# Patient Record
Sex: Male | Born: 1940 | Race: White | Hispanic: No | Marital: Married | Smoking: Former smoker
Health system: Southern US, Community
[De-identification: ages and names within clinical notes are randomized; demographics above are authoritative.]

## PROBLEM LIST (undated history)

## (undated) DIAGNOSIS — I10 Essential (primary) hypertension: Secondary | ICD-10-CM

## (undated) DIAGNOSIS — Z8719 Personal history of other diseases of the digestive system: Secondary | ICD-10-CM

## (undated) DIAGNOSIS — G119 Hereditary ataxia, unspecified: Secondary | ICD-10-CM

## (undated) DIAGNOSIS — Z87442 Personal history of urinary calculi: Secondary | ICD-10-CM

## (undated) HISTORY — PX: TONSILLECTOMY: SUR1361

## (undated) HISTORY — PX: BACK SURGERY: SHX140

## (undated) HISTORY — PX: SHOULDER SURGERY: SHX246

## (undated) HISTORY — PX: FRACTURE SURGERY: SHX138

## (undated) HISTORY — PX: APPENDECTOMY: SHX54

---

## 1983-05-21 HISTORY — PX: KNEE SURGERY: SHX244

## 2001-09-17 ENCOUNTER — Encounter (INDEPENDENT_AMBULATORY_CARE_PROVIDER_SITE_OTHER): Payer: Self-pay | Admitting: Specialist

## 2001-09-17 ENCOUNTER — Ambulatory Visit (HOSPITAL_COMMUNITY): Admission: RE | Admit: 2001-09-17 | Discharge: 2001-09-17 | Payer: Self-pay | Admitting: Gastroenterology

## 2005-06-21 ENCOUNTER — Encounter: Admission: RE | Admit: 2005-06-21 | Discharge: 2005-06-21 | Payer: Self-pay | Admitting: Orthopedic Surgery

## 2009-01-10 ENCOUNTER — Encounter: Admission: RE | Admit: 2009-01-10 | Discharge: 2009-01-10 | Payer: Self-pay | Admitting: Sports Medicine

## 2009-04-28 ENCOUNTER — Inpatient Hospital Stay (HOSPITAL_COMMUNITY): Admission: RE | Admit: 2009-04-28 | Discharge: 2009-05-01 | Payer: Self-pay | Admitting: Neurosurgery

## 2010-08-01 ENCOUNTER — Other Ambulatory Visit: Payer: Self-pay | Admitting: Podiatry

## 2010-08-21 LAB — URINALYSIS, ROUTINE W REFLEX MICROSCOPIC
Bilirubin Urine: NEGATIVE
Bilirubin Urine: NEGATIVE
Glucose, UA: NEGATIVE mg/dL
Glucose, UA: NEGATIVE mg/dL
Hgb urine dipstick: NEGATIVE
Hgb urine dipstick: NEGATIVE
Ketones, ur: NEGATIVE mg/dL
Ketones, ur: NEGATIVE mg/dL
Nitrite: NEGATIVE
Nitrite: NEGATIVE
Protein, ur: NEGATIVE mg/dL
Protein, ur: NEGATIVE mg/dL
Specific Gravity, Urine: 1.023 (ref 1.005–1.030)
Specific Gravity, Urine: 1.023 (ref 1.005–1.030)
Urobilinogen, UA: 0.2 mg/dL (ref 0.0–1.0)
Urobilinogen, UA: 0.2 mg/dL (ref 0.0–1.0)
pH: 5.5 (ref 5.0–8.0)
pH: 7.5 (ref 5.0–8.0)

## 2010-08-21 LAB — PROTIME-INR
INR: 0.98 (ref 0.00–1.49)
Prothrombin Time: 12.9 seconds (ref 11.6–15.2)

## 2010-08-21 LAB — CBC
HCT: 46.2 % (ref 39.0–52.0)
Hemoglobin: 16.3 g/dL (ref 13.0–17.0)
MCHC: 35.3 g/dL (ref 30.0–36.0)
MCV: 96.7 fL (ref 78.0–100.0)
Platelets: 308 10*3/uL (ref 150–400)
RBC: 4.77 MIL/uL (ref 4.22–5.81)
RDW: 12.5 % (ref 11.5–15.5)
WBC: 7.2 10*3/uL (ref 4.0–10.5)

## 2010-08-21 LAB — COMPREHENSIVE METABOLIC PANEL
ALT: 23 U/L (ref 0–53)
AST: 21 U/L (ref 0–37)
Albumin: 4.4 g/dL (ref 3.5–5.2)
Alkaline Phosphatase: 30 U/L — ABNORMAL LOW (ref 39–117)
BUN: 16 mg/dL (ref 6–23)
CO2: 28 mEq/L (ref 19–32)
Calcium: 9.9 mg/dL (ref 8.4–10.5)
Chloride: 104 mEq/L (ref 96–112)
Creatinine, Ser: 0.78 mg/dL (ref 0.4–1.5)
GFR calc Af Amer: >60 mL/min (ref >60)
GFR calc non Af Amer: >60 mL/min (ref >60)
Glucose, Bld: 101 mg/dL — ABNORMAL HIGH (ref 70–99)
Potassium: 4.3 mEq/L (ref 3.5–5.1)
Sodium: 141 mEq/L (ref 135–145)
Total Bilirubin: 0.7 mg/dL (ref 0.3–1.2)
Total Protein: 7 g/dL (ref 6.0–8.3)

## 2010-08-21 LAB — URINE CULTURE
Colony Count: NO GROWTH
Culture: NO GROWTH

## 2010-08-21 LAB — DIFFERENTIAL
Basophils Absolute: 0 10*3/uL (ref 0.0–0.1)
Basophils Relative: 1 % (ref 0–1)
Eosinophils Absolute: 0.1 10*3/uL (ref 0.0–0.7)
Eosinophils Relative: 2 % (ref 0–5)
Lymphocytes Relative: 42 % (ref 12–46)
Lymphs Abs: 3 10*3/uL (ref 0.7–4.0)
Monocytes Absolute: 0.7 10*3/uL (ref 0.1–1.0)
Monocytes Relative: 10 % (ref 3–12)
Neutro Abs: 3.3 10*3/uL (ref 1.7–7.7)
Neutrophils Relative %: 46 % (ref 43–77)

## 2010-08-21 LAB — APTT: aPTT: 27 seconds (ref 24–37)

## 2010-12-24 ENCOUNTER — Emergency Department (HOSPITAL_COMMUNITY): Payer: Medicare Other

## 2010-12-24 ENCOUNTER — Emergency Department (HOSPITAL_COMMUNITY)
Admission: EM | Admit: 2010-12-24 | Discharge: 2010-12-24 | Disposition: A | Discharge status: Home / Self Care | Payer: Medicare Other | Attending: Emergency Medicine | Admitting: Emergency Medicine

## 2010-12-24 DIAGNOSIS — Y93H9 Activity, other involving exterior property and land maintenance, building and construction: Secondary | ICD-10-CM | POA: Insufficient documentation

## 2010-12-24 DIAGNOSIS — M25579 Pain in unspecified ankle and joints of unspecified foot: Secondary | ICD-10-CM | POA: Insufficient documentation

## 2010-12-24 DIAGNOSIS — W010XXA Fall on same level from slipping, tripping and stumbling without subsequent striking against object, initial encounter: Secondary | ICD-10-CM | POA: Insufficient documentation

## 2010-12-24 DIAGNOSIS — Y92009 Unspecified place in unspecified non-institutional (private) residence as the place of occurrence of the external cause: Secondary | ICD-10-CM | POA: Insufficient documentation

## 2010-12-24 DIAGNOSIS — I10 Essential (primary) hypertension: Secondary | ICD-10-CM | POA: Insufficient documentation

## 2010-12-24 DIAGNOSIS — S8263XA Displaced fracture of lateral malleolus of unspecified fibula, initial encounter for closed fracture: Secondary | ICD-10-CM | POA: Insufficient documentation

## 2011-05-22 DIAGNOSIS — M722 Plantar fascial fibromatosis: Secondary | ICD-10-CM | POA: Diagnosis not present

## 2011-05-24 DIAGNOSIS — M722 Plantar fascial fibromatosis: Secondary | ICD-10-CM | POA: Diagnosis not present

## 2011-05-28 DIAGNOSIS — M722 Plantar fascial fibromatosis: Secondary | ICD-10-CM | POA: Diagnosis not present

## 2011-05-30 DIAGNOSIS — M722 Plantar fascial fibromatosis: Secondary | ICD-10-CM | POA: Diagnosis not present

## 2011-06-04 DIAGNOSIS — M722 Plantar fascial fibromatosis: Secondary | ICD-10-CM | POA: Diagnosis not present

## 2011-06-06 DIAGNOSIS — M722 Plantar fascial fibromatosis: Secondary | ICD-10-CM | POA: Diagnosis not present

## 2011-06-11 DIAGNOSIS — M722 Plantar fascial fibromatosis: Secondary | ICD-10-CM | POA: Diagnosis not present

## 2011-06-13 DIAGNOSIS — M722 Plantar fascial fibromatosis: Secondary | ICD-10-CM | POA: Diagnosis not present

## 2011-07-02 DIAGNOSIS — N210 Calculus in bladder: Secondary | ICD-10-CM | POA: Diagnosis not present

## 2011-07-02 DIAGNOSIS — N23 Unspecified renal colic: Secondary | ICD-10-CM | POA: Diagnosis not present

## 2011-07-02 DIAGNOSIS — N200 Calculus of kidney: Secondary | ICD-10-CM | POA: Diagnosis not present

## 2011-07-02 DIAGNOSIS — Z885 Allergy status to narcotic agent status: Secondary | ICD-10-CM | POA: Diagnosis not present

## 2011-07-02 DIAGNOSIS — R3129 Other microscopic hematuria: Secondary | ICD-10-CM | POA: Diagnosis not present

## 2011-07-02 DIAGNOSIS — I10 Essential (primary) hypertension: Secondary | ICD-10-CM | POA: Diagnosis not present

## 2011-07-25 DIAGNOSIS — M25579 Pain in unspecified ankle and joints of unspecified foot: Secondary | ICD-10-CM | POA: Diagnosis not present

## 2011-08-01 DIAGNOSIS — H524 Presbyopia: Secondary | ICD-10-CM | POA: Diagnosis not present

## 2011-08-01 DIAGNOSIS — H5200 Hypermetropia, unspecified eye: Secondary | ICD-10-CM | POA: Diagnosis not present

## 2011-08-01 DIAGNOSIS — H2510 Age-related nuclear cataract, unspecified eye: Secondary | ICD-10-CM | POA: Diagnosis not present

## 2011-08-01 DIAGNOSIS — H52209 Unspecified astigmatism, unspecified eye: Secondary | ICD-10-CM | POA: Diagnosis not present

## 2011-09-30 DIAGNOSIS — M722 Plantar fascial fibromatosis: Secondary | ICD-10-CM | POA: Diagnosis not present

## 2011-10-04 DIAGNOSIS — M722 Plantar fascial fibromatosis: Secondary | ICD-10-CM | POA: Diagnosis not present

## 2011-10-07 DIAGNOSIS — M722 Plantar fascial fibromatosis: Secondary | ICD-10-CM | POA: Diagnosis not present

## 2011-10-21 DIAGNOSIS — M722 Plantar fascial fibromatosis: Secondary | ICD-10-CM | POA: Diagnosis not present

## 2011-10-25 DIAGNOSIS — E785 Hyperlipidemia, unspecified: Secondary | ICD-10-CM | POA: Diagnosis not present

## 2011-10-25 DIAGNOSIS — I10 Essential (primary) hypertension: Secondary | ICD-10-CM | POA: Diagnosis not present

## 2011-10-25 DIAGNOSIS — Z125 Encounter for screening for malignant neoplasm of prostate: Secondary | ICD-10-CM | POA: Diagnosis not present

## 2011-10-28 DIAGNOSIS — M25579 Pain in unspecified ankle and joints of unspecified foot: Secondary | ICD-10-CM | POA: Diagnosis not present

## 2011-11-05 DIAGNOSIS — N138 Other obstructive and reflux uropathy: Secondary | ICD-10-CM | POA: Diagnosis not present

## 2011-11-05 DIAGNOSIS — N401 Enlarged prostate with lower urinary tract symptoms: Secondary | ICD-10-CM | POA: Diagnosis not present

## 2011-11-05 DIAGNOSIS — E785 Hyperlipidemia, unspecified: Secondary | ICD-10-CM | POA: Diagnosis not present

## 2011-11-05 DIAGNOSIS — I10 Essential (primary) hypertension: Secondary | ICD-10-CM | POA: Diagnosis not present

## 2011-11-05 DIAGNOSIS — Z0000 Encounter for general adult medical examination without abnormal findings: Secondary | ICD-10-CM | POA: Diagnosis not present

## 2011-11-11 DIAGNOSIS — S93409A Sprain of unspecified ligament of unspecified ankle, initial encounter: Secondary | ICD-10-CM | POA: Diagnosis not present

## 2011-11-11 DIAGNOSIS — M779 Enthesopathy, unspecified: Secondary | ICD-10-CM | POA: Diagnosis not present

## 2011-11-11 DIAGNOSIS — B351 Tinea unguium: Secondary | ICD-10-CM | POA: Diagnosis not present

## 2011-11-28 DIAGNOSIS — Z1212 Encounter for screening for malignant neoplasm of rectum: Secondary | ICD-10-CM | POA: Diagnosis not present

## 2012-02-10 ENCOUNTER — Other Ambulatory Visit: Payer: Self-pay | Admitting: Dermatology

## 2012-02-10 DIAGNOSIS — D1739 Benign lipomatous neoplasm of skin and subcutaneous tissue of other sites: Secondary | ICD-10-CM | POA: Diagnosis not present

## 2012-02-10 DIAGNOSIS — L570 Actinic keratosis: Secondary | ICD-10-CM | POA: Diagnosis not present

## 2012-02-10 DIAGNOSIS — L439 Lichen planus, unspecified: Secondary | ICD-10-CM | POA: Diagnosis not present

## 2012-02-10 DIAGNOSIS — D485 Neoplasm of uncertain behavior of skin: Secondary | ICD-10-CM | POA: Diagnosis not present

## 2012-02-10 DIAGNOSIS — Z85828 Personal history of other malignant neoplasm of skin: Secondary | ICD-10-CM | POA: Diagnosis not present

## 2012-02-10 DIAGNOSIS — L821 Other seborrheic keratosis: Secondary | ICD-10-CM | POA: Diagnosis not present

## 2012-02-10 DIAGNOSIS — D239 Other benign neoplasm of skin, unspecified: Secondary | ICD-10-CM | POA: Diagnosis not present

## 2012-02-11 DIAGNOSIS — M25519 Pain in unspecified shoulder: Secondary | ICD-10-CM | POA: Diagnosis not present

## 2012-02-19 DIAGNOSIS — Z23 Encounter for immunization: Secondary | ICD-10-CM | POA: Diagnosis not present

## 2012-04-02 DIAGNOSIS — Y831 Surgical operation with implant of artificial internal device as the cause of abnormal reaction of the patient, or of later complication, without mention of misadventure at the time of the procedure: Secondary | ICD-10-CM | POA: Diagnosis not present

## 2012-04-02 DIAGNOSIS — T84498A Other mechanical complication of other internal orthopedic devices, implants and grafts, initial encounter: Secondary | ICD-10-CM | POA: Diagnosis not present

## 2012-04-02 DIAGNOSIS — T8489XA Other specified complication of internal orthopedic prosthetic devices, implants and grafts, initial encounter: Secondary | ICD-10-CM | POA: Diagnosis not present

## 2012-04-27 DIAGNOSIS — G9050 Complex regional pain syndrome I, unspecified: Secondary | ICD-10-CM | POA: Diagnosis not present

## 2012-05-18 DIAGNOSIS — G9050 Complex regional pain syndrome I, unspecified: Secondary | ICD-10-CM | POA: Diagnosis not present

## 2012-05-18 DIAGNOSIS — E785 Hyperlipidemia, unspecified: Secondary | ICD-10-CM | POA: Diagnosis not present

## 2012-05-18 DIAGNOSIS — I10 Essential (primary) hypertension: Secondary | ICD-10-CM | POA: Diagnosis not present

## 2012-05-18 DIAGNOSIS — M1990 Unspecified osteoarthritis, unspecified site: Secondary | ICD-10-CM | POA: Diagnosis not present

## 2012-06-09 DIAGNOSIS — K219 Gastro-esophageal reflux disease without esophagitis: Secondary | ICD-10-CM | POA: Diagnosis not present

## 2012-06-12 DIAGNOSIS — G5760 Lesion of plantar nerve, unspecified lower limb: Secondary | ICD-10-CM | POA: Diagnosis not present

## 2012-06-12 DIAGNOSIS — IMO0002 Reserved for concepts with insufficient information to code with codable children: Secondary | ICD-10-CM | POA: Diagnosis not present

## 2012-07-23 DIAGNOSIS — G5790 Unspecified mononeuropathy of unspecified lower limb: Secondary | ICD-10-CM | POA: Diagnosis not present

## 2012-07-23 DIAGNOSIS — G5730 Lesion of lateral popliteal nerve, unspecified lower limb: Secondary | ICD-10-CM | POA: Diagnosis not present

## 2012-09-08 DIAGNOSIS — H524 Presbyopia: Secondary | ICD-10-CM | POA: Diagnosis not present

## 2012-09-08 DIAGNOSIS — Z961 Presence of intraocular lens: Secondary | ICD-10-CM | POA: Diagnosis not present

## 2012-11-03 DIAGNOSIS — M899 Disorder of bone, unspecified: Secondary | ICD-10-CM | POA: Diagnosis not present

## 2012-11-03 DIAGNOSIS — E785 Hyperlipidemia, unspecified: Secondary | ICD-10-CM | POA: Diagnosis not present

## 2012-11-03 DIAGNOSIS — Z125 Encounter for screening for malignant neoplasm of prostate: Secondary | ICD-10-CM | POA: Diagnosis not present

## 2012-11-03 DIAGNOSIS — I10 Essential (primary) hypertension: Secondary | ICD-10-CM | POA: Diagnosis not present

## 2012-11-03 DIAGNOSIS — M949 Disorder of cartilage, unspecified: Secondary | ICD-10-CM | POA: Diagnosis not present

## 2012-11-10 DIAGNOSIS — M899 Disorder of bone, unspecified: Secondary | ICD-10-CM | POA: Diagnosis not present

## 2012-11-10 DIAGNOSIS — Z125 Encounter for screening for malignant neoplasm of prostate: Secondary | ICD-10-CM | POA: Diagnosis not present

## 2012-11-10 DIAGNOSIS — Z0000 Encounter for general adult medical examination without abnormal findings: Secondary | ICD-10-CM | POA: Diagnosis not present

## 2012-11-10 DIAGNOSIS — G4700 Insomnia, unspecified: Secondary | ICD-10-CM | POA: Diagnosis not present

## 2012-11-10 DIAGNOSIS — I10 Essential (primary) hypertension: Secondary | ICD-10-CM | POA: Diagnosis not present

## 2012-11-10 DIAGNOSIS — D126 Benign neoplasm of colon, unspecified: Secondary | ICD-10-CM | POA: Diagnosis not present

## 2012-11-10 DIAGNOSIS — G9050 Complex regional pain syndrome I, unspecified: Secondary | ICD-10-CM | POA: Diagnosis not present

## 2012-11-10 DIAGNOSIS — E785 Hyperlipidemia, unspecified: Secondary | ICD-10-CM | POA: Diagnosis not present

## 2012-11-10 DIAGNOSIS — K219 Gastro-esophageal reflux disease without esophagitis: Secondary | ICD-10-CM | POA: Diagnosis not present

## 2012-11-17 DIAGNOSIS — Z1212 Encounter for screening for malignant neoplasm of rectum: Secondary | ICD-10-CM | POA: Diagnosis not present

## 2012-11-25 ENCOUNTER — Other Ambulatory Visit: Payer: Self-pay | Admitting: Dermatology

## 2012-11-25 DIAGNOSIS — L820 Inflamed seborrheic keratosis: Secondary | ICD-10-CM | POA: Diagnosis not present

## 2012-11-25 DIAGNOSIS — L821 Other seborrheic keratosis: Secondary | ICD-10-CM | POA: Diagnosis not present

## 2012-11-25 DIAGNOSIS — L570 Actinic keratosis: Secondary | ICD-10-CM | POA: Diagnosis not present

## 2012-11-25 DIAGNOSIS — D485 Neoplasm of uncertain behavior of skin: Secondary | ICD-10-CM | POA: Diagnosis not present

## 2012-11-25 DIAGNOSIS — D235 Other benign neoplasm of skin of trunk: Secondary | ICD-10-CM | POA: Diagnosis not present

## 2012-12-14 DIAGNOSIS — M25519 Pain in unspecified shoulder: Secondary | ICD-10-CM | POA: Diagnosis not present

## 2012-12-14 DIAGNOSIS — IMO0002 Reserved for concepts with insufficient information to code with codable children: Secondary | ICD-10-CM | POA: Diagnosis not present

## 2013-01-07 DIAGNOSIS — M25469 Effusion, unspecified knee: Secondary | ICD-10-CM | POA: Diagnosis not present

## 2013-01-07 DIAGNOSIS — M25569 Pain in unspecified knee: Secondary | ICD-10-CM | POA: Diagnosis not present

## 2013-01-11 DIAGNOSIS — K219 Gastro-esophageal reflux disease without esophagitis: Secondary | ICD-10-CM | POA: Diagnosis not present

## 2013-02-27 DIAGNOSIS — Z23 Encounter for immunization: Secondary | ICD-10-CM | POA: Diagnosis not present

## 2013-04-01 DIAGNOSIS — S8410XA Injury of peroneal nerve at lower leg level, unspecified leg, initial encounter: Secondary | ICD-10-CM | POA: Diagnosis not present

## 2013-04-09 DIAGNOSIS — R197 Diarrhea, unspecified: Secondary | ICD-10-CM | POA: Diagnosis not present

## 2013-05-04 DIAGNOSIS — R197 Diarrhea, unspecified: Secondary | ICD-10-CM | POA: Diagnosis not present

## 2013-05-04 DIAGNOSIS — K219 Gastro-esophageal reflux disease without esophagitis: Secondary | ICD-10-CM | POA: Diagnosis not present

## 2013-05-11 DIAGNOSIS — N138 Other obstructive and reflux uropathy: Secondary | ICD-10-CM | POA: Diagnosis not present

## 2013-05-11 DIAGNOSIS — E785 Hyperlipidemia, unspecified: Secondary | ICD-10-CM | POA: Diagnosis not present

## 2013-05-11 DIAGNOSIS — M899 Disorder of bone, unspecified: Secondary | ICD-10-CM | POA: Diagnosis not present

## 2013-05-11 DIAGNOSIS — M1990 Unspecified osteoarthritis, unspecified site: Secondary | ICD-10-CM | POA: Diagnosis not present

## 2013-05-11 DIAGNOSIS — G4700 Insomnia, unspecified: Secondary | ICD-10-CM | POA: Diagnosis not present

## 2013-05-11 DIAGNOSIS — N401 Enlarged prostate with lower urinary tract symptoms: Secondary | ICD-10-CM | POA: Diagnosis not present

## 2013-05-11 DIAGNOSIS — Z1331 Encounter for screening for depression: Secondary | ICD-10-CM | POA: Diagnosis not present

## 2013-05-11 DIAGNOSIS — K219 Gastro-esophageal reflux disease without esophagitis: Secondary | ICD-10-CM | POA: Diagnosis not present

## 2013-05-11 DIAGNOSIS — I10 Essential (primary) hypertension: Secondary | ICD-10-CM | POA: Diagnosis not present

## 2013-05-11 DIAGNOSIS — D126 Benign neoplasm of colon, unspecified: Secondary | ICD-10-CM | POA: Diagnosis not present

## 2013-05-12 ENCOUNTER — Ambulatory Visit: Payer: Self-pay | Admitting: Podiatry

## 2013-05-21 DIAGNOSIS — H0019 Chalazion unspecified eye, unspecified eyelid: Secondary | ICD-10-CM | POA: Diagnosis not present

## 2013-07-20 DIAGNOSIS — L739 Follicular disorder, unspecified: Secondary | ICD-10-CM | POA: Diagnosis not present

## 2013-07-20 DIAGNOSIS — L570 Actinic keratosis: Secondary | ICD-10-CM | POA: Diagnosis not present

## 2013-07-20 DIAGNOSIS — L821 Other seborrheic keratosis: Secondary | ICD-10-CM | POA: Diagnosis not present

## 2013-07-20 DIAGNOSIS — D239 Other benign neoplasm of skin, unspecified: Secondary | ICD-10-CM | POA: Diagnosis not present

## 2013-07-27 DIAGNOSIS — K219 Gastro-esophageal reflux disease without esophagitis: Secondary | ICD-10-CM | POA: Diagnosis not present

## 2013-07-27 DIAGNOSIS — R197 Diarrhea, unspecified: Secondary | ICD-10-CM | POA: Diagnosis not present

## 2013-08-04 DIAGNOSIS — M19079 Primary osteoarthritis, unspecified ankle and foot: Secondary | ICD-10-CM | POA: Diagnosis not present

## 2013-08-04 DIAGNOSIS — M25579 Pain in unspecified ankle and joints of unspecified foot: Secondary | ICD-10-CM | POA: Diagnosis not present

## 2013-10-25 DIAGNOSIS — M19079 Primary osteoarthritis, unspecified ankle and foot: Secondary | ICD-10-CM | POA: Diagnosis not present

## 2013-10-25 DIAGNOSIS — K219 Gastro-esophageal reflux disease without esophagitis: Secondary | ICD-10-CM | POA: Diagnosis not present

## 2013-10-25 DIAGNOSIS — G589 Mononeuropathy, unspecified: Secondary | ICD-10-CM | POA: Diagnosis not present

## 2013-10-25 DIAGNOSIS — I10 Essential (primary) hypertension: Secondary | ICD-10-CM | POA: Diagnosis not present

## 2013-10-26 DIAGNOSIS — M79609 Pain in unspecified limb: Secondary | ICD-10-CM | POA: Diagnosis not present

## 2013-10-26 DIAGNOSIS — R209 Unspecified disturbances of skin sensation: Secondary | ICD-10-CM | POA: Diagnosis not present

## 2013-10-26 DIAGNOSIS — G5790 Unspecified mononeuropathy of unspecified lower limb: Secondary | ICD-10-CM | POA: Diagnosis not present

## 2013-12-01 DIAGNOSIS — M899 Disorder of bone, unspecified: Secondary | ICD-10-CM | POA: Diagnosis not present

## 2013-12-01 DIAGNOSIS — I10 Essential (primary) hypertension: Secondary | ICD-10-CM | POA: Diagnosis not present

## 2013-12-01 DIAGNOSIS — Z125 Encounter for screening for malignant neoplasm of prostate: Secondary | ICD-10-CM | POA: Diagnosis not present

## 2013-12-01 DIAGNOSIS — E785 Hyperlipidemia, unspecified: Secondary | ICD-10-CM | POA: Diagnosis not present

## 2013-12-01 DIAGNOSIS — M949 Disorder of cartilage, unspecified: Secondary | ICD-10-CM | POA: Diagnosis not present

## 2013-12-08 DIAGNOSIS — M949 Disorder of cartilage, unspecified: Secondary | ICD-10-CM | POA: Diagnosis not present

## 2013-12-08 DIAGNOSIS — M899 Disorder of bone, unspecified: Secondary | ICD-10-CM | POA: Diagnosis not present

## 2013-12-08 DIAGNOSIS — I10 Essential (primary) hypertension: Secondary | ICD-10-CM | POA: Diagnosis not present

## 2013-12-08 DIAGNOSIS — E785 Hyperlipidemia, unspecified: Secondary | ICD-10-CM | POA: Diagnosis not present

## 2013-12-08 DIAGNOSIS — K219 Gastro-esophageal reflux disease without esophagitis: Secondary | ICD-10-CM | POA: Diagnosis not present

## 2013-12-08 DIAGNOSIS — Z0000 Encounter for general adult medical examination without abnormal findings: Secondary | ICD-10-CM | POA: Diagnosis not present

## 2013-12-08 DIAGNOSIS — Z23 Encounter for immunization: Secondary | ICD-10-CM | POA: Diagnosis not present

## 2013-12-08 DIAGNOSIS — M1990 Unspecified osteoarthritis, unspecified site: Secondary | ICD-10-CM | POA: Diagnosis not present

## 2013-12-08 DIAGNOSIS — G9050 Complex regional pain syndrome I, unspecified: Secondary | ICD-10-CM | POA: Diagnosis not present

## 2014-01-19 DIAGNOSIS — M25579 Pain in unspecified ankle and joints of unspecified foot: Secondary | ICD-10-CM | POA: Diagnosis not present

## 2014-01-31 DIAGNOSIS — M25579 Pain in unspecified ankle and joints of unspecified foot: Secondary | ICD-10-CM | POA: Diagnosis not present

## 2014-02-01 ENCOUNTER — Ambulatory Visit (INDEPENDENT_AMBULATORY_CARE_PROVIDER_SITE_OTHER): Payer: Medicare Other | Admitting: Surgery

## 2014-02-23 ENCOUNTER — Other Ambulatory Visit (INDEPENDENT_AMBULATORY_CARE_PROVIDER_SITE_OTHER): Payer: Self-pay | Admitting: Surgery

## 2014-02-23 DIAGNOSIS — L290 Pruritus ani: Secondary | ICD-10-CM | POA: Diagnosis not present

## 2014-02-23 DIAGNOSIS — K648 Other hemorrhoids: Secondary | ICD-10-CM | POA: Diagnosis not present

## 2014-02-23 NOTE — H&P (Signed)
Derrick White 02/23/2014 11:37 AM Location: [facility] Patient #: [id number] DOB: 04-Jan-1941 Married / Language: [name] / Race: White Male  History of Present Illness [name] MD; 02/23/2014 12:11 PM) Patient words: Eval internal hems/ self referred.  The patient is a 73 year old male who presents with hemorrhoids. Pleasant talkative man that is struggled with hemorrhoids for many years. Has mild diarrhea with 3-4 loose bowel movements a day. Not on any bowel regimen. Has had numerous Band-Aids done by his gastroenterologist. Still has a persistent hemorrhoid. Resisted having surgery given reports of pain after surgery. However, getting the point where it is chronically underlying. Some bleeding. Irritation as well. No ulcerative colitis. No inflammatory bowel disease. Colonoscopy 2011 no other abnormalities. On proton pump inhibitor for mild reflux. Mild diverticulosis. Claims he said 7 Band-Aids done and still struggles with hemorrhoid problems.   Other Problems Lars Mage Spillers, MA; 02/23/2014 11:39 AM) High blood pressure  Past Surgical History Lars Mage Spillers, MA; 02/23/2014 11:39 AM) Colon Polyp Removal - Colonoscopy Oral Surgery  Allergies (Alisha Spillers, MA; 02/23/2014 11:46 AM) Morphine Sulfate (Concentrate) *ANALGESICS - OPIOID* Codeine Phosphate *ANALGESICS - OPIOID*  Medication History (Alisha Spillers, MA; 02/23/2014 11:46 AM) Pantoprazole Sodium (40MG  Tablet DR, Oral daily) Active. Pravastatin Sodium (40MG  Tablet, Oral daily) Active. Valsartan (320MG  Tablet, Oral daily) Active. Medications Reconciled  Vitals (Alisha Spillers MA; 02/23/2014 11:41 AM) 02/23/2014 11:40 AM Weight: 176.6 lb Height: 70in Body Surface Area: 1.99 m Body Mass Index: 25.34 kg/m Temp.: 97.53F(Oral)  Pulse: 68 (Regular)  BP: 102/60 (Sitting, Left Arm, Standard)    Physical Exam [name] MD; 02/23/2014 11:59 AM) General Mental  Status-Alert. General Appearance-Not in acute distress, Not Sickly. Orientation-Oriented X3. Hydration-Well hydrated. Voice-Normal.  Integumentary Global Assessment Upon inspection and palpation of skin surfaces of the - Axillae: non-tender, no inflammation or ulceration, no drainage. and Distribution of scalp and body hair is normal. General Characteristics Temperature - normal warmth is noted.  Head and Neck Head-normocephalic, atraumatic with no lesions or palpable masses. Face Global Assessment - atraumatic, no absence of expression. Neck Global Assessment - no abnormal movements, no bruit auscultated on the right, no bruit auscultated on the left, no decreased range of motion, non-tender. Trachea-midline. Thyroid Gland Characteristics - non-tender.  Eye Eyeball - Left-Extraocular movements intact, No Nystagmus. Eyeball - Right-Extraocular movements intact, No Nystagmus. Cornea - Left-No Hazy. Cornea - Right-No Hazy. Sclera/Conjunctiva - Left-No scleral icterus, No Discharge. Sclera/Conjunctiva - Right-No scleral icterus, No Discharge. Pupil - Left-Direct reaction to light normal. Pupil - Right-Direct reaction to light normal.  ENMT Ears Pinna - Left - no drainage observed, no generalized tenderness observed. Right - no drainage observed, no generalized tenderness observed. Nose and Sinuses External Inspection of the Nose - no destructive lesion observed. Inspection of the nares - Left - quiet respiration. Right - quiet respiration. Mouth and Throat Lips - Upper Lip - no fissures observed, no pallor noted. Lower Lip - no fissures observed, no pallor noted. Nasopharynx - no discharge present. Oral Cavity/Oropharynx - Tongue - no dryness observed. Oral Mucosa - no cyanosis observed. Hypopharynx - no evidence of airway distress observed.  Chest and Lung Exam Inspection Movements - Normal and Symmetrical. Accessory muscles - No use of accessory  muscles in breathing. Palpation Palpation of the chest reveals - Non-tender. Auscultation Breath sounds - Normal and Clear.  Cardiovascular Auscultation Rhythm - Regular. Murmurs & Other Heart Sounds - Auscultation of the heart reveals - No Murmurs and No Systolic  Clicks.  Abdomen Inspection Inspection of the abdomen reveals - No Visible peristalsis and No Abnormal pulsations. Umbilicus - No Bleeding, No Urine drainage. Palpation/Percussion Palpation and Percussion of the abdomen reveal - Soft, Non Tender, No Rebound tenderness, No Rigidity (guarding) and No Cutaneous hyperesthesia.  Male Genitourinary Sexual Maturity Tanner 5 - Adult hair pattern. Glans-Normal. Foreskin-Normal. Penis-Normal.  Rectal Anorectal Exam External - generalized tenderness, external hemorrhoids, rash noted on exam and ulcerated, no anal fissures, no anorectal fistula, no pilonidal cysts, no pilonidal sinuses, no warts . Note: Surrounding erythema with whitish scarring/ulceration consistent with chronic pruritus ani.  Obvious pedunculated internal/external hemorrhoid with long stalk. Very sensitive at base. Tolerates digital rectal exam. Sphincter sensitive but mostly normal tone. Mildly enlarged internal hemorrhoidal piles. Endoscopy not attempted given sensitivity.   Peripheral Vascular Upper Extremity Inspection - Left - No Cyanotic nailbeds, Not Ischemic. Right - No Cyanotic nailbeds, Not Ischemic.  Neurologic Neurologic evaluation reveals -normal attention span and ability to concentrate, able to name objects and repeat phrases. Appropriate fund of knowledge , normal sensation and normal coordination. Mental Status Affect - not angry, not paranoid. Cranial Nerves-Normal Bilaterally. Gait-Normal.  Neuropsychiatric Mental status exam performed with findings of-able to articulate well with normal speech/language, rate, volume and coherence, thought content normal with ability to  perform basic computations and apply abstract reasoning and no evidence of hallucinations, delusions, obsessions or homicidal/suicidal ideation.  Musculoskeletal Global Assessment Spine, Ribs and Pelvis - no instability, subluxation or laxity. Right Upper Extremity - no instability, subluxation or laxity.  Lymphatic Head & Neck  General Head & Neck Lymphatics: Bilateral - Description - No Localized lymphadenopathy. Axillary  General Axillary Region: Bilateral - Description - No Localized lymphadenopathy. Femoral & Inguinal  Generalized Femoral & Inguinal Lymphatics: Left - Description - No Localized lymphadenopathy. Right - Description - No Localized lymphadenopathy.    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 I strongly recommended surgery to remove the prolapsed hemorrhoid. Also possible more aggressive ligation of any persistent internal hemorrhoids one seen. It should be outpatient surgery. I did caution him he will have moderate pain the first week or so prevention finally get better. He is interested in proceeding:  The anatomy & physiology of the anorectal region was discussed. The pathophysiology of hemorrhoids and differential diagnosis was discussed. Natural history risks without surgery was discussed. I stressed the importance of a bowel regimen to have daily soft bowel movements to minimize progression of disease. Interventions such as sclerotherapy & banding were discussed.  The patient's symptoms are not adequately controlled by medicines and other non-operative treatments. I feel the risks & problems of no surgery outweigh the operative risks; therefore, I recommended surgery to treat the hemorrhoids by ligation, pexy, and possible  resection.  Risks such as bleeding, infection, urinary difficulties, need for further treatment, heart attack, death, and other risks were discussed. I noted a good likelihood this will help address the problem. Goals of post-operative recovery were discussed as well. Possibility that this will not correct all symptoms was explained. Post-operative pain, bleeding, constipation, and other problems after surgery were discussed. We will work to minimize complications. Educational handouts further explaining the pathology, treatment options, and bowel regimen were given as well. Questions were answered. The patient expresses understanding & wishes to proceed  with surgery. Pt Education - [facility] Hemorrhoids (Aleksia Freiman) Pt Education - [facility] Good Bowel Health (Jamion Carter) Pt Education - [facility] Pain Control (Kerriann Kamphuis) Pt Education - [facility] Rectal Surgery HCI (Real Cona): discussed with patient and provided information. 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 Should improve with excision of the prolapsing tissue. Also gave handout on management using wet wipes hypoallergenic silks and keeping the area clean/dry. Avoid itching.    Signed by [name], MD (02/23/2014 12:11 PM)

## 2014-03-02 DIAGNOSIS — Z23 Encounter for immunization: Secondary | ICD-10-CM | POA: Diagnosis not present

## 2014-03-22 DIAGNOSIS — D225 Melanocytic nevi of trunk: Secondary | ICD-10-CM | POA: Diagnosis not present

## 2014-03-22 DIAGNOSIS — L821 Other seborrheic keratosis: Secondary | ICD-10-CM | POA: Diagnosis not present

## 2014-03-22 DIAGNOSIS — L570 Actinic keratosis: Secondary | ICD-10-CM | POA: Diagnosis not present

## 2014-03-28 DIAGNOSIS — H25011 Cortical age-related cataract, right eye: Secondary | ICD-10-CM | POA: Diagnosis not present

## 2014-03-28 DIAGNOSIS — H01001 Unspecified blepharitis right upper eyelid: Secondary | ICD-10-CM | POA: Diagnosis not present

## 2014-03-28 DIAGNOSIS — H52203 Unspecified astigmatism, bilateral: Secondary | ICD-10-CM | POA: Diagnosis not present

## 2014-03-28 DIAGNOSIS — H01004 Unspecified blepharitis left upper eyelid: Secondary | ICD-10-CM | POA: Diagnosis not present

## 2014-06-06 DIAGNOSIS — G4700 Insomnia, unspecified: Secondary | ICD-10-CM | POA: Diagnosis not present

## 2014-06-06 DIAGNOSIS — M1990 Unspecified osteoarthritis, unspecified site: Secondary | ICD-10-CM | POA: Diagnosis not present

## 2014-06-06 DIAGNOSIS — M8580 Other specified disorders of bone density and structure, unspecified site: Secondary | ICD-10-CM | POA: Diagnosis not present

## 2014-06-06 DIAGNOSIS — K219 Gastro-esophageal reflux disease without esophagitis: Secondary | ICD-10-CM | POA: Diagnosis not present

## 2014-06-06 DIAGNOSIS — E785 Hyperlipidemia, unspecified: Secondary | ICD-10-CM | POA: Diagnosis not present

## 2014-06-06 DIAGNOSIS — Z1389 Encounter for screening for other disorder: Secondary | ICD-10-CM | POA: Diagnosis not present

## 2014-06-06 DIAGNOSIS — N401 Enlarged prostate with lower urinary tract symptoms: Secondary | ICD-10-CM | POA: Diagnosis not present

## 2014-06-06 DIAGNOSIS — Z6826 Body mass index (BMI) 26.0-26.9, adult: Secondary | ICD-10-CM | POA: Diagnosis not present

## 2014-06-06 DIAGNOSIS — I10 Essential (primary) hypertension: Secondary | ICD-10-CM | POA: Diagnosis not present

## 2014-06-06 DIAGNOSIS — G9050 Complex regional pain syndrome I, unspecified: Secondary | ICD-10-CM | POA: Diagnosis not present

## 2014-10-10 DIAGNOSIS — M47817 Spondylosis without myelopathy or radiculopathy, lumbosacral region: Secondary | ICD-10-CM | POA: Diagnosis not present

## 2014-10-10 DIAGNOSIS — M545 Low back pain: Secondary | ICD-10-CM | POA: Diagnosis not present

## 2014-11-25 ENCOUNTER — Other Ambulatory Visit: Payer: Self-pay | Admitting: Orthopaedic Surgery

## 2014-11-25 DIAGNOSIS — M545 Low back pain: Secondary | ICD-10-CM

## 2014-11-29 DIAGNOSIS — L821 Other seborrheic keratosis: Secondary | ICD-10-CM | POA: Diagnosis not present

## 2014-11-29 DIAGNOSIS — L814 Other melanin hyperpigmentation: Secondary | ICD-10-CM | POA: Diagnosis not present

## 2014-11-29 DIAGNOSIS — L218 Other seborrheic dermatitis: Secondary | ICD-10-CM | POA: Diagnosis not present

## 2014-11-29 DIAGNOSIS — D225 Melanocytic nevi of trunk: Secondary | ICD-10-CM | POA: Diagnosis not present

## 2014-11-29 DIAGNOSIS — L570 Actinic keratosis: Secondary | ICD-10-CM | POA: Diagnosis not present

## 2014-11-29 DIAGNOSIS — D170 Benign lipomatous neoplasm of skin and subcutaneous tissue of head, face and neck: Secondary | ICD-10-CM | POA: Diagnosis not present

## 2014-12-13 DIAGNOSIS — M859 Disorder of bone density and structure, unspecified: Secondary | ICD-10-CM | POA: Diagnosis not present

## 2014-12-13 DIAGNOSIS — E785 Hyperlipidemia, unspecified: Secondary | ICD-10-CM | POA: Diagnosis not present

## 2014-12-13 DIAGNOSIS — I10 Essential (primary) hypertension: Secondary | ICD-10-CM | POA: Diagnosis not present

## 2014-12-13 DIAGNOSIS — Z125 Encounter for screening for malignant neoplasm of prostate: Secondary | ICD-10-CM | POA: Diagnosis not present

## 2014-12-19 DIAGNOSIS — E785 Hyperlipidemia, unspecified: Secondary | ICD-10-CM | POA: Diagnosis not present

## 2014-12-19 DIAGNOSIS — K219 Gastro-esophageal reflux disease without esophagitis: Secondary | ICD-10-CM | POA: Diagnosis not present

## 2014-12-19 DIAGNOSIS — N401 Enlarged prostate with lower urinary tract symptoms: Secondary | ICD-10-CM | POA: Diagnosis not present

## 2014-12-19 DIAGNOSIS — D126 Benign neoplasm of colon, unspecified: Secondary | ICD-10-CM | POA: Diagnosis not present

## 2014-12-19 DIAGNOSIS — Z6826 Body mass index (BMI) 26.0-26.9, adult: Secondary | ICD-10-CM | POA: Diagnosis not present

## 2014-12-19 DIAGNOSIS — G9050 Complex regional pain syndrome I, unspecified: Secondary | ICD-10-CM | POA: Diagnosis not present

## 2014-12-19 DIAGNOSIS — G4700 Insomnia, unspecified: Secondary | ICD-10-CM | POA: Diagnosis not present

## 2014-12-19 DIAGNOSIS — I10 Essential (primary) hypertension: Secondary | ICD-10-CM | POA: Diagnosis not present

## 2014-12-19 DIAGNOSIS — M859 Disorder of bone density and structure, unspecified: Secondary | ICD-10-CM | POA: Diagnosis not present

## 2014-12-19 DIAGNOSIS — M1990 Unspecified osteoarthritis, unspecified site: Secondary | ICD-10-CM | POA: Diagnosis not present

## 2014-12-19 DIAGNOSIS — Z1389 Encounter for screening for other disorder: Secondary | ICD-10-CM | POA: Diagnosis not present

## 2014-12-19 DIAGNOSIS — Z0000 Encounter for general adult medical examination without abnormal findings: Secondary | ICD-10-CM | POA: Diagnosis not present

## 2014-12-26 DIAGNOSIS — Z1212 Encounter for screening for malignant neoplasm of rectum: Secondary | ICD-10-CM | POA: Diagnosis not present

## 2015-01-10 DIAGNOSIS — K219 Gastro-esophageal reflux disease without esophagitis: Secondary | ICD-10-CM | POA: Diagnosis not present

## 2015-01-24 DIAGNOSIS — K644 Residual hemorrhoidal skin tags: Secondary | ICD-10-CM | POA: Diagnosis not present

## 2015-01-24 DIAGNOSIS — L539 Erythematous condition, unspecified: Secondary | ICD-10-CM | POA: Diagnosis not present

## 2015-01-25 DIAGNOSIS — Z6826 Body mass index (BMI) 26.0-26.9, adult: Secondary | ICD-10-CM | POA: Diagnosis not present

## 2015-01-25 DIAGNOSIS — B372 Candidiasis of skin and nail: Secondary | ICD-10-CM | POA: Diagnosis not present

## 2015-02-22 ENCOUNTER — Ambulatory Visit (INDEPENDENT_AMBULATORY_CARE_PROVIDER_SITE_OTHER): Payer: Medicare Other | Admitting: Podiatry

## 2015-02-22 ENCOUNTER — Ambulatory Visit (INDEPENDENT_AMBULATORY_CARE_PROVIDER_SITE_OTHER): Payer: Medicare Other

## 2015-02-22 ENCOUNTER — Encounter: Payer: Self-pay | Admitting: Podiatry

## 2015-02-22 VITALS — BP 126/77 | HR 58 | Resp 16 | Ht 70.0 in | Wt 176.0 lb

## 2015-02-22 DIAGNOSIS — M79675 Pain in left toe(s): Secondary | ICD-10-CM

## 2015-02-22 DIAGNOSIS — G629 Polyneuropathy, unspecified: Secondary | ICD-10-CM | POA: Diagnosis not present

## 2015-02-22 DIAGNOSIS — M779 Enthesopathy, unspecified: Secondary | ICD-10-CM | POA: Diagnosis not present

## 2015-02-22 MED ORDER — TRIAMCINOLONE ACETONIDE 10 MG/ML IJ SUSP
10.0000 mg | Freq: Once | INTRAMUSCULAR | Status: AC
  Administered 2015-02-22: 10 mg

## 2015-02-22 NOTE — Progress Notes (Signed)
Subjective:     Patient ID: Derrick White, male   DOB: 1940-09-04, 74 y.o.   MRN: [id number]  HPI patient presents stating I have a Black fifth nail and also a lot of pain on top of my foot and on the outside were I have had a broken ankle and ended up with a nerve entrapment   Review of Systems  All other systems reviewed and are negative.      Objective:   Physical Exam  Constitutional: He is oriented to person, place, and time.  Cardiovascular: Intact distal pulses.   Musculoskeletal: Normal range of motion.  Neurological: He is oriented to person, place, and time.  Skin: Skin is warm.  Nursing note and vitals reviewed.  neurovascular status intact muscle strength adequate range of motion within normal limits with scar on the left ankle and some reduction of motion inversion eversion of the right subtalar joint. Patient's noted to have quite a bit of discomfort in the sinus tarsi left and moderate discomfort lateral side of the foot but no current pain where the ulnar nerve was released even though he thinks the problem may be  related to that. Patient is noted to have good digital perfusion is well oriented 3     Assessment:     Previous injury a 5 years ago with possible inflammation versus nerve compression and difficulty to determine between the 2 different things that may be occurring. Damage fifth nail which appears to be strictly just dried blood which I conveyed to patient    Plan:     H&P and condition reviewed with patient. Patient had a sinus tarsi injection administered and we will see how he responds to this and decide if there's any other way that we can be of help to him

## 2015-02-22 NOTE — Progress Notes (Signed)
   Subjective:    Patient ID: Derrick White, male    DOB: 1941-01-03, 74 y.o.   MRN: [id number]  HPI Patient presents with toe pain in their Left foot, pinky toe. Nail looks black. This has been going on for the past 2 years.   Review of Systems  HENT: Positive for hearing loss.   Eyes: Positive for visual disturbance.  Gastrointestinal: Positive for diarrhea.  All other systems reviewed and are negative.      Objective:   Physical Exam        Assessment & Plan:

## 2015-03-08 ENCOUNTER — Ambulatory Visit: Payer: Medicare Other | Admitting: Podiatry

## 2015-03-17 DIAGNOSIS — Z87891 Personal history of nicotine dependence: Secondary | ICD-10-CM | POA: Diagnosis not present

## 2015-03-17 DIAGNOSIS — I10 Essential (primary) hypertension: Secondary | ICD-10-CM | POA: Diagnosis not present

## 2015-03-17 DIAGNOSIS — Z981 Arthrodesis status: Secondary | ICD-10-CM | POA: Diagnosis not present

## 2015-03-17 DIAGNOSIS — Z79899 Other long term (current) drug therapy: Secondary | ICD-10-CM | POA: Diagnosis not present

## 2015-03-17 DIAGNOSIS — D129 Benign neoplasm of anus and anal canal: Secondary | ICD-10-CM | POA: Diagnosis not present

## 2015-03-17 DIAGNOSIS — K644 Residual hemorrhoidal skin tags: Secondary | ICD-10-CM | POA: Diagnosis not present

## 2015-03-17 DIAGNOSIS — E7800 Pure hypercholesterolemia, unspecified: Secondary | ICD-10-CM | POA: Diagnosis not present

## 2015-03-17 DIAGNOSIS — Z885 Allergy status to narcotic agent status: Secondary | ICD-10-CM | POA: Diagnosis not present

## 2015-03-22 DIAGNOSIS — Z23 Encounter for immunization: Secondary | ICD-10-CM | POA: Diagnosis not present

## 2015-04-03 DIAGNOSIS — H5203 Hypermetropia, bilateral: Secondary | ICD-10-CM | POA: Diagnosis not present

## 2015-04-03 DIAGNOSIS — H2513 Age-related nuclear cataract, bilateral: Secondary | ICD-10-CM | POA: Diagnosis not present

## 2015-04-03 DIAGNOSIS — H25011 Cortical age-related cataract, right eye: Secondary | ICD-10-CM | POA: Diagnosis not present

## 2015-04-03 DIAGNOSIS — H524 Presbyopia: Secondary | ICD-10-CM | POA: Diagnosis not present

## 2015-05-24 DIAGNOSIS — G5732 Lesion of lateral popliteal nerve, left lower limb: Secondary | ICD-10-CM | POA: Diagnosis not present

## 2015-07-17 DIAGNOSIS — G5762 Lesion of plantar nerve, left lower limb: Secondary | ICD-10-CM | POA: Diagnosis not present

## 2015-07-18 DIAGNOSIS — G5762 Lesion of plantar nerve, left lower limb: Secondary | ICD-10-CM | POA: Diagnosis not present

## 2015-07-18 DIAGNOSIS — G5732 Lesion of lateral popliteal nerve, left lower limb: Secondary | ICD-10-CM | POA: Diagnosis not present

## 2015-07-18 DIAGNOSIS — Z79899 Other long term (current) drug therapy: Secondary | ICD-10-CM | POA: Diagnosis not present

## 2015-07-18 DIAGNOSIS — G5782 Other specified mononeuropathies of left lower limb: Secondary | ICD-10-CM | POA: Diagnosis not present

## 2015-07-18 DIAGNOSIS — Z87891 Personal history of nicotine dependence: Secondary | ICD-10-CM | POA: Diagnosis not present

## 2015-07-18 DIAGNOSIS — D3613 Benign neoplasm of peripheral nerves and autonomic nervous system of lower limb, including hip: Secondary | ICD-10-CM | POA: Diagnosis not present

## 2015-07-18 DIAGNOSIS — G5792 Unspecified mononeuropathy of left lower limb: Secondary | ICD-10-CM | POA: Diagnosis not present

## 2015-07-18 DIAGNOSIS — I10 Essential (primary) hypertension: Secondary | ICD-10-CM | POA: Diagnosis not present

## 2015-07-24 DIAGNOSIS — M859 Disorder of bone density and structure, unspecified: Secondary | ICD-10-CM | POA: Diagnosis not present

## 2015-07-24 DIAGNOSIS — G9050 Complex regional pain syndrome I, unspecified: Secondary | ICD-10-CM | POA: Diagnosis not present

## 2015-07-24 DIAGNOSIS — E7800 Pure hypercholesterolemia, unspecified: Secondary | ICD-10-CM | POA: Diagnosis not present

## 2015-07-24 DIAGNOSIS — R252 Cramp and spasm: Secondary | ICD-10-CM | POA: Diagnosis not present

## 2015-07-24 DIAGNOSIS — K219 Gastro-esophageal reflux disease without esophagitis: Secondary | ICD-10-CM | POA: Diagnosis not present

## 2015-07-24 DIAGNOSIS — N401 Enlarged prostate with lower urinary tract symptoms: Secondary | ICD-10-CM | POA: Diagnosis not present

## 2015-07-24 DIAGNOSIS — M1990 Unspecified osteoarthritis, unspecified site: Secondary | ICD-10-CM | POA: Diagnosis not present

## 2015-07-24 DIAGNOSIS — Z1389 Encounter for screening for other disorder: Secondary | ICD-10-CM | POA: Diagnosis not present

## 2015-07-24 DIAGNOSIS — G4700 Insomnia, unspecified: Secondary | ICD-10-CM | POA: Diagnosis not present

## 2015-07-24 DIAGNOSIS — I10 Essential (primary) hypertension: Secondary | ICD-10-CM | POA: Diagnosis not present

## 2015-07-24 DIAGNOSIS — Z6826 Body mass index (BMI) 26.0-26.9, adult: Secondary | ICD-10-CM | POA: Diagnosis not present

## 2015-07-24 DIAGNOSIS — M5136 Other intervertebral disc degeneration, lumbar region: Secondary | ICD-10-CM | POA: Diagnosis not present

## 2015-08-01 DIAGNOSIS — D225 Melanocytic nevi of trunk: Secondary | ICD-10-CM | POA: Diagnosis not present

## 2015-08-01 DIAGNOSIS — L821 Other seborrheic keratosis: Secondary | ICD-10-CM | POA: Diagnosis not present

## 2015-08-01 DIAGNOSIS — L570 Actinic keratosis: Secondary | ICD-10-CM | POA: Diagnosis not present

## 2015-08-22 DIAGNOSIS — R197 Diarrhea, unspecified: Secondary | ICD-10-CM | POA: Diagnosis not present

## 2015-08-22 DIAGNOSIS — Z6826 Body mass index (BMI) 26.0-26.9, adult: Secondary | ICD-10-CM | POA: Diagnosis not present

## 2015-08-28 DIAGNOSIS — M47817 Spondylosis without myelopathy or radiculopathy, lumbosacral region: Secondary | ICD-10-CM | POA: Diagnosis not present

## 2015-10-02 DIAGNOSIS — M2012 Hallux valgus (acquired), left foot: Secondary | ICD-10-CM | POA: Diagnosis not present

## 2015-11-01 DIAGNOSIS — M19072 Primary osteoarthritis, left ankle and foot: Secondary | ICD-10-CM | POA: Diagnosis not present

## 2015-11-16 DIAGNOSIS — Z86010 Personal history of colonic polyps: Secondary | ICD-10-CM | POA: Diagnosis not present

## 2015-11-16 DIAGNOSIS — K921 Melena: Secondary | ICD-10-CM | POA: Diagnosis not present

## 2015-11-20 ENCOUNTER — Other Ambulatory Visit: Payer: Self-pay | Admitting: Gastroenterology

## 2015-11-20 DIAGNOSIS — Z86010 Personal history of colonic polyps: Secondary | ICD-10-CM | POA: Diagnosis not present

## 2015-11-20 DIAGNOSIS — K5730 Diverticulosis of large intestine without perforation or abscess without bleeding: Secondary | ICD-10-CM | POA: Diagnosis not present

## 2015-11-20 DIAGNOSIS — D122 Benign neoplasm of ascending colon: Secondary | ICD-10-CM | POA: Diagnosis not present

## 2015-11-20 DIAGNOSIS — D123 Benign neoplasm of transverse colon: Secondary | ICD-10-CM | POA: Diagnosis not present

## 2015-11-20 DIAGNOSIS — D126 Benign neoplasm of colon, unspecified: Secondary | ICD-10-CM | POA: Diagnosis not present

## 2015-12-14 DIAGNOSIS — M859 Disorder of bone density and structure, unspecified: Secondary | ICD-10-CM | POA: Diagnosis not present

## 2015-12-14 DIAGNOSIS — Z125 Encounter for screening for malignant neoplasm of prostate: Secondary | ICD-10-CM | POA: Diagnosis not present

## 2015-12-14 DIAGNOSIS — I10 Essential (primary) hypertension: Secondary | ICD-10-CM | POA: Diagnosis not present

## 2015-12-14 DIAGNOSIS — E7800 Pure hypercholesterolemia, unspecified: Secondary | ICD-10-CM | POA: Diagnosis not present

## 2015-12-26 DIAGNOSIS — L400 Psoriasis vulgaris: Secondary | ICD-10-CM | POA: Diagnosis not present

## 2016-01-01 DIAGNOSIS — Z1212 Encounter for screening for malignant neoplasm of rectum: Secondary | ICD-10-CM | POA: Diagnosis not present

## 2016-01-17 DIAGNOSIS — E7800 Pure hypercholesterolemia, unspecified: Secondary | ICD-10-CM | POA: Diagnosis not present

## 2016-01-17 DIAGNOSIS — N401 Enlarged prostate with lower urinary tract symptoms: Secondary | ICD-10-CM | POA: Diagnosis not present

## 2016-01-17 DIAGNOSIS — Z0000 Encounter for general adult medical examination without abnormal findings: Secondary | ICD-10-CM | POA: Diagnosis not present

## 2016-01-17 DIAGNOSIS — G9050 Complex regional pain syndrome I, unspecified: Secondary | ICD-10-CM | POA: Diagnosis not present

## 2016-01-17 DIAGNOSIS — I10 Essential (primary) hypertension: Secondary | ICD-10-CM | POA: Diagnosis not present

## 2016-01-17 DIAGNOSIS — Z6825 Body mass index (BMI) 25.0-25.9, adult: Secondary | ICD-10-CM | POA: Diagnosis not present

## 2016-01-17 DIAGNOSIS — D126 Benign neoplasm of colon, unspecified: Secondary | ICD-10-CM | POA: Diagnosis not present

## 2016-01-17 DIAGNOSIS — M5136 Other intervertebral disc degeneration, lumbar region: Secondary | ICD-10-CM | POA: Diagnosis not present

## 2016-01-17 DIAGNOSIS — Z1389 Encounter for screening for other disorder: Secondary | ICD-10-CM | POA: Diagnosis not present

## 2016-01-17 DIAGNOSIS — G4700 Insomnia, unspecified: Secondary | ICD-10-CM | POA: Diagnosis not present

## 2016-01-17 DIAGNOSIS — Z23 Encounter for immunization: Secondary | ICD-10-CM | POA: Diagnosis not present

## 2016-01-17 DIAGNOSIS — M859 Disorder of bone density and structure, unspecified: Secondary | ICD-10-CM | POA: Diagnosis not present

## 2016-02-07 DIAGNOSIS — L400 Psoriasis vulgaris: Secondary | ICD-10-CM | POA: Diagnosis not present

## 2016-04-03 DIAGNOSIS — M19072 Primary osteoarthritis, left ankle and foot: Secondary | ICD-10-CM | POA: Diagnosis not present

## 2016-04-03 DIAGNOSIS — G5732 Lesion of lateral popliteal nerve, left lower limb: Secondary | ICD-10-CM | POA: Diagnosis not present

## 2016-04-16 DIAGNOSIS — H52203 Unspecified astigmatism, bilateral: Secondary | ICD-10-CM | POA: Diagnosis not present

## 2016-04-16 DIAGNOSIS — H2513 Age-related nuclear cataract, bilateral: Secondary | ICD-10-CM | POA: Diagnosis not present

## 2016-04-16 DIAGNOSIS — H524 Presbyopia: Secondary | ICD-10-CM | POA: Diagnosis not present

## 2016-05-09 DIAGNOSIS — M79671 Pain in right foot: Secondary | ICD-10-CM | POA: Diagnosis not present

## 2016-06-04 ENCOUNTER — Ambulatory Visit (INDEPENDENT_AMBULATORY_CARE_PROVIDER_SITE_OTHER): Payer: Medicare Other | Admitting: Neurology

## 2016-06-04 ENCOUNTER — Encounter: Payer: Self-pay | Admitting: Neurology

## 2016-06-04 DIAGNOSIS — M79672 Pain in left foot: Secondary | ICD-10-CM | POA: Diagnosis not present

## 2016-06-04 DIAGNOSIS — L570 Actinic keratosis: Secondary | ICD-10-CM | POA: Diagnosis not present

## 2016-06-04 DIAGNOSIS — G5732 Lesion of lateral popliteal nerve, left lower limb: Secondary | ICD-10-CM

## 2016-06-04 DIAGNOSIS — Z9889 Other specified postprocedural states: Secondary | ICD-10-CM

## 2016-06-04 DIAGNOSIS — R208 Other disturbances of skin sensation: Secondary | ICD-10-CM | POA: Diagnosis not present

## 2016-06-04 DIAGNOSIS — L853 Xerosis cutis: Secondary | ICD-10-CM | POA: Diagnosis not present

## 2016-06-04 DIAGNOSIS — L821 Other seborrheic keratosis: Secondary | ICD-10-CM | POA: Diagnosis not present

## 2016-06-04 DIAGNOSIS — D225 Melanocytic nevi of trunk: Secondary | ICD-10-CM | POA: Diagnosis not present

## 2016-06-04 HISTORY — DX: Other specified postprocedural states: Z98.890

## 2016-06-04 HISTORY — DX: Pain in left foot: M79.672

## 2016-06-04 HISTORY — DX: Lesion of lateral popliteal nerve, left lower limb: G57.32

## 2016-06-04 HISTORY — DX: Other disturbances of skin sensation: R20.8

## 2016-06-04 MED ORDER — LAMOTRIGINE 50 MG PO TBDP: ORAL_TABLET | ORAL | 5 refills | Status: DC

## 2016-06-04 MED ORDER — LIDOCAINE 5 % EX OINT: 1.0000 "application " | TOPICAL_OINTMENT | CUTANEOUS | 5 refills | Status: DC | PRN

## 2016-06-04 NOTE — Progress Notes (Signed)
[facility]  PATIENT: Derrick White DOB: 05/21/1940  REFERRING DOCTOR OR PCP:  Domenick Gong SOURCE: patient, records in EPIC from [place] [facility], MRI results, EMG/NCV results  _________________________________   HISTORICAL  CHIEF COMPLAINT:  Chief Complaint  Patient presents with  . Leg Pain    Derrick White is here for eval of left ankle/foot pain.  Onset 5 yrs. ago.  Sts. he had orif left ankle, has had mult. surgeries for nerve entrapment since then, without relief.  Would like to see if [facility] has other tx. options/fim    HISTORY OF PRESENT ILLNESS:  I had the pleasure seeing you patient, Derrick White, at [facility] for neurologic consultation regarding his Left ankle and foot pain.  He broke his left ankle 5 years ago, requiring plate and screws.   He later had the hardware removed without benefit.    A year leater, he had surgery to decompress the nerve.    He was referred to [place] and had the left peroneal neurectomy and removal of  L3L4 Morton's Neuroma and L2L3 intermetatarsal nerve decompression procedures.      Currently, he has a lot of pain in the left foot.    The worst pain is on the dorsum between the first and second toes has less pain between the second and third toes in the third and fourth toes. There is no pain between the fourth and fifth toes. He does not have much allodynia on the plantar surface of the foot. Pressure between the first and second, second and third and third and fourth increased pain.   Pain is better after he takes a hot shower and does worse in cold weather.   Even in optimal weather, he has a fair amount of discomfort that will increase as the day goes on and is worse when he is standing on his feet. Pain is much better when he has his feet up, in general, if he is not weightbearing.  He was tried on Lyrica but felt very off balanced and mentally slowed so he stopped.  He had mild  benefit from lidocaine containing ointments. A coupledifferent types of ointments were tried and the composition of all of them is unknown.     A nerve conduction study performed 05/09/2016 at [facility] showed an absent left superficial peroneal sensory response (was present on the right). Sural sensory response was normal. Left peroneal motor response was normal.  I personally reviewed the MRI of the cervical spine dated 01/10/2009. 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 There is multilevel foraminal narrowing, worse to the right at C5-C6 and C6-C7 where there is possible nerve root compression.   Subsequently, a few months later,  he had cervical fusion.  REVIEW OF SYSTEMS: Constitutional: No fevers, chills, sweats, or change in appetite Eyes: No visual changes, double vision, eye pain Ear, nose and throat: No hearing loss, ear pain, nasal congestion, sore throat Cardiovascular: No chest pain, palpitations Respiratory: No shortness of breath at rest or with exertion.   No wheezes GastrointestinaI: No nausea, vomiting, diarrhea, abdominal pain, fecal incontinence.   Has GERD Genitourinary: No dysuria, urinary retention or frequency.  No nocturia. Musculoskeletal: No current neck pain, back pain Integumentary: No rash, pruritus, skin lesions Neurological: as above Psychiatric: No depression at this time.  No anxiety Endocrine: No palpitations, diaphoresis, change in  appetite, change in weigh or increased thirst Hematologic/Lymphatic: No anemia, purpura, petechiae. Allergic/Immunologic: No itchy/runny eyes, nasal congestion, recent allergic reactions, rashes  ALLERGIES: Allergies  Allergen Reactions  . Codeine Hives and Itching  . Morphine Hives and Itching  . Other Other (See Comments)    Shrimp - rash Darvocet - itching    HOME MEDICATIONS:  Current  Outpatient Prescriptions:  .  calcium carbonate 1250 MG capsule, Take 1,250 mg by mouth 2 (two) times daily with a meal., Disp: , Rfl:  .  Cholecalciferol (VITAMIN D-1000 MAX ST) 1000 units tablet, Take by mouth., Disp: , Rfl:  .  cyanocobalamin 1000 MCG tablet, Take 1,000 mcg by mouth daily., Disp: , Rfl:  .  Multiple Vitamins-Minerals (CENTRUM SILVER PO), Take by mouth., Disp: , Rfl:  .  pantoprazole (PROTONIX) 40 MG tablet, TK 1 T PO QD, Disp: , Rfl: 2 .  pravastatin (PRAVACHOL) 40 MG tablet, TK 1 T PO QD, Disp: , Rfl: 2 .  valsartan (DIOVAN) 320 MG tablet, TK 1 T PO QD, Disp: , Rfl: 2  PAST MEDICAL HISTORY: No past medical history on file.  PAST SURGICAL HISTORY: See above  FAMILY HISTORY: No family history on file.  SOCIAL HISTORY:  Social History   Social History  . Marital status: Married    Spouse name: N/A  . Number of children: N/A  . Years of education: N/A   Occupational History  . Not on file.   Social History Main Topics  . Smoking status: Former Research scientist (life sciences)  . Smokeless tobacco: Not on file  . Alcohol use Not on file  . Drug use: Unknown  . Sexual activity: Not on file   Other Topics Concern  . Not on file   Social History Narrative  . No narrative on file     PHYSICAL EXAM  Vitals:   06/04/16 1426  BP: 132/74  Pulse: 74  Resp: 18  Weight: 173 lb (78.5 kg)  Height: '5\' 10"'$  (1.778 m)    Body mass index is 24.82 kg/m.   General: The patient is well-developed and well-nourished and in no acute distress  Eyes:  Funduscopic exam shows normal optic discs and retinal vessels.  Neck: The neck is post-op fusion, no carotid bruits are noted.  The neck is non-tender with mildly reduced ROm  Cardiovascular: The heart has a regular rate and rhythm with a normal S1 and S2. There were no murmurs, gallops or rubs.   Skin: Extremities are without rash or edema.   Surgical scars well healed in foot  Neurologic Exam  Mental status: The patient is alert  and oriented x 3 at the time of the examination. The patient has apparent normal recent and remote memory, with an apparently normal attention span and concentration ability.   Speech is normal.  Cranial nerves: Extraocular movements are full. There is good facial sensation to soft touch bilaterally.Facial strength is normal.  Trapezius and sternocleidomastoid strength is normal. No dysarthria is noted.  The tongue is midline, and the patient has symmetric elevation of the soft palate. No obvious hearing deficits are noted.  Motor:  Muscle bulk is normal.   Tone is normal. Strength is  5 / 5 in all 4 extremities.   Sensory: Sensory testing is reduced in superficial peroneal distribution of left foot and there is allodynia there and milder allodynia between Digits 2/3 and 3/4.  Intact to pinprick, soft touch and vibration sensation in right foot  Coordination: Cerebellar testing reveals good  finger-nose-finger and heel-to-shin bilaterally.  Gait and station: Station is normal.   Gait is mildly wide based favoring left tender foot.  Romberg is negative.   Reflexes: Deep tendon reflexes are symmetric and normal in arms, 3 at knees and 1 at ankles.   Plantar responses are flexor.    DIAGNOSTIC DATA (LABS, IMAGING, TESTING) - I reviewed patient records, labs, notes, testing and imaging myself where available.  Lab Results  Component Value Date   WBC 7.2 04/24/2009   HGB 16.3 04/24/2009   HCT 46.2 04/24/2009   MCV 96.7 04/24/2009   PLT 308 04/24/2009      Component Value Date/Time   NA 141 04/24/2009 1036   K 4.3 04/24/2009 1036   CL 104 04/24/2009 1036   CO2 28 04/24/2009 1036   GLUCOSE 101 (H) 04/24/2009 1036   BUN 16 04/24/2009 1036   CREATININE 0.78 04/24/2009 1036   CALCIUM 9.9 04/24/2009 1036   PROT 7.0 04/24/2009 1036   ALBUMIN 4.4 04/24/2009 1036   AST 21 04/24/2009 1036   ALT 23 04/24/2009 1036   ALKPHOS 30 (L) 04/24/2009 1036   BILITOT 0.7 04/24/2009 1036   GFRNONAA >60  04/24/2009 1036   GFRAA  04/24/2009 1036    >60        The eGFR has been calculated using the MDRD equation. This calculation has not been validated in all clinical situations. eGFR's persistently <60 mL/min signify possible Chronic Kidney Disease.       ASSESSMENT AND PLAN  Foot pain, left  Dysesthesia  Superficial peroneal nerve neuropathy, left  History of cervical spinal surgery   In summary, Mr. Statz is a 76 year old man who has left foot pain for the past 5 or 6 years who has undergone multiple surgical procedures. Unfortunately, pain has persisted or worsened. Recent NCV study at [facility] showed a left superficial peroneal neuropathy but was otherwise normal.  Due to persistent pain, I think we need to try to optimize him medically. I will prescribe lidocaine ointment for him to apply to his foot twice a day. Additionally I am starting lamotrigine and will titrate up to 50 mg by mouth twice a day and titrate up further if well tolerated if he does not get a benefit from lamotrigine, consider oxcarbazepine. He was unable to tolerate Lyrica in the past so would be unlikely to tolerate gabapentin.  He will return to see me in 3 months or sooner if there are new or worsening neurologic symptoms.     Richard A. Felecia Shelling, MD, PhD 7/32/2025, 4:27 PM Certified in Neurology, Clinical Neurophysiology, Sleep Medicine, Pain Medicine and Neuroimaging  [facility] [address] [place], [place] [address] [phone]

## 2016-06-04 NOTE — Patient Instructions (Signed)
The pharmacy has the prescription for lamotrigine 50 mg tablets. For 5 days, just take one half pill a day. For the next 5 days, take one half pill twice a day. For the next 5 days, take one half pill 3 times a day Then start taking one pill twice a day from this point on.    In the future, we may increase the dose further.  If you get a rash, need to stop the medication and not take it again. 

## 2016-06-10 ENCOUNTER — Telehealth: Payer: Self-pay | Admitting: *Deleted

## 2016-06-10 MED ORDER — LAMOTRIGINE 25 MG PO TABS: 50.0000 mg | ORAL_TABLET | Freq: Two times a day (BID) | ORAL | 5 refills | Status: DC

## 2016-06-10 NOTE — Telephone Encounter (Signed)
I have spoken with wife Derrick White this morning.  Lamotrigine 50mg  only comes in dissolvable tabs, and the cost per month, with ins. is $200.  I have confirmed this with the pharmacist at [facility]. Pt. can take either Lamotrigine 25mg , 2 tabs bid, or Lamotrigine 100mg , 1/2 tab bid, and the cost will decrease to $20 per month.  Per Derrick White, they would prefer Lamotrigine 25mg , 2 tabs po bid.  Rx. escribed to [facility] per her request/fim

## 2016-07-25 DIAGNOSIS — K219 Gastro-esophageal reflux disease without esophagitis: Secondary | ICD-10-CM | POA: Diagnosis not present

## 2016-07-25 DIAGNOSIS — E7800 Pure hypercholesterolemia, unspecified: Secondary | ICD-10-CM | POA: Diagnosis not present

## 2016-07-25 DIAGNOSIS — Z6826 Body mass index (BMI) 26.0-26.9, adult: Secondary | ICD-10-CM | POA: Diagnosis not present

## 2016-07-25 DIAGNOSIS — I10 Essential (primary) hypertension: Secondary | ICD-10-CM | POA: Diagnosis not present

## 2016-07-25 DIAGNOSIS — G9050 Complex regional pain syndrome I, unspecified: Secondary | ICD-10-CM | POA: Diagnosis not present

## 2016-07-25 DIAGNOSIS — N401 Enlarged prostate with lower urinary tract symptoms: Secondary | ICD-10-CM | POA: Diagnosis not present

## 2016-07-25 DIAGNOSIS — M1990 Unspecified osteoarthritis, unspecified site: Secondary | ICD-10-CM | POA: Diagnosis not present

## 2016-07-25 DIAGNOSIS — G4700 Insomnia, unspecified: Secondary | ICD-10-CM | POA: Diagnosis not present

## 2016-07-25 DIAGNOSIS — M859 Disorder of bone density and structure, unspecified: Secondary | ICD-10-CM | POA: Diagnosis not present

## 2016-07-25 DIAGNOSIS — N182 Chronic kidney disease, stage 2 (mild): Secondary | ICD-10-CM | POA: Diagnosis not present

## 2016-07-25 DIAGNOSIS — I129 Hypertensive chronic kidney disease with stage 1 through stage 4 chronic kidney disease, or unspecified chronic kidney disease: Secondary | ICD-10-CM | POA: Diagnosis not present

## 2016-07-25 DIAGNOSIS — G5732 Lesion of lateral popliteal nerve, left lower limb: Secondary | ICD-10-CM | POA: Diagnosis not present

## 2016-08-08 ENCOUNTER — Ambulatory Visit (INDEPENDENT_AMBULATORY_CARE_PROVIDER_SITE_OTHER): Payer: Medicare Other | Admitting: Neurology

## 2016-08-08 ENCOUNTER — Encounter: Payer: Self-pay | Admitting: Neurology

## 2016-08-08 VITALS — BP 121/74 | HR 64 | Resp 16 | Ht 70.0 in | Wt 181.5 lb

## 2016-08-08 DIAGNOSIS — G5732 Lesion of lateral popliteal nerve, left lower limb: Secondary | ICD-10-CM | POA: Diagnosis not present

## 2016-08-08 DIAGNOSIS — Z9889 Other specified postprocedural states: Secondary | ICD-10-CM

## 2016-08-08 DIAGNOSIS — R208 Other disturbances of skin sensation: Secondary | ICD-10-CM

## 2016-08-08 DIAGNOSIS — M79672 Pain in left foot: Secondary | ICD-10-CM | POA: Diagnosis not present

## 2016-08-08 MED ORDER — LAMOTRIGINE 100 MG PO TABS: 100.0000 mg | ORAL_TABLET | Freq: Two times a day (BID) | ORAL | Status: DC

## 2016-08-08 NOTE — Progress Notes (Signed)
[facility]  PATIENT: Derrick White DOB: 1940/07/02  REFERRING DOCTOR OR PCP:  Domenick Gong SOURCE: patient, records in EPIC from [place] and Neurology, MRI results, EMG/NCV results  _________________________________   HISTORICAL  CHIEF COMPLAINT:  Chief Complaint  Patient presents with  . Left foot/ankle pain    Sts. left ankle/foot pain is 60% improved since starting Lamictal.  He didn't feel Lidocaine ointment helped as much--"just a bandaid", and it was expensive, so he stopped it/fim    HISTORY OF PRESENT ILLNESS:  Derrick White is a 76 yo man with left ankle and foot pain.     He is much better on lamotrigine and is on 50 mg po bid.   He feels he is 60% better.  He still has some pain on the dorsum between the first and second toes but this is much better. He now does not note allodynia with light touch. He has just slight pain with pressure between the first and second, second and third toes.   Pain had been worse old and slightly better with heat but he notes less of the fluctuations now.. Pain is better when he has his feet up.  He feels the current intensity of pain is tolerable.  History:   He broke his left ankle in 2012 or 2013 requiring plate and screws.   He later had the hardware removed without benefit.    A year leater, he had surgery to decompress the peroneal nerve.    He was referred to [facility] and had the left peroneal neurectomy and removal of  L3L4 Morton's Neuroma and L2L3 intermetatarsal nerve decompression procedures.    He was tried on Lyrica but felt very off balanced and mentally slowed so he stopped.  He had mild benefit from lidocaine containing ointments. A couple different types of ointments were tried and the composition of all of them is unknown.     A nerve conduction study performed 05/09/2016 at [facility] showed an absent left superficial peroneal sensory response (was present on the right). Sural sensory response  was normal. Left peroneal motor response was normal.  I have reviewed the MRI of the cervical spine dated 01/10/2009. 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 There is multilevel foraminal narrowing, worse to the right at C5-C6 and C6-C7 where there is possible nerve root compression.   Subsequently, a few months later,  he had cervical fusion.    REVIEW OF SYSTEMS: Constitutional: No fevers, chills, sweats, or change in appetite Eyes: No visual changes, double vision, eye pain Ear, nose and throat: No hearing loss, ear pain, nasal congestion, sore throat Cardiovascular: No chest pain, palpitations Respiratory: No shortness of breath at rest or with exertion.   No wheezes GastrointestinaI: No nausea, vomiting, diarrhea, abdominal pain, fecal incontinence.   Has GERD Genitourinary: No dysuria, urinary retention or frequency.  No nocturia. Musculoskeletal: No current neck pain, back pain Integumentary: No rash, pruritus, skin lesions Neurological: as above Psychiatric: No depression at this time.  No anxiety Endocrine: No palpitations, diaphoresis, change in appetite, change in weigh or increased thirst Hematologic/Lymphatic: No anemia, purpura, petechiae. Allergic/Immunologic: No itchy/runny eyes, nasal congestion, recent allergic reactions, rashes  ALLERGIES: Allergies  Allergen Reactions  . Codeine Hives and Itching  . Morphine Hives and Itching  . Other Other (See Comments)    Shrimp - rash Darvocet -  itching    HOME MEDICATIONS:  Current Outpatient Prescriptions:  .  calcium carbonate 1250 MG capsule, Take 1,250 mg by mouth 2 (two) times daily with a meal., Disp: , Rfl:  .  Cholecalciferol (VITAMIN D-1000 MAX ST) 1000 units tablet, Take by mouth., Disp: , Rfl:  .  cyanocobalamin 1000 MCG tablet, Take 1,000 mcg by mouth daily., Disp: , Rfl:  .   lamoTRIgine (LAMICTAL) 100 MG tablet, Take 1 tablet (100 mg total) by mouth 2 (two) times daily., Disp: 180 tablet, Rfl: mg .  lidocaine (XYLOCAINE) 5 % ointment, Apply 1 application topically as needed. Apply to left foot twice a day, Disp: 50 g, Rfl: 5 .  Multiple Vitamins-Minerals (CENTRUM SILVER PO), Take by mouth., Disp: , Rfl:  .  pantoprazole (PROTONIX) 40 MG tablet, TK 1 T PO QD, Disp: , Rfl: 2 .  pravastatin (PRAVACHOL) 40 MG tablet, TK 1 T PO QD, Disp: , Rfl: 2 .  valsartan (DIOVAN) 320 MG tablet, TK 1 T PO QD, Disp: , Rfl: 2  PAST MEDICAL HISTORY: No past medical history on file.  PAST SURGICAL HISTORY: See above  FAMILY HISTORY: No family history on file.  SOCIAL HISTORY:  Social History   Social History  . Marital status: Married    Spouse name: N/A  . Number of children: N/A  . Years of education: N/A   Occupational History  . Not on file.   Social History Main Topics  . Smoking status: Former Research scientist (life sciences)  . Smokeless tobacco: Never Used  . Alcohol use Not on file  . Drug use: Unknown  . Sexual activity: Not on file   Other Topics Concern  . Not on file   Social History Narrative  . No narrative on file     PHYSICAL EXAM  Vitals:   08/08/16 0909  BP: 121/74  Pulse: 64  Resp: 16  Weight: 181 lb 8 oz (82.3 kg)  Height: '5\' 10"'$  (1.778 m)    Body mass index is 26.04 kg/m.   General: The patient is well-developed and well-nourished and in no acute distress   Neck: The neck is post-op fusion.    Skin/Ext: Extremities are without rash or edema.   Surgical scars well healed in foot  Neurologic Exam  Mental status: The patient is alert and oriented x 3 at the time of the examination. The patient has apparent normal recent and remote memory, with an apparently normal attention span and concentration ability.   Speech is normal.  Cranial nerves: Extraocular movements are full. There is good facial sensation to soft touch bilaterally.Facial strength is  normal.  Trapezius and sternocleidomastoid strength is normal. No dysarthria is noted.  The tongue is midline, and the patient has symmetric elevation of the soft palate. No obvious hearing deficits are noted.  Motor:  Muscle bulk is normal.   Tone is normal. Strength is  5 / 5 in all 4 extremities.   Sensory: Sensory testing is reduced in superficial peroneal distribution of left foot and there is now much less allodynia there than last visit.  Sensation intact to pinprick, soft touch and vibration sensation in right foot  Coordination: Cerebellar testing reveals good finger-nose-finger and heel-to-shin bilaterally.  Gait and station: Station is normal.   Gait is mildly wide based favoring left tender foot.  Romberg is negative.   Reflexes: Deep tendon reflexes are symmetric and normal in arms, 3 at knees and 1 at ankles.       DIAGNOSTIC  DATA (LABS, IMAGING, TESTING) - I reviewed patient records, labs, notes, testing and imaging myself where available.  Lab Results  Component Value Date   WBC 7.2 04/24/2009   HGB 16.3 04/24/2009   HCT 46.2 04/24/2009   MCV 96.7 04/24/2009   PLT 308 04/24/2009      Component Value Date/Time   NA 141 04/24/2009 1036   K 4.3 04/24/2009 1036   CL 104 04/24/2009 1036   CO2 28 04/24/2009 1036   GLUCOSE 101 (H) 04/24/2009 1036   BUN 16 04/24/2009 1036   CREATININE 0.78 04/24/2009 1036   CALCIUM 9.9 04/24/2009 1036   PROT 7.0 04/24/2009 1036   ALBUMIN 4.4 04/24/2009 1036   AST 21 04/24/2009 1036   ALT 23 04/24/2009 1036   ALKPHOS 30 (L) 04/24/2009 1036   BILITOT 0.7 04/24/2009 1036   GFRNONAA >60 04/24/2009 1036   GFRAA  04/24/2009 1036    >60        The eGFR has been calculated using the MDRD equation. This calculation has not been validated in all clinical situations. eGFR's persistently <60 mL/min signify possible Chronic Kidney Disease.       ASSESSMENT AND PLAN  Superficial peroneal nerve neuropathy,  left  Dysesthesia  Foot pain, left  History of cervical spinal surgery   1.   He is doing much better on lamotrigine at 50 mg by mouth twice a day. I'll increase his dose to 100 mg by mouth twice a day. Feels she does not do any better than the lower dose, he can go back down. 2.   Continue to be active and exercises as tolerated. 3.    He will return to see me as needed. He should call if he has worsening pain or other new symptoms.   Keierra Nudo A. Felecia Shelling, MD, PhD 3/84/6659, 9:35 PM Certified in Neurology, Clinical Neurophysiology, Sleep Medicine, Pain Medicine and Neuroimaging  [facility] [address], [place], [place] [address] [phone]

## 2016-09-03 DIAGNOSIS — I10 Essential (primary) hypertension: Secondary | ICD-10-CM | POA: Diagnosis not present

## 2016-11-05 DIAGNOSIS — H04122 Dry eye syndrome of left lacrimal gland: Secondary | ICD-10-CM | POA: Diagnosis not present

## 2016-12-02 DIAGNOSIS — N182 Chronic kidney disease, stage 2 (mild): Secondary | ICD-10-CM | POA: Diagnosis not present

## 2016-12-02 DIAGNOSIS — M5136 Other intervertebral disc degeneration, lumbar region: Secondary | ICD-10-CM | POA: Diagnosis not present

## 2016-12-02 DIAGNOSIS — Z6826 Body mass index (BMI) 26.0-26.9, adult: Secondary | ICD-10-CM | POA: Diagnosis not present

## 2016-12-03 DIAGNOSIS — D692 Other nonthrombocytopenic purpura: Secondary | ICD-10-CM | POA: Diagnosis not present

## 2016-12-03 DIAGNOSIS — L570 Actinic keratosis: Secondary | ICD-10-CM | POA: Diagnosis not present

## 2016-12-11 ENCOUNTER — Telehealth: Payer: Self-pay | Admitting: Neurology

## 2016-12-11 MED ORDER — LAMOTRIGINE 100 MG PO TABS: ORAL_TABLET | ORAL | Status: DC

## 2016-12-11 NOTE — Telephone Encounter (Signed)
Needs appt.  Rx. escribed with request to call for appt. prior to future refills/fim

## 2016-12-11 NOTE — Telephone Encounter (Signed)
Pt wife calling in for the refill of lamoTRIgine (LAMICTAL) 100 MG tablet, she is asking that this be called into the [facility] pharmacy(pt gets a better deal by using this pharmacy) [facility] # [phone]

## 2016-12-11 NOTE — Addendum Note (Signed)
Addended by: France Ravens I on: 12/11/2016 03:13 PM   Modules accepted: Orders

## 2017-01-10 DIAGNOSIS — Z125 Encounter for screening for malignant neoplasm of prostate: Secondary | ICD-10-CM | POA: Diagnosis not present

## 2017-01-10 DIAGNOSIS — E7800 Pure hypercholesterolemia, unspecified: Secondary | ICD-10-CM | POA: Diagnosis not present

## 2017-01-10 DIAGNOSIS — M859 Disorder of bone density and structure, unspecified: Secondary | ICD-10-CM | POA: Diagnosis not present

## 2017-01-10 DIAGNOSIS — I10 Essential (primary) hypertension: Secondary | ICD-10-CM | POA: Diagnosis not present

## 2017-01-17 DIAGNOSIS — I129 Hypertensive chronic kidney disease with stage 1 through stage 4 chronic kidney disease, or unspecified chronic kidney disease: Secondary | ICD-10-CM | POA: Diagnosis not present

## 2017-01-17 DIAGNOSIS — R808 Other proteinuria: Secondary | ICD-10-CM | POA: Diagnosis not present

## 2017-01-17 DIAGNOSIS — Z6825 Body mass index (BMI) 25.0-25.9, adult: Secondary | ICD-10-CM | POA: Diagnosis not present

## 2017-01-17 DIAGNOSIS — G4709 Other insomnia: Secondary | ICD-10-CM | POA: Diagnosis not present

## 2017-01-17 DIAGNOSIS — Z1389 Encounter for screening for other disorder: Secondary | ICD-10-CM | POA: Diagnosis not present

## 2017-01-17 DIAGNOSIS — Z23 Encounter for immunization: Secondary | ICD-10-CM | POA: Diagnosis not present

## 2017-01-17 DIAGNOSIS — Z0000 Encounter for general adult medical examination without abnormal findings: Secondary | ICD-10-CM | POA: Diagnosis not present

## 2017-01-17 DIAGNOSIS — M859 Disorder of bone density and structure, unspecified: Secondary | ICD-10-CM | POA: Diagnosis not present

## 2017-01-17 DIAGNOSIS — M1990 Unspecified osteoarthritis, unspecified site: Secondary | ICD-10-CM | POA: Diagnosis not present

## 2017-01-17 DIAGNOSIS — N401 Enlarged prostate with lower urinary tract symptoms: Secondary | ICD-10-CM | POA: Diagnosis not present

## 2017-01-17 DIAGNOSIS — E7800 Pure hypercholesterolemia, unspecified: Secondary | ICD-10-CM | POA: Diagnosis not present

## 2017-01-17 DIAGNOSIS — K219 Gastro-esophageal reflux disease without esophagitis: Secondary | ICD-10-CM | POA: Diagnosis not present

## 2017-01-17 DIAGNOSIS — N182 Chronic kidney disease, stage 2 (mild): Secondary | ICD-10-CM | POA: Diagnosis not present

## 2017-01-29 ENCOUNTER — Ambulatory Visit: Payer: Medicare Other | Admitting: Neurology

## 2017-02-10 ENCOUNTER — Ambulatory Visit: Payer: Self-pay | Admitting: Surgery

## 2017-02-10 DIAGNOSIS — R221 Localized swelling, mass and lump, neck: Secondary | ICD-10-CM | POA: Diagnosis not present

## 2017-02-10 NOTE — H&P (Signed)
Derrick White 02/10/2017 12:09 PM Location: [facility] Patient #: [id number] DOB: [birth year] Married / Language: [name] / Race: White Male  History of Present Illness [name] MD; 02/10/2017 12:38 PM) The patient is a 76 year old male who presents with a soft tissue mass. Note for "Soft tissue mass": ` ` Patient sent for surgical consultation at the request of Dr. Osborne Casco, [facility]  Chief Complaint: Enlarging mass on neck  The patient is a pleasant active retired gentleman. He is noticed a lump on the back of his neck for many years. It is calm larger more recently has started to become uncomfortable and noticeable. It concerns him. I discussed with his primary care physician. Surgical consultation offered. He denies any history of fall or trauma. He is had left shoulder surgery and left anterior neck surgery. Nothing on the back of his neck. He cannot recall any insect bite. He is never noticed inflammation or drainage. No history of skin infections. History of neurofibromatosis.  Had saw him 3 years ago when he was having some. He isn't symptomatic external hemorrhoid. I offered surgery. He changed his bowel regimen and hygiene, and became less irritated. He held off on any surgery.  (Review of systems as stated in this history (HPI) or in the review of systems. Otherwise all other 12 point ROS are negative)    Physical Exam [name] MD; 02/10/2017 12:31 PM)  General Mental Status-Alert. General Appearance-Not in acute distress, Not Sickly. Orientation-Oriented X3. Hydration-Well hydrated. Voice-Normal.  Integumentary Global Assessment Upon inspection and palpation of skin surfaces of the - Axillae: non-tender, no inflammation or ulceration, no drainage. and Distribution of scalp and body hair is normal. General Characteristics Temperature - normal warmth is noted.  Head and Neck Head-normocephalic,  atraumatic with no lesions or palpable masses. Face Global Assessment - atraumatic, no absence of expression. Neck Global Assessment - no abnormal movements, no bruit auscultated on the right, no bruit auscultated on the left, no decreased range of motion, non-tender. Trachea-midline. Thyroid Gland Characteristics - non-tender.  Eye Eyeball - Left-Extraocular movements intact, No Nystagmus. Eyeball - Right-Extraocular movements intact, No Nystagmus. Cornea - Left-No Hazy. Cornea - Right-No Hazy. Sclera/Conjunctiva - Left-No scleral icterus, No Discharge. Sclera/Conjunctiva - Right-No scleral icterus, No Discharge. Pupil - Left-Direct reaction to light normal. Pupil - Right-Direct reaction to light normal.  ENMT Ears Pinna - Left - no drainage observed, no generalized tenderness observed. Right - no drainage observed, no generalized tenderness observed. Nose and Sinuses External Inspection of the Nose - no destructive lesion observed. Inspection of the nares - Left - quiet respiration. Right - quiet respiration. Mouth and Throat Lips - Upper Lip - no fissures observed, no pallor noted. Lower Lip - no fissures observed, no pallor noted. Nasopharynx - no discharge present. Oral Cavity/Oropharynx - Tongue - no dryness observed. Oral Mucosa - no cyanosis observed. Hypopharynx - no evidence of airway distress observed.  Chest and Lung Exam Inspection Movements - Normal and Symmetrical. Accessory muscles - No use of accessory muscles in breathing. Palpation Palpation of the chest reveals - Non-tender. Auscultation Breath sounds - Normal and Clear.  Cardiovascular Auscultation Rhythm - Regular. Murmurs & Other Heart Sounds - Auscultation of the heart reveals - No Murmurs and No Systolic Clicks.  Abdomen Inspection Inspection of the abdomen reveals - No Visible peristalsis and No Abnormal pulsations. Umbilicus - No Bleeding, No Urine  drainage. Palpation/Percussion Palpation and Percussion of the abdomen reveal - Soft,  Non Tender, No Rebound tenderness, No Rigidity (guarding) and No Cutaneous hyperesthesia.  Male Genitourinary Sexual Maturity Tanner 5 - Adult hair pattern. Glans-Normal. Foreskin-Normal. Penis-Normal.  Rectal Anorectal Exam External - generalized tenderness, external hemorrhoids, rash noted on exam and ulcerated, no anal fissures, no anorectal fistula, no pilonidal cysts, no pilonidal sinuses, no warts . Note: Deferred today  Peripheral Vascular Upper Extremity Inspection - Left - No Cyanotic nailbeds, Not Ischemic. Right - No Cyanotic nailbeds, Not Ischemic.  Neurologic Neurologic evaluation reveals -normal attention span and ability to concentrate, able to name objects and repeat phrases. Appropriate fund of knowledge , normal sensation and normal coordination. Mental Status Affect - not angry, not paranoid. Cranial Nerves-Normal Bilaterally. Gait-Normal.  Neuropsychiatric Mental status exam performed with findings of-able to articulate well with normal speech/language, rate, volume and coherence, thought content normal with ability to perform basic computations and apply abstract reasoning and no evidence of hallucinations, delusions, obsessions or homicidal/suicidal ideation.  Musculoskeletal Global Assessment Spine, Ribs and Pelvis - no instability, subluxation or laxity. Right Upper Extremity - no instability, subluxation or laxity. Note: 4 x 3 cm ellipsoid subcutaneous mass on posterior lateral neck overlying trapezius. Not going quite into the supraclavicular fossa. Soft. Not inflamed but sensitive.  Lymphatic Head & Neck  General Head & Neck Lymphatics: Bilateral - Description - No Localized lymphadenopathy. Axillary  General Axillary Region: Bilateral - Description - No Localized lymphadenopathy. Femoral & Inguinal  Generalized Femoral & Inguinal Lymphatics:  Left - Description - No Localized lymphadenopathy. Right - Description - No Localized lymphadenopathy.    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  He's had it for many years but it is starting to get larger and become more sensitive and painful.  Because it is sensitive & uncomfortable, he like to do this with outpatient surgery with at least some sedation. He skeptical he can tolerate local anesthetic with his thick skin and this going more deeply. Because it is fixed to the deeper structures and partially climbing anterior to the trapezius, I don't think that's unreasonable to do this in the operating room under more controlled conditions. Hopefully can be just monitored deep sedation. Most likely decubitus versus prone positioning. We will work to coordinate a convenient time.  Current Plans You are being scheduled for surgery- Our schedulers will call you.  You should hear from our office's scheduling department within 5 working days about the location, date, and time of surgery. We try to make accommodations for patient's preferences in scheduling surgery, but sometimes the OR schedule or the surgeon's schedule prevents [place] from making those accommodations.  If you have not heard from our office [phone]) in 5 working days, call the office and ask for your surgeon's nurse.  If you have other questions about your diagnosis, plan, or surgery, call the office and ask for your surgeon's nurse.  The pathophysiology of skin & subcutaneous masses was discussed. Natural history risks without surgery were discussed. I recommended surgery to remove the mass. I explained the technique of removal with use of local anesthesia & possible need for more aggressive sedation/anesthesia for patient comfort.  Risks such as bleeding, infection, wound breakdown, heart attack,  death, and other risks were discussed. I noted a good likelihood this will help address the problem. Possibility that this will not correct all symptoms was explained. Possibility of regrowth/recurrence of the mass was discussed.  We will work to minimize complications. Questions were answered. The patient expresses understanding & wishes to proceed with surgery.  [name], M.D., F.A.C.S. Gastrointestinal and Minimally Invasive Surgery [facility] [address], [place], [place] [id number] [phone] Main / Paging

## 2017-02-10 NOTE — H&P (Signed)
Derrick White 02/10/2017 12:09 PM Location: [facility] Patient #: [id number] DOB: 1941/04/12 Married / Language: Derrick White / Race: White Male  History of Present Illness Derrick Hector MD; 02/10/2017 12:38 PM) The patient is a 76 year old male who presents with a soft tissue mass. Note for "Soft tissue mass": ` ` Patient sent for surgical consultation at the request of Dr. Osborne White, [facility]  Chief Complaint: Enlarging mass on neck  The patient is a pleasant active retired gentleman. He is noticed a lump on the back of his neck for many years. It is calm larger more recently has started to become uncomfortable and noticeable. It concerns him. I discussed with his primary care physician. Surgical consultation offered. He denies any history of fall or trauma. He is had left shoulder surgery and left anterior neck surgery. Nothing on the back of his neck. He cannot recall any insect bite. He is never noticed inflammation or drainage. No history of skin infections. History of neurofibromatosis.  Had saw him 3 years ago when he was having some. He isn't symptomatic external hemorrhoid. I offered surgery. He changed his bowel regimen and hygiene, and became less irritated. He held off on any surgery.  (Review of systems as stated in this history (HPI) or in the review of systems. Otherwise all other 12 point ROS are negative)    Physical Exam Derrick Hector MD; 02/10/2017 12:31 PM)  General Mental Status-Alert. General Appearance-Not in acute distress, Not Sickly. Orientation-Oriented X3. Hydration-Well hydrated. Voice-Normal.  Integumentary Global Assessment Upon inspection and palpation of skin surfaces of the - Axillae: non-tender, no inflammation or ulceration, no drainage. and Distribution of scalp and body hair is normal. General Characteristics Temperature - normal warmth is noted.  Head and Neck Head-normocephalic,  atraumatic with no lesions or palpable masses. Face Global Assessment - atraumatic, no absence of expression. Neck Global Assessment - no abnormal movements, no bruit auscultated on the right, no bruit auscultated on the left, no decreased range of motion, non-tender. Trachea-midline. Thyroid Gland Characteristics - non-tender.  Eye Eyeball - Left-Extraocular movements intact, No Nystagmus. Eyeball - Right-Extraocular movements intact, No Nystagmus. Cornea - Left-No Hazy. Cornea - Right-No Hazy. Sclera/Conjunctiva - Left-No scleral icterus, No Discharge. Sclera/Conjunctiva - Right-No scleral icterus, No Discharge. Pupil - Left-Direct reaction to light normal. Pupil - Right-Direct reaction to light normal.  ENMT Ears Pinna - Left - no drainage observed, no generalized tenderness observed. Right - no drainage observed, no generalized tenderness observed. Nose and Sinuses External Inspection of the Nose - no destructive lesion observed. Inspection of the nares - Left - quiet respiration. Right - quiet respiration. Mouth and Throat Lips - Upper Lip - no fissures observed, no pallor noted. Lower Lip - no fissures observed, no pallor noted. Nasopharynx - no discharge present. Oral Cavity/Oropharynx - Tongue - no dryness observed. Oral Mucosa - no cyanosis observed. Hypopharynx - no evidence of airway distress observed.  Chest and Lung Exam Inspection Movements - Normal and Symmetrical. Accessory muscles - No use of accessory muscles in breathing. Palpation Palpation of the chest reveals - Non-tender. Auscultation Breath sounds - Normal and Clear.  Cardiovascular Auscultation Rhythm - Regular. Murmurs & Other Heart Sounds - Auscultation of the heart reveals - No Murmurs and No Systolic Clicks.  Abdomen Inspection Inspection of the abdomen reveals - No Visible peristalsis and No Abnormal pulsations. Umbilicus - No Bleeding, No Urine  drainage. Palpation/Percussion Palpation and Percussion of the abdomen reveal - Soft,  Non Tender, No Rebound tenderness, No Rigidity (guarding) and No Cutaneous hyperesthesia.  Male Genitourinary Sexual Maturity Tanner 5 - Adult hair pattern. Glans-Normal. Foreskin-Normal. Penis-Normal.  Rectal Anorectal Exam External - generalized tenderness, external hemorrhoids, rash noted on exam and ulcerated, no anal fissures, no anorectal fistula, no pilonidal cysts, no pilonidal sinuses, no warts . Note: Deferred today  Peripheral Vascular Upper Extremity Inspection - Left - No Cyanotic nailbeds, Not Ischemic. Right - No Cyanotic nailbeds, Not Ischemic.  Neurologic Neurologic evaluation reveals -normal attention span and ability to concentrate, able to name objects and repeat phrases. Appropriate fund of knowledge , normal sensation and normal coordination. Mental Status Affect - not angry, not paranoid. Cranial Nerves-Normal Bilaterally. Gait-Normal.  Neuropsychiatric Mental status exam performed with findings of-able to articulate well with normal speech/language, rate, volume and coherence, thought content normal with ability to perform basic computations and apply abstract reasoning and no evidence of hallucinations, delusions, obsessions or homicidal/suicidal ideation.  Musculoskeletal Global Assessment Spine, Ribs and Pelvis - no instability, subluxation or laxity. Right Upper Extremity - no instability, subluxation or laxity. Note: 4 x 3 cm ellipsoid subcutaneous mass on posterior lateral neck overlying trapezius. Not going quite into the supraclavicular fossa. Soft. Not inflamed but sensitive.  Lymphatic Head & Neck  General Head & Neck Lymphatics: Bilateral - Description - No Localized lymphadenopathy. Axillary  General Axillary Region: Bilateral - Description - No Localized lymphadenopathy. Femoral & Inguinal  Generalized Femoral & Inguinal Lymphatics:  Left - Description - No Localized lymphadenopathy. Right - Description - No Localized lymphadenopathy.    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  He's had it for many years but it is starting to get larger and become more sensitive and painful.  Because it is sensitive & uncomfortable, he like to do this with outpatient surgery with at least some sedation. He skeptical he can tolerate local anesthetic with his thick skin and this going more deeply. Because it is fixed to the deeper structures and partially climbing anterior to the trapezius, I don't think that's unreasonable to do this in the operating room under more controlled conditions. Hopefully can be just monitored deep sedation. Most likely decubitus versus prone positioning. We will work to coordinate a convenient time.  Current Plans You are being scheduled for surgery- Our schedulers will call you.  You should hear from our office's scheduling department within 5 working days about the location, date, and time of surgery. We try to make accommodations for patient's preferences in scheduling surgery, but sometimes the OR schedule or the surgeon's schedule prevents [place] from making those accommodations.  If you have not heard from our office [phone]) in 5 working days, call the office and ask for your surgeon's nurse.  If you have other questions about your diagnosis, plan, or surgery, call the office and ask for your surgeon's nurse.  The pathophysiology of skin & subcutaneous masses was discussed. Natural history risks without surgery were discussed. I recommended surgery to remove the mass. I explained the technique of removal with use of local anesthesia & possible need for more aggressive sedation/anesthesia for patient comfort.  Risks such as bleeding, infection, wound breakdown, heart attack,  death, and other risks were discussed. I noted a good likelihood this will help address the problem. Possibility that this will not correct all symptoms was explained. Possibility of regrowth/recurrence of the mass was discussed.  We will work to minimize complications. Questions were answered. The patient expresses understanding & wishes to proceed with surgery.  Derrick White, M.D., F.A.C.S. Gastrointestinal and Minimally Invasive Surgery [facility] [address] [place], [place] [address] [phone] Main / Paging

## 2017-03-06 ENCOUNTER — Ambulatory Visit: Admit: 2017-03-06 | Payer: PRIVATE HEALTH INSURANCE | Admitting: Surgery

## 2017-03-06 SURGERY — EXCISION MASS
Anesthesia: Choice

## 2017-03-17 ENCOUNTER — Ambulatory Visit (INDEPENDENT_AMBULATORY_CARE_PROVIDER_SITE_OTHER): Payer: Medicare Other | Admitting: Neurology

## 2017-03-17 ENCOUNTER — Encounter (INDEPENDENT_AMBULATORY_CARE_PROVIDER_SITE_OTHER): Payer: Self-pay

## 2017-03-17 ENCOUNTER — Encounter: Payer: Self-pay | Admitting: Neurology

## 2017-03-17 VITALS — BP 126/64 | HR 62 | Resp 20 | Ht 70.0 in | Wt 182.0 lb

## 2017-03-17 DIAGNOSIS — M79672 Pain in left foot: Secondary | ICD-10-CM | POA: Diagnosis not present

## 2017-03-17 DIAGNOSIS — G5732 Lesion of lateral popliteal nerve, left lower limb: Secondary | ICD-10-CM | POA: Diagnosis not present

## 2017-03-17 DIAGNOSIS — R208 Other disturbances of skin sensation: Secondary | ICD-10-CM

## 2017-03-17 MED ORDER — LAMOTRIGINE 100 MG PO TABS: ORAL_TABLET | ORAL | Status: DC

## 2017-03-17 MED ORDER — OXCARBAZEPINE 300 MG PO TABS: 300.0000 mg | ORAL_TABLET | Freq: Two times a day (BID) | ORAL | 11 refills | Status: DC

## 2017-03-17 NOTE — Patient Instructions (Signed)
For the first week, take only one of the oxcarbazepine's. Then go up to one twice a day. Cut back the lamotrigine to one half pill twice a day.

## 2017-03-17 NOTE — Progress Notes (Signed)
[facility]  PATIENT: FEDERICK White DOB: 12-06-1940  REFERRING DOCTOR OR PCP:  Domenick Gong SOURCE: patient, records in EPIC from [place] [facility], MRI results, EMG/NCV results  _________________________________   HISTORICAL  CHIEF COMPLAINT:  Chief Complaint  Patient presents with  . Left Foot/Ankle Pain    Sts. left ankle/foot pain are some better than at last ov/fim    HISTORY OF PRESENT ILLNESS:  Derrick White is a 76 yo man with left ankle and foot pain.       Update 03/17/2017:   He noted some improvement with lamotrigine initially but a higher dose did not help any more.     The pain is in the feet, worse in the toes and milder at the ankles.    There is allodynia as well as tingling pain.   He feels mildly weak in his feet,    Lyrica was not tolerated.   He has not tried Trileptal.     From 08/08/2016:   He is much better on lamotrigine and is on 50 mg po bid.   He feels he is 60% better.  He still has some pain on the dorsum between the first and second toes but this is much better. He now does not note allodynia with light touch. He has just slight pain with pressure between the first and second, second and third toes.   Pain had been worse old and slightly better with heat but he notes less of the fluctuations now.. Pain is better when he has his feet up.  He feels the current intensity of pain is tolerable.  History:   He broke his left ankle in 2012 or 2013 requiring plate and screws.   He later had the hardware removed without benefit.    A year leater, he had surgery to decompress the peroneal nerve.    He was [facility] to [facility] and had the left peroneal neurectomy and removal of  L3L4 Morton's Neuroma and L2L3 intermetatarsal nerve decompression procedures.    He was tried on Lyrica but felt very off balanced and mentally slowed so he stopped.  He had mild benefit from lidocaine containing ointments. A couple different  types of ointments were tried and the composition of all of them is unknown.     A nerve conduction study performed 05/09/2016 at [facility] showed an absent left superficial peroneal sensory response (was present on the right). Sural sensory response was normal. Left peroneal motor response was normal.  I have reviewed the MRI of the cervical spine dated 01/10/2009. 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 There is multilevel foraminal narrowing, worse to the right at C5-C6 and C6-C7 where there is possible nerve root compression.   Subsequently, a few months later,  he had cervical fusion.    REVIEW OF SYSTEMS: Constitutional: No fevers, chills, sweats, or change in appetite Eyes: No visual changes, double vision, eye pain Ear, nose and throat: No hearing loss, ear pain, nasal congestion, sore throat Cardiovascular: No chest pain, palpitations Respiratory: No shortness of breath at rest or with exertion.   No wheezes GastrointestinaI: No nausea, vomiting, diarrhea, abdominal pain, fecal incontinence.   Has GERD Genitourinary: No dysuria, urinary retention or frequency.  No nocturia. Musculoskeletal: No current neck pain, back pain Integumentary: No rash, pruritus, skin lesions Neurological: as above Psychiatric: No  depression at this time.  No anxiety Endocrine: No palpitations, diaphoresis, change in appetite, change in weigh or increased thirst Hematologic/Lymphatic: No anemia, purpura, petechiae. Allergic/Immunologic: No itchy/runny eyes, nasal congestion, recent allergic reactions, rashes  ALLERGIES: Allergies  Allergen Reactions  . Codeine Hives and Itching  . Morphine Hives and Itching  . Other Other (See Comments)    Shrimp - rash Darvocet - itching    HOME MEDICATIONS:  Current Outpatient Prescriptions:  .  calcium carbonate 1250 MG capsule, Take  1,250 mg by mouth 2 (two) times daily with a meal., Disp: , Rfl:  .  Cholecalciferol (VITAMIN D-1000 MAX ST) 1000 units tablet, Take by mouth., Disp: , Rfl:  .  cyanocobalamin 1000 MCG tablet, Take 1,000 mcg by mouth daily., Disp: , Rfl:  .  lamoTRIgine (LAMICTAL) 100 MG tablet, Take one tablet twice daily.  Needs appointment prior to future refills.  Please call [phone] for appt., Disp: 180 tablet, Rfl: mg .  lidocaine (XYLOCAINE) 5 % ointment, Apply 1 application topically as needed. Apply to left foot twice a day, Disp: 50 g, Rfl: 5 .  Multiple Vitamins-Minerals (CENTRUM SILVER PO), Take by mouth., Disp: , Rfl:  .  pantoprazole (PROTONIX) 40 MG tablet, TK 1 T PO QD, Disp: , Rfl: 2 .  pravastatin (PRAVACHOL) 40 MG tablet, TK 1 T PO QD, Disp: , Rfl: 2 .  telmisartan (MICARDIS) 80 MG tablet, , Disp: , Rfl:   PAST MEDICAL HISTORY: No past medical history on file.  PAST SURGICAL HISTORY: See above  FAMILY HISTORY: No family history on file.  SOCIAL HISTORY:  Social History   Social History  . Marital status: Married    Spouse name: N/A  . Number of children: N/A  . Years of education: N/A   Occupational History  . Not on file.   Social History Main Topics  . Smoking status: Former Research scientist (life sciences)  . Smokeless tobacco: Never Used  . Alcohol use Not on file  . Drug use: Unknown  . Sexual activity: Not on file   Other Topics Concern  . Not on file   Social History Narrative  . No narrative on file     PHYSICAL EXAM  Vitals:   03/17/17 1305  BP: 126/64  Pulse: 62  Resp: 20  Weight: 182 lb (82.6 kg)  Height: '5\' 10"'$  (1.778 m)    Body mass index is 26.11 kg/m.   General: The patient is well-developed and well-nourished and in no acute distress    Skin/Ext: Extremities are without rash or edema.   Surgical scars well healed on left foot  Neurologic Exam  Mental status: The patient is alert and oriented x 3 at the time of the examination. The patient has apparent  normal recent and remote memory, with an apparently normal attention span and concentration ability.   Speech is normal.  Cranial nerves: Extraocular movements are full.  Facial strength is normal.  Trapezius and sternocleidomastoid strength is normal. No dysarthria is noted.  The tongue is midline, and the patient has symmetric elevation of the soft palate. No obvious hearing deficits are noted.  Motor:  Muscle bulk is normal.   Tone is normal. Strength is  5 / 5 in all 4 extremities.   Sensory:   There is reduced sensation with allodynia in the distribution of the superficial peroneal nerve of the left foot sensation is normal in the right foot. He also had mildly reduced vibration sensation at the left great toe.  Coordination: Cerebellar testing reveals good finger-nose-finger good heel-to-shin bilaterally.  Gait and station: Station is normal.   Gait is mildly wide based favoring left tender foot.  Romberg is negative.   Reflexes: Deep tendon reflexes are symmetric and normal in arms, 3 at knees and 1 at ankles.       DIAGNOSTIC DATA (LABS, IMAGING, TESTING) - I reviewed patient records, labs, notes, testing and imaging myself where available.  Lab Results  Component Value Date   WBC 7.2 04/24/2009   HGB 16.3 04/24/2009   HCT 46.2 04/24/2009   MCV 96.7 04/24/2009   PLT 308 04/24/2009      Component Value Date/Time   NA 141 04/24/2009 1036   K 4.3 04/24/2009 1036   CL 104 04/24/2009 1036   CO2 28 04/24/2009 1036   GLUCOSE 101 (H) 04/24/2009 1036   BUN 16 04/24/2009 1036   CREATININE 0.78 04/24/2009 1036   CALCIUM 9.9 04/24/2009 1036   PROT 7.0 04/24/2009 1036   ALBUMIN 4.4 04/24/2009 1036   AST 21 04/24/2009 1036   ALT 23 04/24/2009 1036   ALKPHOS 30 (L) 04/24/2009 1036   BILITOT 0.7 04/24/2009 1036   GFRNONAA >60 04/24/2009 1036   GFRAA  04/24/2009 1036    >60        The eGFR has been calculated using the MDRD equation. This calculation has not been validated in  all clinical situations. eGFR's persistently <60 mL/min signify possible Chronic Kidney Disease.       ASSESSMENT AND PLAN  Dysesthesia  Foot pain, left  Superficial peroneal nerve neuropathy, left   1.   Continue lamotrigine 50 mg po bid and add oxcarbazepine 300 mg bid  .   If not better, consider stopping and adding low-dose nortriptyline.     2.   Stay active and exercise as tolerated.   3.    He will return to see me as needed. He should call if he has worsening pain or other new symptoms.   Bannon Giammarco A. Felecia Shelling, MD, PhD 16/02/9603, 5:40 PM Certified in Neurology, Clinical Neurophysiology, Sleep Medicine, Pain Medicine and Neuroimaging  [facility] [address] [place], [place] [address] [phone]

## 2017-03-28 ENCOUNTER — Telehealth: Payer: Self-pay | Admitting: Neurology

## 2017-03-28 NOTE — Telephone Encounter (Signed)
Pt wife calling re:Oxcarbazepine (TRILEPTAL) 300 MG tablet, she states it has pt very dizzy and wants to know if it can be cut so he takes 1/2 a tablet, please call

## 2017-03-31 MED ORDER — TRAMADOL HCL 50 MG PO TABS: ORAL_TABLET | ORAL | 1 refills | Status: DC

## 2017-03-31 NOTE — Addendum Note (Signed)
Addended by: France Ravens I on: 03/31/2017 04:30 PM   Modules accepted: Orders

## 2017-03-31 NOTE — Telephone Encounter (Signed)
Pt called he has broken out in a rash on back, and shoulders starting yesterday morning. It does itch, rash is raised bumps. Pt did not take any medication yesterday or today. Please call asap

## 2017-03-31 NOTE — Telephone Encounter (Signed)
#   busy/fim 

## 2017-03-31 NOTE — Telephone Encounter (Signed)
Spoke with Derrick White per RAS, offered Tramadol 50mg , 1/2 to 1 tablet tid prn pain.  He is agreeable with this--doesn't recall ever having taken Tramadol.  Rx. faxed to [facility] on [address] per his request/fim

## 2017-03-31 NOTE — Telephone Encounter (Signed)
Spoke with Mr. Derrick White.  He sts. he developed dizziness, now a rash with Oxcarbazepine.  Has stopped it and continued Lamictal--taking 3/4 tab (75mg ) bid.  Was not able to tolerate Gabapentin or Lyrica either.  Will check with RAS for other options and call pt. back/fim

## 2017-05-07 DIAGNOSIS — Z6826 Body mass index (BMI) 26.0-26.9, adult: Secondary | ICD-10-CM | POA: Diagnosis not present

## 2017-05-07 DIAGNOSIS — R0789 Other chest pain: Secondary | ICD-10-CM | POA: Diagnosis not present

## 2017-05-15 DIAGNOSIS — B029 Zoster without complications: Secondary | ICD-10-CM | POA: Diagnosis not present

## 2017-06-03 DIAGNOSIS — L111 Transient acantholytic dermatosis [Grover]: Secondary | ICD-10-CM | POA: Diagnosis not present

## 2017-06-03 DIAGNOSIS — B029 Zoster without complications: Secondary | ICD-10-CM | POA: Diagnosis not present

## 2017-07-21 DIAGNOSIS — B356 Tinea cruris: Secondary | ICD-10-CM | POA: Diagnosis not present

## 2017-07-21 DIAGNOSIS — L570 Actinic keratosis: Secondary | ICD-10-CM | POA: Diagnosis not present

## 2017-07-21 DIAGNOSIS — L817 Pigmented purpuric dermatosis: Secondary | ICD-10-CM | POA: Diagnosis not present

## 2017-07-21 DIAGNOSIS — D2362 Other benign neoplasm of skin of left upper limb, including shoulder: Secondary | ICD-10-CM | POA: Diagnosis not present

## 2017-07-21 DIAGNOSIS — D225 Melanocytic nevi of trunk: Secondary | ICD-10-CM | POA: Diagnosis not present

## 2017-07-22 DIAGNOSIS — N401 Enlarged prostate with lower urinary tract symptoms: Secondary | ICD-10-CM | POA: Diagnosis not present

## 2017-07-22 DIAGNOSIS — E7800 Pure hypercholesterolemia, unspecified: Secondary | ICD-10-CM | POA: Diagnosis not present

## 2017-07-22 DIAGNOSIS — M859 Disorder of bone density and structure, unspecified: Secondary | ICD-10-CM | POA: Diagnosis not present

## 2017-07-22 DIAGNOSIS — M5136 Other intervertebral disc degeneration, lumbar region: Secondary | ICD-10-CM | POA: Diagnosis not present

## 2017-07-22 DIAGNOSIS — K219 Gastro-esophageal reflux disease without esophagitis: Secondary | ICD-10-CM | POA: Diagnosis not present

## 2017-07-22 DIAGNOSIS — N182 Chronic kidney disease, stage 2 (mild): Secondary | ICD-10-CM | POA: Diagnosis not present

## 2017-07-22 DIAGNOSIS — I129 Hypertensive chronic kidney disease with stage 1 through stage 4 chronic kidney disease, or unspecified chronic kidney disease: Secondary | ICD-10-CM | POA: Diagnosis not present

## 2017-07-22 DIAGNOSIS — G4709 Other insomnia: Secondary | ICD-10-CM | POA: Diagnosis not present

## 2017-07-22 DIAGNOSIS — R808 Other proteinuria: Secondary | ICD-10-CM | POA: Diagnosis not present

## 2017-07-22 DIAGNOSIS — Z6826 Body mass index (BMI) 26.0-26.9, adult: Secondary | ICD-10-CM | POA: Diagnosis not present

## 2017-07-29 DIAGNOSIS — H524 Presbyopia: Secondary | ICD-10-CM | POA: Diagnosis not present

## 2017-07-29 DIAGNOSIS — H25011 Cortical age-related cataract, right eye: Secondary | ICD-10-CM | POA: Diagnosis not present

## 2017-07-29 DIAGNOSIS — H2513 Age-related nuclear cataract, bilateral: Secondary | ICD-10-CM | POA: Diagnosis not present

## 2017-07-29 DIAGNOSIS — H04123 Dry eye syndrome of bilateral lacrimal glands: Secondary | ICD-10-CM | POA: Diagnosis not present

## 2017-08-13 ENCOUNTER — Other Ambulatory Visit: Payer: Self-pay | Admitting: Neurology

## 2017-08-15 ENCOUNTER — Other Ambulatory Visit: Payer: Self-pay | Admitting: Neurology

## 2017-09-08 ENCOUNTER — Other Ambulatory Visit: Payer: Self-pay | Admitting: *Deleted

## 2017-09-08 MED ORDER — LAMOTRIGINE 100 MG PO TABS: 100.0000 mg | ORAL_TABLET | Freq: Two times a day (BID) | ORAL | 3 refills | Status: DC

## 2017-09-15 ENCOUNTER — Other Ambulatory Visit: Payer: Self-pay

## 2017-09-15 ENCOUNTER — Encounter: Payer: Self-pay | Admitting: Neurology

## 2017-09-15 ENCOUNTER — Ambulatory Visit (INDEPENDENT_AMBULATORY_CARE_PROVIDER_SITE_OTHER): Payer: Medicare Other | Admitting: Neurology

## 2017-09-15 VITALS — BP 114/74 | HR 94 | Resp 18 | Ht 70.0 in | Wt 120.0 lb

## 2017-09-15 DIAGNOSIS — G5732 Lesion of lateral popliteal nerve, left lower limb: Secondary | ICD-10-CM | POA: Diagnosis not present

## 2017-09-15 DIAGNOSIS — R208 Other disturbances of skin sensation: Secondary | ICD-10-CM | POA: Diagnosis not present

## 2017-09-15 MED ORDER — OXCARBAZEPINE 300 MG PO TABS: 300.0000 mg | ORAL_TABLET | Freq: Two times a day (BID) | ORAL | 11 refills | Status: DC

## 2017-09-15 NOTE — Progress Notes (Signed)
[facility]  PATIENT: Derrick White DOB: 06/13/1940  REFERRING DOCTOR OR PCP:  Domenick Gong SOURCE: patient, records in EPIC from [place] and Neurology, MRI results, EMG/NCV results  _________________________________   HISTORICAL  CHIEF COMPLAINT:  Chief Complaint  Patient presents with  . Left Foot/Ankle Pain    Sts. shooting pain in left foot is some better since adding Oxcarbazepine.  Can only take Oxcarbazepine 3/4 tab bid--higher doses cause dizziness/fim    HISTORY OF PRESENT ILLNESS:  Derrick White is a 77 yo man with left ankle and foot pain.    Update 09/15/2017: He still has some shooting pain in the top of the feet into the toes.   He has dysesthetic pain with allodynia and hyperpathia, worse in the peroneal more than sural distribution.     He feels they probably occur less frequently on the medications.  He is currently 3/4 of a trileptal bid.    He stopped lamotrigine as oxcarbazepine works better.     He occasionally uses lidocaine.   Hot water seems to help for a while.     He has had some operations in his feet to see if the nerves being clipped would help but it has not.     Update 03/17/2017:   He noted some improvement with lamotrigine initially but a higher dose did not help any more.     The pain is in the feet, worse in the toes and milder at the ankles.    There is allodynia as well as tingling pain.   He feels mildly weak in his feet,    Lyrica was not tolerated.   He has not tried Trileptal.     From 08/08/2016:   He is much better on lamotrigine and is on 50 mg po bid.   He feels he is 60% better.  He still has some pain on the dorsum between the first and second toes but this is much better. He now does not note allodynia with light touch. He has just slight pain with pressure between the first and second, second and third toes.   Pain had been worse old and slightly better with heat but he notes less of the fluctuations now..  Pain is better when he has his feet up.  He feels the current intensity of pain is tolerable.  History:   He broke his left ankle in 2012 or 2013 requiring plate and screws.   He later had the hardware removed without benefit.    A year leater, he had surgery to decompress the peroneal nerve.    He was referred to [facility] and had the left peroneal neurectomy and removal of  L3L4 Morton's Neuroma and L2L3 intermetatarsal nerve decompression procedures.    He was tried on Lyrica but felt very off balanced and mentally slowed so he stopped.  He had mild benefit from lidocaine containing ointments. A couple different types of ointments were tried and the composition of all of them is unknown.     A nerve conduction study performed 05/09/2016 at [facility] showed an absent left superficial peroneal sensory response (was present on the right). Sural sensory response was normal. Left peroneal motor response was normal.  I have reviewed the MRI of the cervical spine dated 01/10/2009. 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 There is multilevel foraminal  narrowing, worse to the right at C5-C6 and C6-C7 where there is possible nerve root compression.   Subsequently, a few months later,  he had cervical fusion.    REVIEW OF SYSTEMS: Constitutional: No fevers, chills, sweats, or change in appetite Eyes: No visual changes, double vision, eye pain Ear, nose and throat: No hearing loss, ear pain, nasal congestion, sore throat Cardiovascular: No chest pain, palpitations Respiratory: No shortness of breath at rest or with exertion.   No wheezes GastrointestinaI: No nausea, vomiting, diarrhea, abdominal pain, fecal incontinence.   Has GERD Genitourinary: No dysuria, urinary retention or frequency.  No nocturia. Musculoskeletal: No current neck pain, back pain Integumentary: No  rash, pruritus, skin lesions Neurological: as above Psychiatric: No depression at this time.  No anxiety Endocrine: No palpitations, diaphoresis, change in appetite, change in weigh or increased thirst Hematologic/Lymphatic: No anemia, purpura, petechiae. Allergic/Immunologic: No itchy/runny eyes, nasal congestion, recent allergic reactions, rashes  ALLERGIES: Allergies  Allergen Reactions  . Codeine Hives and Itching  . Morphine Hives and Itching  . Other Other (See Comments)    Shrimp - rash Darvocet - itching    HOME MEDICATIONS:  Current Outpatient Medications:  .  calcium carbonate 1250 MG capsule, Take 1,250 mg by mouth 2 (two) times daily with a meal., Disp: , Rfl:  .  Cholecalciferol (VITAMIN D-1000 MAX ST) 1000 units tablet, Take by mouth., Disp: , Rfl:  .  cyanocobalamin 1000 MCG tablet, Take 1,000 mcg by mouth daily., Disp: , Rfl:  .  lamoTRIgine (LAMICTAL) 100 MG tablet, Take 1 tablet (100 mg total) by mouth 2 (two) times daily., Disp: 180 tablet, Rfl: 3 .  lidocaine (XYLOCAINE) 5 % ointment, Apply 1 application topically as needed. Apply to left foot twice a day, Disp: 50 g, Rfl: 5 .  Multiple Vitamins-Minerals (CENTRUM SILVER PO), Take by mouth., Disp: , Rfl:  .  Oxcarbazepine (TRILEPTAL) 300 MG tablet, Take 1 tablet (300 mg total) by mouth 2 (two) times daily., Disp: 60 tablet, Rfl: 11 .  pantoprazole (PROTONIX) 40 MG tablet, TK 1 T PO QD, Disp: , Rfl: 2 .  pravastatin (PRAVACHOL) 40 MG tablet, TK 1 T PO QD, Disp: , Rfl: 2 .  telmisartan (MICARDIS) 80 MG tablet, , Disp: , Rfl:  .  traMADol (ULTRAM) 50 MG tablet, Take 1/2 to 1 tablet by mouth 3 times daily as needed for pain, Disp: 90 tablet, Rfl: 1  PAST MEDICAL HISTORY: History reviewed. No pertinent past medical history.  PAST SURGICAL HISTORY: See above  FAMILY HISTORY: History reviewed. No pertinent family history.  SOCIAL HISTORY:  Social History   Socioeconomic History  . Marital status: Married     Spouse name: Not on file  . Number of children: Not on file  . Years of education: Not on file  . Highest education level: Not on file  Occupational History  . Not on file  Social Needs  . Financial resource strain: Not on file  . Food insecurity:    Worry: Not on file    Inability: Not on file  . Transportation needs:    Medical: Not on file    Non-medical: Not on file  Tobacco Use  . Smoking status: Former Research scientist (life sciences)  . Smokeless tobacco: Never Used  Substance and Sexual Activity  . Alcohol use: Not on file  . Drug use: Not on file  . Sexual activity: Not on file  Lifestyle  . Physical activity:    Days per week: Not on  file    Minutes per session: Not on file  . Stress: Not on file  Relationships  . Social connections:    Talks on phone: Not on file    Gets together: Not on file    Attends religious service: Not on file    Active member of club or organization: Not on file    Attends meetings of clubs or organizations: Not on file    Relationship status: Not on file  . Intimate partner violence:    Fear of current or ex partner: Not on file    Emotionally abused: Not on file    Physically abused: Not on file    Forced sexual activity: Not on file  Other Topics Concern  . Not on file  Social History Narrative  . Not on file     PHYSICAL EXAM  Vitals:   09/15/17 0947  BP: 114/74  Pulse: 94  Resp: 18  Weight: 120 lb (54.4 kg)  Height: '5\' 10"'$  (1.778 m)    Body mass index is 17.22 kg/m.   General: The patient is well-developed and well-nourished and in no acute distress    Skin/Ext: Extremities are without rash or edema.   Surgical scars well healed on left foot  Neurologic Exam  Mental status: The patient is alert and oriented x 3 at the time of the examination. The patient has apparent normal recent and remote memory, with an apparently normal attention span and concentration ability.   Speech is normal.  Cranial nerves: Extraocular movements are full.   Facial strength is normal.    No obvious hearing deficits are noted.  Motor:  Muscle bulk is normal.   Tone is normal. Strength is  5 / 5 in all 4 extremities.   Sensory:   There is reduced sensation with allodynia in the distribution of the superficial peroneal nerve of the left foot sensation is normal in the right foot.  There are much milder symptoms in the sural distribution.  He also had mildly reduced vibration sensation at the left great toe.   Coordination: Cerebellar testing reveals good finger-nose-finger good heel-to-shin bilaterally.  Gait and station: Station is normal.   Gait is mildly wide based favoring left tender foot.  Romberg is negative.   Reflexes: Deep tendon reflexes are symmetric and 3 at knees and 1 at ankles.       DIAGNOSTIC DATA (LABS, IMAGING, TESTING) - I reviewed patient records, labs, notes, testing and imaging myself where available.  Lab Results  Component Value Date   WBC 7.2 04/24/2009   HGB 16.3 04/24/2009   HCT 46.2 04/24/2009   MCV 96.7 04/24/2009   PLT 308 04/24/2009      Component Value Date/Time   NA 141 04/24/2009 1036   K 4.3 04/24/2009 1036   CL 104 04/24/2009 1036   CO2 28 04/24/2009 1036   GLUCOSE 101 (H) 04/24/2009 1036   BUN 16 04/24/2009 1036   CREATININE 0.78 04/24/2009 1036   CALCIUM 9.9 04/24/2009 1036   PROT 7.0 04/24/2009 1036   ALBUMIN 4.4 04/24/2009 1036   AST 21 04/24/2009 1036   ALT 23 04/24/2009 1036   ALKPHOS 30 (L) 04/24/2009 1036   BILITOT 0.7 04/24/2009 1036   GFRNONAA >60 04/24/2009 1036   GFRAA  04/24/2009 1036    >60        The eGFR has been calculated using the MDRD equation. This calculation has not been validated in all clinical situations. eGFR's persistently <60 mL/min  signify possible Chronic Kidney Disease.       ASSESSMENT AND PLAN  Dysesthesia  Superficial peroneal nerve neuropathy, left   1.    He seems to be doing better on oxcarbazepine and then on Myrbetriq again.  He is  currently on relatively low dose at 225 mg twice a day and will increase to 300 mg twice a day.  We discussed slowly going up to 450 mg twice a day. 2.   Stay active and exercise as tolerated.   3.    He will return to see me in 1 year or sooner if there are new or worsening neurologic symptoms.Marland Kitchen He should call if he has worsening pain or other new symptoms.   Richard A. Felecia Shelling, MD, PhD 10/15/4130, 44:01 AM Certified in Neurology, Clinical Neurophysiology, Sleep Medicine, Pain Medicine and Neuroimaging  [facility] [address], [place], [place] [address] [phone]

## 2017-09-18 ENCOUNTER — Telehealth: Payer: Self-pay | Admitting: Neurology

## 2017-09-18 NOTE — Telephone Encounter (Signed)
Pt's wife called to request a handicap placard because of pt's ankle and difficulty to walk later in the day. Please call to advise

## 2017-09-18 NOTE — Telephone Encounter (Signed)
Spoke with Mr. Zawadzki and advised we can give him an application for a handicap parking placard.  He is agreeable, will pick up form from our office tomorrow.  Signed handicap placard application up front [facility]

## 2017-10-07 ENCOUNTER — Encounter (INDEPENDENT_AMBULATORY_CARE_PROVIDER_SITE_OTHER): Payer: Self-pay | Admitting: Orthopaedic Surgery

## 2017-10-07 ENCOUNTER — Ambulatory Visit (INDEPENDENT_AMBULATORY_CARE_PROVIDER_SITE_OTHER): Payer: Medicare Other | Admitting: Orthopaedic Surgery

## 2017-10-07 ENCOUNTER — Ambulatory Visit (INDEPENDENT_AMBULATORY_CARE_PROVIDER_SITE_OTHER): Payer: Medicare Other

## 2017-10-07 ENCOUNTER — Other Ambulatory Visit (INDEPENDENT_AMBULATORY_CARE_PROVIDER_SITE_OTHER): Payer: Self-pay | Admitting: Radiology

## 2017-10-07 VITALS — BP 132/68 | HR 67 | Resp 18 | Ht 71.0 in | Wt 175.0 lb

## 2017-10-07 DIAGNOSIS — G8929 Other chronic pain: Secondary | ICD-10-CM | POA: Diagnosis not present

## 2017-10-07 DIAGNOSIS — M545 Low back pain: Secondary | ICD-10-CM

## 2017-10-07 DIAGNOSIS — I77819 Aortic ectasia, unspecified site: Secondary | ICD-10-CM

## 2017-10-07 NOTE — Progress Notes (Signed)
Office Visit Note   Patient: Derrick White           Date of Birth: 1941/02/04           MRN: [id number] Visit Date: 10/07/2017              Requested by: Haywood Pao, MD [address] [place], [place] [address] PCP: Haywood Pao, MD   Assessment & Plan: Visit Diagnoses:  1. 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  I will order an MRI scan of the lumbar spine.  I will also order an ultrasound of the aorta as there is some widening and follow-up in the office after the above studies  Follow-Up Instructions: No follow-ups on file.   Orders:  Orders Placed This Encounter  Procedures  . XR Lumbar Spine 2-3 Views   No orders of the defined types were placed in this encounter.     Procedures: No procedures performed   Clinical Data: No additional findings.   Subjective: Chief Complaint  Patient presents with  . Lower Back - Pain  . New Patient (Initial Visit)    BACK PAIN FEW WEEKS, NO INJURY  Derrick White is 77 years old and visits the office for evaluation of low back pain.  He noted insidious onset about 3 weeks ago without history of injury or trauma.  Pain begins in the low back will radiate to the left sacroiliac area.  He has not had any referred pain or obvious radiculopathy but does note that when he is on his feet for a length of time his legs will feel heavy and tired.  He did have low back surgery 30 to 40 years ago and is done well.  He has had prior cervical spine surgery for degenerative changes. His past history is also significant for issues referable to his left lower extremity.  He underwent open reduction internal fixation of a left ankle fracture years ago and developed a peroneal neuroma.  He has had initial decompression and then eventually had excision of the superficial branch of the peroneal nerve just above the ankle performed at [facility] about 2  years ago.  He still having some altered sensibility and pain in the dorsum of his left foot is being followed by neurology and treated medically.  That is a constant irritation but at least he can "live with it".  HPI  Review of Systems  Constitutional: Negative for fatigue and fever.  HENT: Negative for ear pain.   Eyes: Negative for pain.  Respiratory: Negative for cough and shortness of breath.   Cardiovascular: Negative for leg swelling.  Gastrointestinal: Negative for constipation and diarrhea.  Genitourinary: Negative for difficulty urinating.  Musculoskeletal: Positive for back pain. Negative for neck pain.  Skin: Negative for rash.  Allergic/Immunologic: Negative for food allergies.  Neurological: Positive for weakness and numbness.  Hematological: Does not bruise/bleed easily.  Psychiatric/Behavioral: Positive for sleep disturbance.     Objective: Vital Signs: BP 132/68 (BP Location: Left Arm, Patient Position: Sitting, Cuff Size: Normal)   Pulse 67   Resp 18   Ht 5\' 11"  (1.803 m)   Wt 175 lb (79.4 kg)   BMI 24.41 kg/m   Physical Exam  Constitutional: He is oriented to person, place, and time. He appears well-developed and well-nourished.  HENT:  Mouth/Throat: Oropharynx is clear and moist.  Eyes: Pupils are equal, round, and reactive  to light. EOM are normal.  Pulmonary/Chest: Effort normal.  Neurological: He is alert and oriented to person, place, and time.  Skin: Skin is warm and dry.  Psychiatric: He has a normal mood and affect. His behavior is normal.    Ortho Exam awake alert and oriented x3.  Comfortable sitting.  Speech is just a little garbled.  Pupils are equal but narrowed.  Straight leg raise is negative bilaterally.  Some mild percussible tenderness at the lumbosacral junction and just to the left of the lumbosacral junction.  No masses.  No specific pain over the sacroiliac joint either right or left.  Painless range of motion of both hips.  Altered  sensibility in the distribution of the superficial peroneal nerve left foot.  Prior surgical incisions have healed.  Has a positive Tinel's over the area where the nerve was resected above the ankle is not referred to the dorsum of the foot motor exam appears to be intact  Specialty Comments:  No specialty comments available.  Imaging: No results found.   PMFS History: Patient Active Problem List   Diagnosis Date Noted  . Foot pain, 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  . Dysesthesia 06/04/2016  . 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 History of cervical spinal surgery 06/04/2016   History reviewed. No pertinent past medical history.  History reviewed. No pertinent family history.  History reviewed. No pertinent surgical history. Social History   Occupational History  . Not on file  Tobacco Use  . Smoking status: Former Research scientist (life sciences)  . Smokeless tobacco: Never Used  Substance and Sexual Activity  . Alcohol use: Not on file  . Drug use: Not on file  . Sexual activity: Not on file

## 2017-10-10 ENCOUNTER — Ambulatory Visit (HOSPITAL_COMMUNITY): Payer: Medicare Other

## 2017-10-17 ENCOUNTER — Ambulatory Visit (HOSPITAL_COMMUNITY)
Admission: RE | Admit: 2017-10-17 | Discharge: 2017-10-17 | Disposition: A | Discharge status: Home / Self Care | Payer: Medicare Other | Source: Ambulatory Visit | Attending: Orthopaedic Surgery | Admitting: Orthopaedic Surgery

## 2017-10-17 DIAGNOSIS — I77819 Aortic ectasia, unspecified site: Secondary | ICD-10-CM | POA: Insufficient documentation

## 2017-10-17 DIAGNOSIS — M545 Low back pain: Secondary | ICD-10-CM

## 2017-10-17 DIAGNOSIS — Z981 Arthrodesis status: Secondary | ICD-10-CM | POA: Insufficient documentation

## 2017-10-17 DIAGNOSIS — Z9889 Other specified postprocedural states: Secondary | ICD-10-CM | POA: Insufficient documentation

## 2017-10-17 DIAGNOSIS — G8929 Other chronic pain: Secondary | ICD-10-CM

## 2017-10-17 DIAGNOSIS — M2578 Osteophyte, vertebrae: Secondary | ICD-10-CM | POA: Diagnosis not present

## 2017-10-17 DIAGNOSIS — Z136 Encounter for screening for cardiovascular disorders: Secondary | ICD-10-CM | POA: Diagnosis not present

## 2017-10-20 ENCOUNTER — Encounter (INDEPENDENT_AMBULATORY_CARE_PROVIDER_SITE_OTHER): Payer: Self-pay | Admitting: Orthopaedic Surgery

## 2017-10-20 ENCOUNTER — Other Ambulatory Visit (INDEPENDENT_AMBULATORY_CARE_PROVIDER_SITE_OTHER): Payer: Self-pay | Admitting: *Deleted

## 2017-10-20 ENCOUNTER — Ambulatory Visit (INDEPENDENT_AMBULATORY_CARE_PROVIDER_SITE_OTHER): Payer: Medicare Other | Admitting: Orthopaedic Surgery

## 2017-10-20 VITALS — BP 130/70 | HR 70 | Ht 70.5 in | Wt 175.0 lb

## 2017-10-20 DIAGNOSIS — M5442 Lumbago with sciatica, left side: Secondary | ICD-10-CM | POA: Diagnosis not present

## 2017-10-20 DIAGNOSIS — M545 Low back pain, unspecified: Secondary | ICD-10-CM

## 2017-10-20 DIAGNOSIS — G8929 Other chronic pain: Secondary | ICD-10-CM

## 2017-10-20 NOTE — Progress Notes (Signed)
Office Visit Note   Patient: Derrick White           Date of Birth: 05/15/1941           MRN: [id number] Visit Date: 10/20/2017              Requested by: Haywood Pao, MD [address] [place], [place] [address] PCP: Haywood Pao, MD   Assessment & Plan: Visit Diagnoses:  1. Chronic left-sided low back pain with left-sided sciatica     Plan: MRI scan reveals some degenerative changes at L5-S1.  Prior hemilaminectomy many years ago at that same level on the left.  Symptomatically having trouble with the left side of his back and referred pain to his left thigh.  I think it is worth having Dr. Ernestina Patches to consider injecting the disc space or the facet joint or possibly even an epidural steroid injection.  The Clune's are planning a trip next week and would like to feel better  Follow-Up Instructions: Return if symptoms worsen or fail to improve.   Orders:  No orders of the defined types were placed in this encounter.  No orders of the defined types were placed in this encounter.     Procedures: No procedures performed   Clinical Data: No additional findings.   Subjective: Chief Complaint  Patient presents with  . Lower Back - Pain  . Follow-up    MRI L SPINE REVIEW  No change in symptoms.  Having predominantly left-sided low back pain, left buttock pain referred pain to the left thigh.  No bowel or bladder changes.  No discomfort on the right  HPI  Review of Systems  Constitutional: Negative for fatigue and fever.  HENT: Negative for ear pain.   Eyes: Negative for pain.  Respiratory: Negative for cough and shortness of breath.   Cardiovascular: Negative for leg swelling.  Gastrointestinal: Negative for constipation and diarrhea.  Genitourinary: Negative for difficulty urinating.  Musculoskeletal: Positive for back pain. Negative for neck pain.  Skin: Negative for rash.  Allergic/Immunologic: Negative for food allergies.  Neurological: Positive  for weakness and numbness.  Hematological: Does not bruise/bleed easily.  Psychiatric/Behavioral: Positive for sleep disturbance.     Objective: Vital Signs: BP 130/70 (BP Location: Left Arm, Patient Position: Sitting, Cuff Size: Normal)   Pulse 70   Ht 5' 10.5" (1.791 m)   Wt 175 lb (79.4 kg)   BMI 24.75 kg/m   Physical Exam  Constitutional: He is oriented to person, place, and time. He appears well-developed and well-nourished.  HENT:  Mouth/Throat: Oropharynx is clear and moist.  Eyes: Pupils are equal, round, and reactive to light. EOM are normal.  Pulmonary/Chest: Effort normal.  Neurological: He is alert and oriented to person, place, and time.  Skin: Skin is warm and dry.  Psychiatric: He has a normal mood and affect. His behavior is normal.    Ortho Exam awake alert and oriented x3.  Comfortable sitting.  Straight leg raise minimally positive on the left at 90 degrees for left back and leg pain.  Negative on the right.  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  No knee pain.  No groin pain with range of motion of his left leg.  Mild percussible tenderness lower lumbar spine at the lumbosacral junction  Specialty Comments:  No specialty comments available.  Imaging: No results found.   PMFS History: Patient Active Problem List  Diagnosis Date Noted  . Foot pain, 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  . Dysesthesia 06/04/2016  . 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 History of cervical spinal surgery 06/04/2016   History reviewed. No pertinent past medical history.  History reviewed. No pertinent family history.  History reviewed. No pertinent surgical history. Social History   Occupational History  . Not on file  Tobacco Use  . Smoking status: Former Research scientist (life sciences)  . Smokeless tobacco: Never Used  Substance and Sexual Activity  . Alcohol use: Not Currently    Alcohol/week: 0.0 oz  . Drug use: Not  Currently  . Sexual activity: Not on file

## 2017-10-21 ENCOUNTER — Ambulatory Visit (INDEPENDENT_AMBULATORY_CARE_PROVIDER_SITE_OTHER): Payer: Medicare Other | Admitting: Physical Medicine and Rehabilitation

## 2017-10-21 ENCOUNTER — Ambulatory Visit (INDEPENDENT_AMBULATORY_CARE_PROVIDER_SITE_OTHER): Payer: Medicare Other

## 2017-10-21 ENCOUNTER — Encounter (INDEPENDENT_AMBULATORY_CARE_PROVIDER_SITE_OTHER): Payer: Self-pay | Admitting: Physical Medicine and Rehabilitation

## 2017-10-21 VITALS — BP 136/79 | HR 68

## 2017-10-21 DIAGNOSIS — M5416 Radiculopathy, lumbar region: Secondary | ICD-10-CM | POA: Diagnosis not present

## 2017-10-21 MED ORDER — BETAMETHASONE SOD PHOS & ACET 6 (3-3) MG/ML IJ SUSP
12.0000 mg | Freq: Once | INTRAMUSCULAR | Status: AC
  Administered 2017-10-21: 12 mg

## 2017-10-21 NOTE — Patient Instructions (Signed)

## 2017-10-21 NOTE — Progress Notes (Signed)
 .  Numeric Pain Rating Scale and Functional Assessment Average Pain 9   In the last MONTH (on 0-10 scale) has pain interfered with the following?  1. General activity like being  able to carry out your everyday physical activities such as walking, climbing stairs, carrying groceries, or moving a chair?  Rating(5)   +Driver, -BT, -Dye Allergies.  

## 2017-11-11 ENCOUNTER — Other Ambulatory Visit: Payer: Self-pay | Admitting: Internal Medicine

## 2017-11-11 ENCOUNTER — Ambulatory Visit
Admission: RE | Admit: 2017-11-11 | Discharge: 2017-11-11 | Disposition: A | Discharge status: Home / Self Care | Payer: Medicare Other | Source: Ambulatory Visit | Attending: Internal Medicine | Admitting: Internal Medicine

## 2017-11-11 DIAGNOSIS — R42 Dizziness and giddiness: Secondary | ICD-10-CM | POA: Diagnosis not present

## 2017-11-11 DIAGNOSIS — R4781 Slurred speech: Secondary | ICD-10-CM

## 2017-11-11 DIAGNOSIS — G9050 Complex regional pain syndrome I, unspecified: Secondary | ICD-10-CM | POA: Diagnosis not present

## 2017-11-11 DIAGNOSIS — K117 Disturbances of salivary secretion: Secondary | ICD-10-CM

## 2017-11-11 DIAGNOSIS — Z6826 Body mass index (BMI) 26.0-26.9, adult: Secondary | ICD-10-CM | POA: Diagnosis not present

## 2017-11-11 DIAGNOSIS — N182 Chronic kidney disease, stage 2 (mild): Secondary | ICD-10-CM | POA: Diagnosis not present

## 2017-11-11 DIAGNOSIS — I10 Essential (primary) hypertension: Secondary | ICD-10-CM | POA: Diagnosis not present

## 2017-11-28 NOTE — Progress Notes (Signed)
Derrick White - 77 y.o. male MRN [id number]  Date of birth: [birth year]  Office Visit Note: Visit Date: 10/21/2017 PCP: Haywood Pao, MD Referred by: Haywood Pao, MD  Subjective: Chief Complaint  Patient presents with  . Lower Back - Pain  . Left Leg - Pain   HPI: Derrick White is a 77 year old gentleman who comes in today at the request of Dr. Joni Fears for a few weeks of acute onset of left radicular leg pain down the leg into the foot.  Somewhat of an S1 distribution.  Patient has had MRI of the lumbar spine with contrast and has had prior hemilaminectomy at L5-S1 on the left.  There is some fibrosis around the S1 nerve root but no compression.  We will try diagnostic and hopefully therapeutic left S1 transforaminal epidural steroid injection.  If this were to be more of a chronic situation would consider spinal cord stimulator for this condition.   ROS Otherwise per HPI.  Assessment & Plan: Visit Diagnoses:  1. Lumbar radiculopathy     Plan: No additional findings.   Meds & Orders:  Meds ordered this encounter  Medications  . betamethasone acetate-betamethasone sodium phosphate (CELESTONE) injection 12 mg    Orders Placed This Encounter  Procedures  . XR C-ARM NO REPORT  . Epidural Steroid injection    Follow-up: Return if symptoms worsen or fail to improve.   Procedures: No procedures performed  S1 Lumbosacral Transforaminal Epidural Steroid Injection - Sub-Pedicular Approach with Fluoroscopic Guidance   Patient: Derrick White      Date of Birth: [birth year] MRN: [id number] PCP: Haywood Pao, MD      Visit Date: 10/21/2017   Universal Protocol:    Date/Time: 07/12/196:22 AM  Consent Given By: the patient  Position:  PRONE  Additional Comments: Vital signs were monitored before and after the procedure. Patient was prepped and draped in the usual sterile fashion. The correct patient, procedure, and site was verified.   Injection  Procedure Details:  Procedure Site One Meds Administered:  Meds ordered this encounter  Medications  . betamethasone acetate-betamethasone sodium phosphate (CELESTONE) injection 12 mg    Laterality: Left  Location/Site:  S1 Foramen   Needle size: 22 ga.  Needle type: Spinal  Needle Placement: Transforaminal  Findings:   -Comments: Excellent flow of contrast along the nerve and into the epidural space.  Procedure Details: After squaring off the sacral end-plate to get a true AP view, the C-arm was positioned so that the best possible view of the S1 foramen was visualized. The soft tissues overlying this structure were infiltrated with 2-3 ml. of 1% Lidocaine without Epinephrine.    The spinal needle was inserted toward the target using a "trajectory" view along the fluoroscope beam.  Under AP and lateral visualization, the needle was advanced so it did not puncture dura. Biplanar projections were used to confirm position. Aspiration was confirmed to be negative for CSF and/or blood. A 1-2 ml. volume of Isovue-250 was injected and flow of contrast was noted at each level. Radiographs were obtained for documentation purposes.   After attaining the desired flow of contrast documented above, a 0.5 to 1.0 ml test dose of 0.25% Marcaine was injected into each respective transforaminal space.  The patient was observed for 90 seconds post injection.  After no sensory deficits were reported, and normal lower extremity motor function was noted,   the above injectate was administered so that equal amounts of the injectate  were placed at each foramen (level) into the transforaminal epidural space.   Additional Comments:  The patient tolerated the procedure well Dressing: Band-Aid    Post-procedure details: Patient was observed during the procedure. Post-procedure instructions were reviewed.  Patient left the clinic in stable condition.    Clinical History: MRI LUMBAR SPINE WITHOUT  CONTRAST  TECHNIQUE: Multiplanar, multisequence MR imaging of the lumbar spine was performed. No intravenous contrast was administered.  COMPARISON:  Radiography 10/07/2017  FINDINGS: Segmentation:  5 lumbar type vertebral bodies assumed.  Alignment:  Normal except for 2 mm retrolisthesis L5-S1.  Vertebrae:  No fracture or primary bone lesion.  Conus medullaris and cauda equina: Conus extends to the L1 level. Conus and cauda equina appear normal.  Paraspinal and other soft tissues: Negative  Disc levels:  No significant finding at L4-5 and above. Minimal age related T2 signal loss with very minimal annular bulging but no herniation or compressive stenosis. No significant facet arthropathy.  At L5-S1, there has been distant left hemilaminectomy. The disc shows moderate loss of height. There is mild chronic scarring and small endplate osteophytes on the left. No nerve compression is demonstrated. It is possible the left S1 nerve could be irritated by the chronic fibrosis and small endplate osteophytes. The findings appear quite chronic and likely stable.  IMPRESSION: Normal appearance for age at L4-5 and above.  At L5-S1, there has been distant left hemilaminectomy and discectomy. There is ordinary mild left-sided postoperative scarring and small left-sided endplate osteophytes. This would generally be considered to be a satisfactory appearance. One could not rule out the possibility that the left S1 nerve could be irritated, but there is no residual or recurrent disc herniation or definite nerve root compression.   Electronically Signed   By: Nelson Chimes M.D.   On: 10/18/2017 07:20   He reports that he has quit smoking. He has never used smokeless tobacco. No results for input(s): HGBA1C, LABURIC in the last 8760 hours.  Objective:  VS:  HT:    WT:   BMI:     BP:136/79  HR:68bpm  TEMP: ( )  RESP:  Physical Exam  Ortho Exam Imaging: No results  found.  Past Medical/Family/Surgical/Social History: Medications & Allergies reviewed per EMR, new medications updated. Patient Active Problem List   Diagnosis Date Noted  . Foot pain, 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  . Dysesthesia 06/04/2016  . 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 History of cervical spinal surgery 06/04/2016   History reviewed. No pertinent past medical history. History reviewed. No pertinent family history. History reviewed. No pertinent surgical history. Social History   Occupational History  . Not on file  Tobacco Use  . Smoking status: Former Research scientist (life sciences)  . Smokeless tobacco: Never Used  Substance and Sexual Activity  . Alcohol use: Not Currently    Alcohol/week: 0.0 oz  . Drug use: Not Currently  . Sexual activity: Not on file

## 2017-11-28 NOTE — Procedures (Signed)
S1 Lumbosacral Transforaminal Epidural Steroid Injection - Sub-Pedicular Approach with Fluoroscopic Guidance   Patient: Derrick White      Date of Birth: [birth year] MRN: [id number] PCP: Haywood Pao, MD      Visit Date: 10/21/2017   Universal Protocol:    Date/Time: 07/12/196:22 AM  Consent Given By: the patient  Position:  PRONE  Additional Comments: Vital signs were monitored before and after the procedure. Patient was prepped and draped in the usual sterile fashion. The correct patient, procedure, and site was verified.   Injection Procedure Details:  Procedure Site One Meds Administered:  Meds ordered this encounter  Medications  . betamethasone acetate-betamethasone sodium phosphate (CELESTONE) injection 12 mg    Laterality: Left  Location/Site:  S1 Foramen   Needle size: 22 ga.  Needle type: Spinal  Needle Placement: Transforaminal  Findings:   -Comments: Excellent flow of contrast along the nerve and into the epidural space.  Procedure Details: After squaring off the sacral end-plate to get a true AP view, the C-arm was positioned so that the best possible view of the S1 foramen was visualized. The soft tissues overlying this structure were infiltrated with 2-3 ml. of 1% Lidocaine without Epinephrine.    The spinal needle was inserted toward the target using a "trajectory" view along the fluoroscope beam.  Under AP and lateral visualization, the needle was advanced so it did not puncture dura. Biplanar projections were used to confirm position. Aspiration was confirmed to be negative for CSF and/or blood. A 1-2 ml. volume of Isovue-250 was injected and flow of contrast was noted at each level. Radiographs were obtained for documentation purposes.   After attaining the desired flow of contrast documented above, a 0.5 to 1.0 ml test dose of 0.25% Marcaine was injected into each respective transforaminal space.  The patient was observed for 90 seconds post  injection.  After no sensory deficits were reported, and normal lower extremity motor function was noted,   the above injectate was administered so that equal amounts of the injectate were placed at each foramen (level) into the transforaminal epidural space.   Additional Comments:  The patient tolerated the procedure well Dressing: Band-Aid    Post-procedure details: Patient was observed during the procedure. Post-procedure instructions were reviewed.  Patient left the clinic in stable condition.

## 2017-12-12 ENCOUNTER — Ambulatory Visit (INDEPENDENT_AMBULATORY_CARE_PROVIDER_SITE_OTHER): Payer: Medicare Other | Admitting: Neurology

## 2017-12-12 ENCOUNTER — Other Ambulatory Visit: Payer: Self-pay

## 2017-12-12 ENCOUNTER — Encounter: Payer: Self-pay | Admitting: Neurology

## 2017-12-12 ENCOUNTER — Encounter

## 2017-12-12 VITALS — BP 125/71 | HR 68 | Resp 18 | Ht 70.5 in | Wt 179.0 lb

## 2017-12-12 DIAGNOSIS — R208 Other disturbances of skin sensation: Secondary | ICD-10-CM

## 2017-12-12 DIAGNOSIS — G5732 Lesion of lateral popliteal nerve, left lower limb: Secondary | ICD-10-CM

## 2017-12-12 DIAGNOSIS — G3184 Mild cognitive impairment, so stated: Secondary | ICD-10-CM

## 2017-12-12 DIAGNOSIS — M79672 Pain in left foot: Secondary | ICD-10-CM | POA: Diagnosis not present

## 2017-12-12 HISTORY — DX: Mild cognitive impairment of uncertain or unknown etiology: G31.84

## 2017-12-12 MED ORDER — NORTRIPTYLINE HCL 10 MG PO CAPS: ORAL_CAPSULE | ORAL | 11 refills | Status: DC

## 2017-12-12 NOTE — Progress Notes (Signed)
[facility]  PATIENT: Derrick White DOB: 1941/05/13  REFERRING DOCTOR OR PCP:  Domenick Gong SOURCE: patient, records in EPIC from [place] and Neurology, MRI results, EMG/NCV results  _________________________________   HISTORICAL  CHIEF COMPLAINT:  Chief Complaint  Patient presents with  . Left Foot/Ankle Pain    Sts. he has decreased Oxcarbazpine to 1/2 tablet (181m) bid.  Sts. he had slurred speech and dizziness with 3072mbid./fim    HISTORY OF PRESENT ILLNESS:  Derrick White a 89o man with left ankle and foot pain.    Update 12/12/2017: He continues to have numbness and allodynia in the left foot.  At the last visit the dose of Trileptal was increased to 300 mg po bid but it made him dizzy and his voice slurred and he went back down to 1/2 pill bid.  The dizziness is much better but his voice is still slurred.    At the last visit, he said oxcarbazepine was helping more than the lamotrigine so he stopped the lamotrigine.  The previous visit, he said the lamotrigine was helping about 60%, however.   Lyrica was not tolerated.      He also uses lidocaine ointment without much benefit --- helps a bit but quickly wears off.  He has had many podiatric procedures on the left  He notes the slurred speech fluctuates and he has 5-10 minutes of severe slurred speech once a day, but better the last couple weeks.,  He had an MRI June 2019.   I personally reviewed the MRI of the brain.  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  There were no acute findings.  I asked about his short-term memory.  He feels his short-term memory is doing about the same but he notes that his wife sometimes tells him that he never remembers things.    Mood is doing well.  He feels he sleeps well at night.    Update 09/15/2017: He still has some shooting pain  in the top of the feet into the toes.   He has dysesthetic pain with allodynia and hyperpathia, worse in the peroneal more than sural distribution.     He feels they probably occur less frequently on the medications.  He is currently 3/4 of a trileptal bid.    He stopped lamotrigine as oxcarbazepine works better.     He occasionally uses lidocaine.   Hot water seems to help for a while.     He has had some operations in his feet to see if the nerves being clipped would help but it has not.     Update 03/17/2017:   He noted some improvement with lamotrigine initially but a higher dose did not help any more.     The pain is in the feet, worse in the toes and milder at the ankles.    There is allodynia as well as tingling pain.   He feels mildly weak in his feet,    Lyrica was not tolerated.   He has not tried Trileptal.     From 08/08/2016:   He is much better on lamotrigine and is on 50 mg po bid.   He feels he is 60% better.  He still has some pain on the dorsum between the first and second toes but this is much better. He now does not note allodynia with  light touch. He has just slight pain with pressure between the first and second, second and third toes.   Pain had been worse old and slightly better with heat but he notes less of the fluctuations now.. Pain is better when he has his feet up.  He feels the current intensity of pain is tolerable.  History:   He broke his left ankle in 2012 or 2013 requiring plate and screws.   He later had the hardware removed without benefit.    A year leater, he had surgery to decompress the peroneal nerve.    He was referred to [facility] and had the left peroneal neurectomy and removal of  L3L4 Morton's Neuroma and L2L3 intermetatarsal nerve decompression procedures.    He was tried on Lyrica but felt very off balanced and mentally slowed so he stopped.  He had mild benefit from lidocaine containing ointments. A couple different types of ointments were tried  and the composition of all of them is unknown.     A nerve conduction study performed 05/09/2016 at [facility] showed an absent left superficial peroneal sensory response (was present on the right). Sural sensory response was normal. Left peroneal motor response was normal.  I have reviewed the MRI of the cervical spine dated 01/10/2009. 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 There is multilevel foraminal narrowing, worse to the right at C5-C6 and C6-C7 where there is possible nerve root compression.   Subsequently, a few months later,  he had cervical fusion.    REVIEW OF SYSTEMS: Constitutional: No fevers, chills, sweats, or change in appetite Eyes: No visual changes, double vision, eye pain Ear, nose and throat: No hearing loss, ear pain, nasal congestion, sore throat Cardiovascular: No chest pain, palpitations Respiratory: No shortness of breath at rest or with exertion.   No wheezes GastrointestinaI: No nausea, vomiting, diarrhea, abdominal pain, fecal incontinence.   Has GERD Genitourinary: No dysuria, urinary retention or frequency.  No nocturia. Musculoskeletal: No current neck pain, back pain Integumentary: No rash, pruritus, skin lesions Neurological: as above Psychiatric: No depression at this time.  No anxiety Endocrine: No palpitations, diaphoresis, change in appetite, change in weigh or increased thirst Hematologic/Lymphatic: No anemia, purpura, petechiae. Allergic/Immunologic: No itchy/runny eyes, nasal congestion, recent allergic reactions, rashes  ALLERGIES: Allergies  Allergen Reactions  . Codeine Hives and Itching  . Morphine Hives and Itching  . Other Other (See Comments)    Shrimp - rash Darvocet - itching    HOME MEDICATIONS:  Current Outpatient Medications:  .  calcium carbonate 1250 MG capsule, Take 1,250 mg by mouth 2 (two) times  daily with a meal., Disp: , Rfl:  .  Cholecalciferol (VITAMIN D-1000 MAX ST) 1000 units tablet, Take by mouth., Disp: , Rfl:  .  cyanocobalamin 1000 MCG tablet, Take 1,000 mcg by mouth daily., Disp: , Rfl:  .  lidocaine (XYLOCAINE) 5 % ointment, Apply 1 application topically as needed. Apply to left foot twice a day, Disp: 50 g, Rfl: 5 .  Multiple Vitamins-Minerals (CENTRUM SILVER PO), Take by mouth., Disp: , Rfl:  .  pantoprazole (PROTONIX) 40 MG tablet, TK 1 T PO QD, Disp: , Rfl: 2 .  pravastatin (PRAVACHOL) 40 MG tablet, TK 1 T PO QD, Disp: , Rfl: 2 .  telmisartan (MICARDIS) 80 MG tablet, , Disp: , Rfl:  .  nortriptyline (PAMELOR) 10 MG capsule,  Take one pill po qHS x 1 week then increase to 2 pills po qHS if tolerated, Disp: 60 capsule, Rfl: 11  PAST MEDICAL HISTORY: History reviewed. No pertinent past medical history.  PAST SURGICAL HISTORY: See above  FAMILY HISTORY: History reviewed. No pertinent family history.  SOCIAL HISTORY:  Social History   Socioeconomic History  . Marital status: Married    Spouse name: Not on file  . Number of children: Not on file  . Years of education: Not on file  . Highest education level: Not on file  Occupational History  . Not on file  Social Needs  . Financial resource strain: Not on file  . Food insecurity:    Worry: Not on file    Inability: Not on file  . Transportation needs:    Medical: Not on file    Non-medical: Not on file  Tobacco Use  . Smoking status: Former Research scientist (life sciences)  . Smokeless tobacco: Never Used  Substance and Sexual Activity  . Alcohol use: Not Currently    Alcohol/week: 0.0 oz  . Drug use: Not Currently  . Sexual activity: Not on file  Lifestyle  . Physical activity:    Days per week: Not on file    Minutes per session: Not on file  . Stress: Not on file  Relationships  . Social connections:    Talks on phone: Not on file    Gets together: Not on file    Attends religious service: Not on file    Active  member of club or organization: Not on file    Attends meetings of clubs or organizations: Not on file    Relationship status: Not on file  . Intimate partner violence:    Fear of current or ex partner: Not on file    Emotionally abused: Not on file    Physically abused: Not on file    Forced sexual activity: Not on file  Other Topics Concern  . Not on file  Social History Narrative  . Not on file     PHYSICAL EXAM  Vitals:   12/12/17 0939  BP: 125/71  Pulse: 68  Resp: 18  Weight: 179 lb (81.2 kg)  Height: 5' 10.5" (1.791 m)    Body mass index is 25.32 kg/m.   General: The patient is well-developed and well-nourished and in no acute distress    Skin/Ext: Extremities are without rash or edema.   Surgical scars well healed on left foot  Neurologic Exam  Mental status: The patient is alert and oriented x 3 at the time of the examination. The patient has apparent normal recent and remote memory (was 3/3/ at 3 minutes),    ([id number]).   Clock hands mildly misplaced.  Pattern continuation with several errors.  Has no trouble following three-step commands.   No aphasia  Cranial nerves: Extraocular movements are full.  Facial strength is normal.  Palatal elevation and tongue protrusion are midline.   No obvious hearing deficits are noted.  Motor:  Muscle bulk is normal.   Tone is normal. Strength is  5 / 5 in all 4 extremities.   Sensory:   There is reduced sensation with allodynia in the distribution of the superficial peroneal nerve of the left foot sensation and numbness without allodynia in the sural distribution.  He also had mildly reduced vibration sensation at the left great toe.    Sensation in the right foot is fairly normal  Coordination: Cerebellar testing reveals good finger-nose-finger good  heel-to-shin bilaterally.  Gait and station: Station is normal.   Gait is mildly wide based favoring left tender foot.  Romberg is negative.   Reflexes: Deep tendon  reflexes are symmetric and 3 at knees and 1 at ankles.       DIAGNOSTIC DATA (LABS, IMAGING, TESTING) - I reviewed patient records, labs, notes, testing and imaging myself where available.  Lab Results  Component Value Date   WBC 7.2 04/24/2009   HGB 16.3 04/24/2009   HCT 46.2 04/24/2009   MCV 96.7 04/24/2009   PLT 308 04/24/2009      Component Value Date/Time   NA 141 04/24/2009 1036   K 4.3 04/24/2009 1036   CL 104 04/24/2009 1036   CO2 28 04/24/2009 1036   GLUCOSE 101 (H) 04/24/2009 1036   BUN 16 04/24/2009 1036   CREATININE 0.78 04/24/2009 1036   CALCIUM 9.9 04/24/2009 1036   PROT 7.0 04/24/2009 1036   ALBUMIN 4.4 04/24/2009 1036   AST 21 04/24/2009 1036   ALT 23 04/24/2009 1036   ALKPHOS 30 (L) 04/24/2009 1036   BILITOT 0.7 04/24/2009 1036   GFRNONAA >60 04/24/2009 1036   GFRAA  04/24/2009 1036    >60        The eGFR has been calculated using the MDRD equation. This calculation has not been validated in all clinical situations. eGFR's persistently <60 mL/min signify possible Chronic Kidney Disease.       ASSESSMENT AND PLAN  Foot pain, left  Dysesthesia  Superficial peroneal nerve neuropathy, left  Mild cognitive impairment   1.    He has had trouble tolerating a lot of different medications for his neuropathic pain.  At various times, he thought the lamotrigine and the oxcarbazepine were helping him but then he felt that they did not benefit and he could not tolerate higher dose.  He will continue the lidocaine ointment as needed and I will add nortriptyline 10 mg nightly to titrate to 20 mg nightly if tolerated.   2.   Stay active and exercise as tolerated.   3.   He appears to have mild cognitive impairment.  Of note, he did well with short-term recall getting 3/3 words at 3 minutes.  However, visual-spatial and executive function tasks were reduced.  We will keep an eye on this.  Because the memory seems to be less effective than other aspects,  this could be a vascular mild cognitive impairment rather than amnestic (early AD).  4.    He will return to see me in 6 months or sooner if there are new or worsening neurologic symptoms.Marland Kitchen He should call if he has worsening pain or other new symptoms.   Halona Amstutz A. Felecia Shelling, MD, PhD 11/18/7791, 90:30 AM Certified in Neurology, Clinical Neurophysiology, Sleep Medicine, Pain Medicine and Neuroimaging  [facility] [address], [place], [place] [address] [phone]

## 2018-01-21 DIAGNOSIS — M859 Disorder of bone density and structure, unspecified: Secondary | ICD-10-CM | POA: Diagnosis not present

## 2018-01-21 DIAGNOSIS — R82998 Other abnormal findings in urine: Secondary | ICD-10-CM | POA: Diagnosis not present

## 2018-01-21 DIAGNOSIS — Z125 Encounter for screening for malignant neoplasm of prostate: Secondary | ICD-10-CM | POA: Diagnosis not present

## 2018-01-21 DIAGNOSIS — E7800 Pure hypercholesterolemia, unspecified: Secondary | ICD-10-CM | POA: Diagnosis not present

## 2018-01-21 DIAGNOSIS — I10 Essential (primary) hypertension: Secondary | ICD-10-CM | POA: Diagnosis not present

## 2018-01-27 DIAGNOSIS — D485 Neoplasm of uncertain behavior of skin: Secondary | ICD-10-CM | POA: Diagnosis not present

## 2018-01-27 DIAGNOSIS — L739 Follicular disorder, unspecified: Secondary | ICD-10-CM | POA: Diagnosis not present

## 2018-01-27 DIAGNOSIS — L570 Actinic keratosis: Secondary | ICD-10-CM | POA: Diagnosis not present

## 2018-01-28 DIAGNOSIS — G3184 Mild cognitive impairment, so stated: Secondary | ICD-10-CM | POA: Diagnosis not present

## 2018-01-28 DIAGNOSIS — Z0000 Encounter for general adult medical examination without abnormal findings: Secondary | ICD-10-CM | POA: Diagnosis not present

## 2018-01-28 DIAGNOSIS — N182 Chronic kidney disease, stage 2 (mild): Secondary | ICD-10-CM | POA: Diagnosis not present

## 2018-01-28 DIAGNOSIS — Z6827 Body mass index (BMI) 27.0-27.9, adult: Secondary | ICD-10-CM | POA: Diagnosis not present

## 2018-01-28 DIAGNOSIS — Z1389 Encounter for screening for other disorder: Secondary | ICD-10-CM | POA: Diagnosis not present

## 2018-01-28 DIAGNOSIS — Z23 Encounter for immunization: Secondary | ICD-10-CM | POA: Diagnosis not present

## 2018-01-28 DIAGNOSIS — I129 Hypertensive chronic kidney disease with stage 1 through stage 4 chronic kidney disease, or unspecified chronic kidney disease: Secondary | ICD-10-CM | POA: Diagnosis not present

## 2018-01-28 DIAGNOSIS — E7800 Pure hypercholesterolemia, unspecified: Secondary | ICD-10-CM | POA: Diagnosis not present

## 2018-01-28 DIAGNOSIS — R208 Other disturbances of skin sensation: Secondary | ICD-10-CM | POA: Diagnosis not present

## 2018-01-28 DIAGNOSIS — G9050 Complex regional pain syndrome I, unspecified: Secondary | ICD-10-CM | POA: Diagnosis not present

## 2018-01-28 DIAGNOSIS — R808 Other proteinuria: Secondary | ICD-10-CM | POA: Diagnosis not present

## 2018-01-28 DIAGNOSIS — I10 Essential (primary) hypertension: Secondary | ICD-10-CM | POA: Diagnosis not present

## 2018-01-28 DIAGNOSIS — G5732 Lesion of lateral popliteal nerve, left lower limb: Secondary | ICD-10-CM | POA: Diagnosis not present

## 2018-01-30 DIAGNOSIS — Z1212 Encounter for screening for malignant neoplasm of rectum: Secondary | ICD-10-CM | POA: Diagnosis not present

## 2018-02-16 ENCOUNTER — Telehealth: Payer: Self-pay | Admitting: Neurology

## 2018-02-16 NOTE — Telephone Encounter (Signed)
Spoke with Derrick White--she sts. pt. still has great difficulty walking distances; his temporary handicap parking placard is expiring in less than 2 weeks and he wondered if he could get a permanent placard. This is ok with RAS; he agrees that pt. is not likely to improve.  Parking placard up front [employer] and wife or pt. will pick it up/fim

## 2018-02-16 NOTE — Telephone Encounter (Signed)
Patient's wife [name] (on [place]) calling requesting a permanent handicap placard for the patient. He has a temporary one but needs a permanent one.

## 2018-02-18 ENCOUNTER — Telehealth: Payer: Self-pay | Admitting: Neurology

## 2018-02-18 MED ORDER — NORTRIPTYLINE HCL 10 MG PO CAPS: ORAL_CAPSULE | ORAL | 0 refills | Status: DC

## 2018-02-18 NOTE — Telephone Encounter (Signed)
Patient calling to discuss nortriptyline (PAMELOR) 10 MG capsule. He says medication is not helping with nerve discomfort. Can he try another medication?  He uses Paediatric nurse on [address]. Please call.

## 2018-02-18 NOTE — Telephone Encounter (Signed)
Spoke with [name] who sts. Derrick White tolerates Nortrip 10mg  well, it is just not providing enough pain relief .  Per RAS last note, he can increase to 20mg  qhs.  She verbalized understanding of same, will have pt. try 20mg  qhs. New rx. escribed to [facility]

## 2018-02-18 NOTE — Addendum Note (Signed)
Addended by: France Ravens I on: 02/18/2018 04:44 PM   Modules accepted: Orders

## 2018-06-03 ENCOUNTER — Telehealth: Payer: Self-pay | Admitting: Neurology

## 2018-06-03 MED ORDER — NORTRIPTYLINE HCL 10 MG PO CAPS: ORAL_CAPSULE | ORAL | 1 refills | Status: DC

## 2018-06-03 NOTE — Telephone Encounter (Signed)
E[facility].

## 2018-06-03 NOTE — Telephone Encounter (Signed)
Pt's wife called in to inform us that the pt now has [facility] and that his medication nortriptyline (PAMELOR) 10 MG capsule needs to be called in to them. She gave the num of [phone].

## 2018-06-15 ENCOUNTER — Encounter: Payer: Self-pay | Admitting: Neurology

## 2018-06-15 ENCOUNTER — Ambulatory Visit (INDEPENDENT_AMBULATORY_CARE_PROVIDER_SITE_OTHER): Payer: Medicare Other | Admitting: Neurology

## 2018-06-15 VITALS — BP 123/71 | HR 65 | Ht 70.5 in | Wt 184.5 lb

## 2018-06-15 DIAGNOSIS — G3184 Mild cognitive impairment, so stated: Secondary | ICD-10-CM | POA: Diagnosis not present

## 2018-06-15 DIAGNOSIS — R208 Other disturbances of skin sensation: Secondary | ICD-10-CM | POA: Diagnosis not present

## 2018-06-15 DIAGNOSIS — G5732 Lesion of lateral popliteal nerve, left lower limb: Secondary | ICD-10-CM | POA: Diagnosis not present

## 2018-06-15 DIAGNOSIS — M79672 Pain in left foot: Secondary | ICD-10-CM

## 2018-06-15 NOTE — Progress Notes (Signed)
[facility]  PATIENT: Derrick White DOB: 1940/11/28  REFERRING DOCTOR OR PCP:  Domenick Gong SOURCE: patient, records in EPIC from [place] and Neu[facility], MRI results, EMG/NCV results  _________________________________   HISTORICAL  CHIEF COMPLAINT:  Chief Complaint  Patient presents with  . Follow-up    RM 13 with wife. Last seen 12/12/17. Pt reports no new sx but has not noticed any improvement since last seen. He would like to discuss going off some medications to see if this will help.    HISTORY OF PRESENT ILLNESS:  Derrick White is a 78 yo man with left ankle and foot pain.    Update 06/15/18: He feels the foot is doing about the same as last visit.     Numbness is in the left foot up to above the ankles.   He has constant discomfort with some superimposed sharp pains and allodynia.    He takes nortriptyline 20 mg nightly.   He slurs his speech some.   Oxcarbazepine and lamotrigine had both helped initially but then seemed not to. Help as much.    Lidocaine ointment did not help.   He notes more trouble with slurred speech.  He had reported memory issues last visit.   He has trouble with names.   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    Update 12/12/2017: He continues to have numbness and allodynia in the left foot.  At the last visit the dose of Trileptal was increased to 300 mg po bid but it made him dizzy and his voice slurred and he went back down to 1/2 pill bid.  The dizziness is much better but his voice is still slurred.    At the last visit, he said oxcarbazepine was helping more than the lamotrigine so he stopped the lamotrigine.  The previous visit, he said the lamotrigine was helping about 60%, however.   Lyrica was not tolerated.      He also uses lidocaine ointment without much benefit --- helps a bit but quickly wears off.  He has had many podiatric procedures on the left  He notes the slurred speech fluctuates  and he has 5-10 minutes of severe slurred speech once a day, but better the last couple weeks.,  He had an MRI June 2019.   I personally reviewed the MRI of the brain.  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  There were no acute findings.  I asked about his short-term memory.  He feels his short-term memory is doing about the same but he notes that his wife sometimes tells him that he never remembers things.    Mood is doing well.  He feels he sleeps well at night.    Update 09/15/2017: He still has some shooting pain in the top of the feet into the toes.   He has dysesthetic pain with allodynia and hyperpathia, worse in the peroneal more than sural distribution.     He feels they probably occur less frequently on the medications.  He is currently 3/4 of a trileptal bid.    He stopped lamotrigine as oxcarbazepine works better.     He occasionally uses lidocaine.   Hot water seems to help for a while.     He has had some operations in his feet to see if the nerves being clipped would help but it has not.  Update 03/17/2017:   He noted some improvement with lamotrigine initially but a higher dose did not help any more.     The pain is in the feet, worse in the toes and milder at the ankles.    There is allodynia as well as tingling pain.   He feels mildly weak in his feet,    Lyrica was not tolerated.   He has not tried Trileptal.     From 08/08/2016:   He is much better on lamotrigine and is on 50 mg po bid.   He feels he is 60% better.  He still has some pain on the dorsum between the first and second toes but this is much better. He now does not note allodynia with light touch. He has just slight pain with pressure between the first and second, second and third toes.   Pain had been worse old and slightly better with heat but he notes less of the fluctuations now.. Pain is  better when he has his feet up.  He feels the current intensity of pain is tolerable.  History:   He broke his left ankle in 2012 or 2013 requiring plate and screws.   He later had the hardware removed without benefit.    A year leater, he had surgery to decompress the peroneal nerve.    He was referred to [facility] and had the left peroneal neurectomy and removal of  L3L4 Morton's Neuroma and L2L3 intermetatarsal nerve decompression procedures.    He was tried on Lyrica but felt very off balanced and mentally slowed so he stopped.  He had mild benefit from lidocaine containing ointments. A couple different types of ointments were tried and the composition of all of them is unknown.     A nerve conduction study performed 05/09/2016 at [facility] showed an absent left superficial peroneal sensory response (was present on the right). Sural sensory response was normal. Left peroneal motor response was normal.  I have reviewed the MRI of the cervical spine dated 01/10/2009. 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 There is multilevel foraminal narrowing, worse to the right at C5-C6 and C6-C7 where there is possible nerve root compression.   Subsequently, a few months later,  he had cervical fusion.    REVIEW OF SYSTEMS: Constitutional: No fevers, chills, sweats, or change in appetite Eyes: No visual changes, double vision, eye pain Ear, nose and throat: No hearing loss, ear pain, nasal congestion, sore throat Cardiovascular: No chest pain, palpitations Respiratory: No shortness of breath at rest or with exertion.   No wheezes GastrointestinaI: No nausea, vomiting, diarrhea, abdominal pain, fecal incontinence.   Has GERD Genitourinary: No dysuria, urinary retention or frequency.  No nocturia. Musculoskeletal: No current neck pain, back pain Integumentary: No rash,  pruritus, skin lesions Neurological: as above Psychiatric: No depression at this time.  No anxiety Endocrine: No palpitations, diaphoresis, change in appetite, change in weigh or increased thirst Hematologic/Lymphatic: No anemia, purpura, petechiae. Allergic/Immunologic: No itchy/runny eyes, nasal congestion, recent allergic reactions, rashes  ALLERGIES: Allergies  Allergen Reactions  . Codeine Hives and Itching  . Morphine Hives and Itching  . Other Other (See Comments)    Shrimp - rash Darvocet - itching    HOME MEDICATIONS:  Current Outpatient Medications:  .  calcium carbonate 1250 MG capsule, Take 1,250 mg by mouth 2 (two) times daily with  a meal., Disp: , Rfl:  .  Cholecalciferol (VITAMIN D-1000 MAX ST) 1000 units tablet, Take by mouth., Disp: , Rfl:  .  cyanocobalamin 1000 MCG tablet, Take 1,000 mcg by mouth daily., Disp: , Rfl:  .  lidocaine (XYLOCAINE) 5 % ointment, Apply 1 application topically as needed. Apply to left foot twice a day, Disp: 50 g, Rfl: 5 .  Multiple Vitamins-Minerals (CENTRUM SILVER PO), Take by mouth., Disp: , Rfl:  .  nortriptyline (PAMELOR) 10 MG capsule, Take 2 tablets at bedtime, Disp: 180 capsule, Rfl: 1 .  pantoprazole (PROTONIX) 40 MG tablet, TK 1 T PO QD, Disp: , Rfl: 2 .  pravastatin (PRAVACHOL) 40 MG tablet, TK 1 T PO QD, Disp: , Rfl: 2 .  telmisartan (MICARDIS) 80 MG tablet, , Disp: , Rfl:   PAST MEDICAL HISTORY: History reviewed. No pertinent past medical history.  PAST SURGICAL HISTORY: See above  FAMILY HISTORY: History reviewed. No pertinent family history.  SOCIAL HISTORY:  Social History   Socioeconomic History  . Marital status: Married    Spouse name: Not on file  . Number of children: Not on file  . Years of education: Not on file  . Highest education level: Not on file  Occupational History  . Not on file  Social Needs  . Financial resource strain: Not on file  . Food insecurity:    Worry: Not on file     Inability: Not on file  . Transportation needs:    Medical: Not on file    Non-medical: Not on file  Tobacco Use  . Smoking status: Former Research scientist (life sciences)  . Smokeless tobacco: Never Used  Substance and Sexual Activity  . Alcohol use: Not Currently    Alcohol/week: 0.0 standard drinks  . Drug use: Not Currently  . Sexual activity: Not on file  Lifestyle  . Physical activity:    Days per week: Not on file    Minutes per session: Not on file  . Stress: Not on file  Relationships  . Social connections:    Talks on phone: Not on file    Gets together: Not on file    Attends religious service: Not on file    Active member of club or organization: Not on file    Attends meetings of clubs or organizations: Not on file    Relationship status: Not on file  . Intimate partner violence:    Fear of current or ex partner: Not on file    Emotionally abused: Not on file    Physically abused: Not on file    Forced sexual activity: Not on file  Other Topics Concern  . Not on file  Social History Narrative  . Not on file     PHYSICAL EXAM  Vitals:   06/15/18 0957  BP: 123/71  Pulse: 65  Weight: 184 lb 8 oz (83.7 kg)  Height: 5' 10.5" (1.791 m)    Body mass index is 26.1 kg/m.   General: The patient is well-developed and well-nourished and in no acute distress    Skin/Ext: Extremities are without rash or edema.   Surgical scars well healed on left foot and do not look over the superficial peroneal nerve (closer to sural)  Neurologic Exam  Mental status: The patient is alert and oriented x 3 at the time of the examination. The patient has apparent normal recent and remote memory (was 3/3 at 3 minutes),    ([id number]).   Clock numbers slightly misplaced with hands  well positioned.  Pattern continuation with no errors.  Has no trouble following commands.   No aphasia  Cranial nerves: Extraocular movements are full.  Facial strength is normal.  Palatal elevation and tongue  protrusion are midline.   No obvious hearing deficits are noted.  Motor:  Muscle bulk is normal.   Tone is normal. Strength is  5/5 in legs/feet including EHL   Sensory:   There is reduced sensation with allodynia in the distribution of the superficial peroneal nerve of the left foot sensation and is more normal in the sural distribution.  He also had mildly reduced vibration sensation at the left great toe.    Sensation in the right foot is fairly normal  Coordination: Cerebellar testing reveals good finger-nose-finger good heel-to-shin bilaterally.  Gait and station: Station is normal.   Gait is mildly wide based favoring left tender foot.  Romberg is negative.   Reflexes: Deep tendon reflexes are symmetric and 3 at knees and 1 at ankles.       DIAGNOSTIC DATA (LABS, IMAGING, TESTING) - I reviewed patient records, labs, notes, testing and imaging myself where available.  Lab Results  Component Value Date   WBC 7.2 04/24/2009   HGB 16.3 04/24/2009   HCT 46.2 04/24/2009   MCV 96.7 04/24/2009   PLT 308 04/24/2009      Component Value Date/Time   NA 141 04/24/2009 1036   K 4.3 04/24/2009 1036   CL 104 04/24/2009 1036   CO2 28 04/24/2009 1036   GLUCOSE 101 (H) 04/24/2009 1036   BUN 16 04/24/2009 1036   CREATININE 0.78 04/24/2009 1036   CALCIUM 9.9 04/24/2009 1036   PROT 7.0 04/24/2009 1036   ALBUMIN 4.4 04/24/2009 1036   AST 21 04/24/2009 1036   ALT 23 04/24/2009 1036   ALKPHOS 30 (L) 04/24/2009 1036   BILITOT 0.7 04/24/2009 1036   GFRNONAA >60 04/24/2009 1036   GFRAA  04/24/2009 1036    >60        The eGFR has been calculated using the MDRD equation. This calculation has not been validated in all clinical situations. eGFR's persistently <60 mL/min signify possible Chronic Kidney Disease.       ASSESSMENT AND PLAN  Dysesthesia  Foot pain, left  Superficial peroneal nerve neuropathy, left  Mild cognitive impairment   1.    He will try to taper off the  nortriptyline as he is not sure how much it helps.  He has had trouble tolerating a lot of different medications for his neuropathic pain.     2.   Stay active and exercise as tolerated.   3.   Cognition appears stable  4.    He will return to see me in 6 months or sooner if there are new or worsening neurologic symptoms.Marland Kitchen He should call if he has worsening pain or other new symptoms.   Terianna Peggs A. Felecia Shelling, MD, PhD 0/93/8182, 9:93 PM Certified in Neurology, Clinical Neurophysiology, Sleep Medicine, Pain Medicine and Neuroimaging  [facility] [address] [place], [place] [address] [phone]

## 2018-08-04 DIAGNOSIS — M859 Disorder of bone density and structure, unspecified: Secondary | ICD-10-CM | POA: Diagnosis not present

## 2018-08-04 DIAGNOSIS — R808 Other proteinuria: Secondary | ICD-10-CM | POA: Diagnosis not present

## 2018-08-04 DIAGNOSIS — M5136 Other intervertebral disc degeneration, lumbar region: Secondary | ICD-10-CM | POA: Diagnosis not present

## 2018-08-04 DIAGNOSIS — G3184 Mild cognitive impairment, so stated: Secondary | ICD-10-CM | POA: Diagnosis not present

## 2018-08-04 DIAGNOSIS — N182 Chronic kidney disease, stage 2 (mild): Secondary | ICD-10-CM | POA: Diagnosis not present

## 2018-08-04 DIAGNOSIS — G4709 Other insomnia: Secondary | ICD-10-CM | POA: Diagnosis not present

## 2018-08-04 DIAGNOSIS — M1990 Unspecified osteoarthritis, unspecified site: Secondary | ICD-10-CM | POA: Diagnosis not present

## 2018-08-04 DIAGNOSIS — N401 Enlarged prostate with lower urinary tract symptoms: Secondary | ICD-10-CM | POA: Diagnosis not present

## 2018-08-04 DIAGNOSIS — K219 Gastro-esophageal reflux disease without esophagitis: Secondary | ICD-10-CM | POA: Diagnosis not present

## 2018-08-04 DIAGNOSIS — E7800 Pure hypercholesterolemia, unspecified: Secondary | ICD-10-CM | POA: Diagnosis not present

## 2018-08-04 DIAGNOSIS — I129 Hypertensive chronic kidney disease with stage 1 through stage 4 chronic kidney disease, or unspecified chronic kidney disease: Secondary | ICD-10-CM | POA: Diagnosis not present

## 2018-08-05 DIAGNOSIS — H2513 Age-related nuclear cataract, bilateral: Secondary | ICD-10-CM | POA: Diagnosis not present

## 2018-08-05 DIAGNOSIS — H52203 Unspecified astigmatism, bilateral: Secondary | ICD-10-CM | POA: Diagnosis not present

## 2018-08-05 DIAGNOSIS — H25011 Cortical age-related cataract, right eye: Secondary | ICD-10-CM | POA: Diagnosis not present

## 2018-08-05 DIAGNOSIS — H5203 Hypermetropia, bilateral: Secondary | ICD-10-CM | POA: Diagnosis not present

## 2018-08-06 ENCOUNTER — Ambulatory Visit (INDEPENDENT_AMBULATORY_CARE_PROVIDER_SITE_OTHER): Payer: Medicare Other | Admitting: Orthopaedic Surgery

## 2018-08-06 ENCOUNTER — Encounter (INDEPENDENT_AMBULATORY_CARE_PROVIDER_SITE_OTHER): Payer: Self-pay | Admitting: Orthopaedic Surgery

## 2018-08-06 ENCOUNTER — Ambulatory Visit (INDEPENDENT_AMBULATORY_CARE_PROVIDER_SITE_OTHER): Payer: Medicare Other

## 2018-08-06 ENCOUNTER — Other Ambulatory Visit: Payer: Self-pay

## 2018-08-06 VITALS — BP 145/66 | HR 66 | Ht 70.0 in | Wt 178.0 lb

## 2018-08-06 DIAGNOSIS — M25572 Pain in left ankle and joints of left foot: Secondary | ICD-10-CM

## 2018-08-06 HISTORY — DX: Pain in left ankle and joints of left foot: M25.572

## 2018-08-06 MED ORDER — METHYLPREDNISOLONE ACETATE 40 MG/ML IJ SUSP
40.0000 mg | INTRAMUSCULAR | Status: AC | PRN
  Administered 2018-08-06: 40 mg via INTRA_ARTICULAR

## 2018-08-06 MED ORDER — LIDOCAINE HCL 1 % IJ SOLN
1.0000 mL | INTRAMUSCULAR | Status: AC | PRN
  Administered 2018-08-06: 1 mL

## 2018-08-06 MED ORDER — BUPIVACAINE HCL 0.5 % IJ SOLN
1.0000 mL | INTRAMUSCULAR | Status: AC | PRN
  Administered 2018-08-06: 1 mL via INTRA_ARTICULAR

## 2018-08-06 NOTE — Progress Notes (Signed)
Office Visit Note   Patient: Derrick White           Date of Birth: 30-Oct-1940           MRN: [id number] Visit Date: 08/06/2018              Requested by: Haywood Pao, MD [address] [place], [place] [address] PCP: Haywood Pao, MD   Assessment & Plan: Visit Diagnoses:  1. 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  Long discussion regarding the diagnosis and treatment options.  I am going to inject the ankle joint as both a diagnostic and treatment modality.  Good response to the injection.  Will also suggest an ankle brace from [facility] and will given a prescription.  Follow-up as needed  Follow-Up Instructions: Return if symptoms worsen or fail to improve.   Orders:  Orders Placed This Encounter  Procedures  . Medium Joint Inj  . XR Ankle Complete Left   No orders of the defined types were placed in this encounter.     Procedures: Medium Joint Inj on 08/06/2018 11:45 AM Details: 27 G needle, anterolateral approach Medications: 1 mL lidocaine 1 %; 1 mL bupivacaine 0.5 %; 40 mg methylPREDNISolone acetate 40 MG/ML      Clinical Data: No additional findings.   Subjective: Chief Complaint  Patient presents with  . Left Ankle - Pain  Patient presents today for left ankle pain. He said that it has hurt for seven years. He fractured his ankle seven years ago and had to have surgical correction. He said that that it hurts constantly. He also feels like his ankle is weak. He does not take anything for pain except Advil occasionally. He has questions about nerve pain.  Derrick White has had a real problem with his left ankle since he sustained an ankle fracture several years ago.  He initially underwent open reduction internal fixation elsewhere.  He developed a neuroma about the peroneal nerve.  I saw him in the office and explored the wound found the neuroma in continuity.  This was  decompressed.  With persistent problems he eventually was seen at [facility].  He underwent excision of the nerve.  He is been having trouble with his ankle ever since that time.  He is not had any recurrent injury or trauma but feels unsteady.  Pain seems to be localized more about the ankle HPI  Review of Systems  Constitutional: Negative for fatigue.  HENT: Negative for ear pain.   Eyes: Negative for pain.  Respiratory: Negative for shortness of breath.   Cardiovascular: Negative for leg swelling.  Gastrointestinal: Negative for constipation and diarrhea.  Endocrine: Positive for cold intolerance. Negative for heat intolerance.  Genitourinary: Negative for difficulty urinating.  Musculoskeletal: Negative for joint swelling.  Skin: Negative for rash.  Allergic/Immunologic: Negative for food allergies.  Neurological: Positive for weakness.  Hematological: Does not bruise/bleed easily.  Psychiatric/Behavioral: Negative for sleep disturbance.     Objective: Vital Signs: BP (!) 145/66   Pulse 66   Ht 5\' 10"  (1.778 m)   Wt 178 lb (80.7 kg)   BMI 25.54 kg/m   Physical Exam Constitutional:      Appearance: He is well-developed.  Eyes:     Pupils: Pupils are equal, round, and reactive to light.  Pulmonary:     Effort: Pulmonary effort is normal.  Skin:    General:  Skin is warm and dry.  Neurological:     Mental Status: He is alert and oriented to person, place, and time.  Psychiatric:        Behavior: Behavior normal.     Ortho Exam awake alert and oriented x3.  Comfortable sitting.  Incisions about the left ankle of healed.  He does have some pain along the anterior aspect of the ankle and about the medial lateral malleolar line.  He does have some decreased ankle flexion extension with some pain.  No pain behind either malleoli.  No swelling.  Skin intact  Specialty Comments:  No specialty comments available.  Imaging: Xr Ankle Complete Left  Result Date: 08/06/2018 Films  of the left ankle obtained in several projections.  There are some degenerative changes in the ankle joint consistent with traumatic arthritis.  There is some narrowing of the joint space more laterally than medially.  No ectopic calcification.  Evidence of an old ankle fracture.  There is a retained anchor holding the distal tib-fib joint.  No evidence of diastases no acute changes.    PMFS History: Patient Active Problem List   Diagnosis Date Noted  . Pain in left ankle and joints of left foot 08/06/2018  . 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  . Foot pain, 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  . Dysesthesia 06/04/2016  . 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 History of cervical spinal surgery 06/04/2016   History reviewed. No pertinent past medical history.  History reviewed. No pertinent family history.  History reviewed. No pertinent surgical history. Social History   Occupational History  . Not on file  Tobacco Use  . Smoking status: Former Research scientist (life sciences)  . Smokeless tobacco: Never Used  Substance and Sexual Activity  . Alcohol use: Not Currently    Alcohol/week: 0.0 standard drinks  . Drug use: Not Currently  . Sexual activity: Not on file

## 2018-08-07 ENCOUNTER — Ambulatory Visit (INDEPENDENT_AMBULATORY_CARE_PROVIDER_SITE_OTHER): Payer: Medicare Other | Admitting: Orthopaedic Surgery

## 2018-09-17 ENCOUNTER — Ambulatory Visit: Payer: Medicare Other | Admitting: Neurology

## 2018-11-17 ENCOUNTER — Other Ambulatory Visit: Payer: Self-pay

## 2018-11-17 ENCOUNTER — Ambulatory Visit (INDEPENDENT_AMBULATORY_CARE_PROVIDER_SITE_OTHER): Payer: Medicare Other

## 2018-11-17 ENCOUNTER — Encounter: Payer: Self-pay | Admitting: Orthopaedic Surgery

## 2018-11-17 ENCOUNTER — Ambulatory Visit (INDEPENDENT_AMBULATORY_CARE_PROVIDER_SITE_OTHER): Payer: Medicare Other | Admitting: Orthopaedic Surgery

## 2018-11-17 VITALS — BP 156/76 | HR 61 | Ht 70.0 in | Wt 175.0 lb

## 2018-11-17 DIAGNOSIS — M25511 Pain in right shoulder: Secondary | ICD-10-CM

## 2018-11-17 NOTE — Progress Notes (Signed)
Office Visit Note   Patient: Derrick White           Date of Birth: [birth year]           MRN: [id number] Visit Date: 11/17/2018              Requested by: Haywood Pao, MD [address] [place],  [place] [address] PCP: Haywood Pao, MD   Assessment & Plan: Visit Diagnoses:  1. Acute pain of right shoulder     Plan: Could have a small rotator cuff tear but has mild symptoms at this point.  Felt pain in his right shoulder day after working in the yard.  No injury or trauma.  X-rays were nondiagnostic.  Has good strength and no skin changes.  Will monitor his symptoms and reevaluate over the next 3 to 4 weeks if still having any trouble  Follow-Up Instructions: Return if symptoms worsen or fail to improve.   Orders:  Orders Placed This Encounter  Procedures  . XR Shoulder Right   No orders of the defined types were placed in this encounter.     Procedures: No procedures performed   Clinical Data: No additional findings.   Subjective: Chief Complaint  Patient presents with  . Right Shoulder - Pain  Patient presents today with right shoulder pain X10 days. No known injury. He said that he may have pulled something while doing yard work, but unsure. His pain is located on the anterior side.His range of motion is limited due to pain. He feels like his pain has gotten better. He takes Advil. No numbness, tingling, or weakness.   HPI  Review of Systems   Objective: Vital Signs: BP (!) 156/76   Pulse 61   Ht 5\' 10"  (1.778 m)   Wt 175 lb (79.4 kg)   BMI 25.11 kg/m   Physical Exam Constitutional:      Appearance: He is well-developed.  Eyes:     Pupils: Pupils are equal, round, and reactive to light.  Pulmonary:     Effort: Pulmonary effort is normal.  Skin:    General: Skin is warm and dry.  Neurological:     Mental Status: He is alert and oriented to person, place, and time.  Psychiatric:        Behavior: Behavior normal.     Ortho Exam  awake alert and oriented x3.  Comfortable sitting.  Lacks a few degrees to full overhead motion of the right shoulder but no significant pain.  Negative impingement.  Negative empty can testing.  Biceps intact.  Good strength.  No localized areas of tenderness.  Specialty Comments:  No specialty comments available.  Imaging: Xr Shoulder Right  Result Date: 11/17/2018 Films of the right shoulder obtained in several projections.  There were no acute changes the humeral head is centered about the glenoid.  Normal space between the humeral head and the acromion.  Mild lateral downsloping of the acromion mild degenerative changes the chromic clavicular joint.  No ectopic calcification    PMFS History: Patient Active Problem List   Diagnosis Date Noted  . Pain in left ankle and joints of left foot 08/06/2018  . 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  . Foot pain, 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  . Dysesthesia 06/04/2016  . 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 History of cervical spinal surgery 06/04/2016   History reviewed. No pertinent past medical history.  History reviewed. No pertinent family history.  History reviewed. No pertinent surgical history.  Social History   Occupational History  . Not on file  Tobacco Use  . Smoking status: Former Research scientist (life sciences)  . Smokeless tobacco: Never Used  Substance and Sexual Activity  . Alcohol use: Not Currently    Alcohol/week: 0.0 standard drinks  . Drug use: Not Currently  . Sexual activity: Not on file

## 2018-12-17 DIAGNOSIS — R1031 Right lower quadrant pain: Secondary | ICD-10-CM | POA: Diagnosis not present

## 2018-12-17 DIAGNOSIS — K4090 Unilateral inguinal hernia, without obstruction or gangrene, not specified as recurrent: Secondary | ICD-10-CM | POA: Diagnosis not present

## 2018-12-30 DIAGNOSIS — R221 Localized swelling, mass and lump, neck: Secondary | ICD-10-CM | POA: Diagnosis not present

## 2018-12-30 DIAGNOSIS — K4090 Unilateral inguinal hernia, without obstruction or gangrene, not specified as recurrent: Secondary | ICD-10-CM | POA: Diagnosis not present

## 2019-01-01 ENCOUNTER — Ambulatory Visit: Payer: Self-pay | Admitting: Surgery

## 2019-01-01 NOTE — H&P (Signed)
  Draysen E Shrestha DOB: 1940-10-10 Married / Language: English / Race: White Male   History of Present Illness  Patient words: This is a very pleasant and relatively healthy 78 year old man is referred by Dr. Osborne Casco for right inguinal hernia. He first noticed it 3 or 4 months ago when he leans up against the sink counter while shaving and felt something in the right groin. He does not know how long it was present for that. It does not cause him any pain. Denies any urinary symptoms. Reports his appetite is normal and his bowel movements have been regular. He has not had any prior abdominal surgery.  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  His wife recently has been having some medical issues and so he is helping her to heal from surgery.   Allergies Morphine Sulfate (Concentrate) *ANALGESICS - OPIOID*  Codeine Phosphate *ANALGESICS - OPIOID*  Allergies Reconciled   Medication History Calcium Carbonate (1250 (500 Ca)MG Capsule, Oral) Active. Vitamin D (Oral) Specific strength unknown - Active. Vitamin B12 (1000MCG Tablet ER, Oral) Active. LamoTRIgine (100MG  Tablet, Oral) Active. Centrum Silver Adult 50+ (Oral) Active. Pantoprazole Sodium (40MG  Tablet DR, Oral daily) Active. Pravastatin Sodium (40MG  Tablet, Oral daily) Active. Medications Reconciled  Vitals Weight: 180.5 lb Height: 70in Body Surface Area: 2 m Body Mass Index: 25.9 kg/m  Temp.: 98.55F (Oral)  Pulse: 76 (Regular)  BP: 126/72(Sitting, Left Arm, Standard)       Physical Exam The physical exam findings are as follows: Note: Gen: alert and well appearing Eye: extraocular motion intact, no scleral icterus ENT: moist mucus membranes, dentition intact Neck: no mass or thyromegaly Chest: unlabored respirations, symmetrical air entry, clear  bilaterally CV: regular rate and rhythm, no pedal edema Abdomen: soft, nontender, nondistended. No mass or organomegaly. Reducible right inguinal hernia. No hernia on the left. MSK: strength symmetrical throughout, no deformity Neuro: grossly intact, normal gait Psych: normal mood and affect, appropriate insight Skin: warm and dry, 3 cm lipoma in the left lateral posterior neck    Assessment & Plan  SUBCUTANEOUS MASS OF NECK (R22.1) Story: Lipoma. It sounds like he considered removing this before with Dr. Johney Maine did not proceed. This can be done at the time of his hernia surgery if desired, but he does not wish to have this removed at this time.    RIGHT INGUINAL HERNIA (K40.90) Story: Sizable but minimally symptomatic. Reducible. Discussed the anatomy of the inguinal region and the options of repair, for this unilateral nonrecurring hernia I would recommend an open approach. We discussed risks of bleeding, infection, pain, scarring, injury to intra-abdominal structures or retroperitoneal structures, can cause chronic pain, seroma/hematoma, and hernia recurrence. Questions were answered. Given everything going on with his wife he is not ready to schedule today but he will call when he is ready to do so.

## 2019-01-27 DIAGNOSIS — Z125 Encounter for screening for malignant neoplasm of prostate: Secondary | ICD-10-CM | POA: Diagnosis not present

## 2019-01-27 DIAGNOSIS — M859 Disorder of bone density and structure, unspecified: Secondary | ICD-10-CM | POA: Diagnosis not present

## 2019-01-27 DIAGNOSIS — E7800 Pure hypercholesterolemia, unspecified: Secondary | ICD-10-CM | POA: Diagnosis not present

## 2019-01-30 DIAGNOSIS — Z23 Encounter for immunization: Secondary | ICD-10-CM | POA: Diagnosis not present

## 2019-02-02 DIAGNOSIS — M5136 Other intervertebral disc degeneration, lumbar region: Secondary | ICD-10-CM | POA: Diagnosis not present

## 2019-02-02 DIAGNOSIS — D126 Benign neoplasm of colon, unspecified: Secondary | ICD-10-CM | POA: Diagnosis not present

## 2019-02-02 DIAGNOSIS — G3184 Mild cognitive impairment, so stated: Secondary | ICD-10-CM | POA: Diagnosis not present

## 2019-02-02 DIAGNOSIS — I129 Hypertensive chronic kidney disease with stage 1 through stage 4 chronic kidney disease, or unspecified chronic kidney disease: Secondary | ICD-10-CM | POA: Diagnosis not present

## 2019-02-02 DIAGNOSIS — E7800 Pure hypercholesterolemia, unspecified: Secondary | ICD-10-CM | POA: Diagnosis not present

## 2019-02-02 DIAGNOSIS — Z0000 Encounter for general adult medical examination without abnormal findings: Secondary | ICD-10-CM | POA: Diagnosis not present

## 2019-02-02 DIAGNOSIS — M1990 Unspecified osteoarthritis, unspecified site: Secondary | ICD-10-CM | POA: Diagnosis not present

## 2019-02-02 DIAGNOSIS — G9050 Complex regional pain syndrome I, unspecified: Secondary | ICD-10-CM | POA: Diagnosis not present

## 2019-02-02 DIAGNOSIS — R809 Proteinuria, unspecified: Secondary | ICD-10-CM | POA: Diagnosis not present

## 2019-02-02 DIAGNOSIS — M8580 Other specified disorders of bone density and structure, unspecified site: Secondary | ICD-10-CM | POA: Diagnosis not present

## 2019-02-02 DIAGNOSIS — N182 Chronic kidney disease, stage 2 (mild): Secondary | ICD-10-CM | POA: Diagnosis not present

## 2019-02-02 DIAGNOSIS — Z1339 Encounter for screening examination for other mental health and behavioral disorders: Secondary | ICD-10-CM | POA: Diagnosis not present

## 2019-02-02 DIAGNOSIS — N401 Enlarged prostate with lower urinary tract symptoms: Secondary | ICD-10-CM | POA: Diagnosis not present

## 2019-02-25 NOTE — Pre-Procedure Instructions (Signed)
[place], [place] [address] Phone: [phone] Fax: [phone]  [id number] - [place], [place] AT [address] [place] [address] Phone: [phone] Fax: [phone]    Your procedure is scheduled on Fri., Oct. 16, 2020 from 10:00AM-12:00PM  Report to [facility] Entrance "A" at 8:00AM  Call this number if you have problems the morning of surgery:  [phone]   Remember:  Do not eat or drink after midnight on Oct. 15th    Take these medicines the morning of surgery with A SIP OF WATER: Pantoprazole (National)  If Needed:  Acetaminophen (TYLENOL) Propylene Glycol (SYSTANE BALANCE)  As of today, stop taking all Aspirin (unless instructed by your doctor) and Other Aspirin containing products, Vitamins, Fish oils, and Herbal medications. Also stop all NSAIDS i.e. Advil, Ibuprofen, Motrin, Aleve, Anaprox, Naproxen, BC, Goody Powders, and all Supplements.  No Tobacco or Alcohol products for 24 hours prior to your procedure.    Special instructions:   [facility]- Preparing For Surgery  Before surgery, you can play an important role. Because skin is not sterile, your skin needs to be as free of germs as possible. You can reduce the number of germs on your skin by washing with CHG (chlorahexidine gluconate) Soap before surgery.  CHG is an antiseptic cleaner which kills germs and bonds with the skin to continue killing germs even after washing.    Please do not use if you have an allergy to CHG or antibacterial soaps. If your skin becomes reddened/irritated stop using the CHG.  Do not shave (including legs and underarms) for at least 48 hours prior to first CHG shower. It is OK to shave your face.  Please follow these instructions carefully.   1. Shower the NIGHT BEFORE SURGERY and the MORNING OF SURGERY with CHG.    2. If you chose to wash your hair, wash your hair first as usual with your normal shampoo.  3. After you shampoo, rinse your hair and body thoroughly to remove the shampoo.  4. Use CHG as you would any other liquid soap. You can apply CHG directly to the skin and wash gently with a scrungie or a clean washcloth.   5. Apply the CHG Soap to your body ONLY FROM THE NECK DOWN.  Do not use on open wounds or open sores. Avoid contact with your eyes, ears, mouth and genitals (private parts). Wash Face and genitals (private parts)  with your normal soap.  6. Wash thoroughly, paying special attention to the area where your surgery will be performed.  7. Thoroughly rinse your body with warm water from the neck down.  8. DO NOT shower/wash with your normal soap after using and rinsing off the CHG Soap.  9. Pat yourself dry with a CLEAN TOWEL.  10. Wear CLEAN PAJAMAS to bed the night before surgery, wear comfortable clothes the morning of surgery  11. Place CLEAN SHEETS on your bed the night of your first shower and DO NOT SLEEP WITH PETS.   Day of Surgery:             Remember to brush your teeth WITH YOUR REGULAR TOOTHPASTE.  Do not wear jewelry.  Do not wear lotions, powders, colognes, or deodorant.  Do not shave 48 hours prior to surgery.  Men may shave face and neck.  Do not bring valuables to  the hospital.  [facility] is not responsible for any belongings or valuables.  Contacts, dentures or bridgework may not be worn into surgery.    For patients admitted to the hospital, discharge time will be determined by your treatment team.  Patients discharged the day of surgery will not be allowed to drive home, and someone age 78 and over needs to stay with them for 24 hours.  Please wear clean clothes to the hospital/surgery center.     Please read over the following fact sheets that you were given. Pain Booklet, Coughing and Deep Breathing and Surgical Site Infection  Prevention

## 2019-02-26 ENCOUNTER — Encounter (HOSPITAL_COMMUNITY): Payer: Self-pay

## 2019-02-26 ENCOUNTER — Encounter (HOSPITAL_COMMUNITY)
Admission: RE | Admit: 2019-02-26 | Discharge: 2019-02-26 | Disposition: A | Discharge status: Home / Self Care | Payer: Medicare Other | Source: Ambulatory Visit | Attending: Orthopaedic Surgery | Admitting: Orthopaedic Surgery

## 2019-02-26 ENCOUNTER — Other Ambulatory Visit: Payer: Self-pay

## 2019-02-26 DIAGNOSIS — Z01818 Encounter for other preprocedural examination: Secondary | ICD-10-CM | POA: Diagnosis not present

## 2019-02-26 DIAGNOSIS — K4090 Unilateral inguinal hernia, without obstruction or gangrene, not specified as recurrent: Secondary | ICD-10-CM | POA: Insufficient documentation

## 2019-02-26 HISTORY — DX: Essential (primary) hypertension: I10

## 2019-02-26 HISTORY — DX: Personal history of other diseases of the digestive system: Z87.19

## 2019-02-26 HISTORY — DX: Personal history of urinary calculi: Z87.442

## 2019-02-26 LAB — CBC WITH DIFFERENTIAL/PLATELET
Abs Immature Granulocytes: 0.01 10*3/uL (ref 0.00–0.07)
Basophils Absolute: 0 10*3/uL (ref 0.0–0.1)
Basophils Relative: 1 %
Eosinophils Absolute: 0.1 10*3/uL (ref 0.0–0.5)
Eosinophils Relative: 1 %
HCT: 44.9 % (ref 39.0–52.0)
Hemoglobin: 15.4 g/dL (ref 13.0–17.0)
Immature Granulocytes: 0 %
Lymphocytes Relative: 36 %
Lymphs Abs: 2.7 10*3/uL (ref 0.7–4.0)
MCH: 33.3 pg (ref 26.0–34.0)
MCHC: 34.3 g/dL (ref 30.0–36.0)
MCV: 97.2 fL (ref 80.0–100.0)
Monocytes Absolute: 0.7 10*3/uL (ref 0.1–1.0)
Monocytes Relative: 9 %
Neutro Abs: 3.9 10*3/uL (ref 1.7–7.7)
Neutrophils Relative %: 53 %
Platelets: 305 10*3/uL (ref 150–400)
RBC: 4.62 MIL/uL (ref 4.22–5.81)
RDW: 11.8 % (ref 11.5–15.5)
WBC: 7.5 10*3/uL (ref 4.0–10.5)
nRBC: 0 % (ref 0.0–0.2)

## 2019-02-26 LAB — BASIC METABOLIC PANEL
Anion gap: 10 (ref 5–15)
BUN: 14 mg/dL (ref 8–23)
CO2: 24 mmol/L (ref 22–32)
Calcium: 9.4 mg/dL (ref 8.9–10.3)
Chloride: 105 mmol/L (ref 98–111)
Creatinine, Ser: 1 mg/dL (ref 0.61–1.24)
GFR calc Af Amer: >60 mL/min (ref >60)
GFR calc non Af Amer: >60 mL/min (ref >60)
Glucose, Bld: 122 mg/dL — ABNORMAL HIGH (ref 70–99)
Potassium: 3.8 mmol/L (ref 3.5–5.1)
Sodium: 139 mmol/L (ref 135–145)

## 2019-02-26 NOTE — Progress Notes (Signed)
PCP - Domenick Gong, MD Cardiologist - Denies  PPM/ICD - Denies Device Orders - N/A Rep Notified N/A  Chest x-ray - N/A EKG - 02/26/2019 Stress Test - Per patient, back in the 1980s, unsure as to the reason and where it was done, No follow up was required. ECHO - Per patient, back in the 1980s, unsure as to the reason and where it was done,  No follow up was required. Cardiac Cath - Denies  Sleep Study - Denies CPAP - N/A  Fasting Blood Sugar - N/A Checks Blood Sugar _N/A__ times a day  Blood Thinner Instructions: N/A Aspirin Instructions: N/A  ERAS Protcol - N/A PRE-SURGERY Ensure - N/A  COVID TEST- 03/02/2019   Anesthesia review: No  Patient denies shortness of breath, fever, cough and chest pain at PAT appointment   Coronavirus Screening  Have you experienced the following symptoms:  Cough yes/no: No Fever (>100.41F)  yes/no: No Runny nose yes/no: No Sore throat yes/no: No Difficulty breathing/shortness of breath  yes/no: No  Have you or a family member traveled in the last 14 days and where? yes/no: No   If the patient indicates "YES" to the above questions, their PAT will be rescheduled to limit the exposure to others and, the surgeon will be notified. THE PATIENT WILL NEED TO BE ASYMPTOMATIC FOR 14 DAYS.   If the patient is not experiencing any of these symptoms, the PAT nurse will instruct them to NOT bring anyone with them to their appointment since they may have these symptoms or traveled as well.   Please remind your patients and families that hospital visitation restrictions are in effect and the importance of the restrictions.     Patient verbalized understanding of instructions that were given to them at the PAT appointment. Patient was also instructed that they will need to review over the PAT instructions again at home before surgery.

## 2019-03-02 ENCOUNTER — Other Ambulatory Visit (HOSPITAL_COMMUNITY)
Admission: RE | Admit: 2019-03-02 | Discharge: 2019-03-02 | Disposition: A | Discharge status: Home / Self Care | Payer: Medicare Other | Source: Ambulatory Visit | Attending: Surgery | Admitting: Surgery

## 2019-03-02 DIAGNOSIS — Z01812 Encounter for preprocedural laboratory examination: Secondary | ICD-10-CM | POA: Insufficient documentation

## 2019-03-02 DIAGNOSIS — Z20828 Contact with and (suspected) exposure to other viral communicable diseases: Secondary | ICD-10-CM | POA: Insufficient documentation

## 2019-03-03 LAB — NOVEL CORONAVIRUS, NAA (HOSP ORDER, SEND-OUT TO REF LAB; TAT 18-24 HRS): SARS-CoV-2, NAA: NOT DETECTED

## 2019-03-04 ENCOUNTER — Encounter (HOSPITAL_COMMUNITY): Payer: Self-pay | Admitting: Anesthesiology

## 2019-03-04 MED ORDER — BUPIVACAINE LIPOSOME 1.3 % IJ SUSP
20.0000 mL | INTRAMUSCULAR | Status: AC
  Administered 2019-03-05: 20 mL
  Filled 2019-03-04: qty 20

## 2019-03-04 NOTE — Anesthesia Preprocedure Evaluation (Addendum)
Anesthesia Evaluation  Patient identified by MRN, date of birth, ID band Patient awake    Reviewed: Allergy & Precautions, NPO status , Patient's Chart, lab work & pertinent test results  Airway Mallampati: II  TM Distance: >3 FB Neck ROM: Full    Dental  (+) Teeth Intact   Pulmonary former smoker,    Pulmonary exam normal breath sounds clear to auscultation       Cardiovascular hypertension, Pt. 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  Neuromuscular disease    GI/Hepatic Neg liver ROS, hiatal hernia, GERD  Medicated and Controlled,  Endo/Other  Hyperlipidemia   Renal/GU negative Renal ROS  negative genitourinary   Musculoskeletal Right inguinal hernia   Abdominal   Peds  Hematology negative hematology ROS (+)   Anesthesia Other Findings   Reproductive/Obstetrics                            Anesthesia Physical Anesthesia Plan  ASA: II  Anesthesia Plan: General   Post-op Pain Management:    Induction: Intravenous  PONV Risk Score and Plan: 3 and Ondansetron and Treatment may vary due to age or medical condition  Airway Management Planned: LMA  Additional Equipment:   Intra-op Plan:   Post-operative Plan: Extubation in OR  Informed Consent: I have reviewed the patients History and Physical, chart, labs and discussed the procedure including the risks, benefits and alternatives for the proposed anesthesia with the patient or authorized representative who has indicated his/her understanding and acceptance.     Dental advisory given  Plan Discussed with: CRNA and Surgeon  Anesthesia Plan Comments:        Anesthesia Quick Evaluation

## 2019-03-05 ENCOUNTER — Ambulatory Visit (HOSPITAL_COMMUNITY): Payer: Medicare Other | Admitting: Anesthesiology

## 2019-03-05 ENCOUNTER — Ambulatory Visit (HOSPITAL_COMMUNITY)
Admission: RE | Admit: 2019-03-05 | Discharge: 2019-03-05 | Disposition: A | Discharge status: Home / Self Care | Payer: Medicare Other | Attending: Surgery | Admitting: Surgery

## 2019-03-05 ENCOUNTER — Encounter (HOSPITAL_COMMUNITY)
Admission: RE | Disposition: A | Discharge status: Home / Self Care | Payer: Self-pay | Source: Home / Self Care | Attending: Surgery

## 2019-03-05 ENCOUNTER — Other Ambulatory Visit: Payer: Self-pay

## 2019-03-05 ENCOUNTER — Encounter (HOSPITAL_COMMUNITY): Payer: Self-pay | Admitting: *Deleted

## 2019-03-05 DIAGNOSIS — Z79899 Other long term (current) drug therapy: Secondary | ICD-10-CM | POA: Diagnosis not present

## 2019-03-05 DIAGNOSIS — E785 Hyperlipidemia, unspecified: Secondary | ICD-10-CM | POA: Diagnosis not present

## 2019-03-05 DIAGNOSIS — I10 Essential (primary) hypertension: Secondary | ICD-10-CM | POA: Diagnosis not present

## 2019-03-05 DIAGNOSIS — K4090 Unilateral inguinal hernia, without obstruction or gangrene, not specified as recurrent: Secondary | ICD-10-CM | POA: Diagnosis not present

## 2019-03-05 DIAGNOSIS — Z87891 Personal history of nicotine dependence: Secondary | ICD-10-CM | POA: Insufficient documentation

## 2019-03-05 DIAGNOSIS — D170 Benign lipomatous neoplasm of skin and subcutaneous tissue of head, face and neck: Secondary | ICD-10-CM | POA: Insufficient documentation

## 2019-03-05 DIAGNOSIS — K219 Gastro-esophageal reflux disease without esophagitis: Secondary | ICD-10-CM | POA: Diagnosis not present

## 2019-03-05 HISTORY — PX: INGUINAL HERNIA REPAIR: SHX194

## 2019-03-05 SURGERY — REPAIR, HERNIA, INGUINAL, ADULT
Anesthesia: General | Site: Groin | Laterality: Right

## 2019-03-05 MED ORDER — MIDAZOLAM HCL 2 MG/2ML IJ SOLN
INTRAMUSCULAR | Status: AC
  Filled 2019-03-05: qty 2

## 2019-03-05 MED ORDER — PROPOFOL 10 MG/ML IV BOLUS
INTRAVENOUS | Status: DC | PRN
  Administered 2019-03-05: 150 mg via INTRAVENOUS
  Administered 2019-03-05: 50 mg via INTRAVENOUS

## 2019-03-05 MED ORDER — GABAPENTIN 300 MG PO CAPS
300.0000 mg | ORAL_CAPSULE | ORAL | Status: AC
  Administered 2019-03-05: 300 mg via ORAL
  Filled 2019-03-05: qty 1

## 2019-03-05 MED ORDER — ONDANSETRON HCL 4 MG/2ML IJ SOLN
INTRAMUSCULAR | Status: DC | PRN
  Administered 2019-03-05: 4 mg via INTRAVENOUS

## 2019-03-05 MED ORDER — LIDOCAINE 2% (20 MG/ML) 5 ML SYRINGE
INTRAMUSCULAR | Status: DC | PRN
  Administered 2019-03-05: 40 mg via INTRAVENOUS

## 2019-03-05 MED ORDER — PROPOFOL 10 MG/ML IV BOLUS
INTRAVENOUS | Status: AC
  Filled 2019-03-05: qty 20

## 2019-03-05 MED ORDER — PHENYLEPHRINE 40 MCG/ML (10ML) SYRINGE FOR IV PUSH (FOR BLOOD PRESSURE SUPPORT)
PREFILLED_SYRINGE | INTRAVENOUS | Status: AC
  Filled 2019-03-05: qty 10

## 2019-03-05 MED ORDER — OXYCODONE HCL 5 MG/5ML PO SOLN: 5.0000 mg | Freq: Once | ORAL | Status: DC | PRN

## 2019-03-05 MED ORDER — FENTANYL CITRATE (PF) 100 MCG/2ML IJ SOLN: 25.0000 ug | INTRAMUSCULAR | Status: DC | PRN

## 2019-03-05 MED ORDER — SODIUM CHLORIDE 0.9 % IV SOLN: 250.0000 mL | INTRAVENOUS | Status: DC | PRN

## 2019-03-05 MED ORDER — CHLORHEXIDINE GLUCONATE 4 % EX LIQD: 60.0000 mL | Freq: Once | CUTANEOUS | Status: DC

## 2019-03-05 MED ORDER — ACETAMINOPHEN 650 MG RE SUPP: 650.0000 mg | RECTAL | Status: DC | PRN

## 2019-03-05 MED ORDER — ACETAMINOPHEN 500 MG PO TABS
1000.0000 mg | ORAL_TABLET | ORAL | Status: AC
  Administered 2019-03-05: 1000 mg via ORAL
  Filled 2019-03-05: qty 2

## 2019-03-05 MED ORDER — OXYCODONE HCL 5 MG PO TABS: 5.0000 mg | ORAL_TABLET | ORAL | Status: DC | PRN

## 2019-03-05 MED ORDER — DOCUSATE SODIUM 100 MG PO CAPS: 100.0000 mg | ORAL_CAPSULE | Freq: Two times a day (BID) | ORAL | 0 refills | Status: AC

## 2019-03-05 MED ORDER — SODIUM CHLORIDE 0.9% FLUSH: 3.0000 mL | INTRAVENOUS | Status: DC | PRN

## 2019-03-05 MED ORDER — DEXAMETHASONE SODIUM PHOSPHATE 4 MG/ML IJ SOLN
INTRAMUSCULAR | Status: DC | PRN
  Administered 2019-03-05: 5 mg via INTRAVENOUS

## 2019-03-05 MED ORDER — OXYCODONE HCL 5 MG PO TABS: 5.0000 mg | ORAL_TABLET | Freq: Three times a day (TID) | ORAL | 0 refills | Status: DC | PRN

## 2019-03-05 MED ORDER — ONDANSETRON HCL 4 MG/2ML IJ SOLN
INTRAMUSCULAR | Status: AC
  Filled 2019-03-05: qty 2

## 2019-03-05 MED ORDER — FENTANYL CITRATE (PF) 250 MCG/5ML IJ SOLN
INTRAMUSCULAR | Status: AC
  Filled 2019-03-05: qty 5

## 2019-03-05 MED ORDER — LACTATED RINGERS IV SOLN
INTRAVENOUS | Status: DC
  Administered 2019-03-05 (×2): via INTRAVENOUS

## 2019-03-05 MED ORDER — PHENYLEPHRINE 40 MCG/ML (10ML) SYRINGE FOR IV PUSH (FOR BLOOD PRESSURE SUPPORT)
PREFILLED_SYRINGE | INTRAVENOUS | Status: DC | PRN
  Administered 2019-03-05 (×3): 80 ug via INTRAVENOUS

## 2019-03-05 MED ORDER — SODIUM CHLORIDE 0.9% FLUSH: 3.0000 mL | Freq: Two times a day (BID) | INTRAVENOUS | Status: DC

## 2019-03-05 MED ORDER — FENTANYL CITRATE (PF) 100 MCG/2ML IJ SOLN
INTRAMUSCULAR | Status: DC | PRN
  Administered 2019-03-05 (×2): 50 ug via INTRAVENOUS
  Administered 2019-03-05: 25 ug via INTRAVENOUS

## 2019-03-05 MED ORDER — DEXAMETHASONE SODIUM PHOSPHATE 10 MG/ML IJ SOLN
INTRAMUSCULAR | Status: AC
  Filled 2019-03-05: qty 1

## 2019-03-05 MED ORDER — LIDOCAINE 2% (20 MG/ML) 5 ML SYRINGE
INTRAMUSCULAR | Status: AC
  Filled 2019-03-05: qty 5

## 2019-03-05 MED ORDER — ONDANSETRON HCL 4 MG/2ML IJ SOLN: 4.0000 mg | Freq: Once | INTRAMUSCULAR | Status: DC | PRN

## 2019-03-05 MED ORDER — ACETAMINOPHEN 325 MG PO TABS: 650.0000 mg | ORAL_TABLET | ORAL | Status: DC | PRN

## 2019-03-05 MED ORDER — BUPIVACAINE-EPINEPHRINE (PF) 0.25% -1:200000 IJ SOLN
INTRAMUSCULAR | Status: AC
  Filled 2019-03-05: qty 20

## 2019-03-05 MED ORDER — FENTANYL CITRATE (PF) 100 MCG/2ML IJ SOLN
INTRAMUSCULAR | Status: AC
  Filled 2019-03-05: qty 2

## 2019-03-05 MED ORDER — CEFAZOLIN SODIUM-DEXTROSE 2-4 GM/100ML-% IV SOLN
2.0000 g | INTRAVENOUS | Status: AC
  Administered 2019-03-05: 10:00:00 2 g via INTRAVENOUS
  Filled 2019-03-05: qty 100

## 2019-03-05 MED ORDER — BUPIVACAINE-EPINEPHRINE 0.25% -1:200000 IJ SOLN
INTRAMUSCULAR | Status: DC | PRN
  Administered 2019-03-05: 10 mL

## 2019-03-05 MED ORDER — 0.9 % SODIUM CHLORIDE (POUR BTL) OPTIME
TOPICAL | Status: DC | PRN
  Administered 2019-03-05: 1000 mL

## 2019-03-05 MED ORDER — OXYCODONE HCL 5 MG PO TABS: 5.0000 mg | ORAL_TABLET | Freq: Once | ORAL | Status: DC | PRN

## 2019-03-05 SURGICAL SUPPLY — 45 items
APL PRP STRL LF DISP 70% ISPRP (MISCELLANEOUS) ×1
APL SKNCLS STERI-STRIP NONHPOA (GAUZE/BANDAGES/DRESSINGS) ×1
BENZOIN TINCTURE PRP APPL 2/3 (GAUZE/BANDAGES/DRESSINGS) ×2 IMPLANT
BLADE CLIPPER SURG (BLADE) ×2 IMPLANT
CANISTER SUCT 3000ML PPV (MISCELLANEOUS) IMPLANT
CHLORAPREP W/TINT 26 (MISCELLANEOUS) ×2 IMPLANT
COVER SURGICAL LIGHT HANDLE (MISCELLANEOUS) ×2 IMPLANT
COVER WAND RF STERILE (DRAPES) ×1 IMPLANT
DRAIN PENROSE 1/2X12 LTX STRL (WOUND CARE) IMPLANT
DRAPE LAPAROTOMY TRNSV 102X78 (DRAPES) IMPLANT
DRSG TEGADERM 4X4.75 (GAUZE/BANDAGES/DRESSINGS) ×2 IMPLANT
ELECT REM PT RETURN 9FT ADLT (ELECTROSURGICAL) ×2
ELECTRODE REM PT RTRN 9FT ADLT (ELECTROSURGICAL) ×1 IMPLANT
GAUZE 4X4 16PLY RFD (DISPOSABLE) ×2 IMPLANT
GAUZE SPONGE 4X4 12PLY STRL (GAUZE/BANDAGES/DRESSINGS) ×2 IMPLANT
GAUZE SPONGE 4X4 12PLY STRL LF (GAUZE/BANDAGES/DRESSINGS) ×1 IMPLANT
GLOVE BIO SURGEON STRL SZ 6 (GLOVE) ×2 IMPLANT
GLOVE INDICATOR 6.5 STRL GRN (GLOVE) ×2 IMPLANT
GOWN STRL REUS W/ TWL LRG LVL3 (GOWN DISPOSABLE) ×2 IMPLANT
GOWN STRL REUS W/TWL LRG LVL3 (GOWN DISPOSABLE) ×4
KIT BASIN OR (CUSTOM PROCEDURE TRAY) ×2 IMPLANT
KIT TURNOVER KIT B (KITS) ×2 IMPLANT
MESH ULTRAPRO 3X6 7.6X15CM (Mesh General) ×1 IMPLANT
NDL HYPO 25GX1X1/2 BEV (NEEDLE) ×1 IMPLANT
NEEDLE HYPO 25GX1X1/2 BEV (NEEDLE) ×2 IMPLANT
NS IRRIG 1000ML POUR BTL (IV SOLUTION) ×2 IMPLANT
PACK GENERAL/GYN (CUSTOM PROCEDURE TRAY) ×2 IMPLANT
PAD ARMBOARD 7.5X6 YLW CONV (MISCELLANEOUS) ×2 IMPLANT
PENCIL SMOKE EVACUATOR (MISCELLANEOUS) ×2 IMPLANT
STRIP CLOSURE SKIN 1/2X4 (GAUZE/BANDAGES/DRESSINGS) ×2 IMPLANT
SUT ETHIBOND 0 MO6 C/R (SUTURE) ×2 IMPLANT
SUT MNCRL AB 4-0 PS2 18 (SUTURE) ×2 IMPLANT
SUT SILK 3 0 (SUTURE) ×2
SUT SILK 3-0 18XBRD TIE 12 (SUTURE) IMPLANT
SUT VIC AB 0 CT2 27 (SUTURE) ×2 IMPLANT
SUT VIC AB 2-0 SH 27 (SUTURE) ×2
SUT VIC AB 2-0 SH 27X BRD (SUTURE) ×1 IMPLANT
SUT VIC AB 3-0 SH 27 (SUTURE) ×4
SUT VIC AB 3-0 SH 27XBRD (SUTURE) ×1 IMPLANT
SUT VICRYL AB 3 0 TIES (SUTURE) ×2 IMPLANT
SYR CONTROL 10ML LL (SYRINGE) ×2 IMPLANT
TOWEL GREEN STERILE (TOWEL DISPOSABLE) ×2 IMPLANT
TOWEL GREEN STERILE FF (TOWEL DISPOSABLE) ×2 IMPLANT
TRAY FOLEY W/BAG SLVR 16FR (SET/KITS/TRAYS/PACK) ×2
TRAY FOLEY W/BAG SLVR 16FR ST (SET/KITS/TRAYS/PACK) IMPLANT

## 2019-03-05 NOTE — Anesthesia Procedure Notes (Signed)
Procedure Name: LMA Insertion Date/Time: 03/05/2019 10:07 AM Performed by: Alain Marion, CRNA Pre-anesthesia Checklist: Patient identified, Emergency Drugs available, Suction available and Patient being monitored Patient Re-evaluated:Patient Re-evaluated prior to induction Oxygen Delivery Method: Circle System Utilized Preoxygenation: Pre-oxygenation with 100% oxygen Induction Type: IV induction Ventilation: Mask ventilation without difficulty LMA: LMA inserted LMA Size: 5.0 Number of attempts: 1 Airway Equipment and Method: Bite block Placement Confirmation: positive ETCO2 Tube secured with: Tape Dental Injury: Teeth and Oropharynx as per pre-operative assessment

## 2019-03-05 NOTE — Op Note (Signed)
Operative Note  Derrick White  [id number]  IN:[id number]  03/05/2019   Surgeon: Clovis Riley MD FACS   Assistant: OR staff   Procedure performed: Open inguinal hernia repair with UltraPro mesh: right, indirect   Preop diagnosis:  right inguinal hernia   Post-op diagnosis/intraop findings: indirect inguinal hernia   Specimens: none   EBL: 5cc   Complications: none   Description of procedure: After obtaining informed consent the patient was taken to the operating room and placed supine on operating room table where general LMA anesthesia was initiated, preoperative antibiotics were administered, SCDs applied, and a formal timeout was performed. Foley catheter inserted which is removed at the end of the case. The groin was clipped, prepped and draped in the usual sterile fashion. An oblique incision was made in the just above the inguinal ligament after infiltrating the tissues with local anesthetic. Soft tissues were dissected using electrocautery until the external oblique aponeurosis was encountered. This was divided sharply to expand the external ring. A plane was bluntly developed between the spermatic cord and the external oblique. The ilioinguinal nerve was identified, divided between hemostats and either end ligated with 3-0 vicryl ties. The spermatic cord was then bluntly dissected away from the pubic tubercle and encircled with a Penrose. Inspection of the inguinal anatomy revealed an indirect hernis. The indirect hernia sac was bluntly dissected away from the cord structures. Once we had affirmatively identified the sac, it was carefully opened in a region that was very thin. Inspection confirmed communication with the peritoneal cavity with no bowel contained currently. The sac was suture ligated with a 2-0 vicryl and reduced into the abdomen. The internal ring was narrowed with a 2-0 vicryl.  A small direct inguinal hernia was reduced, and a figure of eight suture of 3-0 Vicryl was  used to reapproximate the floor of the inguinal canal to flatten the surface and keep the hernia out of the field. A 3 x 6 piece of ultra Pro mesh was brought onto the field and trimmed to approximate the field. This was tacked to the pubic tubercle fascia using 0 ethibond. Interrupted 0 ethibonds were then used to tack the mesh to the inferior shelving edge and to the internal oblique superiorly. The tails of the mesh were wrapped around the spermatic cord, ensuring adequate room for the cord, and tacked to each other with 0 ethibond, and then directed laterally to lie flat. Hemostasis was ensured within the wound. The Penrose was removed. The external oblique aponeurosis was reapproximated with a running 3-0 Vicryl to re-create a narrowed external ring. More local was infiltrated around the pubic tubercle and in the plane just below the external oblique. The Scarpa's was reapproximated with interrupted 3-0 Vicryls. The skin was closed with a running subcuticular Monocryl. The remainder of the local was injected in the subcutaneous and subcuticular space. The field was then cleaned, benzoin and Steri-Strips and sterile bandage were applied. Both testicles were palpated in the scrotum at the end of the case. The patient was then awakened extubated and taken to PACU in stable condition.    All counts were correct at the completion of the case

## 2019-03-05 NOTE — Discharge Instructions (Signed)
HERNIA REPAIR: POST OP INSTRUCTIONS  ######################################################################  EAT Gradually transition to a high fiber diet with a fiber supplement over the next few weeks after discharge.  Start with a pureed / full liquid diet (see below)  WALK Walk an hour a day.  Control your pain to do that.    CONTROL PAIN Control pain so that you can walk, sleep, tolerate sneezing/coughing, and go up/down stairs.  HAVE A BOWEL MOVEMENT DAILY Keep your bowels regular to avoid problems.  OK to try a laxative to override constipation.  OK to use an antidairrheal to slow down diarrhea.  Call if not better after 2 tries  CALL IF YOU HAVE PROBLEMS/CONCERNS Call if you are still struggling despite following these instructions. Call if you have concerns not answered by these instructions  ######################################################################    1. DIET: Follow a light bland diet & liquids the first 24 hours after arrival home, such as soup, liquids, starches, etc.  Be sure to drink plenty of fluids.  Quickly advance to a usual solid diet within a few days.  Avoid fast food or heavy meals as your are more likely to get nauseated or have irregular bowels.  A low-sugar, high-fiber diet for the rest of your life is ideal.   2. Take your usually prescribed home medications unless otherwise directed.  3. PAIN CONTROL: a. Pain is best controlled by a usual combination of three different methods TOGETHER: i. Ice/Heat ii. Over the counter pain medication iii. Prescription pain medication b. Most patients will experience some swelling and bruising around the hernia(s) such as the bellybutton, groins, or old incisions.  Ice packs or heating pads (30-60 minutes up to 6 times a day) will help. Use ice for the first few days to help decrease swelling and bruising, then switch to heat to help relax tight/sore spots and speed recovery.  Some people prefer to use ice  alone, heat alone, alternating between ice & heat.  Experiment to what works for you.  Swelling and bruising can take several weeks to resolve.   c. It is helpful to take an over-the-counter pain medication regularly for the first few weeks.  Choose one of the following that works best for you: i. Naproxen (Aleve, etc)  Two 220mg  tabs twice a day OR Ibuprofen (Advil, etc) Three 200mg  tabs four times a day (every meal & bedtime) AND ii. Acetaminophen (Tylenol, etc) 325-650mg  four times a day (every meal & bedtime) d. A  prescription for pain medication should be given to you upon discharge.  Take your pain medication as prescribed.  i. If you are having problems/concerns with the prescription medicine (does not control pain, nausea, vomiting, rash, itching, etc), please call us [phone] to see if we need to switch you to a different pain medicine that will work better for you and/or control your side effect better. ii. If you need a refill on your pain medication, please contact your pharmacy.  They will contact our office to request authorization. Prescriptions will not be filled after 5 pm or on week-ends.  4. Avoid getting constipated.  Between the surgery and the pain medications, it is common to experience some constipation.  Increasing fluid intake and taking a fiber supplement (such as Metamucil, Citrucel, FiberCon, MiraLax, etc) 1-2 times a day regularly will usually help prevent this problem from occurring.  A mild laxative (prune juice, Milk of Magnesia, MiraLax, etc) should be taken according to package directions if there are no bowel movements after  48 hours.    5. Wash / shower every day, starting 2 days after surgery.  You may shower over the steri strips as they are waterproof.  No rubbing, scrubbing, lotions or ointments to incisions. No soaking or swimming for at least 2 weeks.  6. Remove your outer bandage 2 days after surgery. Steri strips will peel off after 1-2 weeks. You may  replace a dressing/Band-Aid to cover the incision for comfort if you wish. You may leave the incisions open to air.   Continue to shower over incision(s) after the dressing is off.  7. ACTIVITIES as tolerated:   a. You may resume regular (light) daily activities beginning the next day--such as daily self-care, walking, climbing stairs--gradually increasing activities as tolerated.  Control your pain so that you can walk an hour a day.  If you can walk 30 minutes without difficulty, it is safe to try more intense activity such as jogging, treadmill, bicycling, low-impact aerobics, swimming, etc. b. Refrain from the most intensive and strenuous activity such as sit-ups, heavy lifting, contact sports, etc  Refrain from any heavy lifting or straining (more than 20lb) until 6 weeks after surgery.   c. DO NOT PUSH THROUGH PAIN.  Let pain be your guide: If it hurts to do something, don't do it.  Pain is your body warning you to avoid that activity for another week until the pain goes down. d. You may drive when you are no longer taking prescription pain medication, you can comfortably wear a seatbelt, and you can safely maneuver your car and apply brakes. e. Dennis Bast may have sexual intercourse when it is comfortable.   8. FOLLOW UP in our office a. Please call [facility] at [phone] to set up an appointment to see your surgeon in the office for a follow-up appointment approximately 2-3 weeks after your surgery. b. Make sure that you call for this appointment the day you arrive home to insure a convenient appointment time.  9.  If you have disability of FMLA / Family leave forms, please bring the forms to the office for processing.  (do not give to your surgeon).  WHEN TO CALL us [phone]: 1. Poor pain control 2. Reactions / problems with new medications (rash/itching, nausea, etc)  3. Fever over 101.5 F (38.5 C) 4. Inability to urinate 5. Nausea and/or vomiting 6. Worsening swelling or  bruising 7. Continued bleeding from incision. 8. Increased pain, redness, or drainage from the incision   The clinic staff is available to answer your questions during regular business hours (8:30am-5pm).  Please dont hesitate to call and ask to speak to one of our nurses for clinical concerns.   If you have a medical emergency, go to the nearest emergency room or call 911.  A surgeon from [facility] is always on call at the hospitals in [facility], [place], [place], [place], [place]  [address] ?  [address] [address], [place], [place]   [address] MAIN: [phone] ? TOLL FREE: [phone] ? FAX: [phone] [email or web address]

## 2019-03-05 NOTE — Transfer of Care (Signed)
Immediate Anesthesia Transfer of Care Note  Patient: Derrick White  Procedure(s) Performed: OPEN RIGHT INGUINAL HERNIA REPAIR WITH MESH (Right Groin)  Patient Location: PACU  Anesthesia Type:General  Level of Consciousness: awake, alert  and oriented  Airway & Oxygen Therapy: Patient Spontanous Breathing  Post-op Assessment: Report given to RN, Post -op Vital signs reviewed and stable and Patient moving all extremities  Post vital signs: Reviewed and stable  Last Vitals:  Vitals Value Taken Time  BP    Temp    Pulse 69 03/05/19 1146  Resp 12 03/05/19 1146  SpO2 92 % 03/05/19 1146  Vitals shown include unvalidated device data.  Last Pain:  Vitals:   03/05/19 0830  TempSrc: Oral  PainSc: 0-No pain      Patients Stated Pain Goal: 3 ([id number])  Complications: No apparent anesthesia complications

## 2019-03-05 NOTE — Anesthesia Postprocedure Evaluation (Signed)
Anesthesia Post Note  Patient: Delorean E Broughton  Procedure(s) Performed: OPEN RIGHT INGUINAL HERNIA REPAIR WITH MESH (Right Groin)     Patient location during evaluation: PACU Anesthesia Type: General Level of consciousness: awake and alert and oriented Pain management: pain level controlled Vital Signs Assessment: post-procedure vital signs reviewed and stable Respiratory status: spontaneous breathing, nonlabored ventilation and respiratory function stable Cardiovascular status: blood pressure returned to baseline and stable Postop Assessment: no apparent nausea or vomiting Anesthetic complications: no    Last Vitals:  Vitals:   03/05/19 1145 03/05/19 1200  BP: (!) 141/82 133/74  Pulse: 67 61  Resp: 14 13  Temp: (!) 36.1 C   SpO2: 96% 94%    Last Pain:  Vitals:   03/05/19 1200  TempSrc:   PainSc: 0-No pain                 Radhika Dershem A.

## 2019-03-08 ENCOUNTER — Encounter (HOSPITAL_COMMUNITY): Payer: Self-pay | Admitting: Surgery

## 2019-03-16 ENCOUNTER — Telehealth: Payer: Self-pay | Admitting: Neurology

## 2019-03-16 ENCOUNTER — Other Ambulatory Visit: Payer: Self-pay

## 2019-03-16 ENCOUNTER — Ambulatory Visit (INDEPENDENT_AMBULATORY_CARE_PROVIDER_SITE_OTHER): Payer: Medicare Other | Admitting: Neurology

## 2019-03-16 ENCOUNTER — Encounter: Payer: Self-pay | Admitting: Neurology

## 2019-03-16 VITALS — BP 125/65 | HR 63 | Temp 97.8°F | Ht 70.0 in | Wt 184.8 lb

## 2019-03-16 DIAGNOSIS — R269 Unspecified abnormalities of gait and mobility: Secondary | ICD-10-CM | POA: Diagnosis not present

## 2019-03-16 DIAGNOSIS — M79672 Pain in left foot: Secondary | ICD-10-CM

## 2019-03-16 DIAGNOSIS — D225 Melanocytic nevi of trunk: Secondary | ICD-10-CM | POA: Diagnosis not present

## 2019-03-16 DIAGNOSIS — R4781 Slurred speech: Secondary | ICD-10-CM | POA: Insufficient documentation

## 2019-03-16 DIAGNOSIS — L570 Actinic keratosis: Secondary | ICD-10-CM | POA: Diagnosis not present

## 2019-03-16 DIAGNOSIS — R413 Other amnesia: Secondary | ICD-10-CM | POA: Diagnosis not present

## 2019-03-16 DIAGNOSIS — L821 Other seborrheic keratosis: Secondary | ICD-10-CM | POA: Diagnosis not present

## 2019-03-16 DIAGNOSIS — R278 Other lack of coordination: Secondary | ICD-10-CM

## 2019-03-16 DIAGNOSIS — L738 Other specified follicular disorders: Secondary | ICD-10-CM | POA: Diagnosis not present

## 2019-03-16 DIAGNOSIS — R208 Other disturbances of skin sensation: Secondary | ICD-10-CM | POA: Diagnosis not present

## 2019-03-16 HISTORY — DX: Slurred speech: R47.81

## 2019-03-16 HISTORY — DX: Unspecified abnormalities of gait and mobility: R26.9

## 2019-03-16 HISTORY — DX: Other amnesia: R41.3

## 2019-03-16 HISTORY — DX: Other lack of coordination: R27.8

## 2019-03-16 NOTE — Telephone Encounter (Signed)
I did talk to the daughter if Mr. Samson update his DPR.

## 2019-03-16 NOTE — Progress Notes (Signed)
[facility]  PATIENT: Derrick White DOB: April 27, 1941  REFERRING DOCTOR OR PCP:  Domenick Gong SOURCE: patient, records in EPIC from [place] and [facility], MRI results, EMG/NCV results  _________________________________   HISTORICAL  CHIEF COMPLAINT:  Chief Complaint  Patient presents with  . Follow-up    RM 12 with wife (temp: 97.6). Last seen 06/15/2018. Last seen for L foot pain, neuropathy, mild cog. impairment. Stopped nortriptyline about 2 months ago, states it was ineffective. Reports no new sx since last seen.    HISTORY OF PRESENT ILLNESS:  Vinal Goveia is a 78 yo man with left ankle and foot pain.    Update 03/16/19: He is off all of his medications for the foot pain.   He has numbness and it becomes painful.   We tried several medications.  The nortriptyline, oxcarbazepine and lamotrigine had not helped much.  Lidocaine ointment did not help.  He does not note much trouble with cognition though he has trouble coming up with names at times.   He feels his abilities to balance a checkbook and focus are about the same.     He has had slurred speech x 1 year and it has progressed.   He has no trouble with eye movements.   He trouble with swallowing but his daughter sent a message that he was having some issues with this.Marland Kitchen  His handwriting is worse.  He feels his gait has worsened somewhat over the last year and he stumbles at times.  An MRI of the brain had shown atrophy and chronic microvascular ischemic change.  The extent of ischemic change in the pons was mild.  There was no brainstem atrophy.  Update 06/15/18: He feels the foot is doing about the same as last visit.     Numbness is in the left foot up to above the ankles.   He has constant discomfort with some superimposed sharp pains and allodynia.    He takes nortriptyline 20 mg nightly.   He slurs his speech some.   Oxcarbazepine and lamotrigine had both helped initially but then seemed  not to. Help as much.    Lidocaine ointment did not help.   He notes more trouble with slurred speech.  He had reported memory issues last visit.   He has trouble with names.   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    Update 12/12/2017: He continues to have numbness and allodynia in the left foot.  At the last visit the dose of Trileptal was increased to 300 mg po bid but it made him dizzy and his voice slurred and he went back down to 1/2 pill bid.  The dizziness is much better but his voice is still slurred.    At the last visit, he said oxcarbazepine was helping more than the lamotrigine so he stopped the lamotrigine.  The previous visit, he said the lamotrigine was helping about 60%, however.   Lyrica was not tolerated.      He also uses lidocaine ointment without much benefit --- helps a bit but quickly wears off.  He has had many podiatric procedures on the left  He notes the slurred speech fluctuates and he has 5-10 minutes of severe slurred speech once a day, but better the last couple weeks.,  He had an MRI June 2019.   I personally reviewed the MRI of the brain.  It shows moderate generalized cortical atrophy and moderate chronic microvascular ischemic changes  in the hemispheres and mild chronic ischemic changes in the pons.  There were no acute findings.  I asked about his short-term memory.  He feels his short-term memory is doing about the same but he notes that his wife sometimes tells him that he never remembers things.    Mood is doing well.  He feels he sleeps well at night.    Update 09/15/2017: He still has some shooting pain in the top of the feet into the toes.   He has dysesthetic pain with allodynia and hyperpathia, worse in the peroneal more than sural distribution.     He feels they probably occur less frequently on the medications.  He is currently 3/4 of a trileptal bid.    He stopped lamotrigine as oxcarbazepine works better.     He occasionally uses  lidocaine.   Hot water seems to help for a while.     He has had some operations in his feet to see if the nerves being clipped would help but it has not.     Update 03/17/2017:   He noted some improvement with lamotrigine initially but a higher dose did not help any more.     The pain is in the feet, worse in the toes and milder at the ankles.    There is allodynia as well as tingling pain.   He feels mildly weak in his feet,    Lyrica was not tolerated.   He has not tried Trileptal.     From 08/08/2016:   He is much better on lamotrigine and is on 50 mg po bid.   He feels he is 60% better.  He still has some pain on the dorsum between the first and second toes but this is much better. He now does not note allodynia with light touch. He has just slight pain with pressure between the first and second, second and third toes.   Pain had been worse old and slightly better with heat but he notes less of the fluctuations now.. Pain is better when he has his feet up.  He feels the current intensity of pain is tolerable.  History:   He broke his left ankle in 2012 or 2013 requiring plate and screws.   He later had the hardware removed without benefit.    A year leater, he had surgery to decompress the peroneal nerve.    He was referred to [facility] and had the left peroneal neurectomy and removal of  L3L4 Morton's Neuroma and L2L3 intermetatarsal nerve decompression procedures.    He was tried on Lyrica but felt very off balanced and mentally slowed so he stopped.  He had mild benefit from lidocaine containing ointments. A couple different types of ointments were tried and the composition of all of them is unknown.     A nerve conduction study performed 05/09/2016 at [facility] showed an absent left superficial peroneal sensory response (was present on the right). Sural sensory response was normal. Left peroneal motor response was normal.  I have reviewed the MRI of the cervical spine dated 01/10/2009. 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 There is multilevel foraminal narrowing, worse to the right at C5-C6 and C6-C7 where there is possible nerve root compression.   Subsequently, a few months later,  he had cervical fusion.    REVIEW OF SYSTEMS: Constitutional: No fevers,  chills, sweats, or change in appetite Eyes: No visual changes, double vision, eye pain Ear, nose and throat: No hearing loss, ear pain, nasal congestion, sore throat Cardiovascular: No chest pain, palpitations Respiratory: No shortness of breath at rest or with exertion.   No wheezes GastrointestinaI: No nausea, vomiting, diarrhea, abdominal pain, fecal incontinence.   Has GERD Genitourinary: No dysuria, urinary retention or frequency.  No nocturia. Musculoskeletal: No current neck pain, back pain Integumentary: No rash, pruritus, skin lesions Neurological: as above Psychiatric: No depression at this time.  No anxiety Endocrine: No palpitations, diaphoresis, change in appetite, change in weigh or increased thirst Hematologic/Lymphatic: No anemia, purpura, petechiae. Allergic/Immunologic: No itchy/runny eyes, nasal congestion, recent allergic reactions, rashes  ALLERGIES: Allergies  Allergen Reactions  . Morphine Hives and Itching  . Codeine Hives and Itching  . Propoxyphene Itching    DARVOCET  . Shrimp [Shellfish Allergy] Rash    HOME MEDICATIONS:  Current Outpatient Medications:  .  acetaminophen (TYLENOL) 500 MG tablet, Take 500 mg by mouth every 6 (six) hours as needed for moderate pain or headache., Disp: , Rfl:  .  Cholecalciferol (VITAMIN D-1000 MAX ST) 1000 units tablet, Take 1,000 Units by mouth 3 (three) times a week. , Disp: , Rfl:  .  docusate sodium (COLACE) 100 MG capsule, Take 1 capsule (100 mg total) by mouth 2 (two) times daily., Disp: 60 capsule, Rfl: 0 .   ibuprofen (ADVIL) 200 MG tablet, Take 200 mg by mouth every 6 (six) hours as needed for headache or moderate pain., Disp: , Rfl:  .  pantoprazole (PROTONIX) 40 MG tablet, Take 40 mg by mouth daily. , Disp: , Rfl: 2 .  pravastatin (PRAVACHOL) 40 MG tablet, Take 40 mg by mouth at bedtime. , Disp: , Rfl: 2 .  Propylene Glycol (SYSTANE BALANCE) 0.6 % SOLN, Place 1 drop into both eyes daily as needed (burning)., Disp: , Rfl:  .  telmisartan (MICARDIS) 80 MG tablet, Take 80 mg by mouth daily. , Disp: , Rfl:   PAST MEDICAL HISTORY: Past Medical History:  Diagnosis Date  . History of hiatal hernia   . History of kidney stones   . Hypertension     PAST SURGICAL HISTORY: See above  FAMILY HISTORY: History reviewed. No pertinent family history.  SOCIAL HISTORY:  Social History   Socioeconomic History  . Marital status: Married    Spouse name: Not on file  . Number of children: Not on file  . Years of education: Not on file  . Highest education level: Not on file  Occupational History  . Not on file  Social Needs  . Financial resource strain: Not on file  . Food insecurity    Worry: Not on file    Inability: Not on file  . Transportation needs    Medical: Not on file    Non-medical: Not on file  Tobacco Use  . Smoking status: Former Smoker    Types: Cigarettes    Quit date: 1995    Years since quitting: 25.8  . Smokeless tobacco: Never Used  Substance and Sexual Activity  . Alcohol use: Not Currently    Alcohol/week: 7.0 standard drinks    Types: 7 Cans of beer per week  . Drug use: Not Currently  . Sexual activity: Not on file  Lifestyle  . Physical activity    Days per week: Not on file    Minutes per session: Not on file  . Stress: Not on file  Relationships  .  Social Herbalist on phone: Not on file    Gets together: Not on file    Attends religious service: Not on file    Active member of club or organization: Not on file    Attends meetings of clubs  or organizations: Not on file    Relationship status: Not on file  . Intimate partner violence    Fear of current or ex partner: Not on file    Emotionally abused: Not on file    Physically abused: Not on file    Forced sexual activity: Not on file  Other Topics Concern  . Not on file  Social History Narrative  . Not on file     PHYSICAL EXAM  Vitals:   03/16/19 0820  BP: 125/65  Pulse: 63  Temp: 97.8 F (36.6 C)  SpO2: 98%  Weight: 184 lb 12.8 oz (83.8 kg)  Height: 5\' 10"  (1.778 m)    Body mass index is 26.52 kg/m.   General: The patient is well-developed and well-nourished and in no acute distress    Skin/Ext: Extremities are without rash or edema.   Surgical scars well healed on left foot and do not look over the superficial peroneal nerve (closer to sural)  Neurologic Exam  Mental status: The patient is alert and oriented x 2 1/2 (right month, year and Tuesday but, off 2 days by date) at the time of the examination. The patient has apparent normal recent and remote memory (was 3/3 at 3 minutes),    ([id number]).   Clock line was okay.  He crowded a couple of the numbers but the overall pattern was fine.  He can copy a geometric figure.  Pattern continuation with no errors.  Has no trouble following commands.   No aphasia  Cranial nerves: Extraocular movements are full.  Facial strength is normal.  Palatal elevation and tongue protrusion are midline.   No obvious hearing deficits are noted.  Motor:  Muscle bulk is normal.   Tone is normal. Strength is  5/5 in legs/feet including EHL   Sensory:   There is reduced sensation with allodynia in the distribution of the superficial peroneal nerve of the left foot sensation and is more normal in the sural distribution.  He also had mildly reduced vibration sensation at the left great toe.    Sensation in the right foot is fairly normal  Coordination: Cerebellar testing reveals good finger-nose-finger good heel-to-shin  bilaterally.  Gait and station: Station is normal.   Gait is mildly wide based favoring left tender foot.  He turns in 3 steps.  He did not have much retropulsion.  Romberg is negative.   Reflexes: Deep tendon reflexes are symmetric and 3 at knees and 1 at ankles.       DIAGNOSTIC DATA (LABS, IMAGING, TESTING) - I reviewed patient records, labs, notes, testing and imaging myself where available.  Lab Results  Component Value Date   WBC 7.5 02/26/2019   HGB 15.4 02/26/2019   HCT 44.9 02/26/2019   MCV 97.2 02/26/2019   PLT 305 02/26/2019      Component Value Date/Time   NA 139 02/26/2019 1336   K 3.8 02/26/2019 1336   CL 105 02/26/2019 1336   CO2 24 02/26/2019 1336   GLUCOSE 122 (H) 02/26/2019 1336   BUN 14 02/26/2019 1336   CREATININE 1.00 02/26/2019 1336   CALCIUM 9.4 02/26/2019 1336   PROT 7.0 04/24/2009 1036   ALBUMIN 4.4 04/24/2009 1036  AST 21 04/24/2009 1036   ALT 23 04/24/2009 1036   ALKPHOS 30 (L) 04/24/2009 1036   BILITOT 0.7 04/24/2009 1036   GFRNONAA >60 02/26/2019 1336   GFRAA >60 02/26/2019 1336       ASSESSMENT AND PLAN  Slurred speech  Memory loss - Plan: MR BRAIN WO CONTRAST  Gait disturbance  Worsened handwriting  Foot pain, left  Dysesthesia   1.   He has had trouble tolerating a lot of different medications for his neuropathic pain.    2.   The etiology of his slurred speech is unclear.  He does not have evidence 5 movement issues or weakness/atrophy that would occur if he had progressive supranuclear palsy or amyotrophic lateral sclerosis, 2 causes of progressive slurred speech.  The MRI of the brain shows some chronic ischemic changes but they are not severe enough to cause progressive slurred speech.  We will check another MRI of the brain to determine if there has been significant progression.  We briefly discussed speech therapy but I am not certain he will get that much benefit from it. stay active and exercise as tolerated.   3.    Cognition appears fairly stable.  We did discuss that I cannot rule out an early stage of a neurodegenerative process and memory could worsen. 4.    He will return to see me in 6 months or sooner if there are new or worsening neurologic symptoms.Marland Kitchen He should call if he has worsening pain or other new symptoms.   Richard A. Felecia Shelling, MD, PhD [id number], [id number] PM Certified in Neurology, Clinical Neurophysiology, Sleep Medicine, Pain Medicine and Neuroimaging  [facility] [address], [place], [place] [address] [phone]

## 2019-03-16 NOTE — Telephone Encounter (Signed)
I checked DPR that was filled out today and only wife listed on DPR. Unable to speak with daughter. Will forward to MD for fyi.

## 2019-03-16 NOTE — Telephone Encounter (Signed)
Medicare/[place]ual of [place] order sent to GI. No auth they will reach out to the patient to schedule.  

## 2019-03-16 NOTE — Telephone Encounter (Signed)
Pts daughter Jeanett Schlein ( not on [place]) is calling wanting to inform Dr Felecia Shelling that pt is having falls, with slurred speech , he gets chocked on his own spit with some drooling at times, he is stiff like a board and has a drop foot sometimes both do it but one is worse than the other, and his left eye droops to where you can almost see the inside of his eyelid. She states she doesn't know if they remember to tell things but she is very concerned. She is wanting to know if someone can call back to speak with her, she understands without being on the [facility] medical information can not be discussed.

## 2019-03-17 ENCOUNTER — Telehealth: Payer: Self-pay | Admitting: *Deleted

## 2019-03-17 NOTE — Telephone Encounter (Signed)
Derrick White- can you contact patient and get updated DPR?

## 2019-03-17 NOTE — Telephone Encounter (Signed)
I spoke to the pt, about adding daughter to his DPR he ask if I could mail a DPR to his home address

## 2019-03-29 ENCOUNTER — Telehealth: Payer: Self-pay | Admitting: Neurology

## 2019-03-29 NOTE — Telephone Encounter (Signed)
Daughter called(which is not on DPR) she just wanted it added that she believes it may be ALS, she mentioned pt's muscle spasms and muscle cramps.   This is a FYI no information was relayed to the daughter(as a result of her not being on the [facility])

## 2019-03-30 ENCOUNTER — Ambulatory Visit
Admission: RE | Admit: 2019-03-30 | Discharge: 2019-03-30 | Disposition: A | Discharge status: Home / Self Care | Payer: Medicare Other | Source: Ambulatory Visit | Attending: Neurology | Admitting: Neurology

## 2019-03-30 ENCOUNTER — Other Ambulatory Visit: Payer: Self-pay

## 2019-03-30 ENCOUNTER — Other Ambulatory Visit: Payer: Self-pay | Admitting: Neurology

## 2019-03-30 DIAGNOSIS — R413 Other amnesia: Secondary | ICD-10-CM | POA: Diagnosis not present

## 2019-03-30 DIAGNOSIS — Z20818 Contact with and (suspected) exposure to other bacterial communicable diseases: Secondary | ICD-10-CM | POA: Diagnosis not present

## 2019-03-30 DIAGNOSIS — R208 Other disturbances of skin sensation: Secondary | ICD-10-CM

## 2019-03-30 DIAGNOSIS — R4781 Slurred speech: Secondary | ICD-10-CM

## 2019-03-30 DIAGNOSIS — R05 Cough: Secondary | ICD-10-CM | POA: Diagnosis not present

## 2019-03-30 DIAGNOSIS — R269 Unspecified abnormalities of gait and mobility: Secondary | ICD-10-CM

## 2019-03-30 DIAGNOSIS — J020 Streptococcal pharyngitis: Secondary | ICD-10-CM | POA: Diagnosis not present

## 2019-03-31 ENCOUNTER — Telehealth: Payer: Self-pay | Admitting: *Deleted

## 2019-03-31 NOTE — Telephone Encounter (Signed)
-----   Message from Britt Bottom, MD sent at 03/30/2019  5:57 PM EST ----- Please let the patient know that the MRI showed atrophy and age-related changes but looked unchanged compared to last year's MRI.  Since his speech and swallowing have worsened, I want to check an EMG/NCV study.  I have placed the orders.Derrick White

## 2019-03-31 NOTE — Telephone Encounter (Signed)
Called and spoke with wife, on [place]. I relayed results per Dr. Felecia Shelling note. She verbalized understanding.  I scheduled EMG/NCS for 05/04/2019 at 845am(Nicole)/930am (Dr. Felecia Shelling). Placed on wait list and advised we will call to schedule sooner appt if any cx.

## 2019-04-28 ENCOUNTER — Encounter: Payer: Self-pay | Admitting: Podiatry

## 2019-04-28 ENCOUNTER — Other Ambulatory Visit: Payer: Self-pay

## 2019-04-28 ENCOUNTER — Ambulatory Visit (INDEPENDENT_AMBULATORY_CARE_PROVIDER_SITE_OTHER): Payer: Medicare Other | Admitting: Podiatry

## 2019-04-28 DIAGNOSIS — M79675 Pain in left toe(s): Secondary | ICD-10-CM | POA: Diagnosis not present

## 2019-04-28 DIAGNOSIS — M79674 Pain in right toe(s): Secondary | ICD-10-CM

## 2019-04-28 DIAGNOSIS — B351 Tinea unguium: Secondary | ICD-10-CM | POA: Diagnosis not present

## 2019-04-28 HISTORY — DX: Pain in right toe(s): M79.674

## 2019-04-28 HISTORY — DX: Tinea unguium: M79.675

## 2019-04-28 HISTORY — DX: Tinea unguium: B35.1

## 2019-04-28 NOTE — Progress Notes (Signed)
Complaint:  Visit Type: Patient presents  to my office for  preventative foot care services. Complaint: Patient states" my nails have grown long and thick and become painful to walk and wear shoes" Patient has been diagnosed with DM with superficial peroneal nerve neuropathy. The patient presents for preventative foot care services.  Podiatric Exam: Vascular: dorsalis pedis and posterior tibial pulses are palpable bilateral. Capillary return is immediate. Temperature gradient is WNL. Skin turgor WNL  Sensorium: Normal Semmes Weinstein monofilament test right foot.  LOPS diminished left foot.. Normal tactile sensation bilaterally. Nail Exam: Pt has thick disfigured discolored nails with subungual debris noted hallux  B/L. Ulcer Exam: There is no evidence of ulcer or pre-ulcerative changes or infection. Orthopedic Exam: Muscle tone and strength are WNL. No limitations in general ROM. No crepitus or effusions noted. Foot type and digits show no abnormalities. HAV  B/L.  Hammer toe 2  B/L Skin: No Porokeratosis. No infection or ulcers  Diagnosis:  Onychomycosis, , Pain in right toe, pain in left toes  Treatment & Plan Procedures and Treatment: Consent by patient was obtained for treatment procedures.   Debridement of mycotic and hypertrophic toenails, 1 through 5 bilateral and clearing of subungual debris. No ulceration, no infection noted.  Return Visit-Office Procedure: Patient instructed to return to the office for a follow up visit 4  months for continued evaluation and treatment.    Yoniel Arkwright DPM 

## 2019-05-04 ENCOUNTER — Ambulatory Visit (INDEPENDENT_AMBULATORY_CARE_PROVIDER_SITE_OTHER): Payer: Medicare Other | Admitting: Neurology

## 2019-05-04 ENCOUNTER — Other Ambulatory Visit: Payer: Self-pay

## 2019-05-04 DIAGNOSIS — G3184 Mild cognitive impairment, so stated: Secondary | ICD-10-CM

## 2019-05-04 DIAGNOSIS — R4781 Slurred speech: Secondary | ICD-10-CM

## 2019-05-04 DIAGNOSIS — R208 Other disturbances of skin sensation: Secondary | ICD-10-CM

## 2019-05-04 DIAGNOSIS — M5417 Radiculopathy, lumbosacral region: Secondary | ICD-10-CM | POA: Diagnosis not present

## 2019-05-04 DIAGNOSIS — M5412 Radiculopathy, cervical region: Secondary | ICD-10-CM

## 2019-05-04 DIAGNOSIS — R269 Unspecified abnormalities of gait and mobility: Secondary | ICD-10-CM

## 2019-05-04 DIAGNOSIS — M6281 Muscle weakness (generalized): Secondary | ICD-10-CM | POA: Diagnosis not present

## 2019-05-04 DIAGNOSIS — G5732 Lesion of lateral popliteal nerve, left lower limb: Secondary | ICD-10-CM

## 2019-05-04 HISTORY — DX: Radiculopathy, lumbosacral region: M54.17

## 2019-05-04 HISTORY — DX: Radiculopathy, cervical region: M54.12

## 2019-05-04 NOTE — Progress Notes (Addendum)
Full Name: Derrick White Gender: Male MRN #: [id number] Date of Birth: [birth year]    Visit Date: 05/04/2019 08:44 Age: 78 Years Examining Physician: Arlice Colt, MD  Referring Physician: Arlice Colt, MD    History: Derrick White is a 78 year old man with slurred speech progressive over the last 2 years, more recent dysphagia, dysesthesias in the feet, left greater than right and gait disturbance.   He has not noted any fasciculations.  He has no ptosis.  Nerve conduction studies: The right ulnar motor response had a delayed distal latency with normal amplitude and conduction velocity.  The right median and left ulnar motor responses were normal.  Bilateral peroneal and the right tibial motor responses had normal distal latencies, amplitudes and conduction velocities.  The left tibial motor response had a borderline reduced amplitude.  Bilateral tibial and right ulnar F-wave responses had normal latencies.  The right ulnar sensory response had a reduced amplitude with borderline peak latency.  The right median and left ulnar sensory responses were normal.  Bilateral sural and superficial peroneal sensory responses were normal for age though the left superficial peroneal sensory response was reduced compared to the right.  Electromyography: Needle EMG of the right arm, both legs and the right face was performed. In the right arm, there was chronic denervation noted in to C6 (C5) innervated muscles and in the first dorsal interosseous muscle.  Other muscles tested showed some polyphasic units but normal recruitment.  Needle EMG of selected muscles of the right leg was essentially normal.  Needle EMG of selected muscles of the left leg showed mild chronic denervation in S1 innervated muscles  Needle EMG of the right mentalis muscle showed small motor unit action potentials with normal recruitment.  Needle EMG of the orbicularis oris and genioglossus muscle was  normal.  Impression: 1.   Chronic right C6 radiculopathy.  There could also be milder C5 and C7 chronic radiculopathies. 2.   Chronic left S1 radiculopathy. 3.   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   There was no evidence of polyneuropathy, motor neuron disease or myopathy.  Angelika Jerrett A. Felecia Shelling, MD, PhD, FAAN Certified in Neurology, Clinical Neurophysiology, Sleep Medicine, Pain Medicine and Neuroimaging Director, [place] [facility] [address] [place], [place] [address] [phone]        [facility]    Nerve / Sites Muscle Latency Ref. Amplitude Ref. Rel Amp Segments Distance Velocity Ref. Area    ms ms mV mV %  cm m/s m/s mVms  R Median - APB     Wrist APB 3.5 ?4.4 4.5 ?4.0 100 Wrist - APB 7   18.6     Upper arm APB 7.6  4.0  88.7 Upper arm - Wrist 23 55 ?49 16.1  R Ulnar - ADM     Wrist ADM 3.8 ?3.3 9.1 ?6.0 100 Wrist - ADM 7   26.4     B.Elbow ADM 7.3  9.2  101 B.Elbow - Wrist 21 59 ?49 29.6     A.Elbow ADM 9.1  9.1  98.4 A.Elbow - B.Elbow 10 58 ?49 29.6         A.Elbow - Wrist      L Ulnar - ADM     Wrist ADM 3.3 ?3.3 10.6 ?6.0 100 Wrist - ADM 7   29.7  B.Elbow ADM 7.5  10.5  98.8 B.Elbow - Wrist 21 50 ?49 29.2     A.Elbow ADM 9.4  10.3  97.8 A.Elbow - B.Elbow 10 51 ?49 27.8         A.Elbow - Wrist      R Peroneal - EDB     Ankle EDB 4.4 ?6.5 8.7 ?2.0 100 Ankle - EDB 9   23.0     Fib head EDB 11.1  7.8  90.4 Fib head - Ankle 30 45 ?44 20.2     Pop fossa EDB 13.3  7.7  97.7 Pop fossa - Fib head 10 46 ?44 19.8         Pop fossa - Ankle      L Peroneal - EDB     Ankle EDB 4.8 ?6.5 8.1 ?2.0 100 Ankle - EDB 9   18.8     Fib head EDB 11.2  7.1  87.8 Fib head - Ankle 30 46 ?44 17.7     Pop fossa EDB 13.4  7.8  109 Pop fossa - Fib head 10 46 ?44 18.9         Pop fossa - Ankle      R Tibial - AH     Ankle AH 5.5 ?5.8 10.2 ?4.0  100 Ankle - AH 9   18.3     Pop fossa AH 14.4  7.1  69.3 Pop fossa - Ankle 41 46 ?41 17.1  L Tibial - AH     Ankle AH 3.6 ?5.8 4.0 ?4.0 100 Ankle - AH 9   10.1     Pop fossa AH 12.7  2.3  57.4 Pop fossa - Ankle 40 44 ?41 9.8                   SNC    Nerve / Sites Rec. Site Peak Lat Ref.  Amp Ref. Segments Distance    ms ms V V  cm  R Sural - Ankle (Calf)     Calf Ankle 3.5 ?4.4 8 ?6 Calf - Ankle 14  L Sural - Ankle (Calf)     Calf Ankle 3.8 ?4.4 6 ?6 Calf - Ankle 14  R Superficial peroneal - Ankle     Lat leg Ankle 4.3 ?4.4 8 ?6 Lat leg - Ankle 14  L Superficial peroneal - Ankle     Lat leg Ankle 3.8 ?4.4 4 ?6 Lat leg - Ankle 14  R Median - Orthodromic (Dig II, Mid palm)     Dig II Wrist 3.3 ?3.4 10 ?10 Dig II - Wrist 13  R Ulnar - Orthodromic, (Dig V, Mid palm)     Dig V Wrist 3.1 ?3.1 2 ?5 Dig V - Wrist 11  L Ulnar - Orthodromic, (Dig V, Mid palm)     Dig V Wrist 2.8 ?3.1 8 ?5 Dig V - Wrist 47                   F  Wave    Nerve F Lat Ref.   ms ms  R Tibial - AH 52.1 ?56.0  R Ulnar - ADM 28.5 ?32.0  L Tibial - AH 50.0 ?56.0           EMG Summary Table    Spontaneous MUAP Recruitment  Muscle IA Fib PSW Fasc Other Amp Dur. Poly Pattern  R. Deltoid Normal None None None _______ Normal Normal 1+ Reduced  R. Triceps brachii Normal None None None  _______ Normal Normal 1+ Normal  R. Biceps brachii Normal None None None _______ Increased Increased 1+ Reduced  R. Extensor digitorum communis Normal None None None _______ Normal Normal 1+ Normal  R. First dorsal interosseous Normal None None None _______ Normal Increased 1+ Normal  R. Flexor carpi ulnaris Normal None None None _______ Normal Normal 1+ Normal  R. Vastus medialis Normal None None None _______ Normal Normal Normal Normal  R. Tibialis anterior Normal None None None _______ Normal Normal 1+ Normal  R. Peroneus longus Normal None None None _______ Normal Normal 1+ Reduced  R. Gastrocnemius (Medial head) Normal None  None None _______ Normal Normal 1+ Reduced  R. Abductor hallucis Normal None None None _______ Normal Normal Normal Normal  R. Mentalis Normal None None None _______ reduced Normal 1+ Normal  R. Genioglossus Normal None None None _______ Normal Normal Normal Normal  R. Orbicularis oris Normal None None None _______ Normal Normal Normal Normal  L. Vastus medialis Normal None None None _______ Normal Normal 1+ Normal  L. Tibialis anterior Normal None None None _______ Normal Normal Normal Normal  L. Peroneus longus Normal None None None _______ Normal Increased 1+ Reduced  L. Gastrocnemi  [place] (Medial head) Normal None None None _______ Normal Normal 1+ Reduced  L. Abductor hallucis Normal None None None _______ Normal Increased 1+ Reduced  L. Dorsal interossei (pedis) Normal None None None _______ Normal Normal Normal Normal  L. Extensor digitorum brevis Normal None None None _______ Increased Increased 1+ Reduced

## 2019-05-04 NOTE — Progress Notes (Signed)
[facility]  PATIENT: Derrick White DOB: 03/21/1941  REFERRING DOCTOR OR PCP:  Domenick Gong SOURCE: patient, records in EPIC from [facility], MRI results, EMG/NCV results  _________________________________   HISTORICAL  CHIEF COMPLAINT:  No chief complaint on file.   HISTORY OF PRESENT ILLNESS:  Derrick White is a 79 yo man with left ankle and foot pain.    Update 05/04/2019: His slurred speech continues to worsen.  There has also been some difficulty with swallowing over the last few months.  NCV/EMG study did not show any evidence of motor neuron disease.  One facial muscle had smaller than typical motor units but recruitment was normal and 2 other facial muscles appear normal.  He does not have any difficulties with diplopia or ptosis.  MRI did not show any source of these bulbar symptoms.  He continues to have foot pain, left greater than right.  He has not been able to tolerate multiple different oral agents or did not have benefit that was sustained (nortriptyline, oxcarbazepine, lamotrigine, gabapentin).  Topical lidocaine has not helped much.  He does note some difficulties with his gait but this is not progressive over the past year.  He has had hemilaminectomy at L5-S1 and also broke his left ankle in the past with a nerve injury.  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  NCV/EMG study was performed and showed the following: Impression: 1.   Chronic right C6 radiculopathy.  There could also be milder C5 and C7 chronic radiculopathies. 2.   Chronic left S1 radiculopathy. 3.   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   There was no evidence of polyneuropathy, motor neuron disease or myopathy.   Update 03/16/19: He is off all of his medications for the foot pain.   He has numbness and it becomes painful.   We tried  several medications.  The nortriptyline, oxcarbazepine and lamotrigine had not helped much.  Lidocaine ointment did not help.  He does not note much trouble with cognition though he has trouble coming up with names at times.   He feels his abilities to balance a checkbook and focus are about the same.     He has had slurred speech x 1 year and it has progressed.   He has no trouble with eye movements.   He trouble with swallowing but his daughter sent a message that he was having some issues with this.Marland Kitchen  His handwriting is worse.  He feels his gait has worsened somewhat over the last year and he stumbles at times.  An MRI of the brain had shown atrophy and chronic microvascular ischemic change.  The extent of ischemic change in the pons was mild.  There was no brainstem atrophy.  Update 06/15/18: He feels the foot is doing about the same as last visit.     Numbness is in the left foot up to above the ankles.   He has constant discomfort with some superimposed sharp pains and allodynia.    He takes nortriptyline 20 mg nightly.   He slurs his speech some.   Oxcarbazepine and lamotrigine had both helped initially but then seemed not to. Help as much.    Lidocaine ointment did not help.   He notes more trouble with slurred speech.  He had reported memory issues last visit.  He has trouble with names.   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    Update 12/12/2017: He continues to have numbness and allodynia in the left foot.  At the last visit the dose of Trileptal was increased to 300 mg po bid but it made him dizzy and his voice slurred and he went back down to 1/2 pill bid.  The dizziness is much better but his voice is still slurred.    At the last visit, he said oxcarbazepine was helping more than the lamotrigine so he stopped the lamotrigine.  The previous visit, he said the lamotrigine was helping about 60%, however.   Lyrica was not tolerated.      He also uses lidocaine  ointment without much benefit --- helps a bit but quickly wears off.  He has had many podiatric procedures on the left  He notes the slurred speech fluctuates and he has 5-10 minutes of severe slurred speech once a day, but better the last couple weeks.,  He had an MRI June 2019.   I personally reviewed the MRI of the brain.  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  There were no acute findings.  I asked about his short-term memory.  He feels his short-term memory is doing about the same but he notes that his wife sometimes tells him that he never remembers things.    Mood is doing well.  He feels he sleeps well at night.    Update 09/15/2017: He still has some shooting pain in the top of the feet into the toes.   He has dysesthetic pain with allodynia and hyperpathia, worse in the peroneal more than sural distribution.     He feels they probably occur less frequently on the medications.  He is currently 3/4 of a trileptal bid.    He stopped lamotrigine as oxcarbazepine works better.     He occasionally uses lidocaine.   Hot water seems to help for a while.     He has had some operations in his feet to see if the nerves being clipped would help but it has not.     Update 03/17/2017:   He noted some improvement with lamotrigine initially but a higher dose did not help any more.     The pain is in the feet, worse in the toes and milder at the ankles.    There is allodynia as well as tingling pain.   He feels mildly weak in his feet,    Lyrica was not tolerated.   He has not tried Trileptal.     From 08/08/2016:   He is much better on lamotrigine and is on 50 mg po bid.   He feels he is 60% better.  He still has some pain on the dorsum between the first and second toes but this is much better. He now does not note allodynia with light touch. He has just slight pain with pressure  between the first and second, second and third toes.   Pain had been worse old and slightly better with heat but he notes less of the fluctuations now.. Pain is better when he has his feet up.  He feels the current intensity of pain is tolerable.  History:   He broke his left ankle in 2012 or 2013 requiring plate and screws.   He later had the hardware removed without benefit.  A year leater, he had surgery to decompress the peroneal nerve.    He was referred to [facility] and had the left peroneal neurectomy and removal of  L3L4 Morton's Neuroma and L2L3 intermetatarsal nerve decompression procedures.    He was tried on Lyrica but felt very off balanced and mentally slowed so he stopped.  He had mild benefit from lidocaine containing ointments. A couple different types of ointments were tried and the composition of all of them is unknown.     A nerve conduction study performed 05/09/2016 at [facility] showed an absent left superficial peroneal sensory response (was present on the right). Sural sensory response was normal. Left peroneal motor response was normal.  I have reviewed the MRI of the cervical spine dated 01/10/2009. 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 There is multilevel foraminal narrowing, worse to the right at C5-C6 and C6-C7 where there is possible nerve root compression.   Subsequently, a few months later,  he had cervical fusion.    REVIEW OF SYSTEMS: Constitutional: No fevers, chills, sweats, or change in appetite Eyes: No visual changes, double vision, eye pain Ear, nose and throat: No hearing loss, ear pain, nasal congestion, sore throat Cardiovascular: No chest pain, palpitations Respiratory: No shortness of breath at rest or with exertion.   No wheezes GastrointestinaI: No nausea, vomiting, diarrhea, abdominal pain, fecal incontinence.    Has GERD Genitourinary: No dysuria, urinary retention or frequency.  No nocturia. Musculoskeletal: No current neck pain, back pain Integumentary: No rash, pruritus, skin lesions Neurological: as above Psychiatric: No depression at this time.  No anxiety Endocrine: No palpitations, diaphoresis, change in appetite, change in weigh or increased thirst Hematologic/Lymphatic: No anemia, purpura, petechiae. Allergic/Immunologic: No itchy/runny eyes, nasal congestion, recent allergic reactions, rashes  ALLERGIES: Allergies  Allergen Reactions  . Morphine Hives and Itching  . Codeine Hives and Itching  . Propoxyphene Itching    DARVOCET  . Shrimp [Shellfish Allergy] Rash    HOME MEDICATIONS:  Current Outpatient Medications:  .  acetaminophen (TYLENOL) 500 MG tablet, Take 500 mg by mouth every 6 (six) hours as needed for moderate pain or headache., Disp: , Rfl:  .  Cholecalciferol (VITAMIN D-1000 MAX ST) 1000 units tablet, Take 1,000 Units by mouth 3 (three) times a week. , Disp: , Rfl:  .  ibuprofen (ADVIL) 200 MG tablet, Take 200 mg by mouth every 6 (six) hours as needed for headache or moderate pain., Disp: , Rfl:  .  pantoprazole (PROTONIX) 40 MG tablet, Take 40 mg by mouth daily. , Disp: , Rfl: 2 .  pravastatin (PRAVACHOL) 40 MG tablet, Take 40 mg by mouth at bedtime. , Disp: , Rfl: 2 .  Propylene Glycol (SYSTANE BALANCE) 0.6 % SOLN, Place 1 drop into both eyes daily as needed (burning)., Disp: , Rfl:  .  telmisartan (MICARDIS) 80 MG tablet, Take 80 mg by mouth daily. , Disp: , Rfl:   PAST MEDICAL HISTORY: Past Medical History:  Diagnosis Date  . History of hiatal hernia   . History of kidney stones   . Hypertension     PAST SURGICAL HISTORY: See above  FAMILY HISTORY: No family history on file.  SOCIAL HISTORY:  Social History   Socioeconomic History  . Marital status: Married    Spouse name: Not on file  . Number of children: Not on file  . Years  of education:  Not on file  . Highest education level: Not on file  Occupational History  . Not on file  Tobacco Use  . Smoking status: Former Smoker    Types: Cigarettes    Quit date: 1995    Years since quitting: 25.9  . Smokeless tobacco: Never Used  Substance and Sexual Activity  . Alcohol use: Not Currently    Alcohol/week: 7.0 standard drinks    Types: 7 Cans of beer per week  . Drug use: Not Currently  . Sexual activity: Not on file  Other Topics Concern  . Not on file  Social History Narrative  . Not on file   Social Determinants of Health   Financial Resource Strain:   . Difficulty of Paying Living Expenses: Not on file  Food Insecurity:   . Worried About Charity fundraiser in the Last Year: Not on file  . Ran Out of Food in the Last Year: Not on file  Transportation Needs:   . Lack of Transportation (Medical): Not on file  . Lack of Transportation (Non-Medical): Not on file  Physical Activity:   . Days of Exercise per Week: Not on file  . Minutes of Exercise per Session: Not on file  Stress:   . Feeling of Stress : Not on file  Social Connections:   . Frequency of Communication with Friends and Family: Not on file  . Frequency of Social Gatherings with Friends and Family: Not on file  . Attends Religious Services: Not on file  . Active Member of Clubs or Organizations: Not on file  . Attends Archivist Meetings: Not on file  . Marital Status: Not on file  Intimate Partner Violence:   . Fear of Current or Ex-Partner: Not on file  . Emotionally Abused: Not on file  . Physically Abused: Not on file  . Sexually Abused: Not on file     PHYSICAL EXAM  There were no vitals filed for this visit.  There is no height or weight on file to calculate BMI.   General: The patient is well-developed and well-nourished and in no acute distress    Skin/Ext: Extremities are without rash or edema.   He has well-healed surgical scars on the left foot.  These are near the  sural nerve.    Neurologic Exam  Mental status: He appears to have reduced focus and attention.  Has no trouble following commands.   No aphasia  Cranial nerves: Extraocular movements are full.  Facial strength is normal.  Palatal elevation and tongue protrusion are midline.   No obvious hearing deficits are noted.  Motor:  Muscle bulk is normal.   Tone is normal. Strength is  5/5 in legs/feet including EHL   Sensory:   He has mildly reduced sensation with allodynia in the left foot, in the distribution of the left superficial peroneal nerve he also had mildly reduced vibration sensation at the left great toe.    Sensation in the right foot is fairly normal  Coordination: Cerebellar testing reveals good finger-nose-finger good heel-to-shin bilaterally.  Gait and station: Station is normal.   Gait is mildly wide but he turns in 3 steps.    Romberg is negative.   Reflexes: Deep tendon reflexes are symmetric and 3 at knees and 1 at ankles.       DIAGNOSTIC DATA (LABS, IMAGING, TESTING) - I reviewed patient records, labs, notes, testing and imaging myself where available.  Lab Results  Component Value  Date   WBC 7.5 02/26/2019   HGB 15.4 02/26/2019   HCT 44.9 02/26/2019   MCV 97.2 02/26/2019   PLT 305 02/26/2019      Component Value Date/Time   NA 139 02/26/2019 1336   K 3.8 02/26/2019 1336   CL 105 02/26/2019 1336   CO2 24 02/26/2019 1336   GLUCOSE 122 (H) 02/26/2019 1336   BUN 14 02/26/2019 1336   CREATININE 1.00 02/26/2019 1336   CALCIUM 9.4 02/26/2019 1336   PROT 7.0 04/24/2009 1036   ALBUMIN 4.4 04/24/2009 1036   AST 21 04/24/2009 1036   ALT 23 04/24/2009 1036   ALKPHOS 30 (L) 04/24/2009 1036   BILITOT 0.7 04/24/2009 1036   GFRNONAA >60 02/26/2019 1336   GFRAA >60 02/26/2019 1336       ASSESSMENT AND PLAN  Dysesthesia  Slurred speech  Mild cognitive impairment  Superficial peroneal nerve neuropathy, left  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   The etiology of his swallowing difficulties is unclear.  He did not appear to have a motor neuron disease on EMG.  We will check some lab work for myopathy and myasthenia gravis.  He did not have parkinsonian features.  MRI shows some chronic microvascular ischemic changes but nothing that would be expected to significantly affect speech or swallowing.  We discussed a second opinion if lab work did not identify a source. 2.   He will try some of his wife's tramadol (she no longer uses) for a few days to see if the foot pain is better.  If so, any tolerated, I be happy to write a prescription 3.   Cognition appears fairly stable.  We did discuss that I cannot rule out an early stage of a neurodegenerative process and memory could worsen. 4.    He will return to see me in 6 months or sooner if there are new or worsening neurologic symptoms.Marland Kitchen He should call if he has worsening pain or other new symptoms.   Bertil Brickey A. Felecia Shelling, MD, PhD [id number], [id number] PM Certified in Neurology, Clinical Neurophysiology, Sleep Medicine, Pain Medicine and Neuroimaging  [facility] [address], [place], [place] [address] [phone]

## 2019-05-10 ENCOUNTER — Telehealth: Payer: Self-pay | Admitting: *Deleted

## 2019-05-10 NOTE — Telephone Encounter (Signed)
-----   Message from Penni Bombard, MD sent at 05/10/2019 10:22 AM EST ----- Labs ok. GM1 ab pending. -VRP

## 2019-05-10 NOTE — Telephone Encounter (Signed)
Called and spoke with wife about lab results. Advised so far, everything that came back is ok. One lab still pending. She verbalized understanding.

## 2019-05-13 LAB — GM1 IGG AUTOANTIBODIES: GM1 IgG Autoantibodies: <20 % (ref 0–30)

## 2019-05-13 LAB — ACETYLCHOLINE RECEPTOR, BLOCKING: Acetylchol Block Ab: 21 % (ref 0–25)

## 2019-05-13 LAB — ACETYLCHOLINE RECEPTOR, BINDING: AChR Binding Ab, Serum: <0.03 nmol/L (ref 0.00–0.24)

## 2019-05-13 LAB — ACETYLCHOLINE RECEPTOR, MODULATING: Acetylcholine Modulat Ab: <12 % (ref 0–20)

## 2019-05-13 LAB — CK: Total CK: 114 U/L (ref 41–331)

## 2019-05-13 LAB — INTERPRETATION

## 2019-05-17 ENCOUNTER — Telehealth: Payer: Self-pay | Admitting: *Deleted

## 2019-05-17 DIAGNOSIS — R208 Other disturbances of skin sensation: Secondary | ICD-10-CM

## 2019-05-17 DIAGNOSIS — R4781 Slurred speech: Secondary | ICD-10-CM

## 2019-05-17 DIAGNOSIS — M6281 Muscle weakness (generalized): Secondary | ICD-10-CM

## 2019-05-17 DIAGNOSIS — R269 Unspecified abnormalities of gait and mobility: Secondary | ICD-10-CM

## 2019-05-17 NOTE — Telephone Encounter (Signed)
-----   Message from Britt Bottom, MD sent at 05/17/2019  2:15 PM EST ----- Please let him know that the rest of the lab work was also normal. I do not have an explanation for his worsening slurred speech.  I would be happy to refer him to one of the academic centers if he would like a second opinion (we had discussed this when I last saw him a couple weeks ago)

## 2019-05-17 NOTE — Telephone Encounter (Signed)
Placed referral  

## 2019-05-17 NOTE — Telephone Encounter (Signed)
Called and spoke with pt wife, [name] (on [place]). Relayed results per Dr. Felecia Shelling note. She would like referral to [facility] if ok with Dr. Felecia Shelling.

## 2019-05-17 NOTE — Telephone Encounter (Signed)
We could refer to one of the neuromuscular doctors (progressive slurred speech and dysphagia)

## 2019-05-31 ENCOUNTER — Ambulatory Visit: Payer: Medicare Other | Attending: Internal Medicine

## 2019-05-31 DIAGNOSIS — Z23 Encounter for immunization: Secondary | ICD-10-CM | POA: Diagnosis not present

## 2019-05-31 NOTE — Progress Notes (Signed)
   Covid-19 Vaccination Clinic  Name:  Derrick White    MRN: [id number] DOB: [birth year]  05/31/2019  Mr. Cugini was observed post Covid-19 immunization for 30 minutes based on pre-vaccination screening without incidence. He was provided with Vaccine Information Sheet and instruction to access the V-Safe system.   Mr. Meuth was instructed to call 911 with any severe reactions post vaccine: Marland Kitchen Difficulty breathing  . Swelling of your face and throat  . A fast heartbeat  . A bad rash all over your body  . Dizziness and weakness    Immunizations Administered    Name Date Dose VIS Date Route   Pfizer COVID-19 Vaccine 05/31/2019 11:41 AM 0.3 mL 04/30/2019 Intramuscular   Manufacturer: Pearl   Lot: [id number]   [place]: [id number]

## 2019-06-04 ENCOUNTER — Telehealth: Payer: Self-pay | Admitting: Neurology

## 2019-06-04 NOTE — Telephone Encounter (Signed)
Pt's wife called stating that [facility] has not called them yet to schedule the pt. Please advise.

## 2019-06-07 NOTE — Telephone Encounter (Signed)
Hey, can one of you f/u on this referral that was placed 05/17/19? Thank you!

## 2019-06-08 NOTE — Telephone Encounter (Signed)
Re-faxed referral. Received confirmation.

## 2019-06-08 NOTE — Telephone Encounter (Signed)
Derrick White can refax referral please to [phone] - [facility]   I have called and left patients wife apt date for 04/18/2020 arrive at 7:30 for 8:00 apt . Patient is on CX list.

## 2019-06-09 DIAGNOSIS — L111 Transient acantholytic dermatosis [Grover]: Secondary | ICD-10-CM | POA: Diagnosis not present

## 2019-06-09 DIAGNOSIS — L245 Irritant contact dermatitis due to other chemical products: Secondary | ICD-10-CM | POA: Diagnosis not present

## 2019-06-15 ENCOUNTER — Ambulatory Visit: Payer: Medicare Other | Admitting: Orthopaedic Surgery

## 2019-06-18 ENCOUNTER — Ambulatory Visit: Payer: Medicare Other

## 2019-06-20 ENCOUNTER — Ambulatory Visit: Payer: Medicare Other | Attending: Internal Medicine

## 2019-06-20 DIAGNOSIS — Z23 Encounter for immunization: Secondary | ICD-10-CM | POA: Insufficient documentation

## 2019-06-20 NOTE — Progress Notes (Signed)
   Covid-19 Vaccination Clinic  Name:  DVAUGHN GWYN    MRN: [id number] DOB: 01/24/1941  06/20/2019  Mr. Reichl was observed post Covid-19 immunization for 15 minutes without incidence. He was provided with Vaccine Information Sheet and instruction to access the V-Safe system.   Mr. Fagot was instructed to call 911 with any severe reactions post vaccine: Marland Kitchen Difficulty breathing  . Swelling of your face and throat  . A fast heartbeat  . A bad rash all over your body  . Dizziness and weakness    Immunizations Administered    Name Date Dose VIS Date Route   Pfizer COVID-19 Vaccine 06/20/2019 10:42 AM 0.3 mL 04/30/2019 Intramuscular   Manufacturer: [place]   Lot: [id number]   [place]: [id number]

## 2019-08-02 DIAGNOSIS — G4700 Insomnia, unspecified: Secondary | ICD-10-CM | POA: Diagnosis not present

## 2019-08-02 DIAGNOSIS — I129 Hypertensive chronic kidney disease with stage 1 through stage 4 chronic kidney disease, or unspecified chronic kidney disease: Secondary | ICD-10-CM | POA: Diagnosis not present

## 2019-08-02 DIAGNOSIS — E7800 Pure hypercholesterolemia, unspecified: Secondary | ICD-10-CM | POA: Diagnosis not present

## 2019-08-02 DIAGNOSIS — G9050 Complex regional pain syndrome I, unspecified: Secondary | ICD-10-CM | POA: Diagnosis not present

## 2019-08-02 DIAGNOSIS — G3184 Mild cognitive impairment, so stated: Secondary | ICD-10-CM | POA: Diagnosis not present

## 2019-08-02 DIAGNOSIS — M1990 Unspecified osteoarthritis, unspecified site: Secondary | ICD-10-CM | POA: Diagnosis not present

## 2019-08-02 DIAGNOSIS — Z1331 Encounter for screening for depression: Secondary | ICD-10-CM | POA: Diagnosis not present

## 2019-08-02 DIAGNOSIS — N182 Chronic kidney disease, stage 2 (mild): Secondary | ICD-10-CM | POA: Diagnosis not present

## 2019-08-02 DIAGNOSIS — K219 Gastro-esophageal reflux disease without esophagitis: Secondary | ICD-10-CM | POA: Diagnosis not present

## 2019-08-02 DIAGNOSIS — R809 Proteinuria, unspecified: Secondary | ICD-10-CM | POA: Diagnosis not present

## 2019-08-02 DIAGNOSIS — N401 Enlarged prostate with lower urinary tract symptoms: Secondary | ICD-10-CM | POA: Diagnosis not present

## 2019-08-10 DIAGNOSIS — H5201 Hypermetropia, right eye: Secondary | ICD-10-CM | POA: Diagnosis not present

## 2019-08-10 DIAGNOSIS — H524 Presbyopia: Secondary | ICD-10-CM | POA: Diagnosis not present

## 2019-08-10 DIAGNOSIS — H2513 Age-related nuclear cataract, bilateral: Secondary | ICD-10-CM | POA: Diagnosis not present

## 2019-08-10 DIAGNOSIS — H25011 Cortical age-related cataract, right eye: Secondary | ICD-10-CM | POA: Diagnosis not present

## 2019-08-25 ENCOUNTER — Ambulatory Visit: Payer: Medicare Other | Admitting: Podiatry

## 2019-09-14 DIAGNOSIS — L570 Actinic keratosis: Secondary | ICD-10-CM | POA: Diagnosis not present

## 2019-09-14 DIAGNOSIS — L814 Other melanin hyperpigmentation: Secondary | ICD-10-CM | POA: Diagnosis not present

## 2019-09-14 DIAGNOSIS — L821 Other seborrheic keratosis: Secondary | ICD-10-CM | POA: Diagnosis not present

## 2019-09-15 ENCOUNTER — Ambulatory Visit: Payer: Medicare Other | Admitting: Neurology

## 2019-09-16 DIAGNOSIS — G119 Hereditary ataxia, unspecified: Secondary | ICD-10-CM | POA: Diagnosis not present

## 2019-09-16 DIAGNOSIS — R471 Dysarthria and anarthria: Secondary | ICD-10-CM | POA: Diagnosis not present

## 2019-09-16 DIAGNOSIS — Z885 Allergy status to narcotic agent status: Secondary | ICD-10-CM | POA: Diagnosis not present

## 2019-09-16 DIAGNOSIS — R270 Ataxia, unspecified: Secondary | ICD-10-CM | POA: Diagnosis not present

## 2019-09-16 DIAGNOSIS — R1312 Dysphagia, oropharyngeal phase: Secondary | ICD-10-CM | POA: Diagnosis not present

## 2019-09-17 ENCOUNTER — Other Ambulatory Visit: Payer: Self-pay | Admitting: Psychiatry

## 2019-09-17 DIAGNOSIS — R270 Ataxia, unspecified: Secondary | ICD-10-CM

## 2019-09-21 ENCOUNTER — Other Ambulatory Visit: Payer: Self-pay | Admitting: Internal Medicine

## 2019-09-21 DIAGNOSIS — R05 Cough: Secondary | ICD-10-CM

## 2019-09-21 DIAGNOSIS — R059 Cough, unspecified: Secondary | ICD-10-CM

## 2019-09-28 ENCOUNTER — Ambulatory Visit
Admission: RE | Admit: 2019-09-28 | Discharge: 2019-09-28 | Disposition: A | Discharge status: Home / Self Care | Payer: Medicare Other | Source: Ambulatory Visit | Attending: Internal Medicine | Admitting: Internal Medicine

## 2019-09-28 DIAGNOSIS — K219 Gastro-esophageal reflux disease without esophagitis: Secondary | ICD-10-CM | POA: Diagnosis not present

## 2019-09-28 DIAGNOSIS — R05 Cough: Secondary | ICD-10-CM

## 2019-09-28 DIAGNOSIS — R059 Cough, unspecified: Secondary | ICD-10-CM

## 2019-09-28 DIAGNOSIS — K224 Dyskinesia of esophagus: Secondary | ICD-10-CM | POA: Diagnosis not present

## 2019-09-29 ENCOUNTER — Ambulatory Visit
Admission: RE | Admit: 2019-09-29 | Discharge: 2019-09-29 | Disposition: A | Discharge status: Home / Self Care | Payer: Medicare Other | Source: Ambulatory Visit | Attending: Gastroenterology | Admitting: Gastroenterology

## 2019-09-29 ENCOUNTER — Other Ambulatory Visit: Payer: Self-pay | Admitting: Gastroenterology

## 2019-09-29 DIAGNOSIS — Z86010 Personal history of colonic polyps: Secondary | ICD-10-CM | POA: Diagnosis not present

## 2019-09-29 DIAGNOSIS — K5730 Diverticulosis of large intestine without perforation or abscess without bleeding: Secondary | ICD-10-CM | POA: Diagnosis not present

## 2019-09-29 DIAGNOSIS — K529 Noninfective gastroenteritis and colitis, unspecified: Secondary | ICD-10-CM

## 2019-10-14 ENCOUNTER — Ambulatory Visit
Admission: RE | Admit: 2019-10-14 | Discharge: 2019-10-14 | Disposition: A | Discharge status: Home / Self Care | Payer: Medicare Other | Source: Ambulatory Visit | Attending: Psychiatry | Admitting: Psychiatry

## 2019-10-14 ENCOUNTER — Other Ambulatory Visit: Payer: Self-pay

## 2019-10-14 DIAGNOSIS — R270 Ataxia, unspecified: Secondary | ICD-10-CM

## 2019-11-02 ENCOUNTER — Ambulatory Visit (INDEPENDENT_AMBULATORY_CARE_PROVIDER_SITE_OTHER): Payer: Medicare Other | Admitting: Podiatry

## 2019-11-02 ENCOUNTER — Other Ambulatory Visit: Payer: Self-pay

## 2019-11-02 ENCOUNTER — Encounter: Payer: Self-pay | Admitting: Podiatry

## 2019-11-02 DIAGNOSIS — M79674 Pain in right toe(s): Secondary | ICD-10-CM | POA: Diagnosis not present

## 2019-11-02 DIAGNOSIS — B351 Tinea unguium: Secondary | ICD-10-CM

## 2019-11-02 DIAGNOSIS — M79675 Pain in left toe(s): Secondary | ICD-10-CM | POA: Diagnosis not present

## 2019-11-02 NOTE — Progress Notes (Signed)
Complaint:  Visit Type: Patient presents  to my office for  preventative foot care services. Complaint: Patient states" my nails have grown long and thick and become painful to walk and wear shoes" Patient has been diagnosed with DM with superficial peroneal nerve neuropathy. The patient presents for preventative foot care services.  Podiatric Exam: Vascular: dorsalis pedis and posterior tibial pulses are palpable bilateral. Capillary return is immediate. Temperature gradient is WNL. Skin turgor WNL  Sensorium: Normal Semmes Weinstein monofilament test right foot.  LOPS diminished left foot.. Normal tactile sensation bilaterally. Nail Exam: Pt has thick disfigured discolored nails with subungual debris noted hallux  B/L. Ulcer Exam: There is no evidence of ulcer or pre-ulcerative changes or infection. Orthopedic Exam: Muscle tone and strength are WNL. No limitations in general ROM. No crepitus or effusions noted. Foot type and digits show no abnormalities. HAV  B/L.  Hammer toe 2  B/L Skin: No Porokeratosis. No infection or ulcers  Diagnosis:  Onychomycosis, , Pain in right toe, pain in left toes  Treatment & Plan Procedures and Treatment: Consent by patient was obtained for treatment procedures.   Debridement of mycotic and hypertrophic toenails, 1 through 5 bilateral and clearing of subungual debris. No ulceration, no infection noted.  Return Visit-Office Procedure: Patient instructed to return to the office for a follow up visit 4  months for continued evaluation and treatment.    Gardiner Barefoot DPM

## 2019-11-05 ENCOUNTER — Encounter (HOSPITAL_COMMUNITY): Payer: Self-pay | Admitting: Pediatrics

## 2019-11-05 ENCOUNTER — Other Ambulatory Visit: Payer: Self-pay

## 2019-11-05 ENCOUNTER — Observation Stay (HOSPITAL_COMMUNITY): Payer: Medicare Other

## 2019-11-05 ENCOUNTER — Emergency Department (HOSPITAL_COMMUNITY): Payer: Medicare Other

## 2019-11-05 ENCOUNTER — Observation Stay (HOSPITAL_COMMUNITY)
Admission: EM | Admit: 2019-11-05 | Discharge: 2019-11-06 | Disposition: A | Discharge status: Home / Self Care | Payer: Medicare Other | Attending: Internal Medicine | Admitting: Internal Medicine

## 2019-11-05 ENCOUNTER — Observation Stay (HOSPITAL_BASED_OUTPATIENT_CLINIC_OR_DEPARTMENT_OTHER): Payer: Medicare Other

## 2019-11-05 DIAGNOSIS — G459 Transient cerebral ischemic attack, unspecified: Secondary | ICD-10-CM

## 2019-11-05 DIAGNOSIS — R4701 Aphasia: Secondary | ICD-10-CM

## 2019-11-05 DIAGNOSIS — Z20822 Contact with and (suspected) exposure to covid-19: Secondary | ICD-10-CM | POA: Insufficient documentation

## 2019-11-05 DIAGNOSIS — Z79899 Other long term (current) drug therapy: Secondary | ICD-10-CM | POA: Insufficient documentation

## 2019-11-05 DIAGNOSIS — G119 Hereditary ataxia, unspecified: Secondary | ICD-10-CM | POA: Diagnosis not present

## 2019-11-05 DIAGNOSIS — Z87891 Personal history of nicotine dependence: Secondary | ICD-10-CM | POA: Diagnosis not present

## 2019-11-05 DIAGNOSIS — I639 Cerebral infarction, unspecified: Secondary | ICD-10-CM

## 2019-11-05 DIAGNOSIS — G118 Other hereditary ataxias: Secondary | ICD-10-CM

## 2019-11-05 DIAGNOSIS — I10 Essential (primary) hypertension: Secondary | ICD-10-CM | POA: Diagnosis not present

## 2019-11-05 DIAGNOSIS — R4781 Slurred speech: Secondary | ICD-10-CM | POA: Diagnosis present

## 2019-11-05 HISTORY — DX: Essential (primary) hypertension: I10

## 2019-11-05 HISTORY — DX: Transient cerebral ischemic attack, unspecified: G45.9

## 2019-11-05 HISTORY — DX: Hereditary ataxia, unspecified: G11.9

## 2019-11-05 LAB — LIPID PANEL
Cholesterol: 165 mg/dL (ref 0–200)
HDL: 51 mg/dL (ref >40)
LDL Cholesterol: 82 mg/dL (ref 0–99)
Total CHOL/HDL Ratio: 3.2 RATIO
Triglycerides: 161 mg/dL — ABNORMAL HIGH (ref <150)
VLDL: 32 mg/dL (ref 0–40)

## 2019-11-05 LAB — COMPREHENSIVE METABOLIC PANEL
ALT: 20 U/L (ref 0–44)
AST: 18 U/L (ref 15–41)
Albumin: 4.1 g/dL (ref 3.5–5.0)
Alkaline Phosphatase: 27 U/L — ABNORMAL LOW (ref 38–126)
Anion gap: 10 (ref 5–15)
BUN: 18 mg/dL (ref 8–23)
CO2: 22 mmol/L (ref 22–32)
Calcium: 9.2 mg/dL (ref 8.9–10.3)
Chloride: 107 mmol/L (ref 98–111)
Creatinine, Ser: 1.17 mg/dL (ref 0.61–1.24)
GFR calc Af Amer: >60 mL/min (ref >60)
GFR calc non Af Amer: 59 mL/min — ABNORMAL LOW (ref >60)
Glucose, Bld: 96 mg/dL (ref 70–99)
Potassium: 4.1 mmol/L (ref 3.5–5.1)
Sodium: 139 mmol/L (ref 135–145)
Total Bilirubin: 1.1 mg/dL (ref 0.3–1.2)
Total Protein: 6.9 g/dL (ref 6.5–8.1)

## 2019-11-05 LAB — DIFFERENTIAL
Abs Immature Granulocytes: 0.03 10*3/uL (ref 0.00–0.07)
Basophils Absolute: 0 10*3/uL (ref 0.0–0.1)
Basophils Relative: 1 %
Eosinophils Absolute: 0.1 10*3/uL (ref 0.0–0.5)
Eosinophils Relative: 2 %
Immature Granulocytes: 0 %
Lymphocytes Relative: 39 %
Lymphs Abs: 2.6 10*3/uL (ref 0.7–4.0)
Monocytes Absolute: 0.7 10*3/uL (ref 0.1–1.0)
Monocytes Relative: 11 %
Neutro Abs: 3.3 10*3/uL (ref 1.7–7.7)
Neutrophils Relative %: 47 %

## 2019-11-05 LAB — CBC
HCT: 43.4 % (ref 39.0–52.0)
Hemoglobin: 14.2 g/dL (ref 13.0–17.0)
MCH: 32.1 pg (ref 26.0–34.0)
MCHC: 32.7 g/dL (ref 30.0–36.0)
MCV: 98 fL (ref 80.0–100.0)
Platelets: 372 10*3/uL (ref 150–400)
RBC: 4.43 MIL/uL (ref 4.22–5.81)
RDW: 11.6 % (ref 11.5–15.5)
WBC: 6.7 10*3/uL (ref 4.0–10.5)
nRBC: 0 % (ref 0.0–0.2)

## 2019-11-05 LAB — I-STAT CHEM 8, ED
BUN: 20 mg/dL (ref 8–23)
Calcium, Ion: 1.27 mmol/L (ref 1.15–1.40)
Chloride: 105 mmol/L (ref 98–111)
Creatinine, Ser: 1.1 mg/dL (ref 0.61–1.24)
Glucose, Bld: 99 mg/dL (ref 70–99)
HCT: 42 % (ref 39.0–52.0)
Hemoglobin: 14.3 g/dL (ref 13.0–17.0)
Potassium: 4.1 mmol/L (ref 3.5–5.1)
Sodium: 141 mmol/L (ref 135–145)
TCO2: 22 mmol/L (ref 22–32)

## 2019-11-05 LAB — PROTIME-INR
INR: 1.1 (ref 0.8–1.2)
Prothrombin Time: 13.6 seconds (ref 11.4–15.2)

## 2019-11-05 LAB — ECHOCARDIOGRAM COMPLETE
Height: 70 in
Weight: 2880 oz

## 2019-11-05 LAB — SARS CORONAVIRUS 2 BY RT PCR (HOSPITAL ORDER, PERFORMED IN ~~LOC~~ HOSPITAL LAB): SARS Coronavirus 2: NEGATIVE

## 2019-11-05 LAB — TSH: TSH: 2.024 u[IU]/mL (ref 0.350–4.500)

## 2019-11-05 LAB — CBG MONITORING, ED: Glucose-Capillary: 92 mg/dL (ref 70–99)

## 2019-11-05 LAB — APTT: aPTT: 30 seconds (ref 24–36)

## 2019-11-05 MED ORDER — STROKE: EARLY STAGES OF RECOVERY BOOK
Freq: Once | Status: AC
  Filled 2019-11-05: qty 1

## 2019-11-05 MED ORDER — ACETAMINOPHEN 650 MG RE SUPP: 650.0000 mg | RECTAL | Status: DC | PRN

## 2019-11-05 MED ORDER — ATORVASTATIN CALCIUM 40 MG PO TABS
40.0000 mg | ORAL_TABLET | Freq: Every day | ORAL | Status: DC
  Administered 2019-11-05 – 2019-11-06 (×2): 40 mg via ORAL
  Filled 2019-11-05 (×2): qty 1

## 2019-11-05 MED ORDER — SODIUM CHLORIDE 0.9% FLUSH
3.0000 mL | Freq: Once | INTRAVENOUS | Status: AC
  Administered 2019-11-05: 3 mL via INTRAVENOUS

## 2019-11-05 MED ORDER — ACETAMINOPHEN 325 MG PO TABS: 650.0000 mg | ORAL_TABLET | ORAL | Status: DC | PRN

## 2019-11-05 MED ORDER — PANTOPRAZOLE SODIUM 40 MG PO TBEC
40.0000 mg | DELAYED_RELEASE_TABLET | Freq: Every day | ORAL | Status: DC
  Administered 2019-11-06: 40 mg via ORAL
  Filled 2019-11-05: qty 1

## 2019-11-05 MED ORDER — POLYVINYL ALCOHOL 1.4 % OP SOLN
1.0000 [drp] | Freq: Every day | OPHTHALMIC | Status: DC | PRN
  Filled 2019-11-05: qty 15

## 2019-11-05 MED ORDER — SENNOSIDES-DOCUSATE SODIUM 8.6-50 MG PO TABS: 1.0000 | ORAL_TABLET | Freq: Every evening | ORAL | Status: DC | PRN

## 2019-11-05 MED ORDER — ASPIRIN 325 MG PO TABS
325.0000 mg | ORAL_TABLET | Freq: Once | ORAL | Status: AC
  Administered 2019-11-05: 325 mg via ORAL
  Filled 2019-11-05: qty 1

## 2019-11-05 MED ORDER — ACETAMINOPHEN 160 MG/5ML PO SOLN: 650.0000 mg | ORAL | Status: DC | PRN

## 2019-11-05 MED ORDER — SODIUM CHLORIDE 0.9 % IV SOLN: INTRAVENOUS | Status: DC

## 2019-11-05 MED ORDER — ENOXAPARIN SODIUM 40 MG/0.4ML ~~LOC~~ SOLN
40.0000 mg | SUBCUTANEOUS | Status: DC
  Administered 2019-11-05: 40 mg via SUBCUTANEOUS
  Filled 2019-11-05: qty 0.4

## 2019-11-05 MED ORDER — ASPIRIN EC 81 MG PO TBEC
81.0000 mg | DELAYED_RELEASE_TABLET | Freq: Every day | ORAL | Status: DC
  Administered 2019-11-06: 81 mg via ORAL
  Filled 2019-11-05: qty 1

## 2019-11-05 NOTE — H&P (Signed)
History and Physical    Derrick White QZR:[id number] DOB: [birth year] DOA: 11/05/2019  PCP: Haywood Pao, MD Consultants:  Thenaganatt - neurology; Prudence Davidson - podiatry Patient coming from:  Home - lives with wife; NOK: Wife, Keeyon Privitera, [phone]   Chief Complaint: Dysarthria  HPI: Derrick White is a 79 y.o. male with medical history significant of HTN; and cerebellar ataxia along with oropharyngeal dysphagia presenting with dysarthria.  He reports that his speech was impaired yesterday after lunch, lasted maybe 30 minutes, and resolved.  No other complaints and no issues since.  No dysphagia, not on a special diet at home.  No extremity symptom (has chronic R ankle pain).  No headache.  He reports that he is unwilling to stay overnight if this will be an "observation" stay due to insurance reasons.    ED Course:  Needs TIA evaluation.  30 minute episode of aphasia yesterday.  Different from baseline dysphasia.  ABCD2 score is 4.  Review of Systems: As per HPI; otherwise review of systems reviewed and negative.   Ambulatory Status:  Ambulates without assistance  COVID Vaccine Status:   Complete  Past Medical History:  Diagnosis Date  . Cerebellar ataxia ([place])   . History of hiatal hernia   . History of kidney stones   . Hypertension     Past Surgical History:  Procedure Laterality Date  . APPENDECTOMY     1940s  . [place]  . FRACTURE SURGERY     Left ankle, sx x4  . INGUINAL HERNIA REPAIR Right 03/05/2019   Procedure: OPEN RIGHT INGUINAL HERNIA REPAIR WITH MESH;  Surgeon: Clovis Riley, MD;  Location: [facility];  Service: General;  Laterality: Right;  . KNEE SURGERY Left 1985  . SHOULDER SURGERY Left   . TONSILLECTOMY     1945    Social History   Socioeconomic History  . Marital status: Married    Spouse name: Not on file  . Number of children: Not on file  . Years of education: Not on file  . Highest education level: Not on file   Occupational History  . Occupation: retired  Tobacco Use  . Smoking status: Former Smoker    Packs/day: 1.00    Years: 30.00    Pack years: 30.00    Types: Cigarettes    Quit date: 1995    Years since quitting: 26.4  . Smokeless tobacco: Never Used  Vaping Use  . Vaping Use: Never used  Substance and Sexual Activity  . Alcohol use: Not Currently    Alcohol/week: 14.0 standard drinks    Types: 14 Cans of beer per week    Comment: 2 beers a day  . Drug use: Never  . Sexual activity: Not on file  Other Topics Concern  . Not on file  Social History Narrative  . Not on file   Social Determinants of Health   Financial Resource Strain:   . Difficulty of Paying Living Expenses:   Food Insecurity:   . Worried About Charity fundraiser in the Last Year:   . Arboriculturist in the Last Year:   Transportation Needs:   . Film/video editor (Medical):   Marland Kitchen Lack of Transportation (Non-Medical):   Physical Activity:   . Days of Exercise per Week:   . Minutes of Exercise per Session:   Stress:   . Feeling of Stress :   Social Connections:   .  Frequency of Communication with Friends and Family:   . Frequency of Social Gatherings with Friends and Family:   . Attends Religious Services:   . Active Member of Clubs or Organizations:   . Attends Archivist Meetings:   Marland Kitchen Marital Status:   Intimate Partner Violence:   . Fear of Current or Ex-Partner:   . Emotionally Abused:   Marland Kitchen Physically Abused:   . Sexually Abused:     Allergies  Allergen Reactions  . Morphine Hives and Itching  . Codeine Hives and Itching  . Propoxyphene Itching    DARVOCET  . Shrimp [Shellfish Allergy] Rash    Family History  Problem Relation Age of Onset  . Stroke Neg Hx     Prior to Admission medications   Medication Sig Start Date End Date Taking? Authorizing Provider  acetaminophen (TYLENOL) 500 MG tablet Take 500 mg by mouth every 6 (six) hours as needed for moderate pain or  headache.    [provider]  Cholecalciferol (VITAMIN D-1000 MAX ST) 1000 units tablet Take 1,000 Units by mouth 3 (three) times a week.     [provider]  cyanocobalamin 1000 MCG tablet Take by mouth.    [provider]  ibuprofen (ADVIL) 200 MG tablet Take 200 mg by mouth every 6 (six) hours as needed for headache or moderate pain.    [provider]  pantoprazole (PROTONIX) 40 MG tablet Take 40 mg by mouth daily.  11/27/14   [provider]  pravastatin (PRAVACHOL) 40 MG tablet Take 40 mg by mouth at bedtime.  12/18/14   [provider]  Propylene Glycol (SYSTANE BALANCE) 0.6 % SOLN Place 1 drop into both eyes daily as needed (burning).    [provider]  telmisartan (MICARDIS) 80 MG tablet Take 80 mg by mouth daily.  12/16/16   [provider]  triamcinolone cream (KENALOG) 0.1 % APPLY TWICE A DAY MIX 3 1 W/ SILVER SULFADIAZINE 1% CREAM FOR 2 WEEKS.NOT TO USE ON FACE 06/09/19   [provider]    Physical Exam: Vitals:   11/05/19 0955 11/05/19 1345  BP: (!) 151/78 (!) 148/85  Pulse: 70 69  Resp: 16 15  Temp: (!) 97.5 F (36.4 C)   TempSrc: Oral   SpO2: 97% 96%  Weight: 81.6 kg   Height: 5\' 10"  (1.778 m)      . General:  Appears calm and comfortable and is NAD . Eyes:  PERRL, EOMI, normal lids, iris . ENT:  grossly normal hearing, lips & tongue, mmm . Neck:  no LAD, masses or thyromegaly . Cardiovascular:  RRR, no m/r/g. No LE edema.  Marland Kitchen Respiratory:   CTA bilaterally with no wheezes/rales/rhonchi.  Normal respiratory effort. . Abdomen:  soft, NT, ND, NABS . Skin:  no rash or induration seen on limited exam . Musculoskeletal:  grossly normal tone BUE/BLE, good ROM, no bony abnormality; possibly very subtle RLE weakness that he says is unchanged from baseline . Psychiatric:  grossly normal mood and affect, speech with mild dysarthria that he says is baseline, AOx3 . Neurologic:  CN 2-12 grossly  intact, moves all extremities in coordinated fashion, sensation intact other than possible very subtle LLE decreased sensation vs. Right that he also says is chronic    Radiological Exams on Admission: CT HEAD WO CONTRAST  Result Date: 11/05/2019 CLINICAL DATA:  79 year old male with onset of slurred speech yesterday, minimally improved. EXAM: CT HEAD WITHOUT CONTRAST TECHNIQUE: Contiguous axial images were  obtained from the base of the skull through the vertex without intravenous contrast. COMPARISON:  Brain MRI 03/30/2019 and earlier. FINDINGS: Brain: Patchy periventricular white matter hypodensity appears stable to FLAIR signal findings on prior MRI. Stable cerebral volume. No midline shift, ventriculomegaly, mass effect, evidence of mass lesion, intracranial hemorrhage or evidence of cortically based acute infarction. No cortical encephalomalacia identified. Vascular: Calcified atherosclerosis at the skull base. Indeterminate hyperdensity of a somewhat distal left MCA posterior branch in the sylvian fissure on series 3, image 17 (no prior CT for comparison). No regional cytotoxic edema. Skull: Negative. Sinuses/Orbits: Visualized paranasal sinuses and mastoids are stable and well pneumatized. Other: Postoperative changes to both globes. No acute orbit or scalp soft tissue finding. IMPRESSION: 1. No definite acute intracranial abnormality; there is indeterminate hyperdensity of a relatively distal Left MCA branch in the left sylvian fissure - but no CT changes of a recent cortical infarct. Therefore favor this is calcified atherosclerosis. 2. 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 Electronically Signed   By: Genevie Ann M.D.   On: 11/05/2019 11:41    EKG: Independently reviewed.  NSR with rate 73;  no evidence of acute ischemia   Labs on Admission: I have personally reviewed the available labs and imaging studies at the time of the admission.  Pertinent labs:   Unremarkable  CMP other than stage 3 CKD Normal CBC INR 1.1   Assessment/Plan Principal Problem:   TIA (transient ischemic attack) Active Problems:   Slurred speech   Essential hypertension   Cerebellar ataxia (HCC)    CVA -Patient with chronic neurologic deficits (see below) presenting with transient episode of dysarthria/aphasia yesterday, lasting for 30 minutes and resolving back to baseline -Concerning for TIA/CVA -Will admit for further CVA evaluation -Telemetry monitoring -Carotid dopplers -Echo -Risk stratification with FLP; will also check TSH  -Start ASA daily (325 mg now and 81 mg daily starting tomorrow) -Neurology consult  -PT/OT/ST/Nutrition Consults  HTN -Allow permissive HTN for now -Treat BP only if >220/120, and then with goal of 15% reduction -Resume ARB in 48-72 hours   HLD -Check FLP -Resume statin but will change Pravachol to Lipitor 40 mg daily   Cerebellar ataxia -Patient is followed by outpatient neurology at [facility] and will need close outpatient f/u -Also with chronic dysarthria and dysphagia (had dietary recommendations in Epic but patient denies)    Note: This patient has been tested and is pending for the novel coronavirus COVID-19.      DVT prophylaxis:  Lovenox  Code Status: DNR - confirmed with patient Family Communication: None present; he did not request that I contact his family at the time of admission Disposition Plan:  The patient is from: home  Anticipated d/c is to: home with [facility] services  Anticipated d/c date will depend on clinical response to treatment, likely 2-3 days  Patient is currently: acutely ill Consults called: Neurology; PT/OT/ST/Nutrition  Admission status: Admit - It is my clinical opinion that admission to [place] is reasonable and necessary because of the expectation that this patient will require hospital care that crosses at least 2 midnights to treat this condition based on the medical complexity of the problems  presented.  Given the aforementioned information, the predictability of an adverse outcome is felt to be significant.    Karmen Bongo MD [facility]   How to contact the [facility] Attending or Consulting provider [place] or covering provider during after hours [place], for this patient?  1. Check the care team in [facility] and look for a) attending/consulting [facility] provider listed and b) the [facility] team listed 2. Log into [email or web address] and use [place]'s universal password to access. If you do not have the password, please contact the hospital operator. 3. Locate the [facility] provider you are looking for under [facility] and page to a number that you can be directly reached. 4. If you still have difficulty reaching the provider, please page the [facility] (Director on Call) for the Hospitalists listed on amion for assistance.   11/05/2019, 2:04 PM

## 2019-11-05 NOTE — ED Provider Notes (Signed)
Medical screening examination/treatment/procedure(s) were conducted as a shared visit with non-physician practitioner(s) and myself.  I personally evaluated the patient during the encounter.   Patient presented to ED for evaluation of slurred speech that occurred yesterday at 3 PM.  The symptoms have all resolved at this point.  Patient does have dysarthria but this is chronic condition for him and he feels that his speech is back to baseline.  We will proceed with stroke, tia workup.   Dorie Rank, MD 11/05/19 [phone]

## 2019-11-05 NOTE — Progress Notes (Signed)
  Echocardiogram 2D Echocardiogram has been performed.  Derrick White 11/05/2019, 5:33 PM

## 2019-11-05 NOTE — Consult Note (Addendum)
NEURO HOSPITALIST  CONSULT   Requesting Physician: Dr. Lorin Mercy    Chief Complaint: aphasia  History obtained from:  Patient   HPI:                                                                                                                                         Derrick White is an 79 y.o. male  With PMH HTN, cerebellar ataxia who presented to [facility] ED with a transient episode of aphasia.  Patient stated that yesterday after lunch about 1400 he had a sudden onset of garbled speech. He was not able to understand what his wife was saying and his wife was unable  To understand what he was trying to say. This lasted about 30 minutes and then resolved. He called his neurologist who works at [employer] ( [place]) who recommended for him to come to ED. Patient denies CP, SOB, room spinning, dizziness, blurred vision, drug use, smoking, ASA. Endorses social drinking.  Patient at baseline has slurred speech for past 2-3 years-etiology unclear and under investigation by the neurology team at [facility].  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 Buck Mam for cerebellar ataxia and dysphagia. swallow study did show some difficulties with aspiration when swallowing.   Modified Rankin: Rankin Score=2  NIHSS:1  Past Medical History:  Diagnosis Date  . Cerebellar ataxia ([place])   . History of hiatal hernia   . History of kidney stones   . Hypertension     Past Surgical History:  Procedure Laterality Date  . APPENDECTOMY     1940s  . [place]  . FRACTURE SURGERY     Left ankle, sx x4  . INGUINAL HERNIA REPAIR Right 03/05/2019   Procedure: OPEN RIGHT INGUINAL HERNIA REPAIR WITH MESH;  Surgeon: Clovis Riley, MD;  Location: [facility];  Service: General;  Laterality: Right;  . KNEE SURGERY Left 1985  . SHOULDER SURGERY  Left   . TONSILLECTOMY     1945    Family History  Problem Relation Age of Onset  . Stroke Neg Hx    Social History:  reports that he quit smoking about 26 years ago. His smoking use included cigarettes. He has a 30.00 pack-year smoking history. He has never used smokeless tobacco. He reports previous alcohol use of about 14.0 standard drinks of alcohol per week. He reports that he does not use drugs.  Allergies:  Allergies  Allergen Reactions  . Morphine Hives and Itching  .  Codeine Hives and Itching  . Propoxyphene Itching    DARVOCET  . Shrimp [Shellfish Allergy] Rash    Medications:                                                                                                                           Current Facility-Administered Medications  Medication Dose Route Frequency Provider Last Rate Last Admin  .  stroke: mapping our early stages of recovery book   Does not apply Once Karmen Bongo, MD      . 0.9 %  sodium chloride infusion   Intravenous Continuous Karmen Bongo, MD      . acetaminophen (TYLENOL) tablet 650 mg  650 mg Oral Q4H PRN Karmen Bongo, MD       Or  . acetaminophen (TYLENOL) 160 MG/5ML solution 650 mg  650 mg Per Tube Q4H PRN Karmen Bongo, MD       Or  . acetaminophen (TYLENOL) suppository 650 mg  650 mg Rectal Q4H PRN Karmen Bongo, MD      . Derrill Memo ON 11/06/2019] aspirin EC tablet 81 mg  81 mg Oral Daily Karmen Bongo, MD      . atorvastatin (LIPITOR) tablet 40 mg  40 mg Oral Daily Karmen Bongo, MD   40 mg at 11/05/19 1623  . enoxaparin (LOVENOX) injection 40 mg  40 mg Subcutaneous Q24H Karmen Bongo, MD      . Derrill Memo ON 11/06/2019] pantoprazole (PROTONIX) EC tablet 40 mg  40 mg Oral Daily Karmen Bongo, MD      . polyvinyl alcohol (LIQUIFILM TEARS) 1.4 % ophthalmic solution 1 drop  1 drop Both Eyes Daily PRN Karmen Bongo, MD      . senna-docusate (Senokot-S) tablet 1 tablet  1 tablet Oral QHS PRN Karmen Bongo, MD       Current  Outpatient Medications  Medication Sig Dispense Refill  . acetaminophen (TYLENOL) 500 MG tablet Take 500 mg by mouth every 6 (six) hours as needed for moderate pain or headache.    . Cholecalciferol (VITAMIN D-1000 MAX ST) 1000 units tablet Take 1,000 Units by mouth daily.     . cyanocobalamin 1000 MCG tablet Take 1,000 mcg by mouth daily.     Marland Kitchen ibuprofen (ADVIL) 200 MG tablet Take 200 mg by mouth every 6 (six) hours as needed for headache or moderate pain.    . pantoprazole (PROTONIX) 40 MG tablet Take 40 mg by mouth daily.   2  . Propylene Glycol (SYSTANE BALANCE) 0.6 % SOLN Place 1 drop into both eyes daily as needed (burning).    Marland Kitchen telmisartan (MICARDIS) 80 MG tablet Take 80 mg by mouth daily.     Marland Kitchen triamcinolone cream (KENALOG) 0.1 % Apply 1 application topically daily as needed (itching).        ROS:  ROS was performed and is negative except as noted in HPI    General Examination:                                                                                                      Blood pressure (!) 157/76, pulse 62, temperature (!) 97.5 F (36.4 C), temperature source Oral, resp. rate 11, height 5\' 10"  (1.778 m), weight 81.6 kg, SpO2 97 %.  Physical Exam  Constitutional: Appears well-developed and well-nourished.  Psych: Affect appropriate to situation Eyes: Normal external eye and conjunctiva. HENT: Normocephalic, no lesions, without obvious abnormality.   Musculoskeletal-no joint tenderness, deformity or swelling Cardiovascular: Normal rate and regular rhythm.  Respiratory: Effort normal, non-labored breathing saturations WNL GI: Soft.  No distension. There is no tenderness.  Skin: WDI  Neurological Examination Mental Status: Alert, oriented, thought content appropriate.  Mild dysarthria. Speech fluent without evidence of aphasia.  Able to  follow   commands without difficulty. Cranial Nerves: II:  Visual fields grossly normal,  III,IV, VI: ptosis not present, extra-ocular motions intact bilaterally, pupils equal, round, reactive to light and accommodation V,VII: smile symmetric, facial light touch sensation normal bilaterally VIII: hearing normal bilaterally IX,X: uvula rises midline XI: bilateral shoulder shrug XII: midline tongue extension Motor: Right : Upper extremity   5/5  Left:     Upper extremity   5/5  Lower extremity   5/5   Lower extremity   5/5 Tone and bulk:normal tone throughout; no atrophy noted Sensory:  light touch intact throughout, bilaterally Cerebellar: No significant ataxia but has dysdiadochokinesia, impaired finger taps. Gait: deferred   Lab Results: Basic Metabolic Panel: Recent Labs  Lab 11/05/19 0959 11/05/19 1013  NA 139 141  K 4.1 4.1  CL 107 105  CO2 22  --   GLUCOSE 96 99  BUN 18 20  CREATININE 1.17 1.10  CALCIUM 9.2  --     CBC: Recent Labs  Lab 11/05/19 0959 11/05/19 1013  WBC 6.7  --   NEUTROABS 3.3  --   HGB 14.2 14.3  HCT 43.4 42.0  MCV 98.0  --   PLT 372  --     Lipid Panel: Recent Labs  Lab 11/05/19 0959  CHOL 165  TRIG 161*  HDL 51  CHOLHDL 3.2  VLDL 32  LDLCALC 82    CBG: Recent Labs  Lab 11/05/19 1024  GLUCAP 13    Imaging: CT HEAD WO CONTRAST  Result Date: 11/05/2019 CLINICAL DATA:  79 year old male with onset of slurred speech yesterday, minimally improved. EXAM: CT HEAD WITHOUT CONTRAST TECHNIQUE: Contiguous axial images were obtained from the base of the skull through the vertex without intravenous contrast. COMPARISON:  Brain MRI 03/30/2019 and earlier. FINDINGS: Brain: Patchy periventricular white matter hypodensity appears stable to FLAIR signal findings on prior MRI. Stable cerebral volume. No midline shift, ventriculomegaly, mass effect, evidence of mass lesion, intracranial hemorrhage or evidence of cortically based acute infarction.  No cortical encephalomalacia identified. Vascular: Calcified atherosclerosis at the skull base. Indeterminate hyperdensity of a somewhat distal left MCA posterior branch in  the sylvian fissure on series 3, image 17 (no prior CT for comparison). No regional cytotoxic edema. Skull: Negative. Sinuses/Orbits: Visualized paranasal sinuses and mastoids are stable and well pneumatized. Other: Postoperative changes to both globes. No acute orbit or scalp soft tissue finding. IMPRESSION: 1. No definite acute intracranial abnormality; there is indeterminate hyperdensity of a relatively distal Left MCA branch in the left sylvian fissure - but no CT changes of a recent cortical infarct. Therefore favor this is calcified atherosclerosis. 2. 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 Electronically Signed   By: Genevie Ann M.D.   On: 11/05/2019 11:41   MR ANGIO HEAD WO CONTRAST  Result Date: 11/05/2019 CLINICAL DATA:  TIA.  Slurred speech. EXAM: MRI HEAD WITHOUT CONTRAST MRA HEAD WITHOUT CONTRAST MRA NECK WITHOUT CONTRAST TECHNIQUE: Multiplanar, multiecho pulse sequences of the brain and surrounding structures were obtained without intravenous contrast. Angiographic images of the Circle of Willis were obtained using MRA technique without intravenous contrast. Angiographic images of the neck were obtained using MRA technique without intravenous contrast. Carotid stenosis measurements (when applicable) are obtained utilizing NASCET criteria, using the distal internal carotid diameter as the denominator. COMPARISON:  Head CT 11/05/2019 and MRI 03/30/2019 FINDINGS: MRI HEAD FINDINGS Brain: There is a 3 mm acute cortical infarct in the posterior aspect of the left temporal operculum. No intracranial hemorrhage, mass, midline shift, or extra-axial fluid collection is identified. Patchy and confluent T2 hyperintensities in the cerebral white matter are unchanged from the prior MRI and are nonspecific but compatible  with moderate chronic small vessel ischemic disease. Mild chronic small vessel changes are also noted in the pons. There is moderate cerebral atrophy. Vascular: Major intracranial vascular flow voids are preserved. Skull and upper cervical spine: Unremarkable bone marrow signal. Partially visualized anterior cervical spine fusion. Sinuses/Orbits: Unremarkable orbits. Trace right mastoid fluid. Clear paranasal sinuses. Other: None. MRA HEAD FINDINGS The visualized distal vertebral arteries are widely patent to the basilar. The basilar artery is widely patent with motion artifact noted through its proximal and mid portions. Patent PICAs and SCAs are seen bilaterally. There is a large left posterior communicating artery. Both PCAs are patent without evidence of a significant proximal stenosis. The internal carotid arteries are patent from skull base to carotid termini without evidence of significant stenosis within limitations of motion artifact which is most notable through the cavernous segments. ACAs and MCAs are patent without evidence of a proximal branch occlusion or significant proximal stenosis. No aneurysm is identified. MRA NECK FINDINGS There is a standard 3 vessel aortic arch with widely patent arch vessel origins. The common carotid and cervical internal carotid arteries are patent bilaterally without evidence of significant stenosis or dissection. There is evidence of eccentric atherosclerotic plaque posteriorly at the carotid bulb which does not result in significant stenosis. The vertebral arteries are patent and codominant with antegrade flow bilaterally. No significant vertebral artery stenosis or dissection is evident. IMPRESSION: 1. 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 2. Moderate chronic small vessel ischemic disease. 3. Motion degraded head MRA without evidence of a large or medium vessel occlusion or significant proximal stenosis. 4. Negative neck MRA. Electronically Signed   By: Logan Bores M.D.   On: 11/05/2019 16:09   MR ANGIO NECK WO CONTRAST  Result Date: 11/05/2019 CLINICAL DATA:  TIA.  Slurred speech. EXAM: MRI HEAD WITHOUT CONTRAST MRA HEAD WITHOUT CONTRAST MRA NECK WITHOUT CONTRAST TECHNIQUE: Multiplanar, multiecho pulse sequences of the brain and surrounding structures were  obtained without intravenous contrast. Angiographic images of the Circle of Willis were obtained using MRA technique without intravenous contrast. Angiographic images of the neck were obtained using MRA technique without intravenous contrast. Carotid stenosis measurements (when applicable) are obtained utilizing NASCET criteria, using the distal internal carotid diameter as the denominator. COMPARISON:  Head CT 11/05/2019 and MRI 03/30/2019 FINDINGS: MRI HEAD FINDINGS Brain: There is a 3 mm acute cortical infarct in the posterior aspect of the left temporal operculum. No intracranial hemorrhage, mass, midline shift, or extra-axial fluid collection is identified. Patchy and confluent T2 hyperintensities in the cerebral white matter are unchanged from the prior MRI and are nonspecific but compatible with moderate chronic small vessel ischemic disease. Mild chronic small vessel changes are also noted in the pons. There is moderate cerebral atrophy. Vascular: Major intracranial vascular flow voids are preserved. Skull and upper cervical spine: Unremarkable bone marrow signal. Partially visualized anterior cervical spine fusion. Sinuses/Orbits: Unremarkable orbits. Trace right mastoid fluid. Clear paranasal sinuses. Other: None. MRA HEAD FINDINGS The visualized distal vertebral arteries are widely patent to the basilar. The basilar artery is widely patent with motion artifact noted through its proximal and mid portions. Patent PICAs and SCAs are seen bilaterally. There is a large left posterior communicating artery. Both PCAs are patent without evidence of a significant proximal stenosis. The internal carotid arteries  are patent from skull base to carotid termini without evidence of significant stenosis within limitations of motion artifact which is most notable through the cavernous segments. ACAs and MCAs are patent without evidence of a proximal branch occlusion or significant proximal stenosis. No aneurysm is identified. MRA NECK FINDINGS There is a standard 3 vessel aortic arch with widely patent arch vessel origins. The common carotid and cervical internal carotid arteries are patent bilaterally without evidence of significant stenosis or dissection. There is evidence of eccentric atherosclerotic plaque posteriorly at the carotid bulb which does not result in significant stenosis. The vertebral arteries are patent and codominant with antegrade flow bilaterally. No significant vertebral artery stenosis or dissection is evident. IMPRESSION: 1. 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 2. Moderate chronic small vessel ischemic disease. 3. Motion degraded head MRA without evidence of a large or medium vessel occlusion or significant proximal stenosis. 4. Negative neck MRA. Electronically Signed   By: Logan Bores M.D.   On: 11/05/2019 16:09   MR BRAIN WO CONTRAST  Result Date: 11/05/2019 CLINICAL DATA:  TIA.  Slurred speech. EXAM: MRI HEAD WITHOUT CONTRAST MRA HEAD WITHOUT CONTRAST MRA NECK WITHOUT CONTRAST TECHNIQUE: Multiplanar, multiecho pulse sequences of the brain and surrounding structures were obtained without intravenous contrast. Angiographic images of the Circle of Willis were obtained using MRA technique without intravenous contrast. Angiographic images of the neck were obtained using MRA technique without intravenous contrast. Carotid stenosis measurements (when applicable) are obtained utilizing NASCET criteria, using the distal internal carotid diameter as the denominator. COMPARISON:  Head CT 11/05/2019 and MRI 03/30/2019 FINDINGS: MRI HEAD FINDINGS Brain: There is a 3 mm acute cortical infarct in the  posterior aspect of the left temporal operculum. No intracranial hemorrhage, mass, midline shift, or extra-axial fluid collection is identified. Patchy and confluent T2 hyperintensities in the cerebral white matter are unchanged from the prior MRI and are nonspecific but compatible with moderate chronic small vessel ischemic disease. Mild chronic small vessel changes are also noted in the pons. There is moderate cerebral atrophy. Vascular: Major intracranial vascular flow voids are preserved. Skull and upper cervical spine: Unremarkable bone  marrow signal. Partially visualized anterior cervical spine fusion. Sinuses/Orbits: Unremarkable orbits. Trace right mastoid fluid. Clear paranasal sinuses. Other: None. MRA HEAD FINDINGS The visualized distal vertebral arteries are widely patent to the basilar. The basilar artery is widely patent with motion artifact noted through its proximal and mid portions. Patent PICAs and SCAs are seen bilaterally. There is a large left posterior communicating artery. Both PCAs are patent without evidence of a significant proximal stenosis. The internal carotid arteries are patent from skull base to carotid termini without evidence of significant stenosis within limitations of motion artifact which is most notable through the cavernous segments. ACAs and MCAs are patent without evidence of a proximal branch occlusion or significant proximal stenosis. No aneurysm is identified. MRA NECK FINDINGS There is a standard 3 vessel aortic arch with widely patent arch vessel origins. The common carotid and cervical internal carotid arteries are patent bilaterally without evidence of significant stenosis or dissection. There is evidence of eccentric atherosclerotic plaque posteriorly at the carotid bulb which does not result in significant stenosis. The vertebral arteries are patent and codominant with antegrade flow bilaterally. No significant vertebral artery stenosis or dissection is evident.  IMPRESSION: 1. 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 2. Moderate chronic small vessel ischemic disease. 3. Motion degraded head MRA without evidence of a large or medium vessel occlusion or significant proximal stenosis. 4. Negative neck MRA. Electronically Signed   By: Logan Bores M.D.   On: 11/05/2019 16:09   ECHOCARDIOGRAM COMPLETE  Result Date: 11/05/2019    ECHOCARDIOGRAM REPORT   Patient Name:   Derrick White Date of Exam: 11/05/2019 Medical Rec #:  [id number]       Height:       70.0 in Accession #:    [id number]      Weight:       180.0 lb Date of Birth:  Sep 01, 1940        BSA:          1.996 m Patient Age:    61 years        BP:           157/76 mmHg Patient Gender: M               HR:           64 bpm. Exam Location:  Inpatient Procedure: 2D Echo Indications:    TIA  History:        Patient has no prior history of Echocardiogram examinations.                 Risk Factors:Hypertension.  Sonographer:    Johny Chess Referring Phys: [place]  1. Left ventricular ejection fraction, by estimation, is 60 to 65%. The left ventricle has normal function. The left ventricle has no regional wall motion abnormalities. Left ventricular diastolic parameters were normal.  2. Right ventricular systolic function is normal. The right ventricular size is normal. There is normal pulmonary artery systolic pressure.  3. The mitral valve is normal in structure. No evidence of mitral valve regurgitation. No evidence of mitral stenosis.  4. The aortic valve is normal in structure. Aortic valve regurgitation is not visualized. No aortic stenosis is present.  5. Aortic dilatation noted. There is borderline dilatation of the aortic root.  6. The inferior vena cava is normal in size with greater than 50% respiratory variability, suggesting right atrial pressure of 3 mmHg. FINDINGS  Left Ventricle: Left ventricular ejection fraction, by estimation,  is 60 to 65%. The left ventricle has normal  function. The left ventricle has no regional wall motion abnormalities. The left ventricular internal cavity size was normal in size. There is  no left ventricular hypertrophy. Left ventricular diastolic parameters were normal. Normal left ventricular filling pressure. Right Ventricle: The right ventricular size is normal. No increase in right ventricular wall thickness. Right ventricular systolic function is normal. There is normal pulmonary artery systolic pressure. The tricuspid regurgitant velocity is 2.30 m/s, and  with an assumed right atrial pressure of 3 mmHg, the estimated right ventricular systolic pressure is 24.5 mmHg. Left Atrium: Left atrial size was normal in size. Right Atrium: Right atrial size was normal in size. Pericardium: There is no evidence of pericardial effusion. Mitral Valve: The mitral valve is normal in structure. Normal mobility of the mitral valve leaflets. No evidence of mitral valve regurgitation. No evidence of mitral valve stenosis. Tricuspid Valve: The tricuspid valve is normal in structure. Tricuspid valve regurgitation is not demonstrated. No evidence of tricuspid stenosis. Aortic Valve: The aortic valve is normal in structure. Aortic valve regurgitation is not visualized. No aortic stenosis is present. Pulmonic Valve: The pulmonic valve was normal in structure. Pulmonic valve regurgitation is not visualized. No evidence of pulmonic stenosis. Aorta: Aortic dilatation noted. There is borderline dilatation of the aortic root. Venous: The inferior vena cava is normal in size with greater than 50% respiratory variability, suggesting right atrial pressure of 3 mmHg. IAS/Shunts: No atrial level shunt detected by color flow Doppler.  LEFT VENTRICLE PLAX 2D LVIDd:         4.70 cm  Diastology LVIDs:         3.10 cm  LV e' lateral:   11.40 cm/s LV PW:         0.70 cm  LV E/e' lateral: 6.2 LV IVS:        0.90 cm  LV e' medial:    8.92 cm/s LVOT diam:     2.00 cm  LV E/e' medial:  8.0 LV  SV:         69 LV SV Index:   35 LVOT Area:     3.14 cm  RIGHT VENTRICLE             IVC RV S prime:     12.90 cm/s  IVC diam: 1.10 cm TAPSE (M-mode): 2.3 cm LEFT ATRIUM             Index       RIGHT ATRIUM           Index LA diam:        3.70 cm 1.85 cm/m  RA Area:     16.50 cm LA Vol (A2C):   54.9 ml 27.51 ml/m RA Volume:   39.20 ml  19.64 ml/m LA Vol (A4C):   64.5 ml 32.32 ml/m LA Biplane Vol: 60.0 ml 30.06 ml/m  AORTIC VALVE LVOT Vmax:   94.80 cm/s LVOT Vmean:  58.600 cm/s LVOT VTI:    0.220 m  AORTA Ao Root diam: 4.10 cm Ao Asc diam:  3.40 cm MITRAL VALVE               TRICUSPID VALVE MV Area (PHT): 3.99 cm    TR Peak grad:   21.2 mmHg MV Decel Time: 190 msec    TR Vmax:        230.00 cm/s MV E velocity: 71.00 cm/s MV A velocity: 71.70 cm/s  SHUNTS MV E/A ratio:  0.99        Systemic VTI:  0.22 m                            Systemic Diam: 2.00 cm Sanda Klein MD Electronically signed by Sanda Klein MD Signature Date/Time: 11/05/2019/5:44:56 PM    Final      Laurey Morale, MSN, NP-C [facility] [phone]  11/05/2019, 5:49 PM    Assessment: 79 y.o. male with PMH HTN, cerebellar ataxia who presented to [facility] ED with transient episode of aphasia. CTH did not show a hemorrhage. Patient was not a candidate for TPA d/t presenting outside the window. MRI did show a 3 mm acute  left temporal infarct. Admit for complete stroke work-up Etiology likely small vessel disease.  Stroke Risk Factors - hyperlipidemia and hypertension    Recommendations: -- BP goal : Permissive HTN upto 220/110 mmHg (for 24-48 post admission ) goal is to normalize BP < 140/90 by discharge. --Echocardiogram -- ASA and plavix -- High intensity Statin if LDL > 70 ( currently on pravastatin) -- HgbA1c, fasting lipid panel -- PT consult, OT consult, Speech consult --Telemetry monitoring --Frequent neuro checks --Stroke swallow screen    --Please page the Stroke team from 8am-4pm.   You can look  them up on [email or web address]    Attending addendum Agree with history and physical above. Patient seen and examined independently.  History, review of systems and imaging independently reviewed. Likely small vessel etiology stroke.  -- Amie [place], MD [facility] Pager: [phone] If 7pm to 7am, please call on call as listed on AMION.

## 2019-11-05 NOTE — ED Provider Notes (Signed)
[facility] EMERGENCY DEPARTMENT Provider Note   CSN: [id number] Arrival date & time: 11/05/19  1025     History Chief Complaint  Patient presents with  . Aphasia    Derrick White is a 79 y.o. male with pertinent past medical history of hypertension that presents emergency department today for aphasia that began yesterday around 3 PM.  Aphasia lasted for about 30 minutes.  Is present with wife who relates most of the history.  States that he was gurgling and mumbling his words for about 30 minutes, states that she could not understand him and he states that he could not get out the words he wanted to.  States that they called EMS and by the time EMS arrived he was back to baseline.  Patient states that he called PCP today who told him to come to the ER for further work-up.  States that nothing like this has happened to him before, states that he is completely fine now.  Wife and patient deny any drooling, any new slurred speech, facial droop, weakness, paresthesias, gait disturbances.  Per note, patient has had new slurred speech for the past 2-1/2 years, is being worked up by Dr. Delfino Lovett.  Patient states that his 30 minutes of aphasia was extremely different than his normal slurred speech.  Patient wife agrees with this.  Wife states that he has had slurred speech patient denies any head injury, neck pain, fever, chills, nausea, vomiting, abdominal pain, chest pain, back pain.  Patient states that he was in normal health before this.  Patient does not take any blood thinners.  Patient states that he is fairly healthy.  HPI     Past Medical History:  Diagnosis Date  . Cerebellar ataxia ([place])   . History of hiatal hernia   . History of kidney stones   . Hypertension     Patient Active Problem List   Diagnosis Date Noted  . TIA (transient ischemic attack) 11/05/2019  . Essential hypertension 11/05/2019  . 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 C6 radiculopathy 05/04/2019  . Pain due to onychomycosis of toenails of both feet 04/28/2019  . Worsened handwriting 03/16/2019  . Gait disturbance 03/16/2019  . Slurred speech 03/16/2019  . Memory loss 03/16/2019  . Pain in left ankle and joints of left foot 08/06/2018  . 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  . Foot pain, 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  . Dysesthesia 06/04/2016  . 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 History of cervical spinal surgery 06/04/2016    Past Surgical History:  Procedure Laterality Date  . APPENDECTOMY     1940s  . [place]  . FRACTURE SURGERY     Left ankle, sx x4  . INGUINAL HERNIA REPAIR Right 03/05/2019   Procedure: OPEN RIGHT INGUINAL HERNIA REPAIR WITH MESH;  Surgeon: Clovis Riley, MD;  Location: [facility];  Service: General;  Laterality: Right;  . KNEE SURGERY Left 1985  . SHOULDER SURGERY Left   . TONSILLECTOMY     1945       Family History  Problem Relation Age of Onset  . Stroke Neg Hx     Social History   Tobacco Use  . Smoking status: Former Smoker    Packs/day: 1.00    Years: 30.00    Pack years: 30.00    Types: Cigarettes    Quit date: 1995    Years since  quitting: 26.4  . Smokeless tobacco: Never Used  Vaping Use  . Vaping Use: Never used  Substance Use Topics  . Alcohol use: Not Currently    Alcohol/week: 14.0 standard drinks    Types: 14 Cans of beer per week    Comment: 2 beers a day  . Drug use: Never    Home Medications Prior to Admission medications   Medication Sig Start Date End Date Taking? Authorizing Provider  acetaminophen (TYLENOL) 500 MG tablet Take 500 mg by mouth every 6 (six) hours as needed for moderate pain or headache.    [provider]  Cholecalciferol (VITAMIN D-1000 MAX ST) 1000 units tablet Take 1,000 Units by mouth 3 (three) times a week.     [provider]  cyanocobalamin 1000 MCG tablet Take by mouth.     [provider]  ibuprofen (ADVIL) 200 MG tablet Take 200 mg by mouth every 6 (six) hours as needed for headache or moderate pain.    [provider]  pantoprazole (PROTONIX) 40 MG tablet Take 40 mg by mouth daily.  11/27/14   [provider]  pravastatin (PRAVACHOL) 40 MG tablet Take 40 mg by mouth at bedtime.  12/18/14   [provider]  Propylene Glycol (SYSTANE BALANCE) 0.6 % SOLN Place 1 drop into both eyes daily as needed (burning).    [provider]  telmisartan (MICARDIS) 80 MG tablet Take 80 mg by mouth daily.  12/16/16   [provider]  triamcinolone cream (KENALOG) 0.1 % APPLY TWICE A DAY MIX 3 1 W/ SILVER SULFADIAZINE 1% CREAM FOR 2 WEEKS.NOT TO USE ON FACE 06/09/19   [provider]    Allergies    Morphine, Codeine, Propoxyphene, and Shrimp [shellfish allergy]  Review of Systems   Review of Systems  Constitutional: Negative for chills, diaphoresis, fatigue and fever.  HENT: Positive for voice change. Negative for congestion, sore throat and trouble swallowing.   Eyes: Negative for pain and visual disturbance.  Respiratory: Negative for cough, shortness of breath and wheezing.   Cardiovascular: Negative for chest pain, palpitations and leg swelling.  Gastrointestinal: Negative for abdominal distention, abdominal pain, diarrhea, nausea and vomiting.  Genitourinary: Negative for difficulty urinating.  Musculoskeletal: Negative for back pain, neck pain and neck stiffness.  Skin: Negative for pallor.  Neurological: Negative for dizziness, tremors, seizures, syncope, facial asymmetry, speech difficulty, weakness, light-headedness, numbness and headaches.  Psychiatric/Behavioral: Negative for confusion.    Physical Exam Updated Vital Signs BP (!) 157/76   Pulse 62   Temp (!) 97.5 F (36.4 C) (Oral)   Resp 11   Ht 5\' 10"  (1.778 m)   Wt 81.6 kg   SpO2 97%   BMI 25.83 kg/m   Physical Exam Constitutional:       General: He is not in acute distress.    Appearance: Normal appearance. He is not ill-appearing, toxic-appearing or diaphoretic.  HENT:     Ears:     Comments: Right ear with mild crusted blood around ear canal.  No tenderness to outer ear. NT cleaned it and noticed a dried scab. TMS normal bilaterally.     Mouth/Throat:     Mouth: Mucous membranes are moist.     Pharynx: Oropharynx is clear.  Eyes:     General: No scleral icterus.    Extraocular Movements: Extraocular movements intact.     Pupils: Pupils are equal, round, and reactive to light.  Cardiovascular:     Rate and Rhythm: Normal  rate and regular rhythm.     Pulses: Normal pulses.     Heart sounds: Normal heart sounds.  Pulmonary:     Effort: Pulmonary effort is normal. No respiratory distress.     Breath sounds: Normal breath sounds. No stridor. No wheezing, rhonchi or rales.  Chest:     Chest wall: No tenderness.  Abdominal:     General: Abdomen is flat. There is no distension.     Palpations: Abdomen is soft.     Tenderness: There is no abdominal tenderness. There is no guarding or rebound.  Musculoskeletal:        General: No swelling or tenderness. Normal range of motion.     Cervical back: Normal range of motion and neck supple. No rigidity.     Right lower leg: No edema.     Left lower leg: No edema.  Skin:    General: Skin is warm and dry.     Capillary Refill: Capillary refill takes less than 2 seconds.     Coloration: Skin is not pale.  Neurological:     General: No focal deficit present.     Mental Status: He is alert and oriented to person, place, and time.     Comments: Alert and oriented x3. Clear speech with no aphasia or dysarthria. No facial droop. CNIII-XII grossly intact. Bilateral upper and lower extremities' sensation grossly intact. 5/5 symmetric strength with grip strength and with plantar and dorsi flexion bilaterally.  Normal finger to nose bilaterally. Negative pronator drift. Negative Romberg  sign. Gait is steady and intact    Psychiatric:        Mood and Affect: Mood normal.        Behavior: Behavior normal.     ED Results / Procedures / Treatments   Labs (all labs ordered are listed, but only abnormal results are displayed) Labs Reviewed  COMPREHENSIVE METABOLIC PANEL - Abnormal; Notable for the following components:      Result Value   Alkaline Phosphatase 27 (*)    GFR calc non Af Amer 59 (*)    All other components within normal limits  PROTIME-INR  APTT  CBC  DIFFERENTIAL  LIPID PANEL  TSH  I-STAT CHEM 8, ED  CBG MONITORING, ED    EKG None  Radiology CT HEAD WO CONTRAST  Result Date: 11/05/2019 CLINICAL DATA:  79 year old male with onset of slurred speech yesterday, minimally improved. EXAM: CT HEAD WITHOUT CONTRAST TECHNIQUE: Contiguous axial images were obtained from the base of the skull through the vertex without intravenous contrast. COMPARISON:  Brain MRI 03/30/2019 and earlier. FINDINGS: Brain: Patchy periventricular white matter hypodensity appears stable to FLAIR signal findings on prior MRI. Stable cerebral volume. No midline shift, ventriculomegaly, mass effect, evidence of mass lesion, intracranial hemorrhage or evidence of cortically based acute infarction. No cortical encephalomalacia identified. Vascular: Calcified atherosclerosis at the skull base. Indeterminate hyperdensity of a somewhat distal left MCA posterior branch in the sylvian fissure on series 3, image 17 (no prior CT for comparison). No regional cytotoxic edema. Skull: Negative. Sinuses/Orbits: Visualized paranasal sinuses and mastoids are stable and well pneumatized. Other: Postoperative changes to both globes. No acute orbit or scalp soft tissue finding. IMPRESSION: 1. No definite acute intracranial abnormality; there is indeterminate hyperdensity of a relatively distal Left MCA branch in the left sylvian fissure - but no CT changes of a recent cortical infarct. Therefore favor this is  calcified atherosclerosis. 2. 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  Electronically Signed   By: Genevie Ann M.D.   On: 11/05/2019 11:41    Procedures Procedures (including critical care time)  Medications Ordered in ED Medications  aspirin tablet 325 mg (has no administration in time range)  atorvastatin (LIPITOR) tablet 40 mg (has no administration in time range)  sodium chloride flush (NS) 0.9 % injection 3 mL (3 mLs Intravenous Given 11/05/19 1039)    ED Course  I have reviewed the triage vital signs and the nursing notes.  Pertinent labs & imaging results that were available during my care of the patient were reviewed by me and considered in my medical decision making (see chart for details).    MDM Rules/Calculators/A&P   ABCD2 Score: 4           NIH Stroke Scale: 0           Vanderbilt E Brull is a 79 y.o. male with pertinent past medical history of hypertension that presents emergency department today for aphasia that began yesterday around 3 PM.  Aphasia lasted for about 30 minutes. Stable CBC and CMP, negative CT head.  NIHSS scale 0.  ABCD 2 score 4.  Will consult neuro at this time for recommendations of TIA work-up.  Negative neuro exam currently.  Did not call code stroke.  Per chart, code STEMI was activated, this was on the wrong patient.  Patient is not a code STEMI.  1250 spoke to Dr. Rory Percy who recommended inpatient TIA work-up.  Will consult hospitalist for admission at this time.  Repeat neuro exam negative.  130 discussed case with hospitalist, Dr. Lorin Mercy who agrees to accept care of patient.  The patient appears reasonably stabilized for admission considering the current resources, flow, and capabilities available in the ED at this time, and I doubt any other [facility] requiring further screening and/or treatment in the ED prior to admission.  I discussed this case with my attending physician who cosigned this note including patient's presenting  symptoms, physical exam, and planned diagnostics and interventions. Attending physician stated agreement with plan or made changes to plan which were implemented.   Attending physician assessed patient at bedside.  Final Clinical Impression(s) / ED Diagnoses Final diagnoses:  Aphasia    Rx / DC Orders ED Discharge Orders    None       Alfredia Client, PA-C 11/05/19 1751    Dorie Rank, MD 11/06/19 [phone]

## 2019-11-05 NOTE — ED Notes (Signed)
Pt transported to MRI 

## 2019-11-05 NOTE — ED Notes (Signed)
[facility] (Activate Code Stemi) called @ 1233-per Dr. Tomi Bamberger called by Levada Dy

## 2019-11-05 NOTE — ED Triage Notes (Signed)
Patient arrived POV; c/o slurred speech started since 3pm yesterday. Patient stated its getting better.

## 2019-11-05 NOTE — Progress Notes (Signed)
Admitted from ED on stretcher.  Wife at his side.  Oriented to the room and surroundings.

## 2019-11-06 ENCOUNTER — Encounter (HOSPITAL_COMMUNITY): Payer: Medicare Other

## 2019-11-06 DIAGNOSIS — I639 Cerebral infarction, unspecified: Secondary | ICD-10-CM | POA: Diagnosis not present

## 2019-11-06 DIAGNOSIS — G459 Transient cerebral ischemic attack, unspecified: Secondary | ICD-10-CM | POA: Diagnosis not present

## 2019-11-06 DIAGNOSIS — I10 Essential (primary) hypertension: Secondary | ICD-10-CM | POA: Diagnosis not present

## 2019-11-06 LAB — HEMOGLOBIN A1C
Hgb A1c MFr Bld: 5.6 % (ref 4.8–5.6)
Mean Plasma Glucose: 114.02 mg/dL

## 2019-11-06 MED ORDER — ATORVASTATIN CALCIUM 40 MG PO TABS: 40.0000 mg | ORAL_TABLET | Freq: Every day | ORAL | 0 refills | Status: DC

## 2019-11-06 MED ORDER — CLOPIDOGREL BISULFATE 75 MG PO TABS
75.0000 mg | ORAL_TABLET | Freq: Every day | ORAL | 0 refills | Status: AC
Start: 2019-11-06 — End: 2019-12-06

## 2019-11-06 MED ORDER — ASPIRIN 81 MG PO TBEC: 81.0000 mg | DELAYED_RELEASE_TABLET | Freq: Every day | ORAL | 0 refills | Status: AC

## 2019-11-06 NOTE — Progress Notes (Signed)
Occupational Therapy Evaluation Patient Details Name: Derrick White MRN: [id number] DOB: 07-15-1940 Today's Date: 11/06/2019    History of Present Illness 79 y/o admitted on 11/05/19 with a transient episode of aphasia.  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 PMH HTN; and cerebellar ataxia along with oropharyngeal dysphagia.   Clinical Impression   Patient is very kind.  He lives at home with wife and is independent and active at baseline.  He is able to live on main level of home.  Today patient presenting with some expressive difficulties and inconsistent slurred speech.  Able to stand and walk in room with min guard.  Used RW during session though patient demonstrated safety without it. Suggested using it for increased stability initially if he felt better with it.  UB strength WFL bilaterally.  Patient did have some difficulty following multi step commands, requiring repetition of plan, though may be due to some HOH.  Patient is safe to return home without therapy, though will continue to follow with OT acutely to address the deficits listed below.      Follow Up Recommendations  No OT follow up;Supervision - Intermittent    Equipment Recommendations  None recommended by OT    Recommendations for Other Services       Precautions / Restrictions Precautions Precautions: Fall Restrictions Weight Bearing Restrictions: No      Mobility Bed Mobility Overal bed mobility: Needs Assistance Bed Mobility: Sit to Supine       Sit to supine: Supervision   General bed mobility comments: Patient up in chair on arrival  Transfers Overall transfer level: Needs assistance Equipment used: Rolling walker (2 wheeled);None Transfers: Sit to/from Stand Sit to Stand: Supervision         General transfer comment: Pt able to sit to stand with and without RW supervision for safety, pt initally used RW but did not need at end of session    Balance Overall balance assessment:  Needs assistance   Sitting balance-Leahy Scale: Normal Sitting balance - Comments: Pt requrired no assistance for sitting balance   Standing balance support: Bilateral upper extremity supported;During functional activity;No upper extremity supported Standing balance-Leahy Scale: Fair Standing balance comment: Patient is ok without RW, might be beneficial initally just for extra stability                            ADL either performed or assessed with clinical judgement   ADL Overall ADL's : Needs assistance/impaired Eating/Feeding: Independent;Sitting   Grooming: Supervision/safety;Standing   Upper Body Bathing: Supervision/ safety;Standing   Lower Body Bathing: Min guard;Sit to/from stand   Upper Body Dressing : Supervision/safety;Standing   Lower Body Dressing: Min guard;Sit to/from stand   Toilet Transfer: Min guard;RW           Functional mobility during ADLs: Min guard;Rolling walker       Vision Patient Visual Report: No change from baseline       Perception     Praxis      Pertinent Vitals/Pain Pain Assessment: No/denies pain     Hand Dominance Right   Extremity/Trunk Assessment Upper Extremity Assessment Upper Extremity Assessment: Overall WFL for tasks assessed   Lower Extremity Assessment Lower Extremity Assessment: Overall WFL for tasks assessed   Cervical / Trunk Assessment Cervical / Trunk Assessment: Kyphotic   Communication Communication Communication: Other (comment);Expressive difficulties (little HOH. aphasic )   Cognition Arousal/Alertness: Awake/alert Behavior During Therapy: WFL for tasks assessed/performed  Overall Cognitive Status: Within Functional Limits for tasks assessed                                 General Comments: Needed some repetition of multi step commands (though possibly due to hearing).     General Comments  BP 149/84    Exercises Total Joint Exercises Ankle Circles/Pumps: AROM;10  reps;Seated Long Arc Quad: AROM;10 reps;Both;Seated   Shoulder Instructions      Home Living Family/patient expects to be discharged to:: Private residence Living Arrangements: Spouse/significant other Available Help at Discharge: Family;Available PRN/intermittently Type of Home: House Home Access: Stairs to enter [employer] of Steps: 3 Entrance Stairs-Rails: Can reach both Home Layout: Two level;Able to live on main level with bedroom/bathroom Alternate Level Stairs-Number of Steps: flight Alternate Level Stairs-Rails: Can reach both Bathroom Shower/Tub: Teacher, early years/pre: Standard     Home Equipment: Cane - single point;Walker - 2 wheels   Additional Comments: uses cane occasionally when ankle is bothering him from past ankle surgery      Prior Functioning/Environment Level of Independence: Independent        Comments: occasional cane use        OT Problem List: Impaired balance (sitting and/or standing);Decreased cognition      OT Treatment/Interventions: Self-care/ADL training;Therapeutic exercise;Therapeutic activities;Patient/family education;Balance training    OT Goals(Current goals can be found in the care plan section) Acute Rehab OT Goals Patient Stated Goal: home OT Goal Formulation: With patient Time For Goal Achievement: 11/20/19 Potential to Achieve Goals: Good  OT Frequency: Min 2X/week   Barriers to D/C:            Co-evaluation              AM-PAC OT "6 Clicks" Daily Activity     Outcome Measure Help from another person eating meals?: None Help from another person taking care of personal grooming?: A Little Help from another person toileting, which includes using toliet, bedpan, or urinal?: A Little Help from another person bathing (including washing, rinsing, drying)?: A Little Help from another person to put on and taking off regular upper body clothing?: A Little Help from another person to put on and  taking off regular lower body clothing?: A Little 6 Click Score: 19   End of Session Equipment Utilized During Treatment: Rolling walker Nurse Communication: Mobility status  Activity Tolerance: Patient tolerated treatment well Patient left: in chair;with call bell/phone within reach;with chair alarm set  OT Visit Diagnosis: Unsteadiness on feet (R26.81);Other symptoms and signs involving cognitive function;Other symptoms and signs involving the nervous system (R29.898);Cognitive communication deficit (R41.841) Symptoms and signs involving cognitive functions: Cerebral infarction                Time: 1120-1146 OT Time Calculation (min): 26 min Charges:  OT General Charges $OT Visit: 1 Visit OT Evaluation $OT Eval Moderate Complexity: 1 Mod OT Treatments $Self Care/Home Management : 8-22 mins  August Luz, OTR/L    Phylliss Bob 11/06/2019, 12:17 PM

## 2019-11-06 NOTE — Plan of Care (Signed)
  Problem: Coping: Goal: Will verbalize positive feelings about self Outcome: Progressing   Problem: Self-Care: Goal: Ability to participate in self-care as condition permits will improve Outcome: Progressing   Problem: Nutrition: Goal: Risk of aspiration will decrease Outcome: Progressing   Problem: Ischemic Stroke/TIA Tissue Perfusion: Goal: Complications of ischemic stroke/TIA will be minimized Outcome: Progressing

## 2019-11-06 NOTE — Progress Notes (Signed)
PIV removed without complication. Discharge instructions reviewed with patient and his spouse. All questions answered for patient and spouse. Patient and spouse both verbalized understanding.  All personal belongings were collected from the room by spouse. Patient discharged from unit via wheelchair to personal vehicle driven by spouse.

## 2019-11-06 NOTE — Discharge Summary (Signed)
Physician Discharge Summary  THELBERT GARTIN DXA:[id number] DOB: [birth year] DOA: 11/05/2019  PCP: Haywood Pao, MD  Admit date: 11/05/2019 Discharge date: 11/06/2019  Admitted From: Home Discharge disposition: Home   Code Status: DNR  Diet Recommendation: Home   Recommendations for Outpatient Follow-Up:   1. Follow-up with neurologist as outpatient 2. Follow-up with cardiologist as an outpatient.  Ambulatory referral ordered.  Discharge Diagnosis:   Principal Problem:   TIA (transient ischemic attack) Active Problems:   Slurred speech   Essential hypertension   Cerebellar ataxia (HCC)   History of Present Illness / Brief narrative:  Derrick E Kennerlyis an 79 y.o.malewith PMH of HTN, cerebellar ataxia who presented to [facility] ED on 6/18 with a transient episode of aphasia.  Patient stated that the previous day after lunch about 1400 he had a sudden onset of garbled speech and difficult to comprehend a conversation. He was not able to understand what his wife was saying and his wife was unable to understand what he was trying to say. This lasted about 30 minutes and then resolved. He called his neurologist who works at [employer] ( [place]) who recommended for him to come to ED. As an outpatient, patient has been seeing    [facility] Dr. Buck Mam for cerebellar ataxia and dysphagia. Patient at baseline has slurred speech for past 2-3 years-etiology unclear Swallow study did show some difficulties with aspiration when swallowing.  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  Neurology consultation was obtained.  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 -MRA head and neck normal. -Echo with EF 60 to 65%, no cardiac source of embolism. -Lipid panel with HDL  51, LDL 82, pending A1c.. -PT/OT eval obtained.  No follow-up recommended. -Neurology consultation appreciated. -Started on  dual antiplatelet therapy with ASA 81 mg daily and Plavix 75 mg daily for 1 month followed by aspirin 81 mg daily only.  Also switched from pravastatin to Lipitor. -Follow-up with cardiology as an outpatient for 30-day event monitoring.  HTN -Continue ARB  HLD -Lipid panel with HDL 51, LDL 82 -changed Pravachol to Lipitor 40 mg daily  Cerebellar ataxia -Patient is followed by outpatient neurology at [facility] and will need close outpatient f/u -Also with chronic dysarthria and dysphagia (had dietary recommendations in Epic but patient denies)  Mobility: PT/OT evaluation pending Code Status:  Code Status: DNR per chart  Stable for discharge to home today  Subjective:  Seen and examined this morning.  Patient elderly male.  Sitting up in chair.  Not in distress  Discharge Exam:   Vitals:   11/06/19 0430 11/06/19 0630 11/06/19 0750 11/06/19 1152  BP: 106/77 109/75 131/74 (!) 147/79  Pulse: 67 62 64 68  Resp: 18 18 20 17   Temp: 98.3 F (36.8 C) 97.6 F (36.4 C) 98.1 F (36.7 C) 98 F (36.7 C)  TempSrc: Oral Oral Oral Oral  SpO2: 98% 98% 99% 96%  Weight:      Height:        Body mass index is 25.83 kg/m.  General exam: Appears calm and comfortable.  Skin: No rashes, lesions or ulcers. HEENT: Atraumatic, normocephalic, supple neck, no obvious bleeding Lungs: Clear to auscultation bilaterally CVS: Regular rate and rhythm, no murmur GI/Abd soft, nontender, nondistended, bowel sound present CNS: Alert, awake, oriented x3.  Dysarthria and ataxia present baseline.  Psychiatry: Mood appropriate Extremities: No pedal edema, no calf tenderness  Discharge Instructions:  Wound care: None     Discharge Instructions    Ambulatory referral to Cardiology   Complete by: As directed    Needs 30-day event monitoring.      Follow-up Information     [facility] Follow up in 5 week(s).   Why: Office will call you with an appointment. Contact information: [address]     [address] [place] [address] [phone]             Allergies as of 11/06/2019      Reactions   Morphine Hives, Itching   Codeine Hives, Itching   Propoxyphene Itching   DARVOCET   Shrimp [shellfish Allergy] Rash      Medication List    TAKE these medications   acetaminophen 500 MG tablet Commonly known as: TYLENOL Take 500 mg by mouth every 6 (six) hours as needed for moderate pain or headache.   aspirin 81 MG EC tablet Take 1 tablet (81 mg total) by mouth daily. Swallow whole. Start taking on: November 07, 2019   atorvastatin 40 MG tablet Commonly known as: LIPITOR Take 1 tablet (40 mg total) by mouth daily. Start taking on: November 07, 2019   clopidogrel 75 MG tablet Commonly known as: Plavix Take 1 tablet (75 mg total) by mouth daily.   cyanocobalamin 1000 MCG tablet Take 1,000 mcg by mouth daily.   ibuprofen 200 MG tablet Commonly known as: ADVIL Take 200 mg by mouth every 6 (six) hours as needed for headache or moderate pain.   pantoprazole 40 MG tablet Commonly known as: PROTONIX Take 40 mg by mouth daily.   Systane Balance 0.6 % Soln Generic drug: Propylene Glycol Place 1 drop into both eyes daily as needed (burning).   telmisartan 80 MG tablet Commonly known as: MICARDIS Take 80 mg by mouth daily.   triamcinolone cream 0.1 % Commonly known as: KENALOG Apply 1 application topically daily as needed (itching).   Vitamin D-1000 Max St 25 MCG (1000 UT) tablet Generic drug: Cholecalciferol Take 1,000 Units by mouth daily.       Time coordinating discharge: 35 minutes  The results of significant diagnostics from this hospitalization (including imaging, microbiology, ancillary and laboratory) are listed below for reference.    Procedures and Diagnostic Studies:   CT HEAD WO  CONTRAST  Result Date: 11/05/2019 CLINICAL DATA:  79 year old male with onset of slurred speech yesterday, minimally improved. EXAM: CT HEAD WITHOUT CONTRAST TECHNIQUE: Contiguous axial images were obtained from the base of the skull through the vertex without intravenous contrast. COMPARISON:  Brain MRI 03/30/2019 and earlier. FINDINGS: Brain: Patchy periventricular white matter hypodensity appears stable to FLAIR signal findings on prior MRI. Stable cerebral volume. No midline shift, ventriculomegaly, mass effect, evidence of mass lesion, intracranial hemorrhage or evidence of cortically based acute infarction. No cortical encephalomalacia identified. Vascular: Calcified atherosclerosis at the skull base. Indeterminate hyperdensity of a somewhat distal left MCA posterior branch in the sylvian fissure on series 3, image 17 (no prior CT for comparison). No regional cytotoxic edema. Skull: Negative. Sinuses/Orbits: Visualized paranasal sinuses and mastoids are stable and well pneumatized. Other: Postoperative changes to both globes. No acute orbit or scalp soft tissue finding. IMPRESSION: 1. No definite acute intracranial abnormality; there is indeterminate hyperdensity of a relatively distal Left MCA branch in the left sylvian fissure - but no CT changes of  a recent cortical infarct. Therefore favor this is calcified atherosclerosis. 2. 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 Electronically Signed   By: Genevie Ann M.D.   On: 11/05/2019 11:41   MR ANGIO HEAD WO CONTRAST  Result Date: 11/05/2019 CLINICAL DATA:  TIA.  Slurred speech. EXAM: MRI HEAD WITHOUT CONTRAST MRA HEAD WITHOUT CONTRAST MRA NECK WITHOUT CONTRAST TECHNIQUE: Multiplanar, multiecho pulse sequences of the brain and surrounding structures were obtained without intravenous contrast. Angiographic images of the Circle of Willis were obtained using MRA technique without intravenous contrast. Angiographic images of the neck were  obtained using MRA technique without intravenous contrast. Carotid stenosis measurements (when applicable) are obtained utilizing NASCET criteria, using the distal internal carotid diameter as the denominator. COMPARISON:  Head CT 11/05/2019 and MRI 03/30/2019 FINDINGS: MRI HEAD FINDINGS Brain: There is a 3 mm acute cortical infarct in the posterior aspect of the left temporal operculum. No intracranial hemorrhage, mass, midline shift, or extra-axial fluid collection is identified. Patchy and confluent T2 hyperintensities in the cerebral white matter are unchanged from the prior MRI and are nonspecific but compatible with moderate chronic small vessel ischemic disease. Mild chronic small vessel changes are also noted in the pons. There is moderate cerebral atrophy. Vascular: Major intracranial vascular flow voids are preserved. Skull and upper cervical spine: Unremarkable bone marrow signal. Partially visualized anterior cervical spine fusion. Sinuses/Orbits: Unremarkable orbits. Trace right mastoid fluid. Clear paranasal sinuses. Other: None. MRA HEAD FINDINGS The visualized distal vertebral arteries are widely patent to the basilar. The basilar artery is widely patent with motion artifact noted through its proximal and mid portions. Patent PICAs and SCAs are seen bilaterally. There is a large left posterior communicating artery. Both PCAs are patent without evidence of a significant proximal stenosis. The internal carotid arteries are patent from skull base to carotid termini without evidence of significant stenosis within limitations of motion artifact which is most notable through the cavernous segments. ACAs and MCAs are patent without evidence of a proximal branch occlusion or significant proximal stenosis. No aneurysm is identified. MRA NECK FINDINGS There is a standard 3 vessel aortic arch with widely patent arch vessel origins. The common carotid and cervical internal carotid arteries are patent bilaterally  without evidence of significant stenosis or dissection. There is evidence of eccentric atherosclerotic plaque posteriorly at the carotid bulb which does not result in significant stenosis. The vertebral arteries are patent and codominant with antegrade flow bilaterally. No significant vertebral artery stenosis or dissection is evident. IMPRESSION: 1. 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 2. Moderate chronic small vessel ischemic disease. 3. Motion degraded head MRA without evidence of a large or medium vessel occlusion or significant proximal stenosis. 4. Negative neck MRA. Electronically Signed   By: Logan Bores M.D.   On: 11/05/2019 16:09   MR ANGIO NECK WO CONTRAST  Result Date: 11/05/2019 CLINICAL DATA:  TIA.  Slurred speech. EXAM: MRI HEAD WITHOUT CONTRAST MRA HEAD WITHOUT CONTRAST MRA NECK WITHOUT CONTRAST TECHNIQUE: Multiplanar, multiecho pulse sequences of the brain and surrounding structures were obtained without intravenous contrast. Angiographic images of the Circle of Willis were obtained using MRA technique without intravenous contrast. Angiographic images of the neck were obtained using MRA technique without intravenous contrast. Carotid stenosis measurements (when applicable) are obtained utilizing NASCET criteria, using the distal internal carotid diameter as the denominator. COMPARISON:  Head CT 11/05/2019 and MRI 03/30/2019 FINDINGS: MRI HEAD FINDINGS Brain: There is a 3 mm acute cortical infarct in  the posterior aspect of the left temporal operculum. No intracranial hemorrhage, mass, midline shift, or extra-axial fluid collection is identified. Patchy and confluent T2 hyperintensities in the cerebral white matter are unchanged from the prior MRI and are nonspecific but compatible with moderate chronic small vessel ischemic disease. Mild chronic small vessel changes are also noted in the pons. There is moderate cerebral atrophy. Vascular: Major intracranial vascular flow voids are  preserved. Skull and upper cervical spine: Unremarkable bone marrow signal. Partially visualized anterior cervical spine fusion. Sinuses/Orbits: Unremarkable orbits. Trace right mastoid fluid. Clear paranasal sinuses. Other: None. MRA HEAD FINDINGS The visualized distal vertebral arteries are widely patent to the basilar. The basilar artery is widely patent with motion artifact noted through its proximal and mid portions. Patent PICAs and SCAs are seen bilaterally. There is a large left posterior communicating artery. Both PCAs are patent without evidence of a significant proximal stenosis. The internal carotid arteries are patent from skull base to carotid termini without evidence of significant stenosis within limitations of motion artifact which is most notable through the cavernous segments. ACAs and MCAs are patent without evidence of a proximal branch occlusion or significant proximal stenosis. No aneurysm is identified. MRA NECK FINDINGS There is a standard 3 vessel aortic arch with widely patent arch vessel origins. The common carotid and cervical internal carotid arteries are patent bilaterally without evidence of significant stenosis or dissection. There is evidence of eccentric atherosclerotic plaque posteriorly at the carotid bulb which does not result in significant stenosis. The vertebral arteries are patent and codominant with antegrade flow bilaterally. No significant vertebral artery stenosis or dissection is evident. IMPRESSION: 1. 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 2. Moderate chronic small vessel ischemic disease. 3. Motion degraded head MRA without evidence of a large or medium vessel occlusion or significant proximal stenosis. 4. Negative neck MRA. Electronically Signed   By: Logan Bores M.D.   On: 11/05/2019 16:09   MR BRAIN WO CONTRAST  Result Date: 11/05/2019 CLINICAL DATA:  TIA.  Slurred speech. EXAM: MRI HEAD WITHOUT CONTRAST MRA HEAD WITHOUT CONTRAST MRA NECK WITHOUT  CONTRAST TECHNIQUE: Multiplanar, multiecho pulse sequences of the brain and surrounding structures were obtained without intravenous contrast. Angiographic images of the Circle of Willis were obtained using MRA technique without intravenous contrast. Angiographic images of the neck were obtained using MRA technique without intravenous contrast. Carotid stenosis measurements (when applicable) are obtained utilizing NASCET criteria, using the distal internal carotid diameter as the denominator. COMPARISON:  Head CT 11/05/2019 and MRI 03/30/2019 FINDINGS: MRI HEAD FINDINGS Brain: There is a 3 mm acute cortical infarct in the posterior aspect of the left temporal operculum. No intracranial hemorrhage, mass, midline shift, or extra-axial fluid collection is identified. Patchy and confluent T2 hyperintensities in the cerebral white matter are unchanged from the prior MRI and are nonspecific but compatible with moderate chronic small vessel ischemic disease. Mild chronic small vessel changes are also noted in the pons. There is moderate cerebral atrophy. Vascular: Major intracranial vascular flow voids are preserved. Skull and upper cervical spine: Unremarkable bone marrow signal. Partially visualized anterior cervical spine fusion. Sinuses/Orbits: Unremarkable orbits. Trace right mastoid fluid. Clear paranasal sinuses. Other: None. MRA HEAD FINDINGS The visualized distal vertebral arteries are widely patent to the basilar. The basilar artery is widely patent with motion artifact noted through its proximal and mid portions. Patent PICAs and SCAs are seen bilaterally. There is a large left posterior communicating artery. Both PCAs are patent without evidence of a  significant proximal stenosis. The internal carotid arteries are patent from skull base to carotid termini without evidence of significant stenosis within limitations of motion artifact which is most notable through the cavernous segments. ACAs and MCAs are patent  without evidence of a proximal branch occlusion or significant proximal stenosis. No aneurysm is identified. MRA NECK FINDINGS There is a standard 3 vessel aortic arch with widely patent arch vessel origins. The common carotid and cervical internal carotid arteries are patent bilaterally without evidence of significant stenosis or dissection. There is evidence of eccentric atherosclerotic plaque posteriorly at the carotid bulb which does not result in significant stenosis. The vertebral arteries are patent and codominant with antegrade flow bilaterally. No significant vertebral artery stenosis or dissection is evident. IMPRESSION: 1. 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 2. Moderate chronic small vessel ischemic disease. 3. Motion degraded head MRA without evidence of a large or medium vessel occlusion or significant proximal stenosis. 4. Negative neck MRA. Electronically Signed   By: Logan Bores M.D.   On: 11/05/2019 16:09   ECHOCARDIOGRAM COMPLETE  Result Date: 11/05/2019    ECHOCARDIOGRAM REPORT   Patient Name:   JADARIAN MCKAY Date of Exam: 11/05/2019 Medical Rec #:  [id number]       Height:       70.0 in Accession #:    [id number]      Weight:       180.0 lb Date of Birth:  January 30, 1941        BSA:          1.996 m Patient Age:    59 years        BP:           157/76 mmHg Patient Gender: M               HR:           64 bpm. Exam Location:  Inpatient Procedure: 2D Echo Indications:    TIA  History:        Patient has no prior history of Echocardiogram examinations.                 Risk Factors:Hypertension.  Sonographer:    Johny Chess Referring Phys: [place]  1. Left ventricular ejection fraction, by estimation, is 60 to 65%. The left ventricle has normal function. The left ventricle has no regional wall motion abnormalities. Left ventricular diastolic parameters were normal.  2. Right ventricular systolic function is normal. The right ventricular size is normal. There is  normal pulmonary artery systolic pressure.  3. The mitral valve is normal in structure. No evidence of mitral valve regurgitation. No evidence of mitral stenosis.  4. The aortic valve is normal in structure. Aortic valve regurgitation is not visualized. No aortic stenosis is present.  5. Aortic dilatation noted. There is borderline dilatation of the aortic root.  6. The inferior vena cava is normal in size with greater than 50% respiratory variability, suggesting right atrial pressure of 3 mmHg. FINDINGS  Left Ventricle: Left ventricular ejection fraction, by estimation, is 60 to 65%. The left ventricle has normal function. The left ventricle has no regional wall motion abnormalities. The left ventricular internal cavity size was normal in size. There is  no left ventricular hypertrophy. Left ventricular diastolic parameters were normal. Normal left ventricular filling pressure. Right Ventricle: The right ventricular size is normal. No increase in right ventricular wall thickness. Right ventricular systolic function is normal. There is normal pulmonary  artery systolic pressure. The tricuspid regurgitant velocity is 2.30 m/s, and  with an assumed right atrial pressure of 3 mmHg, the estimated right ventricular systolic pressure is 69.4 mmHg. Left Atrium: Left atrial size was normal in size. Right Atrium: Right atrial size was normal in size. Pericardium: There is no evidence of pericardial effusion. Mitral Valve: The mitral valve is normal in structure. Normal mobility of the mitral valve leaflets. No evidence of mitral valve regurgitation. No evidence of mitral valve stenosis. Tricuspid Valve: The tricuspid valve is normal in structure. Tricuspid valve regurgitation is not demonstrated. No evidence of tricuspid stenosis. Aortic Valve: The aortic valve is normal in structure. Aortic valve regurgitation is not visualized. No aortic stenosis is present. Pulmonic Valve: The pulmonic valve was normal in structure.  Pulmonic valve regurgitation is not visualized. No evidence of pulmonic stenosis. Aorta: Aortic dilatation noted. There is borderline dilatation of the aortic root. Venous: The inferior vena cava is normal in size with greater than 50% respiratory variability, suggesting right atrial pressure of 3 mmHg. IAS/Shunts: No atrial level shunt detected by color flow Doppler.  LEFT VENTRICLE PLAX 2D LVIDd:         4.70 cm  Diastology LVIDs:         3.10 cm  LV e' lateral:   11.40 cm/s LV PW:         0.70 cm  LV E/e' lateral: 6.2 LV IVS:        0.90 cm  LV e' medial:    8.92 cm/s LVOT diam:     2.00 cm  LV E/e' medial:  8.0 LV SV:         69 LV SV Index:   35 LVOT Area:     3.14 cm  RIGHT VENTRICLE             IVC RV S prime:     12.90 cm/s  IVC diam: 1.10 cm TAPSE (M-mode): 2.3 cm LEFT ATRIUM             Index       RIGHT ATRIUM           Index LA diam:        3.70 cm 1.85 cm/m  RA Area:     16.50 cm LA Vol (A2C):   54.9 ml 27.51 ml/m RA Volume:   39.20 ml  19.64 ml/m LA Vol (A4C):   64.5 ml 32.32 ml/m LA Biplane Vol: 60.0 ml 30.06 ml/m  AORTIC VALVE LVOT Vmax:   94.80 cm/s LVOT Vmean:  58.600 cm/s LVOT VTI:    0.220 m  AORTA Ao Root diam: 4.10 cm Ao Asc diam:  3.40 cm MITRAL VALVE               TRICUSPID VALVE MV Area (PHT): 3.99 cm    TR Peak grad:   21.2 mmHg MV Decel Time: 190 msec    TR Vmax:        230.00 cm/s MV E velocity: 71.00 cm/s MV A velocity: 71.70 cm/s  SHUNTS MV E/A ratio:  0.99        Systemic VTI:  0.22 m                            Systemic Diam: 2.00 cm Dani Gobble Croitoru MD Electronically signed by Sanda Klein MD Signature Date/Time: 11/05/2019/5:44:56 PM    Final      Labs:   Basic Metabolic Panel:  Recent Labs  Lab 11/05/19 0959 11/05/19 1013  NA 139 141  K 4.1 4.1  CL 107 105  CO2 22  --   GLUCOSE 96 99  BUN 18 20  CREATININE 1.17 1.10  CALCIUM 9.2  --    GFR Estimated Creatinine Clearance: 56.2 mL/min (by C-G formula based on SCr of 1.1 mg/dL). Liver Function Tests: Recent  Labs  Lab 11/05/19 0959  AST 18  ALT 20  ALKPHOS 27*  BILITOT 1.1  PROT 6.9  ALBUMIN 4.1   No results for input(s): LIPASE, AMYLASE in the last 168 hours. No results for input(s): AMMONIA in the last 168 hours. Coagulation profile Recent Labs  Lab 11/05/19 0959  INR 1.1    CBC: Recent Labs  Lab 11/05/19 0959 11/05/19 1013  WBC 6.7  --   NEUTROABS 3.3  --   HGB 14.2 14.3  HCT 43.4 42.0  MCV 98.0  --   PLT 372  --    Cardiac Enzymes: No results for input(s): CKTOTAL, CKMB, CKMBINDEX, TROPONINI in the last 168 hours. BNP: Invalid input(s): POCBNP CBG: Recent Labs  Lab 11/05/19 1024  GLUCAP 92   D-Dimer No results for input(s): DDIMER in the last 72 hours. Hgb A1c Recent Labs    11/06/19 1111  HGBA1C 5.6   Lipid Profile Recent Labs    11/05/19 0959  CHOL 165  HDL 51  LDLCALC 82  TRIG 161*  CHOLHDL 3.2   Thyroid function studies Recent Labs    11/05/19 1625  TSH 2.024   Anemia work up No results for input(s): VITAMINB12, FOLATE, FERRITIN, TIBC, IRON, RETICCTPCT in the last 72 hours. Microbiology Recent Results (from the past 240 hour(s))  SARS Coronavirus 2 by RT PCR (hospital order, performed in [facility] hospital lab) Nasopharyngeal Nasopharyngeal Swab     Status: None   Collection Time: 11/05/19  6:18 PM   Specimen: Nasopharyngeal Swab  Result Value Ref Range Status   SARS Coronavirus 2 NEGATIVE NEGATIVE Final    Comment: (NOTE) SARS-CoV-2 target nucleic acids are NOT DETECTED.  The SARS-CoV-2 RNA is generally detectable in upper and lower respiratory specimens during the acute phase of infection. The lowest concentration of SARS-CoV-2 viral copies this assay can detect is 250 copies / mL. A negative result does not preclude SARS-CoV-2 infection and should not be used as the sole basis for treatment or other patient management decisions.  A negative result may occur with improper specimen collection / handling, submission of specimen  other than nasopharyngeal swab, presence of viral mutation(s) within the areas targeted by this assay, and inadequate number of viral copies (<250 copies / mL). A negative result must be combined with clinical observations, patient history, and epidemiological information.  Fact Sheet for Patients:   StrictlyIdeas.no  Fact Sheet for Healthcare Providers: BankingDealers.co.za  This test is not yet approved or  cleared by the [place] FDA and has been authorized for detection and/or diagnosis of SARS-CoV-2 by FDA under an Emergency Use Authorization (EUA).  This EUA will remain in effect (meaning this test can be used) for the duration of the COVID-19 declaration under Section 564(b)(1) of the Act, 21 U.S.C. section 360bbb-3(b)(1), unless the authorization is terminated or revoked sooner.  Performed at [facility], [place] [address] [place], [place] [address]     Please note: You were cared for by a hospitalist during your hospital stay. Once you are discharged, your primary care physician will handle any further medical issues.  Please note that NO REFILLS for any discharge medications will be authorized once you are discharged, as it is imperative that you return to your primary care physician (or establish a relationship with a primary care physician if you do not have one) for your post hospital discharge needs so that they can reassess your need for medications and monitor your lab values.  Signed: Marlowe Aschoff Demarques Pilz  [facility] 11/06/2019, 3:56 PM

## 2019-11-06 NOTE — Evaluation (Signed)
Physical Therapy Evaluation Patient Details Name: Derrick White MRN: [id number] DOB: 01-17-1941 Today's Date: 11/06/2019   History of Present Illness  79 y/o admitted on 11/05/19 with a transient episode of aphasia.  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 PMH HTN; and cerebellar ataxia along with oropharyngeal dysphagia.  Clinical Impression  Pt presents with an overall decrease in functional mobility and balance secondary to above. PTA, pt lived with wife was independent and enjoyed being active. Per request educated pt on safe walking program and LE therex to assist with strengthing BLE. Today, pt able to complete bed mobility and transfer supervision. Pt able to amb with and without RW with min guard for safety. Discussed d/c recommendations for home with no follow up services. Pt would benefit from continued acute PT services to maximize functional mobility and independence prior to d/c home.     Follow Up Recommendations No PT follow up    Equipment Recommendations  None recommended by PT    Recommendations for Other Services       Precautions / Restrictions Precautions Precautions: Fall Restrictions Weight Bearing Restrictions: No      Mobility  Bed Mobility Overal bed mobility: Needs Assistance Bed Mobility: Sit to Supine       Sit to supine: Supervision   General bed mobility comments: pt able to sit EOB with [facility] raised supervision for safety, pt able to scoot EOB supervision for safety  Transfers Overall transfer level: Needs assistance Equipment used: Rolling walker (2 wheeled);None Transfers: Sit to/from Stand Sit to Stand: Supervision         General transfer comment: Pt able to sit to stand with and without RW supervision for safety, pt initally used RW but did not need at end of session  Ambulation/Gait Ambulation/Gait assistance: Min guard Gait Distance (Feet): 120 Feet Assistive device: Rolling walker (2 wheeled);None Gait  Pattern/deviations: Step-through pattern;Decreased stride length;Wide base of support     General Gait Details: Pt amb 173ft with RW min guard assist for safety, no LOB. Pt amb from bed to chair 12 feet with min guard without RW and no LOB or unsteadiness.  Stairs            Wheelchair Mobility    Modified Rankin (Stroke Patients Only) Modified Rankin (Stroke Patients Only) Pre-Morbid Rankin Score: No symptoms Modified Rankin: Moderately severe disability     Balance Overall balance assessment: Needs assistance   Sitting balance-Leahy Scale: Normal Sitting balance - Comments: Pt requrired no assistance for sitting balance     Standing balance-Leahy Scale: Fair Standing balance comment: Pt able to amb without RW with min guard for safety.                             Pertinent Vitals/Pain      Home Living Family/patient expects to be discharged to:: Private residence Living Arrangements: Spouse/significant other Available Help at Discharge: Family;Available PRN/intermittently Type of Home: House Home Access: Stairs to enter Entrance Stairs-Rails: Can reach both Entrance Stairs-Number of Steps: 3 Home Layout: Two level;Able to live on main level with bedroom/bathroom Home Equipment: Cane - single point Additional Comments: uses cane occasionally when ankle is bothering him from past ankle surgery    Prior Function Level of Independence: Independent         Comments: occasional cane use     Hand Dominance        Extremity/Trunk Assessment   Upper Extremity Assessment  Upper Extremity Assessment: Overall WFL for tasks assessed    Lower Extremity Assessment Lower Extremity Assessment: Overall WFL for tasks assessed    Cervical / Trunk Assessment Cervical / Trunk Assessment: Kyphotic  Communication   Communication: No difficulties  Cognition Arousal/Alertness: Awake/alert Behavior During Therapy: WFL for tasks assessed/performed Overall  Cognitive Status: Within Functional Limits for tasks assessed                                 General Comments: Had slight slurred speech to start that cleared up within several minutes of inital intake      General Comments General comments (skin integrity, edema, etc.): No abnormal skin integument noted    Exercises Total Joint Exercises Ankle Circles/Pumps: AROM;10 reps;Seated Long Arc Quad: AROM;10 reps;Both;Seated   Assessment/Plan    PT Assessment Patient needs continued PT services  PT Problem List Decreased balance;Decreased knowledge of use of DME;Decreased knowledge of precautions       PT Treatment Interventions DME instruction;Therapeutic activities;Gait training;Therapeutic exercise;Patient/family education;Stair training;Balance training;Functional mobility training;Neuromuscular re-education    PT Goals (Current goals can be found in the Care Plan section)  Acute Rehab PT Goals Patient Stated Goal: home PT Goal Formulation: With patient Time For Goal Achievement: 11/20/19 Potential to Achieve Goals: Good    Frequency Min 4X/week   Barriers to discharge   no barriers noted    Co-evaluation               AM-PAC PT "6 Clicks" Mobility  Outcome Measure Help needed turning from your back to your side while in a flat bed without using bedrails?: None Help needed moving from lying on your back to sitting on the side of a flat bed without using bedrails?: None Help needed moving to and from a bed to a chair (including a wheelchair)?: A Little Help needed standing up from a chair using your arms (e.g., wheelchair or bedside chair)?: A Little Help needed to walk in hospital room?: A Little Help needed climbing 3-5 steps with a railing? : A Little 6 Click Score: 20    End of Session Equipment Utilized During Treatment: Gait belt Activity Tolerance: Patient tolerated treatment well Patient left: in chair;with nursing/sitter in room;with chair  alarm set Nurse Communication: Mobility status PT Visit Diagnosis: Unsteadiness on feet (R26.81);Other abnormalities of gait and mobility (R26.89);Other symptoms and signs involving the nervous system (R29.898)    Time: 1540-0867 PT Time Calculation (min) (ACUTE ONLY): 19 min   Charges:   PT Evaluation $PT Eval Moderate Complexity: 1 Mod          Genevie Elman SPT 11/06/2019   Rolland Porter 11/06/2019, 10:59 AM

## 2019-11-06 NOTE — Progress Notes (Signed)
STROKE TEAM PROGRESS NOTE   HISTORY OF PRESENT ILLNESS (per record) Derrick White is an 79 y.o. male  With PMH HTN, cerebellar ataxia who presented to [facility] ED with a transient episode of aphasia. Patient stated that yesterday after lunch about 1400 he had a sudden onset of garbled speech. He was not able to understand what his wife was saying and his wife was unable  To understand what he was trying to say. This lasted about 30 minutes and then resolved. He called his neurologist who works at [employer] ( [place]) who recommended for him to come to ED. Patient denies CP, SOB, room spinning, dizziness, blurred vision, drug use, smoking, ASA. Endorses social drinking.  Patient at baseline has slurred speech for past 2-3 years-etiology unclear and under investigation by the neurology team at [facility]. ED course:  CTH: no hemorrhage MRA head and neck: no LVO MRI: 3 mm acute posterior left temporal infarct Patient seen by [facility] neurologist Dr. Buck Mam for cerebellar ataxia and dysphagia. swallow study did show some difficulties with aspiration when swallowing. Modified Rankin: Rankin Score=2 NIHSS:1   INTERVAL HISTORY The patient's wife is at the bedside.  The patient seems to be at baseline.  He has had significant dysarthria for the last year.  This is being evaluated by neurologist at [facility].  This has not changed.  His episode of what appears to be aphasia has resolved.    OBJECTIVE Vitals:   11/06/19 0430 11/06/19 0630 11/06/19 0750 11/06/19 1152  BP: 106/77 109/75 131/74 (!) 147/79  Pulse: 67 62 64 68  Resp: 18 18 20 17   Temp: 98.3 F (36.8 C) 97.6 F (36.4 C) 98.1 F (36.7 C) 98 F (36.7 C)  TempSrc: Oral Oral Oral Oral  SpO2: 98% 98% 99% 96%  Weight:      Height:        CBC:  Recent Labs  Lab 11/05/19 0959 11/05/19 1013  WBC 6.7  --   NEUTROABS 3.3  --   HGB 14.2 14.3  HCT 43.4 42.0  MCV 98.0  --   PLT 372  --     Basic Metabolic Panel:  Recent Labs   Lab 11/05/19 0959 11/05/19 1013  NA 139 141  K 4.1 4.1  CL 107 105  CO2 22  --   GLUCOSE 96 99  BUN 18 20  CREATININE 1.17 1.10  CALCIUM 9.2  --     Lipid Panel:     Component Value Date/Time   CHOL 165 11/05/2019 0959   TRIG 161 (H) 11/05/2019 0959   HDL 51 11/05/2019 0959   CHOLHDL 3.2 11/05/2019 0959   VLDL 32 11/05/2019 0959   LDLCALC 82 11/05/2019 0959   HgbA1c:  Lab Results  Component Value Date   HGBA1C 5.6 11/06/2019   Urine Drug Screen: No results found for: LABOPIA, COCAINSCRNUR, LABBENZ, AMPHETMU, THCU, LABBARB  Alcohol Level No results found for: [place]  CT HEAD WO CONTRAST 11/05/2019 IMPRESSION:  1. No definite acute intracranial abnormality; there is indeterminate hyperdensity of a relatively distal Left MCA branch in the left sylvian fissure - but no CT changes of a recent cortical infarct. Therefore favor this is calcified atherosclerosis.  2. 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  MR BRAIN WO CONTRAST MR ANGIO HEAD WO CONTRAST MR ANGIO NECK WO CONTRAST 11/05/2019 IMPRESSION:  1. 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  2. Moderate chronic small vessel ischemic disease.  3. Motion degraded  head MRA without evidence of a large or medium vessel occlusion or significant proximal stenosis.  4. Negative neck MRA.    ECHOCARDIOGRAM COMPLETE 11/05/2019 IMPRESSIONS   1. Left ventricular ejection fraction, by estimation, is 60 to 65%. The left ventricle has normal function. The left ventricle has no regional wall motion abnormalities. Left ventricular diastolic parameters were normal.   2. Right ventricular systolic function is normal. The right ventricular size is normal. There is normal pulmonary artery systolic pressure.   3. The mitral valve is normal in structure. No evidence of mitral valve regurgitation. No evidence of mitral stenosis.   4. The aortic valve is normal in structure. Aortic valve regurgitation is  not visualized. No aortic stenosis is present.   5. Aortic dilatation noted. There is borderline dilatation of the aortic root.   6. The inferior vena cava is normal in size with greater than 50% respiratory variability, suggesting right atrial pressure of 3 mmHg.    ECG - SR rate 73 BPM. (See cardiology reading for complete details)   PHYSICAL EXAM Blood pressure (!) 147/79, pulse 68, temperature 98 F (36.7 C), temperature source Oral, resp. rate 17, height 5\' 10"  (1.778 m), weight 81.6 kg, SpO2 96 %.    GENERAL: He is doing well at this time.  HEENT: Neck is supple; head is atraumatic.  He does class II malocclusion.  ABDOMEN: soft  EXTREMITIES: No edema   BACK: Normal  SKIN: Normal by inspection.    MENTAL STATUS: Alert and oriented. Speech -moderate dysarthria is noted, language and cognition are generally intact. Judgment and insight normal.   CRANIAL NERVES: Pupils are equal, round and reactive to light and accomodation; extra ocular movements are full, there is no significant nystagmus; visual fields are full; upper and lower facial muscles are normal in strength and symmetric, there is no flattening of the nasolabial folds; tongue is midline; uvula is midline; shoulder elevation is normal.  MOTOR: Normal tone, bulk and strength; no pronator drift.  COORDINATION: Left finger to nose is normal, right finger to nose is normal, No rest tremor; no intention tremor; no postural tremor; no bradykinesia.  SENSATION: Normal to light touch and pain.      ASSESSMENT/PLAN Mr. Derrick White is a 79 y.o. male with history of HTN, cerebellar ataxia and swallowing difficulties who presented to [facility] ED with a transient episode of aphasia.  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  The patient will be placed on dual antiplatelet agents for 1 month.  Afterwards aspirin is recommended.   A 30-day event monitor is recommended.  Resultant back to baseline.  Code Stroke CT Head - not ordered   CT head -  No definite acute intracranial abnormality; there is indeterminate hyperdensity of a relatively distal Left MCA branch in the left sylvian fissure - but no CT changes of a recent cortical infarct. Therefore favor this is calcified atherosclerosis. 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  MRI head -  3 mm acute posterior left temporal infarct -limited to the gyrus. Moderate chronic small vessel ischemic disease.   MRA head - Motion degraded head MRA without evidence of a large or medium vessel occlusion or significant proximal stenosis. Negative neck MRA.   CTA H&N - not ordered  CT Perfusion - not ordered  Carotid Doppler - MRA neck performed - carotid dopplers not indicated.  2D Echo - EF 60 - 65%. No cardiac source of emboli identified.   Sars Corona Virus 2 - negative  LDL - 82  HgbA1c - 5.6  UDS - not ordered  VTE prophylaxis - Lovenox Diet  Diet Order            Diet Heart Room service appropriate? Yes; Fluid consistency: Thin  Diet effective ____                 No antithrombotic prior to admission, now on aspirin 81 mg daily.  He will be started on dual antiplatelet agents.  Patient counseled to be compliant with his antithrombotic medications  Ongoing aggressive stroke risk factor management  Therapy recommendations:  No PT or OT f/u recommended  Disposition: Home  Hypertension  Home BP meds: Micardis  Current BP meds: none   Stable . Permissive hypertension (OK if < 220/120) but gradually normalize in 5-7 days . Long-term BP goal normotensive  Hyperlipidemia  Home Lipid lowering medication: none  LDL 82, goal < 70  Current lipid lowering medication: Lipitor 40 mg daily   Continue statin at discharge  Other Stroke Risk Factors  Advanced age  Former cigarette smoker - quit  Previous ETOH  use  Other Active Problems  Code status - DNR  Hospital day # 0    To contact Stroke Continuity provider, please refer to [email or web address] After hours, contact General Neurology

## 2019-11-06 NOTE — Care Management Obs Status (Signed)
MEDICARE OBSERVATION STATUS NOTIFICATION   Patient Details  Name: Derrick White MRN: [id number] Date of Birth: Oct 13, 1940   Medicare Observation Status Notification Given:  Yes    Carles Collet, RN 11/06/2019, 4:24 PM

## 2019-11-11 NOTE — Addendum Note (Signed)
Encounter addended by: Malcolm Metro, RT on: 11/11/2019 8:45 AM  Actions taken: Imaging Exam ended

## 2019-11-29 ENCOUNTER — Other Ambulatory Visit: Payer: Self-pay

## 2019-11-29 ENCOUNTER — Ambulatory Visit: Payer: Medicare Other | Attending: Internal Medicine

## 2019-11-29 ENCOUNTER — Telehealth: Payer: Self-pay

## 2019-11-29 DIAGNOSIS — G459 Transient cerebral ischemic attack, unspecified: Secondary | ICD-10-CM | POA: Diagnosis not present

## 2019-11-29 DIAGNOSIS — R1312 Dysphagia, oropharyngeal phase: Secondary | ICD-10-CM | POA: Insufficient documentation

## 2019-11-29 DIAGNOSIS — R471 Dysarthria and anarthria: Secondary | ICD-10-CM | POA: Insufficient documentation

## 2019-11-29 DIAGNOSIS — M25572 Pain in left ankle and joints of left foot: Secondary | ICD-10-CM

## 2019-11-29 DIAGNOSIS — R2681 Unsteadiness on feet: Secondary | ICD-10-CM | POA: Diagnosis not present

## 2019-11-29 DIAGNOSIS — M6281 Muscle weakness (generalized): Secondary | ICD-10-CM | POA: Insufficient documentation

## 2019-11-29 DIAGNOSIS — R270 Ataxia, unspecified: Secondary | ICD-10-CM | POA: Diagnosis not present

## 2019-11-29 NOTE — Telephone Encounter (Signed)
Called wife back. Spoke with pt. Rescheduled appt to 12/21/19 at 1:30pm instead. He will be out of town the week of 12/15/19

## 2019-11-29 NOTE — Therapy (Signed)
[facility] [address] [place], [place], [address] Phone: [phone]   Fax:  [phone]  Physical Therapy Evaluation  Patient Details  Name: Derrick White MRN: [id number] Date of Birth: 21-May-1940 Referring Provider (PT): Dr. Domenick Gong   Encounter Date: 11/29/2019   PT End of Session - 11/29/19 1223    Visit Number 1    Number of Visits 13    Date for PT Re-Evaluation 01/10/20    Authorization Type Medicare    Progress Note Due on Visit 10    PT Start Time 1100    PT Stop Time 1145    PT Time Calculation (min) 45 min    Equipment Utilized During Treatment Gait belt    Activity Tolerance Patient tolerated treatment well    Behavior During Therapy [facility] for tasks assessed/performed           Past Medical History:  Diagnosis Date  . Cerebellar ataxia ([place])   . History of hiatal hernia   . History of kidney stones   . Hypertension     Past Surgical History:  Procedure Laterality Date  . APPENDECTOMY     1940s  . [facility]  . FRACTURE SURGERY     Left ankle, sx x4  . INGUINAL HERNIA REPAIR Right 03/05/2019   Procedure: OPEN RIGHT INGUINAL HERNIA REPAIR WITH MESH;  Surgeon: Clovis Riley, MD;  Location: [facility];  Service: General;  Laterality: Right;  . KNEE SURGERY Left 1985  . SHOULDER SURGERY Left   . TONSILLECTOMY     1945    There were no vitals filed for this visit.    Subjective Assessment - 11/29/19 1206    Subjective Pt reports he was c/o slurred speech that started around 3pm on 11/04/19. Went to ED on 11/05/19 but by that time his speech had improved. Speech symptoms only lasted for about 30 minutes. Pt reports he has noticed pronounced changes in his speech since the stroke. Patient seen by [facility] neurologist Dr. Buck Mam for cerebellar ataxia and dysphagia. He broke his L ankle about 6 years ago which required surgery. He had decompression of peroneal nerve  afterwards. He walks with cane occasionally to help with ankle pain.    Pertinent History HTN, cerebellar ataxia, oropharyngeal dysphagia, Sialorrhea, Chronic R ankle pain    Limitations Walking;Other (comment)   stairs   How long can you sit comfortably? no issues    How long can you stand comfortably? 10 min    How long can you walk comfortably? 200 yards    Diagnostic tests 11/05/19: IMPRESSION:1. 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 Moderate chronic small vessel ischemic disease.3. Motion degraded head MRA without evidence of a large or mediumvessel occlusion or significant proximal stenosis.4. Negative neck MRA.  IMPRESSION:1. 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 Mild spinal stenosis and mild-to-moderate neural foraminalstenosis at C6-7, stable to minimally progressed.    Patient Stated Goals walk 1 mile comfortably, come down stairs better    Currently in Pain? Yes    Pain Score 2     Pain Location Ankle    Pain Orientation Left    Pain Descriptors / Indicators Aching    Pain Type Chronic pain    Pain Onset More than a month ago    Pain Frequency Intermittent    Aggravating Factors  walking, stairs  Pain Relieving Factors position change    Effect of Pain on Daily Activities dififculty with walking, stairs              [facility] PT Assessment - 11/29/19 1208      Assessment   Medical Diagnosis TIA, Ataxia    Referring Provider (PT) Dr. Domenick Gong    Onset Date/Surgical Date 11/05/19    Hand Dominance Right    Prior Therapy inpatient      Precautions   Precautions Fall      Restrictions   Weight Bearing Restrictions No      Balance Screen   Has the patient fallen in the past 6 months No    Has the patient had a decrease in activity level because of a fear of falling?  No    Is the patient reluctant to leave their home because of a fear of  falling?  No      Home Ecologist residence    Living Arrangements Spouse/significant other    Available Help at Discharge Family    Type of [place] to enter    Entrance Stairs-Number of Steps 3    Entrance Stairs-Rails Can reach both;Right;Left    Home Layout Two level   pt lives on main level with bedroom and bathroom    Alternate Level Stairs-Number of Steps 10    Alternate Level Stairs-Rails Right    [place] - single point      Prior Function   Level of [place] Retired    Biomedical scientist walk      Cognition   Overall Cognitive Status Within Functional Limits for tasks assessed    Attention Focused    Focused Attention Appears intact    Memory Appears intact    Awareness Appears intact    Problem Solving Appears intact      ROM / Strength   AROM / PROM / Strength Strength      Strength   Strength Assessment Site Ankle    Right/Left Ankle Right;Left    Right Ankle Dorsiflexion 5/5    Left Ankle Dorsiflexion 5/5      Transfers   Transfers Sit to Stand   pushes against back of chair, multiple tx without HHA     Ambulation/Gait   Ambulation/Gait Yes    Ambulation/Gait Assistance 7: Independent    Ambulation Distance (Feet) 220 Feet    Assistive device None;Other (Comment)   2 walking sticks   Gait Pattern Decreased stride length;Decreased hip/knee flexion - right;Decreased hip/knee flexion - left;Ataxic;Decreased trunk rotation;Poor foot clearance - left    Ambulation Surface Level;Indoor      Standardized Balance Assessment   Standardized Balance Assessment Berg Balance Test;Timed Up and Go Test;Five Times Sit to Stand;10 meter walk test    10 Meter Walk 13.66 seconds   0.52m/s     Berg Balance Test   Sit to Stand Able to stand without using hands and stabilize independently    Standing Unsupported Able to stand safely 2 minutes    Sitting with Back Unsupported  but Feet Supported on Floor or Stool Able to sit safely and securely 2 minutes    Stand to Sit Sits safely with minimal use of hands    Transfers Able to transfer safely, minor use of hands    Standing Unsupported with Eyes Closed Able to stand 10 seconds safely  Standing Unsupported with Feet Together Able to place feet together independently and stand 1 minute safely    From Standing, Reach Forward with Outstretched Arm Can reach confidently >25 cm (10")    From Standing Position, Pick up Object from Floor Able to pick up shoe safely and easily    From Standing Position, Turn to Look Behind Over each Shoulder Looks behind from both sides and weight shifts well    Turn 360 Degrees Able to turn 360 degrees safely but slowly    Standing Unsupported, Alternately Place Feet on Step/Stool Able to stand independently and safely and complete 8 steps in 20 seconds    Standing Unsupported, One Foot in Front Able to place foot tandem independently and hold 30 seconds    Standing on One Leg Able to lift leg independently and hold 5-10 seconds    Total Score 53    Berg comment: 53/56 low fall risk      Timed Up and Go Test   Normal TUG (seconds) 16    Manual TUG (seconds) 20   with carrying 15lbs in R hand; 19s c 15lbs in L UE                     Objective measurements completed on examination: See above findings.               PT Education - 11/29/19 1214    Education Details Initiate walking program by walking 20 min with cane, 3x/week to work on improving walking endurance    Person(s) Educated Patient    Methods Explanation    Comprehension Verbalized understanding            PT Short Term Goals - 11/29/19 1215      PT SHORT TERM GOAL #1   Title Patient will be able to perform sit to stand without HHA without pushing knees against chair and with one trial only    Baseline pushes back of knee agianst chair, takes several tries    Time 3    Period Weeks     Status New    Target Date 12/20/19      PT SHORT TERM GOAL #2   Title Patient will be able to ambulate 25 min with straight cane for 3x/week to improve compliance with walking program    Baseline 200 yards    Time 3    Period Weeks    Status New    Target Date 12/20/19      PT SHORT TERM GOAL #3   Title Patient will be able to come down stairs with reciprocal gait pattern and use of one rail to improve ability to come down stairs without significant ankle pain    Baseline step to pattern, increased L aknle pain with reciprocal gait pattern    Time 3    Period Weeks    Status New    Target Date 12/20/19             PT Long Term Goals - 11/29/19 1218      PT LONG TERM GOAL #1   Title Patient will demo <12 seconds on normal TUG to improve functional mobility and transfers    Baseline 16 seconds (normal); 20 seconds with carrying 15lbs in R hand; 19 sec with carrying 15lbs in L Hand    Time 6    Period Weeks    Status New    Target Date 01/10/20  PT LONG TERM GOAL #2   Title Patient will be able to ambulate for 40 min for 2x/week to improve walking endurance    Baseline 200 yards    Time 6    Period Weeks    Status New    Target Date 12/20/19                  Plan - 11/29/19 1219    Clinical Impression Statement Patient is a 79 y.o. male who was seen today for physical therapy evaluation and treatment for gait instability and balance disorder from recent TIA and cerebellar ataxia. Patient's objective findings include gait abnormalities, decreased functional mobility, decreased functional strnegth with transfers, decreased walking endurance and L aknle pain. Patient will benefit from skilled PT to address these impairments and improve function with walking, transfers and stairs.    Personal Factors and Comorbidities Age;Comorbidity 2;Past/Current Experience;Time since onset of injury/illness/exacerbation    Comorbidities HTN, cerebellar ataxia, oropharyngeal  dysphagia, Sialorrhea, Chronic R ankle pain    Examination-Activity Limitations Carry;Lift;Squat;Stairs;Stand;Transfers    Examination-Participation Restrictions Community Activity;Laundry;Shop;Yard Work    Stability/Clinical Decision Making Evolving/Moderate complexity    Clinical Decision Making Moderate    Rehab Potential Good    PT Treatment/Interventions ADLs/Self Care Home Management;Gait training;Stair training;Functional mobility training;Therapeutic exercise;Therapeutic activities;Balance training;Neuromuscular re-education;Manual techniques;Patient/family education;Passive range of motion;Visual/perceptual remediation/compensation;Joint Manipulations    PT Next Visit Plan 6 MWT, work on sit to stand without HHA, coming down stairs with rail    PT Home Exercise Plan Walking program: walk 20 min with cane 3x/week    Consulted and Agree with Plan of Care Patient           Patient will benefit from skilled therapeutic intervention in order to improve the following deficits and impairments:  Abnormal gait, Decreased balance, Decreased activity tolerance, Decreased endurance, Decreased coordination, Decreased mobility, Decreased range of motion, Decreased strength, Difficulty walking, Postural dysfunction, Pain  Visit Diagnosis: Ataxia  TIA (transient ischemic attack)  Unsteadiness on feet  Muscle weakness (generalized)  Pain in left ankle and joints of left foot     Problem List Patient Active Problem List   Diagnosis Date Noted  . TIA (transient ischemic attack) 11/05/2019  . Essential hypertension 11/05/2019  . Cerebellar ataxia ([place]) 11/05/2019  . 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 C6 radiculopathy 05/04/2019  . Pain due to onychomycosis of toenails of both feet 04/28/2019  . Worsened handwriting 03/16/2019  . Gait disturbance 03/16/2019  . Slurred speech 03/16/2019  . Memory loss 03/16/2019  . Pain in left ankle and joints of left foot 08/06/2018  .  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  . Foot pain, 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  . Dysesthesia 06/04/2016  . 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 History of cervical spinal surgery 06/04/2016    Kerrie Pleasure 11/29/2019, 12:26 PM  [facility] [address] [place], [place], [address] Phone: [phone]   Fax:  [phone]  Name: Derrick White MRN: [id number] Date of Birth: [birth year]

## 2019-11-29 NOTE — Telephone Encounter (Signed)
Pt's wife left a VM to r/s pt's upcoming appt. Please call.

## 2019-12-06 ENCOUNTER — Ambulatory Visit: Payer: Medicare Other

## 2019-12-06 ENCOUNTER — Ambulatory Visit: Payer: Medicare Other | Admitting: Speech Pathology

## 2019-12-06 ENCOUNTER — Other Ambulatory Visit: Payer: Self-pay

## 2019-12-06 ENCOUNTER — Other Ambulatory Visit (HOSPITAL_COMMUNITY): Payer: Self-pay | Admitting: *Deleted

## 2019-12-06 ENCOUNTER — Encounter: Payer: Self-pay | Admitting: Speech Pathology

## 2019-12-06 DIAGNOSIS — G459 Transient cerebral ischemic attack, unspecified: Secondary | ICD-10-CM

## 2019-12-06 DIAGNOSIS — R2681 Unsteadiness on feet: Secondary | ICD-10-CM

## 2019-12-06 DIAGNOSIS — R1312 Dysphagia, oropharyngeal phase: Secondary | ICD-10-CM

## 2019-12-06 DIAGNOSIS — M25572 Pain in left ankle and joints of left foot: Secondary | ICD-10-CM

## 2019-12-06 DIAGNOSIS — M6281 Muscle weakness (generalized): Secondary | ICD-10-CM

## 2019-12-06 DIAGNOSIS — R471 Dysarthria and anarthria: Secondary | ICD-10-CM

## 2019-12-06 DIAGNOSIS — R270 Ataxia, unspecified: Secondary | ICD-10-CM | POA: Diagnosis not present

## 2019-12-06 DIAGNOSIS — R059 Cough, unspecified: Secondary | ICD-10-CM

## 2019-12-06 DIAGNOSIS — R1310 Dysphagia, unspecified: Secondary | ICD-10-CM

## 2019-12-06 NOTE — Therapy (Signed)
[facility] [address] [place], [place], [address] Phone: [phone]   Fax:  [phone]  Physical Therapy Treatment  Patient Details  Name: Derrick White MRN: [id number] Date of Birth: 07-08-1940 Referring Provider (PT): Dr. Domenick Gong   Encounter Date: 12/06/2019   PT End of Session - 12/06/19 0917    Visit Number 2    Number of Visits 13    Date for PT Re-Evaluation 01/10/20    Authorization Type Medicare    Progress Note Due on Visit 10    PT Start Time 0845    PT Stop Time 0930    PT Time Calculation (min) 45 min    Equipment Utilized During Treatment Gait belt    Activity Tolerance Patient tolerated treatment well    Behavior During Therapy [facility] for tasks assessed/performed           Past Medical History:  Diagnosis Date  . Cerebellar ataxia ([place])   . History of hiatal hernia   . History of kidney stones   . Hypertension     Past Surgical History:  Procedure Laterality Date  . APPENDECTOMY     1940s  . [place]  . FRACTURE SURGERY     Left ankle, sx x4  . INGUINAL HERNIA REPAIR Right 03/05/2019   Procedure: OPEN RIGHT INGUINAL HERNIA REPAIR WITH MESH;  Surgeon: Clovis Riley, MD;  Location: [facility];  Service: General;  Laterality: Right;  . KNEE SURGERY Left 1985  . SHOULDER SURGERY Left   . TONSILLECTOMY     1945    There were no vitals filed for this visit.   Subjective Assessment - 12/06/19 0915    Subjective Pt reports ankle is hurting today because of the rain.    Pertinent History HTN, cerebellar ataxia, oropharyngeal dysphagia, Sialorrhea, Chronic R ankle pain    Limitations Walking;Other (comment)   stairs   How long can you sit comfortably? no issues    How long can you stand comfortably? 10 min    How long can you walk comfortably? 200 yards    Diagnostic tests 11/05/19: IMPRESSION:1. 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 Moderate chronic  small vessel ischemic disease.3. Motion degraded head MRA without evidence of a large or mediumvessel occlusion or significant proximal stenosis.4. Negative neck MRA.  IMPRESSION:1. 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 Mild spinal stenosis and mild-to-moderate neural foraminalstenosis at C6-7, stable to minimally progressed.    Patient Stated Goals walk 1 mile comfortably, come down stairs better    Currently in Pain? Yes    Pain Score 6     Pain Location Ankle    Pain Orientation Left    Pain Descriptors / Indicators Aching;Throbbing;Tightness    Pain Type Chronic pain    Pain Onset More than a month ago    Pain Frequency Intermittent                 Manual therapy: Grade III mobilization for calcaneal eversion tilts STM and deep friction mobilization to anterior and lateral ankle on L LE  TherEx: Supine ankle dorsiflexion, eversion, inversion: 3 x 10 L only, green band Standing unilateral gastroc stretch: 3 x 30" R and L Standing bil heel raises: 2 x 10 Walking fwd and bwd with EC: 3 x 15 feet Sit to stand: 2 x 10, pt tends  to favor L LE due to L ankle pain   Access Code: [email or web address] URL: [email or web address] Date: 12/06/2019 Prepared by: Markus Jarvis  Exercises Standing Gastroc Stretch on Foam 1/2 Roll - 2 x daily - 7 x weekly - 3 reps - 30 hold Standing Heel Raise with Support - 1 x daily - 7 x weekly - 2 sets - 10 reps                       PT Short Term Goals - 11/29/19 1215      PT SHORT TERM GOAL #1   Title Patient will be able to perform sit to stand without HHA without pushing knees against chair and with one trial only    Baseline pushes back of knee agianst chair, takes several tries    Time 3    Period Weeks    Status New    Target Date 12/20/19      PT SHORT TERM GOAL #2   Title Patient will be able to  ambulate 25 min with straight cane for 3x/week to improve compliance with walking program    Baseline 200 yards    Time 3    Period Weeks    Status New    Target Date 12/20/19      PT SHORT TERM GOAL #3   Title Patient will be able to come down stairs with reciprocal gait pattern and use of one rail to improve ability to come down stairs without significant ankle pain    Baseline step to pattern, increased L aknle pain with reciprocal gait pattern    Time 3    Period Weeks    Status New    Target Date 12/20/19             PT Long Term Goals - 11/29/19 1218      PT LONG TERM GOAL #1   Title Patient will demo <12 seconds on normal TUG to improve functional mobility and transfers    Baseline 16 seconds (normal); 20 seconds with carrying 15lbs in R hand; 19 sec with carrying 15lbs in L Hand    Time 6    Period Weeks    Status New    Target Date 01/10/20      PT LONG TERM GOAL #2   Title Patient will be able to ambulate for 40 min for 2x/week to improve walking endurance    Baseline 200 yards    Time 6    Period Weeks    Status New    Target Date 12/20/19                 Plan - 12/06/19 0916    Clinical Impression Statement Today's session was focused on improving soft tissue and joint mobility in L ankle, initiate stretching and strengthening program for bil ankles.    Personal Factors and Comorbidities Age;Comorbidity 2;Past/Current Experience;Time since onset of injury/illness/exacerbation    Comorbidities HTN, cerebellar ataxia, oropharyngeal dysphagia, Sialorrhea, Chronic R ankle pain    Examination-Activity Limitations Carry;Lift;Squat;Stairs;Stand;Transfers    Examination-Participation Restrictions Community Activity;Laundry;Shop;Yard Work    Stability/Clinical Decision Making Evolving/Moderate complexity    Rehab Potential Good    PT Treatment/Interventions ADLs/Self Care Home Management;Gait training;Stair training;Functional mobility training;Therapeutic  exercise;Therapeutic activities;Balance training;Neuromuscular re-education;Manual techniques;Patient/family education;Passive range of motion;Visual/perceptual remediation/compensation;Joint Manipulations    PT Next Visit Plan 6 MWT, work on sit to stand without HHA, coming down stairs with rail    PT Home Exercise Plan  Walking program: walk 20 min with cane 3x/week    Consulted and Agree with Plan of Care Patient           Patient will benefit from skilled therapeutic intervention in order to improve the following deficits and impairments:  Abnormal gait, Decreased balance, Decreased activity tolerance, Decreased endurance, Decreased coordination, Decreased mobility, Decreased range of motion, Decreased strength, Difficulty walking, Postural dysfunction, Pain  Visit Diagnosis: Ataxia  Muscle weakness (generalized)  Pain in left ankle and joints of left foot  Unsteadiness on feet  TIA (transient ischemic attack)     Problem List Patient Active Problem List   Diagnosis Date Noted  . TIA (transient ischemic attack) 11/05/2019  . Essential hypertension 11/05/2019  . Cerebellar ataxia ([place]) 11/05/2019  . 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 C6 radiculopathy 05/04/2019  . Pain due to onychomycosis of toenails of both feet 04/28/2019  . Worsened handwriting 03/16/2019  . Gait disturbance 03/16/2019  . Slurred speech 03/16/2019  . Memory loss 03/16/2019  . Pain in left ankle and joints of left foot 08/06/2018  . 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  . Foot pain, 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  . Dysesthesia 06/04/2016  . 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 History of cervical spinal surgery 06/04/2016    Kerrie Pleasure 12/06/2019, 9:27 AM  [facility] [address] [place], [place], [address] Phone: [phone]   Fax:  [phone]  Name: Derrick White MRN: [id number] Date of Birth:  04/24/1941

## 2019-12-06 NOTE — Patient Instructions (Signed)
   Wednesday August 4 at 11:00 (arrive 10:45)  Go to [facility] main entrance [facility]  [place] and tell lobby you need to go to radiology  Questions call [phone]    SLOW LOUD OVER-ENNUNCIATE PAUSE  PA TA [place]  SLOW AND BIG - EXAGGERATE [place], MAKE EACH CONSONANT

## 2019-12-06 NOTE — Therapy (Signed)
[facility] [address] [place], [place], [address] Phone: [phone]   Fax:  [phone]  Speech Language Pathology Evaluation  Patient Details  Name: Derrick White MRN: [id number] Date of Birth: [birth year] Referring Provider (SLP): Dr. Domenick Gong   Encounter Date: 12/06/2019   End of Session - 12/06/19 1050    Visit Number 1    Number of Visits 17    Date for SLP Re-Evaluation 01/31/20    Authorization Type no    SLP Start Time 0932    SLP Stop Time  0272    SLP Time Calculation (min) 42 min    Activity Tolerance Patient tolerated treatment well           Past Medical History:  Diagnosis Date  . Cerebellar ataxia ([place])   . History of hiatal hernia   . History of kidney stones   . Hypertension     Past Surgical History:  Procedure Laterality Date  . APPENDECTOMY     1940s  . [employer]  . FRACTURE SURGERY     Left ankle, sx x4  . INGUINAL HERNIA REPAIR Right 03/05/2019   Procedure: OPEN RIGHT INGUINAL HERNIA REPAIR WITH MESH;  Surgeon: Clovis Riley, MD;  Location: [facility];  Service: General;  Laterality: Right;  . KNEE SURGERY Left 1985  . SHOULDER SURGERY Left   . TONSILLECTOMY     1945    There were no vitals filed for this visit.   Subjective Assessment - 12/06/19 1032    Subjective "My speech is garbled"    Currently in Pain? Yes    Pain Score 6               SLP Evaluation [facility] - 12/06/19 1032      SLP Visit Information   SLP Received On 12/06/19    Referring Provider (SLP) Dr. Domenick Gong    Onset Date 11/05/19    Medical Diagnosis CVA      Subjective   Patient/Family Stated Goal "to talk better"      Pain Assessment   Pain Location Ankle    Pain Orientation Left    Pain Type Chronic pain    Pain Onset More than a month ago    Pain Frequency Intermittent      General Information   HPI  Derrick White is a 79 y.o. 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 Was hospitalized 6/18/-11/06/19. 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 Intermittent transient silent laryngeal penetration, without aspiration, mid thoracic reflux, esophageal dysmotility. Pt with h/o of cerebellar ataxia and is being followed by Dr. Osborne Casco at [facility] independently - on PT caseload      Balance Screen   Has the patient fallen in the past 6 months No    Has the patient had a decrease in activity level because of a fear of falling?  No    Is the patient reluctant to leave their home because of a fear of falling?  No      Prior Functional Status   Cognitive/Linguistic Baseline Within functional limits    Type of Home House     Lives With Spouse    Available Support Family    Vocation Retired      Associate Professor   Overall Cognitive  Status Within Functional Limits for tasks assessed    Attention Alternating      Auditory Comprehension   Overall Auditory Comprehension Appears within functional limits for tasks assessed    Interfering Components Hearing      Verbal Expression   Overall Verbal Expression Appears within functional limits for tasks assessed    Other Verbal Expression Comments denies word finding difficulty, however 1 episode observed may warrant further f/u      Oral Motor/Sensory Function   Overall Oral Motor/Sensory Function Impaired    Labial ROM Within Functional Limits    Labial Symmetry Within Functional Limits    Labial Strength Within Functional Limits    Labial Sensation Within Functional Limits    Labial Coordination WFL    Lingual ROM Within Functional Limits    Lingual Symmetry Within Functional Limits    Lingual Strength Within Functional Limits    Lingual Coordination Reduced    Facial ROM Within Functional Limits    Velum Within Functional Limits    Overall Oral Motor/Sensory  Function drool duirng evaluation      Motor Speech   Overall Motor Speech Impaired    Respiration Within functional limits    Phonation Normal    Resonance Within functional limits    Articulation Impaired    Level of Impairment Sentence    Intelligibility Intelligibility reduced    Word 75-100% accurate    Phrase 75-100% accurate    Sentence 75-100% accurate    Conversation 50-74% accurate    Motor Planning Witnin functional limits    Motor Speech Errors Aware    Effective Techniques Slow rate;Increased vocal intensity;Over-articulate;Pause                           SLP Education - 12/06/19 1050    Education Details goals for ST, compensations for dysarthria; MBSS procedure    Person(s) Educated Patient    Methods Explanation;Demonstration;Verbal cues;Handout    Comprehension Verbalized understanding;Verbal cues required;Need further instruction            SLP Short Term Goals - 12/06/19 1201      SLP SHORT TERM GOAL #1   Title Pt will complete HEP for dysarthria and dysphagia with rare min A over 2 sessions    Time 4    Period Weeks    Status New      SLP SHORT TERM GOAL #2   Title Pt will follow swallow precautions (pending MBSS results) with rare min A over 2 sessions    Time 4    Period Weeks    Status New      SLP SHORT TERM GOAL #3   Title Pt will say 18/20 personally relevant phrases/sentences with compensatory strategies for dysarthira with 100% intelligibilty with rare min A    Time 4    Period Weeks    Status New      SLP SHORT TERM GOAL #4   Title Pt will use compensatory strategies over phone conversation with friends/family and report 2 or less requests for repetition over 2 sessions    Time 4    Period Weeks    Status New            SLP Long Term Goals - 12/06/19 1205      SLP LONG TERM GOAL #1   Title Pt will be 95% intelligible over 25 minute complex conversation with rare min A over 2 sessions    Time  8    Period  Weeks    Status New      SLP LONG TERM GOAL #2   Title Pt will report 2 or less requests for repeition on 3 business calls/calls to strangers over 2 sessions.    Time 8    Period Weeks    Status New      SLP LONG TERM GOAL #3   Title Pt will follow diet recommendations and swallow precautions with mod I over 2 sessions    Time 8    Period Weeks    Status New      SLP LONG TERM GOAL #4   Title Pt will improve score on Communicative Effectiveness Survey by 2 points (23 is original score)    Time 8    Period Weeks    Status New            Plan - 12/06/19 1051    Clinical Impression Statement Mr. Jesua Tamblyn is referred for oropharyngeal dysphagia and dysarthria. He had barium swallow (results in HPI) but not MBSS. 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 Calel reports coughint and choking with meals and denies globus sensation of food getting stuck in his throat. He was observed to drool throughout evaluation. He was unaware of this and I pointed it out. He affirms he drools throughout the day.  Clinical swallow evaluation (regular solid, cup thin liquid)  revealed immediate cought and immediate throat clearing, change in vocal quality, diffuse lingual residue and residue left buccal pocket which he sense and cleared with lingual sweep. Of note, Nixon consistently took liquid sip with unswallowed solid to "help get it down." He affirms he does this at home as well. When asked how he takes his meds, he replied "carefully." no h/o pna. Gianny reports slurred speech for approx 18 months, however he states his speech is worse since CVA. His wife asks him to repeat himself frequently at home. Communicative Effectiveness Survery revealed most difficulty conversing with stranger and talking on the phone. Rapid alternating speech tasks WNL. Speech charcterized by imprecise consonants, particularly alveolar stops, fricatives,  affricates. In this quiet environement, Roma is judged to be 85% intelligilble and repeats required 5x. In light of dysarthria, drool, overt s/s of dysphagia and results of barium swallow  I recommend MBSS with SLP. We scheduled this today for 12/22/19. Goals may be modified pending MBSS. I recommend skilled ST to maximize intelligibility and safety of swallow.    Speech Therapy Frequency 2x / week    Duration --   8 weeks or 17 visits   Treatment/Interventions Aspiration precaution training;Language facilitation;Environmental controls;Cueing hierarchy;Oral motor exercises;SLP instruction and feedback;Functional tasks;Compensatory strategies;Cognitive reorganization;Compensatory techniques;Pharyngeal strengthening exercises;Diet toleration management by SLP;Trials of upgraded texture/liquids;Internal/external aids;Multimodal communcation approach;Patient/family education    Potential to Achieve Goals Good    Consulted and Agree with Plan of Care Patient           Patient will benefit from skilled therapeutic intervention in order to improve the following deficits and impairments:   Dysphagia, oropharyngeal phase - Plan: SLP modified barium swallow  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 TIA (transient ischemic attack) 11/05/2019  . Essential hypertension 11/05/2019  . Cerebellar ataxia ([place]) 11/05/2019  . 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 C6 radiculopathy 05/04/2019  . Pain due to onychomycosis  of toenails of both feet 04/28/2019  . Worsened handwriting 03/16/2019  . Gait disturbance 03/16/2019  . Slurred speech 03/16/2019  . Memory loss 03/16/2019  . Pain in left ankle and joints of left foot 08/06/2018  . 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  . Foot pain, 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  . Dysesthesia 06/04/2016  . 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 History of cervical spinal  surgery 06/04/2016    Aydden Cumpian, Annye Rusk MS, CCC-SLP 12/06/2019, 12:10 PM  [facility] [address] [place], [place], [address] Phone: [phone]   Fax:  [phone]  Name: RAYON MCCHRISTIAN MRN: [id number] Date of Birth: November 26, 1940

## 2019-12-10 ENCOUNTER — Ambulatory Visit: Payer: Medicare Other

## 2019-12-10 ENCOUNTER — Other Ambulatory Visit: Payer: Self-pay

## 2019-12-10 DIAGNOSIS — G459 Transient cerebral ischemic attack, unspecified: Secondary | ICD-10-CM

## 2019-12-10 DIAGNOSIS — R2681 Unsteadiness on feet: Secondary | ICD-10-CM | POA: Diagnosis not present

## 2019-12-10 DIAGNOSIS — M25572 Pain in left ankle and joints of left foot: Secondary | ICD-10-CM | POA: Diagnosis not present

## 2019-12-10 DIAGNOSIS — M6281 Muscle weakness (generalized): Secondary | ICD-10-CM | POA: Diagnosis not present

## 2019-12-10 DIAGNOSIS — R270 Ataxia, unspecified: Secondary | ICD-10-CM

## 2019-12-10 DIAGNOSIS — R1312 Dysphagia, oropharyngeal phase: Secondary | ICD-10-CM | POA: Diagnosis not present

## 2019-12-10 NOTE — Therapy (Signed)
[facility] [address] [place], [place], [address] Phone: [phone]   Fax:  [phone]  Physical Therapy Treatment  Patient Details  Name: Derrick White MRN: [id number] Date of Birth: November 07, 1940 Referring Provider (PT): Dr. Domenick Gong   Encounter Date: 12/10/2019   PT End of Session - 12/10/19 0932    Visit Number 3    Number of Visits 13    Date for PT Re-Evaluation 01/10/20    Authorization Type Medicare    Progress Note Due on Visit 10    PT Start Time 0845    PT Stop Time 0930    PT Time Calculation (min) 45 min    Equipment Utilized During Treatment Gait belt    Activity Tolerance Patient tolerated treatment well    Behavior During Therapy [facility] for tasks assessed/performed           Past Medical History:  Diagnosis Date  . Cerebellar ataxia ([place])   . History of hiatal hernia   . History of kidney stones   . Hypertension     Past Surgical History:  Procedure Laterality Date  . APPENDECTOMY     1940s  . [place]  . FRACTURE SURGERY     Left ankle, sx x4  . INGUINAL HERNIA REPAIR Right 03/05/2019   Procedure: OPEN RIGHT INGUINAL HERNIA REPAIR WITH MESH;  Surgeon: Clovis Riley, MD;  Location: [facility];  Service: General;  Laterality: Right;  . KNEE SURGERY Left 1985  . SHOULDER SURGERY Left   . TONSILLECTOMY     1945    There were no vitals filed for this visit.   Subjective Assessment - 12/10/19 0850    Subjective Pt reports he feels about 50% better in his ankle pain    Pertinent History HTN, cerebellar ataxia, oropharyngeal dysphagia, Sialorrhea, Chronic R ankle pain    Limitations Walking;Other (comment)   stairs   How long can you sit comfortably? no issues    How long can you stand comfortably? 10 min    How long can you walk comfortably? 200 yards    Diagnostic tests 11/05/19: IMPRESSION:1. 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 Moderate chronic  small vessel ischemic disease.3. Motion degraded head MRA without evidence of a large or mediumvessel occlusion or significant proximal stenosis.4. Negative neck MRA.  IMPRESSION:1. 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 Mild spinal stenosis and mild-to-moderate neural foraminalstenosis at C6-7, stable to minimally progressed.    Patient Stated Goals walk 1 mile comfortably, come down stairs better    Pain Score 2     Pain Location Ankle    Pain Orientation Left    Pain Descriptors / Indicators Aching;Tightness    Pain Onset More than a month ago    Pain Frequency Intermittent                 Manual therapy: Grade III mobilization for calcaneal eversion tilts STM and deep friction mobilization to anterior and lateral ankle on L LE STM to dorsum of foot near base of toes  TherEx: Supine ankle dorsiflexion, eversion, inversion: 3 x 10 L only, green band Standing unilateral gastroc stretch: 3 x 30" R and L Seated toe flexion stretches: 5 x 15" Seated toe seperation stretches: 3 x 15"    Access Code: [email or web address] URL: [email or web address] Date: 12/06/2019 Prepared by: Markus Jarvis  Exercises Standing Gastroc Stretch on Foam 1/2 Roll - 2 x daily - 7 x weekly - 3 reps - 30 hold Standing Heel Raise with Support - 1 x daily - 7 x weekly - 2 sets - 10 reps                          PT Education - 12/10/19 0931    Education Details Pt needed reeducation to perform standing unilateral calf stretch correctly.    Person(s) Educated Patient    Methods Explanation    Comprehension Verbalized understanding            PT Short Term Goals - 11/29/19 1215      PT SHORT TERM GOAL #1   Title Patient will be able to perform sit to stand without HHA without pushing knees against chair and with one trial only    Baseline pushes back of knee  agianst chair, takes several tries    Time 3    Period Weeks    Status New    Target Date 12/20/19      PT SHORT TERM GOAL #2   Title Patient will be able to ambulate 25 min with straight cane for 3x/week to improve compliance with walking program    Baseline 200 yards    Time 3    Period Weeks    Status New    Target Date 12/20/19      PT SHORT TERM GOAL #3   Title Patient will be able to come down stairs with reciprocal gait pattern and use of one rail to improve ability to come down stairs without significant ankle pain    Baseline step to pattern, increased L aknle pain with reciprocal gait pattern    Time 3    Period Weeks    Status New    Target Date 12/20/19             PT Long Term Goals - 11/29/19 1218      PT LONG TERM GOAL #1   Title Patient will demo <12 seconds on normal TUG to improve functional mobility and transfers    Baseline 16 seconds (normal); 20 seconds with carrying 15lbs in R hand; 19 sec with carrying 15lbs in L Hand    Time 6    Period Weeks    Status New    Target Date 01/10/20      PT LONG TERM GOAL #2   Title Patient will be able to ambulate for 40 min for 2x/week to improve walking endurance    Baseline 200 yards    Time 6    Period Weeks    Status New    Target Date 12/20/19                 Plan - 12/10/19 0929    Clinical Impression Statement Today's session was focused on continued management fo soft tissue restrictions in anlke with manual therapy, stretching, gradual strengthening and pain management. Patient is subjectively reporting improivng pain levels in ankle which is helping his walking.    Personal Factors and Comorbidities Age;Comorbidity 2;Past/Current Experience;Time since onset of injury/illness/exacerbation    Comorbidities HTN, cerebellar ataxia, oropharyngeal dysphagia, Sialorrhea, Chronic R ankle pain    Examination-Activity Limitations Carry;Lift;Squat;Stairs;Stand;Transfers    Examination-Participation  Restrictions Community Activity;Laundry;Shop;Yard Work    [employer] officer Evolving/Moderate complexity    Rehab Potential Good    PT Treatment/Interventions ADLs/Self Care Home Management;Gait training;Stair  training;Functional mobility training;Therapeutic exercise;Therapeutic activities;Balance training;Neuromuscular re-education;Manual techniques;Patient/family education;Passive range of motion;Visual/perceptual remediation/compensation;Joint Manipulations    PT Next Visit Plan 6 MWT, work on sit to stand without HHA, coming down stairs with rail    PT Home Exercise Plan Walking program: walk 20 min with cane 3x/week;Access Code: [email or web address]    Consulted and Agree with Plan of Care Patient           Patient will benefit from skilled therapeutic intervention in order to improve the following deficits and impairments:  Abnormal gait, Decreased balance, Decreased activity tolerance, Decreased endurance, Decreased coordination, Decreased mobility, Decreased range of motion, Decreased strength, Difficulty walking, Postural dysfunction, Pain  Visit Diagnosis: Ataxia  Muscle weakness (generalized)  Pain in left ankle and joints of left foot  Unsteadiness on feet  TIA (transient ischemic attack)     Problem List Patient Active Problem List   Diagnosis Date Noted  . TIA (transient ischemic attack) 11/05/2019  . Essential hypertension 11/05/2019  . Cerebellar ataxia ([place]) 11/05/2019  . 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 C6 radiculopathy 05/04/2019  . Pain due to onychomycosis of toenails of both feet 04/28/2019  . Worsened handwriting 03/16/2019  . Gait disturbance 03/16/2019  . Slurred speech 03/16/2019  . Memory loss 03/16/2019  . Pain in left ankle and joints of left foot 08/06/2018  . 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  . Foot pain, 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  . Dysesthesia 06/04/2016  . 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 History of  cervical spinal surgery 06/04/2016    Kerrie Pleasure, PT 12/10/2019, 9:32 AM  [facility] [address] [place], [place], [address] Phone: [phone]   Fax:  [phone]  Name: Derrick White MRN: [id number] Date of Birth: 1941-04-29

## 2019-12-15 ENCOUNTER — Ambulatory Visit: Payer: Medicare Other | Admitting: Neurology

## 2019-12-21 ENCOUNTER — Other Ambulatory Visit: Payer: Self-pay

## 2019-12-21 ENCOUNTER — Ambulatory Visit: Payer: Self-pay | Admitting: Neurology

## 2019-12-21 ENCOUNTER — Ambulatory Visit: Payer: Medicare Other | Attending: Internal Medicine

## 2019-12-21 DIAGNOSIS — G459 Transient cerebral ischemic attack, unspecified: Secondary | ICD-10-CM

## 2019-12-21 DIAGNOSIS — R471 Dysarthria and anarthria: Secondary | ICD-10-CM | POA: Insufficient documentation

## 2019-12-21 DIAGNOSIS — R1310 Dysphagia, unspecified: Secondary | ICD-10-CM | POA: Insufficient documentation

## 2019-12-21 DIAGNOSIS — R1312 Dysphagia, oropharyngeal phase: Secondary | ICD-10-CM | POA: Diagnosis not present

## 2019-12-21 DIAGNOSIS — M25572 Pain in left ankle and joints of left foot: Secondary | ICD-10-CM | POA: Diagnosis not present

## 2019-12-21 DIAGNOSIS — R2681 Unsteadiness on feet: Secondary | ICD-10-CM | POA: Diagnosis not present

## 2019-12-21 DIAGNOSIS — R270 Ataxia, unspecified: Secondary | ICD-10-CM | POA: Insufficient documentation

## 2019-12-21 DIAGNOSIS — M6281 Muscle weakness (generalized): Secondary | ICD-10-CM | POA: Diagnosis not present

## 2019-12-21 NOTE — Therapy (Signed)
[name] [address] [place], [place], [address] Phone: [phone]   Fax:  [phone]  Physical Therapy Treatment  Patient Details  Name: Derrick White MRN: [id number] Date of Birth: March 26, 1941 Referring Provider (PT): Dr. Domenick Gong   Encounter Date: 12/21/2019   PT End of Session - 12/21/19 1305    Visit Number 4    Number of Visits 13    Date for PT Re-Evaluation 01/10/20    Authorization Type Medicare    Progress Note Due on Visit 10    PT Start Time 0845    PT Stop Time 0930    PT Time Calculation (min) 45 min    Equipment Utilized During Treatment Gait belt    Activity Tolerance Patient tolerated treatment well    Behavior During Therapy [facility] for tasks assessed/performed           Past Medical History:  Diagnosis Date  . Cerebellar ataxia ([place])   . History of hiatal hernia   . History of kidney stones   . Hypertension     Past Surgical History:  Procedure Laterality Date  . APPENDECTOMY     1940s  . [place]  . FRACTURE SURGERY     Left ankle, sx x4  . INGUINAL HERNIA REPAIR Right 03/05/2019   Procedure: OPEN RIGHT INGUINAL HERNIA REPAIR WITH MESH;  Surgeon: Clovis Riley, MD;  Location: [place];  Service: General;  Laterality: Right;  . KNEE SURGERY Left 1985  . SHOULDER SURGERY Left   . TONSILLECTOMY     1945    There were no vitals filed for this visit.   Subjective Assessment - 12/21/19 1303    Subjective Pt reports his ankle is feeling much better. He is walking up to 3/4 of mile.    Pertinent History HTN, cerebellar ataxia, oropharyngeal dysphagia, Sialorrhea, Chronic R ankle pain    Limitations Walking;Other (comment)   stairs   How long can you sit comfortably? no issues    How long can you stand comfortably? 10 min    How long can you walk comfortably? 200 yards    Diagnostic tests 11/05/19: IMPRESSION:1. 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 Moderate chronic small vessel ischemic disease.3. Motion degraded head MRA without evidence of a large or mediumvessel occlusion or significant proximal stenosis.4. Negative neck MRA.  IMPRESSION:1. 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 Mild spinal stenosis and mild-to-moderate neural foraminalstenosis at C6-7, stable to minimally progressed.    Patient Stated Goals walk 1 mile comfortably, come down stairs better    Currently in Pain? Yes    Pain Score 5     Pain Location Ankle    Pain Orientation Left    Pain Onset More than a month ago              Manual therapy Myofascial release to anterior tib border, Lateral malleolus, interosseous space Grade IV subtalar eversion mobilization  TherEx: Toe curls: 20x Supine hooklying ankle DF and inversion, eversion: AROM only 10x Supine hooklying manually resisted DF/eve to PF/inversion and DF/inv to PF/eve with slow reversal: 20x each  Gait training: ambulated 600 feet with 2 walking sticks and 400 feet with 1 walking stick in R hand, cues to keep walking sticks apart to prevent kicking them. Patient demonstrated adequate gait with one walking stick. Patient did not  report increase in L ankle pain after walking 1000' with walking sticks.                         PT Short Term Goals - 11/29/19 1215      PT SHORT TERM GOAL #1   Title Patient will be able to perform sit to stand without HHA without pushing knees against chair and with one trial only    Baseline pushes back of knee agianst chair, takes several tries    Time 3    Period Weeks    Status New    Target Date 12/20/19      PT SHORT TERM GOAL #2   Title Patient will be able to ambulate 25 min with straight cane for 3x/week to improve compliance with walking program    Baseline 200 yards    Time 3    Period Weeks    Status New     Target Date 12/20/19      PT SHORT TERM GOAL #3   Title Patient will be able to come down stairs with reciprocal gait pattern and use of one rail to improve ability to come down stairs without significant ankle pain    Baseline step to pattern, increased L aknle pain with reciprocal gait pattern    Time 3    Period Weeks    Status New    Target Date 12/20/19             PT Long Term Goals - 11/29/19 1218      PT LONG TERM GOAL #1   Title Patient will demo <12 seconds on normal TUG to improve functional mobility and transfers    Baseline 16 seconds (normal); 20 seconds with carrying 15lbs in R hand; 19 sec with carrying 15lbs in L Hand    Time 6    Period Weeks    Status New    Target Date 01/10/20      PT LONG TERM GOAL #2   Title Patient will be able to ambulate for 40 min for 2x/week to improve walking endurance    Baseline 200 yards    Time 6    Period Weeks    Status New    Target Date 12/20/19                 Plan - 12/21/19 1304    Clinical Impression Statement Today's skilled session was focused on improivng L ankle flexibility, mobility and strength to improve pain with standing and walking and to educate pt on how to properly use walking sticks with ambulation.    Personal Factors and Comorbidities Age;Comorbidity 2;Past/Current Experience;Time since onset of injury/illness/exacerbation    Comorbidities HTN, cerebellar ataxia, oropharyngeal dysphagia, Sialorrhea, Chronic R ankle pain    Examination-Activity Limitations Carry;Lift;Squat;Stairs;Stand;Transfers    Examination-Participation Restrictions Community Activity;Laundry;Shop;Yard Work    Stability/Clinical Decision Making Evolving/Moderate complexity    Rehab Potential Good    PT Treatment/Interventions ADLs/Self Care Home Management;Gait training;Stair training;Functional mobility training;Therapeutic exercise;Therapeutic activities;Balance training;Neuromuscular re-education;Manual  techniques;Patient/family education;Passive range of motion;Visual/perceptual remediation/compensation;Joint Manipulations    PT Next Visit Plan 6 MWT, work on sit to stand without HHA, coming down stairs with rail    PT Home Exercise Plan Walking program: walk 20 min with cane 3x/week;Access Code: [email or web address]    Consulted and Agree with Plan of Care Patient           Patient will benefit from skilled therapeutic intervention in  order to improve the following deficits and impairments:  Abnormal gait, Decreased balance, Decreased activity tolerance, Decreased endurance, Decreased coordination, Decreased mobility, Decreased range of motion, Decreased strength, Difficulty walking, Postural dysfunction, Pain  Visit Diagnosis: Ataxia  Muscle weakness (generalized)  Pain in left ankle and joints of left foot  Unsteadiness on feet  TIA (transient ischemic attack)     Problem List Patient Active Problem List   Diagnosis Date Noted  . TIA (transient ischemic attack) 11/05/2019  . Essential hypertension 11/05/2019  . Cerebellar ataxia ([place]) 11/05/2019  . 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 C6 radiculopathy 05/04/2019  . Pain due to onychomycosis of toenails of both feet 04/28/2019  . Worsened handwriting 03/16/2019  . Gait disturbance 03/16/2019  . Slurred speech 03/16/2019  . Memory loss 03/16/2019  . Pain in left ankle and joints of left foot 08/06/2018  . 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  . Foot pain, 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  . Dysesthesia 06/04/2016  . 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 History of cervical spinal surgery 06/04/2016    Kerrie Pleasure 12/21/2019, 1:08 PM  [facility] [place], [place], [address] Phone: [phone]   Fax:  [phone]  Name: Derrick White MRN: [id number] Date of Birth: 10-Nov-1940

## 2019-12-22 ENCOUNTER — Ambulatory Visit (HOSPITAL_COMMUNITY)
Admission: RE | Admit: 2019-12-22 | Discharge: 2019-12-22 | Disposition: A | Discharge status: Home / Self Care | Payer: Medicare Other | Source: Ambulatory Visit | Attending: Internal Medicine | Admitting: Internal Medicine

## 2019-12-22 DIAGNOSIS — R05 Cough: Secondary | ICD-10-CM | POA: Insufficient documentation

## 2019-12-22 DIAGNOSIS — R1310 Dysphagia, unspecified: Secondary | ICD-10-CM | POA: Diagnosis not present

## 2019-12-22 DIAGNOSIS — R1312 Dysphagia, oropharyngeal phase: Secondary | ICD-10-CM | POA: Diagnosis not present

## 2019-12-22 DIAGNOSIS — R471 Dysarthria and anarthria: Secondary | ICD-10-CM | POA: Diagnosis not present

## 2019-12-22 DIAGNOSIS — R059 Cough, unspecified: Secondary | ICD-10-CM

## 2019-12-22 NOTE — Progress Notes (Signed)
Modified Barium Swallow Progress Note  Patient Details  Name: Derrick White MRN: [id number] Date of Birth: 1941-02-28  Today's Date: 12/22/2019  Modified Barium Swallow completed.  Full report located under Chart Review in the Imaging Section.  Brief recommendations include the following:  Clinical Impression  Overall, pt's swallow function was within normal limits. Trace and flash laryngeal penetration observed with thin liquids considered normal especially given his neurological impairments. Aspiration risk would be greater if pt consumed large amounts sequentially. There was mild pyriform sinus residue following cracker that cleared as study progressed and barium pill lodged in his vallecular space but cleared with a spontaneous second swallow. Pills hesitated mid esophagus and puree assisted transit to stomach (known esophageal dysmotility). His vocal quality noted to be wet at end of MBS and cleared with cues to throat clear. Educated verbally and via written information esophageal strategies and cued to take small sips, avoid vocalizing immediately after swallows. Recommend he continue regular texture, thin liquids, pills with thin or in puree if large.        Swallow Evaluation Recommendations       SLP Diet Recommendations: Regular solids;Thin liquid   Liquid Administration via: Cup;Straw   Medication Administration: Other (Comment) (if large place in puree)           Postural Changes: Remain semi-upright after after feeds/meals (Comment);Seated upright at 90 degrees   Oral Care Recommendations: Oral care BID        [name] 12/22/2019,1:57 PM  Orbie Pyo [place].Ed Risk analyst [phone] Office [phone]

## 2019-12-22 NOTE — Progress Notes (Signed)
Modified Barium Swallow Progress Note  Patient Details  Name: Derrick White MRN: [id number] Date of Birth: 1940/08/02  Today's Date: 12/22/2019  Modified Barium Swallow completed.  Full report located under Chart Review in the Imaging Section.  Brief recommendations include the following:  Clinical Impression  Overall, pt's swallow function was within normal limits. Trace and flash laryngeal penetration observed with thin liquids considered normal especially given his neurological impairments. Aspiration risk would be greater if pt consumed large amounts sequentially. There was mild pyriform sinus residue following cracker that cleared as study progressed and barium pill lodged in his vallecular space but cleared with a spontaneous second swallow. Pills hesitated mid esophagus and puree assisted transit to stomach (known esophageal dysmotility). His vocal quality noted to be wet at end of MBS and cleared with cues to throat clear. Educated verbally and via written information esophageal strategies and cued to take small sips, avoid vocalizing immediately after swallows. Recommend he continue regular texture, thin liquids, pills with thin or in puree if large.        Swallow Evaluation Recommendations       SLP Diet Recommendations: Regular solids;Thin liquid   Liquid Administration via: Cup;Straw   Medication Administration: Other (Comment) (if large place in puree)           Postural Changes: Remain semi-upright after after feeds/meals (Comment);Seated upright at 90 degrees   Oral Care Recommendations: Oral care BID        [name] 12/22/2019,1:57 PM  [name].Ed Risk analyst [phone] Office [phone]

## 2019-12-24 ENCOUNTER — Ambulatory Visit: Payer: Medicare Other

## 2019-12-24 ENCOUNTER — Other Ambulatory Visit: Payer: Self-pay

## 2019-12-24 DIAGNOSIS — M6281 Muscle weakness (generalized): Secondary | ICD-10-CM | POA: Diagnosis not present

## 2019-12-24 DIAGNOSIS — M25572 Pain in left ankle and joints of left foot: Secondary | ICD-10-CM | POA: Diagnosis not present

## 2019-12-24 DIAGNOSIS — R471 Dysarthria and anarthria: Secondary | ICD-10-CM

## 2019-12-24 DIAGNOSIS — R2681 Unsteadiness on feet: Secondary | ICD-10-CM | POA: Diagnosis not present

## 2019-12-24 DIAGNOSIS — G459 Transient cerebral ischemic attack, unspecified: Secondary | ICD-10-CM

## 2019-12-24 DIAGNOSIS — R270 Ataxia, unspecified: Secondary | ICD-10-CM | POA: Diagnosis not present

## 2019-12-24 DIAGNOSIS — R1310 Dysphagia, unspecified: Secondary | ICD-10-CM

## 2019-12-24 DIAGNOSIS — R1312 Dysphagia, oropharyngeal phase: Secondary | ICD-10-CM | POA: Diagnosis not present

## 2019-12-24 NOTE — Patient Instructions (Addendum)
   When you eat and drink: - prior to talking, make sure you swallow again to clear food/liquid out of your mouth and throat.  - If you hear your voice is "gurgly" clear your throat and swallow hard.  ============================================================================================  Speech Exercises - do twice a day  Repeat each word or phrase twice SLOW AND BIG - EXAGGERATE YOUR MOUTH, MAKE EVERY CONSONANT   PATA TAKA KAPA PATAKA  [place]  Call the cat "Buttercup" A calendar of [place], [place] Four floors to cover Yellow oil ointment Fellow lovers of felines Catastrophe in Ralls' plums The church's chimes chimed Telling time 'til eleven Five valve levers Keep the gate closed Go see that guy Fat cows give milk [employer] Gophers Fat frogs flip freely Kohl's into bed Get that game to [employer] Thick thistles stick together Cinnamon aluminum linoleum Black bugs blood Lovely lemon linament Red leather, yellow leather  Big grocery buggy    Purple baby carriage Proper copper coffee pot Ripe purple cabbage Three free throws [employer] tackled  [employer] dipped the dessert  [facility] [place] that [employer] of [place] Shirts shrink, shells shouldn't [place] [employer] Take the tackle box File the flash message Give me five flapjacks Fundamental relatives Dye the pets purple Talking [place] time after time Dark chocolate chunks Political landscape of the kingdom Estate manager/land agent genius We played yo-yos yesterday

## 2019-12-24 NOTE — Therapy (Signed)
[facility] [address] [place], [place], [address] Phone: [phone]   Fax:  [phone]  Physical Therapy Treatment  Patient Details  Name: Derrick White MRN: [id number] Date of Birth: Sep 28, 1940 Referring Provider (PT): Dr. Domenick Gong   Encounter Date: 12/24/2019   PT End of Session - 12/24/19 1053    Visit Number 5    Number of Visits 13    Date for PT Re-Evaluation 01/10/20    Authorization Type Medicare    Progress Note Due on Visit 10    PT Start Time 0930    PT Stop Time 1015    PT Time Calculation (min) 45 min    Equipment Utilized During Treatment Gait belt    Activity Tolerance Patient tolerated treatment well    Behavior During Therapy [facility] for tasks assessed/performed           Past Medical History:  Diagnosis Date  . Cerebellar ataxia ([place])   . History of hiatal hernia   . History of kidney stones   . Hypertension     Past Surgical History:  Procedure Laterality Date  . APPENDECTOMY     1940s  . [place]  . FRACTURE SURGERY     Left ankle, sx x4  . INGUINAL HERNIA REPAIR Right 03/05/2019   Procedure: OPEN RIGHT INGUINAL HERNIA REPAIR WITH MESH;  Surgeon: Clovis Riley, MD;  Location: [place];  Service: General;  Laterality: Right;  . KNEE SURGERY Left 1985  . SHOULDER SURGERY Left   . TONSILLECTOMY     1945    There were no vitals filed for this visit.       [facility] PT Assessment - 12/24/19 1004      Standardized Balance Assessment   Standardized Balance Assessment Five Times Sit to Stand    Five times sit to stand comments  13.50    10 Meter Walk 11.40      Berg Balance Test   Sit to Stand Able to stand without using hands and stabilize independently    Standing Unsupported Able to stand safely 2 minutes    Sitting with Back Unsupported but Feet Supported on Floor or Stool Able to sit safely and securely 2 minutes    Stand to Sit Sits safely with  minimal use of hands    Transfers Able to transfer safely, minor use of hands    Standing Unsupported with Eyes Closed Able to stand 10 seconds safely    Standing Unsupported with Feet Together Able to place feet together independently and stand 1 minute safely    From Standing, Reach Forward with Outstretched Arm Can reach confidently >25 cm (10")    From Standing Position, Pick up Object from Floor Able to pick up shoe safely and easily    From Standing Position, Turn to Look Behind Over each Shoulder Looks behind from both sides and weight shifts well    Turn 360 Degrees Able to turn 360 degrees safely in 4 seconds or less    Standing Unsupported, Alternately Place Feet on Step/Stool Able to stand independently and safely and complete 8 steps in 20 seconds    Standing Unsupported, One Foot in Front Able to place foot tandem independently and hold 30 seconds    Standing on One Leg Able to lift leg independently and hold > 10 seconds    Total Score 56  Manual therapy Myofascial release to anterior tib border, Lateral malleolus, interosseous space Grade IV subtalar eversion mobilization  TherEx: Toe curls seated on towel: 20 x 5" holds Supine hooklying ankle DF and inversion, eversion: AROM only 20x Supine hooklying manually resisted DF/eve to PF/inversion and DF/inv to PF/eve with slow reversal: 20x each SLS: 5 x 15" R and L, able to stand on R leg for 18 sec most, 20 sec most on L LE 5x sit to stand Turning 360 deg: 3x R and L in square, 3x without squre but cues to pick up his feet and turning least amout of steps that he can                  PT Short Term Goals - 12/24/19 1051      PT SHORT TERM GOAL #1   Title Patient will be able to perform sit to stand without HHA without pushing knees against chair and with one trial only    Baseline pushes back of knee agianst chair, takes several tries; able to perform sit to stand without arm  support (12/24/19)    Time 3    Period Weeks    Status Achieved    Target Date 12/20/19      PT SHORT TERM GOAL #2   Title Patient will be able to ambulate 25 min with straight cane for 3x/week to improve compliance with walking program    Baseline 200 yards, able to walk 3/4 to 1 mile with one walking stick (12/24/19)    Time 3    Period Weeks    Status Achieved    Target Date 12/20/19      PT SHORT TERM GOAL #3   Title Patient will be able to come down stairs with reciprocal gait pattern and use of one rail to improve ability to come down stairs without significant ankle pain    Baseline step to pattern, increased L aknle pain with reciprocal gait pattern    Time 3    Period Weeks    Status New    Target Date 12/20/19             PT Long Term Goals - 11/29/19 1218      PT LONG TERM GOAL #1   Title Patient will demo <12 seconds on normal TUG to improve functional mobility and transfers    Baseline 16 seconds (normal); 20 seconds with carrying 15lbs in R hand; 19 sec with carrying 15lbs in L Hand    Time 6    Period Weeks    Status New    Target Date 01/10/20      PT LONG TERM GOAL #2   Title Patient will be able to ambulate for 40 min for 2x/week to improve walking endurance    Baseline 200 yards    Time 6    Period Weeks    Status New    Target Date 12/20/19                 Plan - 12/24/19 1052    Clinical Impression Statement Patient has made a significant progress in his BBS and gait speed compared to initial eval. patient is able to perform single leg stance for 15-20 seconds on each leg. Patient is reporting improving pain. patient has made significant progress in his short term functional gaols.    Personal Factors and Comorbidities Age;Comorbidity 2;Past/Current Experience;Time since onset of injury/illness/exacerbation    Comorbidities HTN, cerebellar ataxia, oropharyngeal dysphagia,  Sialorrhea, Chronic R ankle pain    Examination-Activity Limitations  Carry;Lift;Squat;Stairs;Stand;Transfers    Examination-Participation Restrictions Community Activity;Laundry;Shop;Yard Work    Stability/Clinical Decision Making Evolving/Moderate complexity    Rehab Potential Good    PT Treatment/Interventions ADLs/Self Care Home Management;Gait training;Stair training;Functional mobility training;Therapeutic exercise;Therapeutic activities;Balance training;Neuromuscular re-education;Manual techniques;Patient/family education;Passive range of motion;Visual/perceptual remediation/compensation;Joint Manipulations    PT Next Visit Plan 6 MWT, work on sit to stand without HHA, coming down stairs with rail    PT Home Exercise Plan Walking program: walk 20 min with cane 3x/week;Access Code: [email or web address]    Consulted and Agree with Plan of Care Patient           Patient will benefit from skilled therapeutic intervention in order to improve the following deficits and impairments:  Abnormal gait, Decreased balance, Decreased activity tolerance, Decreased endurance, Decreased coordination, Decreased mobility, Decreased range of motion, Decreased strength, Difficulty walking, Postural dysfunction, Pain  Visit Diagnosis: Ataxia  Muscle weakness (generalized)  Pain in left ankle and joints of left foot  Unsteadiness on feet  TIA (transient ischemic attack)     Problem List Patient Active Problem List   Diagnosis Date Noted  . TIA (transient ischemic attack) 11/05/2019  . Essential hypertension 11/05/2019  . Cerebellar ataxia ([place]) 11/05/2019  . 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 C6 radiculopathy 05/04/2019  . Pain due to onychomycosis of toenails of both feet 04/28/2019  . Worsened handwriting 03/16/2019  . Gait disturbance 03/16/2019  . Slurred speech 03/16/2019  . Memory loss 03/16/2019  . Pain in left ankle and joints of left foot 08/06/2018  . 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  . Foot pain, 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  . Dysesthesia 06/04/2016  .  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 History of cervical spinal surgery 06/04/2016    Kerrie Pleasure, PT 12/24/2019, 10:56 AM  [facility] [address] [place], [place], [address] Phone: [phone]   Fax:  [phone]  Name: ZACARI STIFF MRN: [id number] Date of Birth: 1941-03-12

## 2019-12-25 NOTE — Therapy (Signed)
[facility] [address] [place], [place], [address] Phone: [phone]   Fax:  [phone]  Speech Language Pathology Treatment  Patient Details  Name: Derrick White MRN: [id number] Date of Birth: 02-06-1941 Referring Provider (SLP): Dr. Domenick Gong   Encounter Date: 12/24/2019   End of Session - 12/25/19 0034    Visit Number 2    Number of Visits 17    Date for SLP Re-Evaluation 01/31/20    Authorization Type no    SLP Start Time 0851    SLP Stop Time  0931    SLP Time Calculation (min) 40 min    Activity Tolerance Patient tolerated treatment well           Past Medical History:  Diagnosis Date  . C6 radiculopathy 05/04/2019  . Cerebellar ataxia ([place])   . Dysesthesia 06/04/2016  . Essential hypertension 11/05/2019  . Foot pain, 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  . Gait disturbance 03/16/2019  . History of cervical spinal surgery 06/04/2016  . History of hiatal hernia   . History of kidney stones   . Hypertension   . 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 Memory loss 03/16/2019  . 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  . Pain due to onychomycosis of toenails of both feet 04/28/2019  . Pain in left ankle and joints of left foot 08/06/2018  . Slurred speech 03/16/2019  . 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 TIA (transient ischemic attack) 11/05/2019  . Worsened handwriting 03/16/2019    Past Surgical History:  Procedure Laterality Date  . APPENDECTOMY     1940s  . [place]  . FRACTURE SURGERY     Left ankle, sx x4  . INGUINAL HERNIA REPAIR Right 03/05/2019   Procedure: OPEN RIGHT INGUINAL HERNIA REPAIR WITH MESH;  Surgeon: Clovis Riley, MD;  Location: [facility];  Service: General;  Laterality: Right;  . KNEE SURGERY Left 1985  . SHOULDER SURGERY Left   . TONSILLECTOMY     1945    There were no vitals filed for this visit.   Subjective Assessment - 12/24/19 0906     Currently in Pain? Yes    Pain Score 4     Pain Location Ankle    Pain Orientation Left    Pain Descriptors / Indicators Pins and needles    Pain Type Chronic pain    Pain Onset More than a month ago    Pain Frequency Intermittent                 ADULT SLP TREATMENT - 12/25/19 0001      General Information   Behavior/Cognition Alert;Cooperative;Pleasant mood      Treatment Provided   Treatment provided Dysphagia;Cognitive-Linquistic      Dysphagia Treatment   Treatment Methods Skilled observation;Compensation strategy training;Patient/caregiver education    Patient observed directly with PO's Yes    Type of PO's observed Regular;Thin liquids    Liquids provided via Cup    Pharyngeal Phase Signs & Symptoms Wet vocal quality    Other treatment/comments Occasional cues needed for following precautions on MBSS - swallowing prior to talking which pt did 50% of the time.       Cognitive-Linquistic Treatment   Treatment focused on Dysarthria    Skilled Treatment SLP reviewed pt's HEP with him - demonstrated slow and big talking to pt with his HEP provided last session. SLP added "tongue twisters" to pt's HEP and  reviewed a portion of these with pt.       Dysphagia Recommendations   Diet recommendations Regular;Thin liquid    Compensations Small sips/bites   ensure pt swallowed prior to talking     Progression Toward Goals   Progression toward goals Progressing toward goals            SLP Education - 12/24/19 0921    Education Details compensations from modifeid (MBSS), "wet" voice requires strong throat clear and hard swallow    Person(s) Educated Patient    Methods Explanation;Demonstration;Verbal cues    Comprehension Verbalized understanding;Returned demonstration;Verbal cues required;Need further instruction            SLP Short Term Goals - 12/25/19 0039      SLP SHORT TERM GOAL #1   Title Pt will complete HEP for dysarthria and dysphagia with rare min A  over 2 sessions    Time 4    Period Weeks    Status On-going      SLP SHORT TERM GOAL #2   Title Pt will follow swallow precautions (pending MBSS results) with rare min A over 2 sessions    Time 4    Period Weeks    Status On-going      SLP SHORT TERM GOAL #3   Title Pt will say 18/20 personally relevant phrases/sentences with compensatory strategies for dysarthira with 100% intelligibilty with rare min A    Time 4    Period Weeks    Status On-going      SLP SHORT TERM GOAL #4   Title Pt will use compensatory strategies over phone conversation with friends/family and report 2 or less requests for repetition over 2 sessions    Time 4    Period Weeks    Status On-going            SLP Long Term Goals - 12/25/19 0039      SLP LONG TERM GOAL #1   Title Pt will be 95% intelligible over 25 minute complex conversation with rare min A over 2 sessions    Time 8    Period Weeks    Status On-going      SLP LONG TERM GOAL #2   Title Pt will report 2 or less requests for repeition on 3 business calls/calls to strangers over 2 sessions.    Time 8    Period Weeks    Status On-going      SLP LONG TERM GOAL #3   Title Pt will follow diet recommendations and swallow precautions with mod I over 2 sessions    Time 8    Period Weeks    Status On-going      SLP LONG TERM GOAL #4   Title Pt will improve score on Communicative Effectiveness Survey by 2 points (23 is original score)    Time 8    Period Weeks    Status On-going            Plan - 12/25/19 0034    Clinical Impression Statement Mr. Derrick White had a MBSS since last session. Swallow was deemed WNL with some mild residuals that cleared. Pt talked immediately following the swallow, consistently. Small sips and do not talk immedately after swallow were recommendations, along wiht meds in puree if large. 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 Derrick White was provided an expanded HEP for his dysarthria today and  req'd usual SLP cues to speak "loud and big". I recommend  cont'd MBSS with SLP. We scheduled this today for 12/22/19. Goals may be modified pending MBSS. I recommend skilled ST to maximize intelligibility and safety of swallow.    Speech Therapy Frequency 2x / week    Duration --   8 weeks or 17 visits   Treatment/Interventions Aspiration precaution training;Language facilitation;Environmental controls;Cueing hierarchy;Oral motor exercises;SLP instruction and feedback;Functional tasks;Compensatory strategies;Cognitive reorganization;Compensatory techniques;Pharyngeal strengthening exercises;Diet toleration management by SLP;Trials of upgraded texture/liquids;Internal/external aids;Multimodal communcation approach;Patient/family education    Potential to Achieve Goals Good    Consulted and Agree with Plan of Care Patient           Patient will benefit from skilled therapeutic intervention in order to improve the following deficits and impairments:   Dysphagia, unspecified type  Ataxic dysarthria    Problem List Patient Active Problem List   Diagnosis Date Noted  . TIA (transient ischemic attack) 11/05/2019  . Essential hypertension 11/05/2019  . 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 C6 radiculopathy 05/04/2019  . Pain due to onychomycosis of toenails of both feet 04/28/2019  . Worsened handwriting 03/16/2019  . Gait disturbance 03/16/2019  . Slurred speech 03/16/2019  . Memory loss 03/16/2019  . Pain in left ankle and joints of left foot 08/06/2018  . 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  . Foot pain, 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  . Dysesthesia 06/04/2016  . 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 History of cervical spinal surgery 06/04/2016    [facility] ,MS, CCC-SLP  12/25/2019, 12:39 AM  [facility] [address] [place], [place], [address] Phone: [phone]    Fax:  [phone]   Name: Derrick White MRN: [id number] Date of Birth: May 02, 1941

## 2019-12-27 ENCOUNTER — Ambulatory Visit: Payer: Medicare Other | Admitting: Speech Pathology

## 2019-12-27 ENCOUNTER — Ambulatory Visit: Payer: Medicare Other

## 2019-12-27 ENCOUNTER — Encounter: Payer: Self-pay | Admitting: Speech Pathology

## 2019-12-27 ENCOUNTER — Other Ambulatory Visit: Payer: Self-pay

## 2019-12-27 DIAGNOSIS — R471 Dysarthria and anarthria: Secondary | ICD-10-CM

## 2019-12-27 DIAGNOSIS — R270 Ataxia, unspecified: Secondary | ICD-10-CM | POA: Diagnosis not present

## 2019-12-27 DIAGNOSIS — R2681 Unsteadiness on feet: Secondary | ICD-10-CM

## 2019-12-27 DIAGNOSIS — R1312 Dysphagia, oropharyngeal phase: Secondary | ICD-10-CM | POA: Diagnosis not present

## 2019-12-27 DIAGNOSIS — M25572 Pain in left ankle and joints of left foot: Secondary | ICD-10-CM | POA: Diagnosis not present

## 2019-12-27 DIAGNOSIS — M6281 Muscle weakness (generalized): Secondary | ICD-10-CM | POA: Diagnosis not present

## 2019-12-27 DIAGNOSIS — G459 Transient cerebral ischemic attack, unspecified: Secondary | ICD-10-CM

## 2019-12-27 NOTE — Patient Instructions (Signed)
When you have trouble with longer words, break them up into syllables  Pause in between words  Derrick White, how's the real estate going?  How is Derrick White's hip injury?  Derrick White's golf is with [employer] sponsor  He plays [place], [place] and [place] goes fishing in [place].  Derrick White and I have been married for 57 years.  Keep a list of words that you have trouble with  Read aloud 5 minutes twice a day focusing on slow, loud, over enunciation and pausing between words/syllables.  Make sure you get the end sounds in (we usually don't, but it does help others understand you)  Ask Derrick White how your speech is doing  When you are talking with her, mute TV and move to a quiet place  Speech can worsen with fatigue  On the telephone focus on slowing down this week. If it helps, put a visual reminder on your phone or a loose rubber band around your wrist while you are on the phone to help you remember [place] a little bit more on the phone

## 2019-12-27 NOTE — Therapy (Signed)
[facility] [address] [place], [place], [address] Phone: [phone]   Fax:  [phone]  Speech Language Pathology Treatment  Patient Details  Name: Derrick White MRN: [id number] Date of Birth: Jul 25, 1940 Referring Provider (SLP): Dr. Domenick Gong   Encounter Date: 12/27/2019   End of Session - 12/27/19 1107    Visit Number 3    Number of Visits 17    Date for SLP Re-Evaluation 01/31/20    SLP Start Time 1017    SLP Stop Time  1540    SLP Time Calculation (min) 41 min           Past Medical History:  Diagnosis Date  . C6 radiculopathy 05/04/2019  . Cerebellar ataxia ([place])   . Dysesthesia 06/04/2016  . Essential hypertension 11/05/2019  . Foot pain, 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  . Gait disturbance 03/16/2019  . History of cervical spinal surgery 06/04/2016  . History of hiatal hernia   . History of kidney stones   . Hypertension   . 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 Memory loss 03/16/2019  . 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  . Pain due to onychomycosis of toenails of both feet 04/28/2019  . Pain in left ankle and joints of left foot 08/06/2018  . Slurred speech 03/16/2019  . 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 TIA (transient ischemic attack) 11/05/2019  . Worsened handwriting 03/16/2019    Past Surgical History:  Procedure Laterality Date  . APPENDECTOMY     1940s  . [place]  . FRACTURE SURGERY     Left ankle, sx x4  . INGUINAL HERNIA REPAIR Right 03/05/2019   Procedure: OPEN RIGHT INGUINAL HERNIA REPAIR WITH MESH;  Surgeon: Clovis Riley, MD;  Location: [facility];  Service: General;  Laterality: Right;  . KNEE SURGERY Left 1985  . SHOULDER SURGERY Left   . TONSILLECTOMY     1945    There were no vitals filed for this visit.   Subjective Assessment - 12/27/19 1015    Subjective "The lady said to send her regards"    Currently in Pain? Yes    Pain  Score 3     Pain Location Ankle    Pain Orientation Left    Pain Descriptors / Indicators Aching;Tightness    Pain Type Chronic pain    Pain Onset More than a month ago    Pain Frequency Intermittent                 ADULT SLP TREATMENT - 12/27/19 1020      General Information   Behavior/Cognition Alert;Cooperative;Pleasant mood      Treatment Provided   Treatment provided Cognitive-Linquistic;Dysphagia      Cognitive-Linquistic Treatment   Treatment focused on Dysarthria;Patient/family/caregiver education    Skilled Treatment HEP with usual verbal cues and modeling for slow rate, pause with syllables and over articulation, including last sound. Generated 6 personally relevant sentences for practice. Multisyllabic words targeted at word and sentence level  use of compensations. Occasional min A for SLOP in this structured task. Discussed phone conversations, pt is to practice slow rate and projecting over phone.       Dysphagia Recommendations   Diet recommendations Regular;Thin liquid    Compensations Small sips/bites   no talking until after swallow   Postural Changes and/or Swallow Maneuvers Upright 30-60 min after meal      Progression Toward Goals   Progression toward  goals Progressing toward goals            SLP Education - 12/27/19 1104    Education Details compensations for dysarthira (internal and external), compensations over the phone, HEP    Person(s) Educated Patient    Methods Explanation;Demonstration;Verbal cues;Handout    Comprehension Verbalized understanding;Returned demonstration;Verbal cues required;Need further instruction            SLP Short Term Goals - 12/27/19 1106      SLP SHORT TERM GOAL #1   Title Pt will complete HEP for dysarthria and dysphagia with rare min A over 2 sessions    Time 3    Period Weeks    Status On-going      SLP SHORT TERM GOAL #2   Title Pt will follow swallow precautions (pending MBSS results) with rare min  A over 2 sessions    Time 3    Period Weeks    Status On-going      SLP SHORT TERM GOAL #3   Title Pt will say 18/20 personally relevant phrases/sentences with compensatory strategies for dysarthira with 100% intelligibilty with rare min A    Time 3    Period Weeks    Status On-going      SLP SHORT TERM GOAL #4   Title Pt will use compensatory strategies over phone conversation with friends/family and report 2 or less requests for repetition over 2 sessions    Time 3    Period Weeks    Status On-going            SLP Long Term Goals - 12/27/19 1107      SLP LONG TERM GOAL #1   Title Pt will be 95% intelligible over 25 minute complex conversation with rare min A over 2 sessions    Time 7    Period Weeks    Status On-going      SLP LONG TERM GOAL #2   Title Pt will report 2 or less requests for repeition on 3 business calls/calls to strangers over 2 sessions.    Time 8    Period Weeks    Status On-going      SLP LONG TERM GOAL #3   Title Pt will follow diet recommendations and swallow precautions with mod I over 2 sessions    Time 7    Period Weeks    Status On-going      SLP LONG TERM GOAL #4   Title Pt will improve score on Communicative Effectiveness Survey by 2 points (23 is original score)    Time 7    Period Weeks    Status On-going            Plan - 12/27/19 1105    Clinical Impression Statement Mr. Derrick White had a MBSS since last session. Swallow was deemed WNL with some mild residuals that cleared. Pt talked immediately following the swallow, consistently. Small sips and do not talk immedately after swallow were recommendations, along wiht meds in puree if large. 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 Derrick White was provided an expanded HEP for his dysarthria today and req'd usual SLP cues to speak "loud and big"  with usual to occasional verbal cues and modeling.  I recommend skilled ST to maximize intelligibility and safety of swallow.      Speech Therapy Frequency 2x / week    Duration --   8 weeks or 17 visits   Treatment/Interventions Aspiration precaution training;Language facilitation;Environmental  controls;Cueing hierarchy;Oral motor exercises;SLP instruction and feedback;Functional tasks;Compensatory strategies;Cognitive reorganization;Compensatory techniques;Pharyngeal strengthening exercises;Diet toleration management by SLP;Trials of upgraded texture/liquids;Internal/external aids;Multimodal communcation approach;Patient/family education    Potential to Achieve Goals Good           Patient will benefit from skilled therapeutic intervention in order to improve the following deficits and impairments:   Ataxic dysarthria  Dysphagia, oropharyngeal phase    Problem List Patient Active Problem List   Diagnosis Date Noted  . TIA (transient ischemic attack) 11/05/2019  . Essential hypertension 11/05/2019  . 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 C6 radiculopathy 05/04/2019  . Pain due to onychomycosis of toenails of both feet 04/28/2019  . Worsened handwriting 03/16/2019  . Gait disturbance 03/16/2019  . Slurred speech 03/16/2019  . Memory loss 03/16/2019  . Pain in left ankle and joints of left foot 08/06/2018  . 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  . Foot pain, 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  . Dysesthesia 06/04/2016  . 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 History of cervical spinal surgery 06/04/2016    Derrick White, Annye Rusk MS, CCC-SLP 12/27/2019, 12:16 PM  [facility] [address] [place], [place], [address] Phone: [phone]   Fax:  [phone]   Name: KROSBY RITCHIE MRN: [id number] Date of Birth: 29-Nov-1940

## 2019-12-27 NOTE — Therapy (Signed)
[facility] [address] [place], [place], [address] Phone: [phone]   Fax:  [phone]  Physical Therapy Treatment  Patient Details  Name: Derrick White MRN: [id number] Date of Birth: August 10, 1940 Referring Provider (PT): Dr. Domenick Gong   Encounter Date: 12/27/2019   PT End of Session - 12/27/19 1003    Visit Number 6    Number of Visits 13    Date for PT Re-Evaluation 01/10/20    Authorization Type Medicare    Progress Note Due on Visit 10    PT Start Time 0930    PT Stop Time 1015    PT Time Calculation (min) 45 min    Equipment Utilized During Treatment Gait belt    Activity Tolerance Patient tolerated treatment well    Behavior During Therapy [facility] for tasks assessed/performed           Past Medical History:  Diagnosis Date  . C6 radiculopathy 05/04/2019  . Cerebellar ataxia ([place])   . Dysesthesia 06/04/2016  . Essential hypertension 11/05/2019  . Foot pain, 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  . Gait disturbance 03/16/2019  . History of cervical spinal surgery 06/04/2016  . History of hiatal hernia   . History of kidney stones   . Hypertension   . 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 Memory loss 03/16/2019  . 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  . Pain due to onychomycosis of toenails of both feet 04/28/2019  . Pain in left ankle and joints of left foot 08/06/2018  . Slurred speech 03/16/2019  . 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 TIA (transient ischemic attack) 11/05/2019  . Worsened handwriting 03/16/2019    Past Surgical History:  Procedure Laterality Date  . APPENDECTOMY     1940s  . [place]  . FRACTURE SURGERY     Left ankle, sx x4  . INGUINAL HERNIA REPAIR Right 03/05/2019   Procedure: OPEN RIGHT INGUINAL HERNIA REPAIR WITH MESH;  Surgeon: Clovis Riley, MD;  Location: [facility];  Service: General;  Laterality: Right;  . KNEE SURGERY Left 1985  .  SHOULDER SURGERY Left   . TONSILLECTOMY     1945    There were no vitals filed for this visit.   Subjective Assessment - 12/27/19 1002    Subjective Pt reports pain is better.    Pertinent History HTN, cerebellar ataxia, oropharyngeal dysphagia, Sialorrhea, Chronic R ankle pain    Limitations Walking;Other (comment)   stairs   How long can you sit comfortably? no issues    How long can you stand comfortably? 10 min    How long can you walk comfortably? 200 yards    Diagnostic tests 11/05/19: IMPRESSION:1. 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 Moderate chronic small vessel ischemic disease.3. Motion degraded head MRA without evidence of a large or mediumvessel occlusion or significant proximal stenosis.4. Negative neck MRA.  IMPRESSION:1. 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 Mild spinal stenosis and mild-to-moderate neural foraminalstenosis at C6-7, stable to minimally progressed.    Patient Stated Goals walk 1 mile comfortably, come down stairs better    Pain Onset More than a month ago                    Manual therapy Myofascial release to anterior tib border, Lateral malleolus, interosseous space, scar tissue in foot and ankle  TherEx: Toe curls seated on towel: 20x Towel scrunch: 50x Supine hooklying ankle inversion, eversion: red band: 20x R and L Tandem stance: 3 x 30" R and L SLS: 5 x 15" R and L, able to stand on R leg for 18 sec most, 20 sec most on L LE 5x sit to stand Turning 360 deg: 3x R and L in square, 3x without squre but cues to pick up his feet and turning least amout of steps that he can Cross over step with diagonal reaching: 10x R and L                       PT Short Term Goals - 12/24/19 1051      PT SHORT TERM GOAL #1   Title Patient will be able to perform sit to stand without HHA without pushing knees  against chair and with one trial only    Baseline pushes back of knee agianst chair, takes several tries; able to perform sit to stand without arm support (12/24/19)    Time 3    Period Weeks    Status Achieved    Target Date 12/20/19      PT SHORT TERM GOAL #2   Title Patient will be able to ambulate 25 min with straight cane for 3x/week to improve compliance with walking program    Baseline 200 yards, able to walk 3/4 to 1 mile with one walking stick (12/24/19)    Time 3    Period Weeks    Status Achieved    Target Date 12/20/19      PT SHORT TERM GOAL #3   Title Patient will be able to come down stairs with reciprocal gait pattern and use of one rail to improve ability to come down stairs without significant ankle pain    Baseline step to pattern, increased L aknle pain with reciprocal gait pattern    Time 3    Period Weeks    Status New    Target Date 12/20/19             PT Long Term Goals - 11/29/19 1218      PT LONG TERM GOAL #1   Title Patient will demo <12 seconds on normal TUG to improve functional mobility and transfers    Baseline 16 seconds (normal); 20 seconds with carrying 15lbs in R hand; 19 sec with carrying 15lbs in L Hand    Time 6    Period Weeks    Status New    Target Date 01/10/20      PT LONG TERM GOAL #2   Title Patient will be able to ambulate for 40 min for 2x/week to improve walking endurance    Baseline 200 yards    Time 6    Period Weeks    Status New    Target Date 12/20/19                 Plan - 12/27/19 1002    Clinical Impression Statement myofascial mobility is improving in foot and ankle in L LE. Patient is able to curl toes more on left foot and reporting decreasing pain.    Personal Factors and Comorbidities Age;Comorbidity 2;Past/Current Experience;Time since onset of injury/illness/exacerbation    Comorbidities HTN, cerebellar ataxia, oropharyngeal dysphagia, Sialorrhea, Chronic R ankle pain    Examination-Activity  Limitations Carry;Lift;Squat;Stairs;Stand;Transfers    Examination-Participation Restrictions Community Activity;Laundry;Shop;Yard Work    Product manager  Rehab Potential Good    PT Treatment/Interventions ADLs/Self Care Home Management;Gait training;Stair training;Functional mobility training;Therapeutic exercise;Therapeutic activities;Balance training;Neuromuscular re-education;Manual techniques;Patient/family education;Passive range of motion;Visual/perceptual remediation/compensation;Joint Manipulations    PT Next Visit Plan 6 MWT, work on sit to stand without HHA, coming down stairs with rail    PT Home Exercise Plan Walking program: walk 20 min with cane 3x/week;Access Code: [email or web address]    Consulted and Agree with Plan of Care Patient           Patient will benefit from skilled therapeutic intervention in order to improve the following deficits and impairments:  Abnormal gait, Decreased balance, Decreased activity tolerance, Decreased endurance, Decreased coordination, Decreased mobility, Decreased range of motion, Decreased strength, Difficulty walking, Postural dysfunction, Pain  Visit Diagnosis: Ataxia  Muscle weakness (generalized)  Pain in left ankle and joints of left foot  Unsteadiness on feet  TIA (transient ischemic attack)     Problem List Patient Active Problem List   Diagnosis Date Noted  . TIA (transient ischemic attack) 11/05/2019  . Essential hypertension 11/05/2019  . 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 C6 radiculopathy 05/04/2019  . Pain due to onychomycosis of toenails of both feet 04/28/2019  . Worsened handwriting 03/16/2019  . Gait disturbance 03/16/2019  . Slurred speech 03/16/2019  . Memory loss 03/16/2019  . Pain in left ankle and joints of left foot 08/06/2018  . 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  . Foot pain, 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  . Dysesthesia  06/04/2016  . 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 History of cervical spinal surgery 06/04/2016    Kerrie Pleasure, PT 12/27/2019, 10:05 AM  [facility] [address] [place], [place], [address] Phone: [phone]   Fax:  [phone]  Name: KEJON FEILD MRN: [id number] Date of Birth: February 10, 1941

## 2019-12-31 ENCOUNTER — Ambulatory Visit: Payer: Medicare Other

## 2019-12-31 ENCOUNTER — Other Ambulatory Visit: Payer: Self-pay

## 2019-12-31 DIAGNOSIS — M25572 Pain in left ankle and joints of left foot: Secondary | ICD-10-CM | POA: Diagnosis not present

## 2019-12-31 DIAGNOSIS — G459 Transient cerebral ischemic attack, unspecified: Secondary | ICD-10-CM

## 2019-12-31 DIAGNOSIS — R2681 Unsteadiness on feet: Secondary | ICD-10-CM | POA: Diagnosis not present

## 2019-12-31 DIAGNOSIS — M6281 Muscle weakness (generalized): Secondary | ICD-10-CM | POA: Diagnosis not present

## 2019-12-31 DIAGNOSIS — R270 Ataxia, unspecified: Secondary | ICD-10-CM | POA: Diagnosis not present

## 2019-12-31 DIAGNOSIS — R1312 Dysphagia, oropharyngeal phase: Secondary | ICD-10-CM

## 2019-12-31 DIAGNOSIS — R471 Dysarthria and anarthria: Secondary | ICD-10-CM

## 2019-12-31 NOTE — Patient Instructions (Signed)
  Please complete the assigned speech therapy homework prior to your next session and return it to the speech therapist at your next visit.  

## 2019-12-31 NOTE — Therapy (Signed)
[facility] [address] [place], [place], [address] Phone: [phone]   Fax:  [phone]  Speech Language Pathology Treatment  Patient Details  Name: Derrick White MRN: [id number] Date of Birth: 05-10-1941 Referring Provider (SLP): Dr. Domenick Gong   Encounter Date: 12/31/2019   End of Session - 12/31/19 1249    Visit Number 4    Number of Visits 17    Date for SLP Re-Evaluation 01/31/20    SLP Start Time 0850    SLP Stop Time  0930    SLP Time Calculation (min) 40 min    Activity Tolerance Patient tolerated treatment well           Past Medical History:  Diagnosis Date  . C6 radiculopathy 05/04/2019  . Cerebellar ataxia ([place])   . Dysesthesia 06/04/2016  . Essential hypertension 11/05/2019  . Foot pain, 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  . Gait disturbance 03/16/2019  . History of cervical spinal surgery 06/04/2016  . History of hiatal hernia   . History of kidney stones   . Hypertension   . 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 Memory loss 03/16/2019  . 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  . Pain due to onychomycosis of toenails of both feet 04/28/2019  . Pain in left ankle and joints of left foot 08/06/2018  . Slurred speech 03/16/2019  . 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 TIA (transient ischemic attack) 11/05/2019  . Worsened handwriting 03/16/2019    Past Surgical History:  Procedure Laterality Date  . APPENDECTOMY     1940s  . [place]  . FRACTURE SURGERY     Left ankle, sx x4  . INGUINAL HERNIA REPAIR Right 03/05/2019   Procedure: OPEN RIGHT INGUINAL HERNIA REPAIR WITH MESH;  Surgeon: Clovis Riley, MD;  Location: [facility];  Service: General;  Laterality: Right;  . KNEE SURGERY Left 1985  . SHOULDER SURGERY Left   . TONSILLECTOMY     1945    There were no vitals filed for this visit.   Subjective Assessment - 12/31/19 0900    Subjective "Good morning  Glendell Docker."    Currently in Pain? Yes    Pain Score 3     Pain Location Ankle    Pain Orientation Left    Pain Descriptors / Indicators Aching;Tightness    Pain Type Chronic pain    Pain Onset More than a month ago    Pain Frequency Constant                 ADULT SLP TREATMENT - 12/31/19 0902      General Information   Behavior/Cognition Alert;Cooperative;Pleasant mood      Treatment Provided   Treatment provided Cognitive-Linquistic      Cognitive-Linquistic Treatment   Treatment focused on Dysarthria    Skilled Treatment Pt entered the ST room with more intelligible speech than previous ST session with this SLP. In phrase responses pt used copmensatory speech strategies 80% of the time with 90-95% intelligibility. In sentence tasks pt appeared to use compensatory strategies of slowed rate and overarticulation ("Speaking as distrinctly as possible") 75% of the time with 90% intelligibilty. PT with more difficulty with multisyllabic words. SLP cued pt occasionally in simple conversation with request for repeats but this did not impact pt's carryover of strategies practiced in structured tasks. "I think talking slower is going to be the key for me, Glendell Docker." SLP educated/reiterated that slow rate is  certainly a component in more intelligible speech. SLP encouraged pt to cont to practice HEPs for speech and for dysphagia.       Dysphagia Recommendations   Diet recommendations Regular;Thin liquid    Compensations Small sips/bites   swallow again instead of talk immediately after swallow     Progression Toward Goals   Progression toward goals Progressing toward goals              SLP Short Term Goals - 12/31/19 1251      SLP SHORT TERM GOAL #1   Title Pt will complete HEP for dysarthria and dysphagia with rare min A over 2 sessions    Time 3    Period Weeks    Status On-going      SLP SHORT TERM GOAL #2   Title Pt will follow swallow precautions (pending MBSS results) with  rare min A over 2 sessions    Time 3    Period Weeks    Status On-going      SLP SHORT TERM GOAL #3   Title Pt will say 18/20 personally relevant phrases/sentences with compensatory strategies for dysarthira with 100% intelligibilty with rare min A    Time 3    Period Weeks    Status On-going      SLP SHORT TERM GOAL #4   Title Pt will use compensatory strategies over phone conversation with friends/family and report 2 or less requests for repetition over 2 sessions    Time 3    Period Weeks    Status On-going            SLP Long Term Goals - 12/31/19 1251      SLP LONG TERM GOAL #1   Title Pt will be 95% intelligible over 25 minute complex conversation with rare min A over 2 sessions    Time 7    Period Weeks    Status On-going      SLP LONG TERM GOAL #2   Title Pt will report 2 or less requests for repeition on 3 business calls/calls to strangers over 2 sessions.    Time 8    Period Weeks    Status On-going      SLP LONG TERM GOAL #3   Title Pt will follow diet recommendations and swallow precautions with mod I over 2 sessions    Time 7    Period Weeks    Status On-going      SLP LONG TERM GOAL #4   Title Pt will improve score on Communicative Effectiveness Survey by 2 points (23 is original score)    Time 7    Period Weeks    Status On-going            Plan - 12/31/19 1250    Clinical Impression Statement Mr. 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 Mattias worked on his dysarthria today and req'd SLP cues to speak using compensations for dysarthira in structured tasks. I recommend skilled ST to maximize intelligibility and safety of swallow.    Speech Therapy Frequency 2x / week    Duration --   8 weeks or 17 visits   Treatment/Interventions Aspiration precaution training;Language facilitation;Environmental controls;Cueing hierarchy;Oral motor exercises;SLP instruction and feedback;Functional tasks;Compensatory strategies;Cognitive  reorganization;Compensatory techniques;Pharyngeal strengthening exercises;Diet toleration management by SLP;Trials of upgraded texture/liquids;Internal/external aids;Multimodal communcation approach;Patient/family education    Potential to Achieve Goals Good           Patient will benefit from  skilled therapeutic intervention in order to improve the following deficits and impairments:   Dysphagia, oropharyngeal phase  Ataxic dysarthria    Problem List Patient Active Problem List   Diagnosis Date Noted  . TIA (transient ischemic attack) 11/05/2019  . Essential hypertension 11/05/2019  . 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 C6 radiculopathy 05/04/2019  . Pain due to onychomycosis of toenails of both feet 04/28/2019  . Worsened handwriting 03/16/2019  . Gait disturbance 03/16/2019  . Slurred speech 03/16/2019  . Memory loss 03/16/2019  . Pain in left ankle and joints of left foot 08/06/2018  . 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  . Foot pain, 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  . Dysesthesia 06/04/2016  . 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 History of cervical spinal surgery 06/04/2016    [facility] ,MS, CCC-SLP  12/31/2019, 12:52 PM  [facility] [address] [place], [place], [address] Phone: [phone]   Fax:  [phone]   Name: YVETTE ROARK MRN: [id number] Date of Birth: 03/03/1941

## 2019-12-31 NOTE — Therapy (Signed)
[facility] [address] [place], [place], [address] Phone: [phone]   Fax:  [phone]  Physical Therapy Treatment  Patient Details  Name: Derrick White MRN: [id number] Date of Birth: 01-14-1941 Referring Provider (PT): Dr. Domenick Gong   Encounter Date: 12/31/2019   PT End of Session - 12/31/19 1209    Visit Number 7    Number of Visits 13    Date for PT Re-Evaluation 01/10/20    Authorization Type Medicare    Progress Note Due on Visit 10    PT Start Time 0930    PT Stop Time 1015    PT Time Calculation (min) 45 min    Equipment Utilized During Treatment Gait belt    Activity Tolerance Patient tolerated treatment well    Behavior During Therapy [facility] for tasks assessed/performed           Past Medical History:  Diagnosis Date  . C6 radiculopathy 05/04/2019  . Cerebellar ataxia ([place])   . Dysesthesia 06/04/2016  . Essential hypertension 11/05/2019  . Foot pain, 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  . Gait disturbance 03/16/2019  . History of cervical spinal surgery 06/04/2016  . History of hiatal hernia   . History of kidney stones   . Hypertension   . 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 Memory loss 03/16/2019  . 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  . Pain due to onychomycosis of toenails of both feet 04/28/2019  . Pain in left ankle and joints of left foot 08/06/2018  . Slurred speech 03/16/2019  . 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 TIA (transient ischemic attack) 11/05/2019  . Worsened handwriting 03/16/2019    Past Surgical History:  Procedure Laterality Date  . APPENDECTOMY     1940s  . [place]  . FRACTURE SURGERY     Left ankle, sx x4  . INGUINAL HERNIA REPAIR Right 03/05/2019   Procedure: OPEN RIGHT INGUINAL HERNIA REPAIR WITH MESH;  Surgeon: Clovis Riley, MD;  Location: [facility];  Service: General;  Laterality: Right;  . KNEE SURGERY Left 1985  .  SHOULDER SURGERY Left   . TONSILLECTOMY     1945    There were no vitals filed for this visit.   Subjective Assessment - 12/31/19 1206    Subjective Pt reports his neighbours and wife have commented that he is walking better. Ankle still hurts with first 10 steps and walking 1 mile, his ankle pain is about 3-4/10    Pertinent History HTN, cerebellar ataxia, oropharyngeal dysphagia, Sialorrhea, Chronic R ankle pain    Limitations Walking;Other (comment)   stairs   How long can you sit comfortably? no issues    How long can you stand comfortably? 10 min    How long can you walk comfortably? 200 yards    Diagnostic tests 11/05/19: IMPRESSION:1. 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 Moderate chronic small vessel ischemic disease.3. Motion degraded head MRA without evidence of a large or mediumvessel occlusion or significant proximal stenosis.4. Negative neck MRA.  IMPRESSION:1. 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 Mild spinal stenosis and mild-to-moderate neural foraminalstenosis at C6-7, stable to minimally progressed.    Patient Stated Goals walk 1 mile comfortably, come down stairs better    Pain Onset More than a month ago  Manual therapy Myofascial release to anterior tib border, Lateral malleolus, interosseous space, scar tissue in foot and ankle Grade IV tarsalphalangeal joint mobilization to improve toe flexion, Grade IV mid foot mobilization  Therex Manually resisted toe flexion/ankle plantar flexion to toe extension/ankle dorsiflexion: 20x Standing bil heel raises: 20x Leg press: unilateral heel raise: 40lbs L only, pt had difficulty with isolating ankle movement Seated unilateral heel raise with 20lb kettle ball on L knee 15x, 30lb kettel ball: 2 x 20  Heel walk and toe walk: 2 x 20 feet 1 HHA Tandem walk: 20 feet fwd  only                          PT Short Term Goals - 12/24/19 1051      PT SHORT TERM GOAL #1   Title Patient will be able to perform sit to stand without HHA without pushing knees against chair and with one trial only    Baseline pushes back of knee agianst chair, takes several tries; able to perform sit to stand without arm support (12/24/19)    Time 3    Period Weeks    Status Achieved    Target Date 12/20/19      PT SHORT TERM GOAL #2   Title Patient will be able to ambulate 25 min with straight cane for 3x/week to improve compliance with walking program    Baseline 200 yards, able to walk 3/4 to 1 mile with one walking stick (12/24/19)    Time 3    Period Weeks    Status Achieved    Target Date 12/20/19      PT SHORT TERM GOAL #3   Title Patient will be able to come down stairs with reciprocal gait pattern and use of one rail to improve ability to come down stairs without significant ankle pain    Baseline step to pattern, increased L aknle pain with reciprocal gait pattern    Time 3    Period Weeks    Status New    Target Date 12/20/19             PT Long Term Goals - 11/29/19 1218      PT LONG TERM GOAL #1   Title Patient will demo <12 seconds on normal TUG to improve functional mobility and transfers    Baseline 16 seconds (normal); 20 seconds with carrying 15lbs in R hand; 19 sec with carrying 15lbs in L Hand    Time 6    Period Weeks    Status New    Target Date 01/10/20      PT LONG TERM GOAL #2   Title Patient will be able to ambulate for 40 min for 2x/week to improve walking endurance    Baseline 200 yards    Time 6    Period Weeks    Status New    Target Date 12/20/19                 Plan - 12/31/19 1207    Clinical Impression Statement Patient continues to demonstrate significant mobility deficit in Left forefoot (in tarsalphalangeal joints). Mobility in rear foot is improving signiticantly. Patient still has myofascial  restrictions in ankle musculature that is causing pain with prolonged activity. Patient has decreased strength in L ankle plantarflexors compared to R ankle plantarflexors with functional heel raises as he gets trired quickly and compensates to use UE more.    Personal Factors  and Comorbidities Age;Comorbidity 2;Past/Current Experience;Time since onset of injury/illness/exacerbation    Comorbidities HTN, cerebellar ataxia, oropharyngeal dysphagia, Sialorrhea, Chronic R ankle pain    Examination-Activity Limitations Carry;Lift;Squat;Stairs;Stand;Transfers    Examination-Participation Restrictions Community Activity;Laundry;Shop;Yard Work    Stability/Clinical Decision Making Evolving/Moderate complexity    Rehab Potential Good    PT Treatment/Interventions ADLs/Self Care Home Management;Gait training;Stair training;Functional mobility training;Therapeutic exercise;Therapeutic activities;Balance training;Neuromuscular re-education;Manual techniques;Patient/family education;Passive range of motion;Visual/perceptual remediation/compensation;Joint Manipulations    PT Next Visit Plan 6 MWT, work on sit to stand without HHA, coming down stairs with rail    PT Home Exercise Plan Walking program: walk 20 min with cane 3x/week;Access Code: [email or web address]    Consulted and Agree with Plan of Care Patient           Patient will benefit from skilled therapeutic intervention in order to improve the following deficits and impairments:  Abnormal gait, Decreased balance, Decreased activity tolerance, Decreased endurance, Decreased coordination, Decreased mobility, Decreased range of motion, Decreased strength, Difficulty walking, Postural dysfunction, Pain  Visit Diagnosis: Ataxia  Muscle weakness (generalized)  Pain in left ankle and joints of left foot  Unsteadiness on feet  TIA (transient ischemic attack)     Problem List Patient Active Problem List   Diagnosis Date Noted  . TIA (transient ischemic  attack) 11/05/2019  . Essential hypertension 11/05/2019  . 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 C6 radiculopathy 05/04/2019  . Pain due to onychomycosis of toenails of both feet 04/28/2019  . Worsened handwriting 03/16/2019  . Gait disturbance 03/16/2019  . Slurred speech 03/16/2019  . Memory loss 03/16/2019  . Pain in left ankle and joints of left foot 08/06/2018  . 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  . Foot pain, 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  . Dysesthesia 06/04/2016  . 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 History of cervical spinal surgery 06/04/2016    Kerrie Pleasure 12/31/2019, 12:10 PM  [facility] [address] [place], [place], [address] Phone: [phone]   Fax:  [phone]  Name: LEOPOLDO MAZZIE MRN: [id number] Date of Birth: [birth year]

## 2020-01-03 ENCOUNTER — Ambulatory Visit: Payer: Medicare Other | Admitting: Speech Pathology

## 2020-01-03 ENCOUNTER — Ambulatory Visit: Payer: Medicare Other

## 2020-01-03 ENCOUNTER — Other Ambulatory Visit: Payer: Self-pay

## 2020-01-03 DIAGNOSIS — M6281 Muscle weakness (generalized): Secondary | ICD-10-CM | POA: Diagnosis not present

## 2020-01-03 DIAGNOSIS — G459 Transient cerebral ischemic attack, unspecified: Secondary | ICD-10-CM

## 2020-01-03 DIAGNOSIS — M25572 Pain in left ankle and joints of left foot: Secondary | ICD-10-CM

## 2020-01-03 DIAGNOSIS — R1312 Dysphagia, oropharyngeal phase: Secondary | ICD-10-CM | POA: Diagnosis not present

## 2020-01-03 DIAGNOSIS — R270 Ataxia, unspecified: Secondary | ICD-10-CM

## 2020-01-03 DIAGNOSIS — R2681 Unsteadiness on feet: Secondary | ICD-10-CM | POA: Diagnosis not present

## 2020-01-03 NOTE — Therapy (Signed)
[facility] [address] [place], [place], [address] Phone: [phone]   Fax:  [phone]  Physical Therapy Treatment  Patient Details  Name: Derrick White MRN: [id number] Date of Birth: 05-Aug-1940 Referring Provider (PT): Dr. Domenick Gong   Encounter Date: 01/03/2020   PT End of Session - 01/03/20 1015    Visit Number 8    Number of Visits 13    Date for PT Re-Evaluation 01/10/20    Authorization Type Medicare    Progress Note Due on Visit 10    PT Start Time 0930    PT Stop Time 1015    PT Time Calculation (min) 45 min    Equipment Utilized During Treatment Gait belt    Activity Tolerance Patient tolerated treatment well    Behavior During Therapy [facility] for tasks assessed/performed           Past Medical History:  Diagnosis Date  . C6 radiculopathy 05/04/2019  . Cerebellar ataxia ([place])   . Dysesthesia 06/04/2016  . Essential hypertension 11/05/2019  . Foot pain, 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  . Gait disturbance 03/16/2019  . History of cervical spinal surgery 06/04/2016  . History of hiatal hernia   . History of kidney stones   . Hypertension   . 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 Memory loss 03/16/2019  . 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  . Pain due to onychomycosis of toenails of both feet 04/28/2019  . Pain in left ankle and joints of left foot 08/06/2018  . Slurred speech 03/16/2019  . 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 TIA (transient ischemic attack) 11/05/2019  . Worsened handwriting 03/16/2019    Past Surgical History:  Procedure Laterality Date  . APPENDECTOMY     1940s  . [place]  . FRACTURE SURGERY     Left ankle, sx x4  . INGUINAL HERNIA REPAIR Right 03/05/2019   Procedure: OPEN RIGHT INGUINAL HERNIA REPAIR WITH MESH;  Surgeon: Clovis Riley, MD;  Location: [facility];  Service: General;  Laterality: Right;  . KNEE SURGERY Left 1985  .  SHOULDER SURGERY Left   . TONSILLECTOMY     1945    There were no vitals filed for this visit.   Subjective Assessment - 01/03/20 1004    Subjective pt reports due to rain he feels his ankle pain more.    Pertinent History HTN, cerebellar ataxia, oropharyngeal dysphagia, Sialorrhea, Chronic R ankle pain    Limitations Walking;Other (comment)   stairs   How long can you sit comfortably? no issues    How long can you stand comfortably? 10 min    How long can you walk comfortably? 200 yards    Diagnostic tests 11/05/19: IMPRESSION:1. 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 Moderate chronic small vessel ischemic disease.3. Motion degraded head MRA without evidence of a large or mediumvessel occlusion or significant proximal stenosis.4. Negative neck MRA.  IMPRESSION:1. 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 Mild spinal stenosis and mild-to-moderate neural foraminalstenosis at C6-7, stable to minimally progressed.    Patient Stated Goals walk 1 mile comfortably, come down stairs better    Pain Score 4     Pain Location Ankle    Pain Orientation Left    Pain Onset More than a month ago  Manual therapy Myofascial release to anterior tib border, Lateral malleolus, interosseous space, scar tissue in foot and ankle Grade IV tarsalphalangeal joint mobilization to improve toe flexion, Grade IV mid foot mobilization  Therex Manually resisted toes flexion and extension: 20x L only 1/2 foam roll unilateral gastroc stretching: 3 x 30" R and L 1/2 foam roll heel raises: 2 x 10 1/2 foam roll tandem balance (flat side down): 3 x 30" R and L Standing on airex with narrow BOS and EC: 1' Standing on airex with EO and marching: 30x R and L                        PT Short Term Goals - 12/24/19 1051      PT SHORT TERM GOAL #1   Title  Patient will be able to perform sit to stand without HHA without pushing knees against chair and with one trial only    Baseline pushes back of knee agianst chair, takes several tries; able to perform sit to stand without arm support (12/24/19)    Time 3    Period Weeks    Status Achieved    Target Date 12/20/19      PT SHORT TERM GOAL #2   Title Patient will be able to ambulate 25 min with straight cane for 3x/week to improve compliance with walking program    Baseline 200 yards, able to walk 3/4 to 1 mile with one walking stick (12/24/19)    Time 3    Period Weeks    Status Achieved    Target Date 12/20/19      PT SHORT TERM GOAL #3   Title Patient will be able to come down stairs with reciprocal gait pattern and use of one rail to improve ability to come down stairs without significant ankle pain    Baseline step to pattern, increased L aknle pain with reciprocal gait pattern    Time 3    Period Weeks    Status New    Target Date 12/20/19             PT Long Term Goals - 11/29/19 1218      PT LONG TERM GOAL #1   Title Patient will demo <12 seconds on normal TUG to improve functional mobility and transfers    Baseline 16 seconds (normal); 20 seconds with carrying 15lbs in R hand; 19 sec with carrying 15lbs in L Hand    Time 6    Period Weeks    Status New    Target Date 01/10/20      PT LONG TERM GOAL #2   Title Patient will be able to ambulate for 40 min for 2x/week to improve walking endurance    Baseline 200 yards    Time 6    Period Weeks    Status New    Target Date 12/20/19                 Plan - 01/03/20 1005    Clinical Impression Statement Today's skilled session was focused on improivng soft tissue mobility in L ankle and foot. Patient is hitting a plateau with pain in last 2 sessions as he is not reporting any further improvements from 3-4/10 pain in Left ankle.    Personal Factors and Comorbidities Age;Comorbidity 2;Past/Current Experience;Time since  onset of injury/illness/exacerbation    Comorbidities HTN, cerebellar ataxia, oropharyngeal dysphagia, Sialorrhea, Chronic R ankle pain    Examination-Activity Limitations Carry;Lift;Squat;Stairs;Stand;Transfers  Examination-Participation Restrictions Community Activity;Laundry;Shop;Yard Work    Stability/Clinical Decision Making Evolving/Moderate complexity    Rehab Potential Good    PT Treatment/Interventions ADLs/Self Care Home Management;Gait training;Stair training;Functional mobility training;Therapeutic exercise;Therapeutic activities;Balance training;Neuromuscular re-education;Manual techniques;Patient/family education;Passive range of motion;Visual/perceptual remediation/compensation;Joint Manipulations    PT Next Visit Plan 6 MWT, work on sit to stand without HHA, coming down stairs with rail    PT Home Exercise Plan Walking program: walk 20 min with cane 3x/week;Access Code: [email or web address]    Consulted and Agree with Plan of Care Patient           Patient will benefit from skilled therapeutic intervention in order to improve the following deficits and impairments:  Abnormal gait, Decreased balance, Decreased activity tolerance, Decreased endurance, Decreased coordination, Decreased mobility, Decreased range of motion, Decreased strength, Difficulty walking, Postural dysfunction, Pain  Visit Diagnosis: Ataxia  Muscle weakness (generalized)  Pain in left ankle and joints of left foot  Unsteadiness on feet  TIA (transient ischemic attack)     Problem List Patient Active Problem List   Diagnosis Date Noted  . TIA (transient ischemic attack) 11/05/2019  . Essential hypertension 11/05/2019  . 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 C6 radiculopathy 05/04/2019  . Pain due to onychomycosis of toenails of both feet 04/28/2019  . Worsened handwriting 03/16/2019  . Gait disturbance 03/16/2019  . Slurred speech 03/16/2019  . Memory  loss 03/16/2019  . Pain in left ankle and joints of left foot 08/06/2018  . 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  . Foot pain, 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  . Dysesthesia 06/04/2016  . 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 History of cervical spinal surgery 06/04/2016    Kerrie Pleasure 01/03/2020, 10:15 AM  [facility] [address] [place], [place], [address] Phone: [phone]   Fax:  [phone]  Name: ADEKUNLE ROHRBACH MRN: [id number] Date of Birth: [birth year]

## 2020-01-07 ENCOUNTER — Other Ambulatory Visit: Payer: Self-pay

## 2020-01-07 ENCOUNTER — Ambulatory Visit: Payer: Medicare Other

## 2020-01-07 ENCOUNTER — Encounter: Payer: Self-pay | Admitting: Internal Medicine

## 2020-01-07 ENCOUNTER — Ambulatory Visit (INDEPENDENT_AMBULATORY_CARE_PROVIDER_SITE_OTHER): Payer: Medicare Other | Admitting: Internal Medicine

## 2020-01-07 VITALS — BP 120/60 | HR 68 | Ht 70.0 in | Wt 180.2 lb

## 2020-01-07 DIAGNOSIS — R1312 Dysphagia, oropharyngeal phase: Secondary | ICD-10-CM | POA: Diagnosis not present

## 2020-01-07 DIAGNOSIS — G459 Transient cerebral ischemic attack, unspecified: Secondary | ICD-10-CM

## 2020-01-07 DIAGNOSIS — R270 Ataxia, unspecified: Secondary | ICD-10-CM | POA: Diagnosis not present

## 2020-01-07 DIAGNOSIS — M6281 Muscle weakness (generalized): Secondary | ICD-10-CM | POA: Diagnosis not present

## 2020-01-07 DIAGNOSIS — M25572 Pain in left ankle and joints of left foot: Secondary | ICD-10-CM

## 2020-01-07 DIAGNOSIS — R2681 Unsteadiness on feet: Secondary | ICD-10-CM

## 2020-01-07 LAB — LIPID PANEL
Chol/HDL Ratio: 2.6 ratio (ref 0.0–5.0)
Cholesterol, Total: 128 mg/dL (ref 100–199)
HDL: 50 mg/dL (ref >39)
LDL Chol Calc (NIH): 58 mg/dL (ref 0–99)
Triglycerides: 107 mg/dL (ref 0–149)
VLDL Cholesterol Cal: 20 mg/dL (ref 5–40)

## 2020-01-07 NOTE — Progress Notes (Signed)
Cardiology Office Note   Date:  01/07/2020   ID:  [name], Saab April 01, 1941, MRN [id number]  PCP:  Haywood Pao, MD  Cardiologist:   Dorris Carnes, MD    Pt referred for cardiac eval of CVA  History of Present Illness: Derrick White is a 79 y.o. male with a history of hyperlipidemia, hypertension, CV disease.  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   MRA of head and neck normal   Echo showed LVEF 60 to 65%  Lipids :  LDL 82   Pt started on ASA and Plavix for 1 month then ASA   To be set up for cardiac event monitor  Since admission the pt has improved   He is undergoing therapy     Breathing is OK   He deneis palpitations    He is very active.     Current Meds  Medication Sig  . acetaminophen (TYLENOL) 500 MG tablet Take 500 mg by mouth every 6 (six) hours as needed for moderate pain or headache.  Marland Kitchen aspirin EC 81 MG EC tablet Take 1 tablet (81 mg total) by mouth daily. Swallow whole.  Marland Kitchen atorvastatin (LIPITOR) 40 MG tablet Take 1 tablet (40 mg total) by mouth daily.  . Cholecalciferol (VITAMIN D-1000 MAX ST) 1000 units tablet Take 1,000 Units by mouth daily.   . cyanocobalamin 1000 MCG tablet Take 1,000 mcg by mouth daily.   Marland Kitchen ibuprofen (ADVIL) 200 MG tablet Take 200 mg by mouth every 6 (six) hours as needed for headache or moderate pain.  . NON FORMULARY Triflex medication for joints  . NON FORMULARY Legatrin medication OTC Restless leg  . pantoprazole (PROTONIX) 40 MG tablet Take 40 mg by mouth daily.   . Probiotic Product (ALIGN PO) Take by mouth as directed.  Marland Kitchen Propylene Glycol (SYSTANE BALANCE) 0.6 % SOLN Place 1 drop into both eyes daily as needed (burning).  Marland Kitchen telmisartan (MICARDIS) 80 MG tablet Take 80 mg by mouth daily.      Allergies:   Morphine, Codeine, Propoxyphene, and Shrimp [shellfish allergy]   Past Medical History:  Diagnosis Date  . C6 radiculopathy 05/04/2019  .  Cerebellar ataxia ([place])   . Dysesthesia 06/04/2016  . Essential hypertension 11/05/2019  . Foot pain, 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  . Gait disturbance 03/16/2019  . History of cervical spinal surgery 06/04/2016  . History of hiatal hernia   . History of kidney stones   . Hypertension   . 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 Memory loss 03/16/2019  . 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  . Pain due to onychomycosis of toenails of both feet 04/28/2019  . Pain in left ankle and joints of left foot 08/06/2018  . Slurred speech 03/16/2019  . 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 TIA (transient ischemic attack) 11/05/2019  . Worsened handwriting 03/16/2019    Past Surgical History:  Procedure Laterality Date  . APPENDECTOMY     1940s  . [place]  . FRACTURE SURGERY     Left ankle, sx x4  . INGUINAL HERNIA REPAIR Right 03/05/2019   Procedure: OPEN RIGHT INGUINAL HERNIA REPAIR WITH MESH;  Surgeon: Clovis Riley, MD;  Location: [facility];  Service: General;  Laterality: Right;  . KNEE SURGERY Left 1985  . SHOULDER SURGERY Left   . TONSILLECTOMY  1945     Social History:  The patient  reports that he quit smoking about 26 years ago. His smoking use included cigarettes. He has a 30.00 pack-year smoking history. He has never used smokeless tobacco. He reports previous alcohol use of about 14.0 standard drinks of alcohol per week. He reports that he does not use drugs.   Family History:  The patient's family history is not on file.    ROS:  Please see the history of present illness. All other systems are reviewed and  Negative to the above problem except as noted.    PHYSICAL EXAM: VS:  BP 120/60   Pulse 68   Ht 5\' 10"  (1.778 m)   Wt 180 lb 3.2 oz (81.7 kg)   SpO2 97%   BMI 25.86 kg/m   GEN: Well nourished, well developed, in no acute distress  HEENT: normal  Neck: no JVD, no carotid bruits Cardiac: RRR; no murmurs  ,no LE edema  Respiratory:   clear to auscultation bilaterally, normal work of breathing GI: soft, nontender, nondistended, + BS  No hepatomegaly  MS: no deformity Moving all extremities   Skin: warm and dry, no rash Neuro:  Strength and sensation are intact  Otherwise derferred Psych: euthymic mood, full affect   EKG:  EKG is ordered today.  Echo :   June 2021  1. Left ventricular ejection fraction, by estimation, is 60 to 65%. The left ventricle has normal function. The left ventricle has no regional wall motion abnormalities. Left ventricular diastolic parameters were normal. 2. Right ventricular systolic function is normal. The right ventricular size is normal. There is normal pulmonary artery systolic pressure. 3. The mitral valve is normal in structure. No evidence of mitral valve regurgitation. No evidence of mitral stenosis. 4. The aortic valve is normal in structure. Aortic valve regurgitation is not visualized. No aortic stenosis is present. 5. Aortic dilatation noted. There is borderline dilatation of the aortic root. 6. The inferior vena cava is normal in size with greater than 50% respiratory variability, suggesting right atrial pressure of 3 mmHg.  Lipid Panel    Component Value Date/Time   CHOL 165 11/05/2019 0959   TRIG 161 (H) 11/05/2019 0959   HDL 51 11/05/2019 0959   CHOLHDL 3.2 11/05/2019 0959   VLDL 32 11/05/2019 0959   LDLCALC 82 11/05/2019 0959      Wt Readings from Last 3 Encounters:  01/07/20 180 lb 3.2 oz (81.7 kg)  11/05/19 180 lb (81.6 kg)  03/16/19 184 lb 12.8 oz (83.8 kg)      ASSESSMENT AND PLAN:  1  CVA   Recent L temporal infarct   Recovering  Will set up for an event monitor    Patient remains on aspirin 81 mg for now WIll contact neuro for any further testing   2  HL  Check lipids on current dose of statin  3  HTN  BP is well controlled        Current medicines are reviewed at length with the patient today.  The patient does not have concerns  regarding medicines.  Signed, Dorris Carnes, MD  01/07/2020 11:02 AM    [facility] [address], [place], [place]  [address] Phone: [phone]; Fax: [phone]

## 2020-01-07 NOTE — Patient Instructions (Signed)
Medication Instructions:  No changes *If you need a refill on your cardiac medications before your next appointment, please call your pharmacy*   Lab Work: Today: lipids If you have labs (blood work) drawn today and your tests are completely normal, you will receive your results only by: Marland Kitchen MyChart Message (if you have MyChart) OR . A paper copy in the mail If you have any lab test that is abnormal or we need to change your treatment, we will call you to review the results.   Testing/Procedures: none   Follow-Up: Follow up with your physician will depend on test results.  Other Instructions

## 2020-01-07 NOTE — Therapy (Signed)
[facility] [address] [place], [place], [address] Phone: [phone]   Fax:  [phone]  Physical Therapy Treatment  Patient Details  Name: Derrick White MRN: [id number] Date of Birth: 1940-07-26 Referring Provider (PT): Dr. Domenick Gong   Encounter Date: 01/07/2020   PT End of Session - 01/07/20 1357    Visit Number 9    Number of Visits 13    Date for PT Re-Evaluation 01/10/20    Authorization Type Medicare    Progress Note Due on Visit 10    PT Start Time 1325    PT Stop Time 1400    PT Time Calculation (min) 35 min    Equipment Utilized During Treatment Gait belt    Activity Tolerance Patient tolerated treatment well    Behavior During Therapy [facility] for tasks assessed/performed           Past Medical History:  Diagnosis Date  . C6 radiculopathy 05/04/2019  . Cerebellar ataxia ([place])   . Dysesthesia 06/04/2016  . Essential hypertension 11/05/2019  . Foot pain, 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  . Gait disturbance 03/16/2019  . History of cervical spinal surgery 06/04/2016  . History of hiatal hernia   . History of kidney stones   . Hypertension   . 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 Memory loss 03/16/2019  . 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  . Pain due to onychomycosis of toenails of both feet 04/28/2019  . Pain in left ankle and joints of left foot 08/06/2018  . Slurred speech 03/16/2019  . 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 TIA (transient ischemic attack) 11/05/2019  . Worsened handwriting 03/16/2019    Past Surgical History:  Procedure Laterality Date  . APPENDECTOMY     1940s  . [place]  . FRACTURE SURGERY     Left ankle, sx x4  . INGUINAL HERNIA REPAIR Right 03/05/2019   Procedure: OPEN RIGHT INGUINAL HERNIA REPAIR WITH MESH;  Surgeon: Clovis Riley, MD;  Location: [facility];  Service: General;  Laterality: Right;  . KNEE SURGERY Left 1985  .  SHOULDER SURGERY Left   . TONSILLECTOMY     1945    There were no vitals filed for this visit.   Subjective Assessment - 01/07/20 1355    Subjective Pain is about 3/10    Pertinent History HTN, cerebellar ataxia, oropharyngeal dysphagia, Sialorrhea, Chronic R ankle pain    Limitations Walking;Other (comment)   stairs   How long can you sit comfortably? no issues    How long can you stand comfortably? 10 min    How long can you walk comfortably? 200 yards    Diagnostic tests 11/05/19: IMPRESSION:1. 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 Moderate chronic small vessel ischemic disease.3. Motion degraded head MRA without evidence of a large or mediumvessel occlusion or significant proximal stenosis.4. Negative neck MRA.  IMPRESSION:1. 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 Mild spinal stenosis and mild-to-moderate neural foraminalstenosis at C6-7, stable to minimally progressed.    Patient Stated Goals walk 1 mile comfortably, come down stairs better    Pain Onset More than a month ago                  Manual therapy Myofascial release to anterior tib border, Lateral malleolus, interosseous space, scar tissue in foot and ankle Grade IV mid foot mobilization  to improve inversion   Therex Manually resisted toes flexion and extension: 20x L only Supine hooklying ankle dorsiflexion and plantarflexion with toe flexion and toe extension: 20x, manual resistance L sidelying ankle inversion: 20x Supine hooklying left ankle inversion and eversion, manual resistance 1/2 foam roll heel raises: 2 x 10 Standing ankle dorsiflexion: 20x Standing anterior lean in staggerred stance with annual resistance: 1' R and L, cues to shift weight appropriately                                       PT Short Term Goals - 12/24/19 1051      PT SHORT TERM  GOAL #1   Title Patient will be able to perform sit to stand without HHA without pushing knees against chair and with one trial only    Baseline pushes back of knee agianst chair, takes several tries; able to perform sit to stand without arm support (12/24/19)    Time 3    Period Weeks    Status Achieved    Target Date 12/20/19      PT SHORT TERM GOAL #2   Title Patient will be able to ambulate 25 min with straight cane for 3x/week to improve compliance with walking program    Baseline 200 yards, able to walk 3/4 to 1 mile with one walking stick (12/24/19)    Time 3    Period Weeks    Status Achieved    Target Date 12/20/19      PT SHORT TERM GOAL #3   Title Patient will be able to come down stairs with reciprocal gait pattern and use of one rail to improve ability to come down stairs without significant ankle pain    Baseline step to pattern, increased L aknle pain with reciprocal gait pattern    Time 3    Period Weeks    Status New    Target Date 12/20/19             PT Long Term Goals - 11/29/19 1218      PT LONG TERM GOAL #1   Title Patient will demo <12 seconds on normal TUG to improve functional mobility and transfers    Baseline 16 seconds (normal); 20 seconds with carrying 15lbs in R hand; 19 sec with carrying 15lbs in L Hand    Time 6    Period Weeks    Status New    Target Date 01/10/20      PT LONG TERM GOAL #2   Title Patient will be able to ambulate for 40 min for 2x/week to improve walking endurance    Baseline 200 yards    Time 6    Period Weeks    Status New    Target Date 12/20/19                 Plan - 01/07/20 1356    Clinical Impression Statement Patient has reached a plateau with pain leves in his ankle with walking where it has been 3/10 in past 2 weeks. today we worked on improving inversion and plantarflexion of left foot and continued strengthening around the ankle. Pt reported no pain with walking at end of the session.    Personal  Factors and Comorbidities Age;Comorbidity 2;Past/Current Experience;Time since onset of injury/illness/exacerbation    Comorbidities HTN, cerebellar ataxia, oropharyngeal dysphagia, Sialorrhea, Chronic R ankle pain    Examination-Activity  Limitations Carry;Lift;Squat;Stairs;Stand;Transfers    Examination-Participation Restrictions Community Activity;Laundry;Shop;Yard Work    Stability/Clinical Decision Making Evolving/Moderate complexity    Rehab Potential Good    PT Treatment/Interventions ADLs/Self Care Home Management;Gait training;Stair training;Functional mobility training;Therapeutic exercise;Therapeutic activities;Balance training;Neuromuscular re-education;Manual techniques;Patient/family education;Passive range of motion;Visual/perceptual remediation/compensation;Joint Manipulations    PT Next Visit Plan 6 MWT, work on sit to stand without HHA, coming down stairs with rail    PT Home Exercise Plan Walking program: walk 20 min with cane 3x/week;Access Code: [email or web address]    Consulted and Agree with Plan of Care Patient           Patient will benefit from skilled therapeutic intervention in order to improve the following deficits and impairments:  Abnormal gait, Decreased balance, Decreased activity tolerance, Decreased endurance, Decreased coordination, Decreased mobility, Decreased range of motion, Decreased strength, Difficulty walking, Postural dysfunction, Pain  Visit Diagnosis: Ataxia  Muscle weakness (generalized)  Pain in left ankle and joints of left foot  Unsteadiness on feet  TIA (transient ischemic attack)     Problem List Patient Active Problem List   Diagnosis Date Noted  . TIA (transient ischemic attack) 11/05/2019  . Essential hypertension 11/05/2019  . Cerebellar ataxia ([place]) 11/05/2019  . 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 C6 radiculopathy 05/04/2019  . Pain due to onychomycosis of toenails of both feet 04/28/2019  . Worsened handwriting  03/16/2019  . Gait disturbance 03/16/2019  . Slurred speech 03/16/2019  . Memory loss 03/16/2019  . Pain in left ankle and joints of left foot 08/06/2018  . 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  . Foot pain, 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  . Dysesthesia 06/04/2016  . 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 History of cervical spinal surgery 06/04/2016    Kerrie Pleasure 01/07/2020, 2:04 PM  [facility] [address] [place], [place], [address] Phone: [phone]   Fax:  [phone]  Name: BRAYAN VOTAW MRN: [id number] Date of Birth: 07/28/1940

## 2020-01-10 ENCOUNTER — Telehealth: Payer: Self-pay | Admitting: *Deleted

## 2020-01-10 ENCOUNTER — Other Ambulatory Visit: Payer: Self-pay

## 2020-01-10 ENCOUNTER — Ambulatory Visit: Payer: Medicare Other

## 2020-01-10 DIAGNOSIS — R270 Ataxia, unspecified: Secondary | ICD-10-CM

## 2020-01-10 DIAGNOSIS — R1312 Dysphagia, oropharyngeal phase: Secondary | ICD-10-CM | POA: Diagnosis not present

## 2020-01-10 DIAGNOSIS — G459 Transient cerebral ischemic attack, unspecified: Secondary | ICD-10-CM | POA: Diagnosis not present

## 2020-01-10 DIAGNOSIS — M6281 Muscle weakness (generalized): Secondary | ICD-10-CM

## 2020-01-10 DIAGNOSIS — M25572 Pain in left ankle and joints of left foot: Secondary | ICD-10-CM

## 2020-01-10 DIAGNOSIS — R2681 Unsteadiness on feet: Secondary | ICD-10-CM | POA: Diagnosis not present

## 2020-01-10 NOTE — Telephone Encounter (Signed)
Spoke with patient and informed monitor will arrive in mail w instructions.  Confirmed address.  We have 2 types of monitors - holter (not live) which is up to 14 days MAX, and -event (live) up to 30 days.  Will clarify w Dr. Harrington Challenger which monitor and for how long she is ordering.

## 2020-01-10 NOTE — Telephone Encounter (Signed)
Monitor ordered.  His [facility] neurologist is Thenganatt, Audrea Muscat, MD.

## 2020-01-10 NOTE — Telephone Encounter (Signed)
-----   Message from Dorris Carnes V, MD sent at 01/09/2020  8:43 PM EDT ----- Pt needs to be set up for 3 wk event monitor  Does not need to be live I will review with neuro

## 2020-01-10 NOTE — Telephone Encounter (Signed)
Patient enrolled for [employer] to ship a 30 day cardiac event monitor to his home.  Instructions included in the monitor kit.

## 2020-01-10 NOTE — Telephone Encounter (Signed)
30 day live  Who is neurologist he will follow up with ?

## 2020-01-10 NOTE — Therapy (Signed)
[facility] [address] [place], [place], [address] Phone: [phone]   Fax:  [phone]  Physical Therapy Progress Note/Re-certification Note  Patient Details  Name: Derrick White MRN: [id number] Date of Birth: 1941/02/10 Referring Provider (PT): Dr. Domenick Gong   Encounter Date: 01/10/2020   PT End of Session - 01/10/20 1026    Visit Number 10    Number of Visits 13    Date for PT Re-Evaluation 01/10/20    Authorization Type Medicare    Progress Note Due on Visit 10    PT Start Time 1025    PT Stop Time 1105    PT Time Calculation (min) 40 min    Equipment Utilized During Treatment Gait belt    Activity Tolerance Patient tolerated treatment well    Behavior During Therapy [facility] for tasks assessed/performed           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   Cerebellar ataxia ([place])    Dysesthesia 06/04/2016   Essential hypertension 11/05/2019   Foot pain, 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   Gait disturbance 03/16/2019   History of cervical spinal surgery 06/04/2016   History of hiatal hernia    History of kidney stones    Hypertension    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   Memory loss 03/16/2019   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   Pain due to onychomycosis of toenails of both feet 04/28/2019   Pain in left ankle and joints of left foot 08/06/2018   Slurred speech 03/16/2019   Superficial peroneal nerve neuropathy, 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   Subjective Assessment - 01/10/20 1025    Subjective This weekend was the first time I had little to no pain and I had the best 2 days I have had in a while.    Pertinent History HTN, cerebellar ataxia, oropharyngeal dysphagia, Sialorrhea, Chronic R ankle pain    Limitations Walking;Other (comment)    How long can you sit comfortably? no issues    How long can you stand comfortably? 10 min    How long can you walk comfortably? 200 yards    Diagnostic tests 11/05/19: IMPRESSION:1. 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 Moderate chronic small vessel ischemic disease.3. Motion degraded head MRA without evidence of a large or mediumvessel occlusion or significant proximal stenosis.4. Negative neck MRA.  IMPRESSION:1. 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 Mild spinal stenosis and mild-to-moderate neural foraminalstenosis at C6-7, stable to minimally progressed.    Patient Stated Goals walk 1 mile comfortably, come down stairs better    Currently in Pain? Yes    Pain Score 2     Pain Location Ankle    Pain Orientation Left  Unilateral gastroc stretch: 1/2 foam roll: 3 x 30" R and L bil heel raise: 1/2 foam roll: 2 x 10 Uni heel raise: floor: 3 x 5 R and L Stairs: bil hand rails reciprocal gait pattern x 4 steps; rail on R reciprocal gait pattern x 4 steps- SBA  Pt educated on walking 40 min, 2x/week to increase his walking endurance. On other days he should continue with his 30 min walks.  Foam balance beam walking: tandem 5x fwd and bwd; 5x lateral each way   Grade IV eversion cacalneal tilt with foot in plantarflexion  Mid foot mobiization t o improve inversion  Myofascial release to medial and lateral borders of the shin  and distal achilles tendon                    PT Short Term Goals - 01/10/20 1028      PT SHORT TERM GOAL #1   Title Patient will be able to perform sit to stand without HHA without pushing knees against chair and with one trial only    Baseline pushes back of knee agianst chair, takes several tries; able to perform sit to stand without arm support (12/24/19)    Time 3    Period Weeks    Status Achieved    Target Date 12/20/19      PT SHORT TERM GOAL #2   Title Patient will be able to ambulate 25 min with straight cane for 3x/week to improve compliance with walking program    Baseline 200 yards, able to walk 3/4 to 1 mile with one walking stick (12/24/19)    Time 3    Period Weeks    Status Achieved    Target Date 12/20/19      PT SHORT TERM GOAL #3   Title Patient will be able to come down stairs with reciprocal gait pattern and use of one rail to improve ability to come down stairs without significant ankle pain    Baseline step to pattern, increased L aknle pain with reciprocal gait pattern    Time 3    Period Weeks    Status New    Target Date 12/20/19             PT Long Term Goals - 01/10/20 1029      PT LONG TERM GOAL #1   Title Patient will demo <12 seconds on normal TUG to improve functional mobility and transfers    Baseline 16 seconds (normal); 20 seconds with carrying 15lbs in R hand; 19 sec with carrying 15lbs in L Hand    Time 6    Period Weeks    Status New      PT LONG TERM GOAL #2   Title Patient will be able to ambulate for 40 min for 2x/week to improve walking endurance    Baseline 200 yards    Time 6    Period Weeks    Status New                  Patient will benefit from skilled therapeutic intervention in order to improve the following deficits and impairments:     Visit Diagnosis: Muscle weakness (generalized)  Ataxia  Pain in left ankle and joints of left foot  Unsteadiness on feet     Problem List Patient  Active Problem List   Diagnosis Date Noted   TIA (transient ischemic attack) 11/05/2019   Essential hypertension 11/05/2019   Cerebellar ataxia ([place])  11/05/2019   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 [place], [place], [address] Phone: [phone]   Fax:  [phone]  Name: Derrick White MRN: [id number] Date of Birth: 1940-10-26

## 2020-01-13 ENCOUNTER — Ambulatory Visit: Payer: Medicare Other

## 2020-01-13 ENCOUNTER — Other Ambulatory Visit: Payer: Self-pay

## 2020-01-13 DIAGNOSIS — M25572 Pain in left ankle and joints of left foot: Secondary | ICD-10-CM

## 2020-01-13 DIAGNOSIS — R270 Ataxia, unspecified: Secondary | ICD-10-CM | POA: Diagnosis not present

## 2020-01-13 DIAGNOSIS — R2681 Unsteadiness on feet: Secondary | ICD-10-CM

## 2020-01-13 DIAGNOSIS — M6281 Muscle weakness (generalized): Secondary | ICD-10-CM | POA: Diagnosis not present

## 2020-01-13 DIAGNOSIS — G459 Transient cerebral ischemic attack, unspecified: Secondary | ICD-10-CM | POA: Diagnosis not present

## 2020-01-13 DIAGNOSIS — R1312 Dysphagia, oropharyngeal phase: Secondary | ICD-10-CM | POA: Diagnosis not present

## 2020-01-13 NOTE — Therapy (Signed)
[facility] [address] [place], [place], [address] Phone: [phone]   Fax:  [phone]  Physical Therapy Treatment  Patient Details  Name: Derrick White MRN: [id number] Date of Birth: [birth year] Referring Provider (PT): Dr. Domenick Gong   Encounter Date: 01/13/2020   PT End of Session - 01/13/20 1433    Visit Number 11    Number of Visits 16    Date for PT Re-Evaluation 03/06/20    Authorization Type Medicare, PN/Re-cert 7/42/59    Authorization - Number of Visits 11    Progress Note Due on Visit 16    PT Start Time 1400    PT Stop Time 1445    PT Time Calculation (min) 45 min    Equipment Utilized During Treatment Gait belt    Activity Tolerance Patient tolerated treatment well    Behavior During Therapy [facility] for tasks assessed/performed           Past Medical History:  Diagnosis Date  . C6 radiculopathy 05/04/2019  . Cerebellar ataxia ([place])   . Dysesthesia 06/04/2016  . Essential hypertension 11/05/2019  . Foot pain, 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  . Gait disturbance 03/16/2019  . History of cervical spinal surgery 06/04/2016  . History of hiatal hernia   . History of kidney stones   . Hypertension   . 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 Memory loss 03/16/2019  . 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  . Pain due to onychomycosis of toenails of both feet 04/28/2019  . Pain in left ankle and joints of left foot 08/06/2018  . Slurred speech 03/16/2019  . 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 TIA (transient ischemic attack) 11/05/2019  . Worsened handwriting 03/16/2019    Past Surgical History:  Procedure Laterality Date  . APPENDECTOMY     1940s  . [place]  . FRACTURE SURGERY     Left ankle, sx x4  . INGUINAL HERNIA REPAIR Right 03/05/2019   Procedure: OPEN RIGHT INGUINAL HERNIA REPAIR WITH MESH;  Surgeon: Clovis Riley, MD;  Location: [facility];  Service:  General;  Laterality: Right;  . KNEE SURGERY Left 1985  . SHOULDER SURGERY Left   . TONSILLECTOMY     1945    There were no vitals filed for this visit.   Subjective Assessment - 01/13/20 1430    Subjective Pt reports pain L ankle is about 2/10. I didn't do my stretches this morning. I did my 2 walks for 40 min with some left ankle pain.    Pertinent History HTN, cerebellar ataxia, oropharyngeal dysphagia, Sialorrhea, Chronic R ankle pain    Limitations Walking;Other (comment)    How long can you sit comfortably? no issues    How long can you stand comfortably? 10 min    How long can you walk comfortably? 200 yards    Diagnostic tests 11/05/19: IMPRESSION:1. 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 Moderate chronic small vessel ischemic disease.3. Motion degraded head MRA without evidence of a large or mediumvessel occlusion or significant proximal stenosis.4. Negative neck MRA.  IMPRESSION:1. 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 Mild spinal stenosis and mild-to-moderate neural foraminalstenosis at C6-7, stable to minimally progressed.    Patient Stated Goals walk 1 mile comfortably, come down stairs better  Unilateral gastroc stretch: 1/2 foam roll: 3 x 30" R and L Uni heel raise: floor: 3 x 8 R and L Foam balance beam: tandem standing with stepping fwd and bwd with contralateral leg: 2 x 10 R and L Static tandem stance on foam beam with head turns: 2 x 10 R and L Standing narrow BOS on foam: EC: 2'  Myofascial release to clear lateral calcaneal border, anterior tibial border, interphalangeal space between 3rd and 4th toe 3rd and 4th toe distraction with some pain: grade III                        PT Short Term Goals - 01/10/20 1028      PT SHORT TERM GOAL #1   Title Patient will be able to perform sit to stand without  HHA without pushing knees against chair and with one trial only    Baseline pushes back of knee agianst chair, takes several tries; able to perform sit to stand without arm support (12/24/19)    Time 3    Period Weeks    Status Achieved    Target Date 12/20/19      PT SHORT TERM GOAL #2   Title Patient will be able to ambulate 25 min with straight cane for 3x/week to improve compliance with walking program    Baseline 200 yards, able to walk 3/4 to 1 mile with one walking stick (12/24/19)    Time 3    Period Weeks    Status Achieved    Target Date 12/20/19      PT SHORT TERM GOAL #3   Title Patient will be able to come down stairs with reciprocal gait pattern and use of one rail to improve ability to come down stairs without significant ankle pain    Baseline step to pattern, increased L aknle pain with reciprocal gait pattern    Time 3    Period Weeks    Status New    Target Date 12/20/19             PT Long Term Goals - 01/10/20 1029      PT LONG TERM GOAL #1   Title Patient will demo <12 seconds on normal TUG to improve functional mobility and transfers    Baseline 16 seconds (normal); 20 seconds with carrying 15lbs in R hand; 19 sec with carrying 15lbs in L Hand; 13 seconds normal TUG (01/10/20), 14 seconds with carrying 15lbs in R hand (01/10/20)    Time 4    Period Weeks    Status On-going    Target Date 02/07/20      PT LONG TERM GOAL #2   Title Patient will be able to ambulate for 40 min for 2x/week to improve walking endurance    Baseline 200 yards; able to walk 30 min daily (01/10/20)    Time 4    Period Weeks    Status On-going    Target Date 02/07/20                 Plan - 01/13/20 1432    Clinical Impression Statement Pt tolerate session well. Pt is gradually reporting improving pain levels and balance with decreasing L aknle pain    Personal Factors and Comorbidities Age;Comorbidity 2;Past/Current Experience;Time since onset of  injury/illness/exacerbation    Comorbidities HTN, cerebellar ataxia, oropharyngeal dysphagia, Sialorrhea, Chronic R ankle pain    Examination-Activity Limitations Carry;Lift;Squat;Stairs;Stand;Transfers    Examination-Participation Restrictions Community Activity;Laundry;Shop;Yard  Work    Stability/Clinical Decision Making Evolving/Moderate complexity    Rehab Potential Good    PT Frequency 2x / week    PT Duration 4 weeks    PT Treatment/Interventions ADLs/Self Care Home Management;Gait training;Stair training;Functional mobility training;Therapeutic exercise;Therapeutic activities;Balance training;Neuromuscular re-education;Manual techniques;Patient/family education;Passive range of motion;Visual/perceptual remediation/compensation;Joint Manipulations    PT Home Exercise Plan Walking program: walk 20 min with cane 3x/week;Access Code: [email or web address]    Consulted and Agree with Plan of Care Patient           Patient will benefit from skilled therapeutic intervention in order to improve the following deficits and impairments:  Abnormal gait, Decreased balance, Decreased activity tolerance, Decreased endurance, Decreased coordination, Decreased mobility, Decreased range of motion, Decreased strength, Difficulty walking, Postural dysfunction, Pain  Visit Diagnosis: Muscle weakness (generalized)  Ataxia  Pain in left ankle and joints of left foot  Unsteadiness on feet     Problem List Patient Active Problem List   Diagnosis Date Noted  . TIA (transient ischemic attack) 11/05/2019  . Essential hypertension 11/05/2019  . 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 C6 radiculopathy 05/04/2019  . Pain due to onychomycosis of toenails of both feet 04/28/2019  . Worsened handwriting 03/16/2019  . Gait disturbance 03/16/2019  . Slurred speech 03/16/2019  . Memory loss 03/16/2019  . Pain in left ankle and joints of left foot 08/06/2018  . Mild  cognitive impairment 12/12/2017  . Foot pain, 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  . Dysesthesia 06/04/2016  . 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 History of cervical spinal surgery 06/04/2016    Kerrie Pleasure 01/13/2020, 2:45 PM  [facility] [address] [place], [place], [address] Phone: [phone]   Fax:  [phone]  Name: BRANNON LEVENE MRN: [id number] Date of Birth: 1940/12/15

## 2020-01-15 ENCOUNTER — Ambulatory Visit (INDEPENDENT_AMBULATORY_CARE_PROVIDER_SITE_OTHER): Payer: Medicare Other

## 2020-01-15 ENCOUNTER — Telehealth: Payer: Self-pay | Admitting: Internal Medicine

## 2020-01-15 DIAGNOSIS — I4891 Unspecified atrial fibrillation: Secondary | ICD-10-CM

## 2020-01-15 DIAGNOSIS — G459 Transient cerebral ischemic attack, unspecified: Secondary | ICD-10-CM

## 2020-01-17 ENCOUNTER — Ambulatory Visit: Payer: Medicare Other

## 2020-01-17 ENCOUNTER — Other Ambulatory Visit: Payer: Self-pay

## 2020-01-17 ENCOUNTER — Other Ambulatory Visit: Payer: Self-pay | Admitting: Internal Medicine

## 2020-01-17 ENCOUNTER — Encounter: Payer: Medicare Other | Admitting: Speech Pathology

## 2020-01-17 ENCOUNTER — Telehealth: Payer: Self-pay | Admitting: Internal Medicine

## 2020-01-17 DIAGNOSIS — R2681 Unsteadiness on feet: Secondary | ICD-10-CM | POA: Diagnosis not present

## 2020-01-17 DIAGNOSIS — G459 Transient cerebral ischemic attack, unspecified: Secondary | ICD-10-CM | POA: Diagnosis not present

## 2020-01-17 DIAGNOSIS — I4891 Unspecified atrial fibrillation: Secondary | ICD-10-CM

## 2020-01-17 DIAGNOSIS — M6281 Muscle weakness (generalized): Secondary | ICD-10-CM

## 2020-01-17 DIAGNOSIS — M25572 Pain in left ankle and joints of left foot: Secondary | ICD-10-CM

## 2020-01-17 DIAGNOSIS — R1312 Dysphagia, oropharyngeal phase: Secondary | ICD-10-CM | POA: Diagnosis not present

## 2020-01-17 DIAGNOSIS — R270 Ataxia, unspecified: Secondary | ICD-10-CM | POA: Diagnosis not present

## 2020-01-17 MED ORDER — APIXABAN 5 MG PO TABS: 5.0000 mg | ORAL_TABLET | Freq: Two times a day (BID) | ORAL | 5 refills | Status: DC

## 2020-01-17 NOTE — Addendum Note (Signed)
Addended by: Rodman Key on: 01/17/2020 09:30 AM   Modules accepted: Orders

## 2020-01-17 NOTE — Telephone Encounter (Signed)
[facility]liqu[facility] to [facility].

## 2020-01-17 NOTE — Telephone Encounter (Signed)
Pt wearing monitor      Shows atiral fibrillation Wil call in Rx for Eliquis 5 bid  Spoke with wife   Will review at appt

## 2020-01-18 NOTE — Telephone Encounter (Signed)
Eliquis prescribed    I contacted pt to review   He told me Eliquis will be $300/month I mentioned Xarelto  He will check on pricing I also mentioned coumadin  Not sure if he would qualify for any program.

## 2020-01-18 NOTE — Therapy (Signed)
[facility] [address] [place], [place], [address] Phone: [phone]   Fax:  [phone]  Physical Therapy Treatment  Patient Details  Name: Derrick White MRN: [id number] Date of Birth: 1940/11/18 Referring Provider (PT): Dr. Domenick Gong   Encounter Date: 01/17/2020   PT End of Session - 01/18/20 0929    Visit Number 12    Number of Visits 16    Date for PT Re-Evaluation 03/06/20    Authorization Type Medicare, PN/Re-cert 0/25/42    Authorization - Number of Visits 12    Progress Note Due on Visit 16    PT Start Time 7062    PT Stop Time 1100    PT Time Calculation (min) 45 min    Equipment Utilized During Treatment Gait belt    Activity Tolerance Patient tolerated treatment well    Behavior During Therapy [facility] for tasks assessed/performed           Past Medical History:  Diagnosis Date  . C6 radiculopathy 05/04/2019  . Cerebellar ataxia ([place])   . Dysesthesia 06/04/2016  . Essential hypertension 11/05/2019  . Foot pain, 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  . Gait disturbance 03/16/2019  . History of cervical spinal surgery 06/04/2016  . History of hiatal hernia   . History of kidney stones   . Hypertension   . 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 Memory loss 03/16/2019  . 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  . Pain due to onychomycosis of toenails of both feet 04/28/2019  . Pain in left ankle and joints of left foot 08/06/2018  . Slurred speech 03/16/2019  . 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 TIA (transient ischemic attack) 11/05/2019  . Worsened handwriting 03/16/2019    Past Surgical History:  Procedure Laterality Date  . APPENDECTOMY     1940s  . [place]  . FRACTURE SURGERY     Left ankle, sx x4  . INGUINAL HERNIA REPAIR Right 03/05/2019   Procedure: OPEN RIGHT INGUINAL HERNIA REPAIR WITH MESH;  Surgeon: Clovis Riley, MD;  Location: [facility];  Service:  General;  Laterality: Right;  . KNEE SURGERY Left 1985  . SHOULDER SURGERY Left   . TONSILLECTOMY     1945    There were no vitals filed for this visit.   Subjective Assessment - 01/17/20 1023    Subjective Pt reports pain L ankle is about 2/10. I didn't do my stretches this morning. I did my 2 walks for 40 min with some left ankle pain.    Pertinent History HTN, cerebellar ataxia, oropharyngeal dysphagia, Sialorrhea, Chronic R ankle pain    Limitations Walking;Other (comment)    How long can you sit comfortably? no issues    How long can you stand comfortably? 10 min    How long can you walk comfortably? 200 yards    Diagnostic tests 11/05/19: IMPRESSION:1. 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 Moderate chronic small vessel ischemic disease.3. Motion degraded head MRA without evidence of a large or mediumvessel occlusion or significant proximal stenosis.4. Negative neck MRA.  IMPRESSION:1. 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 Mild spinal stenosis and mild-to-moderate neural foraminalstenosis at C6-7, stable to minimally progressed.    Patient Stated Goals walk 1 mile comfortably, come down stairs better             Manual therapy: Myofascial release to  intertarsal space in toes 3-4 and 4-5. Grade II traction mobilization to 4th MTP and IP joints Grade III MTP mobilization to improve flexion and extension Myofascial release to anterior tib distal, peroneals, achilles tendon  Pt recommended to try OTC orthotics. Patient reported that he purchased a pair while ago. Patient educated to find his old shoe inserts and bring them in next session to be evaluated.                          PT Short Term Goals - 01/17/20 1024      PT SHORT TERM GOAL #1   Title Patient will be able to perform sit to stand without HHA without pushing knees against  chair and with one trial only    Baseline pushes back of knee agianst chair, takes several tries; able to perform sit to stand without arm support (12/24/19)    Time 3    Period Weeks    Status Achieved    Target Date 12/20/19      PT SHORT TERM GOAL #2   Title Patient will be able to ambulate 25 min with straight cane for 3x/week to improve compliance with walking program    Baseline 200 yards, able to walk 3/4 to 1 mile with one walking stick (12/24/19)    Time 3    Period Weeks    Status Achieved    Target Date 12/20/19      PT SHORT TERM GOAL #3   Title Patient will be able to come down stairs with reciprocal gait pattern and use of one rail to improve ability to come down stairs without significant ankle pain    Baseline step to pattern, increased L aknle pain with reciprocal gait pattern    Time 3    Period Weeks    Status Achieved    Target Date 12/20/19             PT Long Term Goals - 01/10/20 1029      PT LONG TERM GOAL #1   Title Patient will demo <12 seconds on normal TUG to improve functional mobility and transfers    Baseline 16 seconds (normal); 20 seconds with carrying 15lbs in R hand; 19 sec with carrying 15lbs in L Hand; 13 seconds normal TUG (01/10/20), 14 seconds with carrying 15lbs in R hand (01/10/20)    Time 4    Period Weeks    Status On-going    Target Date 02/07/20      PT LONG TERM GOAL #2   Title Patient will be able to ambulate for 40 min for 2x/week to improve walking endurance    Baseline 200 yards; able to walk 30 min White (01/10/20)    Time 4    Period Weeks    Status On-going    Target Date 02/07/20                 Plan - 01/18/20 0927    Clinical Impression Statement Pt is reporting pain levesl of 1-2/10 and is able to walk for 55 min 1x/week. Patient continues to report pain with traction of 4th metatarsal head and at the base of MTP joint on plantar side. Patient may benefit from trial OTC orthotics to improve support on bottom of  the foot.    Personal Factors and Comorbidities Age;Comorbidity 2;Past/Current Experience;Time since onset of injury/illness/exacerbation    Comorbidities HTN, cerebellar ataxia, oropharyngeal dysphagia, Sialorrhea, Chronic R ankle pain  Examination-Activity Limitations Carry;Lift;Squat;Stairs;Stand;Transfers    Examination-Participation Restrictions Community Activity;Laundry;Shop;Yard Work    [employer] Evolving/Moderate complexity    Rehab Potential Good    PT Frequency 2x / week    PT Duration 4 weeks    PT Treatment/Interventions ADLs/Self Care Home Management;Gait training;Stair training;Functional mobility training;Therapeutic exercise;Therapeutic activities;Balance training;Neuromuscular re-education;Manual techniques;Patient/family education;Passive range of motion;Visual/perceptual remediation/compensation;Joint Manipulations    PT Home Exercise Plan Walking program: walk 20 min with cane 3x/week;Access Code: [email or web address]    Consulted and Agree with Plan of Care Patient           Patient will benefit from skilled therapeutic intervention in order to improve the following deficits and impairments:  Abnormal gait, Decreased balance, Decreased activity tolerance, Decreased endurance, Decreased coordination, Decreased mobility, Decreased range of motion, Decreased strength, Difficulty walking, Postural dysfunction, Pain  Visit Diagnosis: Muscle weakness (generalized)  Ataxia  Pain in left ankle and joints of left foot  Unsteadiness on feet     Problem List Patient Active Problem List   Diagnosis Date Noted  . TIA (transient ischemic attack) 11/05/2019  . Essential hypertension 11/05/2019  . Cerebellar ataxia ([place]) 11/05/2019  . 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 C6 radiculopathy 05/04/2019  . Pain due to onychomycosis of toenails of both feet 04/28/2019  . Worsened handwriting 03/16/2019  . Gait disturbance 03/16/2019  . Slurred speech  03/16/2019  . Memory loss 03/16/2019  . Pain in left ankle and joints of left foot 08/06/2018  . 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  . Foot pain, 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  . Dysesthesia 06/04/2016  . 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 History of cervical spinal surgery 06/04/2016    Kerrie Pleasure 01/18/2020, 9:29 AM  [facility] [address] [place], [place], [address] Phone: [phone]   Fax:  [phone]  Name: Derrick White MRN: [id number] Date of Birth: 1940/10/24

## 2020-01-19 NOTE — Telephone Encounter (Signed)
**Note De-Identified  Obfuscation** [facility] called [facility] and was advised by pharmacist Jinny Blossom that the pts current cost for Eliquis is $320/30 day supply and that he must owe a deductible as the retail cost of Eliquis is over $1500.  I s/w [name] the pts wife and DPR at length concerning the pts anticoagulation options as follows:   1. If the pt owes a deductible his cost for Eliquis will come down to about $45/30 day supply once the deductible has been met if he can afford his deductible. I advised her to call the pts ins. plan or look over his policy to see if a deductible is owed and if so to decide if this is affordable for him.  2. Xarelto is also a name brand medication and the pts ins plan will charge him the same amount as Eliquis because both are tier 3 drugs except there is a program Dealer) that makes Xarelto a little more affordable as they will charge him $85/30 day supply if he is interested in applying with them.  3. The only generic anticoagulant on the market currently is Warfarin which is the generic to Coumadin. [name] states that she is familiar with Warfarin as her Dad took it. She states they want to avoid the pt taking that if at all possible.  [name] states that she is going to check on the pts ins plan and if they decide that the pt wants to switch to Xarelto or Warfarin they will call us back.  She thanked me for calling and explaining all of this to her.

## 2020-01-20 ENCOUNTER — Ambulatory Visit: Payer: Medicare Other | Attending: Internal Medicine

## 2020-01-20 ENCOUNTER — Telehealth: Payer: Self-pay

## 2020-01-20 ENCOUNTER — Other Ambulatory Visit: Payer: Self-pay

## 2020-01-20 DIAGNOSIS — R270 Ataxia, unspecified: Secondary | ICD-10-CM

## 2020-01-20 DIAGNOSIS — M25572 Pain in left ankle and joints of left foot: Secondary | ICD-10-CM | POA: Diagnosis not present

## 2020-01-20 DIAGNOSIS — G459 Transient cerebral ischemic attack, unspecified: Secondary | ICD-10-CM | POA: Insufficient documentation

## 2020-01-20 DIAGNOSIS — R2681 Unsteadiness on feet: Secondary | ICD-10-CM | POA: Diagnosis not present

## 2020-01-20 DIAGNOSIS — M6281 Muscle weakness (generalized): Secondary | ICD-10-CM | POA: Diagnosis not present

## 2020-01-20 NOTE — Therapy (Signed)
[facility] [address] [place], [place], [address] Phone: [phone]   Fax:  [phone]  Physical Therapy Treatment  Patient Details  Name: Derrick White MRN: [id number] Date of Birth: 07/24/1940 Referring Provider (PT): Dr. Domenick Gong   Encounter Date: 01/20/2020   PT End of Session - 01/20/20 1241    Visit Number 13    Number of Visits 16    Date for PT Re-Evaluation 03/06/20    Authorization Type Medicare, PN/Re-cert 2/69/48    Authorization - Number of Visits 13    Progress Note Due on Visit 16    PT Start Time 1150    PT Stop Time 1230    PT Time Calculation (min) 40 min    Equipment Utilized During Treatment Gait belt    Activity Tolerance Patient tolerated treatment well    Behavior During Therapy [facility] for tasks assessed/performed           Past Medical History:  Diagnosis Date  . C6 radiculopathy 05/04/2019  . Cerebellar ataxia ([place])   . Dysesthesia 06/04/2016  . Essential hypertension 11/05/2019  . Foot pain, 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  . Gait disturbance 03/16/2019  . History of cervical spinal surgery 06/04/2016  . History of hiatal hernia   . History of kidney stones   . Hypertension   . 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 Memory loss 03/16/2019  . 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  . Pain due to onychomycosis of toenails of both feet 04/28/2019  . Pain in left ankle and joints of left foot 08/06/2018  . Slurred speech 03/16/2019  . 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 TIA (transient ischemic attack) 11/05/2019  . Worsened handwriting 03/16/2019    Past Surgical History:  Procedure Laterality Date  . APPENDECTOMY     1940s  . [place]  . FRACTURE SURGERY     Left ankle, sx x4  . INGUINAL HERNIA REPAIR Right 03/05/2019   Procedure: OPEN RIGHT INGUINAL HERNIA REPAIR WITH MESH;  Surgeon: Clovis Riley, MD;  Location: [place];  Service:  General;  Laterality: Right;  . KNEE SURGERY Left 1985  . SHOULDER SURGERY Left   . TONSILLECTOMY     1945    There were no vitals filed for this visit.   Subjective Assessment - 01/20/20 1237    Subjective He got the OTC orthotics yesterday afternoon. currently pain is in toes more than ankle. 1.5/10    Pertinent History HTN, cerebellar ataxia, oropharyngeal dysphagia, Sialorrhea, Chronic R ankle pain    Limitations Walking;Other (comment)    How long can you sit comfortably? no issues    How long can you stand comfortably? 10 min    How long can you walk comfortably? 200 yards    Diagnostic tests 11/05/19: IMPRESSION:1. 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 Moderate chronic small vessel ischemic disease.3. Motion degraded head MRA without evidence of a large or mediumvessel occlusion or significant proximal stenosis.4. Negative neck MRA.  IMPRESSION:1. 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 Mild spinal stenosis and mild-to-moderate neural foraminalstenosis at C6-7, stable to minimally progressed.    Patient Stated Goals walk 1 mile comfortably, come down stairs better    Currently in Pain? Yes    Pain Score 2     Pain Location Ankle    Pain Orientation Left  Pain Descriptors / Indicators Aching                Manual therapy: Myofascial release to intertarsal space in toes 3-4 and 4-5. Grade II traction mobilization to 1-5th MTP and IP joints Grade III MTP mobilization to improve flexion and extension  TherEx: Supine toe curls: manually resisted: 3 x 10  Tandem balance: foam surface with EC: 3 x 30" R and L Tandem balance: 1/2 foam roll: EO: 3 x 30" R and L Walking tandem: 20x Walking fwd and bwd: EC: 3 x 15 feet each                             PT Short Term Goals - 01/17/20 1024      PT SHORT TERM GOAL #1   Title  Patient will be able to perform sit to stand without HHA without pushing knees against chair and with one trial only    Baseline pushes back of knee agianst chair, takes several tries; able to perform sit to stand without arm support (12/24/19)    Time 3    Period Weeks    Status Achieved    Target Date 12/20/19      PT SHORT TERM GOAL #2   Title Patient will be able to ambulate 25 min with straight cane for 3x/week to improve compliance with walking program    Baseline 200 yards, able to walk 3/4 to 1 mile with one walking stick (12/24/19)    Time 3    Period Weeks    Status Achieved    Target Date 12/20/19      PT SHORT TERM GOAL #3   Title Patient will be able to come down stairs with reciprocal gait pattern and use of one rail to improve ability to come down stairs without significant ankle pain    Baseline step to pattern, increased L aknle pain with reciprocal gait pattern    Time 3    Period Weeks    Status Achieved    Target Date 12/20/19             PT Long Term Goals - 01/10/20 1029      PT LONG TERM GOAL #1   Title Patient will demo <12 seconds on normal TUG to improve functional mobility and transfers    Baseline 16 seconds (normal); 20 seconds with carrying 15lbs in R hand; 19 sec with carrying 15lbs in L Hand; 13 seconds normal TUG (01/10/20), 14 seconds with carrying 15lbs in R hand (01/10/20)    Time 4    Period Weeks    Status On-going    Target Date 02/07/20      PT LONG TERM GOAL #2   Title Patient will be able to ambulate for 40 min for 2x/week to improve walking endurance    Baseline 200 yards; able to walk 30 min daily (01/10/20)    Time 4    Period Weeks    Status On-going    Target Date 02/07/20                 Plan - 01/20/20 1238    Clinical Impression Statement Today's skilled session was focused on improving mobility in MTP and IP joints of L foot. Patient has difficulty with balance on non compliant surfaces and also when vision is  challanged. Pt will continue to benefit from skilled PT to improve foot mobility and improve balance.  Personal Factors and Comorbidities Age;Comorbidity 2;Past/Current Experience;Time since onset of injury/illness/exacerbation    Comorbidities HTN, cerebellar ataxia, oropharyngeal dysphagia, Sialorrhea, Chronic R ankle pain    Examination-Activity Limitations Carry;Lift;Squat;Stairs;Stand;Transfers    Examination-Participation Restrictions Community Activity;Laundry;Shop;Yard Work    [employer] Evolving/Moderate complexity    Rehab Potential Good    PT Frequency 2x / week    PT Duration 4 weeks    PT Treatment/Interventions ADLs/Self Care Home Management;Gait training;Stair training;Functional mobility training;Therapeutic exercise;Therapeutic activities;Balance training;Neuromuscular re-education;Manual techniques;Patient/family education;Passive range of motion;Visual/perceptual remediation/compensation;Joint Manipulations    PT Home Exercise Plan Walking program: walk 20 min with cane 3x/week;Access Code: [email or web address]    Consulted and Agree with Plan of Care Patient           Patient will benefit from skilled therapeutic intervention in order to improve the following deficits and impairments:  Abnormal gait, Decreased balance, Decreased activity tolerance, Decreased endurance, Decreased coordination, Decreased mobility, Decreased range of motion, Decreased strength, Difficulty walking, Postural dysfunction, Pain  Visit Diagnosis: Muscle weakness (generalized)  Ataxia  Pain in left ankle and joints of left foot  Unsteadiness on feet     Problem List Patient Active Problem List   Diagnosis Date Noted  . TIA (transient ischemic attack) 11/05/2019  . Essential hypertension 11/05/2019  . Cerebellar ataxia ([place]) 11/05/2019  . 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 C6 radiculopathy 05/04/2019  . Pain due to onychomycosis of toenails of both feet  04/28/2019  . Worsened handwriting 03/16/2019  . Gait disturbance 03/16/2019  . Slurred speech 03/16/2019  . Memory loss 03/16/2019  . Pain in left ankle and joints of left foot 08/06/2018  . 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  . Foot pain, 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  . Dysesthesia 06/04/2016  . 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 History of cervical spinal surgery 06/04/2016    Kerrie Pleasure 01/20/2020, 12:49 PM  [facility] [address] [place], [place], [address] Phone: [phone]   Fax:  [phone]  Name: Derrick White MRN: [id number] Date of Birth: 14-Mar-1941

## 2020-01-20 NOTE — Telephone Encounter (Signed)
Received fax of Cardiac Report from [employer]. On 01/19/20 at 3:35 pm (CT) patient's monitor showed Atrial Fibrillation RVR Sustained 180 bpm.  Dr. Acie Fredrickson, DOD, reviewed monitor, recommend continue eliquis.  Patient was wondering when he needed to follow up. Will forward to Dr. Harrington Challenger and her nurse for advisement.

## 2020-01-20 NOTE — Telephone Encounter (Signed)
I would like to have full monitor results before seeing back in clinic

## 2020-01-21 DIAGNOSIS — I639 Cerebral infarction, unspecified: Secondary | ICD-10-CM | POA: Diagnosis not present

## 2020-01-21 DIAGNOSIS — G119 Hereditary ataxia, unspecified: Secondary | ICD-10-CM | POA: Diagnosis not present

## 2020-01-21 DIAGNOSIS — I4891 Unspecified atrial fibrillation: Secondary | ICD-10-CM | POA: Diagnosis not present

## 2020-01-21 DIAGNOSIS — Z8673 Personal history of transient ischemic attack (TIA), and cerebral infarction without residual deficits: Secondary | ICD-10-CM | POA: Diagnosis not present

## 2020-01-21 DIAGNOSIS — Z7901 Long term (current) use of anticoagulants: Secondary | ICD-10-CM | POA: Diagnosis not present

## 2020-01-21 DIAGNOSIS — Z79899 Other long term (current) drug therapy: Secondary | ICD-10-CM | POA: Diagnosis not present

## 2020-01-25 ENCOUNTER — Ambulatory Visit: Payer: Self-pay

## 2020-01-25 ENCOUNTER — Ambulatory Visit: Payer: Medicare Other | Attending: Internal Medicine

## 2020-01-25 DIAGNOSIS — Z23 Encounter for immunization: Secondary | ICD-10-CM

## 2020-01-25 NOTE — Progress Notes (Signed)
   Covid-19 Vaccination Clinic  Name:  Derrick White    MRN: [id number] DOB: May 29, 1940  01/25/2020  [name] was observed post Covid-19 immunization for 15 minutes without incident. He was provided with Vaccine Information Sheet and instruction to access the V-Safe system.   [name] was instructed to call 911 with any severe reactions post vaccine: Marland Kitchen Difficulty breathing  . Swelling of face and throat  . A fast heartbeat  . A bad rash all over body  . Dizziness and weakness

## 2020-01-26 ENCOUNTER — Encounter: Payer: Medicare Other | Admitting: Speech Pathology

## 2020-01-26 NOTE — Telephone Encounter (Signed)
Called patient and scheduled w Dr. Harrington Challenger follow up event monitor 03/02/20.  Recent monitor report from [employer]: From Day 6 - serious event 01/20/20 at 1:16 pm CST Auto triggered  Atrial fibrillation RVR sustained.  Rate was 180.  The patient does not recall noticing any symptoms.   Dr. Harrington Challenger to review this afternoon.

## 2020-01-27 DIAGNOSIS — Z125 Encounter for screening for malignant neoplasm of prostate: Secondary | ICD-10-CM | POA: Diagnosis not present

## 2020-01-27 DIAGNOSIS — E7800 Pure hypercholesterolemia, unspecified: Secondary | ICD-10-CM | POA: Diagnosis not present

## 2020-01-27 DIAGNOSIS — M859 Disorder of bone density and structure, unspecified: Secondary | ICD-10-CM | POA: Diagnosis not present

## 2020-01-28 DIAGNOSIS — E7800 Pure hypercholesterolemia, unspecified: Secondary | ICD-10-CM | POA: Diagnosis not present

## 2020-01-28 DIAGNOSIS — Z1212 Encounter for screening for malignant neoplasm of rectum: Secondary | ICD-10-CM | POA: Diagnosis not present

## 2020-01-28 DIAGNOSIS — R82998 Other abnormal findings in urine: Secondary | ICD-10-CM | POA: Diagnosis not present

## 2020-01-31 ENCOUNTER — Encounter: Payer: Medicare Other | Admitting: Speech Pathology

## 2020-01-31 ENCOUNTER — Other Ambulatory Visit: Payer: Self-pay

## 2020-01-31 ENCOUNTER — Ambulatory Visit: Payer: Medicare Other

## 2020-01-31 MED ORDER — ATORVASTATIN CALCIUM 40 MG PO TABS: 40.0000 mg | ORAL_TABLET | Freq: Every day | ORAL | 3 refills | Status: DC

## 2020-02-01 ENCOUNTER — Other Ambulatory Visit: Payer: Self-pay

## 2020-02-01 ENCOUNTER — Ambulatory Visit: Payer: Medicare Other

## 2020-02-01 DIAGNOSIS — G459 Transient cerebral ischemic attack, unspecified: Secondary | ICD-10-CM | POA: Diagnosis not present

## 2020-02-01 DIAGNOSIS — R2681 Unsteadiness on feet: Secondary | ICD-10-CM | POA: Diagnosis not present

## 2020-02-01 DIAGNOSIS — M6281 Muscle weakness (generalized): Secondary | ICD-10-CM | POA: Diagnosis not present

## 2020-02-01 DIAGNOSIS — R270 Ataxia, unspecified: Secondary | ICD-10-CM | POA: Diagnosis not present

## 2020-02-01 DIAGNOSIS — M25572 Pain in left ankle and joints of left foot: Secondary | ICD-10-CM | POA: Diagnosis not present

## 2020-02-01 NOTE — Therapy (Signed)
[facility] [address] [place], [place], [address] Phone: [phone]   Fax:  [phone]  Physical Therapy Treatment  Patient Details  Name: Derrick White Nickson MRN: [id number] Date of Birth: 12-30-1940 Referring Provider (PT): Dr. Domenick Gong   Encounter Date: 02/01/2020   PT End of Session - 02/01/20 1144    Visit Number 14    Number of Visits 16    Date for PT Re-Evaluation 03/06/20    Authorization Type Medicare, PN/Re-cert 8/52/77    Authorization - Number of Visits 13    Progress Note Due on Visit 16    Equipment Utilized During Treatment Gait belt    Activity Tolerance Patient tolerated treatment well    Behavior During Therapy [facility] for tasks assessed/performed           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   Cerebellar ataxia ([place])    Dysesthesia 06/04/2016   Essential hypertension 11/05/2019   Foot pain, 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   Gait disturbance 03/16/2019   History of cervical spinal surgery 06/04/2016   History of hiatal hernia    History of kidney stones    Hypertension    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   Memory loss 03/16/2019   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   Pain due to onychomycosis of toenails of both feet 04/28/2019   Pain in left ankle and joints of left foot 08/06/2018   Slurred speech 03/16/2019   Superficial peroneal nerve neuropathy, 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   Subjective Assessment - 02/01/20 1143    Subjective pain is 1.5/10 with walking and mainly in top of the toes.    Pertinent History HTN, cerebellar ataxia, oropharyngeal dysphagia, Sialorrhea, Chronic R ankle pain    Limitations Walking;Other (comment)    How long can you sit comfortably? no issues    How long can you stand comfortably? 10 min    How long can you walk comfortably? 200 yards    Diagnostic tests 11/05/19: IMPRESSION:1. 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 Moderate chronic small vessel ischemic disease.3. Motion degraded head MRA without evidence of a large or mediumvessel occlusion or significant proximal stenosis.4. Negative neck MRA.  IMPRESSION:1. 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 Mild spinal stenosis and mild-to-moderate neural foraminalstenosis at C6-7, stable to minimally progressed.    Patient Stated Goals walk 1 mile comfortably, come down stairs better                Manual therapy: Myofascial release to intertarsal space in toes 3-4 and 4-5. Grade II traction mobilization to 1-5th MTP and IP joints Grade III MTP, IP mobilization to improve flexion and extension Grade III intertarsal joint mobilization L foot TherEx: Supine  toe curls: manually resisted: 3 x 10 Manuallly resisted ankle inversion and eversion: 2 x 10 Cone taps: 24" high taps: 2 x 10 R and L, min A required Standing downhill with cone taps: 12" hight: 2 x 10 R and L Walking tandem: 3 x 15 feet Walking fwd and bwd: EC: 3 x 15 feet each SLS: 3 x 30"  Rand L Unilateral heel raises: 10x R and L                                            PT Short Term Goals - 01/17/20 1024      PT SHORT TERM GOAL #1   Title Patient will be able to perform sit to stand  without HHA without pushing knees against chair and with one trial only    Baseline pushes back of knee agianst chair, takes several tries; able to perform sit to stand without arm support (12/24/19)    Time 3    Period Weeks    Status Achieved    Target Date 12/20/19      PT SHORT TERM GOAL #2   Title Patient will be able to ambulate 25 min with straight cane for 3x/week to improve compliance with walking program    Baseline 200 yards, able to walk 3/4 to 1 mile with one walking stick (12/24/19)    Time 3    Period Weeks    Status Achieved    Target Date 12/20/19      PT SHORT TERM GOAL #3   Title Patient will be able to come down stairs with reciprocal gait pattern and use of one rail to improve ability to come down stairs without significant ankle pain    Baseline step to pattern, increased L aknle pain with reciprocal gait pattern    Time 3    Period Weeks    Status Achieved    Target Date 12/20/19             PT Long Term Goals - 01/10/20 1029      PT LONG TERM GOAL #1   Title Patient will demo <12 seconds on normal TUG to improve functional mobility and transfers    Baseline 16 seconds (normal); 20 seconds with carrying 15lbs in R hand; 19 sec with carrying 15lbs in L Hand; 13 seconds normal TUG (01/10/20), 14 seconds with carrying 15lbs in R hand (01/10/20)    Time 4    Period Weeks    Status On-going    Target Date 02/07/20      PT LONG TERM GOAL #2   Title Patient will be able to ambulate for 40 min for 2x/week to improve walking endurance    Baseline 200 yards; able to walk 30 min daily (01/10/20)    Time 4    Period Weeks    Status On-going    Target Date 02/07/20                 Plan - 02/01/20 1147    Clinical Impression Statement Forefoot mobility in R foot is significantly improving and pt has reported significant improvement in foot/ankle pain since initial evaluation.    Personal Factors and Comorbidities Age;Comorbidity 2;Past/Current  Experience;Time since onset of injury/illness/exacerbation    Comorbidities HTN, cerebellar ataxia, oropharyngeal dysphagia, Sialorrhea, Chronic R ankle pain    Examination-Activity Limitations Carry;Lift;Squat;Stairs;Stand;Transfers  Examination-Participation Restrictions Community Activity;Laundry;Shop;Yard Work    [employer] officer Evolving/Moderate complexity    Rehab Potential Good    PT Frequency 2x / week    PT Duration 4 weeks    PT Treatment/Interventions ADLs/Self Care Home Management;Gait training;Stair training;Functional mobility training;Therapeutic exercise;Therapeutic activities;Balance training;Neuromuscular re-education;Manual techniques;Patient/family education;Passive range of motion;Visual/perceptual remediation/compensation;Joint Manipulations    PT Home Exercise Plan Walking program: walk 20 min with cane 3x/week;Access Code: [email or web address]    [email or web address] and Agree with Plan of Care Patient           Patient will benefit from skilled therapeutic intervention in order to improve the following deficits and impairments:  Abnormal gait, Decreased balance, Decreased activity tolerance, Decreased endurance, Decreased coordination, Decreased mobility, Decreased range of motion, Decreased strength, Difficulty walking, Postural dysfunction, Pain  Visit Diagnosis: Muscle weakness (generalized)  Ataxia  Pain in left ankle and joints of left foot  Unsteadiness on feet  TIA (transient ischemic attack)     Problem List Patient Active Problem List   Diagnosis Date Noted   TIA (transient ischemic attack) 11/05/2019   Essential hypertension 11/05/2019   Cerebellar ataxia ([place]) 11/05/2019   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   C6 radiculopathy 05/04/2019   Pain due to onychomycosis of toenails of both feet 04/28/2019   Worsened handwriting 03/16/2019   Gait disturbance 03/16/2019   Slurred speech 03/16/2019   Memory loss 03/16/2019    Pain in left ankle and joints of left foot 08/06/2018   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   Foot pain, 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   Dysesthesia 06/04/2016   Superficial peroneal nerve neuropathy, 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

## 2020-02-02 ENCOUNTER — Encounter: Payer: Medicare Other | Admitting: Speech Pathology

## 2020-02-03 ENCOUNTER — Other Ambulatory Visit: Payer: Self-pay

## 2020-02-03 ENCOUNTER — Ambulatory Visit: Payer: Medicare Other

## 2020-02-03 DIAGNOSIS — G4700 Insomnia, unspecified: Secondary | ICD-10-CM | POA: Diagnosis not present

## 2020-02-03 DIAGNOSIS — M25572 Pain in left ankle and joints of left foot: Secondary | ICD-10-CM

## 2020-02-03 DIAGNOSIS — M1990 Unspecified osteoarthritis, unspecified site: Secondary | ICD-10-CM | POA: Diagnosis not present

## 2020-02-03 DIAGNOSIS — R1312 Dysphagia, oropharyngeal phase: Secondary | ICD-10-CM | POA: Diagnosis not present

## 2020-02-03 DIAGNOSIS — R270 Ataxia, unspecified: Secondary | ICD-10-CM | POA: Diagnosis not present

## 2020-02-03 DIAGNOSIS — R2681 Unsteadiness on feet: Secondary | ICD-10-CM | POA: Diagnosis not present

## 2020-02-03 DIAGNOSIS — G9050 Complex regional pain syndrome I, unspecified: Secondary | ICD-10-CM | POA: Diagnosis not present

## 2020-02-03 DIAGNOSIS — K219 Gastro-esophageal reflux disease without esophagitis: Secondary | ICD-10-CM | POA: Diagnosis not present

## 2020-02-03 DIAGNOSIS — G459 Transient cerebral ischemic attack, unspecified: Secondary | ICD-10-CM

## 2020-02-03 DIAGNOSIS — M6281 Muscle weakness (generalized): Secondary | ICD-10-CM

## 2020-02-03 DIAGNOSIS — M5136 Other intervertebral disc degeneration, lumbar region: Secondary | ICD-10-CM | POA: Diagnosis not present

## 2020-02-03 DIAGNOSIS — M8580 Other specified disorders of bone density and structure, unspecified site: Secondary | ICD-10-CM | POA: Diagnosis not present

## 2020-02-03 DIAGNOSIS — Z0000 Encounter for general adult medical examination without abnormal findings: Secondary | ICD-10-CM | POA: Diagnosis not present

## 2020-02-03 DIAGNOSIS — Z23 Encounter for immunization: Secondary | ICD-10-CM | POA: Diagnosis not present

## 2020-02-03 DIAGNOSIS — G3184 Mild cognitive impairment, so stated: Secondary | ICD-10-CM | POA: Diagnosis not present

## 2020-02-03 DIAGNOSIS — E7800 Pure hypercholesterolemia, unspecified: Secondary | ICD-10-CM | POA: Diagnosis not present

## 2020-02-03 DIAGNOSIS — N401 Enlarged prostate with lower urinary tract symptoms: Secondary | ICD-10-CM | POA: Diagnosis not present

## 2020-02-03 NOTE — Therapy (Signed)
[facility] [address] [place], [place], [address] Phone: [phone]   Fax:  [phone]  Physical Therapy Treatment  Patient Details  Name: Derrick White MRN: [id number] Date of Birth: June 29, 1940 Referring Provider (PT): Dr. Domenick Gong   Encounter Date: 02/03/2020   PT End of Session - 02/03/20 1511    Visit Number 15    Number of Visits 16    Date for PT Re-Evaluation 03/06/20    Authorization Type Medicare, PN/Re-cert 6/76/19    Authorization - Number of Visits 15    Progress Note Due on Visit 16    PT Start Time 5093    PT Stop Time 1445    PT Time Calculation (min) 40 min    Equipment Utilized During Treatment Gait belt    Activity Tolerance Patient tolerated treatment well    Behavior During Therapy [facility] for tasks assessed/performed           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   Cerebellar ataxia ([place])    Dysesthesia 06/04/2016   Essential hypertension 11/05/2019   Foot pain, 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   Gait disturbance 03/16/2019   History of cervical spinal surgery 06/04/2016   History of hiatal hernia    History of kidney stones    Hypertension    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   Memory loss 03/16/2019   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   Pain due to onychomycosis of toenails of both feet 04/28/2019   Pain in left ankle and joints of left foot 08/06/2018   Slurred speech 03/16/2019   Superficial peroneal nerve neuropathy, 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   Subjective Assessment - 02/03/20 1433    Subjective Doing well. Walked >1 mile yesterday.    Pertinent History HTN, cerebellar ataxia, oropharyngeal dysphagia, Sialorrhea, Chronic R ankle pain    Limitations Walking;Other (comment)    How long can you sit comfortably? no issues    How long can you stand comfortably? 10 min    How long can you walk comfortably? 200 yards    Diagnostic tests 11/05/19: IMPRESSION:1. 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 Moderate chronic small vessel ischemic disease.3. Motion degraded head MRA without evidence of a large or mediumvessel occlusion or significant proximal stenosis.4. Negative neck MRA.  IMPRESSION:1. 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 Mild spinal stenosis and mild-to-moderate neural foraminalstenosis at C6-7, stable to minimally progressed.    Patient Stated Goals walk 1 mile comfortably, come down stairs better            Manual therapy: Myofascial release to anterior ankle Grade IV MTP joint mobilization to improve flexion and extension: digit 1-5  Following exercises were reveiwed as final HEP with  patient Exercises Seated Self Great Toe Stretch - 1 x daily - 7 x weekly - 10 reps - 10 sec hold Seated Toe Flexion Extension PROM - 1 x daily - 7 x weekly - 10 reps - 10 sec hold Standing Soleus Stretch on Step - 1 x daily - 7 x weekly - 3 reps - 30 sec hold Standing Gastroc Stretch on Foam 1/2 Roll - 1 x daily - 7 x weekly - 3 reps - 30 secc hold Single Leg Heel Raise - 1 x daily - 7 x weekly - 3 sets - 10 reps Single Leg Stance - 1 x daily - 7 x weekly - 3 reps - 30 secc hold                           PT Short Term Goals - 01/17/20 1024       PT SHORT TERM GOAL #1   Title Patient will be able to perform sit to stand without HHA without pushing knees against chair and with one trial only    Baseline pushes back of knee agianst chair, takes several tries; able to perform sit to stand without arm support (12/24/19)    Time 3    Period Weeks    Status Achieved    Target Date 12/20/19      PT SHORT TERM GOAL #2   Title Patient will be able to ambulate 25 min with straight cane for 3x/week to improve compliance with walking program    Baseline 200 yards, able to walk 3/4 to 1 mile with one walking stick (12/24/19)    Time 3    Period Weeks    Status Achieved    Target Date 12/20/19      PT SHORT TERM GOAL #3   Title Patient will be able to come down stairs with reciprocal gait pattern and use of one rail to improve ability to come down stairs without significant ankle pain    Baseline step to pattern, increased L aknle pain with reciprocal gait pattern    Time 3    Period Weeks    Status Achieved    Target Date 12/20/19             PT Long Term Goals - 01/10/20 1029      PT LONG TERM GOAL #1   Title Patient will demo <12 seconds on normal TUG to improve functional mobility and transfers    Baseline 16 seconds (normal); 20 seconds with carrying 15lbs in R hand; 19 sec with carrying 15lbs in L Hand; 13 seconds normal TUG (01/10/20), 14 seconds with carrying 15lbs in R hand (01/10/20)    Time 4    Period Weeks    Status On-going    Target Date 02/07/20      PT LONG TERM GOAL #2   Title Patient will be able to ambulate for 40 min for 2x/week to improve walking endurance    Baseline 200 yards; able to walk 30 min daily (01/10/20)    Time 4    Period Weeks    Status On-going    Target Date 02/07/20                 Plan - 02/03/20 1510    Clinical Impression Statement Today's session was focused on initiating final HEP and reviewing it with patient and start discharge planning for next session. Patient is  compliant with walking 1 mile  3-4/xweek and is reporting pain <2/10. Patient will benefit from one more session to review HEP and discharge.    Personal Factors and Comorbidities Age;Comorbidity 2;Past/Current Experience;Time since onset of injury/illness/exacerbation    Comorbidities HTN, cerebellar ataxia, oropharyngeal dysphagia, Sialorrhea, Chronic R ankle pain    Examination-Activity Limitations Carry;Lift;Squat;Stairs;Stand;Transfers    Examination-Participation Restrictions Community Activity;Laundry;Shop;Yard Work    [employer]chant [employer] officer Evolving/Moderate complexity    Rehab Potential Good    PT Frequency 2x / week    PT Duration 4 weeks    PT Treatment/Interventions ADLs/Self Care Home Management;Gait training;Stair training;Functional mobility training;Therapeutic exercise;Therapeutic activities;Balance training;Neuromuscular re-education;Manual techniques;Patient/family education;Passive range of motion;Visual/perceptual remediation/compensation;Joint Manipulations    PT Next Visit Plan Discharge next session    PT Home Exercise Plan Walking program: walk 20 min with cane 3x/week;Access Code: [email or web address]    [email or web address] and Agree with Plan of Care Patient           Patient will benefit from skilled therapeutic intervention in order to improve the following deficits and impairments:  Abnormal gait, Decreased balance, Decreased activity tolerance, Decreased endurance, Decreased coordination, Decreased mobility, Decreased range of motion, Decreased strength, Difficulty walking, Postural dysfunction, Pain  Visit Diagnosis: Muscle weakness (generalized)  Ataxia  Pain in left ankle and joints of left foot  Unsteadiness on feet  TIA (transient ischemic attack)     Problem List Patient Active Problem List   Diagnosis Date Noted   TIA (transient ischemic attack) 11/05/2019   Essential hypertension 11/05/2019   Cerebellar ataxia ([place]) 11/05/2019   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 05/04/2019   C6 radiculopathy 05/04/2019   Pain due to onychomycosis of toenails of both feet 04/28/2019   Worsened handwriting 03/16/2019   Gait disturbance 03/16/2019   Slurred speech 03/16/2019   Memory loss 03/16/2019   Pain in left ankle and joints of left foot 08/06/2018   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   Foot pain, 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   Dysesthesia 06/04/2016   Superficial peroneal nerve neuropathy, 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 [place], [place], [address] Phone: [phone]   Fax:  [phone]  Name: Derrick White MRN: [id number] Date of Birth: Aug 05, 1940

## 2020-02-07 ENCOUNTER — Encounter: Payer: Medicare Other | Admitting: Speech Pathology

## 2020-02-07 ENCOUNTER — Ambulatory Visit: Payer: Medicare Other

## 2020-02-08 ENCOUNTER — Ambulatory Visit: Payer: Medicare Other

## 2020-02-09 ENCOUNTER — Encounter: Payer: Medicare Other | Admitting: Speech Pathology

## 2020-02-10 ENCOUNTER — Ambulatory Visit: Payer: Medicare Other

## 2020-02-11 ENCOUNTER — Ambulatory Visit: Payer: Medicare Other

## 2020-02-11 ENCOUNTER — Other Ambulatory Visit: Payer: Self-pay

## 2020-02-11 DIAGNOSIS — G459 Transient cerebral ischemic attack, unspecified: Secondary | ICD-10-CM

## 2020-02-11 DIAGNOSIS — R2681 Unsteadiness on feet: Secondary | ICD-10-CM

## 2020-02-11 DIAGNOSIS — M25572 Pain in left ankle and joints of left foot: Secondary | ICD-10-CM | POA: Diagnosis not present

## 2020-02-11 DIAGNOSIS — M6281 Muscle weakness (generalized): Secondary | ICD-10-CM

## 2020-02-11 DIAGNOSIS — R270 Ataxia, unspecified: Secondary | ICD-10-CM | POA: Diagnosis not present

## 2020-02-11 NOTE — Therapy (Signed)
[facility] [address] [place], [place], [address] Phone: [phone]   Fax:  [phone]  Physical Therapy Discharge Summary  Patient Details  Name: Derrick White MRN: [id number] Date of Birth: 06-01-1940 Referring Provider (PT): Dr. Domenick Gong   Encounter Date: 02/11/2020   PT End of Session - 02/11/20 1239    Visit Number 16    Number of Visits 16    Date for PT Re-Evaluation 03/06/20    Authorization Type Medicare, PN/Re-cert 11/17/14    Authorization - Number of Visits 16    Progress Note Due on Visit 16    PT Start Time 1100    PT Stop Time 1140    PT Time Calculation (min) 40 min    Equipment Utilized During Treatment Gait belt    Activity Tolerance Patient tolerated treatment well    Behavior During Therapy [facility] for tasks assessed/performed           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   Cerebellar ataxia ([place])    Dysesthesia 06/04/2016   Essential hypertension 11/05/2019   Foot pain, 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   Gait disturbance 03/16/2019   History of cervical spinal surgery 06/04/2016   History of hiatal hernia    History of kidney stones    Hypertension    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   Memory loss 03/16/2019   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   Pain due to onychomycosis of toenails of both feet 04/28/2019   Pain in left ankle and joints of left foot 08/06/2018   Slurred speech 03/16/2019   Superficial peroneal nerve neuropathy, 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   Subjective Assessment - 02/11/20 1240    Subjective Doing well. Ankle and foot pain is about the same, 1.5 to 2/10 after walking. It was aching more because of rain last couple of days and he couldn't go out to walk due to rain.Marland Kitchen    Pertinent History HTN, cerebellar ataxia, oropharyngeal dysphagia, Sialorrhea, Chronic R ankle pain    Limitations Walking;Other (comment)    How long can you sit comfortably? no issues    How long can you stand comfortably? 10 min    How long can you walk comfortably? 200 yards    Diagnostic tests 11/05/19: IMPRESSION:1. 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 Moderate chronic small vessel ischemic disease.3. Motion degraded head MRA without evidence of a large or mediumvessel occlusion or significant proximal stenosis.4. Negative neck MRA.  IMPRESSION:1. 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 Mild spinal stenosis and mild-to-moderate neural foraminalstenosis at C6-7, stable to minimally progressed.    Patient Stated Goals walk 1 mile comfortably, come down stairs better  [facility] PT Assessment - 02/11/20 1136      Timed Up and Go Test   Normal TUG (seconds) 12                                   PT Short Term Goals - 02/11/20 1132      PT SHORT TERM GOAL #1   Title Patient will be able to perform sit to stand without HHA without pushing knees against chair and with one trial only    Baseline pushes back of knee agianst chair, takes several tries; able to perform sit to stand without arm support (12/24/19)    Time 3    Period Weeks    Status Achieved    Target Date 12/20/19      PT SHORT TERM GOAL #2   Title Patient will be able to  ambulate 25 min with straight cane for 3x/week to improve compliance with walking program    Baseline 200 yards, able to walk 3/4 to 1 mile with one walking stick (12/24/19)    Time 3    Period Weeks    Status Achieved    Target Date 12/20/19      PT SHORT TERM GOAL #3   Title Patient will be able to come down stairs with reciprocal gait pattern and use of one rail to improve ability to come down stairs without significant ankle pain    Baseline step to pattern, increased L aknle pain with reciprocal gait pattern    Time 3    Period Weeks    Status Achieved    Target Date 12/20/19             PT Long Term Goals - 02/11/20 1132      PT LONG TERM GOAL #1   Title Patient will demo <12 seconds on normal TUG to improve functional mobility and transfers    Baseline 16 seconds (normal); 20 seconds with carrying 15lbs in R hand; 19 sec with carrying 15lbs in L Hand; 13 seconds normal TUG (01/10/20), 14 seconds with carrying 15lbs in R hand (01/10/20); 12 sec normal TUG    Time 4    Period Weeks    Status Achieved      PT LONG TERM GOAL #2   Title Patient will be able to ambulate for 40 min for 2x/week to improve walking endurance    Baseline 200 yards; able to walk 30 min daily (01/10/20); 55 min 02/11/20 longest he has walked since start of PT    Time 4    Period Weeks    Status Achieved                 Plan - 02/11/20 1242    Clinical Impression Statement Patient has been seen for total of 10 sessions from 11/29/19 to 02/11/20. Patient has met all of his short term goals and long term goals. Patient is able to walk 30-60 min with or without AD. Current ankle/foot pain is <2/10 after his walking. Patient is currently I and compliant with HEP and has reached maximum potential from skilled PT. Patient will be discharged from skilled PT with independent home exercise program to manage his symptoms.    Personal Factors and Comorbidities Age;Comorbidity 2;Past/Current Experience;Time since  onset of injury/illness/exacerbation    Comorbidities HTN, cerebellar ataxia, oropharyngeal dysphagia, Sialorrhea, Chronic R ankle pain    Examination-Activity Limitations Carry;Lift;Squat;Stairs;Stand;Transfers  Examination-Participation Restrictions Community Activity;Laundry;Shop;Yard Work    [employer]chant [employer] officer Evolving/Moderate complexity    Rehab Potential Good    PT Treatment/Interventions Patient/family education    PT Next Visit Plan Discharge    PT Home Exercise Plan Walking program: walk 20 min with cane 3x/week;Access Code: [email or web address]    [email or web address] and Agree with Plan of Care Patient           Patient will benefit from skilled therapeutic intervention in order to improve the following deficits and impairments:  Abnormal gait, Decreased balance, Decreased activity tolerance, Decreased endurance, Decreased coordination, Decreased mobility, Decreased range of motion, Decreased strength, Difficulty walking, Postural dysfunction, Pain  Visit Diagnosis: Muscle weakness (generalized)  Ataxia  Pain in left ankle and joints of left foot  Unsteadiness on feet  TIA (transient ischemic attack)     Problem List Patient Active Problem List   Diagnosis Date Noted   TIA (transient ischemic attack) 11/05/2019   Essential hypertension 11/05/2019   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 [place], [place], [address] Phone: [phone]   Fax:  [phone]  Name: Derrick White MRN: [id number] Date of Birth: [birth year]

## 2020-02-14 ENCOUNTER — Ambulatory Visit: Payer: Medicare Other

## 2020-02-17 ENCOUNTER — Ambulatory Visit: Payer: Medicare Other

## 2020-03-02 ENCOUNTER — Ambulatory Visit: Payer: Medicare Other | Admitting: Internal Medicine

## 2020-03-03 ENCOUNTER — Telehealth: Payer: Self-pay | Admitting: Internal Medicine

## 2020-03-03 MED ORDER — APIXABAN 5 MG PO TABS: 5.0000 mg | ORAL_TABLET | Freq: Two times a day (BID) | ORAL | 1 refills | Status: DC

## 2020-03-03 NOTE — Telephone Encounter (Signed)
Advised wife that we cannot sample for another 2 months. She requested we send in Rx to mail order. [facility]

## 2020-03-03 NOTE — Telephone Encounter (Signed)
Patient calling the office for samples of medication:   1.  What medication and dosage are you requesting samples for?  apixaban (ELIQUIS) 5 MG TABS tablet  2.  Are you currently out of this medication? No, has two weeks left. Is wanting to know if they can receive samples to last through Nov-Dec. Due to the deductible being the reason for the current cost.

## 2020-03-03 NOTE — Telephone Encounter (Signed)
  Attempted to call pt, LMOM.

## 2020-03-07 ENCOUNTER — Ambulatory Visit: Payer: Medicare Other | Admitting: Podiatry

## 2020-03-07 ENCOUNTER — Other Ambulatory Visit: Payer: Self-pay

## 2020-03-21 DIAGNOSIS — L821 Other seborrheic keratosis: Secondary | ICD-10-CM | POA: Diagnosis not present

## 2020-03-21 DIAGNOSIS — L570 Actinic keratosis: Secondary | ICD-10-CM | POA: Diagnosis not present

## 2020-03-21 DIAGNOSIS — D225 Melanocytic nevi of trunk: Secondary | ICD-10-CM | POA: Diagnosis not present

## 2020-03-21 DIAGNOSIS — L814 Other melanin hyperpigmentation: Secondary | ICD-10-CM | POA: Diagnosis not present

## 2020-03-28 DIAGNOSIS — R3915 Urgency of urination: Secondary | ICD-10-CM | POA: Diagnosis not present

## 2020-03-28 DIAGNOSIS — N3941 Urge incontinence: Secondary | ICD-10-CM | POA: Diagnosis not present

## 2020-05-09 DIAGNOSIS — N3941 Urge incontinence: Secondary | ICD-10-CM | POA: Diagnosis not present

## 2020-06-02 ENCOUNTER — Ambulatory Visit (INDEPENDENT_AMBULATORY_CARE_PROVIDER_SITE_OTHER): Payer: Medicare Other | Admitting: Internal Medicine

## 2020-06-02 ENCOUNTER — Encounter: Payer: Self-pay | Admitting: Internal Medicine

## 2020-06-02 VITALS — BP 130/76 | HR 76 | Ht 70.0 in | Wt 183.0 lb

## 2020-06-02 DIAGNOSIS — I480 Paroxysmal atrial fibrillation: Secondary | ICD-10-CM

## 2020-06-02 NOTE — Progress Notes (Addendum)
Cardiology Office Note   Date:  06/02/2020   ID:  Derrick White, DOB 06-Jan-1941, MRN [id number]  PCP:  Haywood Pao, MD  Cardiologist:   Dorris Carnes, MD   Pt presents for f/u of paroxysmal afib  History of Present Illness: Derrick White is a 80 y.o. male with a history of hyperlipidemia, hypertension, CV disease.  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   MRA of head and neck normal   Echo showed LVEF 60 to 65%  Lipids :  LDL 82   Pt started on ASA and Plavix for 1 month then ASA   I saw him in clinic in [place]   Set up for an event monitor which showed PAF   He denied palpitations   Placed on Eliquis  Since seen he has done well from a cardiac standpoint.   Breathing is OK   No palpitations   No CP  No dizziness  He has been diagnosed with cerebellar ataxia and has had some falls   In last month fell on patio   No prodrome       Current Meds  Medication Sig  . acetaminophen (TYLENOL) 500 MG tablet Take 500 mg by mouth every 6 (six) hours as needed for moderate pain or headache.  Marland Kitchen apixaban (ELIQUIS) 5 MG TABS tablet Take 1 tablet (5 mg total) by mouth 2 (two) times daily.  Marland Kitchen atorvastatin (LIPITOR) 40 MG tablet Take 1 tablet (40 mg total) by mouth daily.  . Cholecalciferol 25 MCG (1000 UT) tablet Take 1,000 Units by mouth daily.   . clotrimazole (LOTRIMIN) 1 % cream 1 application to affected area  . cyanocobalamin 1000 MCG tablet Take 1,000 mcg by mouth daily.   Marland Kitchen ibuprofen (ADVIL) 200 MG tablet Take 200 mg by mouth every 6 (six) hours as needed for headache or moderate pain.  . NON FORMULARY Triflex medication for joints  . NON FORMULARY Legatrin medication OTC Restless leg  . oxybutynin (DITROPAN-XL) 10 MG 24 hr tablet Take 10 mg by mouth daily.  . pantoprazole (PROTONIX) 40 MG tablet Take 40 mg by mouth daily.   . pravastatin (PRAVACHOL) 40 MG tablet 1 tablet  . Probiotic Product  (ALIGN PO) Take by mouth as directed.  Marland Kitchen Propylene Glycol (SYSTANE BALANCE) 0.6 % SOLN Place 1 drop into both eyes daily as needed (burning).  Marland Kitchen telmisartan (MICARDIS) 80 MG tablet Take 80 mg by mouth daily.      Allergies:   Morphine, Codeine, Propoxyphene, and Shrimp [shellfish allergy]   Past Medical History:  Diagnosis Date  . C6 radiculopathy 05/04/2019  . Cerebellar ataxia ([place])   . Dysesthesia 06/04/2016  . Essential hypertension 11/05/2019  . Foot pain, 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  . Gait disturbance 03/16/2019  . History of cervical spinal surgery 06/04/2016  . History of hiatal hernia   . History of kidney stones   . Hypertension   . 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 Memory loss 03/16/2019  . 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  . Pain due to onychomycosis of toenails of both feet 04/28/2019  . Pain in left ankle and joints of left foot 08/06/2018  . Slurred speech 03/16/2019  . 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 TIA (transient ischemic attack) 11/05/2019  . Worsened handwriting 03/16/2019    Past Surgical History:  Procedure Laterality Date  . APPENDECTOMY  1940s  . [place]  . FRACTURE SURGERY     Left ankle, sx x4  . INGUINAL HERNIA REPAIR Right 03/05/2019   Procedure: OPEN RIGHT INGUINAL HERNIA REPAIR WITH MESH;  Surgeon: Clovis Riley, MD;  Location: [facility];  Service: General;  Laterality: Right;  . KNEE SURGERY Left 1985  . SHOULDER SURGERY Left   . TONSILLECTOMY     1945     Social History:  The patient  reports that he quit smoking about 27 years ago. His smoking use included cigarettes. He has a 30.00 pack-year smoking history. He has never used smokeless tobacco. He reports previous alcohol use of about 14.0 standard drinks of alcohol per week. He reports that he does not use drugs.   Family History:  The patient's family history is not on file.    ROS:  Please see the history of present illness. All  other systems are reviewed and  Negative to the above problem except as noted.    PHYSICAL EXAM: VS:  BP 130/76   Pulse 76   Ht 5\' 10"  (1.778 m)   Wt 183 lb (83 kg)   SpO2 98%   BMI 26.26 kg/m   GEN: Well nourished, well developed, in no acute distress  HEENT: normal  Neck: no JVD, no carotid bruits Cardiac:  Irreg irreg ; no murmurs  ,no LE edema  Respiratory:  clear to auscultation bilaterally, normal work of breathing GI: soft, nontender, nondistended, + BS  No hepatomegaly  MS: no deformity Moving all extremities   Skin: warm and dry, no rash Neuro:  Speech staggered  Exam deferred otherwise    EKG:  EKG is ordered today.  Atrial fibrllation  76 bpm    Echo :   June 2021  1. Left ventricular ejection fraction, by estimation, is 60 to 65%. The left ventricle has normal function. The left ventricle has no regional wall motion abnormalities. Left ventricular diastolic parameters were normal. 2. Right ventricular systolic function is normal. The right ventricular size is normal. There is normal pulmonary artery systolic pressure. 3. The mitral valve is normal in structure. No evidence of mitral valve regurgitation. No evidence of mitral stenosis. 4. The aortic valve is normal in structure. Aortic valve regurgitation is not visualized. No aortic stenosis is present. 5. Aortic dilatation noted. There is borderline dilatation of the aortic root. 6. The inferior vena cava is normal in size with greater than 50% respiratory variability, suggesting right atrial pressure of 3 mmHg.  Lipid Panel    Component Value Date/Time   CHOL 128 01/07/2020 1124   TRIG 107 01/07/2020 1124   HDL 50 01/07/2020 1124   CHOLHDL 2.6 01/07/2020 1124   CHOLHDL 3.2 11/05/2019 0959   VLDL 32 11/05/2019 0959   LDLCALC 58 01/07/2020 1124      Wt Readings from Last 3 Encounters:  06/02/20 183 lb (83 kg)  01/07/20 180 lb 3.2 oz (81.7 kg)  11/05/19 180 lb (81.6 kg)      ASSESSMENT AND  PLAN:  1  PAF   Pt currently in atrial fibrilliation   AGain, he does not sense   COncerning is hx of falls   He admits to being unsteady     WIll review with C Quentin Ore re Watchman device  Keep on Eliquis for now.    I have seen Derrick White in the office today.  He was referred by Haywood Pao, MD for  continued care and  for consideration for Left Atrial Appendage Closure with the Watchman Device for management of stroke risk. Based upon past history, it has been determined that he is a poor candidate for long-term oral anticoagulation.  However, he may be tolerant of short-term treatment with an anticoagulant as necessary.  A shared decision has been made utilizing the [employer] of Cardiology shared decision tool to undergo Left Atrial Appendage Closure with Watchman device at this time.     2 CVA  In June 2021 had  L temporal infarct   Most like due to afib     Keep on anticoag for now  3  Cerebellar ataxia   Will refer for more PT, esp given curr use of anticoagulant  4  HL LDL 58  HDL 50   Keep on statin    5   HTN  BP is well controlled    Current medicines are reviewed at length with the patient today.  The patient does not have concerns regarding medicines.  Signed, Dorris Carnes, MD  06/02/2020 4:33 PM    [place], [place], [place]  [address] Phone: [phone]; Fax: [phone]

## 2020-06-02 NOTE — Patient Instructions (Signed)
Medication Instructions:  No changes *If you need a refill on your cardiac medications before your next appointment, please call your pharmacy*   Lab Work: none  Testing/Procedures: none   Follow-Up: We will contact you regarding next steps in follow up.  Other Instructions

## 2020-06-05 ENCOUNTER — Ambulatory Visit: Payer: Medicare Other | Admitting: Internal Medicine

## 2020-06-07 DIAGNOSIS — H9202 Otalgia, left ear: Secondary | ICD-10-CM | POA: Diagnosis not present

## 2020-06-14 ENCOUNTER — Other Ambulatory Visit: Payer: Self-pay | Admitting: *Deleted

## 2020-06-14 DIAGNOSIS — G119 Hereditary ataxia, unspecified: Secondary | ICD-10-CM

## 2020-06-14 DIAGNOSIS — R296 Repeated falls: Secondary | ICD-10-CM

## 2020-06-14 NOTE — Progress Notes (Signed)
Please refer pt back to PT  Hx cerebellar ataxia and falls  On anticoagulation

## 2020-06-14 NOTE — Progress Notes (Signed)
Order placed for balance rehab/mw

## 2020-06-19 ENCOUNTER — Encounter: Payer: Self-pay | Admitting: Neurology

## 2020-06-19 ENCOUNTER — Other Ambulatory Visit: Payer: Self-pay

## 2020-06-19 ENCOUNTER — Encounter: Payer: Self-pay | Admitting: Cardiology

## 2020-06-19 ENCOUNTER — Ambulatory Visit (INDEPENDENT_AMBULATORY_CARE_PROVIDER_SITE_OTHER): Payer: Medicare Other | Admitting: Neurology

## 2020-06-19 ENCOUNTER — Ambulatory Visit (INDEPENDENT_AMBULATORY_CARE_PROVIDER_SITE_OTHER): Payer: Medicare Other | Admitting: Cardiology

## 2020-06-19 VITALS — BP 119/78 | HR 66 | Ht 70.0 in | Wt 183.0 lb

## 2020-06-19 VITALS — BP 108/56 | HR 62 | Ht 70.0 in | Wt 180.0 lb

## 2020-06-19 DIAGNOSIS — R269 Unspecified abnormalities of gait and mobility: Secondary | ICD-10-CM | POA: Diagnosis not present

## 2020-06-19 DIAGNOSIS — I480 Paroxysmal atrial fibrillation: Secondary | ICD-10-CM | POA: Insufficient documentation

## 2020-06-19 DIAGNOSIS — R4781 Slurred speech: Secondary | ICD-10-CM | POA: Diagnosis not present

## 2020-06-19 DIAGNOSIS — G119 Hereditary ataxia, unspecified: Secondary | ICD-10-CM | POA: Diagnosis not present

## 2020-06-19 DIAGNOSIS — I63429 Cerebral infarction due to embolism of unspecified anterior cerebral artery: Secondary | ICD-10-CM | POA: Diagnosis not present

## 2020-06-19 DIAGNOSIS — M79672 Pain in left foot: Secondary | ICD-10-CM

## 2020-06-19 DIAGNOSIS — G5732 Lesion of lateral popliteal nerve, left lower limb: Secondary | ICD-10-CM

## 2020-06-19 NOTE — Progress Notes (Signed)
Electrophysiology Office Note:    Date:  06/19/2020   ID:  Derrick White, DOB [birth year], MRN [id number]  PCP:  Haywood Pao, MD  [facility] Cardiologist:  Dorris Carnes, MD  [facility] Electrophysiologist:  Vickie Epley, MD   Referring MD: Haywood Pao, MD   Chief Complaint: Atrial fibrillation and history of falls  History of Present Illness:    Derrick White is a 80 y.o. male who presents for an evaluation of paroxysmal atrial fibrillation and history of fall at the request of Dr. Harrington Challenger. Their medical history includes TIA, prior stroke, cerebellar ataxia.  His CVA was in June 2021.  He woke with garbled speech.  Work-up revealed a posterior left temporal infarct.  He was given an event monitor which showed paroxysmal atrial fibrillation that was asymptomatic.  He is maintained on Eliquis for secondary prevention of stroke.  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  He has had a fall recently on a patio.  He is interested in ongoing stroke prophylaxis without the use of long-term anticoagulant and thus presents to discuss the watchman device.  Past Medical History:  Diagnosis Date  . C6 radiculopathy 05/04/2019  . Cerebellar ataxia ([place])   . Dysesthesia 06/04/2016  . Essential hypertension 11/05/2019  . Foot pain, 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  . Gait disturbance 03/16/2019  . History of cervical spinal surgery 06/04/2016  . History of hiatal hernia   . History of kidney stones   . Hypertension   . 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 Memory loss 03/16/2019  . 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  . Pain due to onychomycosis of toenails of both feet 04/28/2019  . Pain in left ankle and joints of left foot 08/06/2018  . Slurred speech 03/16/2019  . 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 TIA (transient ischemic attack) 11/05/2019  . Worsened handwriting 03/16/2019    Past Surgical History:  Procedure Laterality Date  .  APPENDECTOMY     1940s  . [place]  . FRACTURE SURGERY     Left ankle, sx x4  . INGUINAL HERNIA REPAIR Right 03/05/2019   Procedure: OPEN RIGHT INGUINAL HERNIA REPAIR WITH MESH;  Surgeon: Clovis Riley, MD;  Location: [facility];  Service: General;  Laterality: Right;  . KNEE SURGERY Left 1985  . SHOULDER SURGERY Left   . TONSILLECTOMY     1945    Current Medications: Current Meds  Medication Sig  . acetaminophen (TYLENOL) 500 MG tablet Take 500 mg by mouth every 6 (six) hours as needed for moderate pain or headache.  Marland Kitchen apixaban (ELIQUIS) 5 MG TABS tablet Take 1 tablet (5 mg total) by mouth 2 (two) times daily.  Marland Kitchen atorvastatin (LIPITOR) 40 MG tablet Take 1 tablet (40 mg total) by mouth daily.  . Cholecalciferol 25 MCG (1000 UT) tablet Take 1,000 Units by mouth daily.   . clotrimazole (LOTRIMIN) 1 % cream 1 application to affected area  . cyanocobalamin 1000 MCG tablet Take 1,000 mcg by mouth daily.   Marland Kitchen ibuprofen (ADVIL) 200 MG tablet Take 200 mg by mouth every 6 (six) hours as needed for headache or moderate pain.  . NON FORMULARY Triflex medication for joints  . NON FORMULARY Legatrin medication OTC Restless leg  . oxybutynin (DITROPAN-XL) 10 MG 24 hr tablet Take 10 mg by mouth daily.  . pantoprazole (PROTONIX) 40 MG tablet Take 40 mg by mouth daily.   . pravastatin (  PRAVACHOL) 40 MG tablet 1 tablet  . Probiotic Product (ALIGN PO) Take by mouth as directed.  Marland Kitchen Propylene Glycol (SYSTANE BALANCE) 0.6 % SOLN Place 1 drop into both eyes daily as needed (burning).  Marland Kitchen telmisartan (MICARDIS) 80 MG tablet Take 80 mg by mouth daily.      Allergies:   Morphine, Codeine, Propoxyphene, and Shrimp [shellfish allergy]   Social History   Socioeconomic History  . Marital status: Married    Spouse name: Not on file  . Number of children: Not on file  . Years of education: Not on file  . Highest education level: Not on file  Occupational History  . Occupation: retired   Tobacco Use  . Smoking status: Former Smoker    Packs/day: 1.00    Years: 30.00    Pack years: 30.00    Types: Cigarettes    Quit date: 1995    Years since quitting: 27.1  . Smokeless tobacco: Never Used  Vaping Use  . Vaping Use: Never used  Substance and Sexual Activity  . Alcohol use: Not Currently    Alcohol/week: 14.0 standard drinks    Types: 14 Cans of beer per week    Comment: 2 beers a day  . Drug use: Never  . Sexual activity: Not on file  Other Topics Concern  . Not on file  Social History Narrative  . Not on file   Social Determinants of Health   Financial Resource Strain: Not on file  Food Insecurity: Not on file  Transportation Needs: Not on file  Physical Activity: Not on file  Stress: Not on file  Social Connections: Not on file     Family History: The patient's family history is negative for Stroke.  ROS:   Please see the history of present illness.    All other systems reviewed and are negative.  EKGs/Labs/Other Studies Reviewed:    The following studies were reviewed today:  November 05, 2019 echo personally reviewed Left ventricular function normal, 60% Right ventricular function normal No significant valvular abnormalities Normal left atrial size  February 21, 2020 preventives monitor personally reviewed 23% A. fib burden Ventricular rates during atrial fibrillation are 66 to 212 bpm with average ventricular rate of 94 bpm No pauses Review of the ECG data also shows strips concerning for atrial flutter  EKG:  The ekg ordered today demonstrates sinus rhythm with a ventricular of 62 bpm  Recent Labs: 11/05/2019: ALT 20; BUN 20; Creatinine, Ser 1.10; Hemoglobin 14.3; Platelets 372; Potassium 4.1; Sodium 141; TSH 2.024  Recent Lipid Panel    Component Value Date/Time   CHOL 128 01/07/2020 1124   TRIG 107 01/07/2020 1124   HDL 50 01/07/2020 1124   CHOLHDL 2.6 01/07/2020 1124   CHOLHDL 3.2 11/05/2019 0959   VLDL 32 11/05/2019 0959    LDLCALC 58 01/07/2020 1124    Physical Exam:    VS:  BP (!) 108/56   Pulse 62   Ht 5\' 10"  (1.778 m)   Wt 180 lb (81.6 kg)   SpO2 97%   BMI 25.83 kg/m     Wt Readings from Last 3 Encounters:  06/19/20 180 lb (81.6 kg)  06/19/20 183 lb (83 kg)  06/02/20 183 lb (83 kg)     GEN:  Well nourished, well developed in no acute distress HEENT: Normal NECK: No JVD; No carotid bruits LYMPHATICS: No lymphadenopathy CARDIAC: RRR, no murmurs, rubs, gallops RESPIRATORY:  Clear to auscultation without rales, wheezing or rhonchi  ABDOMEN:  Soft, non-tender, non-distended MUSCULOSKELETAL:  No edema; No deformity  SKIN: Warm and dry NEUROLOGIC:  Alert and oriented x 3.  Dysarthria. PSYCHIATRIC:  Normal affect   ASSESSMENT:    1. Cerebellar ataxia (HCC)   2. Paroxysmal A-fib ([place])   3. 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 Paroxysmal atrial fibrillation Asymptomatic.  Burden approximately 20%.  Maintained on Eliquis right now.  Patient is concerned about a history of fall associated with his cerebellar ataxia.  He would like to explore stroke prophylaxis without the use of an anticoagulant long-term given the likelihood that he could have recurrent fall episodes.  We discussed the watchman implant during today's visit including the risks, expected recovery time and need for short-term anticoagulation.  His wife was with him during the visit today.  I think he would be an excellent candidate for the procedure given his history of stroke and likelihood of recurrent fall in the future.  I have seen Derrick White in the office today who is being considered for a Watchman left atrial appendage closure device. I believe they will benefit from this procedure given their history of atrial fibrillation, CHA2DS2-VASc score of 5 and unadjusted ischemic stroke rate of  7.2% per year. Unfortunately, the patient is not felt to be a long term anticoagulation candidate secondary to cerebellar ataxia and history of fall no. The patient's chart has been reviewed and I feel that they would be a candidate for short term oral anticoagulation after Watchman implant.   It is my belief that after undergoing a LAA closure procedure, Derrick White will not need long term anticoagulation which eliminates anticoagulation side effects and major bleeding risk.   Procedural risks for the Watchman implant have been reviewed with the patient including a 0.5% risk of stroke, <1% risk of perforation and <1% risk of device embolization.    The published clinical data on the safety and effectiveness of WATCHMAN include but are not limited to the following: - Holmes DR, Mechele Claude, Sick P et al. for the PROTECT AF Investigators. Percutaneous closure of the left atrial appendage versus warfarin therapy for prevention of stroke in patients with atrial fibrillation: a randomised non-inferiority trial. Lancet 2009; 374: 534-42. Mechele Claude, Doshi SK, Abelardo Diesel D et al. on behalf of the PROTECT AF Investigators. Percutaneous Left Atrial Appendage Closure for Stroke Prophylaxis in Patients With Atrial Fibrillation 2.3-Year Follow-up of the PROTECT AF (Watchman Left Atrial Appendage System for Embolic Protection in Patients With Atrial Fibrillation) Trial. Circulation 2013; 127:720-729. - Alli O, Doshi S,  Kar S, Reddy VY, Sievert H et al. Quality of Life Assessment in the Randomized PROTECT AF (Percutaneous Closure of the Left Atrial Appendage Versus Warfarin Therapy for Prevention of Stroke in Patients With Atrial Fibrillation) Trial of Patients at Risk for Stroke With Nonvalvular Atrial Fibrillation. J Am Coll Cardiol 2013; 41:3244-0. - [place], Tarri Abernethy, Price M, Rentchler, Sievert H, Doshi S, Huber K, Reddy V. Prospective randomized evaluation of the Watchman left atrial appendage  Device in patients with atrial fibrillation versus long-term warfarin therapy; the PREVAIL trial. Journal of the [employer] of Cardiology, Vol. 4, No. 1, 2014, 1-11. - Kar S, Doshi SK, Sadhu A, Horton R, Osorio J et al. Primary outcome evaluation of a next-generation left atrial appendage closure device: results from the PINNACLE FLX trial.  Circulation 2021;143(18)1754-1762.    After today's visit with the patient which was dedicated solely for shared decision making visit regarding LAA closure device, the patient decided to proceed with the LAA appendage closure procedure scheduled to be done in the near future at [facility]. Prior to the procedure, I would like to obtain a gated CT scan of the chest with contrast timed for PV/LA visualization.       Medication Adjustments/Labs and Tests Ordered: Current medicines are reviewed at length with the patient today.  Concerns regarding medicines are outlined above.  Orders Placed This Encounter  Procedures  . EKG 12-Lead   No orders of the defined types were placed in this encounter.    Signed, Lars Mage, MD, [facility]  06/19/2020 3:36 PM    Electrophysiology [facility]

## 2020-06-19 NOTE — Progress Notes (Signed)
[facility]  PATIENT: Derrick White DOB: January 29, 1941  REFERRING DOCTOR OR PCP:  Domenick Gong SOURCE: patient, records in EPIC from [facility], MRI results, EMG/NCV results  _________________________________   HISTORICAL  CHIEF COMPLAINT:  Chief Complaint  Patient presents with  . Follow-up    RM 12 with wife. Last seen 05/04/19 for EMG/NCS and 03/16/19 for OV. Ambulates with cane. Has had 1 fall in June 2021. Denies any significant injuries. Denies any new sx. No concerns at this time.     HISTORY OF PRESENT ILLNESS:  Derrick White is a 80 yo man with left ankle and foot pain.    Update 06/19/2020: He went to [facility] for a second opinion and was diagnosed with Dr. Buck Mam.  Genetic testing was discussed but he initially opted not to proceed due to the cost.  However, after giving more thought that knowledge of a genetic disease might be useful for his children and grandchildren, he would like to proceed with testing.  He does not think he needs to follow-up with [facility].  He will run by the office later this afternoon with the paperwork that he received for the genetic testing and then I will transcribe these into fresh orders.    His slurred speech continues to slowly worsen.  He occasionally has some mild difficulties with swallowing.Marland Kitchen  He is beginning to notice more difficulties with his balance.  This has progressed especially over the past year.    NCV/EMG study did not show any evidence of motor neuron disease.  One facial muscle had smaller than typical motor units but recruitment was normal and 2 other facial muscles appear normal.  He does not have any difficulties with diplopia or ptosis.  MRI did not show any source of these bulbar symptoms.  The extent of atrophy in the cerebellum was minimal.  He used to drink some but no more than 1-2 drinks a day and not every day of the week.  He has not been exposed to Dilantin or  antineoplastic agents.  He reports mild foot pain but it is improved.    He has had hemilaminectomy at L5-S1 and also broke his left ankle in the past with a nerve injury.  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   History:   He broke his left ankle in 2012 or 2013 requiring plate and screws.   He later had the hardware removed without benefit.    A year leater, he had surgery to decompress the peroneal nerve.    He was referred to [facility] and had the left peroneal neurectomy and removal of  L3L4 Morton's Neuroma and L2L3 intermetatarsal nerve decompression procedures.    He was tried on Lyrica but felt very off balanced and mentally slowed so he stopped.  He had mild benefit from lidocaine containing ointments. A couple different types of ointments were tried and the composition of all of them is unknown.     MRI of the brain 11/05/2019 showed a very small acute stroke in the left temporal operculum.  There was moderate chronic microvascular ischemic change.  MR angiogram that day was normal.  DATA:  NCV/EMG study 05/04/2019 showed the following: 1.   Chronic right C6 radiculopathy.  There could also be milder C5 and C7 chronic radiculopathies. 2.   Chronic left S1 radiculopathy. 3.   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   There was no evidence of polyneuropathy, motor neuron disease or myopathy.  Laboratory data  05/04/2019: CK, acetylcholine receptor antibodies, anti-GM 1 IgG were normal or negative. 09/16/2019: Vitamin B12, ANA, vitamin B1, antigad, RPR were negative or normal.  Celiac panel was borderline.  REVIEW OF SYSTEMS: Constitutional: No fevers, chills, sweats, or change in appetite Eyes: No visual changes, double vision, eye pain Ear, nose and throat: No hearing loss, ear pain, nasal congestion, sore throat Cardiovascular: No chest pain,  palpitations Respiratory: No shortness of breath at rest or with exertion.   No wheezes GastrointestinaI: No nausea, vomiting, diarrhea, abdominal pain, fecal incontinence.   Has GERD Genitourinary: No dysuria, urinary retention or frequency.  No nocturia. Musculoskeletal: No current neck pain, back pain Integumentary: No rash, pruritus, skin lesions Neurological: as above Psychiatric: No depression at this time.  No anxiety Endocrine: No palpitations, diaphoresis, change in appetite, change in weigh or increased thirst Hematologic/Lymphatic: No anemia, purpura, petechiae. Allergic/Immunologic: No itchy/runny eyes, nasal congestion, recent allergic reactions, rashes  ALLERGIES: Allergies  Allergen Reactions  . Morphine Hives and Itching  . Codeine Hives and Itching  . Propoxyphene Itching    DARVOCET  . Shrimp [Shellfish Allergy] Rash    HOME MEDICATIONS:  Current Outpatient Medications:  .  acetaminophen (TYLENOL) 500 MG tablet, Take 500 mg by mouth every 6 (six) hours as needed for moderate pain or headache., Disp: , Rfl:  .  apixaban (ELIQUIS) 5 MG TABS tablet, Take 1 tablet (5 mg total) by mouth 2 (two) times daily., Disp: 180 tablet, Rfl: 1 .  atorvastatin (LIPITOR) 40 MG tablet, Take 1 tablet (40 mg total) by mouth daily., Disp: 90 tablet, Rfl: 3 .  Cholecalciferol 25 MCG (1000 UT) tablet, Take 1,000 Units by mouth daily. , Disp: , Rfl:  .  clotrimazole (LOTRIMIN) 1 % cream, 1 application to affected area, Disp: , Rfl:  .  cyanocobalamin 1000 MCG tablet, Take 1,000 mcg by mouth daily. , Disp: , Rfl:  .  ibuprofen (ADVIL) 200 MG tablet, Take 200 mg by mouth every 6 (six) hours as needed for headache or moderate pain., Disp: , Rfl:  .  NON FORMULARY, Triflex medication for joints, Disp: , Rfl:  .  NON FORMULARY, Legatrin medication OTC Restless leg, Disp: , Rfl:  .  oxybutynin (DITROPAN-XL) 10 MG 24 hr tablet, Take 10 mg by mouth daily., Disp: , Rfl:  .  pantoprazole  (PROTONIX) 40 MG tablet, Take 40 mg by mouth daily. , Disp: , Rfl: 2 .  pravastatin (PRAVACHOL) 40 MG tablet, 1 tablet, Disp: , Rfl:  .  Probiotic Product (ALIGN PO), Take by mouth as directed., Disp: , Rfl:  .  Propylene Glycol (SYSTANE BALANCE) 0.6 % SOLN, Place 1 drop into both eyes daily as needed (burning)., Disp: , Rfl:  .  telmisartan (MICARDIS) 80 MG tablet, Take 80 mg by mouth daily. , Disp: , Rfl:   PAST MEDICAL HISTORY: Past Medical History:  Diagnosis Date  . C6 radiculopathy 05/04/2019  . Cerebellar ataxia ([place])   . Dysesthesia 06/04/2016  . Essential hypertension 11/05/2019  . Foot pain, 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  . Gait disturbance 03/16/2019  . History of cervical spinal surgery 06/04/2016  . History of hiatal hernia   . History of kidney stones   . Hypertension   . 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 Memory loss 03/16/2019  . 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  .  Pain due to onychomycosis of toenails of both feet 04/28/2019  . Pain in left ankle and joints of left foot 08/06/2018  . Slurred speech 03/16/2019  . 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 TIA (transient ischemic attack) 11/05/2019  . Worsened handwriting 03/16/2019    PAST SURGICAL HISTORY: See above  FAMILY HISTORY: Family History  Problem Relation Age of Onset  . Stroke Neg Hx     SOCIAL HISTORY:  Social History   Socioeconomic History  . Marital status: Married    Spouse name: Not on file  . Number of children: Not on file  . Years of education: Not on file  . Highest education level: Not on file  Occupational History  . Occupation: retired  Tobacco Use  . Smoking status: Former Smoker    Packs/day: 1.00    Years: 30.00    Pack years: 30.00    Types: Cigarettes    Quit date: 1995    Years since quitting: 27.1  . Smokeless tobacco: Never Used  Vaping Use  . Vaping Use: Never used  Substance and Sexual Activity  . Alcohol use: Not Currently    Alcohol/week: 14.0  standard drinks    Types: 14 Cans of beer per week    Comment: 2 beers a day  . Drug use: Never  . Sexual activity: Not on file  Other Topics Concern  . Not on file  Social History Narrative  . Not on file   Social Determinants of Health   Financial Resource Strain: Not on file  Food Insecurity: Not on file  Transportation Needs: Not on file  Physical Activity: Not on file  Stress: Not on file  Social Connections: Not on file  Intimate Partner Violence: Not on file     PHYSICAL EXAM  Vitals:   06/19/20 1034  BP: 119/78  Pulse: 66  SpO2: 97%  Weight: 183 lb (83 kg)  Height: 5\' 10"  (1.778 m)    Body mass index is 26.26 kg/m.   General: The patient is well-developed and well-nourished and in no acute distress    Skin/Ext: Extremities are without rash or edema.   He has well-healed surgical scars on the left foot.  These are near the sural nerve.    Neurologic Exam  Mental status: He appears to have reduced focus and attention.  Has no trouble following commands.   No aphasia  Cranial nerves: Extraocular movements are full.  Facial strength is normal.  His voice is slurred/ataxic.   Palatal elevation and tongue protrusion are midline.   No obvious hearing deficits are noted.  Motor:  Muscle bulk is normal.   Tone is normal. Strength is  5/5 in legs/feet including EHL   Sensory:   He has mildly reduced sensation in the left foot, in the distribution of the left superficial peroneal nerve.  Mildly reduced vibration sensation in ankles and toes.   Coordination: Cerebellar testing reveals mildly reduced  finger-nose-finger and heel-to-shin bilaterally.  Gait and station: Station is normal.   Gait is ataxic and wide but he turns in 4 steps.    Romberg is negative.   Reflexes: Deep tendon reflexes are symmetric and 3 at knees and 1 at ankles.       DIAGNOSTIC DATA (LABS, IMAGING, TESTING) - I reviewed patient records, labs, notes, testing and imaging myself where  available.  Lab Results  Component Value Date   WBC 6.7 11/05/2019   HGB 14.3 11/05/2019   HCT 42.0  11/05/2019   MCV 98.0 11/05/2019   PLT 372 11/05/2019      Component Value Date/Time   NA 141 11/05/2019 1013   K 4.1 11/05/2019 1013   CL 105 11/05/2019 1013   CO2 22 11/05/2019 0959   GLUCOSE 99 11/05/2019 1013   BUN 20 11/05/2019 1013   CREATININE 1.10 11/05/2019 1013   CALCIUM 9.2 11/05/2019 0959   PROT 6.9 11/05/2019 0959   ALBUMIN 4.1 11/05/2019 0959   AST 18 11/05/2019 0959   ALT 20 11/05/2019 0959   ALKPHOS 27 (L) 11/05/2019 0959   BILITOT 1.1 11/05/2019 0959   GFRNONAA 59 (L) 11/05/2019 0959   GFRAA >60 11/05/2019 0959       ASSESSMENT AND PLAN  Cerebellar ataxia ([place]) - Plan: Ambulatory referral to Physical Therapy  Slurred speech  Gait disturbance  Superficial peroneal nerve neuropathy, left  Foot pain, left   1.   He was diagnosed with cerebellar ataxia (but not a specific SCA or other).   He would like to proceed with genetic testing.   He was given a list by Dr. Buck Mam at [facility].  He will bring this by the office and we can transcribe the orders for [facility] or other lab. 2.   Stay active and exercise as tolerated.   3.     He will return to see me in 6 months or sooner if there are new or worsening neurologic symptoms.Marland Kitchen He should call if he has worsening pain or other new symptoms.   Naava Janeway A. Felecia Shelling, MD, PhD [id number], [id number] PM Certified in Neurology, Clinical Neurophysiology, Sleep Medicine, Pain Medicine and Neuroimaging  [facility] [address] [place], [place] [address] [phone]

## 2020-06-19 NOTE — Patient Instructions (Signed)
Medication Instructions:  Your physician recommends that you continue on your current medications as directed. Please refer to the Current Medication list given to you today.  *If you need a refill on your cardiac medications before your next appointment, please call your pharmacy*   Lab Work: None ordered. Today  If you have labs (blood work) drawn today and your tests are completely normal, you will receive your results only by: Marland Kitchen MyChart Message (if you have MyChart) OR . A paper copy in the mail If you have any lab test that is abnormal or we need to change your treatment, we will call you to review the results.   Testing/Procedures: Dr Quentin Ore recommends that you be scheduled for a Watchman.  You will be contacted for scheduling.   Follow-Up: At [facility], you and your health needs are our priority.  As part of our continuing mission to provide you with exceptional heart care, we have created designated Provider Care Teams.  These Care Teams include your primary Cardiologist (physician) and Advanced Practice Providers (APPs -  Physician Assistants and Nurse Practitioners) who all work together to provide you with the care you need, when you need it.  We recommend signing up for the patient portal called "MyChart".  Sign up information is provided on this After Visit Summary.  MyChart is used to connect with patients for Virtual Visits (Telemedicine).  Patients are able to view lab/test results, encounter notes, upcoming appointments, etc.  Non-urgent messages can be sent to your provider as well.   To learn more about what you can do with MyChart, go to [email or web address].    Your next appointment:   To be scheduled

## 2020-06-20 ENCOUNTER — Telehealth: Payer: Self-pay | Admitting: *Deleted

## 2020-06-20 ENCOUNTER — Telehealth: Payer: Self-pay

## 2020-06-20 DIAGNOSIS — G119 Hereditary ataxia, unspecified: Secondary | ICD-10-CM

## 2020-06-20 DIAGNOSIS — I480 Paroxysmal atrial fibrillation: Secondary | ICD-10-CM

## 2020-06-20 NOTE — Telephone Encounter (Signed)
-----   Message from Thora Lance, RN sent at 06/19/2020  3:29 PM EST ----- Regarding: Watchman Pt will be out of town until 07/09/2020.  If you need to contact plz call [phone]  Thank You,  Rosann Auerbach

## 2020-06-20 NOTE — Telephone Encounter (Signed)
Outreach made to Pt.  Will plan to meet with Pt and wife on 2/23 to get BMP and go over cardiac CT instructions and discuss Watchman work up.

## 2020-06-20 NOTE — Telephone Encounter (Signed)
Called pt/wife. He will come by today to sign [employer] lab order form.  Dr. Felecia Shelling ordering test code: [phone] (Ataxia, Comprehensive Evaluation)

## 2020-06-20 NOTE — Telephone Encounter (Signed)
Pt came by and signed form.  Faxed completed/signed order form to [place] at [phone]. Received fax confirmation.

## 2020-06-22 NOTE — Telephone Encounter (Signed)
[employer] and spoke with [place]. He confirmed they received our order form. They have processed it, letter to go out to pt today. They also are going to call pt to go over financial assistance. Nothing further needed at this time.

## 2020-06-27 NOTE — Telephone Encounter (Signed)
[employer] at [phone]. Spoke with [place]. Collection kit sent out to pt on 06/26/20.

## 2020-07-05 MED ORDER — METOPROLOL TARTRATE 50 MG PO TABS: ORAL_TABLET | ORAL | 0 refills | Status: DC

## 2020-07-05 NOTE — Addendum Note (Signed)
Addended by: Willeen Cass A on: 07/05/2020 04:11 PM   Modules accepted: Orders

## 2020-07-10 DIAGNOSIS — G119 Hereditary ataxia, unspecified: Secondary | ICD-10-CM | POA: Diagnosis not present

## 2020-07-10 DIAGNOSIS — H9202 Otalgia, left ear: Secondary | ICD-10-CM | POA: Diagnosis not present

## 2020-07-10 DIAGNOSIS — I4891 Unspecified atrial fibrillation: Secondary | ICD-10-CM | POA: Diagnosis not present

## 2020-07-12 ENCOUNTER — Other Ambulatory Visit: Payer: Self-pay

## 2020-07-12 ENCOUNTER — Other Ambulatory Visit: Payer: Medicare Other | Admitting: *Deleted

## 2020-07-12 ENCOUNTER — Telehealth (HOSPITAL_COMMUNITY): Payer: Self-pay | Admitting: Emergency Medicine

## 2020-07-12 DIAGNOSIS — G119 Hereditary ataxia, unspecified: Secondary | ICD-10-CM | POA: Diagnosis not present

## 2020-07-12 DIAGNOSIS — I480 Paroxysmal atrial fibrillation: Secondary | ICD-10-CM

## 2020-07-12 NOTE — Telephone Encounter (Signed)
Reaching out to patient to offer assistance regarding upcoming cardiac imaging study; pt verbalizes understanding of appt date/time, parking situation and where to check in, pre-test NPO status and medications ordered, and verified current allergies; name and call back number provided for further questions should they arise Meri Pelot RN Navigator Cardiac Imaging [facility] [phone] office [phone] cell 

## 2020-07-13 DIAGNOSIS — N3941 Urge incontinence: Secondary | ICD-10-CM | POA: Diagnosis not present

## 2020-07-13 LAB — BASIC METABOLIC PANEL
BUN/Creatinine Ratio: 20 (ref 10–24)
BUN: 20 mg/dL (ref 8–27)
CO2: 21 mmol/L (ref 20–29)
Calcium: 9.7 mg/dL (ref 8.6–10.2)
Chloride: 101 mmol/L (ref 96–106)
Creatinine, Ser: 1.01 mg/dL (ref 0.76–1.27)
GFR calc Af Amer: 81 mL/min/{1.73_m2} (ref >59)
GFR calc non Af Amer: 70 mL/min/{1.73_m2} (ref >59)
Glucose: 124 mg/dL — ABNORMAL HIGH (ref 65–99)
Potassium: 4.8 mmol/L (ref 3.5–5.2)
Sodium: 141 mmol/L (ref 134–144)

## 2020-07-13 NOTE — Telephone Encounter (Signed)
Called [employer] to get update. Spoke with [employer]. Collection kit received by pt 06/28/20. They have not received sample back yet from pt. He will follow up with pt tomorrow.

## 2020-07-14 ENCOUNTER — Ambulatory Visit (HOSPITAL_COMMUNITY)
Admission: RE | Admit: 2020-07-14 | Discharge: 2020-07-14 | Disposition: A | Discharge status: Home / Self Care | Payer: Medicare Other | Source: Ambulatory Visit | Attending: Cardiology | Admitting: Cardiology

## 2020-07-14 ENCOUNTER — Other Ambulatory Visit: Payer: Self-pay

## 2020-07-14 ENCOUNTER — Other Ambulatory Visit (HOSPITAL_COMMUNITY): Payer: Medicare Other

## 2020-07-14 DIAGNOSIS — G119 Hereditary ataxia, unspecified: Secondary | ICD-10-CM | POA: Diagnosis not present

## 2020-07-14 DIAGNOSIS — I480 Paroxysmal atrial fibrillation: Secondary | ICD-10-CM | POA: Diagnosis not present

## 2020-07-14 MED ORDER — IOHEXOL 350 MG/ML SOLN
80.0000 mL | Freq: Once | INTRAVENOUS | Status: AC | PRN
  Administered 2020-07-14: 80 mL via INTRAVENOUS

## 2020-07-17 ENCOUNTER — Telehealth: Payer: Self-pay | Admitting: Nurse Practitioner

## 2020-07-17 ENCOUNTER — Telehealth (HOSPITAL_COMMUNITY): Payer: Self-pay | Admitting: Physician Assistant

## 2020-07-17 NOTE — Telephone Encounter (Signed)
Call placed this morning to Mr. Schliep regarding his interest in proceeding with the Watchman implant. I advised that our next available implant date would be 3/10 and he was fine with that date. We discussed the next steps in the work up process and he expressed a full understanding and is eager to proceed at this point. I instructed him to call me with any future question and provided my contact information.   [name], RN  Watchman Coordinator [phone]

## 2020-07-17 NOTE — Telephone Encounter (Signed)
Called and left message for patient to call back.  Patient needs pre-procedure Watchman appt with Adline Peals, PA on 07/20/20 per [name], RN.

## 2020-07-18 NOTE — Telephone Encounter (Signed)
Patient returned my message and is aware of appt 07/20/2020 at 9 am with Adline Peals, PA.

## 2020-07-18 NOTE — Telephone Encounter (Signed)
Called and left another 2nd VM for patient to call back to schedule appt.  Also called pt's mobile and spoke with spouse.  Spouse said pt was driving and they would be home shortly and she would have pt call back to schedule appt.

## 2020-07-19 ENCOUNTER — Ambulatory Visit (HOSPITAL_COMMUNITY): Admission: RE | Admit: 2020-07-19 | Payer: Medicare Other | Source: Ambulatory Visit

## 2020-07-19 NOTE — H&P (View-Only) (Signed)
Primary Care Physician: Haywood Pao, MD Primary Cardiologist: Dr Harrington Challenger Primary Electrophysiologist: Dr Quentin Ore Referring Physician: Dr Quentin Ore   Derrick White is a 80 y.o. male with a history of prior CVA, cerebellar ataxia, and atrial fibrillation who presents for follow up in the [facility]. Patient is on Eliquis for a CHADS2VASC score of 5. Patient was seen by Dr Quentin Ore for [facility] device consideration. He is not felt to be a candidate for long term anticoagulation due to his high fall risk with cerebellar ataxia. He is scheduled for Watchman implant on 07/27/20. His afib is paroxysmal and when he is in afib he is rate controlled and asymptomatic.    Today, he denies symptoms of palpitations, chest pain, shortness of breath, orthopnea, PND, lower extremity edema, dizziness, presyncope, syncope, snoring, daytime somnolence, bleeding. The patient is tolerating medications without difficulties and is otherwise without complaint today.    Atrial Fibrillation Risk Factors:  he does not have symptoms or diagnosis of sleep apnea. he does not have a history of rheumatic fever.   he has a BMI of Body mass index is 26.26 kg/m.Marland Kitchen Filed Weights   07/20/20 0904  Weight: 83 kg    Family History  Problem Relation Age of Onset  . Stroke Neg Hx      Atrial Fibrillation Management history:  Previous antiarrhythmic drugs: none Previous cardioversions: none Previous ablations: none CHADS2VASC score: 5 Anticoagulation history: Eliquis   Past Medical History:  Diagnosis Date  . C6 radiculopathy 05/04/2019  . Cerebellar ataxia ([place])   . Dysesthesia 06/04/2016  . Essential hypertension 11/05/2019  . Foot pain, 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  . Gait disturbance 03/16/2019  . History of cervical spinal surgery 06/04/2016  . History of hiatal hernia   . History of kidney stones   . Hypertension   . 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 Memory loss  03/16/2019  . 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  . Pain due to onychomycosis of toenails of both feet 04/28/2019  . Pain in left ankle and joints of left foot 08/06/2018  . Slurred speech 03/16/2019  . 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 TIA (transient ischemic attack) 11/05/2019  . Worsened handwriting 03/16/2019   Past Surgical History:  Procedure Laterality Date  . APPENDECTOMY     1940s  . [place]  . FRACTURE SURGERY     Left ankle, sx x4  . INGUINAL HERNIA REPAIR Right 03/05/2019   Procedure: OPEN RIGHT INGUINAL HERNIA REPAIR WITH MESH;  Surgeon: Clovis Riley, MD;  Location: [place];  Service: General;  Laterality: Right;  . KNEE SURGERY Left 1985  . SHOULDER SURGERY Left   . TONSILLECTOMY     1945    Current Outpatient Medications  Medication Sig Dispense Refill  . acetaminophen (TYLENOL) 500 MG tablet Take 500 mg by mouth every 6 (six) hours as needed for moderate pain or headache.    Marland Kitchen apixaban (ELIQUIS) 5 MG TABS tablet Take 1 tablet (5 mg total) by mouth 2 (two) times daily. 180 tablet 1  . atorvastatin (LIPITOR) 40 MG tablet Take 1 tablet (40 mg total) by mouth daily. 90 tablet 3  . Cholecalciferol 25 MCG (1000 UT) tablet Take 1,000 Units by mouth every evening.    . clotrimazole (LOTRIMIN) 1 % cream Apply 1 application topically 2 (two) times daily as needed (skin irritation).    . cyanocobalamin 1000 MCG tablet Take 1,000 mcg  by mouth every evening.    . fluticasone (FLONASE) 50 MCG/ACT nasal spray Place 1 spray into both nostrils daily.    . Homeopathic Products (LEG CRAMPS PM SL) Take 1 tablet by mouth at bedtime. Legatrin PM    . ibuprofen (ADVIL) 200 MG tablet Take 200 mg by mouth every 6 (six) hours as needed for headache or moderate pain.    . pantoprazole (PROTONIX) 40 MG tablet Take 40 mg by mouth daily.   2  . Probiotic Product (ALIGN PO) Take 1 capsule by mouth daily.    Marland Kitchen Propylene Glycol (SYSTANE BALANCE) 0.6 %  SOLN Place 1 drop into both eyes daily as needed (burning).    Marland Kitchen telmisartan (MICARDIS) 80 MG tablet Take 80 mg by mouth daily.      No current facility-administered medications for this encounter.    Allergies  Allergen Reactions  . Morphine Hives and Itching  . Codeine Hives and Itching  . Propoxyphene Itching    DARVOCET  . Shrimp [Shellfish Allergy] Rash    Social History   Socioeconomic History  . Marital status: Married    Spouse name: Not on file  . Number of children: Not on file  . Years of education: Not on file  . Highest education level: Not on file  Occupational History  . Occupation: retired  Tobacco Use  . Smoking status: Former Smoker    Packs/day: 1.00    Years: 30.00    Pack years: 30.00    Types: Cigarettes    Quit date: 1995    Years since quitting: 27.1  . Smokeless tobacco: Never Used  Vaping Use  . Vaping Use: Never used  Substance and Sexual Activity  . Alcohol use: Not Currently    Alcohol/week: 14.0 standard drinks    Types: 14 Cans of beer per week    Comment: 2 beers a day  . Drug use: Never  . Sexual activity: Not on file  Other Topics Concern  . Not on file  Social History Narrative  . Not on file   Social Determinants of Health   Financial Resource Strain: Not on file  Food Insecurity: Not on file  Transportation Needs: Not on file  Physical Activity: Not on file  Stress: Not on file  Social Connections: Not on file  Intimate Partner Violence: Not on file     ROS- All systems are reviewed and negative except as per the HPI above.  Physical Exam: Vitals:   07/20/20 0904  BP: 134/84  Pulse: 78  Weight: 83 kg  Height: 5\' 10"  (1.778 m)    GEN- The patient is well appearing elderly male, alert and oriented x 3 today.   Head- normocephalic, atraumatic Eyes-  Sclera clear, conjunctiva pink Ears- hearing intact Oropharynx- clear Neck- supple  Lungs- Clear to ausculation bilaterally, normal work of breathing Heart-  irregular rate and rhythm, no murmurs, rubs or gallops  GI- soft, NT, ND, + BS Extremities- no clubbing, cyanosis, or edema MS- no significant deformity or atrophy Skin- no rash or lesion Psych- euthymic mood, full affect Neuro- strength and sensation are intact  Wt Readings from Last 3 Encounters:  07/20/20 83 kg  06/19/20 81.6 kg  06/19/20 83 kg    EKG today demonstrates  Coarse afib Vent. rate 78 BPM PR interval * ms QRS duration 86 ms QT/QTc 364/414 ms  Echo 11/05/19 demonstrated  1. Left ventricular ejection fraction, by estimation, is 60 to 65%. The  left ventricle has  normal function. The left ventricle has no regional  wall motion abnormalities. Left ventricular diastolic parameters were  normal.  2. Right ventricular systolic function is normal. The right ventricular  size is normal. There is normal pulmonary artery systolic pressure.  3. The mitral valve is normal in structure. No evidence of mitral valve  regurgitation. No evidence of mitral stenosis.  4. The aortic valve is normal in structure. Aortic valve regurgitation is  not visualized. No aortic stenosis is present.  5. Aortic dilatation noted. There is borderline dilatation of the aortic  root.  6. The inferior vena cava is normal in size with greater than 50%  respiratory variability, suggesting right atrial pressure of 3 mmHg.   Cardiac CT 07/14/20 Large Wind-sock appendage with prominent lateral trabeculation with maximum landing zone diameter 23 mm Suitable for a 27 Watchman FLX device with 14.8% compression  Epic records are reviewed at length today  HAS-BLED score 3 Hypertension Yes  Abnormal renal and liver function (Dialysis, transplant, Cr >2.26 mg/dL /Cirrhosis or Bilirubin >2x Normal or AST/ALT/AP >3x Normal) No  Stroke Yes  Bleeding No  Labile INR (Unstable/high INR) No  Elderly (>65) Yes  Drugs or alcohol (? 8 drinks/week, anti-plt or NSAID) No   CHA2DS2-VASc Score = 5  The  patient's score is based upon: CHF History: No HTN History: Yes Diabetes History: No Stroke History: Yes Vascular Disease History: No Age Score: 2 Gender Score: 0      ASSESSMENT AND PLAN: 1. Paroxysmal Atrial Fibrillation (ICD10:  I48.0) The patient's CHA2DS2-VASc score is 5, indicating a 7.2% annual risk of stroke.  He is in rate controlled afib today but asymptomatic.   I have seen Derrick White is a 80 y.o. male in the office today who has been referred by Dr Harrington Challenger for a Watchman left atrial appendage closure device.  He has a history of paroxysmal atrial fibrillation.  This patient's CHA2DS2-VASc Score and unadjusted Ischemic Stroke Rate (% per year) is equal to 7.2 % stroke rate/year from a score of 5 which necessitates long term oral anticoagulation to prevent stroke. HasBled score is 3.  Unfortunately, He is not felt to be a long term anticoagulation candidate secondary to a high fall risk with cerebellar ataxia. Procedural risks for the Watchman implant have again been reviewed with the patient including a 0.5% risk of stroke, <1% risk of perforation, <1% risk of device embolization.   Continue Eliquis 5 mg BID. Hold morning of procedure. Start ASA 81 mg morning of implant Check CXR Check bmet/CBC Continue metoprolol 50 mg BID  2. 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  Continue Apixaban (Eliquis). See plans above.  3. HTN Stable, no changes today.   Follow up for Watchman implant on 07/27/20.    [facility] [address] [place], [place] [address] [phone] 07/20/2020 9:16 AM

## 2020-07-19 NOTE — Progress Notes (Signed)
Primary Care Physician: Haywood Pao, MD Primary Cardiologist: Dr Harrington Challenger Primary Electrophysiologist: Dr Quentin Ore Referring Physician: Dr Quentin Ore   Derrick White is a 80 y.o. male with a history of prior CVA, cerebellar ataxia, and atrial fibrillation who presents for follow up in the [facility]. Patient is on Eliquis for a CHADS2VASC score of 5. Patient was seen by Dr Quentin Ore for Derrick White device consideration. He is not felt to be a candidate for long term anticoagulation due to his high fall risk with cerebellar ataxia. He is scheduled for Watchman implant on 07/27/20. His afib is paroxysmal and when he is in afib he is rate controlled and asymptomatic.    Today, he denies symptoms of palpitations, chest pain, shortness of breath, orthopnea, PND, lower extremity edema, dizziness, presyncope, syncope, snoring, daytime somnolence, bleeding. The patient is tolerating medications without difficulties and is otherwise without complaint today.    Atrial Fibrillation Risk Factors:  he does not have symptoms or diagnosis of sleep apnea. he does not have a history of rheumatic fever.   he has a BMI of Body mass index is 26.26 kg/m.Marland Kitchen Filed Weights   07/20/20 0904  Weight: 83 kg    Family History  Problem Relation Age of Onset  . Stroke Neg Hx      Atrial Fibrillation Management history:  Previous antiarrhythmic drugs: none Previous cardioversions: none Previous ablations: none CHADS2VASC score: 5 Anticoagulation history: Eliquis   Past Medical History:  Diagnosis Date  . C6 radiculopathy 05/04/2019  . Cerebellar ataxia ([place])   . Dysesthesia 06/04/2016  . Essential hypertension 11/05/2019  . Foot pain, 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  . Gait disturbance 03/16/2019  . History of cervical spinal surgery 06/04/2016  . History of hiatal hernia   . History of kidney stones   . Hypertension   . 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 Memory loss  03/16/2019  . 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  . Pain due to onychomycosis of toenails of both feet 04/28/2019  . Pain in left ankle and joints of left foot 08/06/2018  . Slurred speech 03/16/2019  . 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 TIA (transient ischemic attack) 11/05/2019  . Worsened handwriting 03/16/2019   Past Surgical History:  Procedure Laterality Date  . APPENDECTOMY     1940s  . [place]  . FRACTURE SURGERY     Left ankle, sx x4  . INGUINAL HERNIA REPAIR Right 03/05/2019   Procedure: OPEN RIGHT INGUINAL HERNIA REPAIR WITH MESH;  Surgeon: Clovis Riley, MD;  Location: [facility];  Service: General;  Laterality: Right;  . KNEE SURGERY Left 1985  . SHOULDER SURGERY Left   . TONSILLECTOMY     1945    Current Outpatient Medications  Medication Sig Dispense Refill  . acetaminophen (TYLENOL) 500 MG tablet Take 500 mg by mouth every 6 (six) hours as needed for moderate pain or headache.    Marland Kitchen apixaban (ELIQUIS) 5 MG TABS tablet Take 1 tablet (5 mg total) by mouth 2 (two) times daily. 180 tablet 1  . atorvastatin (LIPITOR) 40 MG tablet Take 1 tablet (40 mg total) by mouth daily. 90 tablet 3  . Cholecalciferol 25 MCG (1000 UT) tablet Take 1,000 Units by mouth every evening.    . clotrimazole (LOTRIMIN) 1 % cream Apply 1 application topically 2 (two) times daily as needed (skin irritation).    . cyanocobalamin 1000 MCG tablet Take 1,000 mcg  by mouth every evening.    . fluticasone (FLONASE) 50 MCG/ACT nasal spray Place 1 spray into both nostrils daily.    . Homeopathic Products (LEG CRAMPS PM SL) Take 1 tablet by mouth at bedtime. Legatrin PM    . ibuprofen (ADVIL) 200 MG tablet Take 200 mg by mouth every 6 (six) hours as needed for headache or moderate pain.    . pantoprazole (PROTONIX) 40 MG tablet Take 40 mg by mouth daily.   2  . Probiotic Product (ALIGN PO) Take 1 capsule by mouth daily.    Marland Kitchen Propylene Glycol (SYSTANE BALANCE) 0.6 %  SOLN Place 1 drop into both eyes daily as needed (burning).    Marland Kitchen telmisartan (MICARDIS) 80 MG tablet Take 80 mg by mouth daily.      No current facility-administered medications for this encounter.    Allergies  Allergen Reactions  . Morphine Hives and Itching  . Codeine Hives and Itching  . Propoxyphene Itching    DARVOCET  . Shrimp [Shellfish Allergy] Rash    Social History   Socioeconomic History  . Marital status: Married    Spouse name: Not on file  . Number of children: Not on file  . Years of education: Not on file  . Highest education level: Not on file  Occupational History  . Occupation: retired  Tobacco Use  . Smoking status: Former Smoker    Packs/day: 1.00    Years: 30.00    Pack years: 30.00    Types: Cigarettes    Quit date: 1995    Years since quitting: 27.1  . Smokeless tobacco: Never Used  Vaping Use  . Vaping Use: Never used  Substance and Sexual Activity  . Alcohol use: Not Currently    Alcohol/week: 14.0 standard drinks    Types: 14 Cans of beer per week    Comment: 2 beers a day  . Drug use: Never  . Sexual activity: Not on file  Other Topics Concern  . Not on file  Social History Narrative  . Not on file   Social Determinants of Health   Financial Resource Strain: Not on file  Food Insecurity: Not on file  Transportation Needs: Not on file  Physical Activity: Not on file  Stress: Not on file  Social Connections: Not on file  Intimate Partner Violence: Not on file     ROS- All systems are reviewed and negative except as per the HPI above.  Physical Exam: Vitals:   07/20/20 0904  BP: 134/84  Pulse: 78  Weight: 83 kg  Height: 5\' 10"  (1.778 m)    GEN- The patient is well appearing elderly male, alert and oriented x 3 today.   Head- normocephalic, atraumatic Eyes-  Sclera clear, conjunctiva pink Ears- hearing intact Oropharynx- clear Neck- supple  Lungs- Clear to ausculation bilaterally, normal work of breathing Heart-  irregular rate and rhythm, no murmurs, rubs or gallops  GI- soft, NT, ND, + BS Extremities- no clubbing, cyanosis, or edema MS- no significant deformity or atrophy Skin- no rash or lesion Psych- euthymic mood, full affect Neuro- strength and sensation are intact  Wt Readings from Last 3 Encounters:  07/20/20 83 kg  06/19/20 81.6 kg  06/19/20 83 kg    EKG today demonstrates  Coarse afib Vent. rate 78 BPM PR interval * ms QRS duration 86 ms QT/QTc 364/414 ms  Echo 11/05/19 demonstrated  1. Left ventricular ejection fraction, by estimation, is 60 to 65%. The  left ventricle has  normal function. The left ventricle has no regional  wall motion abnormalities. Left ventricular diastolic parameters were  normal.  2. Right ventricular systolic function is normal. The right ventricular  size is normal. There is normal pulmonary artery systolic pressure.  3. The mitral valve is normal in structure. No evidence of mitral valve  regurgitation. No evidence of mitral stenosis.  4. The aortic valve is normal in structure. Aortic valve regurgitation is  not visualized. No aortic stenosis is present.  5. Aortic dilatation noted. There is borderline dilatation of the aortic  root.  6. The inferior vena cava is normal in size with greater than 50%  respiratory variability, suggesting right atrial pressure of 3 mmHg.   Cardiac CT 07/14/20 Large Wind-sock appendage with prominent lateral trabeculation with maximum landing zone diameter 23 mm Suitable for a 27 Watchman FLX device with 14.8% compression  Epic records are reviewed at length today  HAS-BLED score 3 Hypertension Yes  Abnormal renal and liver function (Dialysis, transplant, Cr >2.26 mg/dL /Cirrhosis or Bilirubin >2x Normal or AST/ALT/AP >3x Normal) No  Stroke Yes  Bleeding No  Labile INR (Unstable/high INR) No  Elderly (>65) Yes  Drugs or alcohol (? 8 drinks/week, anti-plt or NSAID) No   CHA2DS2-VASc Score = 5  The  patient's score is based upon: CHF History: No HTN History: Yes Diabetes History: No Stroke History: Yes Vascular Disease History: No Age Score: 2 Gender Score: 0      ASSESSMENT AND PLAN: 1. Paroxysmal Atrial Fibrillation (ICD10:  I48.0) The patient's CHA2DS2-VASc score is 5, indicating a 7.2% annual risk of stroke.  He is in rate controlled afib today but asymptomatic.   I have seen Garen Elai Vanwyk is a 80 y.o. male in the office today who has been referred by Dr Harrington Challenger for a Watchman left atrial appendage closure device.  He has a history of paroxysmal atrial fibrillation.  This patient's CHA2DS2-VASc Score and unadjusted Ischemic Stroke Rate (% per year) is equal to 7.2 % stroke rate/year from a score of 5 which necessitates long term oral anticoagulation to prevent stroke. HasBled score is 3.  Unfortunately, He is not felt to be a long term anticoagulation candidate secondary to a high fall risk with cerebellar ataxia. Procedural risks for the Watchman implant have again been reviewed with the patient including a 0.5% risk of stroke, <1% risk of perforation, <1% risk of device embolization.   Continue Eliquis 5 mg BID. Hold morning of procedure. Start ASA 81 mg morning of implant Check CXR Check bmet/CBC Continue metoprolol 50 mg BID  2. 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  Continue Apixaban (Eliquis). See plans above.  3. HTN Stable, no changes today.   Follow up for Watchman implant on 07/27/20.    [place] [facility] [address] [place], [place] [address] [phone] 07/20/2020 9:16 AM

## 2020-07-20 ENCOUNTER — Encounter (HOSPITAL_COMMUNITY): Payer: Self-pay | Admitting: Physician Assistant

## 2020-07-20 ENCOUNTER — Ambulatory Visit (HOSPITAL_COMMUNITY)
Admission: RE | Admit: 2020-07-20 | Discharge: 2020-07-20 | Disposition: A | Discharge status: Home / Self Care | Payer: Medicare Other | Source: Ambulatory Visit | Attending: Physician Assistant | Admitting: Physician Assistant

## 2020-07-20 ENCOUNTER — Other Ambulatory Visit: Payer: Self-pay

## 2020-07-20 VITALS — BP 134/84 | HR 78 | Ht 70.0 in | Wt 183.0 lb

## 2020-07-20 DIAGNOSIS — I4891 Unspecified atrial fibrillation: Secondary | ICD-10-CM | POA: Diagnosis not present

## 2020-07-20 DIAGNOSIS — I10 Essential (primary) hypertension: Secondary | ICD-10-CM | POA: Diagnosis not present

## 2020-07-20 DIAGNOSIS — D6869 Other thrombophilia: Secondary | ICD-10-CM | POA: Diagnosis not present

## 2020-07-20 DIAGNOSIS — Z885 Allergy status to narcotic agent status: Secondary | ICD-10-CM | POA: Insufficient documentation

## 2020-07-20 DIAGNOSIS — Z7901 Long term (current) use of anticoagulants: Secondary | ICD-10-CM | POA: Diagnosis not present

## 2020-07-20 DIAGNOSIS — I480 Paroxysmal atrial fibrillation: Secondary | ICD-10-CM | POA: Diagnosis not present

## 2020-07-20 DIAGNOSIS — Z01818 Encounter for other preprocedural examination: Secondary | ICD-10-CM | POA: Diagnosis not present

## 2020-07-20 DIAGNOSIS — Z8673 Personal history of transient ischemic attack (TIA), and cerebral infarction without residual deficits: Secondary | ICD-10-CM | POA: Diagnosis not present

## 2020-07-20 DIAGNOSIS — Z87891 Personal history of nicotine dependence: Secondary | ICD-10-CM | POA: Diagnosis not present

## 2020-07-20 LAB — BASIC METABOLIC PANEL
Anion gap: 10 (ref 5–15)
BUN: 16 mg/dL (ref 8–23)
CO2: 23 mmol/L (ref 22–32)
Calcium: 8.9 mg/dL (ref 8.9–10.3)
Chloride: 104 mmol/L (ref 98–111)
Creatinine, Ser: 1.05 mg/dL (ref 0.61–1.24)
GFR, Estimated: >60 mL/min (ref >60)
Glucose, Bld: 91 mg/dL (ref 70–99)
Potassium: 5.3 mmol/L — ABNORMAL HIGH (ref 3.5–5.1)
Sodium: 137 mmol/L (ref 135–145)

## 2020-07-20 LAB — CBC
HCT: 44.1 % (ref 39.0–52.0)
Hemoglobin: 15.4 g/dL (ref 13.0–17.0)
MCH: 33.8 pg (ref 26.0–34.0)
MCHC: 34.9 g/dL (ref 30.0–36.0)
MCV: 96.7 fL (ref 80.0–100.0)
Platelets: 319 10*3/uL (ref 150–400)
RBC: 4.56 MIL/uL (ref 4.22–5.81)
RDW: 11.9 % (ref 11.5–15.5)
WBC: 7.2 10*3/uL (ref 4.0–10.5)
nRBC: 0 % (ref 0.0–0.2)

## 2020-07-20 MED ORDER — ASPIRIN EC 81 MG PO TBEC: 81.0000 mg | DELAYED_RELEASE_TABLET | Freq: Every day | ORAL | 11 refills | Status: DC

## 2020-07-20 NOTE — Patient Instructions (Addendum)
Start aspirin 81mg  once a day on Thursday, March 10th   LEFT ATRIAL APPENDAGE CLOSURE INSTRUCTIONS:  You are scheduled for a Left Atrial Appendage Device Implantation on Thursday, March 10th  1. Please arrive at the [facility] (Main Entrance A) at [facility]: [address] [place], [place] [address] at 730AM   (This time is two hours before your procedure to ensure your preparation). Free valet parking service is available. You are allowed ONE visitor in the waiting room during your procedure. Both you and your guest must wear masks. Special note: Every effort is made to have your procedure done on time. Please understand that emergencies sometimes delay scheduled procedures.  2.   Do not eat or drink after midnight prior to your procedure.     3.   Medication Instructions: Take ONLY the listed medications morning of procedure with a sip of water:    Aspirin and telmisartan only  4. Plan for one night stay--bring personal belongings. When you are discharged, you will need someone to drive you home and stay with you for 24 hours.  5. Bring a current list of your medications and current insurance cards.  6. Please wear clothes that are easy to get on and off and wear slip-on shoes.  7. [name], the [facility], will arrange follow-up for you during your hospital stay and you will be discharged with your appointment dates/times. His direct number is [phone] if you need assistance.  **If you have any questions, do not hesitate to call the office! You will also be contacted the week of your procedure to confirm instructions.**

## 2020-07-25 ENCOUNTER — Other Ambulatory Visit (HOSPITAL_COMMUNITY)
Admission: RE | Admit: 2020-07-25 | Discharge: 2020-07-25 | Disposition: A | Discharge status: Home / Self Care | Payer: Medicare Other | Source: Ambulatory Visit | Attending: Cardiology | Admitting: Cardiology

## 2020-07-25 DIAGNOSIS — Z20822 Contact with and (suspected) exposure to covid-19: Secondary | ICD-10-CM | POA: Insufficient documentation

## 2020-07-25 DIAGNOSIS — Z01812 Encounter for preprocedural laboratory examination: Secondary | ICD-10-CM | POA: Insufficient documentation

## 2020-07-25 LAB — SARS CORONAVIRUS 2 (TAT 6-24 HRS): SARS Coronavirus 2: NEGATIVE

## 2020-07-26 ENCOUNTER — Encounter (HOSPITAL_COMMUNITY): Payer: Self-pay | Admitting: Physician Assistant

## 2020-07-26 NOTE — Telephone Encounter (Signed)
[employer] and spoke with [place]. They received lab sample back from pt 07/20/20. Takes 2-4 wks to process. Should have results end of March/beginning of April. They will fax results to [place].

## 2020-07-26 NOTE — Progress Notes (Signed)
PCP - Dr Domenick Gong Cardiologist - Dr Dorris Carnes Neuro - Dr Audrea Muscat Thenganatt  Chest x-ray - 07/20/20 (2V) EKG - 07/20/20 Stress Test - Back in 1980s, unknown location ECHO - 11/05/19 Cardiac Cath - n/a  Anesthesia review: Yes  Coronavirus Screening Covid test on 07/25/20 was negative.

## 2020-07-27 ENCOUNTER — Ambulatory Visit (HOSPITAL_COMMUNITY): Admission: RE | Admit: 2020-07-27 | Payer: Medicare Other | Source: Home / Self Care | Admitting: Cardiology

## 2020-07-27 ENCOUNTER — Ambulatory Visit (HOSPITAL_COMMUNITY): Payer: Medicare Other | Admitting: Anesthesiology

## 2020-07-27 ENCOUNTER — Encounter (HOSPITAL_COMMUNITY)
Admission: RE | Disposition: A | Discharge status: Home / Self Care | Payer: Self-pay | Source: Home / Self Care | Attending: Cardiology

## 2020-07-27 ENCOUNTER — Other Ambulatory Visit: Payer: Self-pay

## 2020-07-27 ENCOUNTER — Inpatient Hospital Stay (HOSPITAL_COMMUNITY)
Admission: RE | Admit: 2020-07-27 | Discharge: 2020-07-28 | DRG: 274 | Disposition: A | Discharge status: Home / Self Care | Payer: Medicare Other | Attending: Cardiology | Admitting: Cardiology

## 2020-07-27 ENCOUNTER — Ambulatory Visit (HOSPITAL_COMMUNITY)
Admission: RE | Admit: 2020-07-27 | Discharge: 2020-07-27 | Disposition: A | Discharge status: Home / Self Care | Payer: Medicare Other | Source: Ambulatory Visit | Attending: Cardiology | Admitting: Cardiology

## 2020-07-27 ENCOUNTER — Encounter (HOSPITAL_COMMUNITY): Admission: RE | Payer: Self-pay | Source: Home / Self Care

## 2020-07-27 ENCOUNTER — Encounter (HOSPITAL_COMMUNITY): Payer: Self-pay | Admitting: Cardiovascular Disease

## 2020-07-27 ENCOUNTER — Inpatient Hospital Stay (HOSPITAL_COMMUNITY)
Admission: RE | Admit: 2020-07-27 | Discharge: 2020-07-27 | Disposition: A | Discharge status: Home / Self Care | Payer: Medicare Other | Source: Ambulatory Visit | Attending: Cardiology | Admitting: Cardiology

## 2020-07-27 ENCOUNTER — Other Ambulatory Visit (HOSPITAL_COMMUNITY): Payer: Self-pay | Admitting: Nurse Practitioner

## 2020-07-27 DIAGNOSIS — I10 Essential (primary) hypertension: Secondary | ICD-10-CM | POA: Diagnosis present

## 2020-07-27 DIAGNOSIS — Z7901 Long term (current) use of anticoagulants: Secondary | ICD-10-CM | POA: Diagnosis not present

## 2020-07-27 DIAGNOSIS — Z87442 Personal history of urinary calculi: Secondary | ICD-10-CM

## 2020-07-27 DIAGNOSIS — G119 Hereditary ataxia, unspecified: Secondary | ICD-10-CM | POA: Diagnosis present

## 2020-07-27 DIAGNOSIS — Z8673 Personal history of transient ischemic attack (TIA), and cerebral infarction without residual deficits: Secondary | ICD-10-CM

## 2020-07-27 DIAGNOSIS — Z87891 Personal history of nicotine dependence: Secondary | ICD-10-CM | POA: Diagnosis not present

## 2020-07-27 DIAGNOSIS — I480 Paroxysmal atrial fibrillation: Secondary | ICD-10-CM | POA: Diagnosis not present

## 2020-07-27 DIAGNOSIS — Z885 Allergy status to narcotic agent status: Secondary | ICD-10-CM

## 2020-07-27 DIAGNOSIS — M5417 Radiculopathy, lumbosacral region: Secondary | ICD-10-CM | POA: Diagnosis not present

## 2020-07-27 DIAGNOSIS — Z006 Encounter for examination for normal comparison and control in clinical research program: Secondary | ICD-10-CM

## 2020-07-27 DIAGNOSIS — Z79899 Other long term (current) drug therapy: Secondary | ICD-10-CM | POA: Diagnosis not present

## 2020-07-27 DIAGNOSIS — Z888 Allergy status to other drugs, medicaments and biological substances status: Secondary | ICD-10-CM

## 2020-07-27 DIAGNOSIS — D6869 Other thrombophilia: Secondary | ICD-10-CM | POA: Diagnosis not present

## 2020-07-27 DIAGNOSIS — I071 Rheumatic tricuspid insufficiency: Secondary | ICD-10-CM | POA: Diagnosis not present

## 2020-07-27 DIAGNOSIS — G459 Transient cerebral ischemic attack, unspecified: Secondary | ICD-10-CM | POA: Diagnosis not present

## 2020-07-27 DIAGNOSIS — Z20822 Contact with and (suspected) exposure to covid-19: Secondary | ICD-10-CM | POA: Diagnosis not present

## 2020-07-27 HISTORY — PX: TEE WITHOUT CARDIOVERSION: SHX5443

## 2020-07-27 HISTORY — PX: LEFT ATRIAL APPENDAGE OCCLUSION: EP1229

## 2020-07-27 LAB — ABO/RH: ABO/RH(D): O POS

## 2020-07-27 LAB — POCT ACTIVATED CLOTTING TIME: Activated Clotting Time: 362 seconds

## 2020-07-27 LAB — TYPE AND SCREEN
ABO/RH(D): O POS
Antibody Screen: NEGATIVE

## 2020-07-27 LAB — SURGICAL PCR SCREEN
MRSA, PCR: NEGATIVE
Staphylococcus aureus: NEGATIVE

## 2020-07-27 LAB — GLUCOSE, CAPILLARY: Glucose-Capillary: 150 mg/dL — ABNORMAL HIGH (ref 70–99)

## 2020-07-27 SURGERY — LEFT ATRIAL APPENDAGE OCCLUSION
Anesthesia: General

## 2020-07-27 MED ORDER — CHLORHEXIDINE GLUCONATE 0.12 % MT SOLN
15.0000 mL | Freq: Once | OROMUCOSAL | Status: AC
  Administered 2020-07-27: 15 mL via OROMUCOSAL
  Filled 2020-07-27: qty 15

## 2020-07-27 MED ORDER — PROPOFOL 10 MG/ML IV BOLUS
INTRAVENOUS | Status: DC | PRN
  Administered 2020-07-27: 190 mg via INTRAVENOUS
  Administered 2020-07-27: 30 mg via INTRAVENOUS

## 2020-07-27 MED ORDER — ACETAMINOPHEN 500 MG PO TABS: 500.0000 mg | ORAL_TABLET | Freq: Four times a day (QID) | ORAL | Status: DC | PRN

## 2020-07-27 MED ORDER — SODIUM CHLORIDE 0.9 % IV SOLN: INTRAVENOUS | Status: DC

## 2020-07-27 MED ORDER — ESMOLOL HCL 100 MG/10ML IV SOLN
INTRAVENOUS | Status: DC | PRN
  Administered 2020-07-27 (×2): 20 mg via INTRAVENOUS

## 2020-07-27 MED ORDER — LIDOCAINE 2% (20 MG/ML) 5 ML SYRINGE
INTRAMUSCULAR | Status: DC | PRN
  Administered 2020-07-27: 100 mg via INTRAVENOUS

## 2020-07-27 MED ORDER — SODIUM CHLORIDE 0.9% FLUSH
3.0000 mL | Freq: Two times a day (BID) | INTRAVENOUS | Status: DC
  Administered 2020-07-27 – 2020-07-28 (×3): 3 mL via INTRAVENOUS

## 2020-07-27 MED ORDER — IOHEXOL 350 MG/ML SOLN
INTRAVENOUS | Status: DC | PRN
  Administered 2020-07-27 (×2): 10 mL

## 2020-07-27 MED ORDER — ROCURONIUM BROMIDE 10 MG/ML (PF) SYRINGE
PREFILLED_SYRINGE | INTRAVENOUS | Status: DC | PRN
  Administered 2020-07-27: 100 mg via INTRAVENOUS

## 2020-07-27 MED ORDER — LACTATED RINGERS IV SOLN: INTRAVENOUS | Status: DC | PRN

## 2020-07-27 MED ORDER — PANTOPRAZOLE SODIUM 40 MG PO TBEC
40.0000 mg | DELAYED_RELEASE_TABLET | Freq: Every day | ORAL | Status: DC
  Administered 2020-07-27 – 2020-07-28 (×2): 40 mg via ORAL
  Filled 2020-07-27 (×2): qty 1

## 2020-07-27 MED ORDER — ACETAMINOPHEN 500 MG PO TABS
1000.0000 mg | ORAL_TABLET | Freq: Once | ORAL | Status: AC
  Administered 2020-07-27: 1000 mg via ORAL

## 2020-07-27 MED ORDER — HEPARIN (PORCINE) IN NACL 1000-0.9 UT/500ML-% IV SOLN
INTRAVENOUS | Status: AC
  Filled 2020-07-27: qty 500

## 2020-07-27 MED ORDER — ATORVASTATIN CALCIUM 40 MG PO TABS
40.0000 mg | ORAL_TABLET | Freq: Every day | ORAL | Status: DC
  Administered 2020-07-27 – 2020-07-28 (×2): 40 mg via ORAL
  Filled 2020-07-27 (×2): qty 1

## 2020-07-27 MED ORDER — PHENYLEPHRINE HCL-NACL 10-0.9 MG/250ML-% IV SOLN
INTRAVENOUS | Status: DC | PRN
  Administered 2020-07-27: 25 ug/min via INTRAVENOUS

## 2020-07-27 MED ORDER — SODIUM CHLORIDE 0.9 % IV SOLN: 250.0000 mL | INTRAVENOUS | Status: DC | PRN

## 2020-07-27 MED ORDER — HEPARIN (PORCINE) IN NACL 2000-0.9 UNIT/L-% IV SOLN
INTRAVENOUS | Status: DC | PRN
  Administered 2020-07-27: 1000 mL

## 2020-07-27 MED ORDER — DEXAMETHASONE SODIUM PHOSPHATE 10 MG/ML IJ SOLN
INTRAMUSCULAR | Status: DC | PRN
  Administered 2020-07-27: 5 mg via INTRAVENOUS

## 2020-07-27 MED ORDER — FLUTICASONE PROPIONATE 50 MCG/ACT NA SUSP
1.0000 | NASAL | Status: DC | PRN
  Filled 2020-07-27: qty 16

## 2020-07-27 MED ORDER — HEPARIN (PORCINE) IN NACL 1000-0.9 UT/500ML-% IV SOLN
INTRAVENOUS | Status: DC | PRN
  Administered 2020-07-27: 500 mL

## 2020-07-27 MED ORDER — SODIUM CHLORIDE 0.9% FLUSH
3.0000 mL | INTRAVENOUS | Status: DC | PRN
  Administered 2020-07-27: 3 mL via INTRAVENOUS

## 2020-07-27 MED ORDER — ONDANSETRON HCL 4 MG/2ML IJ SOLN
INTRAMUSCULAR | Status: DC | PRN
  Administered 2020-07-27: 4 mg via INTRAVENOUS

## 2020-07-27 MED ORDER — ASPIRIN EC 81 MG PO TBEC
81.0000 mg | DELAYED_RELEASE_TABLET | Freq: Every day | ORAL | Status: DC
  Administered 2020-07-28: 81 mg via ORAL
  Filled 2020-07-27: qty 1

## 2020-07-27 MED ORDER — FENTANYL CITRATE (PF) 250 MCG/5ML IJ SOLN
INTRAMUSCULAR | Status: DC | PRN
  Administered 2020-07-27: 100 ug via INTRAVENOUS

## 2020-07-27 MED ORDER — HEPARIN (PORCINE) IN NACL 2000-0.9 UNIT/L-% IV SOLN
INTRAVENOUS | Status: AC
  Filled 2020-07-27: qty 1000

## 2020-07-27 MED ORDER — IRBESARTAN 150 MG PO TABS
150.0000 mg | ORAL_TABLET | Freq: Every day | ORAL | Status: DC
  Administered 2020-07-27 – 2020-07-28 (×2): 150 mg via ORAL
  Filled 2020-07-27 (×2): qty 1

## 2020-07-27 MED ORDER — ONDANSETRON HCL 4 MG/2ML IJ SOLN: 4.0000 mg | Freq: Four times a day (QID) | INTRAMUSCULAR | Status: DC | PRN

## 2020-07-27 MED ORDER — APIXABAN 5 MG PO TABS
5.0000 mg | ORAL_TABLET | Freq: Two times a day (BID) | ORAL | Status: DC
  Administered 2020-07-27 – 2020-07-28 (×3): 5 mg via ORAL
  Filled 2020-07-27 (×3): qty 1

## 2020-07-27 MED ORDER — HEPARIN SODIUM (PORCINE) 1000 UNIT/ML IJ SOLN
INTRAMUSCULAR | Status: DC | PRN
  Administered 2020-07-27: 12000 [IU] via INTRAVENOUS

## 2020-07-27 MED ORDER — SUGAMMADEX SODIUM 200 MG/2ML IV SOLN
INTRAVENOUS | Status: DC | PRN
  Administered 2020-07-27: 400 mg via INTRAVENOUS

## 2020-07-27 MED ORDER — CEFAZOLIN SODIUM-DEXTROSE 2-4 GM/100ML-% IV SOLN
2.0000 g | INTRAVENOUS | Status: AC
  Administered 2020-07-27: 2 g via INTRAVENOUS

## 2020-07-27 MED ORDER — PROTAMINE SULFATE 10 MG/ML IV SOLN
INTRAVENOUS | Status: DC | PRN
  Administered 2020-07-27: 30 mg via INTRAVENOUS

## 2020-07-27 SURGICAL SUPPLY — 19 items
BLANKET WARM UNDERBOD FULL ACC (MISCELLANEOUS) ×1 IMPLANT
CATH DIAG 6FR PIGTAIL ANGLED (CATHETERS) ×2 IMPLANT
CLOSURE PERCLOSE PROSTYLE (VASCULAR PRODUCTS) ×2 IMPLANT
DEVICE WATCHMAN FLX PROC (KITS) IMPLANT
DILATOR VESSEL 38 20CM 12FR (INTRODUCER) ×1 IMPLANT
DILATOR VESSEL 38 20CM 16FR (INTRODUCER) ×2 IMPLANT
DRYSEAL FLEXSHEATH 15FR 33CM (SHEATH) ×1
KIT HEART LEFT (KITS) ×1 IMPLANT
KIT VERSACROSS LRG ACCESS (CATHETERS) ×1 IMPLANT
SHEATH DRYSEAL FLEX 15FR 33CM (SHEATH) IMPLANT
SHEATH PINNACLE 8F 10CM (SHEATH) ×1 IMPLANT
SHEATH PROBE COVER 6X72 (BAG) ×1 IMPLANT
SHIELD RADPAD SCOOP 12X17 (MISCELLANEOUS) ×1 IMPLANT
TRANSDUCER W/STOPCOCK (MISCELLANEOUS) ×1 IMPLANT
TUBING CIL FLEX 10 FLL-RA (TUBING) ×1 IMPLANT
WATCHMAN FLX 27 (Prosthesis & Implant Heart) ×1 IMPLANT
WATCHMAN FLX PROCEDURE DEVICE (KITS) ×2 IMPLANT
WATCHMAN PROCED TRUSEAL ACCESS (SHEATH) ×1 IMPLANT
WATCHMAN TRUSEAL DOUBLE CURVE (SHEATH) ×1 IMPLANT

## 2020-07-27 NOTE — Op Note (Addendum)
Watchman LAAO Procedure   Surgeon: Sherren Mocha, MD Co-Surgeon: Lars Mage, MD Imager: Marry Guan, MD   Procedure: 1) Transseptal Puncture 2) Watchman left atrial appendage occlusion 3) Perclose right femoral vein   Device Implant: Watchman #27   Background and Indication: 80 yo man with atrial fibrillation, cerebellar ataxia, falls, felt to be a poor candidate for long term anticoagulation. He is referred for Watchman implantation.    Procedure description: US guidance is used for right femoral venous access. [place] images are stored digitally in the patient's chart. Double Preclose is performed at 10" and 2" positions using normal technique and a 16 Fr sheath is inserted. Transseptal puncture is then performed after fully anticoagulating the patient with unfractionated heparin. A Versacross system is used with RF energy to cross the interatrial septum under fluoroscopic and TEE guidance. Please see Dr Antionette Char detailed report for further description of transseptal puncture and Watchman Access Sheath insertion.   Once the 14 Fr access sheath is in appropriate position in the left atrial appendage, a 27 mm Watchman Flex device is inserted and deployed to obtain appropriate position and compression in the left atrial appendage. 0 recaptures are required. Thorough TEE imaging is performed to evaluate all PASS criteria prior to device release. Angiography is performed prior to device release. After device release, the device remains in stable position with complete occlusion of the appendage. The sheath is removed from the body and the previously deployed perclose sutures are tightened for complete hemostasis.    EBL: minimal   Pt is transferred to the recovery area following the procedure with no early apparent complications.     Lysbeth Galas T. Quentin Ore, MD, [facility] Cardiac Electrophysiology

## 2020-07-27 NOTE — Anesthesia Postprocedure Evaluation (Signed)
Anesthesia Post Note  Patient: Derrick White  Procedure(s) Performed: LEFT ATRIAL APPENDAGE OCCLUSION (N/A ) TRANSESOPHAGEAL ECHOCARDIOGRAM (TEE) (N/A )     Patient location during evaluation: PACU Anesthesia Type: General Level of consciousness: sedated Pain management: pain level controlled Vital Signs Assessment: post-procedure vital signs reviewed and stable Respiratory status: spontaneous breathing and respiratory function stable Cardiovascular status: stable Postop Assessment: no apparent nausea or vomiting Anesthetic complications: no   No complications documented.  Last Vitals:  Vitals:   07/27/20 1210 07/27/20 1215  BP: 119/78 108/75  Pulse: 80 76  Resp: 15 16  Temp:    SpO2: 98% 99%    Last Pain:  Vitals:   07/27/20 1200  TempSrc: Temporal  PainSc: 0-No pain                 Anamika Kueker DANIEL

## 2020-07-27 NOTE — Transfer of Care (Signed)
Immediate Anesthesia Transfer of Care Note  Patient: Deroy Franne Grip  Procedure(s) Performed: LEFT ATRIAL APPENDAGE OCCLUSION (N/A ) TRANSESOPHAGEAL ECHOCARDIOGRAM (TEE) (N/A )  Patient Location: Cath Lab  Anesthesia Type:General  Level of Consciousness: awake, alert  and oriented  Airway & Oxygen Therapy: Patient Spontanous Breathing and Patient connected to nasal cannula oxygen  Post-op Assessment: Report given to RN, Post -op Vital signs reviewed and stable and Patient moving all extremities X 4  Post vital signs: Reviewed and stable  Last Vitals:  Vitals Value Taken Time  BP    Temp    Pulse 58 07/27/20 1117  Resp 14 07/27/20 1117  SpO2 77 % 07/27/20 1117  Vitals shown include unvalidated device data.  Last Pain:  Vitals:   07/27/20 0809  PainSc: 0-No pain         Complications: No complications documented.

## 2020-07-27 NOTE — Anesthesia Preprocedure Evaluation (Addendum)
Anesthesia Evaluation  Patient identified by MRN, date of birth, ID band Patient awake    Reviewed: Allergy & Precautions, NPO status , Patient's Chart, lab work & pertinent test results  History of Anesthesia Complications Negative for: history of anesthetic complications  Airway Mallampati: III  TM Distance: >3 FB Neck ROM: Full    Dental  (+) Partial Upper, Implants, Dental Advisory Given   Pulmonary former smoker,    Pulmonary exam normal        Cardiovascular hypertension, Pt. 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  Neuromuscular disease    GI/Hepatic Neg liver ROS, hiatal hernia, GERD  Medicated and Controlled,  Endo/Other  Hyperlipidemia   Renal/GU negative Renal ROS  negative genitourinary   Musculoskeletal Right inguinal hernia   Abdominal   Peds  Hematology negative hematology ROS (+)   Anesthesia Other Findings   Reproductive/Obstetrics                            Anesthesia Physical  Anesthesia Plan  ASA: III  Anesthesia Plan: General   Post-op Pain Management:    Induction: Intravenous  PONV Risk Score and Plan: 2 and Ondansetron  Airway Management Planned: Oral ETT  Additional Equipment:   Intra-op Plan:   Post-operative Plan: Extubation in OR  Informed Consent: I have reviewed the patients History and Physical, chart, labs and discussed the procedure including the risks, benefits and alternatives for the proposed anesthesia with the patient or authorized representative who has indicated his/her understanding and acceptance.     Dental advisory given  Plan Discussed with: Anesthesiologist and CRNA  Anesthesia Plan Comments:        Anesthesia Quick Evaluation

## 2020-07-27 NOTE — Anesthesia Procedure Notes (Signed)
Procedure Name: Intubation Date/Time: 07/27/2020 9:56 AM Performed by: Gaylene Brooks, CRNA Pre-anesthesia Checklist: Patient identified, Emergency Drugs available, Suction available and Patient being monitored Patient Re-evaluated:Patient Re-evaluated prior to induction Oxygen Delivery Method: Circle System Utilized Preoxygenation: Pre-oxygenation with 100% oxygen Induction Type: IV induction Ventilation: Mask ventilation without difficulty Laryngoscope Size: Miller and 2 Grade View: Grade II Tube type: Oral Tube size: 7.5 mm Number of attempts: 1 Airway Equipment and Method: Stylet and Oral airway Placement Confirmation: ETT inserted through vocal cords under direct vision,  positive ETCO2 and breath sounds checked- equal and bilateral Secured at: 22 cm Tube secured with: Tape Dental Injury: Teeth and Oropharynx as per pre-operative assessment

## 2020-07-27 NOTE — Progress Notes (Signed)
  Echocardiogram TEE has been performed.  Merrie Roof F 07/27/2020, 12:21 PM

## 2020-07-27 NOTE — Interval H&P Note (Signed)
History and Physical Interval Note:  07/27/2020 9:09 AM  Derrick White  has presented today for surgery, with the diagnosis of afib.  The various methods of treatment have been discussed with the patient and family. After consideration of risks, benefits and other options for treatment, the patient has consented to  Procedure(s): LEFT ATRIAL APPENDAGE OCCLUSION (N/A) TRANSESOPHAGEAL ECHOCARDIOGRAM (TEE) (N/A) as a surgical intervention.  The patient's history has been reviewed, patient examined, no change in status, stable for surgery.  I have reviewed the patient's chart and labs.  Questions were answered to the patient's satisfaction.     Derrick White

## 2020-07-27 NOTE — Plan of Care (Signed)
  Problem: Education: Goal: Knowledge of General Education information will improve Description: Including pain rating scale, medication(s)/side effects and non-pharmacologic comfort measures Outcome: Progressing   Problem: Clinical Measurements: Goal: Ability to maintain clinical measurements within normal limits will improve Outcome: Progressing Goal: Will remain free from infection Outcome: Progressing Goal: Cardiovascular complication will be avoided Outcome: Progressing   Problem: Pain Managment: Goal: General experience of comfort will improve Outcome: Progressing   Problem: Skin Integrity: Goal: Risk for impaired skin integrity will decrease Outcome: Progressing

## 2020-07-27 NOTE — Interval H&P Note (Signed)
History and Physical Interval Note:  07/27/2020 9:19 AM  Derrick White  has presented today for surgery, with the diagnosis of afib.  The various methods of treatment have been discussed with the patient and family. After consideration of risks, benefits and other options for treatment, the patient has consented to  Procedure(s): LEFT ATRIAL APPENDAGE OCCLUSION (N/A) TRANSESOPHAGEAL ECHOCARDIOGRAM (TEE) (N/A) as a surgical intervention.  The patient's history has been reviewed, patient examined, no change in status, stable for surgery.  I have reviewed the patient's chart and labs.  Questions were answered to the patient's satisfaction.     Sherren Mocha

## 2020-07-27 NOTE — Discharge Summary (Incomplete)
ELECTROPHYSIOLOGY PROCEDURE DISCHARGE SUMMARY    Patient ID: Derrick White,  MRN: [id number], DOB/AGE: 24-May-1940 80 y.o.  Admit date: 07/27/2020 Discharge date: 07/28/20   Primary Care Physician: Haywood Pao, MD  Primary Cardiologist: Dorris Carnes, MD  Electrophysiologist: Vickie Epley, MD  Primary Discharge Diagnosis:  Paroxysmal Atrial Fibrillation Poor candidacy for long term anticoagulation due to high fall risk due to cerebellar ataxia  Secondary Discharge Diagnosis:  H/o CVA Cerebellar Ataxia  Procedures This Admission:  1. Transeptal Puncture 2. Intra-procedural TEE which showed no LAA thrombus 3. Left atrial appendage occlusive device placement on 07/26/2020 by Dr. Quentin Ore and Dr. Burt Knack.  This study demonstrated: Successful implantation of a 27 mm WATCHMAN flex left atrial appendage occlusive device with no early apparent complications  Brief HPI: Derrick White is a 80 y.o. male with a history of Paroxysmal Atrial Fibrillation. The patient was seen as an outpatient and thought to be a poor candidate for continued anticoagulation due to high fall risk as above. Risks, benefits, and alternatives to left atrial appendage occlusive device placement device were reviewed with the patient who wished to proceed.  The patient underwent intra-procedural TEE as above.   Hospital Course:  The patient was admitted and underwent Left Atrial appendage occlusive device placement with details as outlined above.  They were monitored on telemetry overnight which demonstrated AF with controlled rates.  Groin was without complication on the day of discharge.  The patient was examined and considered to be stable for discharge.  Wound care and restrictions were reviewed with the patient.  The patient will be seen back by Adline Peals, PA in 1 week and approximately 6 weeks with repeat TEE in approximately 45 days post procedure to ensure proper seal of the device. They will  follow up with Dr. Quentin Ore in 12 weeks for post ablation follow up.   CHA2DS2VASC of at least 5    The patient understands the importance of continuing on their Eliquis and a 81 mg ASA for 45 days. If they are on coumadin, they will have close follow up for INR check as below. They understand that after 45 days, they will then take 81 mg ASA and Plavix for a total of 6 months post procedure pending Physician recommendation at that time.    Physical Exam: Vitals:   07/27/20 2306 07/28/20 0346 07/28/20 0350 07/28/20 0807  BP: 99/78 (!) 110/98    Pulse: 85 88    Resp: 20 14    Temp: 97.9 F (36.6 C) 97.6 F (36.4 C)    TempSrc: Oral Oral    SpO2: 96% 94%  92%  Weight:   82.2 kg   Height:   5\' 10"  (1.778 m)     GEN- The patient is well appearing, alert and oriented x 3 today.   HEENT: normocephalic, atraumatic; sclera clear, conjunctiva pink; hearing intact; oropharynx clear; neck supple  Lungs- Clear to ausculation bilaterally, normal work of breathing.  No wheezes, rales, rhonchi Heart- Regular rate and rhythm, no murmurs, rubs or gallops  GI- soft, non-tender, non-distended, bowel sounds present  Extremities- no clubbing, cyanosis, or edema; DP/PT/radial pulses 2+ bilaterally, groin without hematoma/bruit MS- no significant deformity or atrophy Skin- warm and dry, no rash or lesion Psych- euthymic mood, full affect Neuro- strength and sensation are intact   Labs:   Lab Results  Component Value Date   WBC 7.2 07/20/2020   HGB 15.4 07/20/2020   HCT 44.1 07/20/2020  MCV 96.7 07/20/2020   PLT 319 07/20/2020    Recent Labs  Lab 07/28/20 0042  NA 136  K 4.4  CL 104  CO2 25  BUN 19  CREATININE 1.17  CALCIUM 9.1  GLUCOSE 141*     Discharge Medications:  Allergies as of 07/28/2020      Reactions   Morphine Hives, Itching   Codeine Hives, Itching   Propoxyphene Itching   DARVOCET   Shrimp [shellfish Allergy] Rash      Medication List    STOP taking these  medications   ibuprofen 200 MG tablet Commonly known as: ADVIL     TAKE these medications   acetaminophen 500 MG tablet Commonly known as: TYLENOL Take 500 mg by mouth every 6 (six) hours as needed for moderate pain or headache.   ALIGN PO Take 1 capsule by mouth daily.   apixaban 5 MG Tabs tablet Commonly known as: ELIQUIS Take 1 tablet (5 mg total) by mouth 2 (two) times daily.   aspirin EC 81 MG tablet Take 1 tablet (81 mg total) by mouth daily. Swallow whole.   atorvastatin 40 MG tablet Commonly known as: LIPITOR Take 1 tablet (40 mg total) by mouth daily.   Cholecalciferol 25 MCG (1000 UT) tablet Take 1,000 Units by mouth every evening.   clotrimazole 1 % cream Commonly known as: LOTRIMIN Apply 1 application topically 2 (two) times daily as needed (skin irritation).   cyanocobalamin 1000 MCG tablet Take 1,000 mcg by mouth every evening.   fluticasone 50 MCG/ACT nasal spray Commonly known as: FLONASE Place 1 spray into both nostrils daily.   LEG CRAMPS PM SL Take 1 tablet by mouth at bedtime. Legatrin PM   pantoprazole 40 MG tablet Commonly known as: PROTONIX Take 40 mg by mouth daily.   Systane Balance 0.6 % Soln Generic drug: Propylene Glycol Place 1 drop into both eyes daily as needed (burning).   telmisartan 80 MG tablet Commonly known as: MICARDIS Take 80 mg by mouth daily.       Disposition:    Follow-up Information    Fenton, Clint R, PA Follow up.   Specialty: Cardiology Why: at the atrial fibrillation clinic on 4/18 at 1100 am for post watchman procedure follow up Contact information: [place] [address] [phone]               Duration of Discharge Encounter: Greater than 30 minutes including physician time.  Jacalyn Lefevre, PA-C  07/28/2020 8:30 AM

## 2020-07-28 LAB — BASIC METABOLIC PANEL
Anion gap: 7 (ref 5–15)
BUN: 19 mg/dL (ref 8–23)
CO2: 25 mmol/L (ref 22–32)
Calcium: 9.1 mg/dL (ref 8.9–10.3)
Chloride: 104 mmol/L (ref 98–111)
Creatinine, Ser: 1.17 mg/dL (ref 0.61–1.24)
GFR, Estimated: >60 mL/min (ref >60)
Glucose, Bld: 141 mg/dL — ABNORMAL HIGH (ref 70–99)
Potassium: 4.4 mmol/L (ref 3.5–5.1)
Sodium: 136 mmol/L (ref 135–145)

## 2020-07-28 NOTE — Plan of Care (Signed)
  Problem: Education: Goal: Knowledge of General Education information will improve Description: Including pain rating scale, medication(s)/side effects and non-pharmacologic comfort measures Outcome: Progressing   Problem: Clinical Measurements: Goal: Will remain free from infection Outcome: Progressing Goal: Cardiovascular complication will be avoided Outcome: Progressing   Problem: Activity: Goal: Risk for activity intolerance will decrease Outcome: Progressing   Problem: Nutrition: Goal: Adequate nutrition will be maintained Outcome: Progressing   

## 2020-07-28 NOTE — Discharge Instructions (Signed)
[facility] Procedure, Care After  Procedure MD: Dr. Benson Norway Clinical Coordinator: [name], RN  This sheet gives you information about how to care for yourself after your procedure. Your health care provider may also give you more specific instructions. If you have problems or questions, contact your health care provider.  What can I expect after the procedure? After the procedure, it is common to have:  Bruising around your puncture site.  Tenderness around your puncture site.  Tiredness (fatigue).  Medication instructions . It is very important to continue to take your blood thinner as well as Aspirin after the Watchman procedure. Call your procedure doctor's office with question or concerns. . If you are on Coumadin (warfarin), you will have your INR checked the week after your procedure, with a goal INR of 2.0 - 3.0. Marland Kitchen Please follow your medication instructions on your discharge summary. Only take the medications listed on your discharge paperwork.  Follow up . You will be seen in 1 week and 6 weeks after your procedure in the Atrial Fibrillation Clinic . You will have another TEE (Transesophageal Echocardiogram) approximately 6 weeks after your procedure mark to check your device . You will follow up the MD who performed your procedure 6 months after your procedure . The Watchman Clinical Coordinator will check in with you from time to time, including 1 and 2 years after your procedure.    Follow these instructions at home: Puncture site care   Follow instructions from your health care provider about how to take care of your puncture site. Make sure you: ? If present, leave stitches (sutures), skin glue, or adhesive strips in place.  ? If a large square bandage is present, this may be removed 24 hours after surgery.   Check your puncture site every day for signs of infection. Check for: ? Redness, swelling, or pain. ? Fluid or blood. If your puncture site starts  to bleed, lie down on your back, apply firm pressure to the area, and contact your health care provider. ? Warmth. ? Pus or a bad smell. Driving  Do not drive yourself home if you received sedation  Do not drive for at least 4 days after your procedure or however long your health care provider recommends. (Do not resume driving if you have previously been instructed not to drive for other health reasons.)  Do not spend greater than 1 hour at a time in a car for the first 3 days. Stop and take a break with a 5 minute walk at least every hour.   Do not drive or use heavy machinery while taking prescription pain medicine.  Activity  Avoid activities that take a lot of effort, including exercise, for at least 7 days after your procedure.  For the first 3 days, avoid sitting for longer than one hour at a time.   Avoid alcoholic beverages, signing paperwork, or participating in legal proceedings for 24 hours after receiving sedation  Do not lift anything that is heavier than 10 lb (4.5 kg) for one week.   No sexual activity for 1 week.   Return to your normal activities as told by your health care provider. Ask your health care provider what activities are safe for you. General instructions  Take over-the-counter and prescription medicines only as told by your health care provider.  Do not use any products that contain nicotine or tobacco, such as cigarettes and e-cigarettes. If you need help quitting, ask your health care provider.  You  may shower after 24 hours, but Do not take baths, swim, or use a hot tub for 1 week.   Do not drink alcohol for 24 hours after your procedure.  Keep all follow-up visits as told by your health care provider. This is important.  Dental Work: You will require antibiotics prior to any dental work, including cleanings, for 6 months after your Watchman implantation to help protect you from infection. After 6 months, antibiotics are no longer  required. Contact a health care provider if:  You have redness, mild swelling, or pain around your puncture site.  You have soreness in your throat or at your puncture site that does not improve after several days  You have fluid or blood coming from your puncture site that stops after applying firm pressure to the area.  Your puncture site feels warm to the touch.  You have pus or a bad smell coming from your puncture site.  You have a fever.  You have chest pain or discomfort that spreads to your neck, jaw, or arm.  You are sweating a lot.  You feel nauseous.  You have a fast or irregular heartbeat.  You have shortness of breath.  You are dizzy or light-headed and feel the need to lie down.  You have pain or numbness in the arm or leg closest to your puncture site. Get help right away if:  Your puncture site suddenly swells.  Your puncture site is bleeding and the bleeding does not stop after applying firm pressure to the area. These symptoms may represent a serious problem that is an emergency. Do not wait to see if the symptoms will go away. Get medical help right away. Call your local emergency services (911 in the [place] Do not drive yourself to the hospital. Summary  After the procedure, it is normal to have bruising and tenderness at the puncture site in your groin, neck, or forearm.  Check your puncture site every day for signs of infection.  Get help right away if your puncture site is bleeding and the bleeding does not stop after applying firm pressure to the area. This is a medical emergency.  This information is not intended to replace advice given to you by your health care provider. Make sure you discuss any questions you have with your health care provider.    

## 2020-07-28 NOTE — Plan of Care (Signed)
  Problem: Education: Goal: Knowledge of General Education information will improve Description: Including pain rating scale, medication(s)/side effects and non-pharmacologic comfort measures 07/28/2020 1112 by Thressa Sheller, RN Outcome: Adequate for Discharge 07/28/2020 0830 by Thressa Sheller, RN Outcome: Progressing   Problem: Health Behavior/Discharge Planning: Goal: Ability to manage health-related needs will improve Outcome: Adequate for Discharge   Problem: Clinical Measurements: Goal: Ability to maintain clinical measurements within normal limits will improve Outcome: Adequate for Discharge Goal: Will remain free from infection 07/28/2020 1112 by Thressa Sheller, RN Outcome: Adequate for Discharge 07/28/2020 0830 by Thressa Sheller, RN Outcome: Progressing Goal: Diagnostic test results will improve Outcome: Adequate for Discharge Goal: Respiratory complications will improve Outcome: Adequate for Discharge Goal: Cardiovascular complication will be avoided 07/28/2020 1112 by Thressa Sheller, RN Outcome: Adequate for Discharge 07/28/2020 0830 by Thressa Sheller, RN Outcome: Progressing   Problem: Activity: Goal: Risk for activity intolerance will decrease 07/28/2020 1112 by Thressa Sheller, RN Outcome: Adequate for Discharge 07/28/2020 0830 by Thressa Sheller, RN Outcome: Progressing   Problem: Nutrition: Goal: Adequate nutrition will be maintained 07/28/2020 1112 by Thressa Sheller, RN Outcome: Adequate for Discharge 07/28/2020 0830 by Thressa Sheller, RN Outcome: Progressing   Problem: Coping: Goal: Level of anxiety will decrease Outcome: Adequate for Discharge   Problem: Elimination: Goal: Will not experience complications related to bowel motility Outcome: Adequate for Discharge Goal: Will not experience complications related to urinary retention Outcome: Adequate for Discharge   Problem: Pain Managment: Goal: General experience of  comfort will improve Outcome: Adequate for Discharge   Problem: Safety: Goal: Ability to remain free from injury will improve Outcome: Adequate for Discharge   Problem: Skin Integrity: Goal: Risk for impaired skin integrity will decrease Outcome: Adequate for Discharge

## 2020-07-31 ENCOUNTER — Telehealth: Payer: Self-pay | Admitting: Nurse Practitioner

## 2020-07-31 ENCOUNTER — Telehealth: Payer: Self-pay | Admitting: Cardiology

## 2020-07-31 NOTE — Telephone Encounter (Signed)
See phone note created by [name] nurse navigator.

## 2020-07-31 NOTE — Telephone Encounter (Signed)
   Post Watchman follow up call made to Derrick White this morning. .Patient reports no complications or concern at this time. We did discuss an upcoming dental appointment and the need for antibiotics. I advised that he will need antibiotics for any dental work within the first 6 month following the implant. We reviewed [facility] medication regimen and also discussed upcoming 45 day TEE appointment. Patient agrees with treatment regimen and next steps in the Watchman process. Contact information provided and instructed to call me with any question before TEE appointment.    [name], RN Watchman Coordinator

## 2020-07-31 NOTE — Telephone Encounter (Signed)
Wife of the patient called. The patient is scheduled to have a cleaning 08/02/20. He just had a watchman placed 07/27/20. The wife was not sure if he could keep this dental appointment or if he needed to reschedule. Please advise

## 2020-07-31 NOTE — Telephone Encounter (Signed)
Call returned regarding upcoming dental appointment and patient advised to cancel his appointment and reschedule after 6 month period per Dr. Quentin Ore. Patient agrees with plan and encouraged to call with any additonal questions.  [name], RN Watchman Coordinator

## 2020-08-04 ENCOUNTER — Other Ambulatory Visit: Payer: Self-pay | Admitting: Internal Medicine

## 2020-08-04 NOTE — Telephone Encounter (Signed)
Eliquis 5mg  refill request received. Patient is 80 years old, weight-82.2kg, Crea-1.17 on 07/28/2020, Diagnosis-Afib, and last seen by [name], PA on 07/20/20. Dose is appropriate based on dosing criteria. Will send in refill to requested pharmacy.

## 2020-08-10 DIAGNOSIS — M5136 Other intervertebral disc degeneration, lumbar region: Secondary | ICD-10-CM | POA: Diagnosis not present

## 2020-08-10 DIAGNOSIS — G4700 Insomnia, unspecified: Secondary | ICD-10-CM | POA: Diagnosis not present

## 2020-08-10 DIAGNOSIS — G9050 Complex regional pain syndrome I, unspecified: Secondary | ICD-10-CM | POA: Diagnosis not present

## 2020-08-10 DIAGNOSIS — R1312 Dysphagia, oropharyngeal phase: Secondary | ICD-10-CM | POA: Diagnosis not present

## 2020-08-10 DIAGNOSIS — E7800 Pure hypercholesterolemia, unspecified: Secondary | ICD-10-CM | POA: Diagnosis not present

## 2020-08-10 DIAGNOSIS — G3184 Mild cognitive impairment, so stated: Secondary | ICD-10-CM | POA: Diagnosis not present

## 2020-08-10 DIAGNOSIS — Z1339 Encounter for screening examination for other mental health and behavioral disorders: Secondary | ICD-10-CM | POA: Diagnosis not present

## 2020-08-10 DIAGNOSIS — Z1331 Encounter for screening for depression: Secondary | ICD-10-CM | POA: Diagnosis not present

## 2020-08-10 DIAGNOSIS — G119 Hereditary ataxia, unspecified: Secondary | ICD-10-CM | POA: Diagnosis not present

## 2020-08-10 DIAGNOSIS — N401 Enlarged prostate with lower urinary tract symptoms: Secondary | ICD-10-CM | POA: Diagnosis not present

## 2020-08-10 DIAGNOSIS — G5732 Lesion of lateral popliteal nerve, left lower limb: Secondary | ICD-10-CM | POA: Diagnosis not present

## 2020-08-14 NOTE — Telephone Encounter (Signed)
Received results via fax from [facility]. Gave to MD to review.

## 2020-08-15 DIAGNOSIS — H25013 Cortical age-related cataract, bilateral: Secondary | ICD-10-CM | POA: Diagnosis not present

## 2020-08-15 DIAGNOSIS — H5203 Hypermetropia, bilateral: Secondary | ICD-10-CM | POA: Diagnosis not present

## 2020-08-15 DIAGNOSIS — H04122 Dry eye syndrome of left lacrimal gland: Secondary | ICD-10-CM | POA: Diagnosis not present

## 2020-08-15 DIAGNOSIS — H2513 Age-related nuclear cataract, bilateral: Secondary | ICD-10-CM | POA: Diagnosis not present

## 2020-08-25 ENCOUNTER — Telehealth: Payer: Self-pay | Admitting: Neurology

## 2020-08-25 NOTE — Telephone Encounter (Signed)
I reviewed the results of the genetic testing.  He does not have any pathological genes.  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  One was in the [place].  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  Pathology is caused by CAG expansion.  This mutation would not cause that to occur and is unlikely to be playing a role in his ataxia.  Of note, some point mutations (not the one he has) are seen with people with episodic ataxia and familial hemiplegic migraine but he does not have that phenotype.  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  He does not have a known pathologic mutation.  Furthermore he does not have the appropriate phenotype.  Therefore, I do not think that this VUS is playing a role in his symptoms.

## 2020-08-28 ENCOUNTER — Telehealth: Payer: Self-pay | Admitting: *Deleted

## 2020-08-28 ENCOUNTER — Encounter (HOSPITAL_COMMUNITY): Payer: Self-pay | Admitting: Physician Assistant

## 2020-08-28 ENCOUNTER — Other Ambulatory Visit: Payer: Self-pay

## 2020-08-28 ENCOUNTER — Ambulatory Visit (HOSPITAL_COMMUNITY)
Admission: RE | Admit: 2020-08-28 | Discharge: 2020-08-28 | Disposition: A | Discharge status: Home / Self Care | Payer: Medicare Other | Source: Ambulatory Visit | Attending: Physician Assistant | Admitting: Physician Assistant

## 2020-08-28 VITALS — BP 118/88 | HR 84 | Ht 70.0 in | Wt 179.2 lb

## 2020-08-28 DIAGNOSIS — I10 Essential (primary) hypertension: Secondary | ICD-10-CM | POA: Diagnosis not present

## 2020-08-28 DIAGNOSIS — I4819 Other persistent atrial fibrillation: Secondary | ICD-10-CM

## 2020-08-28 DIAGNOSIS — D6869 Other thrombophilia: Secondary | ICD-10-CM

## 2020-08-28 DIAGNOSIS — Z87891 Personal history of nicotine dependence: Secondary | ICD-10-CM | POA: Insufficient documentation

## 2020-08-28 DIAGNOSIS — Z7901 Long term (current) use of anticoagulants: Secondary | ICD-10-CM | POA: Diagnosis not present

## 2020-08-28 LAB — CBC
HCT: 44.7 % (ref 39.0–52.0)
Hemoglobin: 14.6 g/dL (ref 13.0–17.0)
MCH: 32.4 pg (ref 26.0–34.0)
MCHC: 32.7 g/dL (ref 30.0–36.0)
MCV: 99.1 fL (ref 80.0–100.0)
Platelets: 282 10*3/uL (ref 150–400)
RBC: 4.51 MIL/uL (ref 4.22–5.81)
RDW: 11.9 % (ref 11.5–15.5)
WBC: 7.6 10*3/uL (ref 4.0–10.5)
nRBC: 0 % (ref 0.0–0.2)

## 2020-08-28 LAB — BASIC METABOLIC PANEL
Anion gap: 10 (ref 5–15)
BUN: 16 mg/dL (ref 8–23)
CO2: 24 mmol/L (ref 22–32)
Calcium: 9.4 mg/dL (ref 8.9–10.3)
Chloride: 104 mmol/L (ref 98–111)
Creatinine, Ser: 1.02 mg/dL (ref 0.61–1.24)
GFR, Estimated: >60 mL/min (ref >60)
Glucose, Bld: 107 mg/dL — ABNORMAL HIGH (ref 70–99)
Potassium: 5.9 mmol/L — ABNORMAL HIGH (ref 3.5–5.1)
Sodium: 138 mmol/L (ref 135–145)

## 2020-08-28 NOTE — H&P (View-Only) (Signed)
Primary Care Physician: Haywood Pao, MD Primary Cardiologist: Dr Harrington Challenger Primary Electrophysiologist: Dr Quentin Ore Referring Physician: Dr Quentin Ore   Derrick White is a 80 y.o. male with a history of prior CVA, cerebellar ataxia, and atrial fibrillation who presents for follow up in the [facility]. Patient is on Eliquis for a CHADS2VASC score of 5. Patient was seen by Dr Quentin Ore for [facility] device consideration. He is not felt to be a candidate for long term anticoagulation due to his high fall risk with cerebellar ataxia. He is scheduled for Watchman implant on 07/27/20.   On follow up today, patient is s/p Watchman implant 07/27/20. Patient reports that over the last several weeks he has felt increasingly fatigued with exertion. He denies any CP, cough, fever, or blood in urine or stool. He remains in rate controlled afib.   Today, he denies symptoms of palpitations, chest pain, shortness of breath, orthopnea, PND, lower extremity edema, dizziness, presyncope, syncope, snoring, daytime somnolence, bleeding. The patient is tolerating medications without difficulties and is otherwise without complaint today.    Atrial Fibrillation Risk Factors:  he does not have symptoms or diagnosis of sleep apnea. he does not have a history of rheumatic fever.   he has a BMI of Body mass index is 25.71 kg/m.Marland Kitchen Filed Weights   08/28/20 1112  Weight: 81.3 kg    Family History  Problem Relation Age of Onset  . Stroke Neg Hx      Atrial Fibrillation Management history:  Previous antiarrhythmic drugs: none Previous cardioversions: none Previous ablations: none CHADS2VASC score: 5 Anticoagulation history: Eliquis   Past Medical History:  Diagnosis Date  . C6 radiculopathy 05/04/2019  . Cerebellar ataxia ([place])   . Dysesthesia 06/04/2016  . Essential hypertension 11/05/2019  . Foot pain, 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  . Gait disturbance 03/16/2019  . History of  cervical spinal surgery 06/04/2016  . History of hiatal hernia   . History of kidney stones   . Hypertension   . 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 Memory loss 03/16/2019  . 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  . Pain due to onychomycosis of toenails of both feet 04/28/2019  . Pain in left ankle and joints of left foot 08/06/2018  . Slurred speech 03/16/2019  . 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 TIA (transient ischemic attack) 11/05/2019  . Worsened handwriting 03/16/2019   Past Surgical History:  Procedure Laterality Date  . APPENDECTOMY     1940s  . [place]  . FRACTURE SURGERY     Left ankle, sx x4  . INGUINAL HERNIA REPAIR Right 03/05/2019   Procedure: OPEN RIGHT INGUINAL HERNIA REPAIR WITH MESH;  Surgeon: Clovis Riley, MD;  Location: [place];  Service: General;  Laterality: Right;  . KNEE SURGERY Left 1985  . LEFT ATRIAL APPENDAGE OCCLUSION N/A 07/27/2020   Procedure: LEFT ATRIAL APPENDAGE OCCLUSION;  Surgeon: Sherren Mocha, MD;  Location: [place] [facility];  Service: Cardiovascular;  Laterality: N/A;  . SHOULDER SURGERY Left   . TEE WITHOUT CARDIOVERSION N/A 07/27/2020   Procedure: TRANSESOPHAGEAL ECHOCARDIOGRAM (TEE);  Surgeon: Sherren Mocha, MD;  Location: [place] [facility];  Service: Cardiovascular;  Laterality: N/A;  . TONSILLECTOMY     1945    Current Outpatient Medications  Medication Sig Dispense Refill  . acetaminophen (TYLENOL) 500 MG tablet Take 500 mg by mouth every 6 (six) hours as needed for moderate pain or headache.    Marland Kitchen  aspirin EC 81 MG tablet Take 1 tablet (81 mg total) by mouth daily. Swallow whole. 30 tablet 11  . atorvastatin (LIPITOR) 40 MG tablet Take 1 tablet (40 mg total) by mouth daily. 90 tablet 3  . Cholecalciferol 25 MCG (1000 UT) tablet Take 1,000 Units by mouth every evening.    . clotrimazole (LOTRIMIN) 1 % cream Apply 1 application topically 2 (two) times daily as needed (skin  irritation).    . cyanocobalamin 1000 MCG tablet Take 1,000 mcg by mouth every evening.    Marland Kitchen ELIQUIS 5 MG TABS tablet TAKE 1 TABLET TWICE A DAY 180 tablet 1  . fluticasone (FLONASE) 50 MCG/ACT nasal spray Place 1 spray into both nostrils daily.    . Homeopathic Products (LEG CRAMPS PM SL) Take 1 tablet by mouth at bedtime. Legatrin PM    . oxybutynin (DITROPAN-XL) 10 MG 24 hr tablet Take 10 mg by mouth daily.    . pantoprazole (PROTONIX) 40 MG tablet Take 40 mg by mouth daily.   2  . Probiotic Product (ALIGN PO) Take 1 capsule by mouth daily.    Marland Kitchen Propylene Glycol (SYSTANE BALANCE) 0.6 % SOLN Place 1 drop into both eyes daily as needed (burning).    Marland Kitchen telmisartan (MICARDIS) 80 MG tablet Take 80 mg by mouth daily.      No current facility-administered medications for this encounter.    Allergies  Allergen Reactions  . Morphine Hives and Itching  . Codeine Hives and Itching  . Propoxyphene Itching    DARVOCET  . Shrimp [Shellfish Allergy] Rash    Social History   Socioeconomic History  . Marital status: Married    Spouse name: Not on file  . Number of children: Not on file  . Years of education: Not on file  . Highest education level: Not on file  Occupational History  . Occupation: retired  Tobacco Use  . Smoking status: Former Smoker    Packs/day: 1.00    Years: 30.00    Pack years: 30.00    Types: Cigarettes    Quit date: 1995    Years since quitting: 27.2  . Smokeless tobacco: Never Used  Vaping Use  . Vaping Use: Never used  Substance and Sexual Activity  . Alcohol use: Yes    Alcohol/week: 14.0 standard drinks    Types: 14 Cans of beer per week    Comment: 2 beers a day  . Drug use: Never  . Sexual activity: Not on file  Other Topics Concern  . Not on file  Social History Narrative  . Not on file   Social Determinants of Health   Financial Resource Strain: Not on file  Food Insecurity: Not on file  Transportation Needs: Not on file  Physical Activity:  Not on file  Stress: Not on file  Social Connections: Not on file  Intimate Partner Violence: Not on file     ROS- All systems are reviewed and negative except as per the HPI above.  Physical Exam: Vitals:   08/28/20 1112  BP: 118/88  Pulse: 84  Weight: 81.3 kg  Height: 5\' 10"  (1.778 m)    GEN- The patient is a well appearing elderly male, alert and oriented x 3 today.   HEENT-head normocephalic, atraumatic, sclera clear, conjunctiva pink, hearing intact, trachea midline. Lungs- Clear to ausculation bilaterally, normal work of breathing Heart- irregular rate and rhythm, no murmurs, rubs or gallops  GI- soft, NT, ND, + BS Extremities- no clubbing, cyanosis, or  edema MS- no significant deformity or atrophy Skin- no rash or lesion Psych- euthymic mood, full affect Neuro- strength and sensation are intact   Wt Readings from Last 3 Encounters:  08/28/20 81.3 kg  07/28/20 82.2 kg  07/20/20 83 kg    EKG today demonstrates  Afib Vent. rate 84 BPM PR interval * ms QRS duration 84 ms QT/QTcB 368/434 ms  Echo 11/05/19 demonstrated  1. Left ventricular ejection fraction, by estimation, is 60 to 65%. The  left ventricle has normal function. The left ventricle has no regional  wall motion abnormalities. Left ventricular diastolic parameters were  normal.  2. Right ventricular systolic function is normal. The right ventricular  size is normal. There is normal pulmonary artery systolic pressure.  3. The mitral valve is normal in structure. No evidence of mitral valve  regurgitation. No evidence of mitral stenosis.  4. The aortic valve is normal in structure. Aortic valve regurgitation is  not visualized. No aortic stenosis is present.  5. Aortic dilatation noted. There is borderline dilatation of the aortic  root.  6. The inferior vena cava is normal in size with greater than 50%  respiratory variability, suggesting right atrial pressure of 3 mmHg.   Cardiac CT  07/14/20 Large Wind-sock appendage with prominent lateral trabeculation with maximum landing zone diameter 23 mm Suitable for a 27 Watchman FLX device with 14.8% compression  Epic records are reviewed at length today  HAS-BLED score 3 Hypertension Yes  Abnormal renal and liver function (Dialysis, transplant, Cr >2.26 mg/dL /Cirrhosis or Bilirubin >2x Normal or AST/ALT/AP >3x Normal) No  Stroke Yes  Bleeding No  Labile INR (Unstable/high INR) No  Elderly (>65) Yes  Drugs or alcohol (? 8 drinks/week, anti-plt or NSAID) No   CHA2DS2-VASc Score = 5  The patient's score is based upon: CHF History: No HTN History: Yes Diabetes History: No Stroke History: Yes Vascular Disease History: No Age Score: 2 Gender Score: 0      ASSESSMENT AND PLAN: 1. Persistent Atrial Fibrillation The patient's CHA2DS2-VASc score is 5, indicating a 7.2% annual risk of stroke.  S/p Watchman implant 07/27/20 Suspect increased fatigue is 2/2 persistent afib. Had previously been paroxysmal. D/w Dr Quentin Ore. Will add DCCV to TEE on 09/13/20. He will need to continue Eliquis 5 mg BID for an additional month even if he meets criteria on TEE. Continue Eliquis 5 mg BID and ASA 81 mg daily Check bmet/CBC Continue metoprolol 50 mg BID  2. 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  Continue Apixaban (Eliquis). See plans above.  3. HTN Stable, no changes today.   Follow up in the AF clinic post DCCV/TEE.   [facility] [address] [place], [place] [address] [phone] 08/28/2020 12:03 PM

## 2020-08-28 NOTE — Telephone Encounter (Signed)
He had no mutations tin his DNA linked to problems that would his symptoms so should not pass a bad Berry on.

## 2020-08-28 NOTE — Patient Instructions (Addendum)
Cardioversion scheduled for Wednesday, April 27th  - Arrive at the [facility] and go to admitting at 730AM  - Do not eat or drink anything after midnight the night prior to your procedure.  - Take all your morning medication (except diabetic medications) with a sip of water prior to arrival.  - You will not be able to drive home after your procedure.  - Do NOT miss any doses of your blood thinner - if you should miss a dose please notify our office immediately.  - If you feel as if you go back into normal rhythm prior to scheduled cardioversion, please notify our office immediately. If your procedure is canceled in the cardioversion suite you will be charged a cancellation fee.

## 2020-08-28 NOTE — Telephone Encounter (Signed)
Called and spoke with wife (husband at appt). Relayed results per Dr. Garth Bigness note. She verbalized understanding. However, she wants to know if there is anything seen on genetic testing that he could pass down to grandchildren. Advised I will speak w/ MD and call back.

## 2020-08-28 NOTE — Telephone Encounter (Signed)
LVM relaying Dr. Garth Bigness message. Advised them to call back if they have any further questions/concerns.

## 2020-08-28 NOTE — Progress Notes (Signed)
Primary Care Physician: Haywood Pao, MD Primary Cardiologist: Dr Harrington Challenger Primary Electrophysiologist: Dr Quentin Ore Referring Physician: Dr Quentin Ore   Derrick White is a 80 y.o. male with a history of prior CVA, cerebellar ataxia, and atrial fibrillation who presents for follow up in the [facility]. Patient is on Eliquis for a CHADS2VASC score of 5. Patient was seen by Dr Quentin Ore for [facility] device consideration. He is not felt to be a candidate for long term anticoagulation due to his high fall risk with cerebellar ataxia. He is scheduled for Watchman implant on 07/27/20.   On follow up today, patient is s/p Watchman implant 07/27/20. Patient reports that over the last several weeks he has felt increasingly fatigued with exertion. He denies any CP, cough, fever, or blood in urine or stool. He remains in rate controlled afib.   Today, he denies symptoms of palpitations, chest pain, shortness of breath, orthopnea, PND, lower extremity edema, dizziness, presyncope, syncope, snoring, daytime somnolence, bleeding. The patient is tolerating medications without difficulties and is otherwise without complaint today.    Atrial Fibrillation Risk Factors:  he does not have symptoms or diagnosis of sleep apnea. he does not have a history of rheumatic fever.   he has a BMI of Body mass index is 25.71 kg/m.Marland Kitchen Filed Weights   08/28/20 1112  Weight: 81.3 kg    Family History  Problem Relation Age of Onset  . Stroke Neg Hx      Atrial Fibrillation Management history:  Previous antiarrhythmic drugs: none Previous cardioversions: none Previous ablations: none CHADS2VASC score: 5 Anticoagulation history: Eliquis   Past Medical History:  Diagnosis Date  . C6 radiculopathy 05/04/2019  . Cerebellar ataxia ([place])   . Dysesthesia 06/04/2016  . Essential hypertension 11/05/2019  . Foot pain, 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  . Gait disturbance 03/16/2019  . History of  cervical spinal surgery 06/04/2016  . History of hiatal hernia   . History of kidney stones   . Hypertension   . 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 Memory loss 03/16/2019  . 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  . Pain due to onychomycosis of toenails of both feet 04/28/2019  . Pain in left ankle and joints of left foot 08/06/2018  . Slurred speech 03/16/2019  . 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 TIA (transient ischemic attack) 11/05/2019  . Worsened handwriting 03/16/2019   Past Surgical History:  Procedure Laterality Date  . APPENDECTOMY     1940s  . [place]  . FRACTURE SURGERY     Left ankle, sx x4  . INGUINAL HERNIA REPAIR Right 03/05/2019   Procedure: OPEN RIGHT INGUINAL HERNIA REPAIR WITH MESH;  Surgeon: Clovis Riley, MD;  Location: [facility];  Service: General;  Laterality: Right;  . KNEE SURGERY Left 1985  . LEFT ATRIAL APPENDAGE OCCLUSION N/A 07/27/2020   Procedure: LEFT ATRIAL APPENDAGE OCCLUSION;  Surgeon: Sherren Mocha, MD;  Location: [facility];  Service: Cardiovascular;  Laterality: N/A;  . SHOULDER SURGERY Left   . TEE WITHOUT CARDIOVERSION N/A 07/27/2020   Procedure: TRANSESOPHAGEAL ECHOCARDIOGRAM (TEE);  Surgeon: Sherren Mocha, MD;  Location: [facility];  Service: Cardiovascular;  Laterality: N/A;  . TONSILLECTOMY     1945    Current Outpatient Medications  Medication Sig Dispense Refill  . acetaminophen (TYLENOL) 500 MG tablet Take 500 mg by mouth every 6 (six) hours as needed for moderate pain or headache.    Marland Kitchen  aspirin EC 81 MG tablet Take 1 tablet (81 mg total) by mouth daily. Swallow whole. 30 tablet 11  . atorvastatin (LIPITOR) 40 MG tablet Take 1 tablet (40 mg total) by mouth daily. 90 tablet 3  . Cholecalciferol 25 MCG (1000 UT) tablet Take 1,000 Units by mouth every evening.    . clotrimazole (LOTRIMIN) 1 % cream Apply 1 application topically 2 (two) times daily as needed (skin  irritation).    . cyanocobalamin 1000 MCG tablet Take 1,000 mcg by mouth every evening.    Marland Kitchen ELIQUIS 5 MG TABS tablet TAKE 1 TABLET TWICE A DAY 180 tablet 1  . fluticasone (FLONASE) 50 MCG/ACT nasal spray Place 1 spray into both nostrils daily.    . Homeopathic Products (LEG CRAMPS PM SL) Take 1 tablet by mouth at bedtime. Legatrin PM    . oxybutynin (DITROPAN-XL) 10 MG 24 hr tablet Take 10 mg by mouth daily.    . pantoprazole (PROTONIX) 40 MG tablet Take 40 mg by mouth daily.   2  . Probiotic Product (ALIGN PO) Take 1 capsule by mouth daily.    Marland Kitchen Propylene Glycol (SYSTANE BALANCE) 0.6 % SOLN Place 1 drop into both eyes daily as needed (burning).    Marland Kitchen telmisartan (MICARDIS) 80 MG tablet Take 80 mg by mouth daily.      No current facility-administered medications for this encounter.    Allergies  Allergen Reactions  . Morphine Hives and Itching  . Codeine Hives and Itching  . Propoxyphene Itching    DARVOCET  . Shrimp [Shellfish Allergy] Rash    Social History   Socioeconomic History  . Marital status: Married    Spouse name: Not on file  . Number of children: Not on file  . Years of education: Not on file  . Highest education level: Not on file  Occupational History  . Occupation: retired  Tobacco Use  . Smoking status: Former Smoker    Packs/day: 1.00    Years: 30.00    Pack years: 30.00    Types: Cigarettes    Quit date: 1995    Years since quitting: 27.2  . Smokeless tobacco: Never Used  Vaping Use  . Vaping Use: Never used  Substance and Sexual Activity  . Alcohol use: Yes    Alcohol/week: 14.0 standard drinks    Types: 14 Cans of beer per week    Comment: 2 beers a day  . Drug use: Never  . Sexual activity: Not on file  Other Topics Concern  . Not on file  Social History Narrative  . Not on file   Social Determinants of Health   Financial Resource Strain: Not on file  Food Insecurity: Not on file  Transportation Needs: Not on file  Physical Activity:  Not on file  Stress: Not on file  Social Connections: Not on file  Intimate Partner Violence: Not on file     ROS- All systems are reviewed and negative except as per the HPI above.  Physical Exam: Vitals:   08/28/20 1112  BP: 118/88  Pulse: 84  Weight: 81.3 kg  Height: 5\' 10"  (1.778 m)    GEN- The patient is a well appearing elderly male, alert and oriented x 3 today.   HEENT-head normocephalic, atraumatic, sclera clear, conjunctiva pink, hearing intact, trachea midline. Lungs- Clear to ausculation bilaterally, normal work of breathing Heart- irregular rate and rhythm, no murmurs, rubs or gallops  GI- soft, NT, ND, + BS Extremities- no clubbing, cyanosis, or  edema MS- no significant deformity or atrophy Skin- no rash or lesion Psych- euthymic mood, full affect Neuro- strength and sensation are intact   Wt Readings from Last 3 Encounters:  08/28/20 81.3 kg  07/28/20 82.2 kg  07/20/20 83 kg    EKG today demonstrates  Afib Vent. rate 84 BPM PR interval * ms QRS duration 84 ms QT/QTcB 368/434 ms  Echo 11/05/19 demonstrated  1. Left ventricular ejection fraction, by estimation, is 60 to 65%. The  left ventricle has normal function. The left ventricle has no regional  wall motion abnormalities. Left ventricular diastolic parameters were  normal.  2. Right ventricular systolic function is normal. The right ventricular  size is normal. There is normal pulmonary artery systolic pressure.  3. The mitral valve is normal in structure. No evidence of mitral valve  regurgitation. No evidence of mitral stenosis.  4. The aortic valve is normal in structure. Aortic valve regurgitation is  not visualized. No aortic stenosis is present.  5. Aortic dilatation noted. There is borderline dilatation of the aortic  root.  6. The inferior vena cava is normal in size with greater than 50%  respiratory variability, suggesting right atrial pressure of 3 mmHg.   Cardiac CT  07/14/20 Large Wind-sock appendage with prominent lateral trabeculation with maximum landing zone diameter 23 mm Suitable for a 27 Watchman FLX device with 14.8% compression  Epic records are reviewed at length today  HAS-BLED score 3 Hypertension Yes  Abnormal renal and liver function (Dialysis, transplant, Cr >2.26 mg/dL /Cirrhosis or Bilirubin >2x Normal or AST/ALT/AP >3x Normal) No  Stroke Yes  Bleeding No  Labile INR (Unstable/high INR) No  Elderly (>65) Yes  Drugs or alcohol (? 8 drinks/week, anti-plt or NSAID) No   CHA2DS2-VASc Score = 5  The patient's score is based upon: CHF History: No HTN History: Yes Diabetes History: No Stroke History: Yes Vascular Disease History: No Age Score: 2 Gender Score: 0      ASSESSMENT AND PLAN: 1. Persistent Atrial Fibrillation The patient's CHA2DS2-VASc score is 5, indicating a 7.2% annual risk of stroke.  S/p Watchman implant 07/27/20 Suspect increased fatigue is 2/2 persistent afib. Had previously been paroxysmal. D/w Dr Quentin Ore. Will add DCCV to TEE on 09/13/20. He will need to continue Eliquis 5 mg BID for an additional month even if he meets criteria on TEE. Continue Eliquis 5 mg BID and ASA 81 mg daily Check bmet/CBC Continue metoprolol 50 mg BID  2. 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  Continue Apixaban (Eliquis). See plans above.  3. HTN Stable, no changes today.   Follow up in the AF clinic post DCCV/TEE.   [facility] [address] [place], [place] [address] [phone] 08/28/2020 12:03 PM

## 2020-08-28 NOTE — Telephone Encounter (Signed)
-----   Message from Britt Bottom, MD sent at 08/25/2020 11:58 AM EDT ----- Regarding: Genetic testing results Please let the patient know that we got the results back for the genetic testing for the many different types of spinocerebellar ataxia.    He does not have a mutation that would cause ataxia.

## 2020-09-04 ENCOUNTER — Ambulatory Visit (HOSPITAL_COMMUNITY): Payer: Medicare Other | Admitting: Physician Assistant

## 2020-09-11 ENCOUNTER — Other Ambulatory Visit (HOSPITAL_COMMUNITY)
Admission: RE | Admit: 2020-09-11 | Discharge: 2020-09-11 | Disposition: A | Discharge status: Home / Self Care | Payer: Medicare Other | Source: Ambulatory Visit | Attending: Cardiovascular Disease | Admitting: Cardiovascular Disease

## 2020-09-11 DIAGNOSIS — Z20822 Contact with and (suspected) exposure to covid-19: Secondary | ICD-10-CM | POA: Insufficient documentation

## 2020-09-11 DIAGNOSIS — Z01812 Encounter for preprocedural laboratory examination: Secondary | ICD-10-CM | POA: Diagnosis not present

## 2020-09-12 LAB — SARS CORONAVIRUS 2 (TAT 6-24 HRS): SARS Coronavirus 2: NEGATIVE

## 2020-09-13 ENCOUNTER — Ambulatory Visit (HOSPITAL_COMMUNITY): Payer: Medicare Other | Admitting: Certified Registered Nurse Anesthetist

## 2020-09-13 ENCOUNTER — Encounter (HOSPITAL_COMMUNITY)
Admission: RE | Disposition: A | Discharge status: Home / Self Care | Payer: Self-pay | Source: Home / Self Care | Attending: Cardiovascular Disease

## 2020-09-13 ENCOUNTER — Ambulatory Visit (HOSPITAL_BASED_OUTPATIENT_CLINIC_OR_DEPARTMENT_OTHER)
Admission: RE | Admit: 2020-09-13 | Discharge: 2020-09-13 | Disposition: A | Discharge status: Home / Self Care | Payer: Medicare Other | Source: Ambulatory Visit | Attending: Cardiology | Admitting: Cardiology

## 2020-09-13 ENCOUNTER — Other Ambulatory Visit: Payer: Self-pay

## 2020-09-13 ENCOUNTER — Ambulatory Visit (HOSPITAL_COMMUNITY)
Admission: RE | Admit: 2020-09-13 | Discharge: 2020-09-13 | Disposition: A | Discharge status: Home / Self Care | Payer: Medicare Other | Attending: Cardiovascular Disease | Admitting: Cardiovascular Disease

## 2020-09-13 DIAGNOSIS — Q211 Atrial septal defect: Secondary | ICD-10-CM | POA: Diagnosis not present

## 2020-09-13 DIAGNOSIS — I480 Paroxysmal atrial fibrillation: Secondary | ICD-10-CM | POA: Diagnosis not present

## 2020-09-13 DIAGNOSIS — I4891 Unspecified atrial fibrillation: Secondary | ICD-10-CM

## 2020-09-13 DIAGNOSIS — Z886 Allergy status to analgesic agent status: Secondary | ICD-10-CM | POA: Insufficient documentation

## 2020-09-13 DIAGNOSIS — Z885 Allergy status to narcotic agent status: Secondary | ICD-10-CM | POA: Diagnosis not present

## 2020-09-13 DIAGNOSIS — I10 Essential (primary) hypertension: Secondary | ICD-10-CM | POA: Insufficient documentation

## 2020-09-13 DIAGNOSIS — Z79899 Other long term (current) drug therapy: Secondary | ICD-10-CM | POA: Insufficient documentation

## 2020-09-13 DIAGNOSIS — I4819 Other persistent atrial fibrillation: Secondary | ICD-10-CM | POA: Diagnosis not present

## 2020-09-13 DIAGNOSIS — Z8673 Personal history of transient ischemic attack (TIA), and cerebral infarction without residual deficits: Secondary | ICD-10-CM | POA: Diagnosis not present

## 2020-09-13 DIAGNOSIS — Z7982 Long term (current) use of aspirin: Secondary | ICD-10-CM | POA: Diagnosis not present

## 2020-09-13 DIAGNOSIS — Z87891 Personal history of nicotine dependence: Secondary | ICD-10-CM | POA: Insufficient documentation

## 2020-09-13 DIAGNOSIS — M5417 Radiculopathy, lumbosacral region: Secondary | ICD-10-CM | POA: Diagnosis not present

## 2020-09-13 DIAGNOSIS — Z95811 Presence of heart assist device: Secondary | ICD-10-CM

## 2020-09-13 DIAGNOSIS — Z7901 Long term (current) use of anticoagulants: Secondary | ICD-10-CM | POA: Diagnosis not present

## 2020-09-13 DIAGNOSIS — K219 Gastro-esophageal reflux disease without esophagitis: Secondary | ICD-10-CM | POA: Diagnosis not present

## 2020-09-13 DIAGNOSIS — Z91013 Allergy to seafood: Secondary | ICD-10-CM | POA: Insufficient documentation

## 2020-09-13 HISTORY — PX: CARDIOVERSION: SHX1299

## 2020-09-13 HISTORY — PX: TEE WITHOUT CARDIOVERSION: SHX5443

## 2020-09-13 LAB — POCT I-STAT, CHEM 8
BUN: 21 mg/dL (ref 8–23)
Calcium, Ion: 1.15 mmol/L (ref 1.15–1.40)
Chloride: 106 mmol/L (ref 98–111)
Creatinine, Ser: 1 mg/dL (ref 0.61–1.24)
Glucose, Bld: 99 mg/dL (ref 70–99)
HCT: 41 % (ref 39.0–52.0)
Hemoglobin: 13.9 g/dL (ref 13.0–17.0)
Potassium: 4.1 mmol/L (ref 3.5–5.1)
Sodium: 141 mmol/L (ref 135–145)
TCO2: 24 mmol/L (ref 22–32)

## 2020-09-13 SURGERY — ECHOCARDIOGRAM, TRANSESOPHAGEAL
Anesthesia: Monitor Anesthesia Care

## 2020-09-13 MED ORDER — SODIUM CHLORIDE 0.9 % IV SOLN: INTRAVENOUS | Status: DC | PRN

## 2020-09-13 MED ORDER — PROPOFOL 500 MG/50ML IV EMUL
INTRAVENOUS | Status: DC | PRN
  Administered 2020-09-13: 125 ug/kg/min via INTRAVENOUS

## 2020-09-13 MED ORDER — SODIUM CHLORIDE 0.9 % IV SOLN: INTRAVENOUS | Status: DC

## 2020-09-13 MED ORDER — PROPOFOL 10 MG/ML IV BOLUS
INTRAVENOUS | Status: DC | PRN
  Administered 2020-09-13: 20 mg via INTRAVENOUS

## 2020-09-13 NOTE — Discharge Instructions (Signed)

## 2020-09-13 NOTE — Anesthesia Procedure Notes (Signed)
Procedure Name: MAC Date/Time: 09/13/2020 8:59 AM Performed by: Kathryne Hitch, CRNA Pre-anesthesia Checklist: Patient identified, Suction available, Patient being monitored and Emergency Drugs available Patient Re-evaluated:Patient Re-evaluated prior to induction Oxygen Delivery Method: Nasal cannula Preoxygenation: Pre-oxygenation with 100% oxygen Induction Type: IV induction Placement Confirmation: positive ETCO2 Dental Injury: Teeth and Oropharynx as per pre-operative assessment

## 2020-09-13 NOTE — Interval H&P Note (Signed)
History and Physical Interval Note:  09/13/2020 7:46 AM  Derrick White  has presented today for surgery, with the diagnosis of POST WATCHMAN IMPLANT.  The various methods of treatment have been discussed with the patient and family. After consideration of risks, benefits and other options for treatment, the patient has consented to  Procedure(s): TRANSESOPHAGEAL ECHOCARDIOGRAM (TEE) (N/A) CARDIOVERSION (N/A) as a surgical intervention.  The patient's history has been reviewed, patient examined, no change in status, stable for surgery.  I have reviewed the patient's chart and labs.  Questions were answered to the patient's satisfaction.     Chenae Brager

## 2020-09-13 NOTE — Transfer of Care (Signed)
Immediate Anesthesia Transfer of Care Note  Patient: Derrick White  Procedure(s) Performed: TRANSESOPHAGEAL ECHOCARDIOGRAM (TEE) (N/A ) CARDIOVERSION (N/A )  Patient Location: Endoscopy Unit  Anesthesia Type:MAC  Level of Consciousness: drowsy and patient cooperative  Airway & Oxygen Therapy: Patient Spontanous Breathing  Post-op Assessment: Report given to RN and Post -op Vital signs reviewed and stable  Post vital signs: Reviewed and stable  Last Vitals:  Vitals Value Taken Time  BP 119/72   Temp    Pulse 84   Resp 17   SpO2 93     Last Pain:  Vitals:   09/13/20 0800  TempSrc: Temporal  PainSc: 0-No pain         Complications: No complications documented.

## 2020-09-13 NOTE — Anesthesia Preprocedure Evaluation (Addendum)
Anesthesia Evaluation  Patient identified by MRN, date of birth, ID band Patient awake    Reviewed: Allergy & Precautions, NPO status , Patient's Chart, lab work & pertinent test results  History of Anesthesia Complications Negative for: history of anesthetic complications  Airway Mallampati: II  TM Distance: >3 FB Neck ROM: Full    Dental  (+) Dental Advisory Given   Pulmonary former smoker,    breath sounds clear to auscultation       Cardiovascular hypertension, Pt. on medications  Rhythm:Irregular     Neuro/Psych TIA Neuromuscular disease negative psych ROS   GI/Hepatic Neg liver ROS, hiatal hernia, GERD  Medicated and Controlled,Lab Results      Component                Value               Date                      ALT                      20                  11/05/2019                AST                      18                  11/05/2019                ALKPHOS                  27 (L)              11/05/2019                BILITOT                  1.1                 11/05/2019              Endo/Other  negative endocrine ROSLab Results      Component                Value               Date                      HGBA1C                   5.6                 11/06/2019             Renal/GU negative Renal ROSLab Results      Component                Value               Date                      CREATININE               1.02                08/28/2020  Musculoskeletal negative musculoskeletal ROS (+)   Abdominal   Peds  Hematology Lab Results      Component                Value               Date                      WBC                      7.6                 08/28/2020                HGB                      14.6                08/28/2020                HCT                      44.7                08/28/2020                MCV                      99.1                08/28/2020                 PLT                      282                 08/28/2020           eliquis    Anesthesia Other Findings   Reproductive/Obstetrics                            Anesthesia Physical Anesthesia Plan  ASA: II  Anesthesia Plan: MAC   Post-op Pain Management:    Induction: Intravenous  PONV Risk Score and Plan: 1 and Treatment may vary due to age or medical condition and Propofol infusion  Airway Management Planned: Nasal Cannula  Additional Equipment: None  Intra-op Plan:   Post-operative Plan:   Informed Consent: I have reviewed the patients History and Physical, chart, labs and discussed the procedure including the risks, benefits and alternatives for the proposed anesthesia with the patient or authorized representative who has indicated his/her understanding and acceptance.     Dental advisory given  Plan Discussed with: CRNA  Anesthesia Plan Comments:         Anesthesia Quick Evaluation

## 2020-09-13 NOTE — Op Note (Signed)
INDICATIONS: Watchman evaluation 45 days; precardioversion AFib  PROCEDURE:   Informed consent was obtained prior to the procedure. The risks, benefits and alternatives for the procedure were discussed and the patient comprehended these risks.  Risks include, but are not limited to, cough, sore throat, vomiting, nausea, somnolence, esophageal and stomach trauma or perforation, bleeding, low blood pressure, aspiration, pneumonia, infection, trauma to the teeth and death.   During this procedure the patient was administered IV propofol by Anesthesiology, Dr. Ermalene Postin  The transesophageal probe was inserted in the esophagus and stomach without difficulty and multiple views were obtained.  The patient was kept under observation until the patient left the procedure room.  The patient left the procedure room in stable condition.   Agitated microbubble saline contrast was not administered.  COMPLICATIONS:    There were no immediate complications.  FINDINGS:  Well seated Watchman without leak or thrombus.  RECOMMENDATIONS:     Proceed with DC cardioversion (see below).  Discontinue anticoagulation per Watchman protocol  Procedure: Electrical Cardioversion Indications:  Atrial Fibrillation  Procedure Details:  Consent: Risks of procedure as well as the alternatives and risks of each were explained to the (patient/caregiver).  Consent for procedure obtained.  Time Out: Verified patient identification, verified procedure, site/side was marked, verified correct patient position, special equipment/implants available, medications/allergies/relevent history reviewed, required imaging and test results available.  Performed  Patient placed on cardiac monitor, pulse oximetry, supplemental oxygen as necessary.  Sedation given: propofol IV Pacer pads placed anterior and posterior chest.  Cardioverted 1 time(s).  Cardioversion with synchronized biphasic 150J shock.  Evaluation: Findings: Post  procedure EKG shows: NSR Complications: None Patient did tolerate procedure well.  Time Spent Directly with the Patient:  60 minutes   Derrick White 09/13/2020, 9:13 AM       Time Spent Directly with the Patient:  60 minutes   Derrick White 09/13/2020, 9:11 AM

## 2020-09-13 NOTE — Progress Notes (Signed)
  Echocardiogram Echocardiogram Transesophageal has been performed.  Derrick White 09/13/2020, 9:31 AM

## 2020-09-14 ENCOUNTER — Encounter (HOSPITAL_COMMUNITY): Payer: Self-pay | Admitting: Cardiovascular Disease

## 2020-09-15 NOTE — Anesthesia Postprocedure Evaluation (Signed)
Anesthesia Post Note  Patient: Derrick White  Procedure(s) Performed: TRANSESOPHAGEAL ECHOCARDIOGRAM (TEE) (N/A ) CARDIOVERSION (N/A )     Patient location during evaluation: Endoscopy Anesthesia Type: General Level of consciousness: awake and alert Pain management: pain level controlled Vital Signs Assessment: post-procedure vital signs reviewed and stable Respiratory status: spontaneous breathing, nonlabored ventilation, respiratory function stable and patient connected to nasal cannula oxygen Cardiovascular status: stable Postop Assessment: no apparent nausea or vomiting Anesthetic complications: no   No complications documented.  Last Vitals:  Vitals:   09/13/20 0915 09/13/20 0924  BP: 119/72 115/74  Pulse: 86 75  Resp: 16 18  Temp: 36.5 C   SpO2: 92% 96%    Last Pain:  Vitals:   09/13/20 0924  TempSrc:   PainSc: 0-No pain                 Braniyah Besse

## 2020-09-18 ENCOUNTER — Ambulatory Visit (HOSPITAL_COMMUNITY): Payer: Medicare Other | Admitting: Physician Assistant

## 2020-09-18 ENCOUNTER — Encounter (HOSPITAL_COMMUNITY): Payer: Self-pay | Admitting: Physician Assistant

## 2020-09-18 ENCOUNTER — Other Ambulatory Visit: Payer: Self-pay

## 2020-09-18 ENCOUNTER — Ambulatory Visit (HOSPITAL_COMMUNITY)
Admission: RE | Admit: 2020-09-18 | Discharge: 2020-09-18 | Disposition: A | Discharge status: Home / Self Care | Payer: Medicare Other | Source: Ambulatory Visit | Attending: Physician Assistant | Admitting: Physician Assistant

## 2020-09-18 VITALS — BP 118/62 | HR 99 | Ht 70.0 in | Wt 179.4 lb

## 2020-09-18 DIAGNOSIS — Z87891 Personal history of nicotine dependence: Secondary | ICD-10-CM | POA: Diagnosis not present

## 2020-09-18 DIAGNOSIS — L570 Actinic keratosis: Secondary | ICD-10-CM | POA: Diagnosis not present

## 2020-09-18 DIAGNOSIS — I4819 Other persistent atrial fibrillation: Secondary | ICD-10-CM | POA: Insufficient documentation

## 2020-09-18 DIAGNOSIS — C44311 Basal cell carcinoma of skin of nose: Secondary | ICD-10-CM | POA: Diagnosis not present

## 2020-09-18 DIAGNOSIS — D6869 Other thrombophilia: Secondary | ICD-10-CM | POA: Diagnosis not present

## 2020-09-18 DIAGNOSIS — I10 Essential (primary) hypertension: Secondary | ICD-10-CM | POA: Diagnosis not present

## 2020-09-18 DIAGNOSIS — Z7901 Long term (current) use of anticoagulants: Secondary | ICD-10-CM | POA: Insufficient documentation

## 2020-09-18 DIAGNOSIS — L821 Other seborrheic keratosis: Secondary | ICD-10-CM | POA: Diagnosis not present

## 2020-09-18 MED ORDER — METOPROLOL SUCCINATE ER 25 MG PO TB24: 25.0000 mg | ORAL_TABLET | Freq: Every day | ORAL | 3 refills | Status: DC

## 2020-09-18 NOTE — Progress Notes (Signed)
Primary Care Physician: Haywood Pao, MD Primary Cardiologist: Dr Harrington Challenger Primary Electrophysiologist: Dr Quentin Ore Referring Physician: Dr Quentin Ore   Derrick White is a 80 y.o. male with a history of prior CVA, cerebellar ataxia, and atrial fibrillation who presents for follow up in the [facility]. Patient is on Eliquis for a CHADS2VASC score of 5. Patient was seen by Dr Quentin Ore for [facility] device consideration. He is not felt to be a candidate for long term anticoagulation due to his high fall risk with cerebellar ataxia. Patient is s/p Watchman implant 07/27/20. He was noted to be in persistent afib with symptoms of fatigue and underwent TEE/DCCV on 09/13/20.  On follow up today, patient reports initially that he felt "great". He was very surprised to hear he was back in afib although in hindsight he is a little more fatigued today. TEE showed well seated Watchman device without leak or thrombus. He denies any bleeding issues on anticoagulation.   Today, he denies symptoms of palpitations, chest pain, shortness of breath, orthopnea, PND, lower extremity edema, dizziness, presyncope, syncope, snoring, daytime somnolence, bleeding. The patient is tolerating medications without difficulties and is otherwise without complaint today.    Atrial Fibrillation Risk Factors:  he does not have symptoms or diagnosis of sleep apnea. he does not have a history of rheumatic fever.   he has a BMI of Body mass index is 25.74 kg/m.Marland Kitchen Filed Weights   09/18/20 0839  Weight: 81.4 kg    Family History  Problem Relation Age of Onset  . Stroke Neg Hx      Atrial Fibrillation Management history:  Previous antiarrhythmic drugs: none Previous cardioversions: 09/13/20 Previous ablations: none CHADS2VASC score: 5 Anticoagulation history: Eliquis   Past Medical History:  Diagnosis Date  . C6 radiculopathy 05/04/2019  . Cerebellar ataxia ([place])   . Dysesthesia  06/04/2016  . Essential hypertension 11/05/2019  . Foot pain, 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  . Gait disturbance 03/16/2019  . History of cervical spinal surgery 06/04/2016  . History of hiatal hernia   . History of kidney stones   . Hypertension   . 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 Memory loss 03/16/2019  . 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  . Pain due to onychomycosis of toenails of both feet 04/28/2019  . Pain in left ankle and joints of left foot 08/06/2018  . Slurred speech 03/16/2019  . 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 TIA (transient ischemic attack) 11/05/2019  . Worsened handwriting 03/16/2019   Past Surgical History:  Procedure Laterality Date  . APPENDECTOMY     1940s  . [place]  . CARDIOVERSION N/A 09/13/2020   Procedure: CARDIOVERSION;  Surgeon: Sanda Klein, MD;  [facility];  Service: Cardiovascular;  Laterality: N/A;  . FRACTURE SURGERY     Left ankle, sx x4  . INGUINAL HERNIA REPAIR Right 03/05/2019   Procedure: OPEN RIGHT INGUINAL HERNIA REPAIR WITH MESH;  Surgeon: Clovis Riley, MD;  Location: [place];  Service: General;  Laterality: Right;  . KNEE SURGERY Left 1985  . LEFT ATRIAL APPENDAGE OCCLUSION N/A 07/27/2020   Procedure: LEFT ATRIAL APPENDAGE OCCLUSION;  Surgeon: Sherren Mocha, MD;  Location: [place] [facility];  Service: Cardiovascular;  Laterality: N/A;  . SHOULDER SURGERY Left   . TEE WITHOUT CARDIOVERSION N/A 07/27/2020   Procedure: TRANSESOPHAGEAL ECHOCARDIOGRAM (TEE);  Surgeon: Sherren Mocha, MD;  Location: [place] [facility];  Service: Cardiovascular;  Laterality:  N/A;  . TEE WITHOUT CARDIOVERSION N/A 09/13/2020   Procedure: TRANSESOPHAGEAL ECHOCARDIOGRAM (TEE);  Surgeon: Sanda Klein, MD;  Location: [facility] ENDOSCOPY;  Service: Cardiovascular;  Laterality: N/A;  . TONSILLECTOMY     1945    Current Outpatient Medications  Medication Sig Dispense Refill  . acetaminophen (TYLENOL) 500 MG  tablet Take 500 mg by mouth every 6 (six) hours as needed for moderate pain or headache.    Marland Kitchen atorvastatin (LIPITOR) 40 MG tablet Take 1 tablet (40 mg total) by mouth daily. (Patient taking differently: Take 40 mg by mouth at bedtime.) 90 tablet 3  . Cholecalciferol 25 MCG (1000 UT) tablet Take 1,000 Units by mouth every evening.    Marland Kitchen ELIQUIS 5 MG TABS tablet TAKE 1 TABLET TWICE A DAY (Patient taking differently: Take 5 mg by mouth 2 (two) times daily.) 180 tablet 1  . fluticasone (FLONASE) 50 MCG/ACT nasal spray Place 1 spray into both nostrils daily as needed for allergies or rhinitis.    Marland Kitchen oxybutynin (DITROPAN-XL) 10 MG 24 hr tablet Take 10 mg by mouth at bedtime.    . pantoprazole (PROTONIX) 40 MG tablet Take 40 mg by mouth daily.   2  . telmisartan (MICARDIS) 80 MG tablet Take 80 mg by mouth daily.     Marland Kitchen Propylene Glycol (SYSTANE BALANCE) 0.6 % SOLN Place 1 drop into both eyes daily as needed (burning).     No current facility-administered medications for this encounter.    Allergies  Allergen Reactions  . Morphine Hives and Itching  . Codeine Hives and Itching  . Propoxyphene Itching    DARVOCET  . Shrimp [Shellfish Allergy] Rash    Social History   Socioeconomic History  . Marital status: Married    Spouse name: Not on file  . Number of children: Not on file  . Years of education: Not on file  . Highest education level: Not on file  Occupational History  . Occupation: retired  Tobacco Use  . Smoking status: Former Smoker    Packs/day: 1.00    Years: 30.00    Pack years: 30.00    Types: Cigarettes    Quit date: 1995    Years since quitting: 27.3  . Smokeless tobacco: Never Used  Vaping Use  . Vaping Use: Never used  Substance and Sexual Activity  . Alcohol use: Yes    Alcohol/week: 14.0 standard drinks    Types: 14 Cans of beer per week    Comment: 2 beers a day  . Drug use: Never  . Sexual activity: Not on file  Other Topics Concern  . Not on file  Social  History Narrative  . Not on file   Social Determinants of Health   Financial Resource Strain: Not on file  Food Insecurity: Not on file  Transportation Needs: Not on file  Physical Activity: Not on file  Stress: Not on file  Social Connections: Not on file  Intimate Partner Violence: Not on file     ROS- All systems are reviewed and negative except as per the HPI above.  Physical Exam: Vitals:   09/18/20 0839  BP: 118/62  Pulse: 99  Weight: 81.4 kg  Height: 5\' 10"  (1.778 m)    GEN- The patient is a well appearing elderly male, alert and oriented x 3 today.   HEENT-head normocephalic, atraumatic, sclera clear, conjunctiva pink, hearing intact, trachea midline. Lungs- Clear to ausculation bilaterally, normal work of breathing Heart- irregular rate and rhythm, no murmurs, rubs  or gallops  GI- soft, NT, ND, + BS Extremities- no clubbing, cyanosis, or edema MS- no significant deformity or atrophy Skin- no rash or lesion Psych- euthymic mood, full affect Neuro- ataxic gait   Wt Readings from Last 3 Encounters:  09/18/20 81.4 kg  09/13/20 79.4 kg  08/28/20 81.3 kg    EKG today demonstrates  Afib  Vent. rate 99 BPM PR interval * ms QRS duration 88 ms QT/QTcB 338/433 ms  Echo 11/05/19 demonstrated  1. Left ventricular ejection fraction, by estimation, is 60 to 65%. The  left ventricle has normal function. The left ventricle has no regional  wall motion abnormalities. Left ventricular diastolic parameters were  normal.  2. Right ventricular systolic function is normal. The right ventricular  size is normal. There is normal pulmonary artery systolic pressure.  3. The mitral valve is normal in structure. No evidence of mitral valve  regurgitation. No evidence of mitral stenosis.  4. The aortic valve is normal in structure. Aortic valve regurgitation is  not visualized. No aortic stenosis is present.  5. Aortic dilatation noted. There is borderline dilatation of  the aortic  root.  6. The inferior vena cava is normal in size with greater than 50%  respiratory variability, suggesting right atrial pressure of 3 mmHg.   Cardiac CT 07/14/20 Large Wind-sock appendage with prominent lateral trabeculation with maximum landing zone diameter 23 mm Suitable for a 27 Watchman FLX device with 14.8% compression  Epic records are reviewed at length today  HAS-BLED score 3 Hypertension Yes  Abnormal renal and liver function (Dialysis, transplant, Cr >2.26 mg/dL /Cirrhosis or Bilirubin >2x Normal or AST/ALT/AP >3x Normal) No  Stroke Yes  Bleeding No  Labile INR (Unstable/high INR) No  Elderly (>65) Yes  Drugs or alcohol (? 8 drinks/week, anti-plt or NSAID) No   CHA2DS2-VASc Score = 5  The patient's score is based upon: CHF History: No HTN History: Yes Diabetes History: No Stroke History: Yes Vascular Disease History: No Age Score: 2 Gender Score: 0      ASSESSMENT AND PLAN: 1. Persistent Atrial Fibrillation The patient's CHA2DS2-VASc score is 5, indicating a 7.2% annual risk of stroke.  S/p Watchman implant 07/27/20 S/p TEE/DCCV on 09/13/20 with early return of afib. Unclear how symptomatic he is with afib. Will start Toprol 25 mg daily. He will need to continue Eliquis 5 mg BID for an additional month post DCCV. Continue Eliquis 5 mg BID and ASA 81 mg daily. TEE showed well seated device without leak or thrombus.   2. 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  Continue Apixaban (Eliquis). See plans above.  3. HTN Stable, med changes as above.   Follow up with Dr Quentin Ore in 3-4 weeks.    [facility] [address] [place], [place] [address] [phone] 09/18/2020 9:05 AM

## 2020-09-20 ENCOUNTER — Ambulatory Visit (HOSPITAL_COMMUNITY): Payer: Medicare Other | Admitting: Physician Assistant

## 2020-09-25 DIAGNOSIS — Z23 Encounter for immunization: Secondary | ICD-10-CM | POA: Diagnosis not present

## 2020-09-27 ENCOUNTER — Telehealth: Payer: Self-pay | Admitting: Cardiology

## 2020-09-27 MED ORDER — METOPROLOL SUCCINATE ER 25 MG PO TB24: 12.5000 mg | ORAL_TABLET | Freq: Every day | ORAL | 3 refills | Status: DC

## 2020-09-27 NOTE — Telephone Encounter (Signed)
Patient's wife called to say that the patient has been in afib since 5/2 and now has lost his energy to do anything. Wife is unsure of what she needs to do. Please advise.

## 2020-09-27 NOTE — Telephone Encounter (Signed)
Outreach made to Pt.  Spoke with Pt's wife.  Advised to reduce Toprol 25 mg to 1/2 tablet by mouth at night.  Monitor for another week.  If fatigue continues notify afib clinic.  Wife indicates understanding.

## 2020-10-03 DIAGNOSIS — C44311 Basal cell carcinoma of skin of nose: Secondary | ICD-10-CM | POA: Diagnosis not present

## 2020-10-03 DIAGNOSIS — Z85828 Personal history of other malignant neoplasm of skin: Secondary | ICD-10-CM | POA: Diagnosis not present

## 2020-10-05 NOTE — Progress Notes (Signed)
Cardiology Office Note   Date:  10/06/2020   ID:  Derrick White, DOB Oct 08, 1940, MRN [id number]  PCP:  Haywood Pao, MD  Cardiologist:   Dorris Carnes, MD   Pt presents for f/u of paroxysmal afib  History of Present Illness: Derrick White is a 81 y.o. male with a history of hyperlipidemia, hypertension, CV disease. 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  Watchman noted to be well seated   No thrombus noted      Seen in clinic on 5/2  Back in afib.  Started on Toprol XL 25    Pt felt very fatigued   Called in and does cut back to 12.5    He is feeling better  Still naps but          Current Meds  Medication Sig  . acetaminophen (TYLENOL) 500 MG tablet Take 500 mg by mouth every 6 (six) hours as needed for moderate pain or headache.  Marland Kitchen atorvastatin (LIPITOR) 40 MG tablet Take 1 tablet (40 mg total) by mouth daily.  . cephALEXin (KEFLEX) 500 MG capsule Take 500 mg by mouth in the morning, at noon, and at bedtime.  . Cholecalciferol 25 MCG (1000 UT) tablet Take 1,000 Units by mouth every evening.  Marland Kitchen ELIQUIS 5 MG TABS tablet TAKE 1 TABLET TWICE A DAY  . fluticasone (FLONASE) 50 MCG/ACT nasal spray Place 1 spray into both nostrils daily as needed for allergies or rhinitis.  . metoprolol succinate (TOPROL XL) 25 MG 24 hr tablet Take 0.5 tablets (12.5 mg total) by mouth daily.  Marland Kitchen oxybutynin (DITROPAN-XL) 10 MG 24 hr tablet Take 10 mg by mouth at bedtime.  . pantoprazole (PROTONIX) 40 MG tablet  Take 40 mg by mouth daily.   Marland Kitchen Propylene Glycol (SYSTANE BALANCE) 0.6 % SOLN Place 1 drop into both eyes daily as needed (burning).  Marland Kitchen telmisartan (MICARDIS) 80 MG tablet Take 80 mg by mouth daily.      Allergies:   Morphine, Codeine, Propoxyphene, and Shrimp [shellfish allergy]   Past Medical History:  Diagnosis Date  . C6 radiculopathy 05/04/2019  . Cerebellar ataxia ([place])   . Dysesthesia 06/04/2016  . Essential hypertension 11/05/2019  . Foot pain, 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  . Gait disturbance 03/16/2019  . History of cervical spinal surgery 06/04/2016  . History of hiatal hernia   . History of kidney stones   . Hypertension   . 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 Memory loss 03/16/2019  . 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  . Pain due to onychomycosis of toenails of both feet 04/28/2019  . Pain in left ankle and joints of left foot 08/06/2018  . Slurred speech 03/16/2019  . 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 TIA (transient ischemic attack) 11/05/2019  . Worsened handwriting 03/16/2019  Past Surgical History:  Procedure Laterality Date  . APPENDECTOMY     1940s  . [place]  . CARDIOVERSION N/A 09/13/2020   Procedure: CARDIOVERSION;  Surgeon: Sanda Klein, MD;  [facility];  Service: Cardiovascular;  Laterality: N/A;  . FRACTURE SURGERY     Left ankle, sx x4  . INGUINAL HERNIA REPAIR Right 03/05/2019   Procedure: OPEN RIGHT INGUINAL HERNIA REPAIR WITH MESH;  Surgeon: Clovis Riley, MD;  Location: [place];  Service: General;  Laterality: Right;  . KNEE SURGERY Left 1985  . LEFT ATRIAL APPENDAGE OCCLUSION N/A 07/27/2020   Procedure: LEFT ATRIAL APPENDAGE OCCLUSION;  Surgeon: Sherren Mocha, MD;  Location: [place] [facility];  Service: Cardiovascular;  Laterality: N/A;  . SHOULDER SURGERY Left   . TEE WITHOUT CARDIOVERSION N/A 07/27/2020   Procedure: TRANSESOPHAGEAL ECHOCARDIOGRAM (TEE);  Surgeon: Sherren Mocha, MD;   Location: [place] [facility];  Service: Cardiovascular;  Laterality: N/A;  . TEE WITHOUT CARDIOVERSION N/A 09/13/2020   Procedure: TRANSESOPHAGEAL ECHOCARDIOGRAM (TEE);  Surgeon: Sanda Klein, MD;  Location: [place];  Service: Cardiovascular;  Laterality: N/A;  . TONSILLECTOMY     1945     Social History:  The patient  reports that he quit smoking about 27 years ago. His smoking use included cigarettes. He has a 30.00 pack-year smoking history. He has never used smokeless tobacco. He reports current alcohol use of about 14.0 standard drinks of alcohol per week. He reports that he does not use drugs.   Family History:  The patient's family history is not on file.    ROS:  Please see the history of present illness. All other systems are reviewed and  Negative to the above problem except as noted.    PHYSICAL EXAM: VS:  BP 106/62   Pulse 78   Ht 5\' 10"  (1.778 m)   Wt 176 lb 9.6 oz (80.1 kg)   SpO2 97%   BMI 25.34 kg/m   GEN: Well nourished, well developed, in no acute distress  HEENT: normal  Neck: no JVD, no carotid bruits Cardiac:  Irreg irreg ; no murmurs.  No LE edema  Respiratory:  clear to auscultation bilaterally GI: soft, nontender, nondistended, + BS  No hepatomegaly  MS: no deformity Moving all extremities   Skin: warm and dry, no rash Neuro:  Speech staggered  Exam deferred otherwise    EKG:  EKG is not ordered today.    TEE  09/13/20  1. Left ventricular ejection fraction, by estimation, is 60 to 65%. The left ventricle has normal function. The left ventricle has no regional wall motion abnormalities. 2. Right ventricular systolic function is normal. The right ventricular size is normal. 3. Well seated Watchman device in the left atrial appendage, without leak.. Left atrial size was mildly dilated. No left atrial/left atrial appendage thrombus was detected. 4. The mitral valve is normal in structure. No evidence of mitral valve regurgitation. 5. The aortic  valve is tricuspid. Aortic valve regurgitation is trivial. No aortic stenosis is present. 6. Aortic dilatation noted. There is mild dilatation of the aortic root, measuring 39 mm. There is mild (Grade II) plaque involving the descending aorta. 7. Evidence of atrial level shunting detected by color flow Doppler (s/p transseptal puncture). 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  Lipid Panel    Component Value Date/Time   CHOL 128 01/07/2020 1124  TRIG 107 01/07/2020 1124   HDL 50 01/07/2020 1124   CHOLHDL 2.6 01/07/2020 1124   CHOLHDL 3.2 11/05/2019 0959   VLDL 32 11/05/2019 0959   LDLCALC 58 01/07/2020 1124      Wt Readings from Last 3 Encounters:  10/06/20 176 lb 9.6 oz (80.1 kg)  09/18/20 179 lb 6.4 oz (81.4 kg)  09/13/20 175 lb (79.4 kg)      ASSESSMENT AND PLAN:  1  Atrial fibrillation.   Pt is s/p Watchman on 07/27/20.   Cardioverted to SR on 4/27 but then reverted back to afib by 5/2.   Will discuss with EP  Not clear on how long Eliquis used after Watchman placement.   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  The patient does not have concerns regarding medicines.  Signed, Dorris Carnes, MD  10/06/2020 10:30 PM    [facility] [place], [place], [place]  [address] Phone: [phone]; Fax: [phone]

## 2020-10-06 ENCOUNTER — Ambulatory Visit (INDEPENDENT_AMBULATORY_CARE_PROVIDER_SITE_OTHER): Payer: Medicare Other | Admitting: Internal Medicine

## 2020-10-06 ENCOUNTER — Encounter: Payer: Self-pay | Admitting: Internal Medicine

## 2020-10-06 ENCOUNTER — Other Ambulatory Visit: Payer: Self-pay

## 2020-10-06 VITALS — BP 106/62 | HR 78 | Ht 70.0 in | Wt 176.6 lb

## 2020-10-06 DIAGNOSIS — I63429 Cerebral infarction due to embolism of unspecified anterior cerebral artery: Secondary | ICD-10-CM

## 2020-10-06 DIAGNOSIS — R296 Repeated falls: Secondary | ICD-10-CM

## 2020-10-06 NOTE — Patient Instructions (Signed)
Medication Instructions:  No changes *If you need a refill on your cardiac medications before your next appointment, please call your pharmacy*   Lab Work: none If you have labs (blood work) drawn today and your tests are completely normal, you will receive your results only by: Marland Kitchen MyChart Message (if you have MyChart) OR . A paper copy in the mail If you have any lab test that is abnormal or we need to change your treatment, we will call you to review the results.   Testing/Procedures: none   Follow-Up: We will call you with plan for follow up.  Other Instructions [facility] - they will call you to schedule

## 2020-10-09 NOTE — Progress Notes (Signed)
Electrophysiology Office Note Date: 10/10/2020  ID:  Derrick White, DOB [birth year], MRN [id number]  PCP: Haywood Pao, MD Primary Cardiologist: Harrington Challenger Electrophysiologist: Quentin Ore  CC: AF follow up  Derrick White is a 80 y.o. male seen today for Dr Lurline Hare.  He presents today for routine electrophysiology followup.  Since last being seen in our clinic, the patient reports doing reasonably well.  He remains fatigued with his atrial fibrillation which is now persistent.  He denies chest pain, palpitations, dyspnea, PND, orthopnea, nausea, vomiting, dizziness, syncope, edema, weight gain, or early satiety.  Past Medical History:  Diagnosis Date  . C6 radiculopathy 05/04/2019  . Cerebellar ataxia ([place])   . Dysesthesia 06/04/2016  . Essential hypertension 11/05/2019  . Foot pain, 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  . Gait disturbance 03/16/2019  . History of cervical spinal surgery 06/04/2016  . History of hiatal hernia   . History of kidney stones   . Hypertension   . 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 Memory loss 03/16/2019  . 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  . Pain due to onychomycosis of toenails of both feet 04/28/2019  . Pain in left ankle and joints of left foot 08/06/2018  . Slurred speech 03/16/2019  . 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 TIA (transient ischemic attack) 11/05/2019  . Worsened handwriting 03/16/2019   Past Surgical History:  Procedure Laterality Date  . APPENDECTOMY     1940s  . [place]  . CARDIOVERSION N/A 09/13/2020   Procedure: CARDIOVERSION;  Surgeon: Sanda Klein, MD;  [facility];  Service: Cardiovascular;  Laterality: N/A;  . FRACTURE SURGERY     Left ankle, sx x4  . INGUINAL HERNIA REPAIR Right 03/05/2019   Procedure: OPEN RIGHT INGUINAL HERNIA REPAIR WITH MESH;  Surgeon: Clovis Riley, MD;  Location: [facility];  Service: General;  Laterality: Right;  . KNEE SURGERY Left 1985  .  LEFT ATRIAL APPENDAGE OCCLUSION N/A 07/27/2020   Procedure: LEFT ATRIAL APPENDAGE OCCLUSION;  Surgeon: Sherren Mocha, MD;  Location: [facility];  Service: Cardiovascular;  Laterality: N/A;  . SHOULDER SURGERY Left   . TEE WITHOUT CARDIOVERSION N/A 07/27/2020   Procedure: TRANSESOPHAGEAL ECHOCARDIOGRAM (TEE);  Surgeon: Sherren Mocha, MD;  Location: [facility];  Service: Cardiovascular;  Laterality: N/A;  . TEE WITHOUT CARDIOVERSION N/A 09/13/2020   Procedure: TRANSESOPHAGEAL ECHOCARDIOGRAM (TEE);  Surgeon: Sanda Klein, MD;  Location: [facility] ENDOSCOPY;  Service: Cardiovascular;  Laterality: N/A;  . TONSILLECTOMY     1945    Current Outpatient Medications  Medication Sig Dispense Refill  . acetaminophen (TYLENOL) 500 MG tablet Take 500 mg by mouth every 6 (six) hours as needed for moderate pain or headache.    Marland Kitchen amiodarone (PACERONE) 200 MG tablet Take 2 tablets (400 mg total) by mouth 2 (two) times daily for 14 days, THEN 1 tablet (200 mg total) 2 (two) times daily for 14 days, THEN 1 tablet (200 mg total) daily. 90 tablet 3  . atorvastatin (LIPITOR) 40 MG tablet Take 1 tablet (40 mg total) by mouth daily. 90 tablet 3  . cephALEXin (KEFLEX) 500 MG capsule Take 500 mg by mouth in the morning, at noon, and at bedtime.    . Cholecalciferol 25 MCG (1000 UT) tablet Take 1,000 Units by mouth every evening.    Marland Kitchen ELIQUIS 5 MG TABS tablet TAKE 1 TABLET TWICE A DAY 180 tablet 1  . fluticasone (FLONASE) 50 MCG/ACT  nasal spray Place 1 spray into both nostrils daily as needed for allergies or rhinitis.    Marland Kitchen oxybutynin (DITROPAN-XL) 10 MG 24 hr tablet Take 10 mg by mouth at bedtime.    . pantoprazole (PROTONIX) 40 MG tablet Take 40 mg by mouth daily.   2  . Propylene Glycol (SYSTANE BALANCE) 0.6 % SOLN Place 1 drop into both eyes daily as needed (burning).    Marland Kitchen telmisartan (MICARDIS) 80 MG tablet Take 80 mg by mouth daily.      No current facility-administered medications for this visit.     Allergies:   Morphine, Codeine, Propoxyphene, and Shrimp [shellfish allergy]   Social History: Social History   Socioeconomic History  . Marital status: Married    Spouse name: Not on file  . Number of children: Not on file  . Years of education: Not on file  . Highest education level: Not on file  Occupational History  . Occupation: retired  Tobacco Use  . Smoking status: Former Smoker    Packs/day: 1.00    Years: 30.00    Pack years: 30.00    Types: Cigarettes    Quit date: 1995    Years since quitting: 27.4  . Smokeless tobacco: Never Used  Vaping Use  . Vaping Use: Never used  Substance and Sexual Activity  . Alcohol use: Yes    Alcohol/week: 14.0 standard drinks    Types: 14 Cans of beer per week    Comment: 2 beers a day  . Drug use: Never  . Sexual activity: Not on file  Other Topics Concern  . Not on file  Social History Narrative  . Not on file   Social Determinants of Health   Financial Resource Strain: Not on file  Food Insecurity: Not on file  Transportation Needs: Not on file  Physical Activity: Not on file  Stress: Not on file  Social Connections: Not on file  Intimate Partner Violence: Not on file    Family History: Family History  Problem Relation Age of Onset  . Stroke Neg Hx     Review of Systems: All other systems reviewed and are otherwise negative except as noted above.   Physical Exam: VS:  BP 116/70   Pulse 82   Ht 5\' 10"  (1.778 m)   Wt 180 lb 6.4 oz (81.8 kg)   SpO2 96%   BMI 25.88 kg/m  , BMI Body mass index is 25.88 kg/m. Wt Readings from Last 3 Encounters:  10/10/20 180 lb 6.4 oz (81.8 kg)  10/06/20 176 lb 9.6 oz (80.1 kg)  09/18/20 179 lb 6.4 oz (81.4 kg)    GEN- The patient is well appearing, alert and oriented x 3 today.   HEENT: normocephalic, atraumatic; sclera clear, conjunctiva pink; hearing intact; oropharynx clear; neck supple  Lungs- Clear to ausculation bilaterally, normal work of breathing.  No  wheezes, rales, rhonchi Heart- Irregular rate and rhythm, no murmurs, rubs or gallops, PMI not laterally displaced GI- soft, non-tender, non-distended, bowel sounds present, no hepatosplenomegaly Extremities- no clubbing, cyanosis, or edema; DP/PT/radial pulses 2+ bilaterally MS- no significant deformity or atrophy Skin- warm and dry, no rash or lesion  Psych- euthymic mood, full affect Neuro- strength and sensation are intact   EKG:  EKG is ordered today. The ekg ordered today shows atrial fibrillation, rate 82  Recent Labs: 11/05/2019: ALT 20; TSH 2.024 08/28/2020: Platelets 282 09/13/2020: BUN 21; Creatinine, Ser 1.00; Hemoglobin 13.9; Potassium 4.1; Sodium 141    Other  studies Reviewed: Additional studies/ records that were reviewed today include: AF clinic notes  Assessment and Plan:  1.  Persistent atrial fibrillation S/p Watch[place] 07/27/20 with DCCV 09/13/20 (TEE showed well seated device at that time) He unfortunately has reverted back to atrial fibrillation He is symptomatic with fatigue We discussed treatment options today.  After review of AAD options available, will start Amiodarone 400mg  bid x 2 weeks then 200mg  bid x2 weeks then 200mg  daily Follow up AF clinic 2-3 weeks - if still in AF, will likely need repeat DCCV   2.  HTN Hypotensive at home Stop Metoprolol    Current medicines are reviewed at length with the patient today.   The patient does not have concerns regarding his medicines.  The following changes were made today:  See above  Labs/ tests ordered today include:  Orders Placed This Encounter  Procedures  . Comprehensive metabolic panel  . TSH  . EKG 12-Lead     Disposition:   Follow up with Dr Quentin Ore 3 months    Signed, Chanetta Marshall, NP 10/10/2020 9:07 AM   [facility] [address] [place] [address] [phone] (office) [phone] (fax)

## 2020-10-10 ENCOUNTER — Ambulatory Visit (INDEPENDENT_AMBULATORY_CARE_PROVIDER_SITE_OTHER): Payer: Medicare Other | Admitting: Nurse Practitioner

## 2020-10-10 ENCOUNTER — Encounter: Payer: Self-pay | Admitting: Nurse Practitioner

## 2020-10-10 ENCOUNTER — Other Ambulatory Visit: Payer: Self-pay

## 2020-10-10 VITALS — BP 116/70 | HR 82 | Ht 70.0 in | Wt 180.4 lb

## 2020-10-10 DIAGNOSIS — D6869 Other thrombophilia: Secondary | ICD-10-CM | POA: Diagnosis not present

## 2020-10-10 DIAGNOSIS — I4819 Other persistent atrial fibrillation: Secondary | ICD-10-CM

## 2020-10-10 DIAGNOSIS — I63429 Cerebral infarction due to embolism of unspecified anterior cerebral artery: Secondary | ICD-10-CM | POA: Diagnosis not present

## 2020-10-10 DIAGNOSIS — I10 Essential (primary) hypertension: Secondary | ICD-10-CM | POA: Diagnosis not present

## 2020-10-10 LAB — COMPREHENSIVE METABOLIC PANEL
ALT: 19 IU/L (ref 0–44)
AST: 19 IU/L (ref 0–40)
Albumin/Globulin Ratio: 2 (ref 1.2–2.2)
Albumin: 4.5 g/dL (ref 3.7–4.7)
Alkaline Phosphatase: 36 IU/L — ABNORMAL LOW (ref 44–121)
BUN/Creatinine Ratio: 15 (ref 10–24)
BUN: 18 mg/dL (ref 8–27)
Bilirubin Total: 0.8 mg/dL (ref 0.0–1.2)
CO2: 22 mmol/L (ref 20–29)
Calcium: 9.7 mg/dL (ref 8.6–10.2)
Chloride: 102 mmol/L (ref 96–106)
Creatinine, Ser: 1.17 mg/dL (ref 0.76–1.27)
Globulin, Total: 2.2 g/dL (ref 1.5–4.5)
Glucose: 101 mg/dL — ABNORMAL HIGH (ref 65–99)
Potassium: 4.6 mmol/L (ref 3.5–5.2)
Sodium: 139 mmol/L (ref 134–144)
Total Protein: 6.7 g/dL (ref 6.0–8.5)
eGFR: 63 mL/min/{1.73_m2} (ref >59)

## 2020-10-10 LAB — TSH: TSH: 2.76 u[IU]/mL (ref 0.450–4.500)

## 2020-10-10 MED ORDER — AMIODARONE HCL 200 MG PO TABS: ORAL_TABLET | ORAL | 3 refills | Status: DC

## 2020-10-10 NOTE — Patient Instructions (Signed)
Medication Instructions:   1. Stop Metoprolol  2. Begin Amiodarone -Take 2 tablets (400mg ) two times daily for 14 days, THEN -Take 1 tablet (200mg ) two times daily for 14 days, THEN -Take 1 tablet (200mg ) once daily  Labwork:  CMP and TSH today  Testing/Procedures: None ordered.  Follow-Up:  Follow up appointment made with Adline Peals at the Afib clinic for 6/14 at 10:30am  Any Other Special Instructions Will Be Listed Below (If Applicable).     If you need a refill on your cardiac medications before your next appointment, please call your pharmacy.

## 2020-10-18 ENCOUNTER — Telehealth: Payer: Self-pay

## 2020-10-18 MED ORDER — AMIODARONE HCL 200 MG PO TABS: 200.0000 mg | ORAL_TABLET | Freq: Every day | ORAL | 3 refills | Status: DC

## 2020-10-18 NOTE — Telephone Encounter (Signed)
Spoke to pt who stated he wanted to make sure lowering the dose would still control his rhythm. He states he had 5 days left of being on the Amiodarone 400 mg BID and wanted to know if it would be better for him to do that for the remaining days or to go with the new plan of holding for 2 days and decreasing to 200 mg daily.   Explained to pt that it will take a little longer to get rhythm controlled on the lower dose, but what Dr. Quentin Ore recommended will be the best for him and he will feel better. Explained that the lower dose of Amiodarone will control his rhythm, just will take longer.   Pt verbalized understanding and appreciated the call back. Informed pt to call if he has any other questions or concerns.

## 2020-10-18 NOTE — Telephone Encounter (Signed)
-----   Message from Vickie Epley, MD sent at 10/13/2020  8:19 AM EDT ----- Worthy Keeler looks good.  Lysbeth Galas T. Quentin Ore, MD, [facility], [facility] Cardiac Electrophysiology

## 2020-10-18 NOTE — Telephone Encounter (Signed)
Patient calling back wanted to ask more questions. Please assist

## 2020-10-18 NOTE — Telephone Encounter (Signed)
Returned call to Pt's wife.  Advised per Dr. Quentin Ore: Hold amiodarone for 2 days. Then restart at amiodarone 200 mg one tablet by mouth daily.  Advised to call me in a week if symptoms have not improved.

## 2020-10-18 NOTE — Telephone Encounter (Signed)
Spoke with the pts wife on DPR re: the pts lab results and she reports that the pt has ongoing fatigue and lethargy but the last 2 days have been much worse than his baseline.   She says he can barely get off of the couch and he has been experiencing some dizziness with rising.   He denies SOB, chest pain, palpitations. Just feels weak and "lifeless".   I advised that I will forward to Dr. Ross/ Dr. Quentin Ore for review.   She says they were planning to go out of town 11/06/20 but with his current physical state he would not be able to go.   Next appt with Afib clinic is not until 10/31/20.   Started Amiodarone 10/10/20.

## 2020-10-18 NOTE — Addendum Note (Signed)
Addended by: Willeen Cass A on: 10/18/2020 04:18 PM   Modules accepted: Orders

## 2020-10-19 ENCOUNTER — Ambulatory Visit: Payer: Medicare Other

## 2020-10-23 ENCOUNTER — Ambulatory Visit: Payer: Medicare Other | Attending: Internal Medicine

## 2020-10-23 ENCOUNTER — Other Ambulatory Visit: Payer: Self-pay

## 2020-10-23 DIAGNOSIS — R270 Ataxia, unspecified: Secondary | ICD-10-CM | POA: Diagnosis not present

## 2020-10-23 DIAGNOSIS — M6281 Muscle weakness (generalized): Secondary | ICD-10-CM | POA: Diagnosis not present

## 2020-10-23 DIAGNOSIS — R2681 Unsteadiness on feet: Secondary | ICD-10-CM

## 2020-10-23 NOTE — Therapy (Signed)
[facility] [address] [place], [place], [address] Phone: [phone]   Fax:  [phone]  Physical Therapy Evaluation  Patient Details  Name: Derrick White MRN: [id number] Date of Birth: May 07, 1941 Referring Provider (PT): Dorris Carnes MD   Encounter Date: 10/23/2020   PT End of Session - 10/23/20 1027    Visit Number 1    Number of Visits 17    Date for PT Re-Evaluation 12/25/20    Authorization Type Medicare, PN/Re-cert 05/22/22    Progress Note Due on Visit 10    PT Start Time 1020    PT Stop Time 1105    PT Time Calculation (min) 45 min    Equipment Utilized During Treatment Gait belt    Activity Tolerance Patient tolerated treatment well    Behavior During Therapy [facility] for tasks assessed/performed           Past Medical History:  Diagnosis Date  . C6 radiculopathy 05/04/2019  . Cerebellar ataxia ([place])   . Dysesthesia 06/04/2016  . Essential hypertension 11/05/2019  . Foot pain, 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  . Gait disturbance 03/16/2019  . History of cervical spinal surgery 06/04/2016  . History of hiatal hernia   . History of kidney stones   . Hypertension   . 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 Memory loss 03/16/2019  . 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  . Pain due to onychomycosis of toenails of both feet 04/28/2019  . Pain in left ankle and joints of left foot 08/06/2018  . Slurred speech 03/16/2019  . 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 TIA (transient ischemic attack) 11/05/2019  . Worsened handwriting 03/16/2019    Past Surgical History:  Procedure Laterality Date  . APPENDECTOMY     1940s  . [place]  . CARDIOVERSION N/A 09/13/2020   Procedure: CARDIOVERSION;  Surgeon: Sanda Klein, MD;  Location: [facility];  Service: Cardiovascular;  Laterality: N/A;  . FRACTURE SURGERY     Left ankle, sx x4  . INGUINAL HERNIA REPAIR Right 03/05/2019    Procedure: OPEN RIGHT INGUINAL HERNIA REPAIR WITH MESH;  Surgeon: Clovis Riley, MD;  Location: [facility] [place];  Service: General;  Laterality: Right;  . KNEE SURGERY Left 1985  . LEFT ATRIAL APPENDAGE OCCLUSION N/A 07/27/2020   Procedure: LEFT ATRIAL APPENDAGE OCCLUSION;  Surgeon: Sherren Mocha, MD;  Location: [facility];  Service: Cardiovascular;  Laterality: N/A;  . SHOULDER SURGERY Left   . TEE WITHOUT CARDIOVERSION N/A 07/27/2020   Procedure: TRANSESOPHAGEAL ECHOCARDIOGRAM (TEE);  Surgeon: Sherren Mocha, MD;  Location: [facility];  Service: Cardiovascular;  Laterality: N/A;  . TEE WITHOUT CARDIOVERSION N/A 09/13/2020   Procedure: TRANSESOPHAGEAL ECHOCARDIOGRAM (TEE);  Surgeon: Sanda Klein, MD;  Location: [facility] ENDOSCOPY;  Service: Cardiovascular;  Laterality: N/A;  . TONSILLECTOMY     1945    There were no vitals filed for this visit.    Subjective Assessment - 10/23/20 1024    Subjective Referred to OPPT for gait and balance issues related to ataxia and history of CVA, has been dealing with some vardiac issues which his cardiologist is addressing    Pertinent History HTN, cerebellar ataxia, oropharyngeal dysphagia, Sialorrhea, Chronic L ankle pain    Limitations Walking;Other (comment)    How long can you sit comfortably? no issues    How long can you stand comfortably? no issues    How long can you walk comfortably? 1/4 mile  Diagnostic tests 11/05/19: IMPRESSION:1. 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 Moderate chronic small vessel ischemic disease.3. Motion degraded head MRA without evidence of a large or mediumvessel occlusion or significant proximal stenosis.4. Negative neck MRA.  IMPRESSION:1. 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 Mild spinal stenosis and mild-to-moderate neural foraminalstenosis at C6-7, stable to minimally progressed.     Patient Stated Goals walk 1 mile comfortably, negotiate stairs better    Currently in Pain? No/denies    Pain Onset More than a month ago              [facility] PT Assessment - 10/23/20 0001      Assessment   Medical Diagnosis Cerebellar ataxia    Referring Provider (PT) Dorris Carnes MD    Prior Therapy OPPT      Balance Screen   Has the patient fallen in the past 6 months Yes    How many times? 1    Has the patient had a decrease in activity level because of a fear of falling?  No    Is the patient reluctant to leave their home because of a fear of falling?  No      Home Environment   Living Environment Private residence    Type of [place] Access Stairs to enter    Entrance Stairs-Number of Steps 3    Entrance Stairs-Rails Right;Left    Home Layout Two level    Alternate Level Stairs-Number of Steps 16      Prior Function   Level of Independence Independent      Coordination   Heel Shin Test [facility]      Posture/Postural Control   Posture Comments ataxic      ROM / Strength   AROM / PROM / Strength Strength      Strength   Overall Strength Comments 4+/5 strength grossly throughout      Ambulation/Gait   Ambulation/Gait Yes    Ambulation/Gait Assistance 5: Supervision;4: Min guard    Ambulation Distance (Feet) 115 Feet    Assistive device Straight cane    Gait Pattern Decreased arm swing - left;Decreased step length - right;Ataxic;Narrow base of support    Ambulation Surface Level;Indoor      Standardized Balance Assessment   Standardized Balance Assessment Five Times Sit to Stand    Five times sit to stand comments  12.75    10 Meter Walk 20.78      Timed Up and Go Test   Normal TUG (seconds) 16    TUG Comments 16.59 w/o cane; 16s wiith cane      High Level Balance   High Level Balance Comments Able to hold all 4 positions on MCTSIB for 30s                      Objective measurements completed on examination: See above findings.                PT Education - 10/23/20 1106    Education Details Patient educated on Eval findings, prognosis and POC and is in agreement    Person(s) Educated Patient    Methods Explanation    Comprehension Verbalized understanding            PT Short Term Goals - 10/23/20 1112      PT SHORT TERM GOAL #1   Title Patient to demo  initial HEP back to PT    Baseline TBD    Time 4    Period Weeks    Status New    Target Date 11/27/20      PT SHORT TERM GOAL #2   Title Patient will be able to ambulate 561ft across outdoor surface with LRAD under S    Baseline 115 ft with cane and SBA    Time 4    Period Weeks    Status New    Target Date 11/27/20      PT SHORT TERM GOAL #3   Title Patient to negotiate steps with single UE and cane with reciprocal pattern    Baseline B UEs on rail w/o cane 4 steps    Time 4    Period Weeks    Status New    Target Date 11/27/20      PT SHORT TERM GOAL #4   Title Patient to perform 5x STS in <11s w/o UE support w/o bracing knees against seat    Baseline 5x STS in 12.78s w/o UE assist while bracing knees against seat    Time 4    Period Weeks    Status New    Target Date 11/27/20             PT Long Term Goals - 10/23/20 1119      PT LONG TERM GOAL #1   Title Patient to perform TUG test in <13s with and w/o AD    Baseline Best Tug time 16.0s using cane    Time 8    Period Weeks    Status New    Target Date 12/25/20      PT LONG TERM GOAL #2   Title Patient to ambulate 1040ft with LRAD across outdoor surfaces    Baseline 164ft with SPC and SBA indoor surfaces    Time 8    Period Weeks    Status New    Target Date 12/25/20      PT LONG TERM GOAL #3   Title Patient to ambulate at 0.8 m/s with [facility]    Baseline Initial gait velocity 0.48 m/s    Time 8    Period Weeks    Status New    Target Date 12/25/20      PT LONG TERM GOAL #4   Title Assess progress towards BERG    Baseline TBD    Time 8    Period Weeks     Status New    Target Date 12/25/20      PT LONG TERM GOAL #5   Title Assess progress towards FGA/DGI    Baseline TBD    Time 8    Period Weeks    Status New    Target Date 12/25/20                  Plan - 10/23/20 1108    Clinical Impression Statement Patient returns to [place] following a decline in function in conjunction with cardiac issues otentially contributing to his functional decline.  He presents with good overall sterngth but demonstrates decreased gait velocity and ataxic gait pattern.  He uses a cane for ambulation needs but is able to function w/o it for household ambulation and short distances    Personal Factors and Comorbidities Age;Comorbidity 2;Past/Current Experience;Time since onset of injury/illness/exacerbation    Comorbidities HTN, cerebellar ataxia, oropharyngeal dysphagia, Sialorrhea, Chronic R ankle pain    Examination-Activity Limitations Carry;Lift;Squat;Stairs;Stand;Transfers    Examination-Participation  Restrictions Community Activity;Laundry;Shop;Yard Work    [employer] Evolving/Moderate complexity    Rehab Potential Good    PT Frequency 2x / week    PT Duration 8 weeks    PT Treatment/Interventions Patient/family education;ADLs/Self Care Home Management;Aquatic Therapy;DME Instruction;Gait training;Stair training;Functional mobility training;Therapeutic activities;Therapeutic exercise;Balance training;Neuromuscular re-education    PT Next Visit Plan Assess BERG and FGA/DGI and set goals, establish HEP    PT Home Exercise Plan [id number](from previous episode)    Consulted and Agree with Plan of Care Patient           Patient will benefit from skilled therapeutic intervention in order to improve the following deficits and impairments:  Abnormal gait,Decreased balance,Decreased activity tolerance,Decreased endurance,Decreased coordination,Decreased mobility,Decreased range of motion,Decreased strength,Difficulty  walking,Postural dysfunction,Pain  Visit Diagnosis: Muscle weakness (generalized)  Ataxia  Unsteadiness on feet     Problem List Patient Active Problem List   Diagnosis Date Noted  . Persistent atrial fibrillation ([place]) 08/28/2020  . Secondary hypercoagulable state ([place]) 07/20/2020  . Paroxysmal atrial fibrillation ([place]) 06/19/2020  . TIA (transient ischemic attack) 11/05/2019  . Essential hypertension 11/05/2019  . Cerebellar ataxia ([place]) 11/05/2019  . 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 C6 radiculopathy 05/04/2019  . Pain due to onychomycosis of toenails of both feet 04/28/2019  . Worsened handwriting 03/16/2019  . Gait disturbance 03/16/2019  . Slurred speech 03/16/2019  . Memory loss 03/16/2019  . Pain in left ankle and joints of left foot 08/06/2018  . 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  . Foot pain, 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  . Dysesthesia 06/04/2016  . 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 History of cervical spinal surgery 06/04/2016    Lanice Shirts PT 10/23/2020, 11:31 AM  [facility] [address] [place], [place], [address] Phone: [phone]   Fax:  [phone]  Name: Shawne Leodan Bolyard MRN: [id number] Date of Birth: 05-21-1940

## 2020-10-26 ENCOUNTER — Ambulatory Visit: Payer: Medicare Other | Admitting: Cardiology

## 2020-10-30 ENCOUNTER — Ambulatory Visit: Payer: Medicare Other

## 2020-10-31 ENCOUNTER — Encounter (HOSPITAL_COMMUNITY): Payer: Self-pay | Admitting: Physician Assistant

## 2020-10-31 ENCOUNTER — Other Ambulatory Visit: Payer: Self-pay

## 2020-10-31 ENCOUNTER — Ambulatory Visit (HOSPITAL_COMMUNITY)
Admission: RE | Admit: 2020-10-31 | Discharge: 2020-10-31 | Disposition: A | Discharge status: Home / Self Care | Payer: Medicare Other | Source: Ambulatory Visit | Attending: Physician Assistant | Admitting: Physician Assistant

## 2020-10-31 VITALS — BP 132/80 | HR 79 | Ht 70.0 in | Wt 178.0 lb

## 2020-10-31 DIAGNOSIS — Z7901 Long term (current) use of anticoagulants: Secondary | ICD-10-CM | POA: Diagnosis not present

## 2020-10-31 DIAGNOSIS — Z8673 Personal history of transient ischemic attack (TIA), and cerebral infarction without residual deficits: Secondary | ICD-10-CM | POA: Insufficient documentation

## 2020-10-31 DIAGNOSIS — Z95818 Presence of other cardiac implants and grafts: Secondary | ICD-10-CM | POA: Insufficient documentation

## 2020-10-31 DIAGNOSIS — I4819 Other persistent atrial fibrillation: Secondary | ICD-10-CM | POA: Insufficient documentation

## 2020-10-31 DIAGNOSIS — Z79899 Other long term (current) drug therapy: Secondary | ICD-10-CM | POA: Diagnosis not present

## 2020-10-31 DIAGNOSIS — G119 Hereditary ataxia, unspecified: Secondary | ICD-10-CM | POA: Diagnosis not present

## 2020-10-31 DIAGNOSIS — I10 Essential (primary) hypertension: Secondary | ICD-10-CM | POA: Diagnosis not present

## 2020-10-31 DIAGNOSIS — D6869 Other thrombophilia: Secondary | ICD-10-CM

## 2020-10-31 LAB — CBC
HCT: 45.7 % (ref 39.0–52.0)
Hemoglobin: 14.8 g/dL (ref 13.0–17.0)
MCH: 31.9 pg (ref 26.0–34.0)
MCHC: 32.4 g/dL (ref 30.0–36.0)
MCV: 98.5 fL (ref 80.0–100.0)
Platelets: 336 10*3/uL (ref 150–400)
RBC: 4.64 MIL/uL (ref 4.22–5.81)
RDW: 12 % (ref 11.5–15.5)
WBC: 7 10*3/uL (ref 4.0–10.5)
nRBC: 0 % (ref 0.0–0.2)

## 2020-10-31 LAB — BASIC METABOLIC PANEL
Anion gap: 9 (ref 5–15)
BUN: 19 mg/dL (ref 8–23)
CO2: 22 mmol/L (ref 22–32)
Calcium: 9.3 mg/dL (ref 8.9–10.3)
Chloride: 107 mmol/L (ref 98–111)
Creatinine, Ser: 1.09 mg/dL (ref 0.61–1.24)
GFR, Estimated: >60 mL/min (ref >60)
Glucose, Bld: 99 mg/dL (ref 70–99)
Potassium: 3.8 mmol/L (ref 3.5–5.1)
Sodium: 138 mmol/L (ref 135–145)

## 2020-10-31 NOTE — Patient Instructions (Signed)
Cardioversion scheduled for Tuesday June 28  - Arrive at the [facility] and go to admitting at 8:30am  -Do not eat or drink anything after midnight the night prior to your procedure.  - Take all your morning medication with a sip of water prior to arrival.  - You will not be able to drive home after your procedure.

## 2020-10-31 NOTE — Progress Notes (Signed)
Primary Care Physician: Haywood Pao, MD Primary Cardiologist: Dr Harrington Challenger Primary Electrophysiologist: Dr Quentin Ore Referring Physician: Dr Quentin Ore   Derrick White is a 80 y.o. male with a history of prior CVA, cerebellar ataxia, and atrial fibrillation who presents for follow up in the [facility]. Patient is on Eliquis for a CHADS2VASC score of 5. Patient was seen by Dr Quentin Ore for [facility] device consideration. He is not felt to be a candidate for long term anticoagulation due to his high fall risk with cerebellar ataxia. Patient is s/p Watchman implant 07/27/20. He was noted to be in persistent afib with symptoms of fatigue and underwent TEE/DCCV on 09/13/20.  On follow up today, patient reports that he feels more fatigued in afib. Patient seen 10/10/20 by Chanetta Marshall and was started on amiodarone. His BB was stopped due to low BP readings at home. He felt poorly on higher doses of amiodarone so he was titrated down to once daily.   Today, he denies symptoms of palpitations, chest pain, shortness of breath, orthopnea, PND, lower extremity edema, dizziness, presyncope, syncope, snoring, daytime somnolence, bleeding. The patient is tolerating medications without difficulties and is otherwise without complaint today.    Atrial Fibrillation Risk Factors:  he does not have symptoms or diagnosis of sleep apnea. he does not have a history of rheumatic fever.   he has a BMI of Body mass index is 25.54 kg/m.Marland Kitchen Filed Weights   10/31/20 1042  Weight: 80.7 kg     Family History  Problem Relation Age of Onset   Stroke Neg Hx      Atrial Fibrillation Management history:  Previous antiarrhythmic drugs: amiodarone  Previous cardioversions: 09/13/20 Previous ablations: none CHADS2VASC score: 5 Anticoagulation history: Eliquis   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   Cerebellar ataxia ([place])    Dysesthesia 06/04/2016    Essential hypertension 11/05/2019   Foot pain, 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   Gait disturbance 03/16/2019   History of cervical spinal surgery 06/04/2016   History of hiatal hernia    History of kidney stones    Hypertension    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   Memory loss 03/16/2019   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   Pain due to onychomycosis of toenails of both feet 04/28/2019   Pain in left ankle and joints of left foot 08/06/2018   Slurred speech 03/16/2019   Superficial peroneal nerve neuropathy, 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   TIA (transient ischemic attack) 11/05/2019   Worsened handwriting 03/16/2019   Past Surgical History:  Procedure Laterality Date   [place] N/A 09/13/2020   Procedure: CARDIOVERSION;  Surgeon: Sanda Klein, MD;  Location: [place];  Service: Cardiovascular;  Laterality: N/A;   FRACTURE SURGERY     Left ankle, sx x4   INGUINAL HERNIA REPAIR Right 03/05/2019   Procedure: OPEN RIGHT INGUINAL HERNIA REPAIR WITH MESH;  Surgeon: Clovis Riley, MD;  Location: [place];  Service: General;  Laterality: Right;   [place] N/A 07/27/2020   Procedure: LEFT ATRIAL APPENDAGE OCCLUSION;  Surgeon: Sherren Mocha, MD;  Location: [place] [facility];  Service: Cardiovascular;  Laterality: N/A;   SHOULDER SURGERY Left    TEE WITHOUT CARDIOVERSION N/A 07/27/2020   Procedure: TRANSESOPHAGEAL ECHOCARDIOGRAM (TEE);  Surgeon: Sherren Mocha, MD;  Location: [place] [facility];  Service: Cardiovascular;  Laterality: N/A;   TEE WITHOUT CARDIOVERSION N/A 09/13/2020   Procedure: TRANSESOPHAGEAL ECHOCARDIOGRAM (TEE);  Surgeon: Sanda Klein, MD;  [facility]: [facility];  Service: Cardiovascular;  Laterality: N/A;   TONSILLECTOMY     1945    Current Outpatient Medications  Medication Sig Dispense Refill   acetaminophen (TYLENOL) 500 MG tablet Take 500 mg by mouth every 6 (six)  hours as needed for moderate pain or headache.     amiodarone (PACERONE) 200 MG tablet Take 1 tablet (200 mg total) by mouth daily. 90 tablet 3   atorvastatin (LIPITOR) 40 MG tablet Take 1 tablet (40 mg total) by mouth daily. 90 tablet 3   Cholecalciferol 25 MCG (1000 UT) tablet Take 1,000 Units by mouth every evening.     diphenhydramine-acetaminophen (TYLENOL PM) 25-500 MG TABS tablet Take 1 tablet by mouth at bedtime as needed (sleep).     ELIQUIS 5 MG TABS tablet TAKE 1 TABLET TWICE A DAY 180 tablet 1   fluticasone (FLONASE) 50 MCG/ACT nasal spray Place 1 spray into both nostrils daily as needed for allergies or rhinitis.     oxybutynin (DITROPAN-XL) 10 MG 24 hr tablet Take 10 mg by mouth at bedtime.     pantoprazole (PROTONIX) 40 MG tablet Take 40 mg by mouth daily.   2   Propylene Glycol (SYSTANE BALANCE) 0.6 % SOLN Place 1 drop into both eyes daily as needed (burning).     telmisartan (MICARDIS) 80 MG tablet Take 80 mg by mouth daily.      vitamin E 1000 UNIT capsule Take 1,000 Units by mouth daily.     No current facility-administered medications for this encounter.    Allergies  Allergen Reactions   Morphine Hives and Itching   Codeine Hives and Itching   Propoxyphene Itching    DARVOCET   Shrimp [Shellfish Allergy] Rash    Social History   Socioeconomic History   Marital status: Married    Spouse name: Not on file   Number of children: Not on file   Years of education: Not on file   Highest education level: Not on file  Occupational History   Occupation: retired  Tobacco Use   Smoking status: Former    Packs/day: 1.00    Years: 30.00    Pack years: 30.00    Types: Cigarettes    Quit date: 1995    Years since quitting: 27.4   Smokeless tobacco: Never  Vaping Use   Vaping Use: Never used  Substance and Sexual Activity   Alcohol use: Yes    Alcohol/week: 16.0 standard drinks    Types: 14 Cans of beer, 2 Standard drinks or equivalent per week    Comment: 2  beers a day   Drug use: Never   Sexual activity: Not on file  Other Topics Concern   Not on file  Social History Narrative   Not on file   Social Determinants of Health   Financial Resource Strain: Not on file  Food Insecurity: Not on file  Transportation Needs: Not on file  Physical Activity: Not on file  Stress: Not on file  Social Connections: Not on file  Intimate Partner Violence: Not on file     ROS- All systems are reviewed and negative except as per the HPI above.  Physical Exam: Vitals:   10/31/20 1042  BP: 132/80  Pulse: 79  Weight: 80.7 kg  Height: 5\' 10"  (1.778 m)    GEN- The patient is a  well appearing elderly male, alert and oriented x 3 today.   HEENT-head normocephalic, atraumatic, sclera clear, conjunctiva pink, hearing intact, trachea midline. Lungs- Clear to ausculation bilaterally, normal work of breathing Heart- irregular rate and rhythm, no murmurs, rubs or gallops  GI- soft, NT, ND, + BS Extremities- no clubbing, cyanosis, or edema MS- no significant deformity or atrophy Skin- no rash or lesion Psych- euthymic mood, full affect Neuro- ataxic gait   Wt Readings from Last 3 Encounters:  10/31/20 80.7 kg  10/10/20 81.8 kg  10/06/20 80.1 kg    EKG today demonstrates  Coarse afib Vent. rate 79 BPM PR interval * ms QRS duration 86 ms QT/QTcB 370/424 ms  Echo 11/05/19 demonstrated  1. Left ventricular ejection fraction, by estimation, is 60 to 65%. The  left ventricle has normal function. The left ventricle has no regional  wall motion abnormalities. Left ventricular diastolic parameters were  normal.   2. Right ventricular systolic function is normal. The right ventricular  size is normal. There is normal pulmonary artery systolic pressure.   3. The mitral valve is normal in structure. No evidence of mitral valve  regurgitation. No evidence of mitral stenosis.   4. The aortic valve is normal in structure. Aortic valve regurgitation is   not visualized. No aortic stenosis is present.   5. Aortic dilatation noted. There is borderline dilatation of the aortic  root.   6. The inferior vena cava is normal in size with greater than 50%  respiratory variability, suggesting right atrial pressure of 3 mmHg.   Cardiac CT 07/14/20 Large Wind-sock appendage with prominent lateral trabeculation with maximum landing zone diameter 23 mm Suitable for a 27 Watchman FLX device with 14.8% compression  TEE 09/13/20  1. Left ventricular ejection fraction, by estimation, is 60 to 65%. The  left ventricle has normal function. The left ventricle has no regional  wall motion abnormalities.   2. Right ventricular systolic function is normal. The right ventricular  size is normal.   3. Well seated Watchman device in the left atrial appendage, without  leak.. Left atrial size was mildly dilated. No left atrial/left atrial  appendage thrombus was detected.   4. The mitral valve is normal in structure. No evidence of mitral valve  regurgitation.   5. The aortic valve is tricuspid. Aortic valve regurgitation is trivial.  No aortic stenosis is present.   6. Aortic dilatation noted. There is mild dilatation of the aortic root,  measuring 39 mm. There is mild (Grade II) plaque involving the descending  aorta.   7. Evidence of atrial level shunting detected by color flow Doppler (s/p  transseptal puncture). 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    Epic records are reviewed at length today  HAS-BLED score 3 Hypertension Yes  Abnormal renal and liver function (Dialysis, transplant, Cr >2.26 mg/dL /Cirrhosis or Bilirubin >2x Normal or AST/ALT/AP >3x Normal) No  Stroke Yes  Bleeding No  Labile INR (Unstable/high INR) No  Elderly (>65) Yes  Drugs or alcohol (? 8 drinks/week, anti-plt or NSAID) No   CHA2DS2-VASc Score = 5  The patient's score is based upon: CHF  History: No HTN History: Yes Diabetes History: No Stroke History: Yes Vascular Disease History: No Age Score: 2 Gender Score: 0      ASSESSMENT AND PLAN: 1. Persistent Atrial Fibrillation The patient's CHA2DS2-VASc score is 5, indicating a 7.2% annual risk of stroke.  S/p Watchman implant 07/27/20. TEE 09/13/20 showed well seated device, no leaks or thrombus. Will arrange for repeat DCCV now that he has loaded on amiodarone.  Check bmet/cbc Continue amiodarone 200 mg daily He will need to continue Eliquis 5 mg BID for an additional month post repeat DCCV.  2. 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  Continue Apixaban (Eliquis). See plans above.  3. HTN Stable, no changes today.   Follow up with AF clinic post DCCV. Dr Quentin Ore as scheduled.    [facility] [address] [place], [place] [address] [phone] 10/31/2020 10:51 AM

## 2020-11-02 ENCOUNTER — Ambulatory Visit: Payer: Medicare Other | Admitting: Physical Therapy

## 2020-11-02 ENCOUNTER — Other Ambulatory Visit: Payer: Self-pay

## 2020-11-02 DIAGNOSIS — R2681 Unsteadiness on feet: Secondary | ICD-10-CM | POA: Diagnosis not present

## 2020-11-02 DIAGNOSIS — M6281 Muscle weakness (generalized): Secondary | ICD-10-CM | POA: Diagnosis not present

## 2020-11-02 DIAGNOSIS — R270 Ataxia, unspecified: Secondary | ICD-10-CM

## 2020-11-03 NOTE — Therapy (Signed)
[facility] [address] [place], [place], [address] Phone: [phone]   Fax:  [phone]  Physical Therapy Treatment  Patient Details  Name: Derrick White MRN: [id number] Date of Birth: 1940-09-25 Referring Provider (PT): Dorris Carnes MD   Encounter Date: 11/02/2020   PT End of Session - 11/02/20 0937     Visit Number 2    Number of Visits 17    Date for PT Re-Evaluation 12/25/20    Authorization Type Medicare/[facility]- 10th visit progress notes due, Re-cert 12/22/14    Progress Note Due on Visit 10    PT Start Time 0933    PT Stop Time 1015    PT Time Calculation (min) 42 min    Equipment Utilized During Treatment Gait belt    Activity Tolerance Patient tolerated treatment well    Behavior During Therapy [facility] for tasks assessed/performed             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   Cerebellar ataxia ([place])    Dysesthesia 06/04/2016   Essential hypertension 11/05/2019   Foot pain, 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   Gait disturbance 03/16/2019   History of cervical spinal surgery 06/04/2016   History of hiatal hernia    History of kidney stones    Hypertension    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   Memory loss 03/16/2019   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   Pain due to onychomycosis of toenails of both feet 04/28/2019   Pain in left ankle and joints of left foot 08/06/2018   Slurred speech 03/16/2019   Superficial peroneal nerve neuropathy, 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   TIA (transient ischemic attack) 11/05/2019   Worsened handwriting 03/16/2019    Past Surgical History:  Procedure Laterality Date   [place] N/A 09/13/2020   Procedure: CARDIOVERSION;  Surgeon: Sanda Klein, MD;  Location: [place];  Service: Cardiovascular;  Laterality: N/A;   FRACTURE SURGERY     Left ankle, sx x4   INGUINAL HERNIA REPAIR  Right 03/05/2019   Procedure: OPEN RIGHT INGUINAL HERNIA REPAIR WITH MESH;  Surgeon: Clovis Riley, MD;  Location: [place];  Service: General;  Laterality: Right;   [place] N/A 07/27/2020   Procedure: LEFT ATRIAL APPENDAGE OCCLUSION;  Surgeon: Sherren Mocha, MD;  Location: [place] [facility];  Service: Cardiovascular;  Laterality: N/A;   SHOULDER SURGERY Left    TEE WITHOUT CARDIOVERSION N/A 07/27/2020   Procedure: TRANSESOPHAGEAL ECHOCARDIOGRAM (TEE);  Surgeon: Sherren Mocha, MD;  Location: [place] [facility];  Service: Cardiovascular;  Laterality: N/A;   TEE WITHOUT CARDIOVERSION N/A 09/13/2020   Procedure: TRANSESOPHAGEAL ECHOCARDIOGRAM (TEE);  Surgeon: Sanda Klein, MD;  Location: [facility] END[facility];  Service: Cardiovascular;  Laterality: N/A;   TONSILLECTOMY     1945    There were no vitals filed for this visit.   Subjective Assessment - 11/02/20 0936     Subjective No new complaints. No falls or pain to report. Saw Cardiologist yesterday, he is in A-fib and out of rhythm. He is going for a cardioversion in about 2 weeks. On medications to help control the A-fib at this time.    Pertinent History HTN, cerebellar ataxia, oropharyngeal dysphagia, Sialorrhea, Chronic L ankle pain    Limitations Walking;Other (comment)    How long can you sit comfortably? no  issues    How long can you stand comfortably? no issues    How long can you walk comfortably? 1/4 mile    Diagnostic tests 11/05/19: IMPRESSION:1. 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 Moderate chronic small vessel ischemic disease.3. Motion degraded head MRA without evidence of a large or mediumvessel occlusion or significant proximal stenosis.4. Negative neck MRA.  IMPRESSION:1. 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 Mild spinal stenosis and mild-to-moderate neural  foraminalstenosis at C6-7, stable to minimally progressed.    Patient Stated Goals walk 1 mile comfortably, negotiate stairs better    Currently in Pain? No/denies    Pain Score 0-No pain                [facility] PT Assessment - 11/02/20 0940       Standardized Balance Assessment   Standardized Balance Assessment Berg Balance Test      Berg Balance Test   Sit to Stand Able to stand without using hands and stabilize independently    Standing Unsupported Able to stand safely 2 minutes    Sitting with Back Unsupported but Feet Supported on Floor or Stool Able to sit safely and securely 2 minutes    Stand to Sit Sits safely with minimal use of hands    Transfers Able to transfer safely, definite need of hands    Standing Unsupported with Eyes Closed Able to stand 10 seconds safely    Standing Unsupported with Feet Together Able to place feet together independently and stand 1 minute safely    From Standing, Reach Forward with Outstretched Arm Can reach confidently >25 cm (10")   10 inches   From Standing Position, Pick up Object from Floor Able to pick up shoe safely and easily    From Standing Position, Turn to Look Behind Over each Shoulder Looks behind one side only/other side shows less weight shift   right>left due to neck surgery limits left side   Turn 360 Degrees Able to turn 360 degrees safely but slowly   6-7 sec's both ways   Standing Unsupported, Alternately Place Feet on Step/Stool Able to stand independently and safely and complete 8 steps in 20 seconds   14.62 sec's   Standing Unsupported, One Foot in Front Able to plae foot ahead of the other independently and hold 30 seconds    Standing on One Leg Able to lift leg independently and hold 5-10 seconds   7 sec's   Total Score 50    Berg comment: 50/56= moderate risk for falls (>50%)      Functional Gait  Assessment   Gait assessed  Yes    Gait Level Surface Walks 20 ft, slow speed, abnormal gait pattern, evidence for  imbalance or deviates 10-15 in outside of the 12 in walkway width. Requires more than 7 sec to ambulate 20 ft.   12.40 sec's   Change in Gait Speed Able to change speed, demonstrates mild gait deviations, deviates 6-10 in outside of the 12 in walkway width, or no gait deviations, unable to achieve a major change in velocity, or uses a change in velocity, or uses an assistive device.    Gait with Horizontal Head Turns Performs head turns smoothly with slight change in gait velocity (eg, minor disruption to smooth gait path), deviates 6-10 in outside 12 in walkway width, or uses an assistive device.    Gait with Vertical Head Turns Performs  task with moderate change in gait velocity, slows down, deviates 10-15 in outside 12 in walkway width but recovers, can continue to walk.    Gait and Pivot Turn Turns slowly, requires verbal cueing, or requires several small steps to catch balance following turn and stop   >4 sec's with stepping needed   Step Over Obstacle Is able to step over one shoe box (4.5 in total height) but must slow down and adjust steps to clear box safely. May require verbal cueing.    Gait with Narrow Base of Support Ambulates less than 4 steps heel to toe or cannot perform without assistance.    Gait with Eyes Closed Cannot walk 20 ft without assistance, severe gait deviations or imbalance, deviates greater than 15 in outside 12 in walkway width or will not attempt task.   17.06 sec's with min guard to min assist   Ambulating Backwards Walks 20 ft, slow speed, abnormal gait pattern, evidence for imbalance, deviates 10-15 in outside 12 in walkway width.   24.78 sec's, min guard assist with no physical assistance needed   Steps Alternating feet, must use rail.    Total Score 11    FGA comment: 11/30. <19 is high risk fall                  [facility] Adult PT Treatment/Exercise - 11/02/20 0940       Transfers   Transfers Sit to Stand;Stand to Sit    Sit to Stand 5: Supervision;With  upper extremity assist;From bed;From chair/3-in-1    Stand to Sit 5: Supervision;With upper extremity assist;To bed;To chair/3-in-1      Ambulation/Gait   Ambulation/Gait Yes    Ambulation/Gait Assistance 5: Supervision;4: Min guard    Assistive device Straight cane    Gait Pattern Decreased arm swing - left;Decreased step length - right;Ataxic;Narrow base of support    Ambulation Surface Level;Indoor                      PT Short Term Goals - 10/23/20 1112       PT SHORT TERM GOAL #1   Title Patient to demo initial HEP back to PT    Baseline TBD    Time 4    Period Weeks    Status New    Target Date 11/27/20      PT SHORT TERM GOAL #2   Title Patient will be able to ambulate 54ft across outdoor surface with LRAD under S    Baseline 115 ft with cane and SBA    Time 4    Period Weeks    Status New    Target Date 11/27/20      PT SHORT TERM GOAL #3   Title Patient to negotiate steps with single UE and cane with reciprocal pattern    Baseline B UEs on rail w/o cane 4 steps    Time 4    Period Weeks    Status New    Target Date 11/27/20      PT SHORT TERM GOAL #4   Title Patient to perform 5x STS in <11s w/o UE support w/o bracing knees against seat    Baseline 5x STS in 12.78s w/o UE assist while bracing knees against seat    Time 4    Period Weeks    Status New    Target Date 11/27/20               PT Long Term  Goals - 11/02/20 1630       PT LONG TERM GOAL #1   Title Patient to perform TUG test in <13s with and w/o AD. (all LTGs due 12/25/20)    Baseline Best Tug time 16.0s using cane    Time 8    Period Weeks    Status On-going      PT LONG TERM GOAL #2   Title Patient to ambulate 1074ft with LRAD across outdoor surfaces    Baseline 170ft with SPC and SBA indoor surfaces    Time 8    Period Weeks    Status On-going      PT LONG TERM GOAL #3   Title Patient to ambulate at 0.8 m/s with [facility]    Baseline Initial gait velocity 0.48 m/s     Time 8    Period Weeks    Status On-going      PT LONG TERM GOAL #4   Title Assess progress towards BERG    Baseline 11/02/20: baseline score of 50/56    Time 8    Period Weeks    Status New      PT LONG TERM GOAL #5   Title Assess progress towards FGA/DGI    Baseline 11/02/20: baseline score of 11/30    Time 8    Period Weeks    Status New                   Plan - 11/02/20 0937     Clinical Impression Statement Today's skilled session focused on settting baseline scores for Berg Balance Test and Funcional Gait Assessment. Pt scored 50/56 on the Berg placing him in the moderate risk for falls category. Pt scored 11/30 on the FGA, <19 indicates high fall risk. Primary PT to update goals based on these scores. The pt should benefit from continued PT to progress toward unmet goals.    Personal Factors and Comorbidities Age;Comorbidity 2;Past/Current Experience;Time since onset of injury/illness/exacerbation    Comorbidities HTN, cerebellar ataxia, oropharyngeal dysphagia, Sialorrhea, Chronic R ankle pain    Examination-Activity Limitations Carry;Lift;Squat;Stairs;Stand;Transfers    Examination-Participation Restrictions Community Activity;Laundry;Shop;Yard Work    [employer] Evolving/Moderate complexity    Rehab Potential Good    PT Frequency 2x / week    PT Duration 8 weeks    PT Treatment/Interventions Patient/family education;ADLs/Self Care Home Management;Aquatic Therapy;DME Instruction;Gait training;Stair training;Functional mobility training;Therapeutic activities;Therapeutic exercise;Balance training;Neuromuscular re-education    PT Next Visit Plan establish HEP; Primary PT to updated BERG and FGA goals.    PT Home Exercise Plan [email or web address](from previous episode)    Consulted and Agree with Plan of Care Patient             Patient will benefit from skilled therapeutic intervention in order to improve the following deficits and impairments:   Abnormal gait, Decreased balance, Decreased activity tolerance, Decreased endurance, Decreased coordination, Decreased mobility, Decreased range of motion, Decreased strength, Difficulty walking, Postural dysfunction, Pain  Visit Diagnosis: Muscle weakness (generalized)  Ataxia  Unsteadiness on feet     Problem List Patient Active Problem List   Diagnosis Date Noted   Persistent atrial fibrillation ([place]) 08/28/2020   Secondary hypercoagulable state ([place]) 07/20/2020   Paroxysmal atrial fibrillation ([place]) 06/19/2020   TIA (transient ischemic attack) 11/05/2019   Essential hypertension 11/05/2019   Cerebellar ataxia ([place]) 11/05/2019   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   C6 radiculopathy 05/04/2019   Pain due to onychomycosis of toenails of both feet 04/28/2019  Worsened handwriting 03/16/2019   Gait disturbance 03/16/2019   Slurred speech 03/16/2019   Memory loss 03/16/2019   Pain in left ankle and joints of left foot 08/06/2018   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   Foot pain, 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   Dysesthesia 06/04/2016   Superficial peroneal nerve neuropathy, 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

## 2020-11-13 ENCOUNTER — Ambulatory Visit: Payer: Medicare Other

## 2020-11-13 ENCOUNTER — Other Ambulatory Visit: Payer: Self-pay

## 2020-11-13 ENCOUNTER — Telehealth: Payer: Self-pay | Admitting: Internal Medicine

## 2020-11-13 ENCOUNTER — Other Ambulatory Visit (HOSPITAL_COMMUNITY): Payer: Self-pay | Admitting: *Deleted

## 2020-11-13 DIAGNOSIS — R270 Ataxia, unspecified: Secondary | ICD-10-CM | POA: Diagnosis not present

## 2020-11-13 DIAGNOSIS — R2681 Unsteadiness on feet: Secondary | ICD-10-CM

## 2020-11-13 DIAGNOSIS — H0100B Unspecified blepharitis left eye, upper and lower eyelids: Secondary | ICD-10-CM | POA: Diagnosis not present

## 2020-11-13 DIAGNOSIS — M6281 Muscle weakness (generalized): Secondary | ICD-10-CM | POA: Diagnosis not present

## 2020-11-13 DIAGNOSIS — I4819 Other persistent atrial fibrillation: Secondary | ICD-10-CM

## 2020-11-13 DIAGNOSIS — H0100A Unspecified blepharitis right eye, upper and lower eyelids: Secondary | ICD-10-CM | POA: Diagnosis not present

## 2020-11-13 DIAGNOSIS — H10413 Chronic giant papillary conjunctivitis, bilateral: Secondary | ICD-10-CM | POA: Diagnosis not present

## 2020-11-13 NOTE — Patient Instructions (Signed)
Access Code: [email or web address] URL: [email or web address] Date: 11/13/2020 Prepared by: Sharlynn Oliphant  Exercises Heel Toe Raises with Counter Support - 2 x daily - 7 x weekly - 2 sets - 10 reps Gastroc Stretch on Wall - 2 x daily - 7 x weekly - 2 sets - 1 reps - 30s hold

## 2020-11-13 NOTE — Telephone Encounter (Signed)
     Pt c/o medication issue:  1. Name of Medication: Eliquis  2. How are you currently taking this medication (dosage and times per day)?   3. Are you having a reaction (difficulty breathing--STAT)?   4. What is your medication issue? Pt's wife calling, she said someone from [facility] called them regarding his cardioversion tomorrow, one of the question was if pt missed a dose of his eliquis, she said pt did miss a dose last 06/21 and was advised to call Dr. Harrington Challenger and need to ask if pt need to r/s cardioversion.

## 2020-11-13 NOTE — Telephone Encounter (Signed)
Spoke with pt and wife (OK per DPR) in regards to missed eliquis dose.  He reports that he missed morning dose of eliquis 5 mg PO on 11/06/20.  Per pt he has not missed any other doses.  Spoke with Dr. Acie Fredrickson who is scheduled to perform DCCV on 11/14/20.  He recommends that ordering provider order a TEE.  I called afib clinic spoke with [facility], [place].  She will call and order TEE and follow up with patient.

## 2020-11-13 NOTE — Telephone Encounter (Signed)
TEE added per Brandi in endo and pt notified.

## 2020-11-13 NOTE — Therapy (Signed)
[facility] [address] [place], [place], [address] Phone: [phone]   Fax:  [phone]  Physical Therapy Treatment  Patient Details  Name: Derrick White MRN: [id number] Date of Birth: [birth year] Referring Provider (PT): Dorris Carnes MD   Encounter Date: 11/13/2020   PT End of Session - 11/13/20 1346     Visit Number 3    Number of Visits 17    Date for PT Re-Evaluation 12/25/20    Authorization Type Medicare/Mutal of [facility]- 10th visit progress notes due, Re-cert 10/24/32    Authorization - Number of Visits 16    Progress Note Due on Visit 10    PT Start Time 1100    PT Stop Time 1145    PT Time Calculation (min) 45 min    Equipment Utilized During Treatment Gait belt    Activity Tolerance Patient tolerated treatment well    Behavior During Therapy [facility] for tasks assessed/performed             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   Cerebellar ataxia ([place])    Dysesthesia 06/04/2016   Essential hypertension 11/05/2019   Foot pain, 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   Gait disturbance 03/16/2019   History of cervical spinal surgery 06/04/2016   History of hiatal hernia    History of kidney stones    Hypertension    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   Memory loss 03/16/2019   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   Pain due to onychomycosis of toenails of both feet 04/28/2019   Pain in left ankle and joints of left foot 08/06/2018   Slurred speech 03/16/2019   Superficial peroneal nerve neuropathy, 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   TIA (transient ischemic attack) 11/05/2019   Worsened handwriting 03/16/2019    Past Surgical History:  Procedure Laterality Date   [place] N/A 09/13/2020   Procedure: CARDIOVERSION;  Surgeon: Sanda Klein, MD;  Location: [place];  Service: Cardiovascular;  Laterality: N/A;   FRACTURE SURGERY     Left  ankle, sx x4   INGUINAL HERNIA REPAIR Right 03/05/2019   Procedure: OPEN RIGHT INGUINAL HERNIA REPAIR WITH MESH;  Surgeon: Clovis Riley, MD;  Location: [facility];  Service: General;  Laterality: Right;   [place] N/A 07/27/2020   Procedure: LEFT ATRIAL APPENDAGE OCCLUSION;  Surgeon: Sherren Mocha, MD;  Location: [place] CV LAB;  Service: Cardiovascular;  Laterality: N/A;   SHOULDER SURGERY Left    TE[place] WITHOUT CARDIOVERSION N/A 07/27/2020   Procedure: TRANSESOPHAGEAL ECHOCARDIOGRAM (TEE);  Surgeon: Sherren Mocha, MD;  Location: [employer] CV LAB;  Service: Cardiovascular;  Laterality: N/A;   TEE WITHOUT CARDIOVERSION N/A 09/13/2020   Procedure: TRANSESOPHAGEAL ECHOCARDIOGRAM (TEE);  Surgeon: Sanda Klein, MD;  Location: [facility] ENDOSCOPY;  Service: Cardiovascular;  Laterality: N/A;   TONSILLECTOMY     1945    There were no vitals filed for this visit.   Subjective Assessment - 11/13/20 1123     Subjective No new complaints. No falls or pain to report. No med changes to note    Pertinent History HTN, cerebellar ataxia, oropharyngeal dysphagia, Sialorrhea, Chronic L ankle pain    Limitations Walking;Other (comment)    How long can you sit comfortably? no issues    How long can you stand comfortably? no issues    How long  can you walk comfortably? 1/4 mile    Diagnostic tests 11/05/19: IMPRESSION:1. 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 Moderate chronic small vessel ischemic disease.3. Motion degraded head MRA without evidence of a large or mediumvessel occlusion or significant proximal stenosis.4. Negative neck MRA.  IMPRESSION:1. 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 Mild spinal stenosis and mild-to-moderate neural foraminalstenosis at C6-7, stable to minimally progressed.    Patient Stated Goals walk 1 mile comfortably,  negotiate stairs better    Pain Onset More than a month ago                               [facility] Adult PT Treatment/Exercise - 11/13/20 0001       Transfers   Transfers Sit to Stand    Sit to Stand 4: Min assist    Stand to Sit 4: Min guard    Comments performed from [employer] with SBA to defer bracing knees against mat table      Ambulation/Gait   Ambulation/Gait Yes    Ambulation/Gait Assistance 4: Min guard    Ambulation Distance (Feet) 1000 Feet    Assistive device Straight cane    Gait Pattern Decreased arm swing - left;Decreased step length - right;Ataxic;Narrow base of support    Ambulation Surface Level;Unlevel;Indoor;Outdoor;Grass      Posture/Postural Control   Posture Comments ataxic                 Balance Exercises - 11/13/20 0001       Balance Exercises: Standing   Standing Eyes Opened Foam/compliant surface    Tandem Stance Eyes open;30 secs;Limitations    Tandem Stance Time 30s ea. position followed by head turns 5x in ea. foot position.                 PT Short Term Goals - 11/13/20 1349       PT SHORT TERM GOAL #1   Title Patient to demo initial HEP back to PT    Baseline TBD; 11/13/20 Initial HEP established    Time 4    Period Weeks    Status Achieved    Target Date 11/27/20      PT SHORT TERM GOAL #2   Title Patient will be able to ambulate 526f across outdoor surface with LRAD under S    Baseline 115 ft with cane and SBA; 11/13/20 10033fambulation with SPC and CGA across paved, sloped and grassy surfaces    Time 4    Period Weeks    Status Partially Met    Target Date 11/27/20      PT SHORT TERM GOAL #3   Title Patient to negotiate steps with single UE and cane with reciprocal pattern    Baseline B UEs on rail w/o cane 4 steps    Time 4    Period Weeks    Status New    Target Date 11/27/20      PT SHORT TERM GOAL #4   Title Patient to perform 5x STS in <11s w/o UE support w/o bracing knees against seat     Baseline 5x STS in 12.78s w/o UE assist while bracing knees against seat    Time 4    Period Weeks    Status New    Target Date 11/27/20  PT Long Term Goals - 11/13/20 1351       PT LONG TERM GOAL #1   Title Patient to perform TUG test in <13s with and w/o AD. (all LTGs due 12/25/20)    Baseline Best Tug time 16.0s using cane    Time 8    Period Weeks    Status On-going      PT LONG TERM GOAL #2   Title Patient to ambulate 1026ft with LRAD across outdoor surfaces    Baseline 176ft with SPC and SBA indoor surfaces    Time 8    Period Weeks    Status On-going      PT LONG TERM GOAL #3   Title Patient to ambulate at 0.8 m/s with [facility]    Baseline Initial gait velocity 0.48 m/s    Time 8    Period Weeks    Status On-going      PT LONG TERM GOAL #4   Title Assess progress towards BERG; 11/13/20 BERG goal is 52    Baseline 11/02/20: baseline score of 50/56    Time 8    Period Weeks    Status New      PT LONG TERM GOAL #5   Title Assess progress towards FGA/DGI; 11/13/20 FGA goal is 20    Baseline 11/02/20: baseline score of 11/30    Time 8    Period Weeks    Status New                   Plan - 11/13/20 1347     Clinical Impression Statement Todays skilled session assessed outside ambulatin across grassy and sloped surfaces, ability to perform fig 8s, open and close doors w/o LOB and update to his HEP.  B heelcords exhibited tightness and appropriate stretches were given to perform at home    Personal Factors and Comorbidities Age;Comorbidity 2;Past/Current Experience;Time since onset of injury/illness/exacerbation    Comorbidities HTN, cerebellar ataxia, oropharyngeal dysphagia, Sialorrhea, Chronic R ankle pain    Examination-Activity Limitations Carry;Lift;Squat;Stairs;Stand;Transfers    Examination-Participation Restrictions Community Activity;Laundry;Shop;Yard Work    Conservation officer, historic buildings Evolving/Moderate complexity    Rehab  Potential Good    PT Frequency 2x / week    PT Duration 8 weeks    PT Treatment/Interventions Patient/family education;ADLs/Self Care Home Management;Aquatic Therapy;DME Instruction;Gait training;Stair training;Functional mobility training;Therapeutic activities;Therapeutic exercise;Balance training;Neuromuscular re-education    PT Next Visit Plan establish HEP; Primary PT to updated BERG and FGA goals.    PT Home Exercise Plan [id number](from previous episode)    Consulted and Agree with Plan of Care Patient             Patient will benefit from skilled therapeutic intervention in order to improve the following deficits and impairments:  Abnormal gait, Decreased balance, Decreased activity tolerance, Decreased endurance, Decreased coordination, Decreased mobility, Decreased range of motion, Decreased strength, Difficulty walking, Postural dysfunction, Pain  Visit Diagnosis: Muscle weakness (generalized)  Ataxia  Unsteadiness on feet     Problem List Patient Active Problem List   Diagnosis Date Noted   Persistent atrial fibrillation (HCC) 08/28/2020   Secondary hypercoagulable state (HCC) 07/20/2020   Paroxysmal atrial fibrillation (HCC) 06/19/2020   TIA (transient ischemic attack) 11/05/2019   Essential hypertension 11/05/2019   Cerebellar ataxia ([facility]) 11/05/2019   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   C6 radiculopathy 05/04/2019   Pain due to onychomycosis of toenails of both feet 04/28/2019   Worsened handwriting 03/16/2019   Gait disturbance 03/16/2019  Slurred speech 03/16/2019   Memory loss 03/16/2019   Pain in left ankle and joints of left foot 08/06/2018   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   Foot pain, 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   Dysesthesia 06/04/2016   Superficial peroneal nerve neuropathy, 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

## 2020-11-14 ENCOUNTER — Encounter (HOSPITAL_COMMUNITY): Payer: Self-pay | Admitting: Anesthesiology

## 2020-11-14 ENCOUNTER — Ambulatory Visit (HOSPITAL_COMMUNITY): Payer: Medicare Other

## 2020-11-14 ENCOUNTER — Ambulatory Visit (HOSPITAL_COMMUNITY)
Admission: RE | Admit: 2020-11-14 | Discharge: 2020-11-14 | Disposition: A | Discharge status: Home / Self Care | Payer: Medicare Other | Attending: Cardiovascular Disease | Admitting: Cardiovascular Disease

## 2020-11-14 ENCOUNTER — Encounter (HOSPITAL_COMMUNITY)
Admission: RE | Disposition: A | Discharge status: Home / Self Care | Payer: Self-pay | Source: Home / Self Care | Attending: Cardiovascular Disease

## 2020-11-14 DIAGNOSIS — Z5309 Procedure and treatment not carried out because of other contraindication: Secondary | ICD-10-CM | POA: Diagnosis not present

## 2020-11-14 DIAGNOSIS — I4891 Unspecified atrial fibrillation: Secondary | ICD-10-CM | POA: Insufficient documentation

## 2020-11-14 SURGERY — CANCELLED PROCEDURE

## 2020-11-14 NOTE — Progress Notes (Signed)
Pt's cardioversion cancelled today per Dr. Acie Fredrickson because the pt was in NSR, confirmed with 12 lead EKG. Jobe Igo, RN

## 2020-11-16 ENCOUNTER — Ambulatory Visit: Payer: Medicare Other | Admitting: Physical Therapy

## 2020-11-16 ENCOUNTER — Encounter: Payer: Self-pay | Admitting: Physical Therapy

## 2020-11-16 ENCOUNTER — Other Ambulatory Visit: Payer: Self-pay

## 2020-11-16 DIAGNOSIS — R270 Ataxia, unspecified: Secondary | ICD-10-CM | POA: Diagnosis not present

## 2020-11-16 DIAGNOSIS — R2681 Unsteadiness on feet: Secondary | ICD-10-CM

## 2020-11-16 DIAGNOSIS — M6281 Muscle weakness (generalized): Secondary | ICD-10-CM | POA: Diagnosis not present

## 2020-11-16 NOTE — Therapy (Signed)
[facility] [address] [place], [place], [address] Phone: [phone]   Fax:  [phone]  Physical Therapy Treatment  Patient Details  Name: Derrick White MRN: [id number] Date of Birth: 07-Sep-1940 Referring Provider (PT): Dorris Carnes MD   Encounter Date: 11/16/2020   PT End of Session - 11/16/20 1838     Visit Number 4    Number of Visits 17    Date for PT Re-Evaluation 12/25/20    Authorization Type Medicare/Mutal of [facility]- 10th visit progress notes due, Re-cert 10/25/99    Authorization - Number of Visits 16    Progress Note Due on Visit 10    PT Start Time 1017    PT Stop Time 1101    PT Time Calculation (min) 44 min    Equipment Utilized During Treatment Gait belt    Activity Tolerance Patient tolerated treatment well    Behavior During Therapy [facility] for tasks assessed/performed             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   Cerebellar ataxia ([place])    Dysesthesia 06/04/2016   Essential hypertension 11/05/2019   Foot pain, 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   Gait disturbance 03/16/2019   History of cervical spinal surgery 06/04/2016   History of hiatal hernia    History of kidney stones    Hypertension    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   Memory loss 03/16/2019   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   Pain due to onychomycosis of toenails of both feet 04/28/2019   Pain in left ankle and joints of left foot 08/06/2018   Slurred speech 03/16/2019   Superficial peroneal nerve neuropathy, 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   TIA (transient ischemic attack) 11/05/2019   Worsened handwriting 03/16/2019    Past Surgical History:  Procedure Laterality Date   [place] N/A 09/13/2020   Procedure: CARDIOVERSION;  Surgeon: Sanda Klein, MD;  Location: [place];  Service: Cardiovascular;  Laterality: N/A;   FRACTURE SURGERY     Left  ankle, sx x4   INGUINAL HERNIA REPAIR Right 03/05/2019   Procedure: OPEN RIGHT INGUINAL HERNIA REPAIR WITH MESH;  Surgeon: Clovis Riley, MD;  Location: [facility];  Service: General;  Laterality: Right;   [place] N/A 07/27/2020   Procedure: LEFT ATRIAL APPENDAGE OCCLUSION;  Surgeon: Sherren Mocha, MD;  Location: [facility] CV LAB;  Service: Cardiovascular;  Laterality: N/A;   SHOULDER SURGERY Left    TEE WITHOUT CARDIOVERSION N/A 07/27/2020   Procedure: TRANSESOPHAGEAL ECHOCARDIOGRAM (TEE);  Surgeon: Sherren Mocha, MD;  Location: [place] CV LAB;  Service: Cardiovascular;  Laterality: N/A;   TEE WITHOUT CARDIOVERSION N/A 09/13/2020   Procedure: TRANSESOPHAGEAL ECHOCARDIOGRAM (TEE);  Surgeon: Sanda Klein, MD;  Location: [facility] ENDOSCOPY;  Service: Cardiovascular;  Laterality: N/A;   TONSILLECTOMY     1945    There were no vitals filed for this visit.   Subjective Assessment - 11/16/20 1821     Subjective Pt reports no new problems or changes - states he needs to work on his balance    Pertinent History HTN, cerebellar ataxia, oropharyngeal dysphagia, Sialorrhea, Chronic L ankle pain    Limitations Walking;Other (comment)    How long can you sit comfortably? no issues    How long can you stand comfortably? no issues  How long can you walk comfortably? 1/4 mile    Diagnostic tests 11/05/19: IMPRESSION:1. 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 Moderate chronic small vessel ischemic disease.3. Motion degraded head MRA without evidence of a large or mediumvessel occlusion or significant proximal stenosis.4. Negative neck MRA.  IMPRESSION:1. 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 Mild spinal stenosis and mild-to-moderate neural foraminalstenosis at C6-7, stable to minimally progressed.    Patient Stated Goals walk 1 mile  comfortably, negotiate stairs better    Currently in Pain? No/denies    Pain Onset More than a month ago                               [facility] Adult PT Treatment/Exercise - 11/16/20 1040       Transfers   Transfers Sit to Stand;Stand to Sit    Sit to Stand 5: Supervision    Sit to Stand Details (indicate cue type and reason) no UE support from mat used      Ambulation/Gait   Ambulation/Gait Yes    Ambulation/Gait Assistance 4: Min guard    Ambulation Distance (Feet) 250 Feet    Assistive device None    Gait Pattern Ataxic    Ambulation Surface Level;Indoor      Neuro Re-ed    Neuro Re-ed Details  Pt performed kicking bean bags with alternating feet for improved coordination and SLS on each leg                 Balance Exercises - 11/16/20 0001       Balance Exercises: Standing   Rockerboard Anterior/posterior;EO;10 reps;Intermittent UE support    Retro Gait 2 reps   inside // bars   Sidestepping 2 reps   inside // bars   Step Over Hurdles / Cones stepping over hurdle 10 reps each leg with CGA with UE support on counter    Marching Solid surface;Static;10 reps    Other Standing Exercises braiding 2 reps inside // bars with UE support prn    Other Standing Exercises Comments cone taps to 2 cones, progressing to tipping cone over and standing upright            White ladder on floor - pt performed negotiation using step over step sequence 4 reps with CGA to min assist; then stepping out/in  For improved coordination and balance - +1 min assist for recovery of LOB  Stepping stones (3) used - called out colors - pt performed touching each one in order called for improved coordination - performed with each foot With min assist for balance   PT Short Term Goals - 11/16/20 1845       PT SHORT TERM GOAL #1   Title Patient to demo initial HEP back to PT    Baseline TBD; 11/13/20 Initial HEP established    Time 4    Period Weeks    Status Achieved     Target Date 11/27/20      PT SHORT TERM GOAL #2   Title Patient will be able to ambulate 541f across outdoor surface with LRAD under S    Baseline 115 ft with cane and SBA; 11/13/20 10072fambulation with SPC and CGA across paved, sloped and grassy surfaces    Time 4    Period Weeks    Status Partially Met    Target Date  11/27/20      PT SHORT TERM GOAL #3   Title Patient to negotiate steps with single UE and cane with reciprocal pattern    Baseline B UEs on rail w/o cane 4 steps    Time 4    Period Weeks    Status New    Target Date 11/27/20      PT SHORT TERM GOAL #4   Title Patient to perform 5x STS in <11s w/o UE support w/o bracing knees against seat    Baseline 5x STS in 12.78s w/o UE assist while bracing knees against seat    Time 4    Period Weeks    Status New    Target Date 11/27/20               PT Long Term Goals - 11/16/20 1845       PT LONG TERM GOAL #1   Title Patient to perform TUG test in <13s with and w/o AD. (all LTGs due 12/25/20)    Baseline Best Tug time 16.0s using cane    Time 8    Period Weeks    Status On-going      PT LONG TERM GOAL #2   Title Patient to ambulate 1084f with LRAD across outdoor surfaces    Baseline 1147fwith SPC and SBA indoor surfaces    Time 8    Period Weeks    Status On-going      PT LONG TERM GOAL #3   Title [facility]  Baseline Initial gait velocity 0.48 m/s    Time 8    Period Weeks    Status On-going      PT LONG TERM GOAL #4   Title Assess progress towards BERG; 11/13/20 BERG goal is 52    Baseline 11/02/20: baseline score of 50/56    Time 8    Period Weeks    Status New      PT LONG TERM GOAL #5   Title Assess progress towards FGA/DGI; 11/13/20 FGA goal is 20    Baseline 11/02/20: baseline score of 11/30    Time 8    Period Weeks    Status New                   Plan - 11/16/20 1839     Clinical Impression Statement Pt presents with ataxia and  decreased cooordination.  Pt needs UE support for safety with SLS activities on each leg; pt initially had > difficulty with accurately moving RLE on/off steps for coordination exercise but improved with practice and repetition.  Pt needed min assist with ladder negotiation activity due to decreased balance and motor control in bil. LE's.  SPC was not used in today's session.  Cont with POC.    Personal Factors and Comorbidities Age;Comorbidity 2;Past/Current Experience;Time since onset of injury/illness/exacerbation    Comorbidities HTN, cerebellar ataxia, oropharyngeal dysphagia, Sialorrhea, Chronic R ankle pain    Examination-Activity Limitations Carry;Lift;Squat;Stairs;Stand;Transfers    Examination-Participation Restrictions Community Activity;Laundry;Shop;Yard Work    [employer] officervolving/Moderate complexity    Rehab Potential Good    PT Frequency 2x / week    PT Duration 8 weeks    PT Treatment/Interventions Patient/family education;ADLs/Self Care Home Management;Aquatic Therapy;DME Instruction;Gait training;Stair training;Functional mobility training;Therapeutic activities;Therapeutic exercise;Balance training;Neuromuscular re-education    PT Next Visit Plan establish balance HEP - cont balance and dynamic gait activities    PT Home Exercise Plan  [id number](from previous episode)    Consulted and Agree with Plan of Care Patient             Patient will benefit from skilled therapeutic intervention in order to improve the following deficits and impairments:  Abnormal gait, Decreased balance, Decreased activity tolerance, Decreased endurance, Decreased coordination, Decreased mobility, Decreased range of motion, Decreased strength, Difficulty walking, Postural dysfunction, Pain  Visit Diagnosis: Ataxia  Unsteadiness on feet     Problem List Patient Active Problem List   Diagnosis Date Noted   Persistent atrial fibrillation ([place]) 08/28/2020   Secondary  hypercoagulable state ([place]) 07/20/2020   Paroxysmal atrial fibrillation ([place]) 06/19/2020   TIA (transient ischemic attack) 11/05/2019   Essential hypertension 11/05/2019   Cerebellar ataxia ([place]) 11/05/2019   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   C6 radiculopathy 05/04/2019   Pain due to onychomycosis of toenails of both feet 04/28/2019   Worsened handwriting 03/16/2019   Gait disturbance 03/16/2019   Slurred speech 03/16/2019   Memory loss 03/16/2019   Pain in left ankle and joints of left foot 08/06/2018   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   Foot pain, 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   Dysesthesia 06/04/2016   Superficial peroneal nerve neuropathy, 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

## 2020-11-21 ENCOUNTER — Encounter (HOSPITAL_COMMUNITY): Payer: Self-pay | Admitting: Physician Assistant

## 2020-11-21 ENCOUNTER — Ambulatory Visit (HOSPITAL_COMMUNITY)
Admission: RE | Admit: 2020-11-21 | Discharge: 2020-11-21 | Disposition: A | Discharge status: Home / Self Care | Payer: Medicare Other | Source: Ambulatory Visit | Attending: Physician Assistant | Admitting: Physician Assistant

## 2020-11-21 ENCOUNTER — Other Ambulatory Visit: Payer: Self-pay

## 2020-11-21 ENCOUNTER — Ambulatory Visit: Payer: Medicare Other | Attending: Internal Medicine

## 2020-11-21 VITALS — BP 122/66 | HR 69 | Ht 70.0 in | Wt 176.6 lb

## 2020-11-21 DIAGNOSIS — D6869 Other thrombophilia: Secondary | ICD-10-CM | POA: Insufficient documentation

## 2020-11-21 DIAGNOSIS — R2681 Unsteadiness on feet: Secondary | ICD-10-CM | POA: Insufficient documentation

## 2020-11-21 DIAGNOSIS — M6281 Muscle weakness (generalized): Secondary | ICD-10-CM | POA: Insufficient documentation

## 2020-11-21 DIAGNOSIS — Z885 Allergy status to narcotic agent status: Secondary | ICD-10-CM | POA: Diagnosis not present

## 2020-11-21 DIAGNOSIS — I119 Hypertensive heart disease without heart failure: Secondary | ICD-10-CM | POA: Insufficient documentation

## 2020-11-21 DIAGNOSIS — Z7901 Long term (current) use of anticoagulants: Secondary | ICD-10-CM | POA: Insufficient documentation

## 2020-11-21 DIAGNOSIS — I4819 Other persistent atrial fibrillation: Secondary | ICD-10-CM | POA: Diagnosis not present

## 2020-11-21 DIAGNOSIS — R270 Ataxia, unspecified: Secondary | ICD-10-CM | POA: Insufficient documentation

## 2020-11-21 DIAGNOSIS — Z79899 Other long term (current) drug therapy: Secondary | ICD-10-CM | POA: Diagnosis not present

## 2020-11-21 DIAGNOSIS — Z87891 Personal history of nicotine dependence: Secondary | ICD-10-CM | POA: Diagnosis not present

## 2020-11-21 DIAGNOSIS — M25572 Pain in left ankle and joints of left foot: Secondary | ICD-10-CM | POA: Diagnosis not present

## 2020-11-21 MED ORDER — CLOPIDOGREL BISULFATE 75 MG PO TABS: 75.0000 mg | ORAL_TABLET | Freq: Every day | ORAL | 3 refills | Status: DC

## 2020-11-21 NOTE — Progress Notes (Signed)
Primary Care Physician: Haywood Pao, MD Primary Cardiologist: Dr Harrington Challenger Primary Electrophysiologist: Dr Quentin Ore Referring Physician: Dr Josefine Class is a 80 y.o. male with a history of prior CVA, cerebellar ataxia, and atrial fibrillation who presents for follow up in the [facility]. Patient is on Eliquis for a CHADS2VASC score of 5. Patient was seen by Dr Quentin Ore for [facility] device consideration. He is not felt to be a candidate for long term anticoagulation due to his high fall risk with cerebellar ataxia. Patient is s/p Watchman implant 07/27/20. He was noted to be in persistent afib with symptoms of fatigue and underwent TEE/DCCV on 09/13/20 with recurrence of his afib. Patient seen 10/10/20 by Chanetta Marshall and was started on amiodarone. His BB was stopped due to low BP readings at home.   On follow up today, patient presented for DCCV and was in SR, procedure cancelled. He reports that he feels better in SR with much more energy. He denies any bleeding issues on anticoagulation.   Today, he denies symptoms of palpitations, chest pain, shortness of breath, orthopnea, PND, lower extremity edema, dizziness, presyncope, syncope, snoring, daytime somnolence, bleeding. The patient is tolerating medications without difficulties and is otherwise without complaint today.    Atrial Fibrillation Risk Factors:  he does not have symptoms or diagnosis of sleep apnea. he does not have a history of rheumatic fever.   he has a BMI of Body mass index is 25.34 kg/m.Marland Kitchen Filed Weights   11/21/20 1007  Weight: 80.1 kg      Family History  Problem Relation Age of Onset   Stroke Neg Hx      Atrial Fibrillation Management history:  Previous antiarrhythmic drugs: amiodarone  Previous cardioversions: 09/13/20 Previous ablations: none CHADS2VASC score: 5 Anticoagulation history: Eliquis   Past Medical History:  Diagnosis Date   C6 radiculopathy  05/04/2019   Cerebellar ataxia ([place])    Dysesthesia 06/04/2016   Essential hypertension 11/05/2019   Foot pain, 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   Gait disturbance 03/16/2019   History of cervical spinal surgery 06/04/2016   History of hiatal hernia    History of kidney stones    Hypertension    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   Memory loss 03/16/2019   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   Pain due to onychomycosis of toenails of both feet 04/28/2019   Pain in left ankle and joints of left foot 08/06/2018   Slurred speech 03/16/2019   Superficial peroneal nerve neuropathy, 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   TIA (transient ischemic attack) 11/05/2019   Worsened handwriting 03/16/2019   Past Surgical History:  Procedure Laterality Date   [place] N/A 09/13/2020   Procedure: CARDIOVERSION;  Surgeon: Sanda Klein, MD;  Location: [place];  Service: Cardiovascular;  Laterality: N/A;   FRACTURE SURGERY     Left ankle, sx x4   INGUINAL HERNIA REPAIR Right 03/05/2019   Procedure: OPEN RIGHT INGUINAL HERNIA REPAIR WITH MESH;  Surgeon: Clovis Riley, MD;  Location: [place];  Service: General;  Laterality: Right;   [place] N/A 07/27/2020   Procedure: LEFT ATRIAL APPENDAGE OCCLUSION;  Surgeon: Sherren Mocha, MD;  Location: [place] [facility];  Service: Cardiovascular;  Laterality: N/A;   SHOULDER SURGERY Left    TEE WITHOUT CARDIOVERSION N/A 07/27/2020   Procedure: TRANSESOPHAGEAL ECHOCARDIOGRAM (TEE);  Surgeon: Sherren Mocha, MD;  Location: [place] [facility];  Service: Cardiovascular;  Laterality: N/A;   TEE WITHOUT CARDIOVERSION N/A 09/13/2020   Procedure: TRANSESOPHAGEAL ECHOCARDIOGRAM (TEE);  Surgeon: Sanda Klein, MD;  [facility];  Service: Cardiovascular;  Laterality: N/A;   TONSILLECTOMY     1945    Current Outpatient Medications  Medication Sig Dispense Refill    acetaminophen (TYLENOL) 500 MG tablet Take 500 mg by mouth every 6 (six) hours as needed for moderate pain or headache.     amiodarone (PACERONE) 200 MG tablet Take 1 tablet (200 mg total) by mouth daily. 90 tablet 3   atorvastatin (LIPITOR) 40 MG tablet Take 1 tablet (40 mg total) by mouth daily. 90 tablet 3   Cholecalciferol 25 MCG (1000 UT) tablet Take 1,000 Units by mouth at bedtime.     diphenhydramine-acetaminophen (TYLENOL PM) 25-500 MG TABS tablet Take 1 tablet by mouth at bedtime as needed (sleep).     ELIQUIS 5 MG TABS tablet TAKE 1 TABLET TWICE A DAY 180 tablet 1   fluticasone (FLONASE) 50 MCG/ACT nasal spray Place 1 spray into both nostrils daily as needed for allergies or rhinitis.     neomycin-polymyxin b-dexamethasone (MAXITROL) 3.5-10000-0.1 SUSP SMARTSIG:In Eye(s)     pantoprazole (PROTONIX) 40 MG tablet Take 40 mg by mouth in the morning.  2   Probiotic Product (ALIGN) 4 MG CAPS Take by mouth in the morning.     Propylene Glycol (SYSTANE BALANCE) 0.6 % SOLN Place 1 drop into both eyes 2 (two) times daily as needed (burning).     telmisartan (MICARDIS) 80 MG tablet Take 80 mg by mouth at bedtime.     vitamin B-12 (CYANOCOBALAMIN) 1000 MCG tablet Take 1,000 mcg by mouth at bedtime.     vitamin E 180 MG (400 UNITS) capsule Take 400 Units by mouth at bedtime.     No current facility-administered medications for this encounter.    Allergies  Allergen Reactions   Morphine Hives and Itching   Codeine Hives and Itching   Propoxyphene Itching    DARVOCET   Shrimp [Shellfish Allergy] Rash    Social History   Socioeconomic History   Marital status: Married    Spouse name: Not on file   Number of children: Not on file   Years of education: Not on file   Highest education level: Not on file  Occupational History   Occupation: retired  Tobacco Use   Smoking status: Former    Packs/day: 1.00    Years: 30.00    Pack years: 30.00    Types: Cigarettes    Quit date: 1995     Years since quitting: 27.5   Smokeless tobacco: Never  Vaping Use   Vaping Use: Never used  Substance and Sexual Activity   Alcohol use: Yes    Alcohol/week: 16.0 standard drinks    Types: 14 Cans of beer, 2 Standard drinks or equivalent per week    Comment: 2 beers a day   Drug use: Never   Sexual activity: Not on file  Other Topics Concern   Not on file  Social History Narrative   Not on file   Social Determinants of Health   Financial Resource Strain: Not on file  Food Insecurity: Not on file  Transportation Needs: Not on file  Physical Activity: Not on file  Stress: Not on file  Social Connections: Not on file  Intimate Partner Violence: Not on file     ROS-  All systems are reviewed and negative except as per the HPI above.  Physical Exam: Vitals:   11/21/20 1007  BP: 122/66  Pulse: 69  Weight: 80.1 kg  Height: 5\' 10"  (1.778 m)    GEN- The patient is a well appearing elderly male, alert and oriented x 3 today.   HEENT-head normocephalic, atraumatic, sclera clear, conjunctiva pink, hearing intact, trachea midline. Lungs- Clear to ausculation bilaterally, normal work of breathing Heart- Regular rate and rhythm, no murmurs, rubs or gallops  GI- soft, NT, ND, + BS Extremities- no clubbing, cyanosis, or edema MS- no significant deformity or atrophy Skin- no rash or lesion Psych- euthymic mood, full affect Neuro- ataxic gait   Wt Readings from Last 3 Encounters:  11/21/20 80.1 kg  10/31/20 80.7 kg  10/10/20 81.8 kg    EKG today demonstrates  SR, 1st degree AV block Vent. rate 69 BPM PR interval 212 ms QRS duration 88 ms QT/QTcB 420/450 ms  Echo 11/05/19 demonstrated  1. Left ventricular ejection fraction, by estimation, is 60 to 65%. The  left ventricle has normal function. The left ventricle has no regional  wall motion abnormalities. Left ventricular diastolic parameters were  normal.   2. Right ventricular systolic function is normal. The right  ventricular  size is normal. There is normal pulmonary artery systolic pressure.   3. The mitral valve is normal in structure. No evidence of mitral valve  regurgitation. No evidence of mitral stenosis.   4. The aortic valve is normal in structure. Aortic valve regurgitation is  not visualized. No aortic stenosis is present.   5. Aortic dilatation noted. There is borderline dilatation of the aortic  root.   6. The inferior vena cava is normal in size with greater than 50%  respiratory variability, suggesting right atrial pressure of 3 mmHg.   Cardiac CT 07/14/20 Large Wind-sock appendage with prominent lateral trabeculation with maximum landing zone diameter 23 mm Suitable for a 27 Watchman FLX device with 14.8% compression  TEE 09/13/20  1. Left ventricular ejection fraction, by estimation, is 60 to 65%. The  left ventricle has normal function. The left ventricle has no regional  wall motion abnormalities.   2. Right ventricular systolic function is normal. The right ventricular  size is normal.   3. Well seated Watchman device in the left atrial appendage, without  leak.. Left atrial size was mildly dilated. No left atrial/left atrial  appendage thrombus was detected.   4. The mitral valve is normal in structure. No evidence of mitral valve  regurgitation.   5. The aortic valve is tricuspid. Aortic valve regurgitation is trivial.  No aortic stenosis is present.   6. Aortic dilatation noted. There is mild dilatation of the aortic root,  measuring 39 mm. There is mild (Grade II) plaque involving the descending  aorta.   7. Evidence of atrial level shunting detected by color flow Doppler (s/p  transseptal puncture). 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    Epic records are reviewed at length today  HAS-BLED score 3 Hypertension Yes  Abnormal renal and liver function (Dialysis, transplant, Cr >2.26 mg/dL  /Cirrhosis or Bilirubin >2x Normal or AST/ALT/AP >3x Normal) No  Stroke Yes  Bleeding No  Labile INR (Unstable/high INR) No  Elderly (>65) Yes  Drugs or alcohol (? 8 drinks/week, anti-plt or NSAID) No   CHA2DS2-VASc Score = 5  The patient's score is based upon: CHF  History: No HTN History: Yes Diabetes History: No Stroke History: Yes Vascular Disease History: No Age Score: 2 Gender Score: 0      ASSESSMENT AND PLAN: 1. Persistent Atrial Fibrillation The patient's CHA2DS2-VASc score is 5, indicating a 7.2% annual risk of stroke.  S/p Watchman implant 07/27/20. TEE 09/13/20 showed well seated device, no leaks or thrombus. Continue amiodarone 200 mg daily He will need to continue Eliquis 5 mg BID for an additional month post chemical conversion. On 12/14/20 will stop Eliquis and start ASA 81 mg daily and Plavix 75 mg daily.  2. 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  Continue Apixaban (Eliquis). See plans above.  3. HTN Stable, no changes today.   Follow up with Dr Quentin Ore as scheduled.    [facility] [address] [place], [place] [address] [phone] 11/21/2020 10:26 AM

## 2020-11-21 NOTE — Patient Instructions (Signed)
On July 28th - STOP Eliquis  START Plavix 75mg  once a day and Aspirin 81mg  once a day

## 2020-11-21 NOTE — Therapy (Signed)
[facility] [address] [place], [place], [address] Phone: [phone]   Fax:  [phone]  Physical Therapy Treatment  Patient Details  Name: Derrick White MRN: [id number] Date of Birth: 07-Apr-1941 Referring Provider (PT): Dorris Carnes MD   Encounter Date: 11/21/2020   PT End of Session - 11/21/20 1405     Visit Number 5    Number of Visits 17    Date for PT Re-Evaluation 12/25/20    Authorization Type Medicare/[employer] [facility]- 10th visit progress notes due, Re-cert 06/26/31    Authorization - Number of Visits 16    Progress Note Due on Visit 10    PT Start Time 1320    PT Stop Time 1400    PT Time Calculation (min) 40 min    Equipment Utilized During Treatment Gait belt    Activity Tolerance Patient tolerated treatment well    Behavior During Therapy [facility] for tasks assessed/performed             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   Cerebellar ataxia ([place])    Dysesthesia 06/04/2016   Essential hypertension 11/05/2019   Foot pain, 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   Gait disturbance 03/16/2019   History of cervical spinal surgery 06/04/2016   History of hiatal hernia    History of kidney stones    Hypertension    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   Memory loss 03/16/2019   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   Pain due to onychomycosis of toenails of both feet 04/28/2019   Pain in left ankle and joints of left foot 08/06/2018   Slurred speech 03/16/2019   Superficial peroneal nerve neuropathy, 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   TIA (transient ischemic attack) 11/05/2019   Worsened handwriting 03/16/2019    Past Surgical History:  Procedure Laterality Date   [place] N/A 09/13/2020   Procedure: CARDIOVERSION;  Surgeon: Sanda Klein, MD;  Location: [place];  Service: Cardiovascular;  Laterality: N/A;   FRACTURE SURGERY     Left  ankle, sx x4   INGUINAL HERNIA REPAIR Right 03/05/2019   Procedure: OPEN RIGHT INGUINAL HERNIA REPAIR WITH MESH;  Surgeon: Clovis Riley, MD;  Location: [place];  Service: General;  Laterality: Right;   [place] N/A 07/27/2020   Procedure: LEFT ATRIAL APPENDAGE OCCLUSION;  Surgeon: Sherren Mocha, MD;  Location: [place] [facility];  Service: Cardiovascular;  Laterality: N/A;   SHOULDER SURGERY Left    TEE WITHOUT CARDIOVERSION N/A 07/27/2020   Procedure: TRANSESOPHAGEAL ECHOCARDIOGRAM (TEE);  Surgeon: Sherren Mocha, MD;  Location: [place] [facility];  Service: Cardiovascular;  Laterality: N/A;   TEE WITHOUT CARDIOVERSION N/A 09/13/2020   Procedure: TRANSESOPHAGEAL ECHOCARDIOGRAM (TEE);  Surgeon: Sanda Klein, MD;  Location: [facility] ENDOSCOPY;  Service: Cardiovascular;  Laterality: N/A;   TONSILLECTOMY     1945    There were no vitals filed for this visit.   Subjective Assessment - 11/21/20 1327     Subjective No new issues to note, continues to note good days, bad days with performance and balance    Pertinent History HTN, cerebellar ataxia, oropharyngeal dysphagia, Sialorrhea, Chronic L ankle pain    Limitations Walking;Other (comment)    How long can you sit comfortably? no issues    How long can you stand comfortably? no issues  How long can you walk comfortably? 1/4 mile    Diagnostic tests 11/05/19: IMPRESSION:1. 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 Moderate chronic small vessel ischemic disease.3. Motion degraded head MRA without evidence of a large or mediumvessel occlusion or significant proximal stenosis.4. Negative neck MRA.  IMPRESSION:1. 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 Mild spinal stenosis and mild-to-moderate neural foraminalstenosis at C6-7, stable to minimally progressed.    Patient Stated Goals walk  1 mile comfortably, negotiate stairs better    Pain Onset More than a month ago                               [facility] Adult PT Treatment/Exercise - 11/21/20 0001       Transfers   Transfers Sit to Stand;Stand to Sit    Sit to Stand 5: Supervision;4: Min guard    Sit to Stand Details (indicate cue type and reason) no UE support    Comments performed 10 reps from Airex      Knee/Hip Exercises: Aerobic   Other Aerobic Scifit 8' L1 arms 6                 Balance Exercises - 11/21/20 0001       Balance Exercises: Standing   Tandem Stance Eyes open;Intermittent upper extremity support;2 reps;30 secs;Limitations    Tandem Stance Time 30s hold, 2x in ea. position    Rockerboard Anterior/posterior;EO;30 seconds;Intermittent UE support;Limitations    Rockerboard Limitations added alt UE flexion/ext 2x15    Marching Solid surface;Intermittent upper extremity assist;10 reps;Limitations    Marching Limitations 1x10    Other Standing Exercises tandem stance on Airex 10 hedad turns in ea. position    Other Standing Exercises Comments cone taps to 2 cones, progressing to alternating taps, 10 reps                 PT Short Term Goals - 11/16/20 1845       PT SHORT TERM GOAL #1   Title Patient to demo initial HEP back to PT    Baseline TBD; 11/13/20 Initial HEP established    Time 4    Period Weeks    Status Achieved    Target Date 11/27/20      PT SHORT TERM GOAL #2   Title Patient will be able to ambulate 561f across outdoor surface with LRAD under S    Baseline 115 ft with cane and SBA; 11/13/20 10043fambulation with SPC and CGA across paved, sloped and grassy surfaces    Time 4    Period Weeks    Status Partially Met    Target Date 11/27/20      PT SHORT TERM GOAL #3   Title Patient to negotiate steps with single UE and cane with reciprocal pattern    Baseline B UEs on rail w/o cane 4 steps    Time 4    Period Weeks    Status New    Target  Date 11/27/20      PT SHORT TERM GOAL #4   Title Patient to perform 5x STS in <11s w/o UE support w/o bracing knees against seat    Baseline 5x STS in 12.78s w/o UE assist while bracing knees against seat    Time 4    Period Weeks    Status New    Target Date  11/27/20               PT Long Term Goals - 11/16/20 1845       PT LONG TERM GOAL #1   Title Patient to perform TUG test in <13s with and w/o AD. (all LTGs due 12/25/20)    Baseline Best Tug time 16.0s using cane    Time 8    Period Weeks    Status On-going      PT LONG TERM GOAL #2   Title Patient to ambulate 1025f with LRAD across outdoor surfaces    Baseline 118fwith SPC and SBA indoor surfaces    Time 8    Period Weeks    Status On-going      PT LONG TERM GOAL #3   Title [facility]  Baseline Initial gait velocity 0.48 m/s    Time 8    Period Weeks    Status On-going      PT LONG TERM GOAL #4   Title Assess progress towards BERG; 11/13/20 BERG goal is 52    Baseline 11/02/20: baseline score of 50/56    Time 8    Period Weeks    Status New      PT LONG TERM GOAL #5   Title Assess progress towards FGA/DGI; 11/13/20 FGA goal is 20    Baseline 11/02/20: baseline score of 11/30    Time 8    Period Weeks    Status New                   Plan - 11/21/20 1406     Clinical Impression Statement Todays session introduced Scifit for UE/LE dissociation tasks, standing balance tasks from compliant surfaces, SLS tasks and use of floor targets to challenge coordination.  Some incerased tone noted when challenged which leads to missteps and some frustration.  Cued to relax and focus on quality and not speed    Personal Factors and Comorbidities Age;Comorbidity 2;Past/Current Experience;Time since onset of injury/illness/exacerbation    Comorbidities HTN, cerebellar ataxia, oropharyngeal dysphagia, Sialorrhea, Chronic R ankle pain    Examination-Activity Limitations  Carry;Lift;Squat;Stairs;Stand;Transfers    Examination-Participation Restrictions Community Activity;Laundry;Shop;Yard Work    [employer] officervolving/Moderate complexity    Rehab Potential Good    PT Frequency 2x / week    PT Duration 8 weeks    PT Treatment/Interventions Patient/family education;ADLs/Self Care Home Management;Aquatic Therapy;DME Instruction;Gait training;Stair training;Functional mobility training;Therapeutic activities;Therapeutic exercise;Balance training;Neuromuscular re-education    PT Next Visit Plan cont balance and dynamic gait activities, focus on quality and accuracy of movement vs speed to improve coordination    PT Home Exercise Plan [id number](from previous episode)    Consulted and Agree with Plan of Care Patient             Patient will benefit from skilled therapeutic intervention in order to improve the following deficits and impairments:  Abnormal gait, Decreased balance, Decreased activity tolerance, Decreased endurance, Decreased coordination, Decreased mobility, Decreased range of motion, Decreased strength, Difficulty walking, Postural dysfunction, Pain  Visit Diagnosis: Ataxia  Unsteadiness on feet  Muscle weakness (generalized)     Problem List Patient Active Problem List   Diagnosis Date Noted   Persistent atrial fibrillation (HCIrvington04/03/2021   Secondary hypercoagulable state (HCTorn[place]03/07/2020   Paroxysmal atrial fibrillation (HCMercer01/31/2022   TIA (transient ischemic attack) 11/05/2019   Essential hypertension 11/05/2019   Cerebellar ataxia (HCClementon06/18/2021   Lumbosacral radiculopathy  at S1 05/04/2019   C6 radiculopathy 05/04/2019   Pain due to onychomycosis of toenails of both feet 04/28/2019   Worsened handwriting 03/16/2019   Gait disturbance 03/16/2019   Slurred speech 03/16/2019   Memory loss 03/16/2019   Pain in left ankle and joints of left foot 08/06/2018   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   Foot  pain, 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   Dysesthesia 06/04/2016   Superficial peroneal nerve neuropathy, 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 [place], [place], [address] Phone: [phone]   Fax:  [phone]  Name: NIKITAS DAVTYAN MRN: [id number] Date of Birth: Dec 10, 1940

## 2020-11-23 ENCOUNTER — Ambulatory Visit: Payer: Medicare Other

## 2020-11-23 ENCOUNTER — Other Ambulatory Visit: Payer: Self-pay

## 2020-11-23 DIAGNOSIS — R2681 Unsteadiness on feet: Secondary | ICD-10-CM

## 2020-11-23 DIAGNOSIS — R270 Ataxia, unspecified: Secondary | ICD-10-CM

## 2020-11-23 DIAGNOSIS — M25572 Pain in left ankle and joints of left foot: Secondary | ICD-10-CM | POA: Diagnosis not present

## 2020-11-23 DIAGNOSIS — M6281 Muscle weakness (generalized): Secondary | ICD-10-CM

## 2020-11-23 NOTE — Therapy (Signed)
[facility] [address] [place], [place], [address] Phone: [phone]   Fax:  [phone]  Physical Therapy Treatment  Patient Details  Name: Derrick White MRN: [id number] Date of Birth: [birth year] Referring Provider (PT): Dorris Carnes MD   Encounter Date: 11/23/2020   PT End of Session - 11/23/20 1242     Visit Number 6    Number of Visits 17    Date for PT Re-Evaluation 12/25/20    Authorization Type Medicare/[facility] of [facility]- 10th visit progress notes due, Re-cert 10/21/36    Authorization - Number of Visits 16    Progress Note Due on Visit 10    PT Start Time 3818    PT Stop Time 1315    PT Time Calculation (min) 45 min    Equipment Utilized During Treatment Gait belt    Activity Tolerance Patient tolerated treatment well    Behavior During Therapy [facility] for tasks assessed/performed             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   Cerebellar ataxia ([place])    Dysesthesia 06/04/2016   Essential hypertension 11/05/2019   Foot pain, 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   Gait disturbance 03/16/2019   History of cervical spinal surgery 06/04/2016   History of hiatal hernia    History of kidney stones    Hypertension    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   Memory loss 03/16/2019   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   Pain due to onychomycosis of toenails of both feet 04/28/2019   Pain in left ankle and joints of left foot 08/06/2018   Slurred speech 03/16/2019   Superficial peroneal nerve neuropathy, 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   TIA (transient ischemic attack) 11/05/2019   Worsened handwriting 03/16/2019    Past Surgical History:  Procedure Laterality Date   [place] N/A 09/13/2020   Procedure: CARDIOVERSION;  Surgeon: Sanda Klein, MD;  Location: [place];  Service: Cardiovascular;  Laterality: N/A;   FRACTURE SURGERY     Left  ankle, sx x4   INGUINAL HERNIA REPAIR Right 03/05/2019   Procedure: OPEN RIGHT INGUINAL HERNIA REPAIR WITH MESH;  Surgeon: Clovis Riley, MD;  Location: [place];  Service: General;  Laterality: Right;   [place] N/A 07/27/2020   Procedure: LEFT ATRIAL APPENDAGE OCCLUSION;  Surgeon: Sherren Mocha, MD;  Location: [place] CV LAB;  Service: Cardiovascular;  Laterality: N/A;   SHOULDER SURGERY Left    [place] WITHOUT CARDIOVERSION N/A 07/27/2020   Procedure: TRANSESOPHAGEAL ECHOCARDIOGRAM (TEE);  Surgeon: Sherren Mocha, MD;  Location: [place] CV [facility];  Service: Cardiovascular;  Laterality: N/A;   TEE WITHOUT CARDIOVERSION N/A 09/13/2020   Procedure: TRANSESOPHAGEAL ECHOCARDIOGRAM (TEE);  Surgeon: Sanda Klein, MD;  [facility]: [facility] ENDOSCOPY;  Service: Cardiovascular;  Laterality: N/A;   TONSILLECTOMY     1945    There were no vitals filed for this visit.   Subjective Assessment - 11/23/20 1236     Subjective Having a good day,    Pertinent History HTN, cerebellar ataxia, oropharyngeal dysphagia, Sialorrhea, Chronic L ankle pain    Limitations Walking;Other (comment)    How long can you sit comfortably? no issues    How long can you stand comfortably? no issues    How long can you walk comfortably? 1/4 mile    Diagnostic  tests 11/05/19: IMPRESSION:1. 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 Moderate chronic small vessel ischemic disease.3. Motion degraded head MRA without evidence of a large or mediumvessel occlusion or significant proximal stenosis.4. Negative neck MRA.  IMPRESSION:1. 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 Mild spinal stenosis and mild-to-moderate neural foraminalstenosis at C6-7, stable to minimally progressed.    Patient Stated Goals walk 1 mile comfortably, negotiate stairs better    Pain Onset More than a  month ago                               [facility] Adult PT Treatment/Exercise - 11/23/20 0001       Knee/Hip Exercises: Aerobic   Other Aerobic Scifit 8' L2 arms 6 targeting 80 steps per minute                 Balance Exercises - 11/23/20 0001       Balance Exercises: Standing   SLS Eyes open;Upper extremity support 1;30 secs;Limitations    SLS Limitations wih=th single UE support, rolling soccer ball a/p for 30sx3 ea. LE    Other Standing Exercises Comments pebble taps to 2 targets unilateral, progressing to alternating taps, 10 reps, cross body tasks then decreasing to single target using cross body tasks                 PT Short Term Goals - 11/16/20 1845       PT SHORT TERM GOAL #1   Title Patient to demo initial HEP back to PT    Baseline TBD; 11/13/20 Initial HEP established    Time 4    Period Weeks    Status Achieved    Target Date 11/27/20      PT SHORT TERM GOAL #2   Title Patient will be able to ambulate 529f across outdoor surface with LRAD under S    Baseline 115 ft with cane and SBA; 11/13/20 10047fambulation with SPC and CGA across paved, sloped and grassy surfaces    Time 4    Period Weeks    Status Partially Met    Target Date 11/27/20      PT SHORT TERM GOAL #3   Title Patient to negotiate steps with single UE and cane with reciprocal pattern    Baseline B UEs on rail w/o cane 4 steps    Time 4    Period Weeks    Status New    Target Date 11/27/20      PT SHORT TERM GOAL #4   Title Patient to perform 5x STS in <11s w/o UE support w/o bracing knees against seat    Baseline 5x STS in 12.78s w/o UE assist while bracing knees against seat    Time 4    Period Weeks    Status New    Target Date 11/27/20               PT Long Term Goals - 11/16/20 1845       PT LONG TERM GOAL #1   Title Patient to perform TUG test in <13s with and w/o AD. (all LTGs due 12/25/20)    Baseline Best Tug time 16.0s using cane     Time 8    Period Weeks    Status On-going      PT LONG TERM GOAL #2   Title  Patient to ambulate 1055f with LRAD across outdoor surfaces    Baseline 1165fwith SPC and SBA indoor surfaces    Time 8    Period Weeks    Status On-going      PT LONG TERM GOAL #3   Title [facility]  Baseline Initial gait velocity 0.48 m/s    Time 8    Period Weeks    Status On-going      PT LONG TERM GOAL #4   Title Assess progress towards BERG; 11/13/20 BERG goal is 52    Baseline 11/02/20: baseline score of 50/56    Time 8    Period Weeks    Status New      PT LONG TERM GOAL #5   Title Assess progress towards FGA/DGI; 11/13/20 FGA goal is 20    Baseline 11/02/20: baseline score of 11/30    Time 8    Period Weeks    Status New                   Plan - 11/23/20 1242     Clinical Impression Statement Todays session continued to focus on LE coordination, balance and stepping tasks, focus placed on accuracy vs. reps or speed.  Performed tasks to attempt to narrow BOS when ambulating as well as pace himself to focus on quality of movement.  Began balance tasks with B UE support, weaning to single UE support.  When cued to concentrate at task son hand, overall accuracy improved hitting floor targets.    Personal Factors and Comorbidities Age;Comorbidity 2;Past/Current Experience;Time since onset of injury/illness/exacerbation    Comorbidities HTN, cerebellar ataxia, oropharyngeal dysphagia, Sialorrhea, Chronic R ankle pain    Examination-Activity Limitations Carry;Lift;Squat;Stairs;Stand;Transfers    Examination-Participation Restrictions Community Activity;Laundry;Shop;Yard Work    StMerchant navy officervolving/Moderate complexity    Rehab Potential Good    PT Frequency 2x / week    PT Duration 8 weeks    PT Treatment/Interventions Patient/family education;ADLs/Self Care Home Management;Aquatic Therapy;DME Instruction;Gait training;Stair  training;Functional mobility training;Therapeutic activities;Therapeutic exercise;Balance training;Neuromuscular re-education    PT Next Visit Plan cont balance and dynamic gait activities, focus on quality and accuracy of movement vs speed to improve coordination    PT Home Exercise Plan [id number](from previous episode)    Consulted and Agree with Plan of Care Patient             Patient will benefit from skilled therapeutic intervention in order to improve the following deficits and impairments:  Abnormal gait, Decreased balance, Decreased activity tolerance, Decreased endurance, Decreased coordination, Decreased mobility, Decreased range of motion, Decreased strength, Difficulty walking, Postural dysfunction, Pain  Visit Diagnosis: Ataxia  Unsteadiness on feet  Muscle weakness (generalized)     Problem List Patient Active Problem List   Diagnosis Date Noted   Persistent atrial fibrillation ([facility]Ed[place]04/03/2021   Secondary hypercoagulable state (HCShanor-North[place]03/07/2020   Paroxysmal atrial fibrillation (HCOl[place]o01/31/2022   TIA (transient ischemic attack) 11/05/2019   Essential hypertension 11/05/2019   Cerebellar ataxia (HCAvalon06/18/2021   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 [place], [place], [address] Phone: [phone]   Fax:  [phone]  Name: LEONEL MCCOLLUM MRN: [id number] Date of  Birth: [birth year]

## 2020-11-27 ENCOUNTER — Other Ambulatory Visit: Payer: Self-pay

## 2020-11-27 ENCOUNTER — Ambulatory Visit: Payer: Medicare Other

## 2020-11-27 DIAGNOSIS — R270 Ataxia, unspecified: Secondary | ICD-10-CM

## 2020-11-27 DIAGNOSIS — R2681 Unsteadiness on feet: Secondary | ICD-10-CM | POA: Diagnosis not present

## 2020-11-27 DIAGNOSIS — M6281 Muscle weakness (generalized): Secondary | ICD-10-CM

## 2020-11-27 DIAGNOSIS — M25572 Pain in left ankle and joints of left foot: Secondary | ICD-10-CM | POA: Diagnosis not present

## 2020-11-27 NOTE — Therapy (Signed)
[facility] [address] [place], [place], [address] Phone: [phone]   Fax:  [phone]  Physical Therapy Treatment  Patient Details  Name: Derrick White MRN: [id number] Date of Birth: 1940-07-07 Referring Provider (PT): Dorris Carnes MD   Encounter Date: 11/27/2020   PT End of Session - 11/27/20 1106     Visit Number 7    Number of Visits 17    Date for PT Re-Evaluation 12/25/20    Authorization Type Medicare/[employer] of [facility]- 10th visit progress notes due, Re-cert 11/24/56    Authorization - Number of Visits 16    Progress Note Due on Visit 10    PT Start Time 1100    PT Stop Time 1145    PT Time Calculation (min) 45 min    Equipment Utilized During Treatment Gait belt    Activity Tolerance Patient tolerated treatment well    Behavior During Therapy [facility] for tasks assessed/performed             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   Cerebellar ataxia ([place])    Dysesthesia 06/04/2016   Essential hypertension 11/05/2019   Foot pain, 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   Gait disturbance 03/16/2019   History of cervical spinal surgery 06/04/2016   History of hiatal hernia    History of kidney stones    Hypertension    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   Memory loss 03/16/2019   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   Pain due to onychomycosis of toenails of both feet 04/28/2019   Pain in left ankle and joints of left foot 08/06/2018   Slurred speech 03/16/2019   Superficial peroneal nerve neuropathy, 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   TIA (transient ischemic attack) 11/05/2019   Worsened handwriting 03/16/2019    Past Surgical History:  Procedure Laterality Date   [place] N/A 09/13/2020   Procedure: CARDIOVERSION;  Surgeon: Sanda Klein, MD;  Location: [place];  Service: Cardiovascular;  Laterality: N/A;   FRACTURE SURGERY     Left  ankle, sx x4   INGUINAL HERNIA REPAIR Right 03/05/2019   Procedure: OPEN RIGHT INGUINAL HERNIA REPAIR WITH MESH;  Surgeon: Clovis Riley, MD;  Location: [place];  Service: General;  Laterality: Right;   [place] N/A 07/27/2020   Procedure: LEFT ATRIAL APPENDAGE OCCLUSION;  Surgeon: Sherren Mocha, MD;  Location: [place] CV LAB;  Service: Cardiovascular;  Laterality: N/A;   SHOULDER SURGERY Left    TEE WITHOUT CARDIOVERSION N/A 07/27/2020   Procedure: TRANSESOPHAGEAL ECHOCARDIOGRAM (TEE);  Surgeon: Sherren Mocha, MD;  Location: [place] CV LAB;  Service: Cardiovascular;  Laterality: N/A;   TEE WITHOUT CARDIOVERSION N/A 09/13/2020   Procedure: TRANSESOPHAGEAL ECHOCARDIOGRAM (TEE);  Surgeon: Sanda Klein, MD;  Location: [facility] ENDOSCOPY;  Service: Cardiovascular;  Laterality: N/A;   TONSILLECTOMY     1945    There were no vitals filed for this visit.   Subjective Assessment - 11/27/20 1103     Subjective Reporting no pain or falls, feels he has no energy due to a-fib.    Pertinent History HTN, cerebellar ataxia, oropharyngeal dysphagia, Sialorrhea, Chronic L ankle pain    Limitations Walking;Other (comment)    How long can you sit comfortably? no issues    How long can you stand comfortably? no issues    How long can  you walk comfortably? 1/4 mile    Diagnostic tests 11/05/19: IMPRESSION:1. 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 Moderate chronic small vessel ischemic disease.3. Motion degraded head MRA without evidence of a large or mediumvessel occlusion or significant proximal stenosis.4. Negative neck MRA.  IMPRESSION:1. 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 Mild spinal stenosis and mild-to-moderate neural foraminalstenosis at C6-7, stable to minimally progressed.    Patient Stated Goals walk 1 mile comfortably,  negotiate stairs better    Pain Onset More than a month ago                               [facility] Adult PT Treatment/Exercise - 11/27/20 0001       Transfers   Transfers Sit to Stand;Stand to Sit    Sit to Stand 5: Supervision    Sit to Stand Details (indicate cue type and reason) no need of UE assist    Number of Reps 10 reps    Comments did not count times patient braced kneees against table      Ambulation/Gait   Ambulation/Gait Yes    Ambulation/Gait Assistance 4: Min guard    Ambulation Distance (Feet) 500 Feet    Assistive device Straight cane    Gait Pattern Ataxic    Ambulation Surface Unlevel;Outdoor;Paved    Stairs Yes    Stairs Assistance 5: Supervision    Stair Management Technique Alternating pattern;One rail Left;With cane    Number of Stairs 16      Knee/Hip Exercises: Aerobic   Other Aerobic Scifit 8' L2 arms 8 targeting 80 steps per minute                 Balance Exercises - 11/27/20 0001       Balance Exercises: Standing   Other Standing Exercises normal and tandem stance on airex while performing alt UE flexion/ext to simulate ambulation, 15 reps per UE                 PT Short Term Goals - 11/27/20 1126       PT SHORT TERM GOAL #1   Title Patient to demo initial HEP back to PT    Baseline TBD; 11/13/20 Initial HEP established    Time 4    Period Weeks    Status Achieved    Target Date 11/27/20      PT SHORT TERM GOAL #2   Title Patient will be able to ambulate 557ft across outdoor surface with LRAD under S    Baseline 115 ft with cane and SBA; 11/13/20 1010ft ambulation with SPC and CGA across paved, sloped and grassy surfaces    Time 4    Period Weeks    Status Partially Met    Target Date 11/27/20      PT SHORT TERM GOAL #3   Title Patient to negotiate steps with single UE and cane with reciprocal pattern    Baseline B UEs on rail w/o cane 4 steps    Time 4    Period Weeks    Status New    Target Date  11/27/20      PT SHORT TERM GOAL #4   Title Patient to perform 5x STS in <11s w/o UE support w/o bracing knees against seat    Baseline 5x STS in 12.78s w/o UE assist while bracing knees against  seat; 11/27/20 Able to complete in 9.4s but braced knees at times    Time 4    Period Weeks    Status Partially Met    Target Date 11/27/20               PT Long Term Goals - 11/16/20 1845       PT LONG TERM GOAL #1   Title Patient to perform TUG test in <13s with and w/o AD. (all LTGs due 12/25/20)    Baseline Best Tug time 16.0s using cane    Time 8    Period Weeks    Status On-going      PT LONG TERM GOAL #2   Title Patient to ambulate 1056ft with LRAD across outdoor surfaces    Baseline 175ft with SPC and SBA indoor surfaces    Time 8    Period Weeks    Status On-going      PT LONG TERM GOAL #3   Title Patient to ambulate at 0.8 m/s with [facility]    Baseline Initial gait velocity 0.48 m/s    Time 8    Period Weeks    Status On-going      PT LONG TERM GOAL #4   Title Assess progress towards BERG; 11/13/20 BERG goal is 52    Baseline 11/02/20: baseline score of 50/56    Time 8    Period Weeks    Status New      PT LONG TERM GOAL #5   Title Assess progress towards FGA/DGI; 11/13/20 FGA goal is 20    Baseline 11/02/20: baseline score of 11/30    Time 8    Period Weeks    Status New                   Plan - 11/27/20 1145     Clinical Impression Statement STGs assessed with patient attaining several.  He has met ambulation goal but STS goal partially met as he continues to brace knees on mat table.  Continued to emphasize pacing and form to focus on accuracy of task.  When cued to pace, patient more accurate ad fluid.    Personal Factors and Comorbidities Age;Comorbidity 2;Past/Current Experience;Time since onset of injury/illness/exacerbation    Comorbidities HTN, cerebellar ataxia, oropharyngeal dysphagia, Sialorrhea, Chronic R ankle pain    Examination-Activity  Limitations Carry;Lift;Squat;Stairs;Stand;Transfers    Examination-Participation Restrictions Community Activity;Laundry;Shop;Yard Work    Merchant navy officer Evolving/Moderate complexity    Rehab Potential Good    PT Frequency 2x / week    PT Duration 8 weeks    PT Treatment/Interventions Patient/family education;ADLs/Self Care Home Management;Aquatic Therapy;DME Instruction;Gait training;Stair training;Functional mobility training;Therapeutic activities;Therapeutic exercise;Balance training;Neuromuscular re-education    PT Next Visit Plan cont balance and dynamic gait activities, focus on quality and accuracy of movement vs speed to improve coordination, floor target and stepping tasks    PT Home Exercise Plan [id number](from previous episode)    Consulted and Agree with Plan of Care Patient             Patient will benefit from skilled therapeutic intervention in order to improve the following deficits and impairments:  Abnormal gait, Decreased balance, Decreased activity tolerance, Decreased endurance, Decreased coordination, Decreased mobility, Decreased range of motion, Decreased strength, Difficulty walking, Postural dysfunction, Pain  Visit Diagnosis: Ataxia  Unsteadiness on feet  Muscle weakness (generalized)     Problem List Patient Active Problem List   Diagnosis Date Noted   Persistent atrial fibrillation (  [place]) 08/28/2020   Secondary hypercoagulable state ([place]) 07/20/2020   Paroxysmal atrial fibrillation ([place]) 06/19/2020   TIA (transient ischemic attack) 11/05/2019   Essential hypertension 11/05/2019   Cerebellar ataxia ([place]) 11/05/2019   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 [place], [place], [address] Phone: [phone]   Fax:  [phone]  Name: Derrick White MRN: [id number] Date of Birth: June 02, 1940

## 2020-11-30 ENCOUNTER — Other Ambulatory Visit: Payer: Self-pay

## 2020-11-30 ENCOUNTER — Ambulatory Visit: Payer: Medicare Other

## 2020-11-30 DIAGNOSIS — R2681 Unsteadiness on feet: Secondary | ICD-10-CM

## 2020-11-30 DIAGNOSIS — R270 Ataxia, unspecified: Secondary | ICD-10-CM

## 2020-11-30 DIAGNOSIS — M25572 Pain in left ankle and joints of left foot: Secondary | ICD-10-CM | POA: Diagnosis not present

## 2020-11-30 DIAGNOSIS — M6281 Muscle weakness (generalized): Secondary | ICD-10-CM

## 2020-11-30 NOTE — Therapy (Signed)
[facility] [address] [place], [place], [address] Phone: [phone]   Fax:  [phone]  Physical Therapy Treatment  Patient Details  Name: Derrick White MRN: [id number] Date of Birth: Dec 21, 1940 Referring Provider (PT): Dorris Carnes MD   Encounter Date: 11/30/2020   PT End of Session - 11/30/20 1156     Visit Number 8    Number of Visits 17    Date for PT Re-Evaluation 12/25/20    Authorization Type Medicare/[employer] of [facility]- 10th visit progress notes due, Re-cert 05/21/22    Authorization - Number of Visits 16    Progress Note Due on Visit 10    PT Start Time 1100    PT Stop Time 1145    PT Time Calculation (min) 45 min    Equipment Utilized During Treatment Gait belt    Activity Tolerance Patient tolerated treatment well    Behavior During Therapy [facility] for tasks assessed/performed             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   Cerebellar ataxia ([place])    Dysesthesia 06/04/2016   Essential hypertension 11/05/2019   Foot pain, 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   Gait disturbance 03/16/2019   History of cervical spinal surgery 06/04/2016   History of hiatal hernia    History of kidney stones    Hypertension    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 N/A 09/13/2020   Procedure: CARDIOVERSION;  Surgeon: Sanda Klein, MD;  Location: [place];  Service: Cardiovascular;  Laterality: N/A;   FRACTURE SURGERY     Left  ankle, sx x4   INGUINAL HERNIA REPAIR Right 03/05/2019   Procedure: OPEN RIGHT INGUINAL HERNIA REPAIR WITH MESH;  Surgeon: Clovis Riley, MD;  Location: [place];  Service: General;  Laterality: Right;   [place] N/A 07/27/2020   Procedure: LEFT ATRIAL APPENDAGE OCCLUSION;  Surgeon: Sherren Mocha, MD;  Location: [place] CV LAB;  Service: Cardiovascular;  Laterality: N/A;   SHOULDER SURGERY Left    TEE WITHOUT CARDIOVERSION N/A 07/27/2020   Procedure: TRANSESOPHAGEAL ECHOCARDIOGRAM (TEE);  Surgeon: Sherren Mocha, MD;  Location: [place] CV LAB;  Service: Cardiovascular;  Laterality: N/A;   TEE WITHOUT CARDIOVERSION N/A 09/13/2020   Procedure: TRANSESOPHAGEAL ECHOCARDIOGRAM (TEE);  Surgeon: Sanda Klein, MD;  Location: [facility] ENDOSCOPY;  Service: Cardiovascular;  Laterality: N/A;   TONSILLECTOMY     1945    There were no vitals filed for this visit.   Subjective Assessment - 11/30/20 1105     Subjective Feels he is able to ambulate further and endurance is better.  Still notes good days and bad days.    Pertinent History HTN, cerebellar ataxia, oropharyngeal dysphagia, Sialorrhea, Chronic L ankle pain    Limitations Walking;Other (comment)    How long can you sit comfortably? no issues    How long can you stand comfortably? no issues  How long can you walk comfortably? 1/4 mile    Diagnostic tests 11/05/19: IMPRESSION:1. 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 Moderate chronic small vessel ischemic disease.3. Motion degraded head MRA without evidence of a large or mediumvessel occlusion or significant proximal stenosis.4. Negative neck MRA.  IMPRESSION:1. 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 Mild spinal stenosis and mild-to-moderate neural foraminalstenosis at C6-7, stable to minimally progressed.    Patient Stated  Goals walk 1 mile comfortably, negotiate stairs better    Pain Onset More than a month ago                [facility] PT Assessment - 11/30/20 0001       Ambulation/Gait   Ambulation/Gait Yes    Ambulation/Gait Assistance 5: Supervision;4: Min guard    Ambulation Distance (Feet) 200 Feet    Assistive device None    Gait Pattern Ataxic    Ambulation Surface Level;Indoor    Gait velocity 0.44ms                           [facility] Adult PT Treatment/Exercise - 11/30/20 0001       Transfers   Transfers Sit to Stand    Sit to Stand 5: Supervision    Sit to Stand Details (indicate cue type and reason) no UE assist    Five time sit to stand comments  11.78s      Knee/Hip Exercises: Aerobic   Other Aerobic Scifit 8' L2.5 arms 8 targeting 80 steps per minute      Knee/Hip Exercises: Seated   Long Arc Quad Strengthening;Both;1 set;15 reps;Weights;Limitations    Long Arc Quad Weight 2 lbs.    Long ACS[employer] CorporationLimitations performed with latissimus press, alternationg    Marching Strengthening;Both;1 set;15 reps;Weights;Limitations    Marching Limitations with latissimus press, alternating    Marching Weights 2 lbs.                 Balance Exercises - 11/30/20 0001       Balance Exercises: Standing   Rockerboard Anterior/posterior;EO;Limitations    Rockerboard Limitations 2 min hold w/o UE support rocking motion    Other Standing Exercises Comments pebble taps to 2 targets unilateral, progressing to alternating taps, 10 reps, cross body tasks then decreasing to single target using cross body tasks. Improved accuracy and no UE support needed                 PT Short Term Goals - 11/30/20 1120       PT SHORT TERM GOAL #1   Title Patient to demo initial HEP back to PT    Baseline TBD; 11/13/20 Initial HEP established    Time 4    Period Weeks    Status Achieved    Target Date 11/27/20      PT SHORT TERM GOAL #2   Title Patient will be able to  ambulate 50101facross outdoor surface with LRAD under S    Baseline 115 ft with cane and SBA; 11/13/20 100080fmbulation with SPC and CGA across paved, sloped and grassy surfaces    Time 4    Period Weeks    Status Partially Met    Target Date 11/27/20      PT SHORT TERM GOAL #3   Title Patient to negotiate steps with single UE and cane with reciprocal pattern  Baseline B UEs on rail w/o cane 4 steps; 11/27/20 Able to negotiate 16 steps with alt pattern using SPC and single rail    Time 4    Period Weeks    Status Achieved    Target Date 11/27/20      PT SHORT TERM GOAL #4   Title Patient to perform 5x STS in <11s w/o UE support w/o bracing knees against seat    Baseline 5x STS in 12.78s w/o UE assist while bracing knees against seat; 11/27/20 Able to complete in 9.4s but braced knees at times 11/30/20 5x STS 11/.78s following Scifit    Time 4    Period Weeks    Status Partially Met    Target Date 11/27/20               PT Long Term Goals - 11/30/20 1124       PT LONG TERM GOAL #1   Title Patient to perform TUG test in <13s with and w/o AD. (all LTGs due 12/25/20)    Baseline Best Tug time 16.0s using cane    Time 8    Period Weeks    Status On-going      PT LONG TERM GOAL #2   Title Patient to ambulate 1012f with LRAD across outdoor surfaces    Baseline 1166fwith SPC and SBA indoor surfaces    Time 8    Period Weeks    Status On-going      PT LONG TERM GOAL #3   Title Patient to amb[facility].8 m[facility]  Baseline Initial gait velocity 0.48 m/s, 11/30/20 Gait velocity 0.5459m   Time 8    Period Weeks    Status On-going      PT LONG TERM GOAL #4   Title Assess progress towards BERG; 11/13/20 BERG goal is 52    Baseline 11/02/20: baseline score of 50/56    Time 8    Period Weeks    Status New      PT LONG TERM GOAL #5   Title Assess progress towards FGA/DGI; 11/13/20 FGA goal is 20    Baseline 11/02/20: baseline score of 11/30    Time 8    Period Weeks     Status New                   Plan - 11/30/20 1156     Clinical Impression Statement todays session consisted of balance tasks in standing with focus on stepping and accuracy of foot placement, patient able to demo improved gait velocity and STS scores.  He was able to maintain A/P rocking on rocker board 2 min w/o UE support and demo improved accuracy w/o need of UE support.  Emphasized need to pause when he feels he is losing balance or becomes uncoordinated.    Personal Factors and Comorbidities Age;Comorbidity 2;Past/Current Experience;Time since onset of injury/illness/exacerbation    Comorbidities HTN, cerebellar ataxia, oropharyngeal dysphagia, Sialorrhea, Chronic R ankle pain    Examination-Activity Limitations Carry;Lift;Squat;Stairs;Stand;Transfers    Examination-Participation Restrictions Community Activity;Laundry;Shop;Yard Work    StaMerchant navy officerolving/Moderate complexity    Rehab Potential Good    PT Frequency 2x / week    PT Duration 8 weeks    PT Treatment/Interventions Patient/family education;ADLs/Self Care Home Management;Aquatic Therapy;DME Instruction;Gait training;Stair training;Functional mobility training;Therapeutic activities;Therapeutic exercise;Balance training;Neuromuscular re-education    PT Next Visit Plan cont balance and dynamic gait activities, focus on quality and accuracy of movement vs  speed to improve coordination, floor target and stepping tasks, cue for activity pause to control foot placement    PT Home Exercise Plan [id number](from previous episode)    Consulted and Agree with Plan of Care Patient             Patient will benefit from skilled therapeutic intervention in order to improve the following deficits and impairments:  Abnormal gait, Decreased balance, Decreased activity tolerance, Decreased endurance, Decreased coordination, Decreased mobility, Decreased range of motion, Decreased strength, Difficulty walking,  Postural dysfunction, Pain  Visit Diagnosis: Ataxia  Unsteadiness on feet  Muscle weakness (generalized)     Problem List Patient Active Problem List   Diagnosis Date Noted   Persistent atrial fibrillation ([place]) 08/28/2020   Secondary hypercoagulable state ([place]) 07/20/2020   Paroxysmal atrial fibrillation ([place]) 06/19/2020   TIA (transient ischemic attack) 11/05/2019   Essential hypertension 11/05/2019   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 [place], [place], [address] Phone: [phone]   Fax:  [phone]  Name: Derrick White MRN: [id number] Date of Birth: [birth year]

## 2020-12-04 ENCOUNTER — Other Ambulatory Visit: Payer: Self-pay

## 2020-12-04 ENCOUNTER — Ambulatory Visit: Payer: Medicare Other

## 2020-12-04 DIAGNOSIS — R270 Ataxia, unspecified: Secondary | ICD-10-CM

## 2020-12-04 DIAGNOSIS — M25572 Pain in left ankle and joints of left foot: Secondary | ICD-10-CM | POA: Diagnosis not present

## 2020-12-04 DIAGNOSIS — R2681 Unsteadiness on feet: Secondary | ICD-10-CM

## 2020-12-04 DIAGNOSIS — M6281 Muscle weakness (generalized): Secondary | ICD-10-CM | POA: Diagnosis not present

## 2020-12-04 NOTE — Therapy (Signed)
[facility] [address] [place], [place], [address] Phone: [phone]   Fax:  [phone]  Physical Therapy Treatment  Patient Details  Name: Derrick White MRN: [id number] Date of Birth: 06-25-1940 Referring Provider (PT): Dorris Carnes MD   Encounter Date: 12/04/2020   PT End of Session - 12/04/20 1104     Visit Number 9    Number of Visits 17    Date for PT Re-Evaluation 12/25/20    Authorization Type Medicare/Mutal of [facility]- 10th visit progress notes due, Re-cert 07/23/43    Authorization - Number of Visits 16    Progress Note Due on Visit 10    PT Start Time 1100    PT Stop Time 1145    PT Time Calculation (min) 45 min    Equipment Utilized During Treatment Gait belt    Activity Tolerance Patient tolerated treatment well    Behavior During Therapy [facility] for tasks assessed/performed             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   Cerebellar ataxia ([place])    Dysesthesia 06/04/2016   Essential hypertension 11/05/2019   Foot pain, 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   Gait disturbance 03/16/2019   History of cervical spinal surgery 06/04/2016   History of hiatal hernia    History of kidney stones    Hypertension    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   Memory loss 03/16/2019   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   Pain due to onychomycosis of toenails of both feet 04/28/2019   Pain in left ankle and joints of left foot 08/06/2018   Slurred speech 03/16/2019   Superficial peroneal nerve neuropathy, 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   TIA (transient ischemic attack) 11/05/2019   Worsened handwriting 03/16/2019    Past Surgical History:  Procedure Laterality Date   [place] N/A 09/13/2020   Procedure: CARDIOVERSION;  Surgeon: Sanda Klein, MD;  Location: [place];  Service: Cardiovascular;  Laterality: N/A;   FRACTURE SURGERY     Left  ankle, sx x4   INGUINAL HERNIA REPAIR Right 03/05/2019   Procedure: OPEN RIGHT INGUINAL HERNIA REPAIR WITH MESH;  Surgeon: Clovis Riley, MD;  Location: [place];  Service: General;  Laterality: Right;   [place] N/A 07/27/2020   Procedure: LEFT ATRIAL APPENDAGE OCCLUSION;  Surgeon: Sherren Mocha, MD;  Location: [place] CV LAB;  Service: Cardiovascular;  Laterality: N/A;   SHOULDER SURGERY Left    TEE WITHOUT CARDIOVERSION N/A 07/27/2020   Procedure: TRANSESOPHAGEAL ECHOCARDIOGRAM (TEE);  Surgeon: Sherren Mocha, MD;  Location: [place] CV LAB;  Service: Cardiovascular;  Laterality: N/A;   TEE WITHOUT CARDIOVERSION N/A 09/13/2020   Procedure: TRANSESOPHAGEAL ECHOCARDIOGRAM (TEE);  Surgeon: Sanda Klein, MD;  Location: [facility] ENDOSCOPY;  Service: Cardiovascular;  Laterality: N/A;   TONSILLECTOMY     1945    There were no vitals filed for this visit.   Subjective Assessment - 12/04/20 1103     Subjective No changes to note, was in Afib 7/15 for 2 days                               [facility] Adult PT Treatment/Exercise - 12/04/20 0001       Transfers   Transfers Sit to Stand  Sit to Stand Details (indicate cue type and reason) no UE assist    Number of Reps 10 reps    Comments added OH reach once standing      Ambulation/Gait   Number of Stairs 16    Gait Comments 16 steps with B rail and step through pattern      Knee/Hip Exercises: Stretches   Passive Hamstring Stretch Both;1 rep;Limitations    Passive Hamstring Stretch Limitations 2 min staic hold prior to FAQ exercises      Knee/Hip Exercises: Aerobic   Other Aerobic Scifit 8' L3 arms 8 targeting 80 steps per minute      Knee/Hip Exercises: Standing   Hip Flexion Both;1 set;15 reps;Limitations    Hip Flexion Limitations marching, alt, 2# with UE support      Knee/Hip Exercises: Seated   Long Arc Quad Strengthening;Both;1 set;15  reps;Weights;Limitations    Long Arc Quad Weight 2 lbs.    Long Arc Quad Limitations performed with latissimus press, alternationg                 Balance Exercises - 12/04/20 0001       Balance Exercises: Standing   Rockerboard Anterior/posterior;EO;Limitations    Rockerboard Limitations 2 min hold w/o UE support rocking motion    Other Standing Exercises Comments Pebble taps to 2 targets unilateral, progressing to alternating taps, 10 reps, cross body tasks then decreasing to single target using cross body tasks. 2 LOB episodes noted, no UE support allowed                 PT Short Term Goals - 11/30/20 1120       PT SHORT TERM GOAL #1   Title Patient to demo initial HEP back to PT    Baseline TBD; 11/13/20 Initial HEP established    Time 4    Period Weeks    Status Achieved    Target Date 11/27/20      PT SHORT TERM GOAL #2   Title Patient will be able to ambulate 549f across outdoor surface with LRAD under S    Baseline 115 ft with cane and SBA; 11/13/20 10060fambulation with SPC and CGA across paved, sloped and grassy surfaces    Time 4    Period Weeks    Status Partially Met    Target Date 11/27/20      PT SHORT TERM GOAL #3   Title Patient to negotiate steps with single UE and cane with reciprocal pattern    Baseline B UEs on rail w/o cane 4 steps; 11/27/20 Able to negotiate 16 steps with alt pattern using SPC and single rail    Time 4    Period Weeks    Status Achieved    Target Date 11/27/20      PT SHORT TERM GOAL #4   Title Patient to perform 5x STS in <11s w/o UE support w/o bracing knees against seat    Baseline 5x STS in 12.78s w/o UE assist while bracing knees against seat; 11/27/20 Able to complete in 9.4s but braced knees at times 11/30/20 5x STS 11/.78s following Scifit    Time 4    Period Weeks    Status Partially Met    Target Date 11/27/20               PT Long Term Goals - 11/30/20 1124       PT LONG TERM GOAL #1   Title  Patient to perform TUG  test in <13s with and w/o AD. (all LTGs due 12/25/20)    Baseline Best Tug time 16.0s using cane    Time 8    Period Weeks    Status On-going      PT LONG TERM GOAL #2   Title Patient to ambulate 1059f with LRAD across outdoor surfaces    Baseline 1187fwith SPC and SBA indoor surfaces    Time 8    Period Weeks    Status On-going      PT LONG TERM GOAL #3   Title [facility]  Baseline Initial gait velocity 0.48 m/s, 11/30/20 Gait velocity 0.5424m   Time 8    Period Weeks    Status On-going      PT LONG TERM GOAL #4   Title Assess progress towards BERG; 11/13/20 BERG goal is 52    Baseline 11/02/20: baseline score of 50/56    Time 8    Period Weeks    Status New      PT LONG TERM GOAL #5   Title Assess progress towards FGA/DGI; 11/13/20 FGA goal is 20    Baseline 11/02/20: baseline score of 11/30    Time 8    Period Weeks    Status New                   Plan - 12/04/20 1154     Clinical Impression Statement Todays session continued to focus on foot placement strategies for coordination and decreased fall risk.  Patient able to negotoate a full fight of steps with B rail support and step through pattern, perform 10 STS transfers w/o knee bracing against table and complete floor target task w/o UE support with only 2 LOB episodes.  Noted to perform tasks slower today w/o need of VCs to pace himself.    Personal Factors and Comorbidities Age;Comorbidity 2;Past/Current Experience;Time since onset of injury/illness/exacerbation    Comorbidities HTN, cerebellar ataxia, oropharyngeal dysphagia, Sialorrhea, Chronic R ankle pain    Examination-Activity Limitations Carry;Lift;Squat;Stairs;Stand;Transfers    Examination-Participation Restrictions Community Activity;Laundry;Shop;Yard Work    StaMerchant navy officerolving/Moderate complexity    Rehab Potential Good    PT Frequency 2x / week    PT Duration 8 weeks     PT Treatment/Interventions Patient/family education;ADLs/Self Care Home Management;Aquatic Therapy;DME Instruction;Gait training;Stair training;Functional mobility training;Therapeutic activities;Therapeutic exercise;Balance training;Neuromuscular re-education    PT Next Visit Plan cont balance and dynamic gait activities, focus on quality and accuracy of movement vs speed to improve coordination, floor target and stepping tasks, cue for activity pause to control foot placement, monitor accuracy, floor ladder    PT Home Exercise Plan [id number](from previous episode)    Consulted and Agree with Plan of Care Patient             Patient will benefit from skilled therapeutic intervention in order to improve the following deficits and impairments:  Abnormal gait, Decreased balance, Decreased activity tolerance, Decreased endurance, Decreased coordination, Decreased mobility, Decreased range of motion, Decreased strength, Difficulty walking, Postural dysfunction, Pain  Visit Diagnosis: Ataxia  Unsteadiness on feet  Muscle weakness (generalized)     Problem List Patient Active Problem List   Diagnosis Date Noted   Persistent atrial fibrillation ([place]CC[place]4/03/2021   Secondary hypercoagulable state (HCCC[place]3/07/2020   Paroxysmal atrial fibrillation ([place]CC[place]1/31/2022   TIA (transient ischemic attack) 11/05/2019   Essential hypertension 11/05/2019   Cerebellar ataxia ([place]CCHoyt Lakes6/18/2021   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 05/04/2019   C6 radiculopathy 05/04/2019   Pain due to onychomycosis of toenails of both feet 04/28/2019   Worsened handwriting 03/16/2019   Gait disturbance 03/16/2019   Slurred speech 03/16/2019   Memory loss 03/16/2019   Pain in left ankle and joints of left foot 08/06/2018   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   Foot pain, 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   Dysesthesia 06/04/2016   Superficial peroneal nerve neuropathy, 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 [place], [place], [address] Phone: [phone]   Fax:  [phone]  Name: Derrick White MRN: [id number] Date of Birth: 19-Jun-1940

## 2020-12-07 ENCOUNTER — Ambulatory Visit: Payer: Medicare Other

## 2020-12-07 ENCOUNTER — Other Ambulatory Visit: Payer: Self-pay

## 2020-12-07 DIAGNOSIS — R2681 Unsteadiness on feet: Secondary | ICD-10-CM

## 2020-12-07 DIAGNOSIS — M6281 Muscle weakness (generalized): Secondary | ICD-10-CM | POA: Diagnosis not present

## 2020-12-07 DIAGNOSIS — R270 Ataxia, unspecified: Secondary | ICD-10-CM | POA: Diagnosis not present

## 2020-12-07 DIAGNOSIS — M25572 Pain in left ankle and joints of left foot: Secondary | ICD-10-CM | POA: Diagnosis not present

## 2020-12-07 NOTE — Therapy (Signed)
[facility] [address] [place], [place], [address] Phone: [phone]   Fax:  [phone]  Physical Therapy 10 visit progress note  Patient Details  Name: Derrick White MRN: [id number] Date of Birth: [birth year] Referring Provider (PT): Dorris Carnes MD   Encounter Date: 12/07/2020   PT End of Session - 12/07/20 1107     Visit Number 10    Number of Visits 17    Date for PT Re-Evaluation 12/25/20    Authorization Type Medicare/Mut[employer] of [facility]- 10th visit progress notes done on 7/42/59, Re-cert 09/22/36;    Authorization - Number of Visits 16    Progress Note Due on Visit 17    PT Start Time 1100    PT Stop Time 1145    PT Time Calculation (min) 45 min    Equipment Utilized During Treatment Gait belt    Activity Tolerance Patient tolerated treatment well    Behavior During Therapy WFL for tasks assessed/performed             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   Cerebellar ataxia ([place])    Dysesthesia 06/04/2016   Essential hypertension 11/05/2019   Foot pain, 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   Gait disturbance 03/16/2019   History of cervical spinal surgery 06/04/2016   History of hiatal hernia    History of kidney stones    Hypertension    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   Memory loss 03/16/2019   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   Pain due to onychomycosis of toenails of both feet 04/28/2019   Pain in left ankle and joints of left foot 08/06/2018   Slurred speech 03/16/2019   Superficial peroneal nerve neuropathy, 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   TIA (transient ischemic attack) 11/05/2019   Worsened handwriting 03/16/2019    Past Surgical History:  Procedure Laterality Date   [place] N/A 09/13/2020   Procedure: CARDIOVERSION;  Surgeon: Sanda Klein, MD;  Location: [facility];  Service: Cardiovascular;  Laterality: N/A;    FRACTURE SURGERY     Left ankle, sx x4   INGUINAL HERNIA REPAIR Right 03/05/2019   Procedure: OPEN RIGHT INGUINAL HERNIA REPAIR WITH MESH;  Surgeon: Clovis Riley, MD;  Location: [facility];  Service: General;  Laterality: Right;   [place] N/A 07/27/2020   Procedure: LEFT ATRIAL APPENDAGE OCCLUSION;  Surgeon: Sherren Mocha, MD;  Location: [place] [facility];  Service: Cardiovascular;  Laterality: N/A;   SHOULDER SURGERY Left    TEE WITHOUT CARDIOVERSION N/A 07/27/2020   Procedure: TRANSESOPHAGEAL ECHOCARDIOGRAM (TEE);  Surgeon: Sherren Mocha, MD;  Location: [place];  Service: Cardiovascular;  Laterality: N/A;   TEE WITHOUT CARDIOVERSION N/A 09/13/2020   Procedure: TRANSESOPHAGEAL ECHOCARDIOGRAM (TEE);  Surgeon: Sanda Klein, MD;  Location: [facility] END[facility];  Service: Cardiovascular;  Laterality: N/A;   TONSILLECTOMY     1945    There were no vitals filed for this visit.   Subjective Assessment - 12/07/20 1136     Subjective Denies fall but reports of near falls where he catches himself.               Goals assesed today Weight shifts Standing with wide BOS: floor: fwd and lateral: 15x each; on foam: fwd and latera; ;15x each Gait training: st. Cane: 555 feet with SBA, 1 x  220 feet with bil walking sticks, 1 x 80 feet with single walking stick, pt educated on potentially try to use one walking stick to reduce leateral and fwd lean with cane.                           PT Short Term Goals - 12/07/20 1107       PT SHORT TERM GOAL #1   Title Patient to demo initial HEP back to PT    Baseline TBD; 11/13/20 Initial HEP established, pt reports    Time 4    Period Weeks    Status Achieved    Target Date 11/27/20      PT SHORT TERM GOAL #2   Title Patient will be able to ambulate 543ft across outdoor surface with LRAD under S    Baseline 115 ft with cane and SBA; 11/13/20 109ft ambulation with SPC  and CGA across paved, sloped and grassy surfaces; 555 ft with st. cane and SBA on level ground    Time 4    Period Weeks    Status Achieved    Target Date 11/27/20      PT SHORT TERM GOAL #3   Title Patient to negotiate steps with single UE and cane with reciprocal pattern    Baseline B UEs on rail w/o cane 4 steps; 11/27/20 Able to negotiate 16 steps with alt pattern using SPC and single rail    Time 4    Period Weeks    Status Achieved    Target Date 11/27/20      PT SHORT TERM GOAL #4   Title Patient to perform 5x STS in <11s w/o UE support w/o bracing knees against seat    Baseline 5x STS in 12.78s w/o UE assist while bracing knees against seat; 11/27/20 Able to complete in 9.4s but braced knees at times 11/30/20 5x STS 11/.78s following Scifit; 15 sec without bracking knees and without UE (12/07/20)    Time 4    Period Weeks    Status Not Met    Target Date 11/27/20               PT Long Term Goals - 12/07/20 1128       PT LONG TERM GOAL #1   Title Patient to perform TUG test in <13s with and w/o AD. (all LTGs due 12/25/20)    Baseline Best Tug time 16.0s using cane; 16 sec no AD (12/07/20)    Time 8    Period Weeks    Status On-going      PT LONG TERM GOAL #2   Title Patient to ambulate 1065ft with LRAD across outdoor surfaces    Baseline 120ft with SPC and SBA indoor surfaces; 555 feet with st. cane and SBA on level ground (12/07/20)    Time 8    Period Weeks    Status On-going      PT LONG TERM GOAL #3   Title Patient to ambulate at 0.8 m/s with [facility]    Baseline Initial gait velocity 0.48 m/s, 11/30/20 Gait velocity 0.38m/s; 0.38m/s (12/07/20)    Time 8    Period Weeks    Status On-going      PT LONG TERM GOAL #4   Title Assess progress towards BERG; 11/13/20 BERG goal is 52    Baseline 11/02/20: baseline score of 50/56    Time 8    Period Weeks    Status  On-going      PT LONG TERM GOAL #5   Title Assess progress towards FGA/DGI; 11/13/20 FGA goal is 20     Baseline 11/02/20: baseline score of 11/30    Time 8    Period Weeks    Status New                   Plan - 12/07/20 1127     Clinical Impression Statement Pt has been seen for total of 10 visits for 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 Patient has made a good progress towards his short term goals and has met 3/4 short term goals. Patient is making gradual progress towards long term goals and will benefit from continued PT    Personal Factors and Comorbidities Age;Comorbidity 2;Past/Current Experience;Time since onset of injury/illness/exacerbation    Comorbidities HTN, cerebellar ataxia, oropharyngeal dysphagia, Sialorrhea, Chronic R ankle pain    Examination-Activity Limitations Carry;Lift;Squat;Stairs;Stand;Transfers    Examination-Participation Restrictions Community Activity;Laundry;Shop;Yard Work    Merchant navy officer Evolving/Moderate complexity    Rehab Potential Good    PT Frequency 2x / week    PT Duration 8 weeks    PT Treatment/Interventions Patient/family education;ADLs/Self Care Home Management;Aquatic Therapy;DME Instruction;Gait training;Stair training;Functional mobility training;Therapeutic activities;Therapeutic exercise;Balance training;Neuromuscular re-education    PT Next Visit Plan cont balance and dynamic gait activities, focus on quality and accuracy of movement vs speed to improve coordination, floor target and stepping tasks, cue for activity pause to control foot placement, monitor accuracy, floor ladder    PT Home Exercise Plan [id number](from previous episode)    Consulted and Agree with Plan of Care Patient             Patient will benefit from skilled therapeutic intervention in order to improve the following deficits and impairments:  Abnormal gait, Decreased balance, Decreased activity tolerance, Decreased endurance, Decreased coordination, Decreased mobility, Decreased range of motion, Decreased  strength, Difficulty walking, Postural dysfunction, Pain  Visit Diagnosis: Ataxia  Unsteadiness on feet  Muscle weakness (generalized)  Pain in left ankle and joints of left foot     Problem List Patient Active Problem List   Diagnosis Date Noted   Persistent atrial fibrillation ([place]) 08/28/2020   Secondary hypercoagulable state ([place]) 07/20/2020   Paroxysmal atrial fibrillation ([place]) 06/19/2020   TIA (transient ischemic attack) 11/05/2019   Essential hypertension 11/05/2019   Cerebellar ataxia ([employer]) 11/05/2019   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   C6 radiculopathy 05/04/2019   Pain due to onychomycosis of toenails of both feet 04/28/2019   Worsened handwriting 03/16/2019   Gait disturbance 03/16/2019   Slurred speech 03/16/2019   Memory loss 03/16/2019   Pain in left ankle and joints of left foot 08/06/2018   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   Foot pain, 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   Dysesthesia 06/04/2016   Superficial peroneal nerve neuropathy, 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

## 2020-12-11 ENCOUNTER — Ambulatory Visit: Payer: Medicare Other

## 2020-12-11 ENCOUNTER — Other Ambulatory Visit: Payer: Self-pay

## 2020-12-11 DIAGNOSIS — R2681 Unsteadiness on feet: Secondary | ICD-10-CM

## 2020-12-11 DIAGNOSIS — M6281 Muscle weakness (generalized): Secondary | ICD-10-CM

## 2020-12-11 DIAGNOSIS — R270 Ataxia, unspecified: Secondary | ICD-10-CM

## 2020-12-11 DIAGNOSIS — M25572 Pain in left ankle and joints of left foot: Secondary | ICD-10-CM | POA: Diagnosis not present

## 2020-12-11 NOTE — Therapy (Signed)
Derrick White [address] [place], [place], [address] Phone: [phone]   Fax:  [phone]  Physical Therapy Treatment  Patient Details  Name: Derrick White MRN: [id number] Date of Birth: 1941-05-15 Referring Provider (PT): Dorris Carnes MD   Encounter Date: 12/11/2020   PT End of Session - 12/11/20 1014     Visit Number 11    Number of Visits 17    Date for PT Re-Evaluation 12/25/20    Authorization Type Medicare/Mutal of [facility]- 10th visit progress notes done on 08/15/49, Re-cert 12/26/39;    Authorization - Number of Visits 16    Progress Note Due on Visit 17    PT Start Time 6606    PT Stop Time 1100    PT Time Calculation (min) 45 min    Equipment Utilized During Treatment Gait belt    Activity Tolerance Patient tolerated treatment well    Behavior During Therapy WFL for tasks assessed/performed             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   Cerebellar ataxia ([place])    Dysesthesia 06/04/2016   Essential hypertension 11/05/2019   Foot pain, 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   Gait disturbance 03/16/2019   History of cervical spinal surgery 06/04/2016   History of hiatal hernia    History of kidney stones    Hypertension    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   Memory loss 03/16/2019   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   Pain due to onychomycosis of toenails of both feet 04/28/2019   Pain in left ankle and joints of left foot 08/06/2018   Slurred speech 03/16/2019   Superficial peroneal nerve neuropathy, 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   TIA (transient ischemic attack) 11/05/2019   Worsened handwriting 03/16/2019    Past Surgical History:  Procedure Laterality Date   [place] N/A 09/13/2020   Procedure: CARDIOVERSION;  Surgeon: Sanda Klein, MD;  Location: [facility];  Service: Cardiovascular;  Laterality: N/A;   FRACTURE SURGERY      Left ankle, sx x4   INGUINAL HERNIA REPAIR Right 03/05/2019   Procedure: OPEN RIGHT INGUINAL HERNIA REPAIR WITH MESH;  Surgeon: Clovis Riley, MD;  Location: [facility];  Service: General;  Laterality: Right;   [place] N/A 07/27/2020   Procedure: LEFT ATRIAL APPENDAGE OCCLUSION;  Surgeon: Sherren Mocha, MD;  Location: [facility];  Service: Cardiovascular;  Laterality: N/A;   SHOULDER SURGERY Left    TEE WITHOUT CARDIOVERSION N/A 07/27/2020   Procedure: TRANSESOPHAGEAL ECHOCARDIOGRAM (TEE);  Surgeon: Sherren Mocha, MD;  Location: [facility];  Service: Cardiovascular;  Laterality: N/A;   TEE WITHOUT CARDIOVERSION N/A 09/13/2020   Procedure: TRANSESOPHAGEAL ECHOCARDIOGRAM (TEE);  Surgeon: Sanda Klein, MD;  Location: [facility] ENDOSCOPY;  Service: Cardiovascular;  Laterality: N/A;   TONSILLECTOMY     1945    There were no vitals filed for this visit.   Subjective Assessment - 12/11/20 1018     Subjective Walking has improved but worsens at end of the day.    Pertinent History HTN, cerebellar ataxia, oropharyngeal dysphagia, Sialorrhea, Chronic L ankle pain    Limitations Walking;Other (comment)    How long can you sit comfortably? no issues    How long can you stand comfortably? no issues    How long can you  walk comfortably? 1/4 mile    Diagnostic tests 11/05/19: IMPRESSION:1. 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 Moderate chronic small vessel ischemic disease.3. Motion degraded head MRA without evidence of a large or mediumvessel occlusion or significant proximal stenosis.4. Negative neck MRA.  IMPRESSION:1. 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 Mild spinal stenosis and mild-to-moderate neural foraminalstenosis at C6-7, stable to minimally progressed.    Patient Stated Goals walk 1 mile comfortably,  negotiate stairs better    Pain Onset More than a month ago                               [facility] Adult PT Treatment/Exercise - 12/11/20 0001       Transfers   Transfers Sit to Stand    Sit to Stand Details (indicate cue type and reason) no UE assist    Five time sit to stand comments  11.7s    Number of Reps 2 sets    Comments 2x5      Knee/Hip Exercises: Stretches   Passive Hamstring Stretch Both;1 rep;Limitations    Passive Hamstring Stretch Limitations 2 min staic hold prior to FAQ exercises      Knee/Hip Exercises: Aerobic   Other Aerobic Scifit 8' L3.5 arms 8 targeting 80 steps per minute      Knee/Hip Exercises: Standing   Hip Flexion Both;1 set;15 reps;Limitations    Hip Flexion Limitations holding walking stick wit both hands      Knee/Hip Exercises: Seated   Long Arc Quad Strengthening;Both;1 set;15 reps;Weights;Limitations    Long Arc Quad Weight 3 lbs.    Long [employer] Limitations performed with latissimus press, alternationg    Marching Strengthening;Both;1 set;15 reps;Weights;Limitations    Marching Limitations with latissimus press, alternating    Marching Weights 3 lbs.      Ankle Exercises: Standing   Heel Raises 15 reps;Both;Limitations    Toe Raise 15 reps                 Balance Exercises - 12/11/20 0001       Balance Exercises: Standing   Other Standing Exercises Comments [place] taps to 2 targets unilateral, progressing to alternating taps, 10 reps, cross body tasks then decreasing to single target using cross body tasks. 2 LOB episodes noted, no UE support allowed.  Performed with 2# ankle weights                 PT Short Term Goals - 12/11/20 1020       PT SHORT TERM GOAL #1   Title Patient to demo initial HEP back to PT    Baseline TBD; 11/13/20 Initial HEP established, pt reports    Time 4    Period Weeks    Status Achieved    Target Date 11/27/20      PT SHORT TERM GOAL #2   Title Patient will be able  to ambulate 548f across outdoor surface with LRAD under S    Baseline 115 ft with cane and SBA; 11/13/20 10065fambulation with SPC and CGA across paved, sloped and grassy surfaces; 555 ft with st. cane and SBA on level ground    Time 4    Period Weeks    Status Achieved    Target Date 11/27/20      PT SHORT TERM GOAL #3   Title Patient to negotiate  steps with single UE and cane with reciprocal pattern    Baseline B UEs on rail w/o cane 4 steps; 11/27/20 Able to negotiate 16 steps with alt pattern using SPC and single rail    Time 4    Period Weeks    Status Achieved    Target Date 11/27/20      PT SHORT TERM GOAL #4   Title Patient to perform 5x STS in <11s w/o UE support w/o bracing knees against seat    Baseline 5x STS in 12.78s w/o UE assist while bracing knees against seat; 11/27/20 Able to complete in 9.4s but braced knees at times 11/30/20 5x STS 11/.78s following Scifit; 15 sec without bracking knees and without UE (12/07/20); 12/11/20 11.7s    Time 4    Period Weeks    Status Partially Met    Target Date 11/27/20               PT Long Term Goals - 12/07/20 1128       PT LONG TERM GOAL #1   Title Patient to perform TUG test in <13s with and w/o AD. (all LTGs due 12/25/20)    Baseline Best Tug time 16.0s using cane; 16 sec no AD (12/07/20)    Time 8    Period Weeks    Status On-going      PT LONG TERM GOAL #2   Title Patient to ambulate 1033f with LRAD across outdoor surfaces    Baseline 1146fwith SPC and SBA indoor surfaces; 555 feet with st. cane and SBA on level ground (12/07/20)    Time 8    Period Weeks    Status On-going      PT LONG TERM GOAL #3   Title Patient [facility]  Baseline Initial gait velocity 0.48 m/s, 11/30/20 Gait velocity 0.5424m 0.24m83m7/21/22)    Time 8    Period Weeks    Status On-going      PT LONG TERM GOAL #4   Title Assess progress towards BERG; 11/13/20 BERG goal is 52    Baseline 11/02/20: baseline score of  50/56    Time 8    Period Weeks    Status On-going      PT LONG TERM GOAL #5   Title Assess progress towards FGA/DGI; 11/13/20 FGA goal is 20    Baseline 11/02/20: baseline score of 11/30    Time 8    Period Weeks    Status New                   Plan - 12/11/20 1055     Clinical Impression Statement Todays session focused ob balance, alternating LE motions to mimic gait, use of ankle weights to improve accuracy of stepping tasks.  2 mild LOB episodes noted with floor target tapping.  Increased resistance on Scifit, increased weights on seated LE exercises.  Still noting difficulty wit improving accuracy of foot placement due to ataxia    Personal Factors and Comorbidities Age;Comorbidity 2;Past/Current Experience;Time since onset of injury/illness/exacerbation    Comorbidities HTN, cerebellar ataxia, oropharyngeal dysphagia, Sialorrhea, Chronic R ankle pain    Examination-Activity Limitations Carry;Lift;Squat;Stairs;Stand;Transfers    Examination-Participation Restrictions Community Activity;Laundry;Shop;Yard Work    StabMerchant navy officerlving/Moderate complexity    Rehab Potential Good    PT Frequency 2x / week    PT Duration 8 weeks    PT Treatment/Interventions Patient/family education;ADLs/Self Care Home Management;Aquatic Therapy;DME Instruction;Gait  training;Stair training;Functional mobility training;Therapeutic activities;Therapeutic exercise;Balance training;Neuromuscular re-education    PT Next Visit Plan Continue tasks to improve accuracy of stepping, balance and ataxia control    PT Home Exercise Plan [id number](from previous episode)    Consulted and Agree with Plan of Care Patient             Patient will benefit from skilled therapeutic intervention in order to improve the following deficits and impairments:  Abnormal gait, Decreased balance, Decreased activity tolerance, Decreased endurance, Decreased coordination, Decreased mobility,  Decreased range of motion, Decreased strength, Difficulty walking, Postural dysfunction, Pain  Visit Diagnosis: Ataxia  Unsteadiness on feet  Muscle weakness (generalized)     Problem List Patient Active Problem List   Diagnosis Date Noted   Persistent atrial fibrillation ([place]) 08/28/2020   Secondary hypercoagulable state ([place]) 07/20/2020   Paroxysmal atrial fibrillation ([place]) 06/19/2020   TIA (transient ischemic attack) 11/05/2019   Essential hypertension 11/05/2019   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 [place], [place], [address] Phone: [phone]   Fax:  [phone]  Name: Derrick White MRN: [id number] Date of Birth: Aug 27, 1940

## 2020-12-14 ENCOUNTER — Other Ambulatory Visit: Payer: Self-pay

## 2020-12-14 ENCOUNTER — Encounter: Payer: Self-pay | Admitting: Physical Therapy

## 2020-12-14 ENCOUNTER — Ambulatory Visit: Payer: Medicare Other | Admitting: Physical Therapy

## 2020-12-14 DIAGNOSIS — R2681 Unsteadiness on feet: Secondary | ICD-10-CM

## 2020-12-14 DIAGNOSIS — M25572 Pain in left ankle and joints of left foot: Secondary | ICD-10-CM | POA: Diagnosis not present

## 2020-12-14 DIAGNOSIS — M6281 Muscle weakness (generalized): Secondary | ICD-10-CM | POA: Diagnosis not present

## 2020-12-14 DIAGNOSIS — R270 Ataxia, unspecified: Secondary | ICD-10-CM | POA: Diagnosis not present

## 2020-12-14 NOTE — Therapy (Signed)
[facility] [address] [place], [place], [address] Phone: [phone]   Fax:  [phone]  Physical Therapy Treatment  Patient Details  Name: Derrick White MRN: [id number] Date of Birth: 05-10-1941 Referring Provider (PT): Dorris Carnes MD   Encounter Date: 12/14/2020   PT End of Session - 12/14/20 1106     Visit Number 12    Number of Visits 17    Date for PT Re-Evaluation 12/25/20    Authorization Type Medicare/Mutal of [facility]- 10th visit progress every 10th visit (last done visit 10 on 9/98/33) , Re-cert due by 12/19/48;    Authorization - Number of Visits --    Progress Note Due on Visit 20    PT Start Time 1103    PT Stop Time 1145    PT Time Calculation (min) 42 min    Equipment Utilized During Treatment Gait belt    Activity Tolerance Patient tolerated treatment well    Behavior During Therapy WFL for tasks assessed/performed             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   Cerebellar ataxia ([place])    Dysesthesia 06/04/2016   Essential hypertension 11/05/2019   Foot pain, 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   Gait disturbance 03/16/2019   History of cervical spinal surgery 06/04/2016   History of hiatal hernia    History of kidney stones    Hypertension    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   Memory loss 03/16/2019   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   Pain due to onychomycosis of toenails of both feet 04/28/2019   Pain in left ankle and joints of left foot 08/06/2018   Slurred speech 03/16/2019   Superficial peroneal nerve neuropathy, 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   TIA (transient ischemic attack) 11/05/2019   Worsened handwriting 03/16/2019    Past Surgical History:  Procedure Laterality Date   [place] N/A 09/13/2020   Procedure: CARDIOVERSION;  Surgeon: Sanda Klein, MD;  Location: [place];  Service: Cardiovascular;   Laterality: N/A;   FRACTURE SURGERY     Left ankle, sx x4   INGUINAL HERNIA REPAIR Right 03/05/2019   Procedure: OPEN RIGHT INGUINAL HERNIA REPAIR WITH MESH;  Surgeon: Clovis Riley, MD;  Location: [place];  Service: General;  Laterality: Right;   [place] N/A 07/27/2020   Procedure: LEFT ATRIAL APPENDAGE OCCLUSION;  Surgeon: Sherren Mocha, MD;  Location: [facility];  Service: Cardiovascular;  Laterality: N/A;   SHOULDER SURGERY Left    [place] WITHOUT CARDIOVERSION N/A 07/27/2020   Procedure: TRANSESOPHAGEAL ECHOCARDIOGRAM (TEE);  Surgeon: Sherren Mocha, MD;  Location: [place] [facility];  Service: Cardiovascular;  Laterality: N/A;   TE[place] WITHOUT CARDIOVERSION N/A 09/13/2020   Procedure: TRANSESOPHAGEAL ECHOCARDIOGRAM (TEE);  Surgeon: Sanda Klein, MD;  Location: [facility] ENDOSCOPY;  Service: Cardiovascular;  Laterality: N/A;   TONSILLECTOMY     1945    There were no vitals filed for this visit.   Subjective Assessment - 12/14/20 1105     Subjective No new complaints. No falls or pain to report.    Pertinent History HTN, cerebellar ataxia, oropharyngeal dysphagia, Sialorrhea, Chronic L ankle pain    Limitations Walking;Other (comment)    How long can you sit comfortably? no issues    How long can you stand comfortably? no issues  How long can you walk comfortably? 1/4 mile    Diagnostic tests 11/05/19: IMPRESSION:1. 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 Moderate chronic small vessel ischemic disease.3. Motion degraded head MRA without evidence of a large or mediumvessel occlusion or significant proximal stenosis.4. Negative neck MRA.  IMPRESSION:1. 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 Mild spinal stenosis and mild-to-moderate neural foraminalstenosis at C6-7, stable to minimally progressed.    Currently in Pain?  No/denies    Pain Score 0-No pain                    [facility] Adult PT Treatment/Exercise - 12/14/20 1108       Transfers   Transfers Sit to Stand;Stand to Sit    Sit to Stand 5: Supervision;With upper extremity assist;From bed;From chair/3-in-1    Stand to Sit 5: Supervision;With upper extremity assist;To bed;To chair/3-in-1      Ambulation/Gait   Ambulation/Gait Yes    Ambulation/Gait Assistance 5: Supervision;4: Min guard    Ambulation/Gait Assistance Details use of walking stick to enter/exit session with supervision. no device used around gym with min guard assist for safety.    Assistive device None   walking stick   Gait Pattern Step-through pattern;Ataxic    Ambulation Surface Level;Indoor      Neuro Re-ed    Neuro Re-ed Details  for strengthening/NMR: in tall kneeling on mat table with intermittent support on Kaye bench- alternating UE raises x 10 reps each side, then bil UE raises with 3# weighted ball x 10 reps with min assist/facilitaiton for upright posture/balance; Followed by mini squats with UE support, progressing to  without support for 10 reps each. min assist with cues on fomr/technique; in paralell bars- blue band resisted gait with no UE support- forward/backward walking with band pulling posterior to pt for ~5-6 laps each way, then lateral resistance for side stepping for 4-5 laps each way, min guard assist with occasional touch to bars, cues on step length/posture;                 Balance Exercises - 12/14/20 1128       Balance Exercises: Standing   Rockerboard Anterior/posterior;Lateral;Head turns;EO;EC;30 seconds;Other reps (comment);Limitations;Intermittent UE support    Rockerboard Limitations performed both ways on balance board with no UE support, occasional touch to walls/chair for balance assist with min<>mod assist from PTA. rocking the board with EO, progressing to EC. Then holding the board steady for EC 30 sec's x 3 reps.                  PT Short Term Goals - 12/11/20 1020       PT SHORT TERM GOAL #1   Title Patient to demo initial HEP back to PT    Baseline TBD; 11/13/20 Initial HEP established, pt reports    Time 4    Period Weeks    Status Achieved    Target Date 11/27/20      PT SHORT TERM GOAL #2   Title Patient will be able to ambulate 510f across outdoor surface with LRAD under S    Baseline 115 ft with cane and SBA; 11/13/20 10025fambulation with SPC and CGA across paved, sloped and grassy surfaces; 555 ft with st. cane and SBA on level ground    Time 4    Period Weeks    Status Achieved    Target Date 11/27/20  PT SHORT TERM GOAL #3   Title Patient to negotiate steps with single UE and cane with reciprocal pattern    Baseline B UEs on rail w/o cane 4 steps; 11/27/20 Able to negotiate 16 steps with alt pattern using SPC and single rail    Time 4    Period Weeks    Status Achieved    Target Date 11/27/20      PT SHORT TERM GOAL #4   Title Patient to perform 5x STS in <11s w/o UE support w/o bracing knees against seat    Baseline 5x STS in 12.78s w/o UE assist while bracing knees against seat; 11/27/20 Able to complete in 9.4s but braced knees at times 11/30/20 5x STS 11/.78s following Scifit; 15 sec without bracking knees and without UE (12/07/20); 12/11/20 11.7s    Time 4    Period Weeks    Status Partially Met    Target Date 11/27/20               PT Long Term Goals - 12/07/20 1128       PT LONG TERM GOAL #1   Title Patient to perform TUG test in <13s with and w/o AD. (all LTGs due 12/25/20)    Baseline Best Tug time 16.0s using cane; 16 sec no AD (12/07/20)    Time 8    Period Weeks    Status On-going      PT LONG TERM GOAL #2   Title Patient to ambulate 1098f with LRAD across outdoor surfaces    Baseline 1139fwith SPC and SBA indoor surfaces; 555 feet with st. cane and SBA on level ground (12/07/20)    Time 8    Period Weeks    Status On-going      PT LONG TERM GOAL #3    Title Patient to amb[facility] 0.8 m[facility]  Baseline Initial gait velocity 0.48 m/s, 11/30/20 Gait velocity 0.5473m 0.49m88m7/21/22)    Time 8    Period Weeks    Status On-going      PT LONG TERM GOAL #4   Title Assess progress towards BERG; 11/13/20 BERG goal is 52    Baseline 11/02/20: baseline score of 50/56    Time 8    Period Weeks    Status On-going      PT LONG TERM GOAL #5   Title Assess progress towards FGA/DGI; 11/13/20 FGA goal is 20    Baseline 11/02/20: baseline score of 11/30    Time 8    Period Weeks    Status New                   Plan - 12/14/20 1108     Clinical Impression Statement Today's skilled session continued to focus on strengthening and balance training. No issues noted or reported in session. The pt is progressing well toward goals and should benefit from continued PT to progress toward unmet goals.    Personal Factors and Comorbidities Age;Comorbidity 2;Past/Current Experience;Time since onset of injury/illness/exacerbation    Comorbidities HTN, cerebellar ataxia, oropharyngeal dysphagia, Sialorrhea, Chronic R ankle pain    Examination-Activity Limitations Carry;Lift;Squat;Stairs;Stand;Transfers    Examination-Participation Restrictions Community Activity;Laundry;Shop;Yard Work    StabMerchant navy officerlving/Moderate complexity    Rehab Potential Good    PT Frequency 2x / week    PT Duration 8 weeks    PT Treatment/Interventions Patient/family education;ADLs/Self Care Home Management;Aquatic Therapy;DME Instruction;Gait training;Stair training;Functional mobility training;Therapeutic activities;Therapeutic exercise;Balance training;Neuromuscular re-education  PT Next Visit Plan Continue tasks to improve accuracy of stepping, balance and ataxia control    PT Home Exercise Plan [id number](from previous episode)    Consulted and Agree with Plan of Care Patient             Patient will benefit from skilled therapeutic  intervention in order to improve the following deficits and impairments:  Abnormal gait, Decreased balance, Decreased activity tolerance, Decreased endurance, Decreased coordination, Decreased mobility, Decreased range of motion, Decreased strength, Difficulty walking, Postural dysfunction, Pain  Visit Diagnosis: Ataxia  Unsteadiness on feet  Muscle weakness (generalized)     Problem List Patient Active Problem List   Diagnosis Date Noted   Persistent atrial fibrillation ([place]) 08/28/2020   Secondary hypercoagulable state ([place]) 07/20/2020   Paroxysmal atrial fibrillation ([place]) 06/19/2020   TIA (transient ischemic attack) 11/05/2019   Essential hypertension 11/05/2019   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   C6 radiculopathy 05/04/2019   Pain due to onychomycosis of toenails of both feet 04/28/2019   Worsened handwriting 03/16/2019   Gait disturbance 03/16/2019   Slurred speech 03/16/2019   Memory loss 03/16/2019   Pain in left ankle and joints of left foot 08/06/2018   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   Foot pain, 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   Dysesthesia 06/04/2016   Superficial peroneal nerve neuropathy, 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

## 2020-12-19 ENCOUNTER — Other Ambulatory Visit: Payer: Self-pay

## 2020-12-19 ENCOUNTER — Ambulatory Visit: Payer: Medicare Other | Attending: Internal Medicine | Admitting: Physical Therapy

## 2020-12-19 ENCOUNTER — Other Ambulatory Visit: Payer: Self-pay | Admitting: Internal Medicine

## 2020-12-19 DIAGNOSIS — R270 Ataxia, unspecified: Secondary | ICD-10-CM | POA: Diagnosis not present

## 2020-12-19 DIAGNOSIS — R2681 Unsteadiness on feet: Secondary | ICD-10-CM | POA: Diagnosis not present

## 2020-12-19 DIAGNOSIS — M6281 Muscle weakness (generalized): Secondary | ICD-10-CM | POA: Diagnosis not present

## 2020-12-20 ENCOUNTER — Telehealth: Payer: Self-pay | Admitting: Nurse Practitioner

## 2020-12-20 NOTE — Telephone Encounter (Signed)
Call returned to Derrick White regarding antibiotics for upcoming dental procedure. Patient stated that the procedure was a routine cleaning and I advised that after 6 months post implant he will no longer require antibiotics for dental procedures. September 6 th will mark his 6 month date and I advised to make his appointment after this date. He agreed with that plan and thanked me for the call today.  [name], RN Watchman Team

## 2020-12-20 NOTE — Therapy (Signed)
[facility] [address] [place], [place], [address] Phone: [phone]   Fax:  [phone]  Physical Therapy Treatment  Patient Details  Name: Derrick White MRN: [id number] Date of Birth: 1940/08/04 Referring Provider (PT): Dorris Carnes MD   Encounter Date: 12/19/2020   PT End of Session - 12/20/20 0835     Visit Number 13    Number of Visits 17    Date for PT Re-Evaluation 12/25/20    Authorization Type Medicare/Mutal of [facility]- 10th visit progress every 10th visit (last done visit 10 on 3/49/17) , Re-cert due by 01/18/49;    Progress Note Due on Visit 20    PT Start Time 1102    PT Stop Time 1146    PT Time Calculation (min) 44 min    Equipment Utilized During Treatment Gait belt    Activity Tolerance Patient tolerated treatment well    Behavior During Therapy [facility] for tasks assessed/performed             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   Cerebellar ataxia ([place])    Dysesthesia 06/04/2016   Essential hypertension 11/05/2019   Foot pain, 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   Gait disturbance 03/16/2019   History of cervical spinal surgery 06/04/2016   History of hiatal hernia    History of kidney stones    Hypertension    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   Memory loss 03/16/2019   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   Pain due to onychomycosis of toenails of both feet 04/28/2019   Pain in left ankle and joints of left foot 08/06/2018   Slurred speech 03/16/2019   Superficial peroneal nerve neuropathy, 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   TIA (transient ischemic attack) 11/05/2019   Worsened handwriting 03/16/2019    Past Surgical History:  Procedure Laterality Date   [place] N/A 09/13/2020   Procedure: CARDIOVERSION;  Surgeon: Sanda Klein, MD;  Location: [place];  Service: Cardiovascular;  Laterality: N/A;   FRACTURE SURGERY      Left ankle, sx x4   INGUINAL HERNIA REPAIR Right 03/05/2019   Procedure: OPEN RIGHT INGUINAL HERNIA REPAIR WITH MESH;  Surgeon: Clovis Riley, MD;  Location: [place];  Service: General;  Laterality: Right;   [place] N/A 07/27/2020   Procedure: LEFT ATRIAL APPENDAGE OCCLUSION;  Surgeon: Sherren Mocha, MD;  Location: [facility];  Service: Cardiovascular;  Laterality: N/A;   SHOULDER SURGERY Left    TEE WITHOUT CARDIOVERSION N/A 07/27/2020   Procedure: TRANSESOPHAGEAL ECHOCARDIOGRAM (TEE);  Surgeon: Sherren Mocha, MD;  Location: [place] [facility];  Service: Cardiovascular;  Laterality: N/A;   TEE WITHOUT CARDIOVERSION N/A 09/13/2020   Procedure: TRANSESOPHAGEAL ECHOCARDIOGRAM (TEE);  Surgeon: Sanda Klein, MD;  Location: [facility] ENDOSCOPY;  Service: Cardiovascular;  Laterality: N/A;   TONSILLECTOMY     1945    There were no vitals filed for this visit.   Subjective Assessment - 12/20/20 0835     Subjective Pt reports he is doing well - states his grandson is getting married in Sept. and he would like to continue PT into Sept. to continue to work on his balance, as he knows he will doing alot of walking at the wedding    Pertinent History HTN, cerebellar ataxia, oropharyngeal dysphagia, Sialorrhea, Chronic L ankle pain  Limitations Walking;Other (comment)    How long can you sit comfortably? no issues    How long can you stand comfortably? no issues    How long can you walk comfortably? 1/4 mile    Diagnostic tests 11/05/19: IMPRESSION:1. 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 Moderate chronic small vessel ischemic disease.3. Motion degraded head MRA without evidence of a large or mediumvessel occlusion or significant proximal stenosis.4. Negative neck MRA.  IMPRESSION:1. 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 Mild  spinal stenosis and mild-to-moderate neural foraminalstenosis at C6-7, stable to minimally progressed.    Currently in Pain? No/denies                               [facility] Adult PT Treatment/Exercise - 12/20/20 0001       Transfers   Transfers Sit to Stand;Stand to Sit    Sit to Stand 5: Supervision    Number of Reps 10 reps    Comments 5 reps feet on floor, 5 reps feet on Airex - no UE support, performed from hi/lo mat table      Ambulation/Gait   Ambulation/Gait Yes    Ambulation/Gait Assistance 5: Supervision    Ambulation/Gait Assistance Details no device used during session    Assistive device None    Gait Pattern Ataxic;Step-through pattern    Ambulation Surface Level;Indoor      Neuro Re-ed    Neuro Re-ed Details  Pt performed floor ladder activities - step over step sequence x 4 reps with CGA; stepping out/in x 4 reps to improve coordination bil. LE's                 Balance Exercises - 12/20/20 0001       Balance Exercises: Standing   Rockerboard Anterior/posterior;Head turns;EO;Other reps (comment);Intermittent UE support   2 sets 10 reps   Turning Right;Left;Other (comment)   4 reps total - 2 to each side   Step Over Hurdles / Cones stepping over black balance beam 10 reps each LE with UE support on // bars    Marching Dynamic;Static;Forwards;Foam/compliant surface   static marching on Airex 10 reps each LE with min to mod assist for recovery of LOB:  marching forward 40' - with contralateral arm lift for improved coordination and SLS on each leg   Other Standing Exercises Pt performed motor control/coordination exercise with medium sized ball with each foot - rolling ball forward/back and then laterally 10 rep each foot with UE support on stair rail    Other Standing Exercises Comments Touching 3 balance bubbles in various sequences for imrpoved coordionation and SLS on each leg; cone taps to 4 cones, progressing to tipping cone over and  standing upright with each foot with CGA to min assist for recovery of LOB; coordination ex. - touching the #4's on the 4" step with each foot - standing on floor - CGA for balance - for improved coordination                 PT Short Term Goals - 12/20/20 0854       PT SHORT TERM GOAL #1   Title Patient to demo initial HEP back to PT    Baseline TBD; 11/13/20 Initial HEP established, pt reports    Time 4    Period Weeks    Status Achieved  Target Date 11/27/20      PT SHORT TERM GOAL #2   Title Patient will be able to ambulate 553f across outdoor surface with LRAD under S    Baseline 115 ft with cane and SBA; 11/13/20 10057fambulation with SPC and CGA across paved, sloped and grassy surfaces; 555 ft with st. cane and SBA on level ground    Time 4    Period Weeks    Status Achieved    Target Date 11/27/20      PT SHORT TERM GOAL #3   Title Patient to negotiate steps with single UE and cane with reciprocal pattern    Baseline B UEs on rail w/o cane 4 steps; 11/27/20 Able to negotiate 16 steps with alt pattern using SPC and single rail    Time 4    Period Weeks    Status Achieved    Target Date 11/27/20      PT SHORT TERM GOAL #4   Title Patient to perform 5x STS in <11s w/o UE support w/o bracing knees against seat    Baseline 5x STS in 12.78s w/o UE assist while bracing knees against seat; 11/27/20 Able to complete in 9.4s but braced knees at times 11/30/20 5x STS 11/.78s following Scifit; 15 sec without bracking knees and without UE (12/07/20); 12/11/20 11.7s    Time 4    Period Weeks    Status Partially Met    Target Date 11/27/20               PT Long Term Goals - 12/19/20 1111       PT LONG TERM GOAL #1   Title Patient to perform TUG test in <13s with and w/o AD. (all LTGs due 12/25/20)    Baseline Best Tug time 16.0s using cane; 16 sec no AD (12/07/20)    Time 8    Period Weeks    Status On-going      PT LONG TERM GOAL #2   Title Patient to ambulate  100025fith LRAD across outdoor surfaces    Baseline 115f48fth SPC and SBA indoor surfaces; 555 feet with st. cane and SBA on level ground (12/07/20)    Time 8    Period Weeks    Status On-going      PT LONG TERM GOAL #3   Title Patient to ambulate at 0.8 m/s with S[facility] 0.48 m/s, 11/30/20 Gait velocity 0.48m/4m.53m/s12m21/22)    Time 8    Period Weeks    Status On-going      PT LONG TERM GOAL #4   Title Assess progress towards BERG; 11/13/20 BERG goal is 52    Baseline 11/02/20: baseline score of 50/56    Time 8    Period Weeks    Status On-going      PT LONG TERM GOAL #5   Title Assess progress towards FGA/DGI; 11/13/20 FGA goal is 20    Baseline 11/02/20: baseline score of 11/30    Time 8    Period Weeks    Status New                   Plan - 12/20/20 0851  3710linical Impression Statement PT session focused on coordination exercises and activities to reduce ataxic gait pattern and to improve motor control and coordination in bil. LE's.  Pt's RLE is slighty more ataxic than LLE.  Pt needs UE support for  safety with SLS exercises.  Cont with POC.    Personal Factors and Comorbidities Age;Comorbidity 2;Past/Current Experience;Time since onset of injury/illness/exacerbation    Comorbidities HTN, cerebellar ataxia, oropharyngeal dysphagia, Sialorrhea, Chronic R ankle pain    Examination-Activity Limitations Carry;Lift;Squat;Stairs;Stand;Transfers    Examination-Participation Restrictions Community Activity;Laundry;Shop;Yard Work    Merchant navy officer Evolving/Moderate complexity    Rehab Potential Good    PT Frequency 2x / week    PT Duration 8 weeks    PT Treatment/Interventions Patient/family education;ADLs/Self Care Home Management;Aquatic Therapy;DME Instruction;Gait training;Stair training;Functional mobility training;Therapeutic activities;Therapeutic exercise;Balance training;Neuromuscular re-education    PT Next  Visit Plan Begin checking LTG's (due 12-25-20) - pt requests to cont. PT if possible (?)  Continue tasks to improve accuracy of stepping, balance and ataxia control    PT Home Exercise Plan [id number](from previous episode)    Consulted and Agree with Plan of Care Patient             Patient will benefit from skilled therapeutic intervention in order to improve the following deficits and impairments:  Abnormal gait, Decreased balance, Decreased activity tolerance, Decreased endurance, Decreased coordination, Decreased mobility, Decreased range of motion, Decreased strength, Difficulty walking, Postural dysfunction, Pain  Visit Diagnosis: Ataxia  Unsteadiness on feet     Problem List Patient Active Problem List   Diagnosis Date Noted   Persistent atrial fibrillation ([place]) 08/28/2020   Secondary hypercoagulable state ([place]) 07/20/2020   Paroxysmal atrial fibrillation ([place]) 06/19/2020   TIA (transient ischemic attack) 11/05/2019   Essential hypertension 11/05/2019   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/05/2019   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   C6 radiculopathy 05/04/2019   Pain due to onychomycosis of toenails of both feet 04/28/2019   Worsened handwriting 03/16/2019   Gait disturbance 03/16/2019   Slurred speech 03/16/2019   Memory loss 03/16/2019   Pain in left ankle and joints of left foot 08/06/2018   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   Foot pain, 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   Dysesthesia 06/04/2016   Superficial peroneal nerve neuropathy, 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

## 2020-12-22 ENCOUNTER — Ambulatory Visit: Payer: Medicare Other

## 2020-12-22 ENCOUNTER — Other Ambulatory Visit: Payer: Self-pay

## 2020-12-22 DIAGNOSIS — R270 Ataxia, unspecified: Secondary | ICD-10-CM | POA: Diagnosis not present

## 2020-12-22 DIAGNOSIS — R2681 Unsteadiness on feet: Secondary | ICD-10-CM | POA: Diagnosis not present

## 2020-12-22 DIAGNOSIS — M6281 Muscle weakness (generalized): Secondary | ICD-10-CM | POA: Diagnosis not present

## 2020-12-22 NOTE — Therapy (Signed)
[facility] [address] [place], [place], [address] Phone: [phone]   Fax:  [phone]  Physical Therapy Treatment  Patient Details  Name: Derrick White MRN: [id number] Date of Birth: 03-May-1941 Referring Provider (PT): Dorris Carnes MD   Encounter Date: 12/22/2020   PT End of Session - 12/22/20 1103     Visit Number 14    Number of Visits 17    Date for PT Re-Evaluation 12/25/20    Authorization Type Medicare/[employer] of [facility]- 10th visit progress every 10th visit (last done visit 10 on 3/84/66) , Re-cert due by 09/26/91;    Progress Note Due on Visit 20    PT Start Time 1103    PT Stop Time 1143    PT Time Calculation (min) 40 min    Equipment Utilized During Treatment Gait belt    Activity Tolerance Patient tolerated treatment well    Behavior During Therapy [facility] for tasks assessed/performed             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   Cerebellar ataxia ([place])    Dysesthesia 06/04/2016   Essential hypertension 11/05/2019   Foot pain, 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   Gait disturbance 03/16/2019   History of cervical spinal surgery 06/04/2016   History of hiatal hernia    History of kidney stones    Hypertension    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   Memory loss 03/16/2019   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   Pain due to onychomycosis of toenails of both feet 04/28/2019   Pain in left ankle and joints of left foot 08/06/2018   Slurred speech 03/16/2019   Superficial peroneal nerve neuropathy, 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   TIA (transient ischemic attack) 11/05/2019   Worsened handwriting 03/16/2019    Past Surgical History:  Procedure Laterality Date   [place] N/A 09/13/2020   Procedure: CARDIOVERSION;  Surgeon: Sanda Klein, MD;  Location: [facility];  Service: Cardiovascular;  Laterality: N/A;   FRACTURE SURGERY      Left ankle, sx x4   INGUINAL HERNIA REPAIR Right 03/05/2019   Procedure: OPEN RIGHT INGUINAL HERNIA REPAIR WITH MESH;  Surgeon: Clovis Riley, MD;  Location: [facility];  Service: General;  Laterality: Right;   KNEE SURGERY Left 1985   LEFT ATRIAL APPENDAGE OCCLUSION N/A 07/27/2020   Procedure: LEFT ATRIAL APPENDAGE OCCLUSION;  Surgeon: Sherren Mocha, MD;  Location: [facility];  Service: Cardiovascular;  Laterality: N/A;   SHOULDER SURGERY Left    TEE WITHOUT CARDIOVERSION N/A 07/27/2020   Procedure: TRANSESOPHAGEAL ECHOCARDIOGRAM (TEE);  Surgeon: Sherren Mocha, MD;  Location: [facility];  Service: Cardiovascular;  Laterality: N/A;   TEE WITHOUT CARDIOVERSION N/A 09/13/2020   Procedure: TRANSESOPHAGEAL ECHOCARDIOGRAM (TEE);  Surgeon: Sanda Klein, MD;  Location: [facility];  Service: Cardiovascular;  Laterality: N/A;   TONSILLECTOMY     1945    There were no vitals filed for this visit.   Subjective Assessment - 12/22/20 1106     Subjective Patient reports no new changes/complaints. No falls.    Pertinent History HTN, cerebellar ataxia, oropharyngeal dysphagia, Sialorrhea, Chronic L ankle pain    Limitations Walking;Other (comment)    How long can you sit comfortably? no issues    How long can you stand comfortably? no issues    How long can you walk comfortably? 1/4 mile  Diagnostic tests 11/05/19: IMPRESSION:1. 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 Moderate chronic small vessel ischemic disease.3. Motion degraded head MRA without evidence of a large or mediumvessel occlusion or significant proximal stenosis.4. Negative neck MRA.  IMPRESSION:1. 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 Mild spinal stenosis and mild-to-moderate neural foraminalstenosis at C6-7, stable to minimally progressed.    Currently in Pain? No/denies                 [facility] PT Assessment - 12/22/20 0001       Standardized Balance Assessment   Standardized Balance Assessment Berg Balance Test      Berg Balance Test   Sit to Stand Able to stand without using hands and stabilize independently    Standing Unsupported Able to stand safely 2 minutes    Sitting with Back Unsupported but Feet Supported on Floor or Stool Able to sit safely and securely 2 minutes    Stand to Sit Sits safely with minimal use of hands    Transfers Able to transfer safely, minor use of hands    Standing Unsupported with Eyes Closed Able to stand 10 seconds safely    Standing Unsupported with Feet Together Able to place feet together independently and stand 1 minute safely    From Standing, Reach Forward with Outstretched Arm Can reach confidently >25 cm (10")    From Standing Position, Pick up Object from Floor Able to pick up shoe safely and easily    From Standing Position, Turn to Look Behind Over each Shoulder Looks behind from both sides and weight shifts well    Turn 360 Degrees Able to turn 360 degrees safely one side only in 4 seconds or less    Standing Unsupported, Alternately Place Feet on Step/Stool Able to stand independently and safely and complete 8 steps in 20 seconds   13.82 secs   Standing Unsupported, One Foot in Front Able to plae foot ahead of the other independently and hold 30 seconds    Standing on One Leg Able to lift leg independently and hold 5-10 seconds    Total Score 53    Berg comment: 53/56               [facility] Adult PT Treatment/Exercise - 12/22/20 0001       Transfers   Transfers Sit to Stand;Stand to Sit    Sit to Stand 5: Supervision    Stand to Sit 5: Supervision;With upper extremity assist;To bed;To chair/3-in-1      Ambulation/Gait   Ambulation/Gait Yes    Ambulation/Gait Assistance 5: Supervision;4: Min guard    Ambulation/Gait Assistance Details completed ambulation outdoors on unlevel surfaces with pavement and grass x 1200 ft.  Paitent able to ambulate across paved surfaces without walking stick, but conitnues to utilize walking stick on grass surface with intermittent CGA required due to balance challenge.    Ambulation Distance (Feet) 1200 Feet    Assistive device None;Other (Comment)   Walking Stick   Gait Pattern Ataxic;Step-through pattern    Ambulation Surface Level;Indoor;Unlevel;Outdoor;Paved;Grass      Neuro Re-ed    Neuro Re-ed Details  Standing with foot placed on soccerball, completed A/P rolls x 10 reps bilat, followed by lateral rolls x 10 reps. Alternating BLE. Increased challenge with SLS on RLE noted.                 Balance Exercises - 12/22/20  0001       Balance Exercises: Standing   Standing Eyes Closed Narrow base of support (BOS);Head turns;Solid surface;Limitations    Standing Eyes Closed Limitations standing narrow BOS on firm surface with eyes closed 3 x 30 seconds; as well as narrow BOs with eyes closed and addition of horizontal/vertical head turns x 10 reps each.    SLS with Vectors Solid surface;Limitations    SLS with Vectors Limitations with colored pebbles in various direction, PT calling out LE and color of pebble to tap x 20 reps, with addition of crossover. increased challenge with SLS on RLE. CGA               PT Education - 12/22/20 1146     Education Details progress toward LTGs    Person(s) Educated Patient    Methods Explanation    Comprehension Verbalized understanding              PT Short Term Goals - 12/20/20 0854       PT SHORT TERM GOAL #1   Title Patient to demo initial HEP back to PT    Baseline TBD; 11/13/20 Initial HEP established, pt reports    Time 4    Period Weeks    Status Achieved    Target Date 11/27/20      PT SHORT TERM GOAL #2   Title Patient will be able to ambulate 534ft across outdoor surface with LRAD under S    Baseline 115 ft with cane and SBA; 11/13/20 1084ft ambulation with SPC and CGA across paved, sloped and  grassy surfaces; 555 ft with st. cane and SBA on level ground    Time 4    Period Weeks    Status Achieved    Target Date 11/27/20      PT SHORT TERM GOAL #3   Title Patient to negotiate steps with single UE and cane with reciprocal pattern    Baseline B UEs on rail w/o cane 4 steps; 11/27/20 Able to negotiate 16 steps with alt pattern using SPC and single rail    Time 4    Period Weeks    Status Achieved    Target Date 11/27/20      PT SHORT TERM GOAL #4   Title Patient to perform 5x STS in <11s w/o UE support w/o bracing knees against seat    Baseline 5x STS in 12.78s w/o UE assist while bracing knees against seat; 11/27/20 Able to complete in 9.4s but braced knees at times 11/30/20 5x STS 11/.78s following Scifit; 15 sec without bracking knees and without UE (12/07/20); 12/11/20 11.7s    Time 4    Period Weeks    Status Partially Met    Target Date 11/27/20               PT Long Term Goals - 12/22/20 1131       PT LONG TERM GOAL #1   Title Patient to perform TUG test in <13s with and w/o AD. (all LTGs due 12/25/20)    Baseline Best Tug time 16.0s using cane; 16 sec no AD (12/07/20)    Time 8    Period Weeks    Status On-going      PT LONG TERM GOAL #2   Title Patient to ambulate 1042ft with LRAD across outdoor surfaces    Baseline 172ft with SPC and SBA indoor surfaces; 555 feet with st. cane and SBA on level ground (12/07/20); 1200 ft outdoors with  suprevision on paved surface, CGA and walking stick on grass    Time 8    Period Weeks    Status Achieved      PT LONG TERM GOAL #3   Title Patient to ambulate at 0.8 m/s with [facility]    Baseline Initial gait velocity 0.48 m/s, 11/30/20 Gait velocity 0.30m/s; 0.81m/s (12/07/20)    Time 8    Period Weeks    Status On-going      PT LONG TERM GOAL #4   Title Assess progress towards BERG; 11/13/20 BERG goal is 52    Baseline 11/02/20: baseline score of 50/56; 53/56    Time 8    Period Weeks    Status Achieved      PT LONG TERM  GOAL #5   Title Assess progress towards FGA/DGI; 11/13/20 FGA goal is 20    Baseline 11/02/20: baseline score of 11/30    Time 8    Period Weeks    Status New                   Plan - 12/22/20 1157     Clinical Impression Statement Started assesment of patient's progress toward STG. Patient able to meet LTG #2 and 4. Paitent able to ambulate 1200 ft outdoors with walking stick intermittent, especially on grass unlevel surfaces. Patient improved Berg Balance to 53/56. Patient is demonstrating progress with PT services but will continue to benefit to further improve balance on complaint surfaces. Patient requesting to conitnue with PT to work on balance outdoors, with plans to be in grandson outdoor wedding in Sept. Will continue to progress toward all LTGs.    Personal Factors and Comorbidities Age;Comorbidity 2;Past/Current Experience;Time since onset of injury/illness/exacerbation    Comorbidities HTN, cerebellar ataxia, oropharyngeal dysphagia, Sialorrhea, Chronic R ankle pain    Examination-Activity Limitations Carry;Lift;Squat;Stairs;Stand;Transfers    Examination-Participation Restrictions Community Activity;Laundry;Shop;Yard Work    [employer] Evolving/Moderate complexity    Rehab Potential Good    PT Frequency 2x / week    PT Duration 8 weeks    PT Treatment/Interventions Patient/family education;ADLs/Self Care Home Management;Aquatic Therapy;DME Instruction;Gait training;Stair training;Functional mobility training;Therapeutic activities;Therapeutic exercise;Balance training;Neuromuscular re-education    PT Next Visit Plan Finish Checking LTG + complete a re-cert (due 06-24-61) - pt requests to cont. PT if possible. Continue tasks to improve accuracy of stepping, balance and ataxia control    PT Home Exercise Plan [id number](from previous episode)    Consulted and Agree with Plan of Care Patient             Patient will benefit from skilled therapeutic  intervention in order to improve the following deficits and impairments:  Abnormal gait, Decreased balance, Decreased activity tolerance, Decreased endurance, Decreased coordination, Decreased mobility, Decreased range of motion, Decreased strength, Difficulty walking, Postural dysfunction, Pain  Visit Diagnosis: Unsteadiness on feet  Muscle weakness (generalized)     Problem List Patient Active Problem List   Diagnosis Date Noted   Persistent atrial fibrillation ([place]) 08/28/2020   Secondary hypercoagulable state ([place]) 07/20/2020   Paroxysmal atrial fibrillation ([place]) 06/19/2020   TIA (transient ischemic attack) 11/05/2019   Essential hypertension 11/05/2019   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 [place], [place], [address] Phone: [phone]   Fax:  [phone]  Name: Derrick White MRN: [id number] Date of Birth: 1941-04-22

## 2020-12-25 ENCOUNTER — Other Ambulatory Visit: Payer: Self-pay

## 2020-12-25 ENCOUNTER — Ambulatory Visit: Payer: Medicare Other

## 2020-12-25 DIAGNOSIS — R270 Ataxia, unspecified: Secondary | ICD-10-CM

## 2020-12-25 DIAGNOSIS — M6281 Muscle weakness (generalized): Secondary | ICD-10-CM

## 2020-12-25 DIAGNOSIS — R2681 Unsteadiness on feet: Secondary | ICD-10-CM | POA: Diagnosis not present

## 2020-12-25 NOTE — Therapy (Signed)
[facility] [address] [place], [place], [address] Phone: [phone]   Fax:  [phone]  Physical Therapy Treatment  Patient Details  Name: Derrick White MRN: [id number] Date of Birth: April 23, 1941 Referring Provider (PT): Dorris Carnes MD   Encounter Date: 12/25/2020   PT End of Session - 12/25/20 1058     Visit Number 15    Number of Visits 17    Date for PT Re-Evaluation 12/25/20    Authorization Type Medicare/Mutal of [facility]- 10th visit progress every 10th visit (last done visit 10 on 7/62/26) , Re-cert due by 07/20/33;    Authorization - Number of Visits 16    Progress Note Due on Visit 20    PT Start Time 1100    PT Stop Time 1140    PT Time Calculation (min) 40 min    Equipment Utilized During Treatment Gait belt    Activity Tolerance Patient tolerated treatment well    Behavior During Therapy WFL for tasks assessed/performed             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   Cerebellar ataxia ([place])    Dysesthesia 06/04/2016   Essential hypertension 11/05/2019   Foot pain, 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   Gait disturbance 03/16/2019   History of cervical spinal surgery 06/04/2016   History of hiatal hernia    History of kidney stones    Hypertension    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   Memory loss 03/16/2019   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   Pain due to onychomycosis of toenails of both feet 04/28/2019   Pain in left ankle and joints of left foot 08/06/2018   Slurred speech 03/16/2019   Superficial peroneal nerve neuropathy, 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   TIA (transient ischemic attack) 11/05/2019   Worsened handwriting 03/16/2019    Past Surgical History:  Procedure Laterality Date   [place] N/A 09/13/2020   Procedure: CARDIOVERSION;  Surgeon: Sanda Klein, MD;  Location: [place];  Service: Cardiovascular;   Laterality: N/A;   FRACTURE SURGERY     Left ankle, sx x4   INGUINAL HERNIA REPAIR Right 03/05/2019   Procedure: OPEN RIGHT INGUINAL HERNIA REPAIR WITH MESH;  Surgeon: Clovis Riley, MD;  Location: [place];  Service: General;  Laterality: Right;   [place] N/A 07/27/2020   Procedure: LEFT ATRIAL APPENDAGE OCCLUSION;  Surgeon: Sherren Mocha, MD;  Location: [place] [facility];  Service: Cardiovascular;  Laterality: N/A;   SHOULDER SURGERY Left    [place] WITHOUT CARDIOVERSION N/A 07/27/2020   Procedure: TRANSESOPHAGEAL ECHOCARDIOGRAM (TEE);  Surgeon: Sherren Mocha, MD;  Location: [place] [facility];  Service: Cardiovascular;  Laterality: N/A;   [place] WITHOUT CARDIOVERSION N/A 09/13/2020   Procedure: TRANSESOPHAGEAL ECHOCARDIOGRAM (TEE);  Surgeon: Sanda Klein, MD;  [facility];  Service: Cardiovascular;  Laterality: N/A;   TONSILLECTOMY     1945    There were no vitals filed for this visit.   Subjective Assessment - 12/25/20 1102     Subjective Wants to work on walking w/o cane so he can attend his sons outside wedding on 01/27/21    Pertinent History HTN, cerebellar ataxia, oropharyngeal dysphagia, Sialorrhea, Chronic L ankle pain    Limitations Walking;Other (comment)    How long can you sit comfortably? no issues  How long can you stand comfortably? no issues    How long can you walk comfortably? 1/4 mile    Diagnostic tests 11/05/19: IMPRESSION:1. 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 Moderate chronic small vessel ischemic disease.3. Motion degraded head MRA without evidence of a large or mediumvessel occlusion or significant proximal stenosis.4. Negative neck MRA.  IMPRESSION:1. 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 Mild spinal stenosis and mild-to-moderate neural foraminalstenosis at C6-7, stable to  minimally progressed.    Patient Stated Goals walk 1 mile comfortably, negotiate stairs better    Pain Onset More than a month ago                [facility] PT Assessment - 12/25/20 0001       Ambulation/Gait   Gait velocity 0.57 m/s      Timed Up and Go Test   Normal TUG (seconds) 13.8    TUG Comments 13.8s w/cane, 15s w/o cane      Functional Gait  Assessment   Gait assessed  Yes    Gait Level Surface Walks 20 ft, slow speed, abnormal gait pattern, evidence for imbalance or deviates 10-15 in outside of the 12 in walkway width. Requires more than 7 sec to ambulate 20 ft.    Change in Gait Speed Able to change speed, demonstrates mild gait deviations, deviates 6-10 in outside of the 12 in walkway width, or no gait deviations, unable to achieve a major change in velocity, or uses a change in velocity, or uses an assistive device.    Gait with Horizontal Head Turns Performs head turns smoothly with no change in gait. Deviates no more than 6 in outside 12 in walkway width    Gait with Vertical Head Turns Performs head turns with no change in gait. Deviates no more than 6 in outside 12 in walkway width.    Gait and Pivot Turn Pivot turns safely in greater than 3 sec and stops with no loss of balance, or pivot turns safely within 3 sec and stops with mild imbalance, requires small steps to catch balance.    Step Over Obstacle Is able to step over 2 stacked shoe boxes taped together (9 in total height) without changing gait speed. No evidence of imbalance.    Gait with Narrow Base of Support Ambulates 7-9 steps.    Gait with Eyes Closed Walks 20 ft, slow speed, abnormal gait pattern, evidence for imbalance, deviates 10-15 in outside 12 in walkway width. Requires more than 9 sec to ambulate 20 ft.    Ambulating Backwards Walks 20 ft, uses assistive device, slower speed, mild gait deviations, deviates 6-10 in outside 12 in walkway width.    Steps Alternating feet, must use rail.    Total Score 21                            [facility] Adult PT Treatment/Exercise - 12/25/20 0001       Transfers   Transfers Sit to Stand;Stand to Sit    Sit to Stand 5: Supervision    Stand to Sit 5: Supervision;With upper extremity assist;To bed;To chair/3-in-1      Knee/Hip Exercises: Aerobic   Other Aerobic Scifit 8' L 4 arms 8 targeting 80 steps per minute  PT Short Term Goals - 12/25/20 1140       PT SHORT TERM GOAL #1   Title Patient to demo initial HEP back to PT    Baseline TBD; 11/13/20 Initial HEP established, pt reports    Time 4    Period Weeks    Status Achieved    Target Date 11/27/20      PT SHORT TERM GOAL #2   Title Patient will be able to ambulate 561f across outdoor surface with LRAD under S    Baseline 115 ft with cane and SBA; 11/13/20 10026fambulation with SPC and CGA across paved, sloped and grassy surfaces; 555 ft with st. cane and SBA on level ground    Time 4    Period Weeks    Status Achieved    Target Date 11/27/20      PT SHORT TERM GOAL #3   Title Patient to negotiate steps with single UE and cane with reciprocal pattern    Baseline B UEs on rail w/o cane 4 steps; 11/27/20 Able to negotiate 16 steps with alt pattern using SPC and single rail    Time 4    Period Weeks    Status Achieved    Target Date 11/27/20      PT SHORT TERM GOAL #4   Title Patient to perform 5x STS in <11s w/o UE support w/o bracing knees against seat    Baseline 5x STS in 12.78s w/o UE assist while bracing knees against seat; 11/27/20 Able to complete in 9.4s but braced knees at times 11/30/20 5x STS 11/.78s following Scifit; 15 sec without bracking knees and without UE (12/07/20); 12/11/20 11.7s    Time 4    Period Weeks    Status Achieved    Target Date 11/27/20               PT Long Term Goals - 12/25/20 1110       PT LONG TERM GOAL #1   Title Patient to perform TUG test in <13s with and w/o AD. (all LTGs due 12/25/20)     Baseline Best Tug time 16.0s using cane; 16 sec no AD (12/07/20); 12/25/20 TUG w/AD 13.85, w/o AD 14.59s    Time 8    Period Weeks    Status On-going      PT LONG TERM GOAL #2   Title Patient to ambulate 100029fith LRAD across outdoor surfaces    Baseline 115f45fth SPC and SBA indoor surfaces; 555 feet with st. cane and SBA on level ground (12/07/20); 1200 ft outdoors with suprevision on paved surface, CGA and walking stick on grass    Time 8    Period Weeks    Status Achieved      PT LONG TERM GOAL #3   Title Patient to ambulate at 0.8 m/s with SPC [facility] Initial gait velocity 0.48 m/s, 11/30/20 Gait velocity 0.46m/5m.46m/s68m21/22); 12/25/20 Gait velocity 0.57 m/s    Time 8    Period Weeks    Status On-going      PT LONG TERM GOAL #4   Title Assess progress towards BERG; 11/13/20 BERG goal is 52    Baseline 11/02/20: baseline score of 50/56; 53/56    Time 8    Period Weeks    Status Achieved      PT LONG TERM GOAL #5   Title Assess progress towards FGA/DGI; 11/13/20 FGA goal is 20; 12/25/20 New goal is 22  Baseline 11/02/20: baseline score of 11/30; 12/25/20 FGA score is 21    Time 8    Period Weeks    Status New                   Plan - 12/25/20 1136     Clinical Impression Statement Todays session focused on assement of progress towards LTGs with FGI goal met and revised, TUG and gait velocity goals remain unmet.  Recommend continue OPPT to resolve remaining outstanding goals and achive patient goal of attending his sons outdoor wedding.  All STGs met.    Personal Factors and Comorbidities Age;Comorbidity 2;Past/Current Experience;Time since onset of injury/illness/exacerbation    Comorbidities HTN, cerebellar ataxia, oropharyngeal dysphagia, Sialorrhea, Chronic R ankle pain    Examination-Activity Limitations Carry;Lift;Squat;Stairs;Stand;Transfers    Examination-Participation Restrictions Community Activity;Laundry;Shop;Yard Work    Multimedia programmer Evolving/Moderate complexity    Rehab Potential Good    PT Frequency 2x / week    PT Duration 8 weeks    PT Treatment/Interventions Patient/family education;ADLs/Self Care Home Management;Aquatic Therapy;DME Instruction;Gait training;Stair training;Functional mobility training;Therapeutic activities;Therapeutic exercise;Balance training;Neuromuscular re-education    PT Next Visit Plan Recert for an additional 4 weeks to achieve remainng goals    PT Home Exercise Plan [email or web address](from previous episode)    Consulted and Agree with Plan of Care Patient             Patient will benefit from skilled therapeutic intervention in order to improve the following deficits and impairments:  Abnormal gait, Decreased balance, Decreased activity tolerance, Decreased endurance, Decreased coordination, Decreased mobility, Decreased range of motion, Decreased strength, Difficulty walking, Postural dysfunction, Pain  Visit Diagnosis: Unsteadiness on feet  Muscle weakness (generalized)  Ataxia     Problem List Patient Active Problem List   Diagnosis Date Noted   Persistent atrial fibrillation ([place]) 08/28/2020   Secondary hypercoagulable state ([place]) 07/20/2020   Paroxysmal atrial fibrillation ([place]) 06/19/2020   TIA (transient ischemic attack) 11/05/2019   Essential hypertension 11/05/2019   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 [place], [place], [address] Phone: [phone]   Fax:  [phone]  Name: LEELYND MALDONADO MRN: [id number] Date of Birth: 1941-03-28

## 2020-12-27 ENCOUNTER — Other Ambulatory Visit: Payer: Self-pay

## 2020-12-27 ENCOUNTER — Ambulatory Visit: Payer: Medicare Other

## 2020-12-27 DIAGNOSIS — R270 Ataxia, unspecified: Secondary | ICD-10-CM | POA: Diagnosis not present

## 2020-12-27 DIAGNOSIS — M6281 Muscle weakness (generalized): Secondary | ICD-10-CM

## 2020-12-27 DIAGNOSIS — R2681 Unsteadiness on feet: Secondary | ICD-10-CM

## 2020-12-27 NOTE — Therapy (Signed)
[facility] [address] [place], [place], [address] Phone: [phone]   Fax:  [phone]  Physical Therapy Treatment  Patient Details  Name: Derrick White MRN: [id number] Date of Birth: 1941/01/08 Referring Provider (PT): Dorris Carnes MD   Encounter Date: 12/27/2020   PT End of Session - 12/27/20 1107     Visit Number 16    Number of Visits 20    Date for PT Re-Evaluation 01/26/21    Authorization Type Medicare/Mutal of [facility]- 10th visit progress every 10th visit (last done visit 10 on [id number]) , Re-cert due by [id number];    Authorization - Number of Visits 16    Progress Note Due on Visit 20    PT Start Time 1100    PT Stop Time 1145    PT Time Calculation (min) 45 min    Equipment Utilized During Treatment Gait belt    Activity Tolerance Patient tolerated treatment well    Behavior During Therapy WFL for tasks assessed/performed             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   Cerebellar ataxia ([place])    Dysesthesia 06/04/2016   Essential hypertension 11/05/2019   Foot pain, 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   Gait disturbance 03/16/2019   History of cervical spinal surgery 06/04/2016   History of hiatal hernia    History of kidney stones    Hypertension    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   Memory loss 03/16/2019   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   Pain due to onychomycosis of toenails of both feet 04/28/2019   Pain in left ankle and joints of left foot 08/06/2018   Slurred speech 03/16/2019   Superficial peroneal nerve neuropathy, 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   TIA (transient ischemic attack) 11/05/2019   Worsened handwriting 03/16/2019    Past Surgical History:  Procedure Laterality Date   [place] N/A 09/13/2020   Procedure: CARDIOVERSION;  Surgeon: Sanda Klein, MD;  Location: [facility];  Service: Cardiovascular;   Laterality: N/A;   FRACTURE SURGERY     Left ankle, sx x4   INGUINAL HERNIA REPAIR Right 03/05/2019   Procedure: OPEN RIGHT INGUINAL HERNIA REPAIR WITH MESH;  Surgeon: Clovis Riley, MD;  Location: [place];  Service: General;  Laterality: Right;   [place] N/A 07/27/2020   Procedure: LEFT ATRIAL APPENDAGE OCCLUSION;  Surgeon: Sherren Mocha, MD;  Location: [facility];  Service: Cardiovascular;  Laterality: N/A;   SHOULDER SURGERY Left    TEE WITHOUT CARDIOVERSION N/A 07/27/2020   Procedure: TRANSESOPHAGEAL ECHOCARDIOGRAM (TEE);  Surgeon: Sherren Mocha, MD;  Location: [place] [facility];  Service: Cardiovascular;  Laterality: N/A;   TE[place] WITHOUT CARDIOVERSION N/A 09/13/2020   Procedure: TRANSESOPHAGEAL ECHOCARDIOGRAM (TEE);  Surgeon: Sanda Klein, MD;  Location: [facility]OSCOPY;  Service: Cardiovascular;  Laterality: N/A;   TONSILLECTOMY     1945    There were no vitals filed for this visit.   Subjective Assessment - 12/27/20 1105     Subjective Has been walking at home w/o need of cane at times    Pertinent History HTN, cerebellar ataxia, oropharyngeal dysphagia, Sialorrhea, Chronic L ankle pain    Limitations Walking;Other (comment)    How long can you sit comfortably? no issues    How long can you stand comfortably?  no issues    How long can you walk comfortably? 1/4 mile    Diagnostic tests 11/05/19: IMPRESSION:1. 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 Moderate chronic small vessel ischemic disease.3. Motion degraded head MRA without evidence of a large or mediumvessel occlusion or significant proximal stenosis.4. Negative neck MRA.  IMPRESSION:1. 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 Mild spinal stenosis and mild-to-moderate neural foraminalstenosis at C6-7, stable to minimally progressed.    Patient Stated  Goals walk 1 mile comfortably, negotiate stairs better    Pain Onset More than a month ago                               [facility] Adult PT Treatment/Exercise - 12/27/20 0001       Transfers   Transfers Sit to Stand    Sit to Stand 5: Supervision    Stand to Sit 5: Supervision      Ambulation/Gait   Ambulation/Gait Yes    Ambulation/Gait Assistance 5: Supervision    Ambulation/Gait Assistance Details in clinic    Ambulation Distance (Feet) 100 Feet    Assistive device None    Gait Pattern Ataxic;Step-through pattern    Ambulation Surface Level;Indoor      Knee/Hip Exercises: Aerobic   Other Aerobic Scifit 8' L 4 arms 8 targeting 80 steps per minute                 Balance Exercises - 12/27/20 0001       Balance Exercises: Standing   Retro Gait Foam/compliant surface;5 reps;Limitations    Retro Gait Limitations perfromed in // bars alt b/t fwd and bwd motion    Sidestepping Foam/compliant support;5 reps;Limitations    Sidestepping Limitations performed in // bars    Marching Foam/compliant surface;Dynamic;Limitations    Marching Limitations 1 min duration    Other Standing Exercises performed beanbag kicks with 2 beanbags across 33f distance x2 under S w/o AD, performed placement and retrieval of 2.2# ball and yoga brick from unlevel foam surface 5 trips in // bars    Other Standing Exercises Comments soccer ball manipulation with fingertip support, 1 minute duration per leg                 PT Short Term Goals - 12/25/20 1140       PT SHORT TERM GOAL #1   Title Patient to demo initial HEP back to PT    Baseline TBD; 11/13/20 Initial HEP established, pt reports    Time 4    Period Weeks    Status Achieved    Target Date 11/27/20      PT SHORT TERM GOAL #2   Title Patient will be able to ambulate 5069facross outdoor surface with LRAD under S    Baseline 115 ft with cane and SBA; 11/13/20 100048fmbulation with SPC and CGA across paved,  sloped and grassy surfaces; 555 ft with st. cane and SBA on level ground    Time 4    Period Weeks    Status Achieved    Target Date 11/27/20      PT SHORT TERM GOAL #3   Title Patient to negotiate steps with single UE and cane with reciprocal pattern    Baseline B UEs on rail w/o cane 4 steps; 11/27/20 Able to negotiate 16 steps with alt pattern using SPC and  single rail    Time 4    Period Weeks    Status Achieved    Target Date 11/27/20      PT SHORT TERM GOAL #4   Title Patient to perform 5x STS in <11s w/o UE support w/o bracing knees against seat    Baseline 5x STS in 12.78s w/o UE assist while bracing knees against seat; 11/27/20 Able to complete in 9.4s but braced knees at times 11/30/20 5x STS 11/.78s following Scifit; 15 sec without bracking knees and without UE (12/07/20); 12/11/20 11.7s    Time 4    Period Weeks    Status Achieved    Target Date 11/27/20               PT Long Term Goals - 12/25/20 1110       PT LONG TERM GOAL #1   Title Patient to perform TUG test in <13s with and w/o AD. (all LTGs due 12/25/20)    Baseline Best Tug time 16.0s using cane; 16 sec no AD (12/07/20); 12/25/20 TUG w/AD 13.85, w/o AD 14.59s    Time 8    Period Weeks    Status On-going      PT LONG TERM GOAL #2   Title Patient to ambulate 1084f with LRAD across outdoor surfaces    Baseline 1133fwith SPC and SBA indoor surfaces; 555 feet with st. cane and SBA on level ground (12/07/20); 1200 ft outdoors with suprevision on paved surface, CGA and walking stick on grass    Time 8    Period Weeks    Status Achieved      PT LONG TERM GOAL #3   Title Patient to ambulate at 0.8 m[facility]  Baseline Initial gait velocity 0.48 m/s, 11/30/20 Gait velocity 0.5476m 0.23m35m7/21/22); 12/25/20 Gait velocity 0.57 m/s    Time 8    Period Weeks    Status On-going      PT LONG TERM GOAL #4   Title Assess progress towards BERG; 11/13/20 BERG goal is 52    Baseline 11/02/20: baseline score of 50/56;  53/56    Time 8    Period Weeks    Status Achieved      PT LONG TERM GOAL #5   Title Assess progress towards FGA/DGI; 11/13/20 FGA goal is 20; 12/25/20 New goal is 22    Baseline 11/02/20: baseline score of 11/30; 12/25/20 FGA score is 21    Time 8    Period Weeks    Status New                   Plan - 12/27/20 1153     Clinical Impression Statement Todays session focused on step accuracy, SLS tasks, kicking beanbags across floor to improve foot placement accuracy and improve balance, manipulation of soccer ball needing intermittent UE support.  Activitie added to HEP.  Activities across compliant surface w/o E support including fwd and bwd walking, sidestpping and retriving/placing objects from ground across unlevel and compliant surface.  Continued to require VCs for pacing and foot placement.    Personal Factors and Comorbidities Age;Comorbidity 2;Past/Current Experience;Time since onset of injury/illness/exacerbation    Comorbidities HTN, cerebellar ataxia, oropharyngeal dysphagia, Sialorrhea, Chronic R ankle pain    Examination-Activity Limitations Carry;Lift;Squat;Stairs;Stand;Transfers    Examination-Participation Restrictions Community Activity;Laundry;Shop;Yard Work    StabMerchant navy officerlving/Moderate complexity    Rehab Potential Good    PT Frequency 2x / week    PT  Duration 8 weeks    PT Treatment/Interventions Patient/family education;ADLs/Self Care Home Management;Aquatic Therapy;DME Instruction;Gait training;Stair training;Functional mobility training;Therapeutic activities;Therapeutic exercise;Balance training;Neuromuscular re-education    PT Next Visit Plan Continue foot placement and SLS tasks, tall kneeling    PT Home Exercise Plan [id number](from previous episode)    Consulted and Agree with Plan of Care Patient             Patient will benefit from skilled therapeutic intervention in order to improve the following deficits and impairments:   Abnormal gait, Decreased balance, Decreased activity tolerance, Decreased endurance, Decreased coordination, Decreased mobility, Decreased range of motion, Decreased strength, Difficulty walking, Postural dysfunction, Pain  Visit Diagnosis: Unsteadiness on feet  Muscle weakness (generalized)  Ataxia     Problem List Patient Active Problem List   Diagnosis Date Noted   Persistent atrial fibrillation ([place]) 08/28/2020   Secondary hypercoagulable state ([place]) 07/20/2020   Paroxysmal atrial fibrillation ([place]) 06/19/2020   TIA (transient ischemic attack) 11/05/2019   Essential hypertension 11/05/2019   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   C6 radiculopathy 05/04/2019   Pain due to onychomycosis of toenails of both feet 04/28/2019   Worsened handwriting 03/16/2019   Gait disturbance 03/16/2019   Slurred speech 03/16/2019   Memory loss 03/16/2019   Pain in left ankle and joints of left foot 08/06/2018   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   Foot pain, 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   Dysesthesia 06/04/2016   Superficial peroneal nerve neuropathy, 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

## 2020-12-27 NOTE — Patient Instructions (Signed)
Access Code: [email or web address] URL: [email or web address] Date: 12/27/2020 Prepared by: Sharlynn Oliphant  Exercises Heel Toe Raises with Counter Support - 2 x daily - 7 x weekly - 2 sets - 10 reps Gastroc Stretch on Wall - 2 x daily - 7 x weekly - 2 sets - 1 reps - 30s hold Single Leg Stance Rolling Ball Back and Forth - 2 x daily - 7 x weekly - 2 sets - 1 reps - 60s hold  Instructed to obtain 2 bean bags to kick across floor

## 2021-01-03 ENCOUNTER — Ambulatory Visit: Payer: Medicare Other

## 2021-01-03 ENCOUNTER — Other Ambulatory Visit: Payer: Self-pay

## 2021-01-03 DIAGNOSIS — R270 Ataxia, unspecified: Secondary | ICD-10-CM

## 2021-01-03 DIAGNOSIS — M6281 Muscle weakness (generalized): Secondary | ICD-10-CM | POA: Diagnosis not present

## 2021-01-03 DIAGNOSIS — R2681 Unsteadiness on feet: Secondary | ICD-10-CM | POA: Diagnosis not present

## 2021-01-03 NOTE — Therapy (Signed)
[facility] [address] [place], [place], [address] Phone: [phone]   Fax:  [phone]  Physical Therapy Treatment  Patient Details  Name: Derrick White MRN: [id number] Date of Birth: [birth year] Referring Provider (PT): Dorris Carnes MD   Encounter Date: 01/03/2021   PT End of Session - 01/03/21 1146     Visit Number 17    Number of Visits 20    Date for PT Re-Evaluation 01/26/21    Authorization Type Medicare/Mutal of [facility]- 10th visit progress every 10th visit (last done visit 10 on [id number]) , Re-cert due by [id number];    Authorization - Number of Visits 20    Progress Note Due on Visit 20    PT Start Time 1015    PT Stop Time 1100    PT Time Calculation (min) 45 min    Equipment Utilized During Treatment Gait belt    Activity Tolerance Patient tolerated treatment well    Behavior During Therapy WFL for tasks assessed/performed             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   Cerebellar ataxia ([place])    Dysesthesia 06/04/2016   Essential hypertension 11/05/2019   Foot pain, 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   Gait disturbance 03/16/2019   History of cervical spinal surgery 06/04/2016   History of hiatal hernia    History of kidney stones    Hypertension    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   Memory loss 03/16/2019   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   Pain due to onychomycosis of toenails of both feet 04/28/2019   Pain in left ankle and joints of left foot 08/06/2018   Slurred speech 03/16/2019   Superficial peroneal nerve neuropathy, 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   TIA (transient ischemic attack) 11/05/2019   Worsened handwriting 03/16/2019    Past Surgical History:  Procedure Laterality Date   [place] N/A 09/13/2020   Procedure: CARDIOVERSION;  Surgeon: Sanda Klein, MD;  Location: [facility];  Service: Cardiovascular;   Laterality: N/A;   FRACTURE SURGERY     Left ankle, sx x4   INGUINAL HERNIA REPAIR Right 03/05/2019   Procedure: OPEN RIGHT INGUINAL HERNIA REPAIR WITH MESH;  Surgeon: Clovis Riley, MD;  Location: [facility];  Service: General;  Laterality: Right;   [place] N/A 07/27/2020   Procedure: LEFT ATRIAL APPENDAGE OCCLUSION;  Surgeon: Sherren Mocha, MD;  Location: [facility];  Service: Cardiovascular;  Laterality: N/A;   SHOULDER SURGERY Left    [place] WITHOUT CARDIOVERSION N/A 07/27/2020   Procedure: TRANSESOPHAGEAL ECHOCARDIOGRAM (TEE);  Surgeon: Sherren Mocha, MD;  Location: [facility];  Service: Cardiovascular;  Laterality: N/A;   [place] WITHOUT CARDIOVERSION N/A 09/13/2020   Procedure: TRANSESOPHAGEAL ECHOCARDIOGRAM (TEE);  Surgeon: Sanda Klein, MD;  Location: [facility] END[facility];  Service: Cardiovascular;  Laterality: N/A;   TONSILLECTOMY     1945    There were no vitals filed for this visit.   Subjective Assessment - 01/03/21 1143     Subjective Continues ambulating at home without cane, feels balance may not improve much due to ataxia but has come to terms with with it.    Pertinent History HTN, cerebellar ataxia, oropharyngeal dysphagia, Sialorrhea, Chronic L ankle pain    Limitations Walking;Other (comment)    How long can you sit  comfortably? no issues    How long can you stand comfortably? no issues    How long can you walk comfortably? 1/4 mile    Diagnostic tests 11/05/19: IMPRESSION:1. 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 Moderate chronic small vessel ischemic disease.3. Motion degraded head MRA without evidence of a large or mediumvessel occlusion or significant proximal stenosis.4. Negative neck MRA.  IMPRESSION:1. 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 Mild spinal stenosis and mild-to-moderate neural  foraminalstenosis at C6-7, stable to minimally progressed.    Patient Stated Goals walk 1 mile comfortably, negotiate stairs better    Currently in Pain? No/denies    Pain Onset More than a month ago                               [facility] Adult PT Treatment/Exercise - 01/03/21 0001       Transfers   Transfers Sit to Stand    Sit to Stand 5: Supervision    Stand to Sit 5: Supervision      Ambulation/Gait   Ambulation/Gait Yes    Ambulation/Gait Assistance 5: Supervision    Ambulation Distance (Feet) 400 Feet    Assistive device None    Gait Pattern Ataxic;Step-through pattern    Ambulation Surface Unlevel;Outdoor;Grass;Gravel      Knee/Hip Exercises: Aerobic   Other Aerobic Scifit 8' L 4 arms 8 targeting 80 steps per minute                 Balance Exercises - 01/03/21 0001       Balance Exercises: Standing   Stepping Strategy Anterior;Foam/compliant surface;Limitations    Stepping Strategy Limitations Ambulation across blue mat in // bars with 1/2 roll under mat a midpoint, retrieving and placing 5# and yoga brick for a total of 5 trips back/forth    Retro Gait Foam/compliant surface;5 reps;Limitations    Retro Gait Limitations perfromed in // bars alt b/t fwd and bwd motion    Sidestepping --    Sidestepping Limitations --    Marching Foam/compliant surface;Dynamic;Limitations    Marching Limitations 1 min duration    Other Standing Exercises performed beanbag kicks with 2 beanbags across 64f distance x2 under S w/o AD    Other Standing Exercises Comments soccer ball manipulation with fingertip support, 1 minute duration per leg               PT Education - 01/03/21 1145     Education Details Reviewed HEP of kicking beanbags across floor, has made his own bags from rice and socks and has been compliant    Person(s) Educated Patient    Methods Explanation    Comprehension Verbalized understanding              PT Short Term Goals -  12/25/20 1140       PT SHORT TERM GOAL #1   Title Patient to demo initial HEP back to PT    Baseline TBD; 11/13/20 Initial HEP established, pt reports    Time 4    Period Weeks    Status Achieved    Target Date 11/27/20      PT SHORT TERM GOAL #2   Title Patient will be able to ambulate 504facross outdoor surface with LRAD under S    Baseline 115 ft with cane and SBA; 11/13/20 100047fmbulation with SPC and  CGA across paved, sloped and grassy surfaces; 555 ft with st. cane and SBA on level ground    Time 4    Period Weeks    Status Achieved    Target Date 11/27/20      PT SHORT TERM GOAL #3   Title Patient to negotiate steps with single UE and cane with reciprocal pattern    Baseline B UEs on rail w/o cane 4 steps; 11/27/20 Able to negotiate 16 steps with alt pattern using SPC and single rail    Time 4    Period Weeks    Status Achieved    Target Date 11/27/20      PT SHORT TERM GOAL #4   Title Patient to perform 5x STS in <11s w/o UE support w/o bracing knees against seat    Baseline 5x STS in 12.78s w/o UE assist while bracing knees against seat; 11/27/20 Able to complete in 9.4s but braced knees at times 11/30/20 5x STS 11/.78s following Scifit; 15 sec without bracking knees and without UE (12/07/20); 12/11/20 11.7s    Time 4    Period Weeks    Status Achieved    Target Date 11/27/20               PT Long Term Goals - 12/25/20 1110       PT LONG TERM GOAL #1   Title Patient to perform TUG test in <13s with and w/o AD. (all LTGs due 12/25/20)    Baseline Best Tug time 16.0s using cane; 16 sec no AD (12/07/20); 12/25/20 TUG w/AD 13.85, w/o AD 14.59s    Time 8    Period Weeks    Status On-going      PT LONG TERM GOAL #2   Title Patient to ambulate 1041f with LRAD across outdoor surfaces    Baseline 1118fwith SPC and SBA indoor surfaces; 555 feet with st. cane and SBA on level ground (12/07/20); 1200 ft outdoors with suprevision on paved surface, CGA and walking stick on  grass    Time 8    Period Weeks    Status Achieved      PT LONG TERM GOAL #3   Title [facility]  Baseline Initial gait velocity 0.48 m/s, 11/30/20 Gait velocity 0.5420m 0.44m61m7/21/22); 12/25/20 Gait velocity 0.57 m/s    Time 8    Period Weeks    Status On-going      PT LONG TERM GOAL #4   Title Assess progress towards BERG; 11/13/20 BERG goal is 52    Baseline 11/02/20: baseline score of 50/56; 53/56    Time 8    Period Weeks    Status Achieved      PT LONG TERM GOAL #5   Title Assess progress towards FGA/DGI; 11/13/20 FGA goal is 20; 12/25/20 New goal is 22    Baseline 11/02/20: baseline score of 11/30; 12/25/20 FGA score is 21    Time 8    Period Weeks    Status New                   Plan - 01/03/21 1148     Clinical Impression Statement Todays session continued to focus on gait stability and balance in an effort to allow him to walk across grass for a wedding, able to negotiate 100ft87fof grassy surface with SBA and no AD with mild deviation from straight path but no distinct LOB noted.  Continued to focus on pacing and proper foot placement across unlevel surfaces, maintaining SLS during functional tasks, placing/retrieving objects from ground level and maniputation of objects using feet.  Mild balance losses noted during static tasks less with dynamic tasks.  Patient feels he is making progress despite ataxia.  Less need of UE support noted with static balance tasks, no UE support needed with dynamic tasks.    Personal Factors and Comorbidities Age;Comorbidity 2;Past/Current Experience;Time since onset of injury/illness/exacerbation    Comorbidities HTN, cerebellar ataxia, oropharyngeal dysphagia, Sialorrhea, Chronic R ankle pain    Examination-Activity Limitations Carry;Lift;Squat;Stairs;Stand;Transfers    Examination-Participation Restrictions Community Activity;Laundry;Shop;Yard Work    [employer]chant [employer] officer Evolving/Moderate  complexity    Rehab Potential Good    PT Frequency 2x / week    PT Duration 8 weeks    PT Treatment/Interventions Patient/family education;ADLs/Self Care Home Management;Aquatic Therapy;DME Instruction;Gait training;Stair training;Functional mobility training;Therapeutic activities;Therapeutic exercise;Balance training;Neuromuscular re-education    PT Next Visit Plan tall kneeling and foot placement strategies, focus on static activities    PT Home Exercise Plan [id number](from previous episode)    Consulted and Agree with Plan of Care Patient             Patient will benefit from skilled therapeutic intervention in order to improve the following deficits and impairments:  Abnormal gait, Decreased balance, Decreased activity tolerance, Decreased endurance, Decreased coordination, Decreased mobility, Decreased range of motion, Decreased strength, Difficulty walking, Postural dysfunction, Pain  Visit Diagnosis: Unsteadiness on feet  Muscle weakness (generalized)  Ataxia     Problem List Patient Active Problem List   Diagnosis Date Noted   Persistent atrial fibrillation ([place]) 08/28/2020   Secondary hypercoagulable state ([place]) 07/20/2020   Paroxysmal atrial fibrillation ([place]) 06/19/2020   TIA (transient ischemic attack) 11/05/2019   Essential hypertension 11/05/2019   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/05/2019   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   C6 radiculopathy 05/04/2019   Pain due to onychomycosis of toenails of both feet 04/28/2019   Worsened handwriting 03/16/2019   Gait disturbance 03/16/2019   Slurred speech 03/16/2019   Memory loss 03/16/2019   Pain in left ankle and joints of left foot 08/06/2018   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   Foot pain, 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   Dysesthesia 06/04/2016   Superficial peroneal nerve neuropathy, 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

## 2021-01-10 ENCOUNTER — Ambulatory Visit: Payer: Medicare Other

## 2021-01-10 ENCOUNTER — Other Ambulatory Visit: Payer: Self-pay

## 2021-01-10 DIAGNOSIS — M6281 Muscle weakness (generalized): Secondary | ICD-10-CM

## 2021-01-10 DIAGNOSIS — R270 Ataxia, unspecified: Secondary | ICD-10-CM | POA: Diagnosis not present

## 2021-01-10 DIAGNOSIS — R2681 Unsteadiness on feet: Secondary | ICD-10-CM

## 2021-01-10 NOTE — Therapy (Signed)
[facility] [address] [place], [place], [address] Phone: [phone]   Fax:  [phone]  Physical Therapy Treatment  Patient Details  Name: Derrick White MRN: [id number] Date of Birth: 1940/10/05 Referring Provider (PT): Dorris Carnes MD   Encounter Date: 01/10/2021   PT End of Session - 01/10/21 1058     Visit Number 18    Number of Visits 20    Date for PT Re-Evaluation 01/26/21    Authorization Type Medicare/Mutal of [facility]- 10th visit progress every 10th visit (last done visit 10 on [id number]) , Re-cert due by [id number];    Authorization - Number of Visits 20    Progress Note Due on Visit 20    Equipment Utilized During Treatment Gait belt    Activity Tolerance Patient tolerated treatment well    Behavior During Therapy [facility] for tasks assessed/performed             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   Cerebellar ataxia ([place])    Dysesthesia 06/04/2016   Essential hypertension 11/05/2019   Foot pain, 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   Gait disturbance 03/16/2019   History of cervical spinal surgery 06/04/2016   History of hiatal hernia    History of kidney stones    Hypertension    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   Memory loss 03/16/2019   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   Pain due to onychomycosis of toenails of both feet 04/28/2019   Pain in left ankle and joints of left foot 08/06/2018   Slurred speech 03/16/2019   Superficial peroneal nerve neuropathy, 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   TIA (transient ischemic attack) 11/05/2019   Worsened handwriting 03/16/2019    Past Surgical History:  Procedure Laterality Date   [place] N/A 09/13/2020   Procedure: CARDIOVERSION;  Surgeon: Sanda Klein, MD;  Location: [facility];  Service: Cardiovascular;  Laterality: N/A;   FRACTURE SURGERY     Left ankle, sx x4   INGUINAL HERNIA  REPAIR Right 03/05/2019   Procedure: OPEN RIGHT INGUINAL HERNIA REPAIR WITH MESH;  Surgeon: Clovis Riley, MD;  Location: [facility];  Service: General;  Laterality: Right;   [place] N/A 07/27/2020   Procedure: LEFT ATRIAL APPENDAGE OCCLUSION;  Surgeon: Sherren Mocha, MD;  Location: [facility];  Service: Cardiovascular;  Laterality: N/A;   SHOULDER SURGERY Left    TEE WITHOUT CARDIOVERSION N/A 07/27/2020   Procedure: TRANSESOPHAGEAL ECHOCARDIOGRAM (TEE);  Surgeon: Sherren Mocha, MD;  Location: [facility];  Service: Cardiovascular;  Laterality: N/A;   TEE WITHOUT CARDIOVERSION N/A 09/13/2020   Procedure: TRANSESOPHAGEAL ECHOCARDIOGRAM (TEE);  Surgeon: Sanda Klein, MD;  Location: [facility] ENDOSCOPY;  Service: Cardiovascular;  Laterality: N/A;   TONSILLECTOMY     1945    There were no vitals filed for this visit.   Subjective Assessment - 01/10/21 1024     Subjective Ambulates at home w/o using cane but furniture surfs out of habit    Pertinent History HTN, cerebellar ataxia, oropharyngeal dysphagia, Sialorrhea, Chronic L ankle pain    Limitations Walking;Other (comment)    How long can you sit comfortably? no issues    How long can you stand comfortably? no issues    How long can you walk comfortably? 1/4 mile    Diagnostic tests 11/05/19: IMPRESSION:1. 3 mm  acute posterior left temporal infarct.2. Moderate chronic small vessel ischemic disease.3. Motion degraded head MRA without evidence of a large or mediumvessel occlusion or significant proximal stenosis.4. Negative neck MRA.  IMPRESSION:1. 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 Mild spinal stenosis and mild-to-moderate neural foraminalstenosis at C6-7, stable to minimally progressed.    Patient Stated Goals walk 1 mile comfortably, negotiate stairs better    Pain  Onset More than a month ago                               [facility] Adult PT Treatment/Exercise - 01/10/21 0001       Transfers   Transfers Sit to Stand;Stand to Sit    Sit to Stand 5: Supervision    Stand to Sit 5: Supervision    Number of Reps 10 reps    Comments peformed from airex encouraging use of UEs      Ambulation/Gait   Ambulation/Gait Yes    Ambulation/Gait Assistance 5: Supervision    Ambulation Distance (Feet) 100 Feet    Assistive device None    Gait Pattern Ataxic;Step-through pattern    Ambulation Surface Level;Indoor    Gait Comments equal step length with slower cadence      Lumbar Exercises: Quadruped   Other Quadruped Lumbar Exercises tall kneel squats x10, followed by 10 more with OH reach      Knee/Hip Exercises: Aerobic   Other Aerobic Scifit 8' L 4.5 arms 8 targeting 80 steps per minute                 Balance Exercises - 01/10/21 0001       Balance Exercises: Standing   SLS with Vectors Solid surface;Foam/compliant surface;Limitations    SLS with Vectors Limitations performed cone taps in // bars starting with compliant surface which was too challenging, transitioned to solid surface for unilateral taps then cross body taps which he was able to complete with no errors.    Retro Gait Limitations    Retro Gait Limitations perfromed with CGA/SBA for 73f w/o any lateral deviations               PT Education - 01/10/21 1159     Education Details Instructed to refrain from furniture surfing unless needed to maintain balance    Person(s) Educated Patient    Methods Explanation    Comprehension Verbalized understanding              PT Short Term Goals - 12/25/20 1140       PT SHORT TERM GOAL #1   Title Patient to demo initial HEP back to PT    Baseline TBD; 11/13/20 Initial HEP established, pt reports    Time 4    Period Weeks    Status Achieved    Target Date 11/27/20      PT SHORT TERM GOAL #2   Title  Patient will be able to ambulate 50103facross outdoor surface with LRAD under S    Baseline 115 ft with cane and SBA; 11/13/20 100084fmbulation with SPC and CGA across paved, sloped and grassy surfaces; 555 ft with st. cane and SBA on level ground    Time 4    Period Weeks    Status Achieved    Target Date 11/27/20      PT SHORT TERM GOAL #3  Title Patient to negotiate steps with single UE and cane with reciprocal pattern    Baseline B UEs on rail w/o cane 4 steps; 11/27/20 Able to negotiate 16 steps with alt pattern using SPC and single rail    Time 4    Period Weeks    Status Achieved    Target Date 11/27/20      PT SHORT TERM GOAL #4   Title Patient to perform 5x STS in <11s w/o UE support w/o bracing knees against seat    Baseline 5x STS in 12.78s w/o UE assist while bracing knees against seat; 11/27/20 Able to complete in 9.4s but braced knees at times 11/30/20 5x STS 11/.78s following Scifit; 15 sec without bracking knees and without UE (12/07/20); 12/11/20 11.7s    Time 4    Period Weeks    Status Achieved    Target Date 11/27/20               PT Long Term Goals - 12/25/20 1110       PT LONG TERM GOAL #1   Title Patient to perform TUG test in <13s with and w/o AD. (all LTGs due 12/25/20)    Baseline Best Tug time 16.0s using cane; 16 sec no AD (12/07/20); 12/25/20 TUG w/AD 13.85, w/o AD 14.59s    Time 8    Period Weeks    Status On-going      PT LONG TERM GOAL #2   Title Patient to ambulate 1066f with LRAD across outdoor surfaces    Baseline 1180fwith SPC and SBA indoor surfaces; 555 feet with st. cane and SBA on level ground (12/07/20); 1200 ft outdoors with suprevision on paved surface, CGA and walking stick on grass    Time 8    Period Weeks    Status Achieved      PT LONG TERM GOAL #3   Title [facility]  Baseline Initial gait velocity 0.48 m/s, 11/30/20 Gait velocity 0.5458m 0.31m44m7/21/22); 12/25/20 Gait velocity 0.57 m/s    Time 8     Period Weeks    Status On-going      PT LONG TERM GOAL #4   Title Assess progress towards BERG; 11/13/20 BERG goal is 52    Baseline 11/02/20: baseline score of 50/56; 53/56    Time 8    Period Weeks    Status Achieved      PT LONG TERM GOAL #5   Title Assess progress towards FGA/DGI; 11/13/20 FGA goal is 20; 12/25/20 New goal is 22    Baseline 11/02/20: baseline score of 11/30; 12/25/20 FGA score is 21    Time 8    Period Weeks    Status New                   Plan - 01/10/21 1159     Clinical Impression Statement Treatment focus today was hip and core stability training in tall kneel, continued LE sterngth and aerobic training, coordination tasks of foot placement from airex starting with cones but reverting to solid surface due to advanced difficulty of task.  Ambulating with slower cadence and equal step length, no need of cane with SBA.  Ataxic movement s preesnt but lessened by pacing.    Personal Factors and Comorbidities Age;Comorbidity 2;Past/Current Experience;Time since onset of injury/illness/exacerbation    Comorbidities HTN, cerebellar ataxia, oropharyngeal dysphagia, Sialorrhea, Chronic R ankle pain    Examination-Activity Limitations Carry;Lift;Squat;Stairs;Stand;Transfers  Examination-Participation Restrictions Community Activity;Laundry;Shop;Yard Work    [employer] officer Evolving/Moderate complexity    Rehab Potential Good    PT Frequency 2x / week    PT Duration 8 weeks    PT Treatment/Interventions Patient/family education;ADLs/Self Care Home Management;Aquatic Therapy;DME Instruction;Gait training;Stair training;Functional mobility training;Therapeutic activities;Therapeutic exercise;Balance training;Neuromuscular re-education    PT Next Visit Plan tall kneeling and foot placement strategies, focus on static activities and cadence, begin LTG assessment    PT Home Exercise Plan [id number](from previous episode)    Consulted and Agree with Plan  of Care Patient             Patient will benefit from skilled therapeutic intervention in order to improve the following deficits and impairments:  Abnormal gait, Decreased balance, Decreased activity tolerance, Decreased endurance, Decreased coordination, Decreased mobility, Decreased range of motion, Decreased strength, Difficulty walking, Postural dysfunction, Pain  Visit Diagnosis: Unsteadiness on feet  Muscle weakness (generalized)  Ataxia     Problem List Patient Active Problem List   Diagnosis Date Noted   Persistent atrial fibrillation ([place]) 08/28/2020   Secondary hypercoagulable state ([place]) 07/20/2020   Paroxysmal atrial fibrillation ([place]) 06/19/2020   TIA (transient ischemic attack) 11/05/2019   Essential hypertension 11/05/2019   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 [place], [place], [address] Phone: [phone]   Fax:  [phone]  Name: Derrick White MRN: [id number] Date of Birth: 11/24/1940

## 2021-01-16 ENCOUNTER — Encounter: Payer: Self-pay | Admitting: Cardiology

## 2021-01-16 ENCOUNTER — Other Ambulatory Visit: Payer: Self-pay

## 2021-01-16 ENCOUNTER — Ambulatory Visit (INDEPENDENT_AMBULATORY_CARE_PROVIDER_SITE_OTHER): Payer: Medicare Other | Admitting: Cardiology

## 2021-01-16 VITALS — BP 122/62 | HR 62 | Ht 70.0 in | Wt 176.0 lb

## 2021-01-16 DIAGNOSIS — Z79899 Other long term (current) drug therapy: Secondary | ICD-10-CM

## 2021-01-16 DIAGNOSIS — I4819 Other persistent atrial fibrillation: Secondary | ICD-10-CM

## 2021-01-16 DIAGNOSIS — Z95818 Presence of other cardiac implants and grafts: Secondary | ICD-10-CM | POA: Diagnosis not present

## 2021-01-16 DIAGNOSIS — G119 Hereditary ataxia, unspecified: Secondary | ICD-10-CM | POA: Diagnosis not present

## 2021-01-16 DIAGNOSIS — I480 Paroxysmal atrial fibrillation: Secondary | ICD-10-CM

## 2021-01-16 NOTE — Patient Instructions (Signed)
Medication Instructions:  Your physician recommends that you continue on your current medications as directed. Please refer to the Current Medication list given to you today.  Labwork: None ordered.  Testing/Procedures: None ordered.  Follow-Up: Your physician wants you to follow-up in: 6 months with  Lars Mage, MD    You will receive a reminder letter in the mail two months in advance. If you don't receive a letter, please call our office to schedule the follow-up appointment.   Any Other Special Instructions Will Be Listed Below (If Applicable).  If you need a refill on your cardiac medications before your next appointment, please call your pharmacy.

## 2021-01-16 NOTE — Progress Notes (Signed)
Electrophysiology Office Follow up Visit Note:    Date:  01/16/2021   ID:  Derrick White, Derrick White 06-05-1940, MRN [id number]  PCP:  Haywood Pao, MD  [facility] Cardiologist:  Dorris Carnes, MD  [facility] Electrophysiologist:  Vickie Epley, MD    Interval History:    Derrick White is a 80 y.o. male who presents for a follow up visit.  He underwent a successful watchman implant on July 27, 2020.  Follow-up transesophageal echo on September 13, 2020 showed a well-seated watchman device without leak or thrombus.  Is currently taking Plavix 75 mg by mouth once daily.  He was set up for TEE cardioversion on September 13, 2020 which was performed but complicated by early recurrence of atrial fibrillation.  He was seen in follow-up by Benjaman Pott on Oct 10, 2020.  At that appointment amiodarone was started.  He presented again for cardioversion on November 21, 2020 but was back in normal sinus rhythm at that time.  He remains in sinus rhythm today.  He tells me he is feeling better now that he is back in normal rhythm.  The goal was to continue Eliquis 5 mg by mouth twice daily for 1 month after his chemical cardioversion.  He transitioned to Plavix 75 mg by mouth once daily on December 14, 2020.     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   Cerebellar ataxia ([place])    Dysesthesia 06/04/2016   Essential hypertension 11/05/2019   Foot pain, 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   Gait disturbance 03/16/2019   History of cervical spinal surgery 06/04/2016   History of hiatal hernia    History of kidney stones    Hypertension    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   Memory loss 03/16/2019   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   Pain due to onychomycosis of toenails of both feet 04/28/2019   Pain in left ankle and joints of left foot 08/06/2018   Slurred speech 03/16/2019   Superficial peroneal nerve neuropathy, 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   TIA (transient ischemic attack) 11/05/2019   Worsened  handwriting 03/16/2019    Past Surgical History:  Procedure Laterality Date   [place] N/A 09/13/2020   Procedure: CARDIOVERSION;  Surgeon: Sanda Klein, MD;  Location: [place];  Service: Cardiovascular;  Laterality: N/A;   FRACTURE SURGERY     Left ankle, sx x4   INGUINAL HERNIA REPAIR Right 03/05/2019   Procedure: OPEN RIGHT INGUINAL HERNIA REPAIR WITH MESH;  Surgeon: Clovis Riley, MD;  Location: [place];  Service: General;  Laterality: Right;   [place] N/A 07/27/2020   Procedure: LEFT ATRIAL APPENDAGE OCCLUSION;  Surgeon: Sherren Mocha, MD;  Location: [place] [facility];  Service: Cardiovascular;  Laterality: N/A;   SHOULDER SURGERY Left    TE[place] WITHOUT CARDIOVERSION N/A 07/27/2020   Procedure: TRANSESOPHAGEAL ECHOCARDIOGRAM (TEE);  Surgeon: Sherren Mocha, MD;  Location: [place] [facility];  Service: Cardiovascular;  Laterality: N/A;   TE[place] WITHOUT CARDIOVERSION N/A 09/13/2020   Procedure: TRANSESOPHAGEAL ECHOCARDIOGRAM (TEE);  Surgeon: Sanda Klein, MD;  [facility];  Service: Cardiovascular;  Laterality: N/A;   TONSILLECTOMY     1945    Current Medications: Current Meds  Medication Sig   acetaminophen (TYLENOL) 500 MG tablet Take 500 mg by mouth every 6 (six) hours as needed  for moderate pain or headache.   amiodarone (PACERONE) 200 MG tablet Take 1 tablet (200 mg total) by mouth daily.   atorvastatin (LIPITOR) 40 MG tablet TAKE 1 TABLET DAILY   Cholecalciferol 25 MCG (1000 UT) tablet Take 1,000 Units by mouth at bedtime.   clopidogrel (PLAVIX) 75 MG tablet Take 1 tablet (75 mg total) by mouth daily. Start on 7/28   diphenhydramine-acetaminophen (TYLENOL PM) 25-500 MG TABS tablet Take 1 tablet by mouth at bedtime as needed (sleep).   ELIQUIS 5 MG TABS tablet TAKE 1 TABLET TWICE A DAY   fluticasone (FLONASE) 50 MCG/ACT nasal spray Place 1 spray  into both nostrils daily as needed for allergies or rhinitis.   neomycin-polymyxin b-dexamethasone (MAXITROL) 3.5-10000-0.1 SUSP SMARTSIG:In Eye(s)   pantoprazole (PROTONIX) 40 MG tablet Take 40 mg by mouth in the morning.   Probiotic Product (ALIGN) 4 MG CAPS Take by mouth in the morning.   Propylene Glycol (SYSTANE BALANCE) 0.6 % SOLN Place 1 drop into both eyes 2 (two) times daily as needed (burning).   telmisartan (MICARDIS) 80 MG tablet Take 80 mg by mouth at bedtime.   vitamin B-12 (CYANOCOBALAMIN) 1000 MCG tablet Take 1,000 mcg by mouth at bedtime.   vitamin E 180 MG (400 UNITS) capsule Take 400 Units by mouth at bedtime.     Allergies:   Morphine, Codeine, Propoxyphene, and Shrimp [shellfish allergy]   Social History   Socioeconomic History   Marital status: Married    Spouse name: Not on file   Number of children: Not on file   Years of education: Not on file   Highest education level: Not on file  Occupational History   Occupation: retired  Tobacco Use   Smoking status: Former    Packs/day: 1.00    Years: 30.00    Pack years: 30.00    Types: Cigarettes    Quit date: 1995    Years since quitting: 27.6   Smokeless tobacco: Never  Vaping Use   Vaping Use: Never used  Substance and Sexual Activity   Alcohol use: Yes    Alcohol/week: 16.0 standard drinks    Types: 14 Cans of beer, 2 Standard drinks or equivalent per week    Comment: 2 beers a day   Drug use: Never   Sexual activity: Not on file  Other Topics Concern   Not on file  Social History Narrative   Not on file   Social Determinants of Health   Financial Resource Strain: Not on file  Food Insecurity: Not on file  Transportation Needs: Not on file  Physical Activity: Not on file  Stress: Not on file  Social Connections: Not on file     Family History: The patient's family history is negative for Stroke.  ROS:   Please see the history of present illness.    All other systems reviewed and are  negative.  EKGs/Labs/Other Studies Reviewed:    The following studies were reviewed today: Prior notes  EKG:  The ekg ordered today demonstrates sinus rhythm.  Recent Labs: 10/10/2020: ALT 19; TSH 2.760 10/31/2020: BUN 19; Creatinine, Ser 1.09; Hemoglobin 14.8; Platelets 336; Potassium 3.8; Sodium 138  Recent Lipid Panel    Component Value Date/Time   CHOL 128 01/07/2020 1124   TRIG 107 01/07/2020 1124   HDL 50 01/07/2020 1124   CHOLHDL 2.6 01/07/2020 1124   CHOLHDL 3.2 11/05/2019 0959   VLDL 32 11/05/2019 0959   LDLCALC 58 01/07/2020 1124    Physical Exam:  VS:  BP 122/62   Pulse 62   Ht '5\' 10"'$  (1.778 m)   Wt 176 lb (79.8 kg)   BMI 25.25 kg/m     Wt Readings from Last 3 Encounters:  01/16/21 176 lb (79.8 kg)  11/21/20 176 lb 9.6 oz (80.1 kg)  10/31/20 178 lb (80.7 kg)     GEN:  Well nourished, well developed in no acute distress HEENT: Normal NECK: No JVD; No carotid bruits LYMPHATICS: No lymphadenopathy CARDIAC: RRR, no murmurs, rubs, gallops RESPIRATORY:  Clear to auscultation without rales, wheezing or rhonchi  ABDOMEN: Soft, non-tender, non-distended MUSCULOSKELETAL:  No edema; No deformity  SKIN: Warm and dry NEUROLOGIC:  Alert and oriented x 3 PSYCHIATRIC:  Normal affect   ASSESSMENT:    1. Persistent atrial fibrillation ([place])   2. Cerebellar ataxia ([place])   3. Presence of Watchman left atrial appendage closure device   4. Encounter for long-term (current) use of high-risk medication    PLAN:    In order of problems listed above:   1.  Persistent atrial fibrillation (HCC) The patient is maintaining normal rhythm on amiodarone 200 mg by mouth once daily.  He is feeling much better now that he is in normal rhythm.  Continue amiodarone 200 mg by mouth once daily.  We will check a CMP, TSH and free T4 in about 3 months.  I will plan to see him back in about 6 months.  For now, continue Plavix 75 mg by mouth once daily.  We will plan to continue  this regimen until the 71-monthmark after watchman implant.  At that time, he will transition to aspirin 81 mg by mouth once daily monotherapy.  2. [place]axia [place]   3. Presence of Watchman left atrial appendage closure device Doing well after watchman implant.  Continue Plavix 75 mg by mouth once daily for now.  At the 659-monthark following implant, plan to transition to aspirin 81 mg by mouth once daily monotherapy.  4.  High risk medication monitoring CMP, TSH and free T4 as above.  EKG today okay.    Follow-up 6 months.     Medication Adjustments/Labs and Tests Ordered: Current medicines are reviewed at length with the patient today.  Concerns regarding medicines are outlined above.  Orders Placed This Encounter  Procedures   EKG 12-Lead   No orders of the defined types were placed in this encounter.    Signed, CaLars MageMD, FAAdvanced [facility] LLCFHMcleod [facility]acoast/30/2022 8:08 PM    Electrophysiology [facility]

## 2021-01-18 ENCOUNTER — Ambulatory Visit: Payer: Medicare Other | Attending: Internal Medicine

## 2021-01-18 ENCOUNTER — Other Ambulatory Visit: Payer: Self-pay

## 2021-01-18 ENCOUNTER — Telehealth: Payer: Self-pay | Admitting: Cardiology

## 2021-01-18 DIAGNOSIS — R2681 Unsteadiness on feet: Secondary | ICD-10-CM | POA: Insufficient documentation

## 2021-01-18 DIAGNOSIS — R270 Ataxia, unspecified: Secondary | ICD-10-CM

## 2021-01-18 DIAGNOSIS — M6281 Muscle weakness (generalized): Secondary | ICD-10-CM | POA: Insufficient documentation

## 2021-01-18 NOTE — Telephone Encounter (Signed)
Went over instructions with wife. Verbalized understanding.

## 2021-01-18 NOTE — Telephone Encounter (Signed)
Patient's wife call to get a copy of patients discharge papers or asking the nurse to go over with her whats supposed to change for the patient. Please advise

## 2021-01-18 NOTE — Therapy (Signed)
[facility] [address] [place], [place], [address] Phone: [phone]   Fax:  [phone]  Physical Therapy Treatment  Patient Details  Name: Derrick White MRN: [id number] Date of Birth: [birth year] Referring Provider (PT): Dorris Carnes MD   Encounter Date: 01/18/2021   PT End of Session - 01/18/21 1106     Visit Number 19    Number of Visits 20    Date for PT Re-Evaluation 01/26/21    Authorization Type Medicare/[employer] [facility]- 10th visit progress every 10th visit (last done visit 10 on [id number]) , Re-cert due by [id number];    Authorization - Number of Visits 20    Progress Note Due on Visit 20    PT Start Time 1105    PT Stop Time 1145    PT Time Calculation (min) 40 min    Equipment Utilized During Treatment Gait belt    Activity Tolerance Patient tolerated treatment well    Behavior During Therapy WFL for tasks assessed/performed             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   Cerebellar ataxia ([place])    Dysesthesia 06/04/2016   Essential hypertension 11/05/2019   Foot pain, 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   Gait disturbance 03/16/2019   History of cervical spinal surgery 06/04/2016   History of hiatal hernia    History of kidney stones    Hypertension    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   Memory loss 03/16/2019   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   Pain due to onychomycosis of toenails of both feet 04/28/2019   Pain in left ankle and joints of left foot 08/06/2018   Slurred speech 03/16/2019   Superficial peroneal nerve neuropathy, 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   TIA (transient ischemic attack) 11/05/2019   Worsened handwriting 03/16/2019    Past Surgical History:  Procedure Laterality Date   [place] N/A 09/13/2020   Procedure: CARDIOVERSION;  Surgeon: Sanda Klein, MD;  Location: [place];  Service: Cardiovascular;   Laterality: N/A;   FRACTURE SURGERY     Left ankle, sx x4   INGUINAL HERNIA REPAIR Right 03/05/2019   Procedure: OPEN RIGHT INGUINAL HERNIA REPAIR WITH MESH;  Surgeon: Clovis Riley, MD;  Location: [place];  Service: General;  Laterality: Right;   [place] N/A 07/27/2020   Procedure: LEFT ATRIAL APPENDAGE OCCLUSION;  Surgeon: Sherren Mocha, MD;  Location: [place] [facility];  Service: Cardiovascular;  Laterality: N/A;   SHOULDER SURGERY Left    TEE WITHOUT CARDIOVERSION N/A 07/27/2020   Procedure: TRANSESOPHAGEAL ECHOCARDIOGRAM (TEE);  Surgeon: Sherren Mocha, MD;  Location: [place] [facility];  Service: Cardiovascular;  Laterality: N/A;   TEE WITHOUT CARDIOVERSION N/A 09/13/2020   Procedure: TRANSESOPHAGEAL ECHOCARDIOGRAM (TEE);  Surgeon: Sanda Klein, MD;  Location: [facility];  Service: Cardiovascular;  Laterality: N/A;   TONSILLECTOMY     1945    There were no vitals filed for this visit.   Subjective Assessment - 01/18/21 1107     Subjective Compliant with HEP of bean bag kicking.  Feels confident about walking on grass for upcoming wedding    Pertinent History HTN, cerebellar ataxia, oropharyngeal dysphagia, Sialorrhea, Chronic L ankle pain    Limitations Walking;Other (comment)    How long can you sit comfortably? no issues  How long can you stand comfortably? no issues    How long can you walk comfortably? 1/4 mile    Diagnostic tests 11/05/19: IMPRESSION:1. 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 Moderate chronic small vessel ischemic disease.3. Motion degraded head MRA without evidence of a large or mediumvessel occlusion or significant proximal stenosis.4. Negative neck MRA.  IMPRESSION:1. 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 Mild spinal stenosis and mild-to-moderate neural foraminalstenosis at C6-7,  stable to minimally progressed.    Patient Stated Goals walk 1 mile comfortably, negotiate stairs better    Pain Onset More than a month ago                               [facility] Adult PT Treatment/Exercise - 01/18/21 0001       Transfers   Transfers Sit to Stand;Stand to Sit    Sit to Stand 6: Modified independent (Device/Increase time)    Stand to Sit 6: Modified independent (Device/Increase time)    Number of Reps 10 reps    Comments knee did not touch table      Ambulation/Gait   Ambulation/Gait Yes    Ambulation/Gait Assistance 6: Modified independent (Device/Increase time)    Ambulation Distance (Feet) 1000 Feet    Assistive device None;Straight cane    Gait Pattern Ataxic;Step-through pattern    Ambulation Surface Level;Outdoor;Gravel;Unlevel    Gait Comments cued to slow cadence                 Balance Exercises - 01/18/21 0001       Balance Exercises: Standing   SLS with Vectors Foam/compliant surface;Limitations    SLS with Vectors Limitations from airex, tapping pebbles unilateral to 2 targets, alternating to 2 targets, cross body to 2 targets and cross body to 1 target, 10 reps per leg with only 2 LOB epidoes with patient able to self correct    Stepping Strategy Limitations    Stepping Strategy Limitations 156f ambulation w/o cane stepping over stripes on walking track    Marching Foam/compliant surface;Dynamic;Limitations    Marching Limitations 10x alternating LEs                 PT Short Term Goals - 12/25/20 1140       PT SHORT TERM GOAL #1   Title Patient to demo initial HEP back to PT    Baseline TBD; 11/13/20 Initial HEP established, pt reports    Time 4    Period Weeks    Status Achieved    Target Date 11/27/20      PT SHORT TERM GOAL #2   Title Patient will be able to ambulate 5060facross outdoor surface with LRAD under S    Baseline 115 ft with cane and SBA; 11/13/20 100093fmbulation with SPC and CGA across  paved, sloped and grassy surfaces; 555 ft with st. cane and SBA on level ground    Time 4    Period Weeks    Status Achieved    Target Date 11/27/20      PT SHORT TERM GOAL #3   Title Patient to negotiate steps with single UE and cane with reciprocal pattern    Baseline B UEs on rail w/o cane 4 steps; 11/27/20 Able to negotiate 16 steps with alt pattern using SPC and single rail    Time 4  Period Weeks    Status Achieved    Target Date 11/27/20      PT SHORT TERM GOAL #4   Title Patient to perform 5x STS in <11s w/o UE support w/o bracing knees against seat    Baseline 5x STS in 12.78s w/o UE assist while bracing knees against seat; 11/27/20 Able to complete in 9.4s but braced knees at times 11/30/20 5x STS 11/.78s following Scifit; 15 sec without bracking knees and without UE (12/07/20); 12/11/20 11.7s    Time 4    Period Weeks    Status Achieved    Target Date 11/27/20               PT Long Term Goals - 12/25/20 1110       PT LONG TERM GOAL #1   Title Patient to perform TUG test in <13s with and w/o AD. (all LTGs due 12/25/20)    Baseline Best Tug time 16.0s using cane; 16 sec no AD (12/07/20); 12/25/20 TUG w/AD 13.85, w/o AD 14.59s    Time 8    Period Weeks    Status On-going      PT LONG TERM GOAL #2   Title Patient to ambulate 1065f with LRAD across outdoor surfaces    Baseline 1179fwith SPC and SBA indoor surfaces; 555 feet with st. cane and SBA on level ground (12/07/20); 1200 ft outdoors with suprevision on paved surface, CGA and walking stick on grass    Time 8    Period Weeks    Status Achieved      PT LONG TERM GOAL #3   Title Patient to ambulate at 0.8 m/s with [facility]  Baseline Initial gait velocity 0.48 m/s, 11/30/20 Gait velocity 0.5430m 0.25m18m7/21/22); 12/25/20 Gait velocity 0.57 m/s    Time 8    Period Weeks    Status On-going      PT LONG TERM GOAL #4   Title Assess progress towards BERG; 11/13/20 BERG goal is 52    Baseline 11/02/20: baseline score of  50/56; 53/56    Time 8    Period Weeks    Status Achieved      PT LONG TERM GOAL #5   Title Assess progress towards FGA/DGI; 11/13/20 FGA goal is 20; 12/25/20 New goal is 22    Baseline 11/02/20: baseline score of 11/30; 12/25/20 FGA score is 21    Time 8    Period Weeks    Status New                   Plan - 01/18/21 1140     Clinical Impression Statement Todays session focused on gait training over solid and grassy surfaces, introducing fog 8s, OH reaching from sloped surfaces and stepping over stripes on walkig track to simulate puddles.  Has increasaed his confidence leve regarding walking across grass for his wedding.  Patient demoing much more accurate stepping tasks with less need of UE support    Personal Factors and Comorbidities Age;Comorbidity 2;Past/Current Experience;Time since onset of injury/illness/exacerbation    Comorbidities HTN, cerebellar ataxia, oropharyngeal dysphagia, Sialorrhea, Chronic R ankle pain    Examination-Activity Limitations Carry;Lift;Squat;Stairs;Stand;Transfers    Examination-Participation Restrictions Community Activity;Laundry;Shop;Yard Work    StabMerchant navy officerlving/Moderate complexity    Rehab Potential Good    PT Frequency 2x / week    PT Duration 8 weeks    PT Treatment/Interventions Patient/family education;ADLs/Self Care Home Management;Aquatic Therapy;DME Instruction;Gait training;Stair training;Functional mobility training;Therapeutic activities;Therapeutic exercise;Balance  training;Neuromuscular re-education    PT Next Visit Plan Assess LTGs for DC    PT Home Exercise Plan [id number](from previous episode)    Consulted and Agree with Plan of Care Patient             Patient will benefit from skilled therapeutic intervention in order to improve the following deficits and impairments:  Abnormal gait, Decreased balance, Decreased activity tolerance, Decreased endurance, Decreased coordination, Decreased mobility,  Decreased range of motion, Decreased strength, Difficulty walking, Postural dysfunction, Pain  Visit Diagnosis: Unsteadiness on feet  Muscle weakness (generalized)  Ataxia     Problem List Patient Active Problem List   Diagnosis Date Noted   Persistent atrial fibrillation ([place]) 08/28/2020   Secondary hypercoagulable state ([place]) 07/20/2020   Paroxysmal atrial fibrillation ([place]) 06/19/2020   TIA (transient ischemic attack) 11/05/2019   Essential hypertension 11/05/2019   Cerebellar ataxia ([place]) 11/05/2019   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   C6 radiculopathy 05/04/2019   Pain due to onychomycosis of toenails of both feet 04/28/2019   Worsened handwriting 03/16/2019   Gait disturbance 03/16/2019   Slurred speech 03/16/2019   Memory loss 03/16/2019   Pain in left ankle and joints of left foot 08/06/2018   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   Foot pain, 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   Dysesthesia 06/04/2016   Superficial peroneal nerve neuropathy, 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

## 2021-01-23 DIAGNOSIS — H0100A Unspecified blepharitis right eye, upper and lower eyelids: Secondary | ICD-10-CM | POA: Diagnosis not present

## 2021-01-23 DIAGNOSIS — H02055 Trichiasis without entropian left lower eyelid: Secondary | ICD-10-CM | POA: Diagnosis not present

## 2021-01-23 DIAGNOSIS — H0100B Unspecified blepharitis left eye, upper and lower eyelids: Secondary | ICD-10-CM | POA: Diagnosis not present

## 2021-01-24 ENCOUNTER — Other Ambulatory Visit: Payer: Self-pay

## 2021-01-24 ENCOUNTER — Ambulatory Visit: Payer: Medicare Other

## 2021-01-24 DIAGNOSIS — R2681 Unsteadiness on feet: Secondary | ICD-10-CM

## 2021-01-24 DIAGNOSIS — M6281 Muscle weakness (generalized): Secondary | ICD-10-CM

## 2021-01-24 DIAGNOSIS — R270 Ataxia, unspecified: Secondary | ICD-10-CM | POA: Diagnosis not present

## 2021-01-24 NOTE — Therapy (Signed)
[facility] [address] [place], [place], [address] Phone: [phone]   Fax:  [phone]  Physical Therapy Treatment/DC Summary  Patient Details  Name: Derrick White MRN: [id number] Date of Birth: 08-Jan-1941 Referring Provider (PT): Dorris Carnes MD   Encounter Date: 01/24/2021    Visits from Start of Care: 20  Current functional level related to goals / functional outcomes: Patient at maximum rehab potential and meets threshold for limited community ambulation with a decreased fall risk per FGA    Remaining deficits: ataxia   Education / Equipment: HEP   Patient agrees to discharge. Patient goals were met. Patient is being discharged due to being pleased with the current functional level.    PT End of Session - 01/24/21 1104     Visit Number 20    Number of Visits 20    Date for PT Re-Evaluation 01/26/21    Authorization Type Medicare/[facility] [facility]- 10th visit progress every 10th visit (last done visit 10 on 06/21/52) , Re-cert due by 06/26/04;    Authorization - Number of Visits 20    Progress Note Due on Visit 20    PT Start Time 1100    PT Stop Time 1130    PT Time Calculation (min) 30 min    Equipment Utilized During Treatment Gait belt    Activity Tolerance Patient tolerated treatment well    Behavior During Therapy WFL for tasks assessed/performed             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   Cerebellar ataxia ([place])    Dysesthesia 06/04/2016   Essential hypertension 11/05/2019   Foot pain, 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   Gait disturbance 03/16/2019   History of cervical spinal surgery 06/04/2016   History of hiatal hernia    History of kidney stones    Hypertension    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   Memory loss 03/16/2019   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   Pain due to onychomycosis of toenails of both feet 04/28/2019   Pain in left ankle and joints of  left foot 08/06/2018   Slurred speech 03/16/2019   Superficial peroneal nerve neuropathy, 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   TIA (transient ischemic attack) 11/05/2019   Worsened handwriting 03/16/2019    Past Surgical History:  Procedure Laterality Date   [place] N/A 09/13/2020   Procedure: CARDIOVERSION;  Surgeon: Sanda Klein, MD;  Location: [facility];  Service: Cardiovascular;  Laterality: N/A;   FRACTURE SURGERY     Left ankle, sx x4   INGUINAL HERNIA REPAIR Right 03/05/2019   Procedure: OPEN RIGHT INGUINAL HERNIA REPAIR WITH MESH;  Surgeon: Clovis Riley, MD;  Location: [facility];  Service: General;  Laterality: Right;   [place] N/A 07/27/2020   Procedure: LEFT ATRIAL APPENDAGE OCCLUSION;  Surgeon: Sherren Mocha, MD;  Location: [facility];  Service: Cardiovascular;  Laterality: N/A;   SHOULDER SURGERY Left    [place] WITHOUT CARDIOVERSION N/A 07/27/2020   Procedure: TRANSESOPHAGEAL ECHOCARDIOGRAM (TEE);  Surgeon: Sherren Mocha, MD;  Location: [facility];  Service: Cardiovascular;  Laterality: N/A;   [place] WITHOUT CARDIOVERSION N/A 09/13/2020   Procedure: TRANSESOPHAGEAL ECHOCARDIOGRAM (TEE);  Surgeon: Sanda Klein, MD;  Location[facility];  Service: Cardiovascular;  Laterality: N/A;   TONSILLECTOMY     1945  There were no vitals filed for this visit.   Subjective Assessment - 01/24/21 1125     Subjective Doing well, feels he is resdy for I management    Pertinent History HTN, cerebellar ataxia, oropharyngeal dysphagia, Sialorrhea, Chronic L ankle pain    Limitations Walking;Other (comment)    How long can you sit comfortably? no issues    How long can you stand comfortably? no issues    How long can you walk comfortably? 1/4 mile    Diagnostic tests 11/05/19: IMPRESSION:1. 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 Moderate chronic small vessel ischemic  disease.3. Motion degraded head MRA without evidence of a large or mediumvessel occlusion or significant proximal stenosis.4. Negative neck MRA.  IMPRESSION:1. 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 Mild spinal stenosis and mild-to-moderate neural foraminalstenosis at C6-7, stable to minimally progressed.    Patient Stated Goals walk 1 mile comfortably, negotiate stairs better    Pain Onset More than a month ago                [facility] PT Assessment - 01/24/21 0001       Functional Gait  Assessment   Gait assessed  Yes    Gait Level Surface Walks 20 ft, slow speed, abnormal gait pattern, evidence for imbalance or deviates 10-15 in outside of the 12 in walkway width. Requires more than 7 sec to ambulate 20 ft.    Change in Gait Speed Able to smoothly change walking speed without loss of balance or gait deviation. Deviate no more than 6 in outside of the 12 in walkway width.    Gait with Horizontal Head Turns Performs head turns smoothly with no change in gait. Deviates no more than 6 in outside 12 in walkway width    Gait with Vertical Head Turns Performs head turns with no change in gait. Deviates no more than 6 in outside 12 in walkway width.    Gait and Pivot Turn Turns slowly, requires verbal cueing, or requires several small steps to catch balance following turn and stop    Step Over Obstacle Is able to step over 2 stacked shoe boxes taped together (9 in total height) without changing gait speed. No evidence of imbalance.    Gait with Narrow Base of Support Is able to ambulate for 10 steps heel to toe with no staggering.    Gait with Eyes Closed Walks 20 ft, slow speed, abnormal gait pattern, evidence for imbalance, deviates 10-15 in outside 12 in walkway width. Requires more than 9 sec to ambulate 20 ft.    Ambulating Backwards Walks 20 ft, uses assistive device, slower speed,  mild gait deviations, deviates 6-10 in outside 12 in walkway width.    Steps Alternating feet, must use rail.    Total Score 22                                 Upper Extremity Functional Index Score :   /80   PT Education - 01/24/21 1126     Education Details Encouraged pacing at home and with mobility              PT Short Term Goals - 12/25/20 1140       PT SHORT TERM GOAL #1   Title Patient to demo initial HEP back to PT    Baseline  TBD; 11/13/20 Initial HEP established, pt reports    Time 4    Period Weeks    Status Achieved    Target Date 11/27/20      PT SHORT TERM GOAL #2   Title Patient will be able to ambulate 557f across outdoor surface with LRAD under S    Baseline 115 ft with cane and SBA; 11/13/20 10060fambulation with SPC and CGA across paved, sloped and grassy surfaces; 555 ft with st. cane and SBA on level ground    Time 4    Period Weeks    Status Achieved    Target Date 11/27/20      PT SHORT TERM GOAL #3   Title Patient to negotiate steps with single UE and cane with reciprocal pattern    Baseline B UEs on rail w/o cane 4 steps; 11/27/20 Able to negotiate 16 steps with alt pattern using SPC and single rail    Time 4    Period Weeks    Status Achieved    Target Date 11/27/20      PT SHORT TERM GOAL #4   Title Patient to perform 5x STS in <11s w/o UE support w/o bracing knees against seat    Baseline 5x STS in 12.78s w/o UE assist while bracing knees against seat; 11/27/20 Able to complete in 9.4s but braced knees at times 11/30/20 5x STS 11/.78s following Scifit; 15 sec without bracking knees and without UE (12/07/20); 12/11/20 11.7s    Time 4    Period Weeks    Status Achieved    Target Date 11/27/20               PT Long Term Goals - 01/24/21 1104       PT LONG TERM GOAL #1   Title Patient to perform TUG test in <13s with and w/o AD. (all LTGs due 12/25/20)    Baseline Best Tug time 16.0s using cane; 16 sec no AD  (12/07/20); 12/25/20 TUG w/AD 13.85, w/o AD 14.59s; 01/24/21 TUG w/o AD in 10.9s    Time 8    Period Weeks    Status Achieved      PT LONG TERM GOAL #2   Title Patient to ambulate 100060fith LRAD across outdoor surfaces    Baseline 115f68fth SPC and SBA indoor surfaces; 555 feet with st. cane and SBA on level ground (12/07/20); 1200 ft outdoors with suprevision on paved surface, CGA and walking stick on grass    Time 8    Period Weeks    Status Achieved      PT LONG TERM GOAL #3   Title Patient to ambulate at 0.8 m/s with SPC [facility] velocity 0.48 m/s, 11/30/20 Gait velocity 0.34m/47m.21m/s67m21/22); 12/25/20 Gait velocity 0.57 m/s; 01/24/21 Gait speed 0.65 m/s    Time 8    Status Not Met      PT LONG TERM GOAL #4   Title Assess progress towards BERG; 11/13/20 BERG goal is 52    Baseline 11/02/20: baseline score of 50/56; 53/56    Period Weeks    Status Achieved      PT LONG TERM GOAL #5   Title Assess progress towards FGA/DGI; 11/13/20 FGA goal is 20; 12/25/20 New goal is 22    Baseline 11/02/20: baseline score of 11/30; 12/25/20 FGA score is 21; 01/24/21 FGA score 22, goal met    Period Weeks    Status Achieved  Plan - 01/24/21 1126     Clinical Impression Statement Patient has met all goals with the exception of gait velocity scoring in the limited community ambulator range.  He feels confident to transition to independent management.  FGA score meets threshold for fall risk.    Personal Factors and Comorbidities Age;Comorbidity 2;Past/Current Experience;Time since onset of injury/illness/exacerbation    Comorbidities HTN, cerebellar ataxia, oropharyngeal dysphagia, Sialorrhea, Chronic R ankle pain    Examination-Activity Limitations Carry;Lift;Squat;Stairs;Stand;Transfers    Examination-Participation Restrictions Community Activity;Laundry;Shop;Yard Work    [employer] Evolving/Moderate complexity    Rehab Potential Good     PT Frequency 2x / week    PT Duration 8 weeks    PT Treatment/Interventions Patient/family education;ADLs/Self Care Home Management;Aquatic Therapy;DME Instruction;Gait training;Stair training;Functional mobility training;Therapeutic activities;Therapeutic exercise;Balance training;Neuromuscular re-education    PT Next Visit Plan Assess LTGs for DC    PT Home Exercise Plan [id number](from previous episode)    Consulted and Agree with Plan of Care Patient             Patient will benefit from skilled therapeutic intervention in order to improve the following deficits and impairments:  Abnormal gait, Decreased balance, Decreased activity tolerance, Decreased endurance, Decreased coordination, Decreased mobility, Decreased range of motion, Decreased strength, Difficulty walking, Postural dysfunction, Pain  Visit Diagnosis: Unsteadiness on feet  Muscle weakness (generalized)  Ataxia     Problem List Patient Active Problem List   Diagnosis Date Noted   Persistent atrial fibrillation ([place]) 08/28/2020   Secondary hypercoagulable state ([place]) 07/20/2020   Paroxysmal atrial fibrillation ([place]) 06/19/2020   TIA (transient ischemic attack) 11/05/2019   Essential hypertension 11/05/2019   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 [place], [place],  [address] Phone: [phone]   Fax:  [phone]  Name: BRANDIS MATSUURA MRN: [id number] Date of Birth: [birth year]

## 2021-01-25 ENCOUNTER — Telehealth: Payer: Self-pay

## 2021-01-25 MED ORDER — ASPIRIN EC 81 MG PO TBEC: 81.0000 mg | DELAYED_RELEASE_TABLET | Freq: Every day | ORAL | 3 refills | Status: AC

## 2021-01-25 NOTE — Telephone Encounter (Signed)
Per Dr. Mardene Speak 01/16/2021 office note,  "3. Presence of Watchman left atrial appendage closure device Doing well after watchman implant.  Continue Plavix 75 mg by mouth once daily for now.  At the 85-monthmark following implant, plan to transition to aspirin 81 mg by mouth once daily monotherapy."  The patient underwent Watchman implantation 07/27/2020. Instructed the patient to STOP PLAVIX 01/27/2021 and START ASPIRIN 81 mg daily. He and his wife were grateful for call and agree with plan.  Medication list updated. Plavix and Eliquis (which was still in list but confirmed with patient he is not taking) removed and Aspirin 81 mg daily added.

## 2021-02-13 ENCOUNTER — Telehealth: Payer: Self-pay | Admitting: *Deleted

## 2021-02-13 NOTE — Telephone Encounter (Signed)
Gave completed/signed attending physician statement back to medical records to process for pt.

## 2021-02-14 ENCOUNTER — Telehealth: Payer: Self-pay | Admitting: *Deleted

## 2021-02-14 NOTE — Telephone Encounter (Signed)
I mailed pt attending Physician statement to home address.

## 2021-02-22 DIAGNOSIS — Z23 Encounter for immunization: Secondary | ICD-10-CM | POA: Diagnosis not present

## 2021-03-01 ENCOUNTER — Other Ambulatory Visit: Payer: Self-pay

## 2021-03-01 NOTE — Patient Outreach (Signed)
[facility]) Care Management  03/01/2021  Derrick White 21-Jan-1941 [id number]  EMMI Join Call. CMA verified patient information earlier per HIPAA compliance and requested call back to speak with spouse.  CMA spoke with spouse this afternoon per patients request. Wife states their are no needs at this time. CMA will mail unsuccessful letter as well as 24 hour nurse line magnet for future reference.  [facility] Management Assistant [phone]

## 2021-03-10 DIAGNOSIS — Z23 Encounter for immunization: Secondary | ICD-10-CM | POA: Diagnosis not present

## 2021-03-13 DIAGNOSIS — M8589 Other specified disorders of bone density and structure, multiple sites: Secondary | ICD-10-CM | POA: Diagnosis not present

## 2021-03-13 DIAGNOSIS — Z125 Encounter for screening for malignant neoplasm of prostate: Secondary | ICD-10-CM | POA: Diagnosis not present

## 2021-03-13 DIAGNOSIS — I10 Essential (primary) hypertension: Secondary | ICD-10-CM | POA: Diagnosis not present

## 2021-03-13 DIAGNOSIS — M859 Disorder of bone density and structure, unspecified: Secondary | ICD-10-CM | POA: Diagnosis not present

## 2021-03-13 DIAGNOSIS — E785 Hyperlipidemia, unspecified: Secondary | ICD-10-CM | POA: Diagnosis not present

## 2021-03-20 DIAGNOSIS — E7800 Pure hypercholesterolemia, unspecified: Secondary | ICD-10-CM | POA: Diagnosis not present

## 2021-03-20 DIAGNOSIS — M5136 Other intervertebral disc degeneration, lumbar region: Secondary | ICD-10-CM | POA: Diagnosis not present

## 2021-03-20 DIAGNOSIS — D126 Benign neoplasm of colon, unspecified: Secondary | ICD-10-CM | POA: Diagnosis not present

## 2021-03-20 DIAGNOSIS — M8580 Other specified disorders of bone density and structure, unspecified site: Secondary | ICD-10-CM | POA: Diagnosis not present

## 2021-03-20 DIAGNOSIS — R1312 Dysphagia, oropharyngeal phase: Secondary | ICD-10-CM | POA: Diagnosis not present

## 2021-03-20 DIAGNOSIS — N401 Enlarged prostate with lower urinary tract symptoms: Secondary | ICD-10-CM | POA: Diagnosis not present

## 2021-03-20 DIAGNOSIS — Z1389 Encounter for screening for other disorder: Secondary | ICD-10-CM | POA: Diagnosis not present

## 2021-03-20 DIAGNOSIS — G3184 Mild cognitive impairment, so stated: Secondary | ICD-10-CM | POA: Diagnosis not present

## 2021-03-20 DIAGNOSIS — Z0000 Encounter for general adult medical examination without abnormal findings: Secondary | ICD-10-CM | POA: Diagnosis not present

## 2021-03-20 DIAGNOSIS — Z1331 Encounter for screening for depression: Secondary | ICD-10-CM | POA: Diagnosis not present

## 2021-03-20 DIAGNOSIS — G9050 Complex regional pain syndrome I, unspecified: Secondary | ICD-10-CM | POA: Diagnosis not present

## 2021-04-06 DIAGNOSIS — C4441 Basal cell carcinoma of skin of scalp and neck: Secondary | ICD-10-CM | POA: Diagnosis not present

## 2021-04-06 DIAGNOSIS — Z85828 Personal history of other malignant neoplasm of skin: Secondary | ICD-10-CM | POA: Diagnosis not present

## 2021-04-06 DIAGNOSIS — D485 Neoplasm of uncertain behavior of skin: Secondary | ICD-10-CM | POA: Diagnosis not present

## 2021-04-06 DIAGNOSIS — L821 Other seborrheic keratosis: Secondary | ICD-10-CM | POA: Diagnosis not present

## 2021-04-06 DIAGNOSIS — L570 Actinic keratosis: Secondary | ICD-10-CM | POA: Diagnosis not present

## 2021-04-06 DIAGNOSIS — C44219 Basal cell carcinoma of skin of left ear and external auricular canal: Secondary | ICD-10-CM | POA: Diagnosis not present

## 2021-04-06 DIAGNOSIS — D225 Melanocytic nevi of trunk: Secondary | ICD-10-CM | POA: Diagnosis not present

## 2021-04-06 DIAGNOSIS — L738 Other specified follicular disorders: Secondary | ICD-10-CM | POA: Diagnosis not present

## 2021-04-10 ENCOUNTER — Other Ambulatory Visit: Payer: Self-pay

## 2021-04-10 ENCOUNTER — Other Ambulatory Visit: Payer: Medicare Other | Admitting: *Deleted

## 2021-04-10 ENCOUNTER — Telehealth: Payer: Self-pay | Admitting: Cardiology

## 2021-04-10 ENCOUNTER — Telehealth: Payer: Self-pay | Admitting: *Deleted

## 2021-04-10 DIAGNOSIS — Z79899 Other long term (current) drug therapy: Secondary | ICD-10-CM

## 2021-04-10 DIAGNOSIS — I4819 Other persistent atrial fibrillation: Secondary | ICD-10-CM

## 2021-04-10 LAB — COMPREHENSIVE METABOLIC PANEL
ALT: 32 IU/L (ref 0–44)
AST: 20 IU/L (ref 0–40)
Albumin/Globulin Ratio: 1.8 (ref 1.2–2.2)
Albumin: 4.8 g/dL — ABNORMAL HIGH (ref 3.7–4.7)
Alkaline Phosphatase: 45 IU/L (ref 44–121)
BUN/Creatinine Ratio: 16 (ref 10–24)
BUN: 22 mg/dL (ref 8–27)
Bilirubin Total: 0.8 mg/dL (ref 0.0–1.2)
CO2: 26 mmol/L (ref 20–29)
Calcium: 9.5 mg/dL (ref 8.6–10.2)
Chloride: 101 mmol/L (ref 96–106)
Creatinine, Ser: 1.39 mg/dL — ABNORMAL HIGH (ref 0.76–1.27)
Globulin, Total: 2.6 g/dL (ref 1.5–4.5)
Glucose: 93 mg/dL (ref 70–99)
Potassium: 5.8 mmol/L (ref 3.5–5.2)
Sodium: 141 mmol/L (ref 134–144)
Total Protein: 7.4 g/dL (ref 6.0–8.5)
eGFR: 51 mL/min/{1.73_m2} — ABNORMAL LOW (ref >59)

## 2021-04-10 LAB — T4, FREE: Free T4: 1.83 ng/dL — ABNORMAL HIGH (ref 0.82–1.77)

## 2021-04-10 LAB — TSH: TSH: 3.4 u[IU]/mL (ref 0.450–4.500)

## 2021-04-10 NOTE — Telephone Encounter (Signed)
Called pt to arrange Amiodarone surveillance blood work per Dr. Quentin Ore (TSH, free T4 & CMP) Pt agreeable to stopping by the office this afternoon for requested blood work.

## 2021-04-10 NOTE — Telephone Encounter (Signed)
Derrick White is calling to report critical lab results.

## 2021-04-10 NOTE — Telephone Encounter (Signed)
Left detailed message for Pt.  Advised to stop telmisartan.  Repeat lab work on Monday.  Will confirm with Pt.

## 2021-04-10 NOTE — Telephone Encounter (Signed)
K+ 5.8  Will forward to Dr Quentin Ore for his f/u

## 2021-04-11 NOTE — Telephone Encounter (Signed)
Confirmed directions with Pt.  Lab work scheduled for Monday.

## 2021-04-16 ENCOUNTER — Other Ambulatory Visit: Payer: Self-pay

## 2021-04-16 ENCOUNTER — Other Ambulatory Visit: Payer: Medicare Other | Admitting: *Deleted

## 2021-04-16 DIAGNOSIS — I4819 Other persistent atrial fibrillation: Secondary | ICD-10-CM

## 2021-04-16 DIAGNOSIS — Z79899 Other long term (current) drug therapy: Secondary | ICD-10-CM | POA: Diagnosis not present

## 2021-04-16 LAB — BASIC METABOLIC PANEL
BUN/Creatinine Ratio: 17 (ref 10–24)
BUN: 18 mg/dL (ref 8–27)
CO2: 24 mmol/L (ref 20–29)
Calcium: 9.2 mg/dL (ref 8.6–10.2)
Chloride: 102 mmol/L (ref 96–106)
Creatinine, Ser: 1.08 mg/dL (ref 0.76–1.27)
Glucose: 94 mg/dL (ref 70–99)
Potassium: 4.3 mmol/L (ref 3.5–5.2)
Sodium: 140 mmol/L (ref 134–144)
eGFR: 69 mL/min/{1.73_m2} (ref >59)

## 2021-05-08 DIAGNOSIS — C44219 Basal cell carcinoma of skin of left ear and external auricular canal: Secondary | ICD-10-CM | POA: Diagnosis not present

## 2021-05-08 DIAGNOSIS — Z85828 Personal history of other malignant neoplasm of skin: Secondary | ICD-10-CM | POA: Diagnosis not present

## 2021-05-28 ENCOUNTER — Telehealth: Payer: Self-pay | Admitting: Cardiology

## 2021-05-28 NOTE — Telephone Encounter (Signed)
I would recomm amlodipine 2.5 mg bid to start .  If tolerates can change to 5 once daily  Follow BP   MyChart response in a few weeks

## 2021-05-28 NOTE — Telephone Encounter (Signed)
Pt c/o BP issue: STAT if pt c/o blurred vision, one-sided weakness or slurred speech  1. What are your last 5 BP readings?  157/93 167/75 162/90 175/82 156/93  2. Are you having any other symptoms (ex. Dizziness, headache, blurred vision, passed out)? no  3. What is your BP issue? Patient's wife called stating that Dr. Quentin Ore had taken him off of his BP medication, but said if his BP started to go back up again that he would put him on a different BP medication.  Wife is calling to get different medication for patient.

## 2021-05-29 MED ORDER — AMLODIPINE BESYLATE 2.5 MG PO TABS: 2.5000 mg | ORAL_TABLET | Freq: Two times a day (BID) | ORAL | 2 refills | Status: DC

## 2021-05-29 NOTE — Telephone Encounter (Signed)
Patient's wife notified.  Prescription sent to [facility] on [place].  She will call or send in readings in about 2 weeks.

## 2021-06-20 ENCOUNTER — Ambulatory Visit (INDEPENDENT_AMBULATORY_CARE_PROVIDER_SITE_OTHER): Payer: Medicare Other | Admitting: Neurology

## 2021-06-20 ENCOUNTER — Encounter: Payer: Self-pay | Admitting: Neurology

## 2021-06-20 VITALS — BP 142/74 | HR 56 | Ht 70.0 in | Wt 179.5 lb

## 2021-06-20 DIAGNOSIS — G119 Hereditary ataxia, unspecified: Secondary | ICD-10-CM

## 2021-06-20 DIAGNOSIS — R4781 Slurred speech: Secondary | ICD-10-CM | POA: Diagnosis not present

## 2021-06-20 DIAGNOSIS — G5732 Lesion of lateral popliteal nerve, left lower limb: Secondary | ICD-10-CM | POA: Diagnosis not present

## 2021-06-20 DIAGNOSIS — R269 Unspecified abnormalities of gait and mobility: Secondary | ICD-10-CM

## 2021-06-20 NOTE — Progress Notes (Signed)
[facility]  PATIENT: Derrick White DOB: Feb 06, 1941  REFERRING DOCTOR OR PCP:  Domenick Gong SOURCE: patient, records in EPIC from [place] and Neurology, MRI results, EMG/NCV results  _________________________________   HISTORICAL  CHIEF COMPLAINT:  Chief Complaint  Patient presents with   Follow-up    Rm 1, w wife. 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 Pt ambulates with cane. Has had a few falls w no major head injuries.     HISTORY OF PRESENT ILLNESS:  Derrick White is a 81 yo man with left ankle and foot pain.    Update  06/20/2021: He is worsening and is quite a bit worse today than last year.   Gait is much more unbalanced even with a cane.  They installed a walk in tub with grab bars.   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  His children are 60, 55 and 5 grandkids and show no symptoms.  He was +/- 81 at onset.     We did the [employer] SCA panel (30+ genes).   It was borderline.  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    Unclear if this is playing a role (SCA type 6 has mutations in this Travontae but usually triplet repeat)  His slurred speech cis also worsneing. He occasionally has some mild difficulties with swallowing..  Some ataxia in hands worsen handwriting.  NCV/EMG study did not show any evidence of motor neuron disease.  One facial muscle had smaller than typical motor units but recruitment was normal and 2 other facial muscles appear normal.  He does not have any difficulties with diplopia or ptosis.  MRI did not show any source of these bulbar symptoms.  The extent of atrophy in the cerebellum was minimal.  Only mild ETOH use in past.  He has not been exposed to Dilantin or antineoplastic agents.  He reports mild foot pain but it is improved.    He has had hemilaminectomy at L5-S1 and also broke his left ankle in the past with a nerve injury.  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  History:   He broke his left ankle in 2012 or 2013 requiring plate and screws.   He later had the hardware removed without benefit.    A year leater, he had surgery to decompress the peroneal nerve.    He was referred to [facility] and had the left peroneal neurectomy and removal of  L3L4 Morton's Neuroma and L2L3 intermetatarsal nerve decompression procedures.    He was tried on Lyrica but felt very off balanced and mentally slowed so he stopped.  He had mild benefit from lidocaine containing ointments. A couple different types of ointments were tried and the composition of all of them is unknown.     Balance issues may have started in 2012 but progressed very mildly .  Muh mlre progression noted between 2022 and 2023 visits.    MRI of the brain 11/05/2019 showed a very small acute stroke in the left temporal operculum.  There was moderate chronic microvascular ischemic change.  MR angiogram that day was normal.  DATA:  NCV/EMG study 05/04/2019 showed the following: 1.   Chronic right C6 radiculopathy.  There could also be milder C5 and C7 chronic radiculopathies. 2.   Chronic left S1 radiculopathy. 3.   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   There was no evidence of polyneuropathy, motor neuron disease or myopathy.  Laboratory data  05/04/2019: CK, acetylcholine receptor antibodies, anti-GM 1 IgG were normal or negative. 09/16/2019: Vitamin B12, ANA, vitamin B1, antigad, RPR were negative or normal.  Celiac panel was borderline.  REVIEW OF SYSTEMS: Constitutional: No fevers, chills, sweats, or change in appetite Eyes: No visual changes, double vision, eye pain Ear, nose and throat: No hearing loss, ear pain, nasal congestion, sore throat Cardiovascular: No chest pain, palpitations Respiratory:  No shortness of breath at rest or with  exertion.   No wheezes GastrointestinaI: No nausea, vomiting, diarrhea, abdominal pain, fecal incontinence.   Has GERD Genitourinary:  No dysuria, urinary retention or frequency.  No nocturia. Musculoskeletal:  No current neck pain, back pain Integumentary: No rash, pruritus, skin lesions Neurological: as above Psychiatric: No depression at this time.  No anxiety Endocrine: No palpitations, diaphoresis, change in appetite, change in weigh or increased thirst Hematologic/Lymphatic:  No anemia, purpura, petechiae. Allergic/Immunologic: No itchy/runny eyes, nasal congestion, recent allergic reactions, rashes  ALLERGIES: Allergies  Allergen Reactions   Morphine Hives and Itching   Codeine Hives and Itching   Propoxyphene Itching    DARVOCET   Shrimp [Shellfish Allergy] Rash    HOME MEDICATIONS:  Current Outpatient Medications:    acetaminophen (TYLENOL) 500 MG tablet, Take 500 mg by mouth every 6 (six) hours as needed for moderate pain or headache., Disp: , Rfl:    amiodarone (PACERONE) 200 MG tablet, Take 1 tablet (200 mg total) by mouth daily., Disp: 90 tablet, Rfl: 3   amLODipine (NORVASC) 2.5 MG tablet, Take 1 tablet (2.5 mg total) by mouth 2 (two) times daily., Disp: 180 tablet, Rfl: 2   aspirin EC 81 MG tablet, Take 1 tablet (81 mg total) by mouth daily. Swallow whole., Disp: 90 tablet, Rfl: 3   atorvastatin (LIPITOR) 40 MG tablet, TAKE 1 TABLET DAILY, Disp: 90 tablet, Rfl: 3   Cholecalciferol 25 MCG (1000 UT) tablet, Take 1,000 Units by mouth at bedtime., Disp: , Rfl:    diphenhydramine-acetaminophen (TYLENOL PM) 25-500 MG TABS tablet, Take 1 tablet by mouth at bedtime as needed (sleep)., Disp: , Rfl:    fluticasone (FLONASE) 50 MCG/ACT nasal spray, Place 1 spray into both nostrils daily as needed for allergies or rhinitis., Disp: , Rfl:    neomycin-polymyxin b-dexamethasone (MAXITROL) 3.5-10000-0.1 SUSP, SMARTSIG:In Eye(s), Disp: , Rfl:    pantoprazole (PROTONIX) 40 MG tablet,  Take 40 mg by mouth in the morning., Disp: , Rfl: 2   Probiotic Product (ALIGN) 4 MG CAPS, Take by mouth in the morning., Disp: , Rfl:    Propylene Glycol (SYSTANE BALANCE) 0.6 % SOLN, Place 1 drop into both eyes 2 (two) times daily as needed (burning)., Disp: , Rfl:    vitamin B-12 (CYANOCOBALAMIN) 1000 MCG tablet, Take 1,000 mcg by mouth at bedtime., Disp: , Rfl:    vitamin E 180 MG (400 UNITS) capsule, Take 400 Units by mouth at bedtime., Disp: , Rfl:   PAST MEDICAL HISTORY: 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   Cerebellar ataxia ([place])    Dysesthesia 06/04/2016   Essential hypertension 11/05/2019   Foot pain, 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   Gait disturbance 03/16/2019   History of cervical spinal surgery 06/04/2016   History of hiatal hernia    History of kidney stones    Hypertension    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 05/04/2019   Memory loss 03/16/2019   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   Pain due to onychomycosis of toenails of both feet 04/28/2019   Pain in left ankle and joints of left foot 08/06/2018   Slurred speech 03/16/2019   Superficial peroneal nerve neuropathy, 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   TIA (transient ischemic attack) 11/05/2019   Worsened handwriting 03/16/2019    PAST SURGICAL HISTORY: See above  FAMILY HISTORY: Family History  Problem Relation Age of Onset   Stroke Neg Hx     SOCIAL HISTORY:  Social History   Socioeconomic History   Marital status: Married    Spouse name: Not on file   Number of children: Not on file   Years of education: Not on file   Highest education level: Not on file  Occupational History   Occupation: retired  Tobacco Use   Smoking status: Former    Packs/day: 1.00    Years: 30.00    Pack years: 30.00    Types: Cigarettes    Quit date: 1995    Years since quitting: 28.1   Smokeless tobacco: Never  Vaping Use   Vaping Use: Never used  Substance and Sexual Activity   Alcohol use: Yes    Alcohol/week: 16.0  standard drinks    Types: 14 Cans of beer, 2 Standard drinks or equivalent per week    Comment: 2 beers a day   Drug use: Never   Sexual activity: Not on file  Other Topics Concern   Not on file  Social History Narrative   Not on file   Social Determinants of Health   Financial Resource Strain: Not on file  Food Insecurity: Not on file  Transportation Needs: Not on file  Physical Activity: Not on file  Stress: Not on file  Social Connections: Not on file  Intimate Partner Violence: Not on file     PHYSICAL EXAM  Vitals:   06/20/21 1106  BP: (!) 142/74  Pulse: (!) 56  SpO2: 96%  Weight: 179 lb 8 oz (81.4 kg)  Height: 5\' 10"  (1.778 m)    Body mass index is 25.76 kg/m.   General: The patient is well-developed and well-nourished and in no acute distress    Skin/Ext: Extremities are without rash or edema.   He has well-healed surgical scars on the left foot.  These are near the sural nerve.    Neurologic Exam  Mental status: He appears to have reduced focus and attention.  Has no trouble following commands.   No aphasia  Cranial nerves: Extraocular movements are full.  Facial strength is normal.  His voice is slurred/ataxic.   Palatal elevation and tongue protrusion are midline.   No obvious hearing deficits are noted.  Motor:  Muscle bulk is normal.   Tone is normal. Strength is  5/5 in legs/feet including EHL   Sensory:   He has mildly reduced sensation in the left foot, in the distribution of the left superficial peroneal nerve.  Mildly reduced vibration sensation in ankles and toes.   Coordination: Cerebellar testing reveals mildly reduced  finger-nose-finger and heel-to-shin bilaterally.  Gait and station: Station is normal.   Gait is ataxic and turn is very unsteady.  He does a lkttle better with cane.  Cannot tandem walk.     Romberg is borderline.   Reflexes: Deep tendon reflexes are symmetric and 3 at knees and 1 at ankles.       DIAGNOSTIC DATA (LABS,  IMAGING, TESTING) -  I reviewed patient records, labs, notes, testing and imaging myself where available.  Lab Results  Component Value Date   WBC 7.0 10/31/2020   HGB 14.8 10/31/2020   HCT 45.7 10/31/2020   MCV 98.5 10/31/2020   PLT 336 10/31/2020      Component Value Date/Time   NA 140 04/16/2021 1142   K 4.3 04/16/2021 1142   CL 102 04/16/2021 1142   CO2 24 04/16/2021 1142   GLUCOSE 94 04/16/2021 1142   GLUCOSE 99 10/31/2020 1119   BUN 18 04/16/2021 1142   CREATININE 1.08 04/16/2021 1142   CALCIUM 9.2 04/16/2021 1142   PROT 7.4 04/10/2021 1105   ALBUMIN 4.8 (H) 04/10/2021 1105   AST 20 04/10/2021 1105   ALT 32 04/10/2021 1105   ALKPHOS 45 04/10/2021 1105   BILITOT 0.8 04/10/2021 1105   GFRNONAA >60 10/31/2020 1119   GFRAA 81 07/12/2020 1309       ASSESSMENT AND PLAN  Cerebellar ataxia (HCC)  Slurred speech  Gait disturbance  Superficial peroneal nerve neuropathy, left   1.   He appears to have progressive cerebellar ataxia (perhaps related to CACNA1a missense mutaiton - though this is not the common mutation).   2.   Stay active and exercise as tolerated.   3.     Advised to take extra care - use grab bars or shower chair in shower.   Avoid uneven surfaces.  Extra care on stairs.   He will return to see me in 12 months or sooner if there are new or worsening neurologic symptoms.Nanine Means. Felecia Shelling, MD, PhD 05/26/9148, 56:97 AM Certified in Neurology, Clinical Neurophysiology, Sleep Medicine, Pain Medicine and Neuroimaging  [facility] [address], [place], [place] [address] [phone]

## 2021-07-03 ENCOUNTER — Telehealth: Payer: Self-pay

## 2021-07-03 NOTE — Telephone Encounter (Signed)
Patients wife called and would like her husband to be seen regarding his shoulder she stated that he cant wait until the 1st of march and would like to be seen the week of the 20th if they can.   Please advise

## 2021-07-09 ENCOUNTER — Ambulatory Visit (INDEPENDENT_AMBULATORY_CARE_PROVIDER_SITE_OTHER): Payer: Medicare Other | Admitting: Physician Assistant

## 2021-07-09 ENCOUNTER — Ambulatory Visit (INDEPENDENT_AMBULATORY_CARE_PROVIDER_SITE_OTHER): Payer: Medicare Other

## 2021-07-09 ENCOUNTER — Encounter: Payer: Self-pay | Admitting: Physician Assistant

## 2021-07-09 ENCOUNTER — Other Ambulatory Visit: Payer: Self-pay

## 2021-07-09 DIAGNOSIS — M25511 Pain in right shoulder: Secondary | ICD-10-CM

## 2021-07-09 MED ORDER — METHYLPREDNISOLONE ACETATE 40 MG/ML IJ SUSP
80.0000 mg | INTRAMUSCULAR | Status: AC | PRN
  Administered 2021-07-09: 80 mg via INTRA_ARTICULAR

## 2021-07-09 MED ORDER — LIDOCAINE HCL 1 % IJ SOLN
5.0000 mL | INTRAMUSCULAR | Status: AC | PRN
  Administered 2021-07-09: 5 mL

## 2021-07-09 NOTE — Progress Notes (Signed)
Office Visit Note   Patient: Derrick White           Date of Birth: Sep 06, 1940           MRN: [id number] Visit Date: 07/09/2021              Requested by: Haywood Pao, MD [address] [place],  [place] [address] PCP: Osborne Casco, Fransico Him, MD  Chief Complaint  Patient presents with   Right Shoulder - Follow-up      HPI: Patient is a pleasant 81 year old gentleman with a history of cerebellar ataxia.  He uses a cane in his right arm.  He said he was reaching for something with his right arm and since then has had pain in his right shoulder.  He denies any paresthesias numbing or tingling.  He denies any previous history of difficulties with his shoulder.  He had an appointment to see Dr. Durward Fortes however was having significant pain and asked to be seen today.  Assessment & Plan: Visit Diagnoses:  1. Right shoulder pain, unspecified chronicity     Plan: AC joint arthrosis with impingement findings.  Discussed this with the patient today we will go forward and try an injection.  He can cancel his appointment with Dr. Durward Fortes this Thursday and scheduled out for 3 weeks.  We will reevaluate at that time.  He does have good strength and does have good motion just is quite painful  Follow-Up Instructions: No follow-ups on file.   Ortho Exam  Patient is alert, oriented, no adenopathy, well-dressed, normal affect, normal respiratory effort. Examination of right shoulder he does have some crepitus with range of motion.  Tender over the [facility] joint.  He is good with internal rotation behind the back but has trouble with extension with regards to pain especially in the mid range.  He has a positive empty can sign.  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  There are sclerotic changes and hypertrophic  bone.  Also some calcific bodies off the humerus with perhaps a little calcific tendinitis.  No acute fractures lungs are well-expanded without any changes  No images are attached to the encounter.  Labs: Lab Results  Component Value Date   HGBA1C 5.6 11/06/2019   REPTSTATUS 04/30/2009 FINAL 04/29/2009   CULT NO GROWTH 04/29/2009     Lab Results  Component Value Date   ALBUMIN 4.8 (H) 04/10/2021   ALBUMIN 4.5 10/10/2020   ALBUMIN 4.1 11/05/2019    No results found for: MG No results found for: VD25OH  No results found for: PREALBUMIN CBC EXTENDED Latest Ref Rng & Units 10/31/2020 09/13/2020 08/28/2020  WBC 4.0 - 10.5 K/uL 7.0 - 7.6  RBC 4.22 - 5.81 MIL/uL 4.64 - 4.51  HGB 13.0 - 17.0 g/dL 14.8 13.9 14.6  HCT 39.0 - 52.0 % 45.7 41.0 44.7  PLT 150 - 400 K/uL 336 - 282  NEUTROABS 1.7 - 7.7 K/uL - - -  LYMPHSABS 0.7 - 4.0 K/uL - - -     There is no height or weight on file to calculate BMI.  Orders:  Orders Placed This Encounter  Procedures   XR Shoulder Right   No orders of the defined types were placed in this encounter.    Procedures: Large Joint Inj: R subacromial bursa on 07/09/2021 1:52 PM Indications: diagnostic  evaluation and pain Details: 25 G 1.5 in needle, posterior approach  Arthrogram: No  Medications: 5 mL lidocaine 1 %; 80 mg methylPREDNISolone acetate 40 MG/ML Outcome: tolerated well, no immediate complications Procedure, treatment alternatives, risks and benefits explained, specific risks discussed. Consent was given by the patient.     Clinical Data: No additional findings.  ROS:  All other systems negative, except as noted in the HPI. Review of Systems  Objective: Vital Signs: There were no vitals taken for this visit.  Specialty Comments:  No specialty comments available.  PMFS History: Patient Active Problem List   Diagnosis Date Noted   Persistent atrial fibrillation ([place]) 08/28/2020   Secondary hypercoagulable state ([place])  07/20/2020   Paroxysmal atrial fibrillation ([place]) 06/19/2020   TIA (transient ischemic attack) 11/05/2019   Essential hypertension 11/05/2019   Cerebellar ataxia ([place]) 11/05/2019   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   C6 radiculopathy 05/04/2019   Pain due to onychomycosis of toenails of both feet 04/28/2019   Worsened handwriting 03/16/2019   Gait disturbance 03/16/2019   Slurred speech 03/16/2019   Memory loss 03/16/2019   Pain in left ankle and joints of left foot 08/06/2018   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   Foot pain, 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   Dysesthesia 06/04/2016   Superficial peroneal nerve neuropathy, 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   History of cervical spinal surgery 06/04/2016   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   Cerebellar ataxia ([place])    Dysesthesia 06/04/2016   Essential hypertension 11/05/2019   Foot pain, 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   Gait disturbance 03/16/2019   History of cervical spinal surgery 06/04/2016   History of hiatal hernia    History of kidney stones    Hypertension    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   Memory loss 03/16/2019   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   Pain due to onychomycosis of toenails of both feet 04/28/2019   Pain in left ankle and joints of left foot 08/06/2018   Slurred speech 03/16/2019   Superficial peroneal nerve neuropathy, 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   TIA (transient ischemic attack) 11/05/2019   Worsened handwriting 03/16/2019    Family History  Problem Relation Age of Onset   Stroke Neg Hx     Past Surgical History:  Procedure Laterality Date   [place] N/A 09/13/2020   Procedure: CARDIOVERSION;  Surgeon: Sanda Klein, MD;  Location: [facility];  Service: Cardiovascular;  Laterality: N/A;   FRACTURE SURGERY     Left ankle, sx x4   INGUINAL HERNIA REPAIR Right 03/05/2019   Procedure: OPEN RIGHT INGUINAL HERNIA REPAIR WITH MESH;  Surgeon:  Clovis Riley, MD;  Location: [facility];  Service: General;  Laterality: Right;   [place] N/A 07/27/2020   Procedure: LEFT ATRIAL APPENDAGE OCCLUSION;  Surgeon: Sherren Mocha, MD;  Location: [facility];  Service: Cardiovascular;  Laterality: N/A;   SHOULDER SURGERY Left    TEE WITHOUT CARDIOVERSION N/A 07/27/2020   Procedure: TRANSESOPHAGEAL ECHOCARDIOGRAM (TEE);  Surgeon: Sherren Mocha, MD;  Location: [facility];  Service: Cardiovascular;  Laterality: N/A;   TEE WITHOUT CARDIOVERSION N/A 09/13/2020   Procedure: TRANSESOPHAGEAL ECHOCARDIOGRAM (TEE);  Surgeon: Sanda Klein, MD;  Location: [facility];  Service: Cardiovascular;  Laterality: N/A;   TONSILLECTOMY     1945   Social History   Occupational History  Occupation: retired  Tobacco Use   Smoking status: Former    Packs/day: 1.00    Years: 30.00    Pack years: 30.00    Types: Cigarettes    Quit date: 1995    Years since quitting: 28.1   Smokeless tobacco: Never  Vaping Use   Vaping Use: Never used  Substance and Sexual Activity   Alcohol use: Yes    Alcohol/week: 16.0 standard drinks    Types: 14 Cans of beer, 2 Standard drinks or equivalent per week    Comment: 2 beers a day   Drug use: Never   Sexual activity: Not on file

## 2021-07-12 ENCOUNTER — Ambulatory Visit: Payer: Medicare Other | Admitting: Orthopaedic Surgery

## 2021-07-31 ENCOUNTER — Encounter: Payer: Self-pay | Admitting: Orthopaedic Surgery

## 2021-07-31 ENCOUNTER — Ambulatory Visit (INDEPENDENT_AMBULATORY_CARE_PROVIDER_SITE_OTHER): Payer: Medicare Other | Admitting: Orthopaedic Surgery

## 2021-07-31 ENCOUNTER — Other Ambulatory Visit: Payer: Self-pay

## 2021-07-31 ENCOUNTER — Ambulatory Visit (INDEPENDENT_AMBULATORY_CARE_PROVIDER_SITE_OTHER): Payer: Medicare Other

## 2021-07-31 DIAGNOSIS — G8929 Other chronic pain: Secondary | ICD-10-CM | POA: Diagnosis not present

## 2021-07-31 DIAGNOSIS — M898X1 Other specified disorders of bone, shoulder: Secondary | ICD-10-CM | POA: Diagnosis not present

## 2021-07-31 DIAGNOSIS — M25511 Pain in right shoulder: Secondary | ICD-10-CM | POA: Diagnosis not present

## 2021-07-31 NOTE — Progress Notes (Signed)
? ?Office Visit Note ?  ?Patient: Derrick White           ?Date of Birth: [birth year]           ?MRN: [id number] ?Visit Date: 07/31/2021 ?             ?Requested by: Derrick Pao, MD ?[address] ?[place],  [place] [address] ?PCP: Derrick Pao, MD ? ? ?Assessment & Plan: ?Visit Diagnoses:  ?1. Pain of right clavicle   ?2. Chronic right shoulder pain   ? ? ?Plan: Derrick White was seen several weeks ago for evaluation of right shoulder pain.  Subacromial cortisone injection was performed with relief of some of his symptoms.  He notes he has had some recurrence but is actually better.  He has had little trouble raising his arm over his head but has had good strength.  He had some some tenderness along the proximal third of the clavicle.  His exam was benign and x-rays were nondiagnostic.  I think is worth trying a course of physical therapy and then rehab and return in a month and if no better we will obtain an MRI scan ? ?Follow-Up Instructions: Return in about 1 month (around 08/31/2021).  ? ?Orders:  ?Orders Placed This Encounter  ?Procedures  ? XR Clavicle Right  ? Ambulatory referral to Physical Therapy  ? ?No orders of the defined types were placed in this encounter. ? ? ? ? Procedures: ?No procedures performed ? ? ?Clinical Data: ?No additional findings. ? ? ?Subjective: ?Chief Complaint  ?Patient presents with  ? Right Shoulder - Follow-up  ?Patient presents today for a three week follow up on his right shoulder. He received a cortisone injection at his last visit. He said that he has noticed overall improvement, but that the injection did not help. He takes Ibuprofen as needed.  ? ?HPI ? ?Review of Systems ? ? ?Objective: ?Vital Signs: There were no vitals taken for this visit. ? ?Physical Exam ?Constitutional:   ?   Appearance: He is well-developed.  ?Pulmonary:  ?   Effort: Pulmonary effort is normal.  ?Skin: ?   General: Skin is warm and dry.  ?Neurological:  ?   Mental Status: He is alert and oriented to  person, place, and time.  ?Psychiatric:     ?   Behavior: Behavior normal.  ? ? ?Ortho Exam awake alert and oriented x3.  Comfortable sitting.  Able to place right arm over his head without crepitation but there was a little hesitancy between about 70 and 100 degrees of flexion.  Mild impingement pain.  Good strength.  No pain over the [facility] joint.  There was some mild tenderness along the proximal third of the clavicle but no increased heat or skin changes.  No masses.  Little bit of discomfort at the sternoclavicular joint but no obvious hypertrophy.  Biceps intact.  Negative Speed sign ? ?Specialty Comments:  ?No specialty comments available. ? ?Imaging: ?XR Clavicle Right ? ?Result Date: 07/31/2021 ?Films of the right clavicle were obtained without evidence of any abnormality.  Most of the pain is localized along the proximal third.  I thought bony architecture was normal.  May be some very mild degenerative changes at the sternoclavicular joint  ? ? ?PMFS History: ?Patient Active Problem List  ? Diagnosis Date Noted  ? Pain in right shoulder 07/31/2021  ? Persistent atrial fibrillation ([place]) 08/28/2020  ? Secondary hypercoagulable state ([place]) 07/20/2020  ? Paroxysmal atrial fibrillation ([place]) 06/19/2020  ?  TIA (transient ischemic attack) 11/05/2019  ? Essential hypertension 11/05/2019  ? 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 C6 radiculopathy 05/04/2019  ? Pain due to onychomycosis of toenails of both feet 04/28/2019  ? Worsened handwriting 03/16/2019  ? Gait disturbance 03/16/2019  ? Slurred speech 03/16/2019  ? Memory loss 03/16/2019  ? Pain in left ankle and joints of left foot 08/06/2018  ? 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  ? Foot pain, 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  ? Dysesthesia 06/04/2016  ? 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 History of cervical spinal surgery 06/04/2016  ? ?Past Medical History:  ?Diagnosis Date  ? C6 radiculopathy 05/04/2019  ?  Cerebellar ataxia ([place])   ? Dysesthesia 06/04/2016  ? Essential hypertension 11/05/2019  ? Foot pain, 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  ? Gait disturbance 03/16/2019  ? History of cervical spinal surgery 06/04/2016  ? History of hiatal hernia   ? History of kidney stones   ? Hypertension   ? 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 Memory loss 03/16/2019  ? 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  ? Pain due to onychomycosis of toenails of both feet 04/28/2019  ? Pain in left ankle and joints of left foot 08/06/2018  ? Slurred speech 03/16/2019  ? 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 TIA (transient ischemic attack) 11/05/2019  ? Worsened handwriting 03/16/2019  ?  ?Family History  ?Problem Relation Age of Onset  ? Stroke Neg Hx   ?  ?Past Surgical History:  ?Procedure Laterality Date  ? APPENDECTOMY    ? 1940s  ? [place]  ? CARDIOVERSION N/A 09/13/2020  ? Procedure: CARDIOVERSION;  Surgeon: Sanda Klein, MD;  [facility];  Service: Cardiovascular;  Laterality: N/A;  ? FRACTURE SURGERY    ? Left ankle, sx x4  ? INGUINAL HERNIA REPAIR Right 03/05/2019  ? Procedure: OPEN RIGHT INGUINAL HERNIA REPAIR WITH MESH;  Surgeon: Clovis Riley, MD;  Location: [facility];  Service: General;  Laterality: Right;  ? [place]  ? LEFT ATRIAL APPENDAGE OCCLUSION N/A 07/27/2020  ? Procedure: LEFT ATRIAL APPENDAGE OCCLUSION;  Surgeon: Sherren Mocha, MD;  Location: [place] [facility];  Service: Cardiovascular;  Laterality: N/A;  ? SHOULDER SURGERY Left   ? TEE WITHOUT CARDIOVERSION N/A 07/27/2020  ? Procedure: TRANSESOPHAGEAL ECHOCARDIOGRAM (TEE);  Surgeon: Sherren Mocha, MD;  Location: [facility];  Service: Cardiovascular;  Laterality: N/A;  ? TEE WITHOUT CARDIOVERSION N/A 09/13/2020  ? Procedure: TRANSESOPHAGEAL ECHOCARDIOGRAM (TEE);  Surgeon: Sanda Klein, MD;  Location: [facility];  Service: Cardiovascular;  Laterality: N/A;  ? TONSILLECTOMY    ? 1945  ? ?Social History   ? ?Occupational History  ? Occupation: retired  ?Tobacco Use  ? Smoking status: Former  ?  Packs/day: 1.00  ?  Years: 30.00  ?  Pack years: 30.00  ?  Types: Cigarettes  ?  Quit date: 1995  ?  Years since quitting: 28.2  ? Smokeless tobacco: Never  ?Vaping Use  ? Vaping Use: Never used  ?Substance and Sexual Activity  ? Alcohol use: Yes  ?  Alcohol/week: 16.0 standard drinks  ?  Types: 14 Cans of beer, 2 Standard drinks or equivalent per week  ?  Comment: 2 beers a day  ? Drug use: Never  ? Sexual activity: Not on file  ? ? ? ? ? ? ?

## 2021-08-02 ENCOUNTER — Telehealth: Payer: Self-pay | Admitting: Neurology

## 2021-08-02 DIAGNOSIS — R269 Unspecified abnormalities of gait and mobility: Secondary | ICD-10-CM

## 2021-08-02 NOTE — Telephone Encounter (Signed)
Pt's wife called wanting to know if the provider can write a Rx for a walker. Please advise. ?

## 2021-08-06 NOTE — Telephone Encounter (Signed)
Called and spoke w/ wife. He was previously using cane. Has gotten more weak since last visit. Requesting rx for walker.  ?He fell the other day. No injuries.  ? ?[facility]  ?[address] [place],  [address] ?Phone: [phone] ? ?Tried calling to get fax#. Phone continued to ring, they do not open until 9am. Will try once open. ?

## 2021-08-06 NOTE — Telephone Encounter (Addendum)
Called again and got fax# [phone]. Faxed completed/signed order, received fax confirmation. ?

## 2021-08-14 NOTE — Therapy (Signed)
?OUTPATIENT PHYSICAL THERAPY SHOULDER EVALUATION ? ? ?Patient Name: Derrick White ?MRN: [id number] ?DOB:[birth year], 81 y.o., male ?Today's Date: 08/15/2021 ? ? PT End of Session - 08/15/21 1332   ? ? Visit Number 1   ? Number of Visits 4   ? Date for PT Re-Evaluation 09/12/21   ? Progress Note Due on Visit 10   ? PT Start Time 0263   ? PT Stop Time 1332   ? PT Time Calculation (min) 27 min   ? Activity Tolerance Patient tolerated treatment well   ? Behavior During Therapy [facility] for tasks assessed/performed   ? ?  ?  ? ?  ? ? ?Past Medical History:  ?Diagnosis Date  ? C6 radiculopathy 05/04/2019  ? Cerebellar ataxia ([place])   ? Dysesthesia 06/04/2016  ? Essential hypertension 11/05/2019  ? Foot pain, 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  ? Gait disturbance 03/16/2019  ? History of cervical spinal surgery 06/04/2016  ? History of hiatal hernia   ? History of kidney stones   ? Hypertension   ? 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 Memory loss 03/16/2019  ? 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  ? Pain due to onychomycosis of toenails of both feet 04/28/2019  ? Pain in left ankle and joints of left foot 08/06/2018  ? Slurred speech 03/16/2019  ? 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 TIA (transient ischemic attack) 11/05/2019  ? Worsened handwriting 03/16/2019  ? ?Past Surgical History:  ?Procedure Laterality Date  ? APPENDECTOMY    ? 1940s  ? [place]  ? CARDIOVERSION N/A 09/13/2020  ? Procedure: CARDIOVERSION;  Surgeon: Sanda Klein, MD;  [facility];  Service: Cardiovascular;  Laterality: N/A;  ? FRACTURE SURGERY    ? Left ankle, sx x4  ? INGUINAL HERNIA REPAIR Right 03/05/2019  ? Procedure: OPEN RIGHT INGUINAL HERNIA REPAIR WITH MESH;  Surgeon: Clovis Riley, MD;  Location: [facility];  Service: General;  Laterality: Right;  ? [place]  ? LEFT ATRIAL APPENDAGE OCCLUSION N/A 07/27/2020  ? Procedure: LEFT ATRIAL APPENDAGE OCCLUSION;  Surgeon: Sherren Mocha, MD;  Location: [place] [facility];  Service: Cardiovascular;  Laterality: N/A;  ? SHOULDER SURGERY Left   ? TEE WITHOUT CARDIOVERSION N/A 07/27/2020  ? Procedure: TRANSESOPHAGEAL ECHOCARDIOGRAM (TEE);  Surgeon: Sherren Mocha, MD;  Location: [facility];  Service: Cardiovascular;  Laterality: N/A;  ? TEE WITHOUT CARDIOVERSION N/A 09/13/2020  ? Procedure: TRANSESOPHAGEAL ECHOCARDIOGRAM (TEE);  Surgeon: Sanda Klein, MD;  Location: [facility];  Service: Cardiovascular;  Laterality: N/A;  ? TONSILLECTOMY    ? 1945  ? ?Patient Active Problem List  ? Diagnosis Date Noted  ? Pain in right shoulder 07/31/2021  ? Persistent atrial fibrillation ([place]) 08/28/2020  ? Secondary hypercoagulable state ([place]) 07/20/2020  ? Paroxysmal atrial fibrillation ([place]) 06/19/2020  ? TIA (transient ischemic attack) 11/05/2019  ? Essential hypertension 11/05/2019  ? 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 C6 radiculopathy 05/04/2019  ? Pain due to onychomycosis of toenails of both feet 04/28/2019  ? Worsened handwriting 03/16/2019  ? Gait disturbance 03/16/2019  ? Slurred speech 03/16/2019  ? Memory loss 03/16/2019  ? Pain in left ankle and joints of left foot 08/06/2018  ? 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  ? Foot pain, left 06/04/2016  ? Dysesthesia 06/04/2016  ? 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 History of cervical spinal surgery 06/04/2016  ? ? ?  PCP: Haywood Pao, MD ? ?REFERRING PROVIDER: Garald Balding, MD ? ?REFERRING DIAG: M89.8X1 (ICD-10-CM) - Pain of right clavicle  ? ?THERAPY DIAG:  ?Stiffness of right shoulder, not elsewhere classified - Plan: PT plan of care cert/re-cert ? ?Acute pain of right shoulder - Plan: PT plan of care cert/re-cert ? ?Muscle weakness (generalized) - Plan: PT plan of care cert/re-cert ? ? ?ONSET DATE: Feb 2023 ? ?SUBJECTIVE:                                                                                                                                                                                      ? ?SUBJECTIVE STATEMENT: ?Pt is an 81 y/o male who presents to OPPT for acute Rt shoulder pain following a near fall in which he caught himself with his Rt arm.  He's reported pain has improved over time since injury.  He denies difficulty with reaching or motion activities. ? ?PERTINENT HISTORY: ?C6 radiculopathy, Cerebellar ataxia, HTN, memory loss, mild cognitive impariment, TIA, Lt peroneal nerve neuropathy, afib ? ?PAIN:  ?Are you having pain? Yes: NPRS scale: 0, up to 8 (lasts only a few seconds)/10 ?Pain location: Rt shoulder ?Pain description: sharp, aching ?Aggravating factors: occasional computer work ?Relieving factors: pillow under arm, settles quickly ? ?PRECAUTIONS: Fall ? ?WEIGHT BEARING RESTRICTIONS No ? ?FALLS:  ?Has patient fallen in last 6 months? Yes. Number of falls 3-4 ? ?LIVING ENVIRONMENT: ?Lives with: lives with their spouse and 20# dog ?Lives in: House/apartment ?Stairs: Yes: External: 5 steps; on left going up ?Has following equipment at home: Single point cane and Walker - 2 wheeled ? ?OCCUPATION: ?Retired from [employer] ? ?PLOF: Independent and Leisure: yardwork, playing golf (hasn't done either in recent time) ? ?PATIENT GOALS decrease pain ? ?OBJECTIVE:  ? ?DIAGNOSTIC FINDINGS:  ?Xrays: advanced arthrosis and joint  ?space loss of the [facility] joint.  There are sclerotic changes and hypertrophic  ?bone.  Also some calcific bodies off the humerus with perhaps a little  ?calcific tendinitis ? ?PATIENT SURVEYS:  ?08/15/21: FOTO deferred today ? ?COGNITION: ? Overall cognitive status: Within functional limits for tasks assessed ?    ?SENSATION: ?WFL ? ?POSTURE: ?Rounded shoulders, forward head ? ?UPPER EXTREMITY ROM:  ? ?Active ROM Right ?08/15/2021 Left ?08/15/2021  ?Shoulder flexion 140 135  ?Shoulder extension    ?Shoulder abduction 120 115  ?Shoulder adduction    ?Shoulder internal rotation T12/L1 T10/11  ?Shoulder external rotation  75 ?(Sitting) 45  ?(Sitting)  ?(Blank rows = not tested) ? ?UPPER EXTREMITY MMT: ? ?MMT Right ?08/15/2021 Left ?08/15/2021  ?Shoulder flexion 4/5 3+/5  ?Shoulder extension    ?Shoulder abduction 3+/5 3/5  ?  Shoulder adduction    ?Shoulder internal rotation 5/5 5/5  ?Shoulder external rotation 5/5 4/5  ?(Blank rows = not tested) ? ?SHOULDER SPECIAL TESTS: ? Impingement tests: Hawkins/Kennedy impingement test: positive  ? Rotator cuff assessment: Empty can test: negative and Full can test: negative ?  ?PALPATION:  ?No significant tenderness or trigger points noted; unable to reproduce pain ?  ?TODAY'S TREATMENT:  ?08/15/21 ? See HEP - performed trial reps with mod cues for technique ? ? ?PATIENT EDUCATION: ?Education details: HEP ?Person educated: Patient ?Education method: Explanation, Demonstration, and Handouts ?Education comprehension: verbalized understanding, returned demonstration, tactile cues required, and needs further education ? ? ?HOME EXERCISE PROGRAM: ?Access Code: [email or web address] ?URL: [email or web address] ?Date: 08/15/2021 ?Prepared by: Faustino Congress ? ?Exercises ?- Standing Shoulder Flexion to 90 Degrees with Dumbbells  - 1 x daily - 7 x weekly - 3 sets - 10 reps ?- Shoulder Abduction with Dumbbells - Thumbs Up  - 1 x daily - 7 x weekly - 3 sets - 10 reps ?- Standing Row with Anchored Resistance  - 1 x daily - 7 x weekly - 3 sets - 10 reps ?- Standing Shoulder External Rotation with Resistance  - 1 x daily - 7 x weekly - 3 sets - 10 reps - 1-2 sec hold ? ?ASSESSMENT: ? ?CLINICAL IMPRESSION: ?Patient is a 81 y.o. male who was seen today for physical therapy evaluation and treatment for Rt shoulder pain.  He reports symptoms have largely improved since initial injury.  Today unable to reproduce pain, and ROM and strength are largely the same as Lt shoulder (although this is limited as well).  Provided HEP and pt will call if additional visits are needed.  Feel he can progress on his own with  HEP.  ? ? ?OBJECTIVE IMPAIRMENTS decreased ROM, decreased strength, postural dysfunction, and pain.  ? ?ACTIVITY LIMITATIONS community activity and computer work .  ? ?PERSONAL FACTORS 3+ comorbidities: C6

## 2021-08-15 ENCOUNTER — Encounter: Payer: Self-pay | Admitting: Physical Therapy

## 2021-08-15 ENCOUNTER — Other Ambulatory Visit: Payer: Self-pay

## 2021-08-15 ENCOUNTER — Ambulatory Visit (INDEPENDENT_AMBULATORY_CARE_PROVIDER_SITE_OTHER): Payer: Medicare Other | Admitting: Physical Therapy

## 2021-08-15 DIAGNOSIS — M6281 Muscle weakness (generalized): Secondary | ICD-10-CM

## 2021-08-15 DIAGNOSIS — M898X1 Other specified disorders of bone, shoulder: Secondary | ICD-10-CM

## 2021-08-15 DIAGNOSIS — M25511 Pain in right shoulder: Secondary | ICD-10-CM

## 2021-08-15 DIAGNOSIS — M25611 Stiffness of right shoulder, not elsewhere classified: Secondary | ICD-10-CM

## 2021-08-20 DIAGNOSIS — H52203 Unspecified astigmatism, bilateral: Secondary | ICD-10-CM | POA: Diagnosis not present

## 2021-08-20 DIAGNOSIS — H2513 Age-related nuclear cataract, bilateral: Secondary | ICD-10-CM | POA: Diagnosis not present

## 2021-08-20 DIAGNOSIS — H25013 Cortical age-related cataract, bilateral: Secondary | ICD-10-CM | POA: Diagnosis not present

## 2021-08-20 DIAGNOSIS — H43813 Vitreous degeneration, bilateral: Secondary | ICD-10-CM | POA: Diagnosis not present

## 2021-08-30 ENCOUNTER — Ambulatory Visit: Payer: Medicare Other | Admitting: Orthopaedic Surgery

## 2021-09-04 ENCOUNTER — Other Ambulatory Visit: Payer: Self-pay | Admitting: Cardiology

## 2021-09-20 NOTE — Therapy (Signed)
[place] [facility] ?[facility] ?[address], [address] ?[place], [place], [address] ?Phone: [phone]   Fax:  [phone] ? ?Patient Details  ?Name: Derrick White ?MRN: [id number] ?Date of Birth: [birth year] ?Referring Provider:  Domenick Gong, MD ? ?Encounter Date: 09/20/2021 ? ? ?SPEECH THERAPY DISCHARGE SUMMARY ? ?Visits from Start of Care: 4 ? ?Current functional level related to goals / functional outcomes: ?Pt did not return after last visit on 12-31-19, for unknown reasons. His therapy goals/plan are below. ? ?SLP Short Term Goals - 12/31/19 1251   ?    ?     ?  SLP SHORT TERM GOAL #1  ?  Title Pt will complete HEP for dysarthria and dysphagia with rare min A over 2 sessions   ?  Time 3   ?  Period Weeks   ?  Status On-going   ?     ?  SLP SHORT TERM GOAL #2  ?  Title Pt will follow swallow precautions (pending MBSS results) with rare min A over 2 sessions   ?  Time 3   ?  Period Weeks   ?  Status On-going   ?     ?  SLP SHORT TERM GOAL #3  ?  Title Pt will say 18/20 personally relevant phrases/sentences with compensatory strategies for dysarthira with 100% intelligibilty with rare min A   ?  Time 3   ?  Period Weeks   ?  Status On-going   ?     ?  SLP SHORT TERM GOAL #4  ?  Title Pt will use compensatory strategies over phone conversation with friends/family and report 2 or less requests for repetition over 2 sessions   ?  Time 3   ?  Period Weeks   ?  Status On-going   ?   ?  ?   ?  ?  ?      ?SLP Long Term Goals - 12/31/19 1251   ?    ?     ?  SLP LONG TERM GOAL #1  ?  Title Pt will be 95% intelligible over 25 minute complex conversation with rare min A over 2 sessions   ?  Time 7   ?  Period Weeks   ?  Status On-going   ?     ?  SLP LONG TERM GOAL #2  ?  Title Pt will report 2 or less requests for repeition on 3 business calls/calls to strangers over 2 sessions.   ?  Time 8   ?  Period Weeks   ?  Status On-going   ?     ?  SLP LONG TERM GOAL #3  ?  Title Pt will follow diet  recommendations and swallow precautions with mod I over 2 sessions   ?  Time 7   ?  Period Weeks   ?  Status On-going   ?     ?  SLP LONG TERM GOAL #4  ?  Title Pt will improve score on Communicative Effectiveness Survey by 2 points (23 is original score)   ?  Time 7   ?  Period Weeks   ?  Status On-going   ?   ?  ?   ?  ?  ?      ?Plan - 12/31/19 1250   ?  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 Derrick White worked on his  dysarthria today and req'd SLP cues to speak using compensations for dysarthira in structured tasks. I recommend skilled ST to maximize intelligibility and safety of swallow.   ?  Speech Therapy Frequency 2x / week   ?  Duration --   8 weeks or 17 visits  ?  ? ?  ?Remaining deficits: ?Unknown. Pt has not been seen since August 2021 ?  ?Education / Equipment: ?Compensations  ? ?Patient agrees to discharge. Patient goals were not met. Patient is being discharged due to not returning since the last visit.. ? ? ? ? ?[name], CCC-SLP ?09/20/2021, 12:14 PM ? ?[facility] ?[facility] ?[address], [address] ?[place], [place], [address] ?Phone: [phone]   Fax:  [phone] ?

## 2021-09-24 ENCOUNTER — Telehealth: Payer: Self-pay | Admitting: Orthopaedic Surgery

## 2021-09-24 DIAGNOSIS — D6869 Other thrombophilia: Secondary | ICD-10-CM | POA: Diagnosis not present

## 2021-09-24 DIAGNOSIS — N401 Enlarged prostate with lower urinary tract symptoms: Secondary | ICD-10-CM | POA: Diagnosis not present

## 2021-09-24 DIAGNOSIS — G3184 Mild cognitive impairment, so stated: Secondary | ICD-10-CM | POA: Diagnosis not present

## 2021-09-24 DIAGNOSIS — M5136 Other intervertebral disc degeneration, lumbar region: Secondary | ICD-10-CM | POA: Diagnosis not present

## 2021-09-24 DIAGNOSIS — G9050 Complex regional pain syndrome I, unspecified: Secondary | ICD-10-CM | POA: Diagnosis not present

## 2021-09-24 DIAGNOSIS — R1312 Dysphagia, oropharyngeal phase: Secondary | ICD-10-CM | POA: Diagnosis not present

## 2021-09-24 DIAGNOSIS — E7800 Pure hypercholesterolemia, unspecified: Secondary | ICD-10-CM | POA: Diagnosis not present

## 2021-09-24 DIAGNOSIS — M8580 Other specified disorders of bone density and structure, unspecified site: Secondary | ICD-10-CM | POA: Diagnosis not present

## 2021-09-24 DIAGNOSIS — Z1331 Encounter for screening for depression: Secondary | ICD-10-CM | POA: Diagnosis not present

## 2021-09-24 DIAGNOSIS — I4819 Other persistent atrial fibrillation: Secondary | ICD-10-CM | POA: Diagnosis not present

## 2021-09-24 DIAGNOSIS — Z1339 Encounter for screening examination for other mental health and behavioral disorders: Secondary | ICD-10-CM | POA: Diagnosis not present

## 2021-09-24 DIAGNOSIS — G5732 Lesion of lateral popliteal nerve, left lower limb: Secondary | ICD-10-CM | POA: Diagnosis not present

## 2021-09-24 NOTE — Telephone Encounter (Signed)
Patient said shoulder is not doing better and would like doctor to put in a order for a MRI like dr said ?

## 2021-09-25 ENCOUNTER — Other Ambulatory Visit: Payer: Self-pay

## 2021-09-25 DIAGNOSIS — M898X1 Other specified disorders of bone, shoulder: Secondary | ICD-10-CM

## 2021-09-25 DIAGNOSIS — G8929 Other chronic pain: Secondary | ICD-10-CM

## 2021-09-25 NOTE — Telephone Encounter (Signed)
MRI right shoulder to include the clavicle

## 2021-09-26 NOTE — Progress Notes (Signed)
?Electrophysiology Office Follow up Visit Note:   ? ?Date:  09/27/2021  ? ?ID:  MICHARL HELMES, DOB 07/27/1940, MRN [id number] ? ?PCP:  Tisovec, Fransico Him, MD  ?[facility] Cardiologist:  Dorris Carnes, MD  ?[place] Electrophysiologist:  Vickie Epley, MD  ? ? ?Interval History:   ? ?Kyndal E Hemmelgarn is a 81 y.o. male who presents for a follow up visit. They were last seen in clinic 01/16/2021. ? ?Since their last appointment, he called the office 05/28/2021 and reported a range of BP readings of 156-175/75-93. His telmisartan was previously discontinued due to abnormal labs. It was recommended he switch to 2.5 mg amlodipine BID. If tolerated this could increase to 5 mg. ? ?Overall, he has been feeling good.  ? ?He remains compliant with aspirin; he does note some bruising on his arms. ? ?He denies any palpitations, chest pain, shortness of breath, or peripheral edema. No lightheadedness, headaches, syncope, orthopnea, or PND. ? ? ?  ? ?Past Medical History:  ?Diagnosis Date  ? C6 radiculopathy 05/04/2019  ? Cerebellar ataxia ([place])   ? Dysesthesia 06/04/2016  ? Essential hypertension 11/05/2019  ? Foot pain, left 06/04/2016  ? Gait disturbance 03/16/2019  ? History of cervical spinal surgery 06/04/2016  ? History of hiatal hernia   ? History of kidney stones   ? Hypertension   ? 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 Memory loss 03/16/2019  ? 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  ? Pain due to onychomycosis of toenails of both feet 04/28/2019  ? Pain in left ankle and joints of left foot 08/06/2018  ? Slurred speech 03/16/2019  ? 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 TIA (transient ischemic attack) 11/05/2019  ? Worsened handwriting 03/16/2019  ? ? ?Past Surgical History:  ?Procedure Laterality Date  ? APPENDECTOMY    ? 1940s  ? [place]  ? CARDIOVERSION N/A 09/13/2020  ? Procedure: CARDIOVERSION;  Surgeon: Sanda Klein, MD;  [facility];  Service:  Cardiovascular;  Laterality: N/A;  ? FRACTURE SURGERY    ? Left ankle, sx x4  ? INGUINAL HERNIA REPAIR Right 03/05/2019  ? Procedure: OPEN RIGHT INGUINAL HERNIA REPAIR WITH MESH;  Surgeon: Clovis Riley, MD;  Location: [facility];  Service: General;  Laterality: Right;  ? [place]  ? LEFT ATRIAL APPENDAGE OCCLUSION N/A 07/27/2020  ? Procedure: LEFT ATRIAL APPENDAGE OCCLUSION;  Surgeon: Sherren Mocha, MD;  Location: [place] [facility];  Service: Cardiovascular;  Laterality: N/A;  ? SHOULDER SURGERY Left   ? TEE WITHOUT CARDIOVERSION N/A 07/27/2020  ? Procedure: TRANSESOPHAGEAL ECHOCARDIOGRAM (TEE);  Surgeon: Sherren Mocha, MD;  Location: [place] [facility];  Service: Cardiovascular;  Laterality: N/A;  ? TEE WITHOUT CARDIOVERSION N/A 09/13/2020  ? Procedure: TRANSESOPHAGEAL ECHOCARDIOGRAM (TEE);  Surgeon: Sanda Klein, MD;  Location: [facility];  Service: Cardiovascular;  Laterality: N/A;  ? TONSILLECTOMY    ? 1945  ? ? ?Current Medications: ?Current Meds  ?Medication Sig  ? acetaminophen (TYLENOL) 500 MG tablet Take 500 mg by mouth every 6 (six) hours as needed for moderate pain or headache.  ? amiodarone (PACERONE) 200 MG tablet Take 1 tablet (200 mg total) by mouth daily.  ? amLODipine (NORVASC) 2.5 MG tablet Take 1 tablet (2.5 mg total) by mouth 2 (two) times daily.  ? aspirin EC 81 MG tablet Take 1 tablet (81 mg total) by mouth daily. Swallow whole.  ? atorvastatin (LIPITOR) 40 MG  tablet TAKE 1 TABLET DAILY  ? Cholecalciferol 25 MCG (1000 UT) tablet Take 1,000 Units by mouth at bedtime.  ? diphenhydramine-acetaminophen (TYLENOL PM) 25-500 MG TABS tablet Take 1 tablet by mouth at bedtime as needed (sleep).  ? fluticasone (FLONASE) 50 MCG/ACT nasal spray Place 1 spray into both nostrils daily as needed for allergies or rhinitis.  ? neomycin-polymyxin b-dexamethasone (MAXITROL) 3.5-10000-0.1 SUSP SMARTSIG:In Eye(s)  ? pantoprazole (PROTONIX) 40 MG tablet Take 40 mg by mouth in the morning.  ?  Probiotic Product (ALIGN) 4 MG CAPS Take by mouth in the morning.  ? Propylene Glycol (SYSTANE BALANCE) 0.6 % SOLN Place 1 drop into both eyes 2 (two) times daily as needed (burning).  ? vitamin B-12 (CYANOCOBALAMIN) 1000 MCG tablet Take 1,000 mcg by mouth at bedtime.  ? vitamin E 180 MG (400 UNITS) capsule Take 400 Units by mouth at bedtime.  ?  ? ?Allergies:   Morphine, Codeine, Propoxyphene, and Shrimp [shellfish allergy]  ? ?Social History  ? ?Socioeconomic History  ? Marital status: Married  ?  Spouse name: Not on file  ? Number of children: Not on file  ? Years of education: Not on file  ? Highest education level: Not on file  ?Occupational History  ? Occupation: retired  ?Tobacco Use  ? Smoking status: Former  ?  Packs/day: 1.00  ?  Years: 30.00  ?  Pack years: 30.00  ?  Types: Cigarettes  ?  Quit date: 1995  ?  Years since quitting: 28.3  ? Smokeless tobacco: Never  ?Vaping Use  ? Vaping Use: Never used  ?Substance and Sexual Activity  ? Alcohol use: Yes  ?  Alcohol/week: 16.0 standard drinks  ?  Types: 14 Cans of beer, 2 Standard drinks or equivalent per week  ?  Comment: 2 beers a day  ? Drug use: Never  ? Sexual activity: Not on file  ?Other Topics Concern  ? Not on file  ?Social History Narrative  ? Not on file  ? ?Social Determinants of Health  ? ?Financial Resource Strain: Not on file  ?Food Insecurity: Not on file  ?Transportation Needs: Not on file  ?Physical Activity: Not on file  ?Stress: Not on file  ?Social Connections: Not on file  ?  ? ?Family History: ?The patient's family history is negative for Stroke. ? ?ROS:   ?Please see the history of present illness.    ?All other systems reviewed and are negative. ? ?EKGs/Labs/Other Studies Reviewed:   ? ?The following studies were reviewed today: ? ?09/13/2020  Echo ? 1. Left ventricular ejection fraction, by estimation, is 60 to 65%. The  ?left ventricle has normal function. The left ventricle has no regional  ?wall motion abnormalities.  ? 2. Right  ventricular systolic function is normal. The right ventricular  ?size is normal.  ? 3. Well seated Watchman device in the left atrial appendage, without  ?leak.. Left atrial size was mildly dilated. No left atrial/left atrial  ?appendage thrombus was detected.  ? 4. The mitral valve is normal in structure. No evidence of mitral valve  ?regurgitation.  ? 5. The aortic valve is tricuspid. Aortic valve regurgitation is trivial.  ?No aortic stenosis is present.  ? 6. Aortic dilatation noted. There is mild dilatation of the aortic root,  ?measuring 39 mm. There is mild (Grade II) plaque involving the descending  ?aorta.  ? 7. Evidence of atrial level shunting detected by color flow Doppler (s/p  ?transseptal puncture). 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 ? ?07/27/2020  Left Atrial Appendage Occlusion ?CONCLUSIONS:  ?1.Successful implantation of a 27 mm WATCHMAN flex left atrial appendage occlusive device    ?2. No early apparent complications.  ? ?07/14/2020  Gated Cardiac CTA ?FINDINGS: ?Calcium Score: 370 involving the LM, proximal, mid LAD and proximal ?mid RCA ?  ?Normal PV anatomy with no anomaly 2 right sided and 2 left sided ?veins ?  ?Mild LAE.  Moderate RAE.  PFO is present ?  ?Large wind-sock LAA Prominent lateral trabeculations. Maximum ?diameter 23 mm and depth 32 mm No LAA thrombus ?  ?TEE 0 degrees: Landing zone diameter 17.7 mm depth 18.7 mm ?  ?TEE 45 degrees: Landing zone diameter 14.8 mm depth 22.3 mm ?  ?TEE 90 degrees: Landing zone diameter 23 mm depth 32 mm ?  ?TEE 135 degrees: Landing zone diameter 21.8 mm depth 29.5 mm ?  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  ?Left: 3.98 cm ?  ?Right: 3.79 cm ?  ?Non: 3.98 cm ?  ?No pericardial effusion ?  ?RAO 29 Caudal 17 degrees working angle ?  ?Given the anterior direction of the LAA a mid posterior trans-septal ?puncture would  be best ?  ?Double curve or anterior curve catheter ?  ?IMPRESSION: ?1.  Calcium Score: 370 this is 38 st percentile for age and sex ?  ?2.  No LAA thrombus ?  ?3. Large Wind-sock appendage with prominent lateral trabe

## 2021-09-27 ENCOUNTER — Ambulatory Visit (INDEPENDENT_AMBULATORY_CARE_PROVIDER_SITE_OTHER): Payer: Medicare Other | Admitting: Cardiology

## 2021-09-27 ENCOUNTER — Encounter: Payer: Self-pay | Admitting: Cardiology

## 2021-09-27 VITALS — BP 120/76 | HR 63 | Ht 70.0 in | Wt 180.0 lb

## 2021-09-27 DIAGNOSIS — Z79899 Other long term (current) drug therapy: Secondary | ICD-10-CM

## 2021-09-27 DIAGNOSIS — I4819 Other persistent atrial fibrillation: Secondary | ICD-10-CM

## 2021-09-27 DIAGNOSIS — Z95818 Presence of other cardiac implants and grafts: Secondary | ICD-10-CM

## 2021-09-27 LAB — COMPREHENSIVE METABOLIC PANEL
ALT: 35 IU/L (ref 0–44)
AST: 20 IU/L (ref 0–40)
Albumin/Globulin Ratio: 1.7 (ref 1.2–2.2)
Albumin: 4.7 g/dL — ABNORMAL HIGH (ref 3.6–4.6)
Alkaline Phosphatase: 40 IU/L — ABNORMAL LOW (ref 44–121)
BUN/Creatinine Ratio: 19 (ref 10–24)
BUN: 22 mg/dL (ref 8–27)
Bilirubin Total: 0.7 mg/dL (ref 0.0–1.2)
CO2: 25 mmol/L (ref 20–29)
Calcium: 10 mg/dL (ref 8.6–10.2)
Chloride: 102 mmol/L (ref 96–106)
Creatinine, Ser: 1.16 mg/dL (ref 0.76–1.27)
Globulin, Total: 2.7 g/dL (ref 1.5–4.5)
Glucose: 87 mg/dL (ref 70–99)
Potassium: 5.3 mmol/L — ABNORMAL HIGH (ref 3.5–5.2)
Sodium: 141 mmol/L (ref 134–144)
Total Protein: 7.4 g/dL (ref 6.0–8.5)
eGFR: 63 mL/min/{1.73_m2} (ref >59)

## 2021-09-27 LAB — T4, FREE: Free T4: 1.98 ng/dL — ABNORMAL HIGH (ref 0.82–1.77)

## 2021-09-27 LAB — TSH: TSH: 2.91 u[IU]/mL (ref 0.450–4.500)

## 2021-09-27 NOTE — Patient Instructions (Addendum)
Medication Instructions:  ?Your physician recommends that you continue on your current medications as directed. Please refer to the Current Medication list given to you today. ?*If you need a refill on your cardiac medications before your next appointment, please call your pharmacy* ? ?Lab Work: ?CMP, TSH, FREE T4 ?If you have labs (blood work) drawn today and your tests are completely normal, you will receive your results only by: ?MyChart Message (if you have MyChart) OR ?A paper copy in the mail ?If you have any lab test that is abnormal or we need to change your treatment, we will call you to review the results. ? ?Testing/Procedures: ?None. ? ?Follow-Up: ?At [facility], you and your health needs are our priority.  As part of our continuing mission to provide you with exceptional heart care, we have created designated Provider Care Teams.  These Care Teams include your primary Cardiologist (physician) and Advanced Practice Providers (APPs -  Physician Assistants and Nurse Practitioners) who all work together to provide you with the care you need, when you need it. ? ?Your physician wants you to follow-up in: 6 months with the Afib Clinic. They will contact you to schedule.  ? ?We recommend signing up for the patient portal called "MyChart".  Sign up information is provided on this After Visit Summary.  MyChart is used to connect with patients for Virtual Visits (Telemedicine).  Patients are able to view lab/test results, encounter notes, upcoming appointments, etc.  Non-urgent messages can be sent to your provider as well.   ?To learn more about what you can do with MyChart, go to [email or web address].   ? ?Any Other Special Instructions Will Be Listed Below (If Applicable). ? ? ? ? ?  ? ? ?

## 2021-10-04 ENCOUNTER — Ambulatory Visit: Payer: Medicare Other | Admitting: Orthopaedic Surgery

## 2021-10-05 ENCOUNTER — Encounter: Payer: Self-pay | Admitting: Internal Medicine

## 2021-10-05 ENCOUNTER — Ambulatory Visit (INDEPENDENT_AMBULATORY_CARE_PROVIDER_SITE_OTHER): Payer: Medicare Other | Admitting: Internal Medicine

## 2021-10-05 VITALS — BP 108/58 | HR 64 | Ht 70.0 in | Wt 182.8 lb

## 2021-10-05 DIAGNOSIS — Z95818 Presence of other cardiac implants and grafts: Secondary | ICD-10-CM | POA: Diagnosis not present

## 2021-10-05 DIAGNOSIS — Z79899 Other long term (current) drug therapy: Secondary | ICD-10-CM

## 2021-10-05 NOTE — Progress Notes (Signed)
Cardiology Office Note   Date:  10/05/2021   ID:  Derrick White, DOB [birth year], MRN [id number]  PCP:  Haywood Pao, MD  Cardiologist:   Dorris Carnes, MD   Pt presents for f/u of paroxysmal afib  History of Present Illness: Derrick White is a 81 y.o. male with a history of hyperlipidemia, hypertension, CV disease, CVA (2021)  Echo showed LVEF 60 to 65%   At time of CVA pt started on ASA and Plavix for 1 month then ASA   I saw him in clinic in AUg 2021   Set up for an event monitor which showed PAF   He denied palpitations   Placed on Eliqui The pt also has cerebellar ataxia    Frequent falls     I saw him in Jan 2022  With the falls concern for use of anticoagulaton   I referred him to Grayce Sessions for [place] placement  He had this done in March 2022   He underwet TEE cardioversion on 09/13/20.  Watchman noted to be well seated   No thrombus noted      Seen in clinic on 5/2  Back in afib.  Started on Toprol XL 25    Pt felt very fatigued   Amiodarone started   and he went back into SR      On Eliquis at time   This was stopped in July 2022   The pt was just seen by Grayce Sessions in May 2023   Since then he says he has been doing good   Denies SOB  NO palpitations  No CP    He remains unsteady on feet   No big falls      Current Meds  Medication Sig   acetaminophen (TYLENOL) 500 MG tablet Take 500 mg by mouth every 6 (six) hours as needed for moderate pain or headache.   amiodarone (PACERONE) 200 MG tablet Take 1 tablet (200 mg total) by mouth daily.   amLODipine (NORVASC) 2.5 MG tablet Take 1 tablet (2.5 mg total) by mouth 2 (two) times daily.   aspirin EC 81 MG tablet Take 1 tablet (81 mg total) by mouth daily. Swallow whole.   atorvastatin (LIPITOR) 40 MG tablet TAKE 1 TABLET DAILY   Cholecalciferol 25 MCG (1000 UT) tablet Take 1,000 Units by mouth at bedtime.   diphenhydramine-acetaminophen (TYLENOL PM) 25-500 MG TABS tablet Take 1 tablet by mouth at bedtime as needed (sleep).    fluticasone (FLONASE) 50 MCG/ACT nasal spray Place 1 spray into both nostrils daily as needed for allergies or rhinitis.   neomycin-polymyxin b-dexamethasone (MAXITROL) 3.5-10000-0.1 SUSP SMARTSIG:In Eye(s)   nystatin (MYCOSTATIN) 100000 UNIT/ML suspension Take 5 mLs by mouth 4 (four) times daily.   pantoprazole (PROTONIX) 40 MG tablet Take 40 mg by mouth in the morning.   Probiotic Product (ALIGN) 4 MG CAPS Take by mouth in the morning.   Propylene Glycol (SYSTANE BALANCE) 0.6 % SOLN Place 1 drop into both eyes 2 (two) times daily as needed (burning).   vitamin B-12 (CYANOCOBALAMIN) 1000 MCG tablet Take 1,000 mcg by mouth at bedtime.   vitamin E 180 MG (400 UNITS) capsule Take 400 Units by mouth at bedtime.     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   Memory loss 03/16/2019   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   Pain due to onychomycosis of toenails of both feet 04/28/2019   Pain in left ankle and joints of left foot 08/06/2018   Slurred speech 03/16/2019   Superficial peroneal nerve neuropathy, 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   TIA (transient ischemic attack) 11/05/2019   Worsened handwriting 03/16/2019    Past Surgical History:  Procedure Laterality Date   [place] N/A 09/13/2020   Procedure: CARDIOVERSION;  Surgeon: Sanda Klein, MD;  Location: [facility];  Service: Cardiovascular;  Laterality: N/A;   FRACTURE SURGERY     Left ankle, sx x4   INGUINAL HERNIA REPAIR Right 03/05/2019   Procedure: OPEN RIGHT INGUINAL  HERNIA REPAIR WITH MESH;  Surgeon: Clovis Riley, MD;  Location: [facility];  Service: General;  Laterality: Right;   [place] N/A 07/27/2020   Procedure: LEFT ATRIAL APPENDAGE OCCLUSION;  Surgeon: Sherren Mocha, MD;  Location: [facility];  Service: Cardiovascular;  Laterality: N/A;   SHOULDER SURGERY Left    TEE WITHOUT CARDIOVERSION N/A 07/27/2020   Procedure: TRANSESOPHAGEAL ECHOCARDIOGRAM (TEE);  Surgeon: Sherren Mocha, MD;  Location: [facility];  Service: Cardiovascular;  Laterality: N/A;   TEE WITHOUT CARDIOVERSION N/A 09/13/2020   Procedure: TRANSESOPHAGEAL ECHOCARDIOGRAM (TEE);  Surgeon: Sanda Klein, MD;  Location: [facility];  Service: Cardiovascular;  Laterality: N/A;   TONSILLECTOMY     1945     Social History:  The patient  reports that he quit smoking about 28 years ago. His smoking use included cigarettes. He has a 30.00 pack-year smoking history. He has never used smokeless tobacco. He reports current alcohol use of about 16.0 standard drinks per week. He reports that he does not use drugs.   Family History:  The patient's family history is not on file.    ROS:  Please see the history of present illness. All other systems are reviewed and  Negative to the above problem except as noted.    PHYSICAL EXAM: VS:  BP (!) 108/58   Pulse 64   Ht '5\' 10"'$  (1.778 m)   Wt 182 lb 12.8 oz (82.9 kg)   SpO2 97%   BMI 26.23 kg/m   GEN: Well nourished, well developed, in no acute distress  HEENT: normal  Neck: no JVD, no carotid bruits Cardiac:  Irreg irreg ; no murmurs. Triv LE edema  Respiratory:  clear to auscultation bilaterally GI: soft, nontender, nondistended, + BS  No hepatomegaly  MS: no deformity Moving all extremities   Skin: warm and dry, no rash Neuro:  Speech staggered  Exam deferred otherwise    EKG:  EKG is not ordered today.    TEE  09/13/20  1. Left ventricular ejection fraction, by  estimation, is 60 to 65%. The left ventricle has normal function. The left ventricle has no regional wall motion abnormalities. 2. Right ventricular systolic function is normal. The right ventricular size is normal. 3. Well seated Watchman device in the left atrial appendage, without leak.. Left atrial size was mildly dilated. No left atrial/left atrial appendage thrombus was detected. 4. The mitral valve  is normal in structure. No evidence of mitral valve regurgitation. 5. The aortic valve is tricuspid. Aortic valve regurgitation is trivial. No aortic stenosis is present. 6. Aortic dilatation noted. There is mild dilatation of the aortic root, measuring 39 mm. There is mild (Grade II) plaque involving the descending aorta. 7. Evidence of atrial level shunting detected by color flow Doppler (s/p transseptal puncture). 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  Lipid Panel    Component Value Date/Time   CHOL 128 01/07/2020 1124   TRIG 107 01/07/2020 1124   HDL 50 01/07/2020 1124   CHOLHDL 2.6 01/07/2020 1124   CHOLHDL 3.2 11/05/2019 0959   VLDL 32 11/05/2019 0959   LDLCALC 58 01/07/2020 1124      Wt Readings from Last 3 Encounters:  10/05/21 182 lb 12.8 oz (82.9 kg)  09/27/21 180 lb (81.6 kg)  06/20/21 179 lb 8 oz (81.4 kg)      ASSESSMENT AND PLAN:  1  Atrial fibrillation.   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  The patient does not have concerns regarding medicines.  Signed, Dorris Carnes, MD  10/05/2021  2:39 PM    [address], [place], [place]  [address] Phone: [phone]; Fax: [phone]

## 2021-10-05 NOTE — Patient Instructions (Signed)
Medication Instructions:   *If you need a refill on your cardiac medications before your next appointment, please call your pharmacy*   Lab Work: NMR  If you have labs (blood work) drawn today and your tests are completely normal, you will receive your results only by: [place] (if you have MyChart) OR A paper copy in the mail If you have any lab test that is abnormal or we need to change your treatment, we will call you to review the results.   Testing/Procedures:    Follow-Up: At [facility], you and your health needs are our priority.  As part of our continuing mission to provide you with exceptional heart care, we have created designated Provider Care Teams.  These Care Teams include your primary Cardiologist (physician) and Advanced Practice Providers (APPs -  Physician Assistants and Nurse Practitioners) who all work together to provide you with the care you need, when you need it.  We recommend signing up for the patient portal called "MyChart".  Sign up information is provided on this After Visit Summary.  MyChart is used to connect with patients for Virtual Visits (Telemedicine).  Patients are able to view lab/test results, encounter notes, upcoming appointments, etc.  Non-urgent messages can be sent to your provider as well.   To learn more about what you can do with MyChart, go to [email or web address].    Your next appointment:   8 month(s)  The format for your next appointment:   In Person  Provider:   Dorris Carnes, MD     Other Instructions   Important Information About Sugar

## 2021-10-08 LAB — NMR, LIPOPROFILE
Cholesterol, Total: 141 mg/dL (ref 100–199)
HDL Particle Number: 39.9 umol/L (ref >=30.5)
HDL-C: 53 mg/dL (ref >39)
LDL Particle Number: 633 nmol/L (ref <1000)
LDL Size: 20.2 nm — ABNORMAL LOW (ref >20.5)
LDL-C (NIH Calc): 54 mg/dL (ref 0–99)
LP-IR Score: 53 — ABNORMAL HIGH (ref <=45)
Small LDL Particle Number: 483 nmol/L (ref <=527)
Triglycerides: 214 mg/dL — ABNORMAL HIGH (ref 0–149)

## 2021-10-09 ENCOUNTER — Ambulatory Visit: Payer: Medicare Other | Admitting: Orthopaedic Surgery

## 2021-10-11 ENCOUNTER — Ambulatory Visit
Admission: RE | Admit: 2021-10-11 | Discharge: 2021-10-11 | Disposition: A | Discharge status: Home / Self Care | Payer: Medicare Other | Source: Ambulatory Visit | Attending: Orthopaedic Surgery | Admitting: Orthopaedic Surgery

## 2021-10-11 DIAGNOSIS — M898X1 Other specified disorders of bone, shoulder: Secondary | ICD-10-CM

## 2021-10-11 DIAGNOSIS — G8929 Other chronic pain: Secondary | ICD-10-CM

## 2021-10-12 DIAGNOSIS — L738 Other specified follicular disorders: Secondary | ICD-10-CM | POA: Diagnosis not present

## 2021-10-12 DIAGNOSIS — Z85828 Personal history of other malignant neoplasm of skin: Secondary | ICD-10-CM | POA: Diagnosis not present

## 2021-10-12 DIAGNOSIS — D692 Other nonthrombocytopenic purpura: Secondary | ICD-10-CM | POA: Diagnosis not present

## 2021-10-12 DIAGNOSIS — L570 Actinic keratosis: Secondary | ICD-10-CM | POA: Diagnosis not present

## 2021-10-16 ENCOUNTER — Encounter: Payer: Self-pay | Admitting: Orthopaedic Surgery

## 2021-10-16 ENCOUNTER — Ambulatory Visit (INDEPENDENT_AMBULATORY_CARE_PROVIDER_SITE_OTHER): Payer: Medicare Other | Admitting: Orthopaedic Surgery

## 2021-10-16 DIAGNOSIS — G8929 Other chronic pain: Secondary | ICD-10-CM

## 2021-10-16 DIAGNOSIS — M25511 Pain in right shoulder: Secondary | ICD-10-CM

## 2021-10-16 NOTE — Progress Notes (Signed)
Office Visit Note   Patient: Derrick White           Date of Birth: 1941-02-17           MRN: [id number] Visit Date: 10/16/2021              Requested by: Haywood Pao, MD [address] [place],  [place] [address] PCP: Haywood Pao, MD   Assessment & Plan: Visit Diagnoses:  1. Chronic right shoulder pain     Plan: Savien has had a chronic problem with pain in the area of his right shoulder and clavicle.  An MRI scan was performed demonstrating areas of tendinosis in the supra and infraspinatus tendons and even in the subscapularis tendon with some partial-thickness tearing.  There were mild to moderate degenerative changes at the [facility] joint and moderate glenohumeral cartilage thinning with some areas of full-thickness loss.  He is also had some pain in the area of the [facility] joint which was not imaged but I suspect he probably has a little arthritis in that area as well to.  Today he did not have any pain or any swelling.  He does have some tenderness along the area of the middle third of the clavicle but I did not feel any masses.  He has had prior cervical spine surgery and does spends some time during the day at a computer that might be partly responsible for his neck pain.  He has not had any numbness or tingling.  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  Have discussed use of Voltaren gel some exercises and may be a cortisone injection to his shoulder.  For the moment he says he can live with it.  He was just happy to know that he did not require any surgery  Follow-Up Instructions: Return if symptoms worsen or fail to improve.   Orders:  No orders of the defined types were placed in this encounter.  No orders of the defined types were placed in this encounter.     Procedures: No procedures performed   Clinical Data: No additional findings.   Subjective: Chief Complaint  Patient presents with    Right Shoulder - Follow-up    MRI review  Patient presents today for follow up on his right shoulder. He had an MRI and is here today for those results.  HPI  Review of Systems   Objective: Vital Signs: There were no vitals taken for this visit.  Physical Exam Constitutional:      Appearance: He is well-developed.  Pulmonary:     Effort: Pulmonary effort is normal.  Skin:    General: Skin is warm and dry.  Neurological:     Mental Status: He is alert and oriented to person, place, and time.  Psychiatric:        Behavior: Behavior normal.    Ortho Exam awake alert and oriented x3.  Comfortable sitting.  Right shoulder with excellent range of motion considering the arthritis of the glenohumeral joint.  He probably lacked 15 to 20 degrees of full overhead motion but it was not uncomfortable.  He did not have significant loss of internal/external rotation.  Good grip and good release and excellent strength.  Negative impingement.  No palpable tenderness at the [facility] joint but he does have a little discomfort along the proximal third of the clavicle and even a little bit at the [facility]  joint.  No localized areas of neck pain.  No masses.  Excellent pulses  Specialty Comments:  No specialty comments available.  Imaging: No results found.   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   C6 radiculopathy 05/04/2019   Pain due to onychomycosis of toenails of both feet 04/28/2019   Worsened handwriting 03/16/2019   Gait disturbance 03/16/2019   Slurred speech 03/16/2019   Memory loss 03/16/2019   Pain in left ankle and  joints of left foot 08/06/2018   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   Foot pain, left 06/04/2016   Dysesthesia 06/04/2016   Superficial peroneal nerve neuropathy, 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   History of cervical spinal surgery 06/04/2016   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   Cerebellar ataxia ([place])    Dysesthesia 06/04/2016   Essential hypertension 11/05/2019   Foot pain, left 06/04/2016   Gait disturbance 03/16/2019   History of cervical spinal surgery 06/04/2016   History of hiatal hernia    History of kidney stones    Hypertension    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   Memory loss 03/16/2019   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   Pain due to onychomycosis of toenails of both feet 04/28/2019   Pain in left ankle and joints of left foot 08/06/2018   Slurred speech 03/16/2019   Superficial peroneal nerve neuropathy, 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   TIA (transient ischemic attack) 11/05/2019   Worsened handwriting 03/16/2019    Family History  Problem Relation Age of Onset   Stroke Neg Hx     Past Surgical History:  Procedure Laterality Date   [place] N/A 09/13/2020   Procedure: CARDIOVERSION;  Surgeon: Sanda Klein, MD;  Location: [place];  Service: Cardiovascular;  Laterality: N/A;   FRACTURE SURGERY     Left ankle, sx x4   INGUINAL HERNIA REPAIR Right 03/05/2019   Procedure: OPEN RIGHT INGUINAL HERNIA REPAIR WITH MESH;  Surgeon: Clovis Riley, MD;  Location: [place];  Service: General;  Laterality: Right;   [place] N/A 07/27/2020   Procedure: LEFT ATRIAL APPENDAGE OCCLUSION;  Surgeon: Sherren Mocha, MD;  Location: [place] [facility];  Service: Cardiovascular;  Laterality: N/A;   SHOULDER SURGERY Left    TE[place] WITHOUT CARDIOVERSION N/A 07/27/2020   Procedure: TRANSESOPHAGEAL ECHOCARDIOGRAM (TEE);  Surgeon: Sherren Mocha, MD;  Location:  [place] [facility];  Service: Cardiovascular;  Laterality: N/A;   TE[place] WITHOUT CARDIOVERSION N/A 09/13/2020   Procedure: TRANSESOPHAGEAL ECHOCARDIOGRAM (TEE);  Surgeon: Sanda Klein, MD;  Location: [facility] ENDOSCOPY;  Service: Cardiovascular;  Laterality: N/A;   TONSILLECTOMY     1945   Social History   Occupational History   Occupation: retired  Tobacco Use   Smoking status: Former    Packs/day: 1.00    Years: 30.00    Pack years: 30.00    Types: Cigarettes    Quit date: 1995    Years since quitting: 28.4   Smokeless tobacco: Never  Vaping Use   Vaping Use: Never used  Substance and Sexual Activity   Alcohol use: Yes    Alcohol/week: 16.0 standard drinks    Types: 14 Cans of beer, 2 Standard drinks or equivalent per week    Comment: 2 beers a day   Drug use: Never   Sexual activity: Not on file

## 2021-10-18 DIAGNOSIS — G119 Hereditary ataxia, unspecified: Secondary | ICD-10-CM | POA: Diagnosis not present

## 2021-10-18 DIAGNOSIS — R071 Chest pain on breathing: Secondary | ICD-10-CM | POA: Diagnosis not present

## 2021-10-18 DIAGNOSIS — I4819 Other persistent atrial fibrillation: Secondary | ICD-10-CM | POA: Diagnosis not present

## 2021-10-18 DIAGNOSIS — D6869 Other thrombophilia: Secondary | ICD-10-CM | POA: Diagnosis not present

## 2021-10-18 DIAGNOSIS — W19XXXA Unspecified fall, initial encounter: Secondary | ICD-10-CM | POA: Diagnosis not present

## 2021-10-18 DIAGNOSIS — Y92009 Unspecified place in unspecified non-institutional (private) residence as the place of occurrence of the external cause: Secondary | ICD-10-CM | POA: Diagnosis not present

## 2021-10-18 DIAGNOSIS — R0781 Pleurodynia: Secondary | ICD-10-CM | POA: Diagnosis not present

## 2021-10-22 IMAGING — MR MR CERVICAL SPINE W/O CM
5 series · 28 of 48 positions shown · non-contrast
Comparison: 01/10/2009

CLINICAL DATA: Ataxia. Bilateral ankle numbness. Prior cervical
fusion.

EXAM:
MRI CERVICAL SPINE WITHOUT CONTRAST
TECHNIQUE: Multiplanar, multisequence MR imaging of the cervical spine was
performed. No intravenous contrast was administered.

[Series 5: T1 · sagittal · 3.0mm · 0.66mm/px · 6 of 13 slices shown]
[im 1/13]
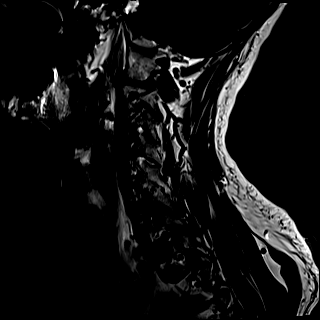
[im 3/13]
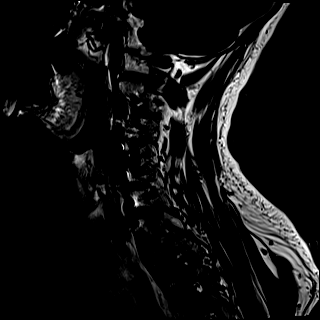
[im 5/13]
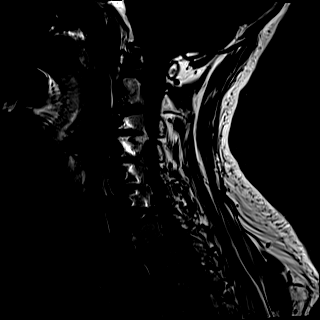
[im 8/13]
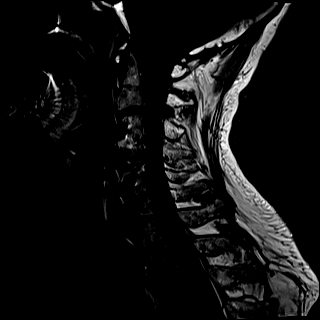
[im 10/13]
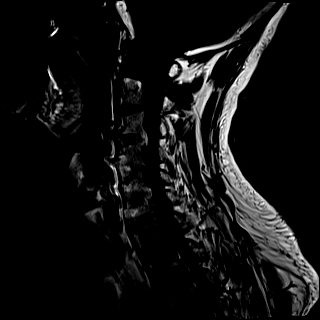
[im 13/13]
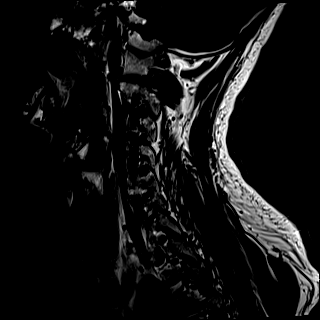

[Series 6: T2 · sagittal · 3.0mm · 0.55mm/px · 6 of 13 slices shown (1 of 2)]
[im 1/13]
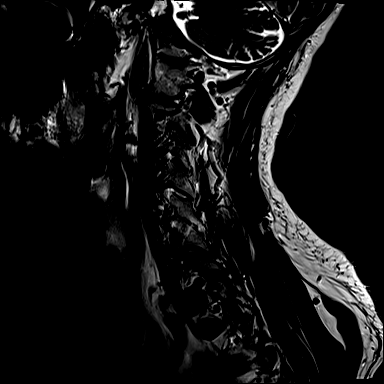
[im 3/13]
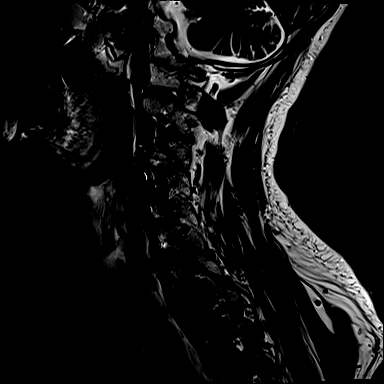
[im 5/13]
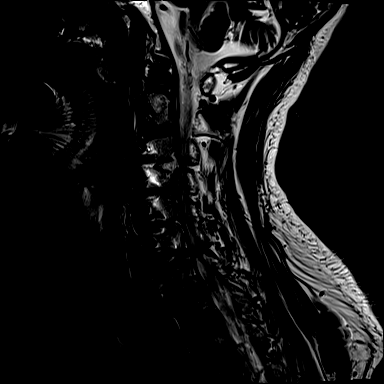
[im 8/13]
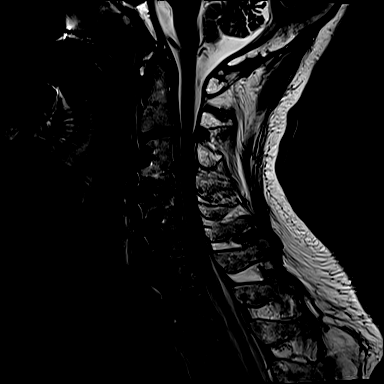
[im 10/13]
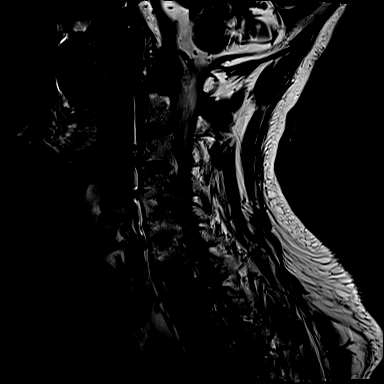
[im 13/13]
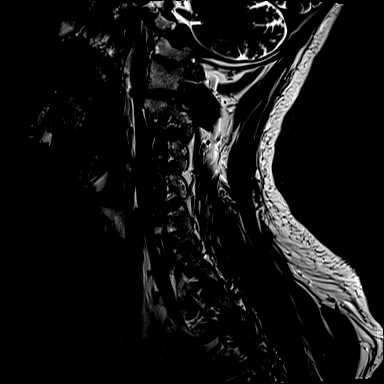

[Series 7: STIR · sagittal · 3.0mm · 0.33mm/px · 6 of 13 slices shown]
[im 1/13]
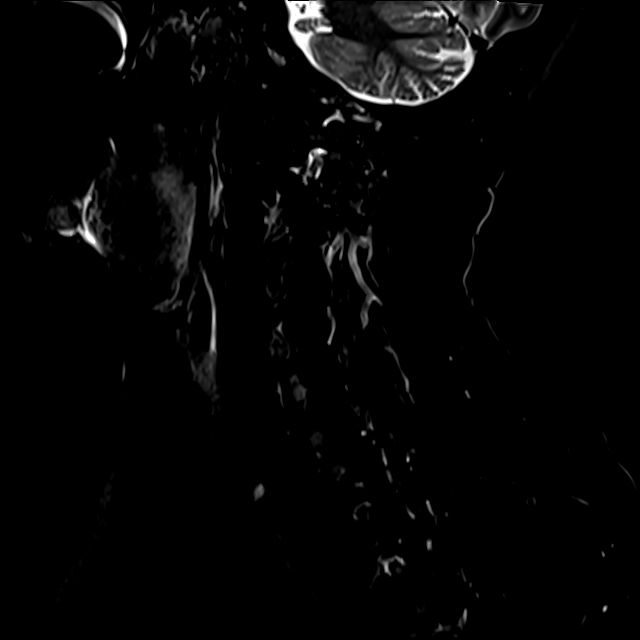
[im 3/13]
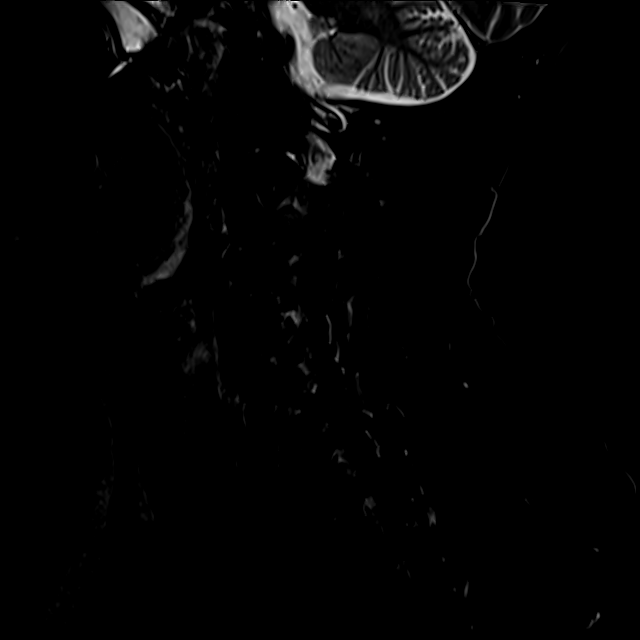
[im 5/13]
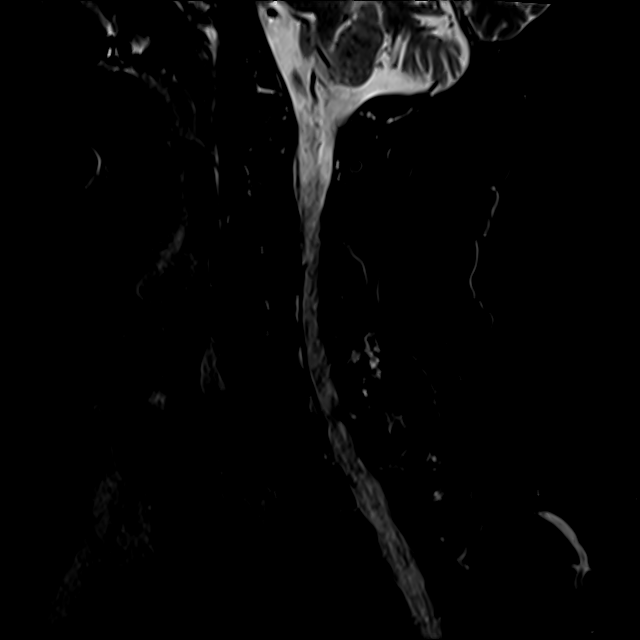
[im 8/13]
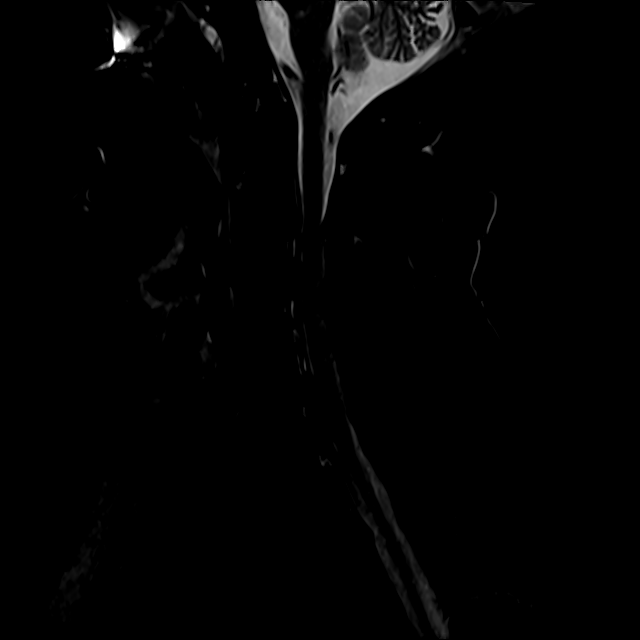
[im 10/13]
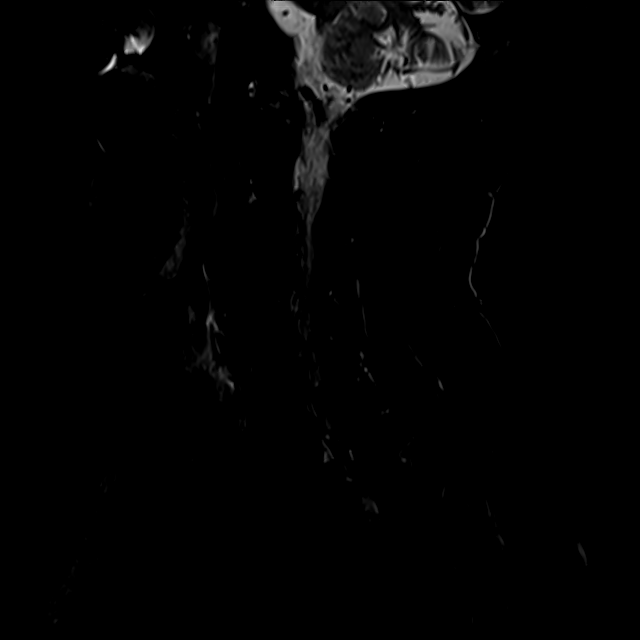
[im 13/13]
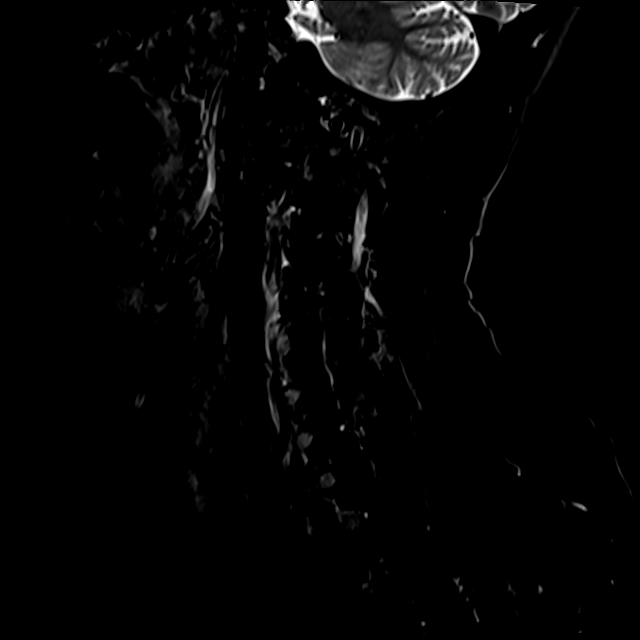

[Series 8: T2 · axial · 3.0mm · 0.50mm/px · z∈[-81,+29]mm · 9 of 30 slices shown (2 of 2)]
[im 1/30]
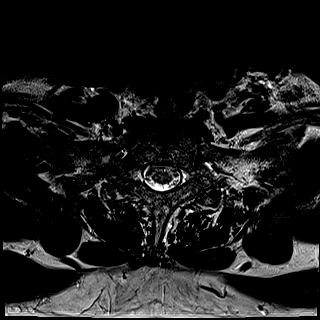
[im 5/30]
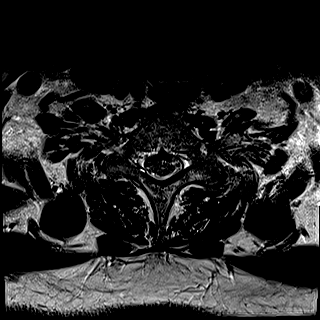
[im 9/30]
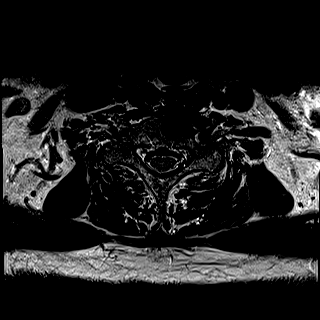
[im 13/30]
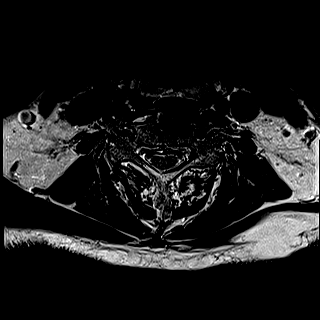
[im 15/30]
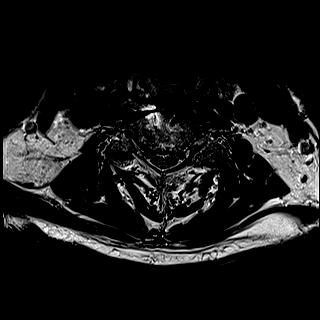
[im 17/30]
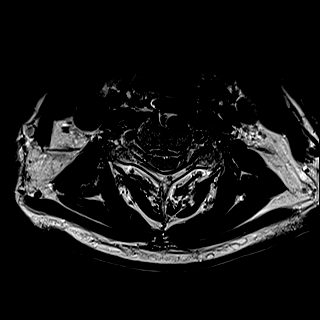
[im 21/30]
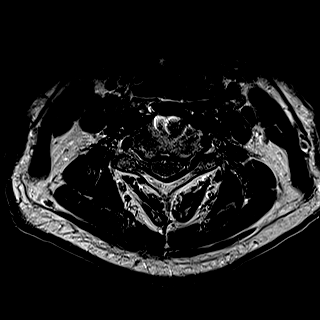
[im 25/30]
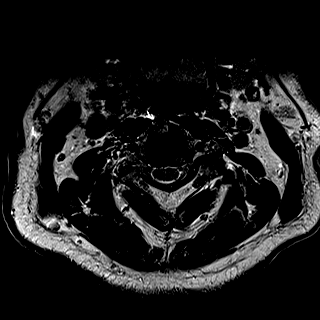
[im 30/30]
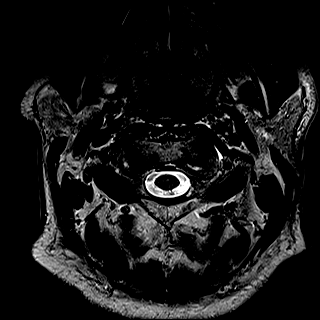

[Series 9: GRE · axial · 3.0mm · 0.42mm/px · 1 of 30 slices shown]
[im 1/30]
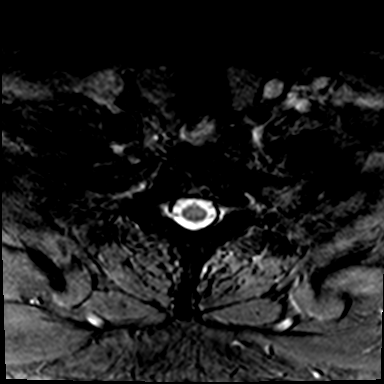

[28 of 48 positions shown; findings below may reference images not displayed]

FINDINGS: Alignment: Normal.

Vertebrae: Interval C3-C6 ACDF with evidence of solid fusion. No
fracture, suspicious osseous lesion, or significant marrow edema.

Cord: Normal signal.

Posterior Fossa, vertebral arteries, paraspinal tissues:
Unremarkable.

Disc levels:

C2-3: Disc bulging, uncovertebral spurring, and moderate left facet
arthrosis result in new moderate spinal stenosis with slight cord
flattening and mild left neural foraminal stenosis.

C3-4: Interval ACDF. Spurring results in mild residual spinal
stenosis and mild right and moderate left neural foraminal stenosis.

C4-5: Interval ACDF. Spurring results in mild residual spinal
stenosis without neural foraminal stenosis.

C5-6: ACDF. Spurring results in moderate right and mild left neural
foraminal stenosis without spinal stenosis.

C6-7: Disc bulging and uncovertebral spurring result in mild spinal
stenosis and mild-to-moderate bilateral neural foraminal stenosis,
stable to minimally progressed.

C7-T1: Minimal disc bulging and uncovertebral spurring without
significant stenosis.
IMPRESSION: 1. Interval C3-C6 ACDF with mild residual spinal stenosis and
mild-to-moderate neural foraminal stenosis as above.
2. New moderate spinal stenosis at C2-3.
3. Mild spinal stenosis and mild-to-moderate neural foraminal
stenosis at C6-7, stable to minimally progressed.

## 2021-11-05 ENCOUNTER — Telehealth: Payer: Self-pay | Admitting: Neurology

## 2021-11-05 NOTE — Telephone Encounter (Signed)
LVM relaying Dr. Garth Bigness message. Asked them to call back if they have any further questions/concerns.

## 2021-11-05 NOTE — Telephone Encounter (Signed)
Pt's wife, Derrick White have found information on a treatment for his condition; nerve stem cell treatment mesenchymal stem cell. Would like a call from the nurse to discuss.

## 2021-12-14 ENCOUNTER — Other Ambulatory Visit: Payer: Self-pay | Admitting: Internal Medicine

## 2022-01-28 ENCOUNTER — Telehealth: Payer: Self-pay | Admitting: Neurology

## 2022-01-28 NOTE — Telephone Encounter (Signed)
Called the patient's wife advised the pt has been seen last in feb and insurance will require office notes within 6 months showing the need for PT and at that point we can place a referral for the pt. Was able to get him worked in for a apt 9/20 at 9:30 am. They accepted that appt for now.

## 2022-01-28 NOTE — Telephone Encounter (Signed)
Pt's wife, Cam Dauphin (on [place]) wanted to know if Dr. Felecia Shelling would order PT for him to help with balance. His Ataxia is getting worse. Would like a call from the nurse.

## 2022-02-06 ENCOUNTER — Encounter: Payer: Self-pay | Admitting: Neurology

## 2022-02-06 ENCOUNTER — Ambulatory Visit (INDEPENDENT_AMBULATORY_CARE_PROVIDER_SITE_OTHER): Payer: Medicare Other | Admitting: Neurology

## 2022-02-06 VITALS — BP 115/56 | HR 58 | Ht 70.0 in | Wt 181.6 lb

## 2022-02-06 DIAGNOSIS — G3184 Mild cognitive impairment, so stated: Secondary | ICD-10-CM

## 2022-02-06 DIAGNOSIS — M79672 Pain in left foot: Secondary | ICD-10-CM | POA: Diagnosis not present

## 2022-02-06 DIAGNOSIS — G119 Hereditary ataxia, unspecified: Secondary | ICD-10-CM | POA: Diagnosis not present

## 2022-02-06 DIAGNOSIS — G5732 Lesion of lateral popliteal nerve, left lower limb: Secondary | ICD-10-CM

## 2022-02-06 DIAGNOSIS — R4781 Slurred speech: Secondary | ICD-10-CM

## 2022-02-06 DIAGNOSIS — R269 Unspecified abnormalities of gait and mobility: Secondary | ICD-10-CM

## 2022-02-06 NOTE — Progress Notes (Signed)
[facility]  PATIENT: Derrick White DOB: 01/12/1941  REFERRING DOCTOR OR PCP:  Domenick Gong SOURCE: patient, records in EPIC from [place] and Neurology, MRI results, EMG/NCV results  _________________________________   HISTORICAL  CHIEF COMPLAINT:  Chief Complaint  Patient presents with   Follow-up    Pt in room #2 with his wife. Pt here today for follow up worsening balance.    HISTORY OF PRESENT ILLNESS:  Derrick White is a 81 yo man with left ankle and foot pain.    Update  02/06/2022: He is a little worse and now uses a rolling walker more than a cane.   He has slurred speech about the same but swallowing seems worse.   Cereal also sometimes gets stuck.    worsening and is quite a bit worse today than last year.     They installed a walk in tub with grab bars.   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  His children are 38, 56 and 5 grandkids and show no symptoms.  He was +/- 72 at onset.     We did the [employer] SCA panel (30+ genes).   It was borderline.  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    Unclear if this is playing a role (SCA type 6 has mutations in this [place] but usually triplet repeat)  He also has ataxia in the hands that affect handwriting and utensils.  NCV/EMG study did not show any evidence of motor neuron disease.  One facial muscle had smaller than typical motor units but recruitment was normal and 2 other facial muscles appear normal.  He does not have any difficulties with diplopia or ptosis.  MRI did not show any source of these bulbar symptoms.  The extent of atrophy in the cerebellum was minimal.  Only mild ETOH use in past.  He has not been exposed to Dilantin or antineoplastic agents.  He reports mild foot pain but it is improved.      History:   He broke his left ankle in 2012 or 2013 requiring plate and screws.   He later had the hardware removed  without benefit.    A year leater, he had surgery to decompress the peroneal nerve.    He was referred to [facility] and had the left peroneal neurectomy and removal of  L3L4 Morton's Neuroma and L2L3 intermetatarsal nerve decompression procedures.    He was tried on Lyrica but felt very off balanced and mentally slowed so he stopped.  He had mild benefit from lidocaine containing ointments. A couple different types of ointments were tried and the composition of all of them is unknown.     Balance issues may have started in 2012 but progressed very mildly .  Muh mlre progression noted between 2022 and 2023 visits.    MRI of the brain 11/05/2019 showed a very small acute stroke in the left temporal operculum.  There was moderate chronic microvascular ischemic change.  MR angiogram that day was normal.  DATA:  NCV/EMG study 05/04/2019 showed the following: 1.   Chronic right C6 radiculopathy.  There could also be milder C5 and C7 chronic radiculopathies. 2.   Chronic left S1 radiculopathy. 3.   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   There was no evidence of polyneuropathy, motor neuron disease or myopathy.  Laboratory data  05/04/2019: CK, acetylcholine receptor antibodies, anti-GM 1 IgG were normal or negative. 09/16/2019: Vitamin B12, ANA, vitamin B1, antigad, RPR were negative or normal.  Celiac panel was borderline.  REVIEW OF SYSTEMS: Constitutional: No fevers, chills, sweats, or change in appetite Eyes: No visual changes, double vision, eye pain Ear, nose and throat: No hearing loss, ear pain, nasal congestion, sore throat Cardiovascular: No chest pain, palpitations Respiratory:  No shortness of breath at rest or with exertion.   No wheezes GastrointestinaI: No nausea, vomiting, diarrhea, abdominal pain, fecal incontinence.   Has GERD Genitourinary:  No dysuria, urinary retention or frequency.  No  nocturia. Musculoskeletal:  No current neck pain, back pain Integumentary: No rash, pruritus, skin lesions Neurological: as above Psychiatric: No depression at this time.  No anxiety Endocrine: No palpitations, diaphoresis, change in appetite, change in weigh or increased thirst Hematologic/Lymphatic:  No anemia, purpura, petechiae. Allergic/Immunologic: No itchy/runny eyes, nasal congestion, recent allergic reactions, rashes  ALLERGIES: Allergies  Allergen Reactions   Morphine Hives and Itching   Codeine Hives and Itching    Other reaction(s): bad feeling   Propoxyphene Itching    DARVOCET   Shrimp [Shellfish Allergy] Rash    HOME MEDICATIONS:  Current Outpatient Medications:    acetaminophen (TYLENOL) 500 MG tablet, Take 500 mg by mouth every 6 (six) hours as needed for moderate pain or headache., Disp: , Rfl:    amiodarone (PACERONE) 200 MG tablet, Take 1 tablet (200 mg total) by mouth daily., Disp: 90 tablet, Rfl: 3   amLODipine (NORVASC) 2.5 MG tablet, Take 1 tablet (2.5 mg total) by mouth 2 (two) times daily., Disp: 180 tablet, Rfl: 2   aspirin EC 81 MG tablet, Take 1 tablet (81 mg total) by mouth daily. Swallow whole., Disp: 90 tablet, Rfl: 3   atorvastatin (LIPITOR) 40 MG tablet, TAKE 1 TABLET DAILY, Disp: 90 tablet, Rfl: 2   Cholecalciferol 25 MCG (1000 UT) tablet, Take 1,000 Units by mouth at bedtime., Disp: , Rfl:    diphenhydramine-acetaminophen (TYLENOL PM) 25-500 MG TABS tablet, Take 1 tablet by mouth at bedtime as needed (sleep)., Disp: , Rfl:    fluticasone (FLONASE) 50 MCG/ACT nasal spray, Place 1 spray into both nostrils daily as needed for allergies or rhinitis., Disp: , Rfl:    neomycin-polymyxin b-dexamethasone (MAXITROL) 3.5-10000-0.1 SUSP, SMARTSIG:In Eye(s), Disp: , Rfl:    nystatin (MYCOSTATIN) 100000 UNIT/ML suspension, Take 5 mLs by mouth 4 (four) times daily., Disp: , Rfl:    pantoprazole (PROTONIX) 40 MG tablet, Take 40 mg by mouth in the morning., Disp:  , Rfl: 2   Probiotic Product (ALIGN) 4 MG CAPS, Take by mouth in the morning., Disp: , Rfl:    Propylene Glycol (SYSTANE BALANCE) 0.6 % SOLN, Place 1 drop into both eyes 2 (two) times daily as needed (burning)., Disp: , Rfl:    vitamin B-12 (CYANOCOBALAMIN) 1000 MCG tablet, Take 1,000 mcg by mouth at bedtime., Disp: , Rfl:    vitamin E 180 MG (400 UNITS) capsule, Take 400 Units by mouth at bedtime., Disp: , Rfl:   PAST MEDICAL HISTORY: 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   Cerebellar ataxia ([place])    Dysesthesia 06/04/2016   Essential hypertension 11/05/2019   Foot pain, left 06/04/2016   Gait disturbance 03/16/2019   History of cervical spinal surgery 06/04/2016   History of hiatal hernia    History of kidney stones    Hypertension    Lumbosacral radiculopathy  at S1 05/04/2019   Memory loss 03/16/2019   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   Pain due to onychomycosis of toenails of both feet 04/28/2019   Pain in left ankle and joints of left foot 08/06/2018   Slurred speech 03/16/2019   Superficial peroneal nerve neuropathy, 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   TIA (transient ischemic attack) 11/05/2019   Worsened handwriting 03/16/2019    PAST SURGICAL HISTORY: See above  FAMILY HISTORY: Family History  Problem Relation Age of Onset   Stroke Neg Hx     SOCIAL HISTORY:  Social History   Socioeconomic History   Marital status: Married    Spouse name: Not on file   Number of children: Not on file   Years of education: Not on file   Highest education level: Not on file  Occupational History   Occupation: retired  Tobacco Use   Smoking status: Former    Packs/day: 1.00    Years: 30.00    Total pack years: 30.00    Types: Cigarettes    Quit date: 1995    Years since quitting: 28.7   Smokeless tobacco: Never  Vaping Use   Vaping Use: Never used  Substance and Sexual Activity   Alcohol use: Yes    Alcohol/week: 16.0 standard drinks of alcohol    Types: 14  Cans of beer, 2 Standard drinks or equivalent per week    Comment: 2 beers a day   Drug use: Never   Sexual activity: Not on file  Other Topics Concern   Not on file  Social History Narrative   Not on file   Social Determinants of Health   Financial Resource Strain: Not on file  Food Insecurity: Not on file  Transportation Needs: Not on file  Physical Activity: Not on file  Stress: Not on file  Social Connections: Not on file  Intimate Partner Violence: Not on file     PHYSICAL EXAM  Vitals:   02/06/22 0935  BP: (!) 115/56  Pulse: (!) 58  Weight: 181 lb 9.6 oz (82.4 kg)  Height: '5\' 10"'$  (1.778 m)    Body mass index is 26.06 kg/m.   General: The patient is well-developed and well-nourished and in no acute distress    Skin/Ext: Extremities are without rash or edema.   He has well-healed surgical scars on the left foot.  These are near the sural nerve.    Neurologic Exam  Mental status: He appears to have reduced focus and attention.  Has no trouble following commands.   No aphasia  Cranial nerves: Extraocular movements are full.  Facial strength is normal.  His voice is slurred/ataxic.   Palatal elevation and tongue protrusion are midline.   No obvious hearing deficits are noted.  Motor:  Muscle bulk is normal.   Tone is normal. Strength is  5/5 in legs/feet including EHL   Sensory:   He has mildly reduced sensation in the left foot, in the distribution of the left superficial peroneal nerve.  Mildly reduced vibration sensation in ankles and toes.   Coordination: Cerebellar testing reveals reduced  finger-nose-finger and poor heel-to-shin bilaterally.  Gait and station: Station is normal.   Gait is ataxic requiring support and turn is very unsteady.  He cannot tandem walk.  [place]berg is difficult as he does poorly with eyes open.   Reflexes: Deep tendon reflexes are symmetric and 3 at knees and 1 at ankles.       DIAGNOSTIC DATA (LABS, IMAGING, TESTING) - I  reviewed  patient records, labs, notes, testing and imaging myself where available.  Lab Results  Component Value Date   WBC 7.0 10/31/2020   HGB 14.8 10/31/2020   HCT 45.7 10/31/2020   MCV 98.5 10/31/2020   PLT 336 10/31/2020      Component Value Date/Time   NA 141 09/27/2021 1050   K 5.3 (H) 09/27/2021 1050   CL 102 09/27/2021 1050   CO2 25 09/27/2021 1050   GLUCOSE 87 09/27/2021 1050   GLUCOSE 99 10/31/2020 1119   BUN 22 09/27/2021 1050   CREATININE 1.16 09/27/2021 1050   CALCIUM 10.0 09/27/2021 1050   PROT 7.4 09/27/2021 1050   ALBUMIN 4.7 (H) 09/27/2021 1050   AST 20 09/27/2021 1050   ALT 35 09/27/2021 1050   ALKPHOS 40 (L) 09/27/2021 1050   BILITOT 0.7 09/27/2021 1050   GFRNONAA >60 10/31/2020 1119   GFRAA 81 07/12/2020 1309       ASSESSMENT AND PLAN  Cerebellar ataxia ([place]) - Plan: Ambulatory referral to Physical Therapy  Gait disturbance - Plan: Ambulatory referral to Physical Therapy  Slurred speech  Foot pain, left  Superficial peroneal nerve neuropathy, left  Mild cognitive impairment   1.   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   2.   Stay active and exercise as tolerated.  Referral to physical therapy for evaluation for gait as he has had more progression lately. 3.     Advised to take extra care - use grab bars or shower chair in shower.   He will return to see me in 12 months or sooner if there are new or worsening neurologic symptoms.Nanine Means. Felecia Shelling, MD, PhD 0/63/0160, 1:09 PM Certified in Neurology, Clinical Neurophysiology, Sleep Medicine, Pain Medicine and Neuroimaging  [facility] [address] [place], [place] [address] [phone]

## 2022-02-08 ENCOUNTER — Ambulatory Visit: Payer: Medicare Other | Attending: Neurology | Admitting: Physical Therapy

## 2022-02-08 DIAGNOSIS — R2681 Unsteadiness on feet: Secondary | ICD-10-CM | POA: Diagnosis not present

## 2022-02-08 DIAGNOSIS — R278 Other lack of coordination: Secondary | ICD-10-CM | POA: Insufficient documentation

## 2022-02-08 DIAGNOSIS — R269 Unspecified abnormalities of gait and mobility: Secondary | ICD-10-CM | POA: Diagnosis not present

## 2022-02-08 DIAGNOSIS — R260 Ataxic gait: Secondary | ICD-10-CM | POA: Insufficient documentation

## 2022-02-08 DIAGNOSIS — G119 Hereditary ataxia, unspecified: Secondary | ICD-10-CM | POA: Insufficient documentation

## 2022-02-08 DIAGNOSIS — Z9181 History of falling: Secondary | ICD-10-CM | POA: Insufficient documentation

## 2022-02-08 NOTE — Therapy (Signed)
OUTPATIENT PHYSICAL THERAPY NEURO EVALUATION   Patient Name: Derrick White MRN: [id number] DOB:04/05/1941, 81 y.o., male 81 male 74 Date: 02/08/2022   PCP: Haywood Pao, MD REFERRING PROVIDER: Britt Bottom, MD    PT End of Session - 02/08/22 1106     Visit Number 1    Number of Visits 13   Plus eval   Date for PT Re-Evaluation 03/29/22    Authorization Type Medicare    Progress Note Due on Visit 10    PT Start Time 1103    PT Stop Time 3244    PT Time Calculation (min) 42 min    Equipment Utilized During Treatment Gait belt    Activity Tolerance Patient tolerated treatment well    Behavior During Therapy WFL for tasks assessed/performed             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   Cerebellar ataxia ([place])    Dysesthesia 06/04/2016   Essential hypertension 11/05/2019   Foot pain, left 06/04/2016   Gait disturbance 03/16/2019   History of cervical spinal surgery 06/04/2016   History of hiatal hernia    History of kidney stones    Hypertension    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   Memory loss 03/16/2019   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   Pain due to onychomycosis of toenails of both feet 04/28/2019   Pain in left ankle and joints of left foot 08/06/2018   Slurred speech 03/16/2019   Superficial peroneal nerve neuropathy, 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   TIA (transient ischemic attack) 11/05/2019   Worsened handwriting 03/16/2019   Past Surgical History:  Procedure Laterality Date   [place] N/A 09/13/2020   Procedure: CARDIOVERSION;  Surgeon: Sanda Klein, MD;  Location: [place];  Service: Cardiovascular;  Laterality: N/A;   FRACTURE SURGERY     Left ankle, sx x4   INGUINAL HERNIA REPAIR Right 03/05/2019   Procedure: OPEN RIGHT INGUINAL HERNIA REPAIR WITH MESH;  Surgeon: Clovis Riley, MD;  Location: [facility];  Service: General;  Laterality: Right;    [place] N/A 07/27/2020   Procedure: LEFT ATRIAL APPENDAGE OCCLUSION;  Surgeon: Sherren Mocha, MD;  Location: [facility];  Service: Cardiovascular;  Laterality: N/A;   SHOULDER SURGERY Left    [place] WITHOUT CARDIOVERSION N/A 07/27/2020   Procedure: TRANSESOPHAGEAL ECHOCARDIOGRAM (TEE);  Surgeon: Sherren Mocha, MD;  Location: [facility];  Service: Cardiovascular;  Laterality: N/A;   TE[place] WITHOUT CARDIOVERSION N/A 09/13/2020   Procedure: TRANSESOPHAGEAL ECHOCARDIOGRAM (TEE);  Surgeon: Sanda Klein, MD;  Location: [facility];  Service: Cardiovascular;  Laterality: N/A;   TONSILLECTOMY     1945   Patient Active Problem List   Diagnosis Date Noted   Pain in right shoulder 07/31/2021   Persistent atrial fibrillation ([place]) 08/28/2020   Secondary hypercoagulable state ([place]) 07/20/2020   Paroxysmal atrial fibrillation ([place]) 06/19/2020   TIA (transient ischemic attack) 11/05/2019   Essential hypertension 11/05/2019   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/05/2019   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   C6 radiculopathy 05/04/2019   Pain due to onychomycosis of toenails of both feet 04/28/2019   Worsened handwriting 03/16/2019   Gait disturbance 03/16/2019   Slurred speech 03/16/2019   Memory loss 03/16/2019   Pain in left ankle and joints of left foot 08/06/2018   Mild  cognitive impairment 12/12/2017   Foot pain, left 06/04/2016   Dysesthesia 06/04/2016   Superficial peroneal nerve neuropathy, 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 "I will not die from ataxia but I will die with it". States he has had multiple falls since last being in PT while walking in his home. He uses a SPC to walk around his home and a rollator in the community. Not having pain.     Pt accompanied by: self   PERTINENT HISTORY:  C6 radiculopathy, Cerebellar ataxia ([place]), Dysesthesia, Essential hypertension, Foot pain (left), Gait disturbance, History of cervical spinal surgery (06/04/2016), History of hiatal hernia, History of kidney stones, 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, Memory loss, Mild cognitive impairment, Pain due to onychomycosis of toenails of both feet, Pain in left ankle and joints of left foot, Slurred speech, Superficial peroneal nerve neuropathy, left (06/04/2009),TIA (transient ischemic attack) 11/05/2019. NCV/EMG study did not show any evidence of motor neuron disease. We did the [employer] SCA panel (30+ genes). It was borderline. 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 Unclear if this is playing a role (SCA type 6 has mutations in this Allan but usually triplet repeat)   PAIN:  Are you having pain? No  PRECAUTIONS: Fall  WEIGHT BEARING RESTRICTIONS No  FALLS: Has patient fallen in last 6 months? Yes. Number of falls around 3, mostly inside his home all in retro direction  LIVING ENVIRONMENT: Lives with: lives with their spouse Lives in: House/apartment Stairs: Yes: Internal: full flight that pt does not use steps; on right going up, on left going up, and can reach both and External: 2 in front and 5 in back steps; can reach both Has following equipment at home: Single point cane, Quad cane small base, Walker - 4 wheeled, shower chair, Grab bars, and power scooter  PLOF: Independent with basic ADLs and Requires  assistive device for independence  PATIENT GOALS "balance"  OBJECTIVE:   DIAGNOSTIC FINDINGS: NCV/EMG study 05/04/2019 showed the following: 1.  Chronic right C6 radiculopathy.  There could also be milder C5 and C7 chronic radiculopathies. 2.   Chronic left S1 radiculopathy. 3.   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   There was no evidence of polyneuropathy, motor neuron disease or myopathy.  COGNITION: Overall cognitive status: Within functional limits for tasks assessed   SENSATION: WFL  COORDINATION: Heel to shin test: Dysmetria bilaterally    POSTURE: rounded shoulders, forward head, increased thoracic kyphosis, and posterior pelvic tilt   LOWER EXTREMITY MMT:  Tested in seated position   MMT Right Eval Left Eval  Hip flexion 5 5  Hip extension    Hip abduction 5 5  Hip adduction 5 5  Hip internal rotation    Hip external rotation    Knee flexion 5 5  Knee extension 5 5  Ankle dorsiflexion 5 4+  Ankle plantarflexion    Ankle inversion    Ankle eversion    (Blank rows = not tested)  BED MOBILITY:  Independent per pt  TRANSFERS: Assistive device utilized: Environmental consultant - 4 wheeled  Sit to stand: SBA Stand to sit: SBA  GAIT: Gait pattern: step through pattern, decreased stride length, decreased hip/knee flexion- Right, decreased hip/knee flexion- Left, ataxic, lateral hip instability, decreased trunk rotation, and narrow BOS Distance walked: Various clinic treatments  Assistive device utilized: Walker - 4 wheeled Level of assistance: SBA Comments: Pt demonstrates lateral gait deviations bilaterally due to decreased pelvic rotation and increased lateral lean. Min cues to slow down as pt is impulsive and loses balance when moving too quickly   FUNCTIONAL TESTs:    [facility] PT Assessment - 02/08/22 1120       Transfers   Five time sit to stand comments  14.6s without UE support, significant  retropulsion w/BLE bracing against chair      Balance   Balance Assessed Yes      Standardized Balance Assessment   Standardized Balance Assessment Timed Up and Go Test;Berg Balance Test;10 meter walk test    10 Meter Walk 32.8' over 13.38s w/rollator = 2.45 ft/s      Berg Balance Test   Sit to Stand Needs minimal aid to stand or to stabilize   Braces against back of mat   Standing Unsupported Able to stand safely 2 minutes    Sitting with Back Unsupported but Feet Supported on Floor or Stool Able to sit safely and securely 2 minutes    Stand to Sit Sits safely with minimal use of hands    Transfers Able to transfer safely, definite need of hands    Standing Unsupported with Eyes Closed Able to stand 10 seconds with supervision    Standing Unsupported with Feet Together Able to place feet together independently and stand 1 minute safely    From Standing, Reach Forward with Outstretched Arm Can reach forward >12 cm safely (5")   8"   From Standing Position, Pick up Object from Floor Able to pick up shoe safely and easily    From Standing Position, Turn to Look Behind Over each Shoulder Turn sideways only but maintains balance    Turn 360 Degrees Able to turn 360 degrees safely but slowly   11.56s to L   Standing Unsupported, Alternately Place Feet on Step/Stool Able to complete >2 steps/needs minimal assist   close S*   Standing Unsupported, One Foot in Front Needs help to step but can hold 15  seconds   16.3s   Standing on One Leg Tries to lift leg/unable to hold 3 seconds but remains standing independently    Total Score 37    Berg comment: significant fall risk      Timed Up and Go Test   Normal TUG (seconds) 18.31   w/ rollator; 25.75s no AD            TODAY'S TREATMENT:  Next Session   PATIENT EDUCATION: Education details: Eval findings, POC  Person educated: Patient Education method: Customer service manager Education comprehension: verbalized  understanding   HOME EXERCISE PROGRAM: To be established     GOALS: Goals reviewed with patient? Yes  SHORT TERM GOALS: Target date: 03/01/2022  Pt will be independent with initial HEP for improved strength, balance, transfers and gait.  Baseline: Not established on eval  Goal status: INITIAL  2.  Pt will improve 5 x STS to less than or equal to 15 seconds without bracing against chair or >2 instances of retropulsion to demonstrate improved functional strength and transfer efficiency.   Baseline: 14.6s without UE support, significant retropulsion and bracing against chair w/BLEs Goal status: INITIAL  3.  Pt will improve Berg score to 41/56 for decreased fall risk  Baseline: 37/56 Goal status: INITIAL  4.  Pt will perform floor transfer and LTG updated as appropriate  Baseline:  Goal status: INITIAL   LONG TERM GOALS: Target date: 03/22/2022  Pt will be independent with final HEP for improved strength, balance, transfers and gait.  Baseline:  Goal status: INITIAL  2.  Pt will improve Berg score to 45/56 for decreased fall risk  Baseline: 37/56 Goal status: INITIAL  3.  Pt will improve normal TUG to less than or equal to 14 seconds w/LRAD for improved functional mobility and decreased fall risk.  Baseline: 18.31s w/rollator  Goal status: INITIAL  4.  Floor transfer goal  Baseline:  Goal status: INITIAL  5.  Pt will improve gait velocity to at least 2.9 ft/s wit LRAD for improved gait efficiency  Baseline: 2.45 ft/s w/LRAD  Goal status: INITIAL  ASSESSMENT:  CLINICAL IMPRESSION: Patient is a 81 year old male referred to Neuro OPPT for cerebellar ataxia.  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 The following deficits were present during the exam: decreased balance, impulsive behavior, decreased coordination and decreased safety awareness. Based on history of falls, progressive nature of his diagnosis and Merrilee Jansky, pt is an incr risk for falls. Pt would benefit from skilled PT to address these impairments and functional limitations to maximize functional mobility independence.     OBJECTIVE IMPAIRMENTS Abnormal gait, decreased balance, decreased coordination, decreased mobility, difficulty walking, and decreased safety awareness.   ACTIVITY LIMITATIONS lifting, standing, stairs, transfers, reach over head, and locomotion level  PARTICIPATION LIMITATIONS: cleaning, laundry, driving, shopping, community activity, and yard work  [place] Age, Fitness, Time since onset of injury/illness/exacerbation, and 1-2 comorbidities: cerebellar ataxia and history of TIA  are also affecting patient's functional outcome.   REHAB POTENTIAL: Good  CLINICAL DECISION MAKING: Evolving/moderate complexity  EVALUATION COMPLEXITY: Moderate  PLAN: PT FREQUENCY: 2x/week  PT DURATION: 6 weeks  PLANNED INTERVENTIONS: Therapeutic exercises, Therapeutic activity, Neuromuscular re-education, Balance training, Gait training, Patient/Family education, Self Care, Stair training, Vestibular training, Canalith repositioning, DME instructions, Aquatic Therapy, Manual therapy, and Re-evaluation  PLAN FOR NEXT SESSION: establish HEP for BLE coordination and balance, maybe try MCTSIB? Plyometrics, safety w/gait (he uses SPC at home), try weighted vest    Cruzita Lederer Deysy Schabel, PT, DPT 02/08/2022, 12:03 PM

## 2022-02-11 ENCOUNTER — Ambulatory Visit: Payer: Medicare Other | Admitting: Physical Therapy

## 2022-02-11 DIAGNOSIS — R278 Other lack of coordination: Secondary | ICD-10-CM

## 2022-02-11 DIAGNOSIS — R269 Unspecified abnormalities of gait and mobility: Secondary | ICD-10-CM | POA: Diagnosis not present

## 2022-02-11 DIAGNOSIS — R260 Ataxic gait: Secondary | ICD-10-CM

## 2022-02-11 DIAGNOSIS — G119 Hereditary ataxia, unspecified: Secondary | ICD-10-CM | POA: Diagnosis not present

## 2022-02-11 DIAGNOSIS — Z9181 History of falling: Secondary | ICD-10-CM | POA: Diagnosis not present

## 2022-02-11 DIAGNOSIS — R2681 Unsteadiness on feet: Secondary | ICD-10-CM | POA: Diagnosis not present

## 2022-02-11 NOTE — Therapy (Signed)
OUTPATIENT PHYSICAL THERAPY NEURO TREATMENT   Patient Name: Derrick White MRN: [id number] DOB:1941-03-10, 81 y.o., male Today's Date: 02/11/2022   PCP: Haywood Pao, MD REFERRING PROVIDER: Britt Bottom, MD    PT End of Session - 02/11/22 1019     Visit Number 2    Number of Visits 13   Plus eval   Date for PT Re-Evaluation 03/29/22    Authorization Type Medicare    Progress Note Due on Visit 10    PT Start Time 1017    PT Stop Time 1057    PT Time Calculation (min) 40 min    Equipment Utilized During Treatment Gait belt    Activity Tolerance Patient tolerated treatment well    Behavior During Therapy WFL for tasks assessed/performed;Impulsive              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   Cerebellar ataxia ([place])    Dysesthesia 06/04/2016   Essential hypertension 11/05/2019   Foot pain, left 06/04/2016   Gait disturbance 03/16/2019   History of cervical spinal surgery 06/04/2016   History of hiatal hernia    History of kidney stones    Hypertension    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   Memory loss 03/16/2019   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   Pain due to onychomycosis of toenails of both feet 04/28/2019   Pain in left ankle and joints of left foot 08/06/2018   Slurred speech 03/16/2019   Superficial peroneal nerve neuropathy, 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   TIA (transient ischemic attack) 11/05/2019   Worsened handwriting 03/16/2019   Past Surgical History:  Procedure Laterality Date   [place] N/A 09/13/2020   Procedure: CARDIOVERSION;  Surgeon: Sanda Klein, MD;  Location: [place];  Service: Cardiovascular;  Laterality: N/A;   FRACTURE SURGERY     Left ankle, sx x4   INGUINAL HERNIA REPAIR Right 03/05/2019   Procedure: OPEN RIGHT INGUINAL HERNIA REPAIR WITH MESH;  Surgeon: Clovis Riley, MD;  Location: [place];  Service: General;  Laterality:  Right;   [place] N/A 07/27/2020   Procedure: LEFT ATRIAL APPENDAGE OCCLUSION;  Surgeon: Sherren Mocha, MD;  Location: [place] [facility];  Service: Cardiovascular;  Laterality: N/A;   SHOULDER SURGERY Left    [place] WITHOUT CARDIOVERSION N/A 07/27/2020   Procedure: TRANSESOPHAGEAL ECHOCARDIOGRAM (TEE);  Surgeon: Sherren Mocha, MD;  Location: [place] [facility];  Service: Cardiovascular;  Laterality: N/A;   [place] WITHOUT CARDIOVERSION N/A 09/13/2020   Procedure: TRANSESOPHAGEAL ECHOCARDIOGRAM (TEE);  Surgeon: Sanda Klein, MD;  Location: [facility];  Service: Cardiovascular;  Laterality: N/A;   TONSILLECTOMY     1945   Patient Active Problem List   Diagnosis Date Noted   Pain in right shoulder 07/31/2021   Persistent atrial fibrillation ([place]) 08/28/2020   Secondary hypercoagulable state ([place]) 07/20/2020   Paroxysmal atrial fibrillation ([place]) 06/19/2020   TIA (transient ischemic attack) 11/05/2019   Essential hypertension 11/05/2019   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/05/2019   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   C6 radiculopathy 05/04/2019   Pain due to onychomycosis of toenails of both feet 04/28/2019   Worsened handwriting 03/16/2019   Gait disturbance 03/16/2019   Slurred speech 03/16/2019   Memory loss 03/16/2019   Pain in left ankle and joints of left foot 08/06/2018  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   Foot pain, left 06/04/2016   Dysesthesia 06/04/2016   Superficial peroneal nerve neuropathy, 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   History of cervical spinal surgery 06/04/2016    ONSET DATE: 02/06/2022   REFERRING DIAG: R26.9 (ICD-10-CM) - Gait disturbance G11.9 (ICD-10-CM) - Cerebellar ataxia (HCC)   THERAPY DIAG:  Unsteadiness on feet  Other lack of coordination  Ataxic gait  Rationale for Evaluation and Treatment Rehabilitation  SUBJECTIVE:                                                                                                                                                                                               SUBJECTIVE STATEMENT: Pt reports he is doing well, brought updated medicine list with him to session. No new changes or falls.    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 NCV/EMG study did not show any evidence of motor neuron disease. We did the [employer] SCA panel (30+ genes). It was borderline. 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 No  PRECAUTIONS: Fall  FALLS: Has patient fallen in last 6 months? Yes. Number of falls around 3, mostly inside his home all in retro direction  PLOF: Independent with basic ADLs and Requires assistive device for independence  PATIENT GOALS "balance"  OBJECTIVE:   DIAGNOSTIC FINDINGS: NCV/EMG study 05/04/2019 showed the following: 1.   Chronic right C6 radiculopathy.  There could also be milder C5 and C7 chronic radiculopathies. 2.   Chronic left S1 radiculopathy. 3.   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   There was no evidence of polyneuropathy, motor neuron disease or myopathy.  COORDINATION: Heel to shin test: Dysmetria bilaterally    POSTURE: rounded shoulders, forward head, increased thoracic kyphosis, and  posterior pelvic tilt   TODAY'S TREATMENT:    Ther Act MCTSIB:  Condition 1: Avg of 3 trials: 30 sec  Condition 2: Avg of 3 trials: 30 sec  Condition 3: Avg of  3 trials: 30 sec  Condition 4: Avg of 3 trials: 30 sec  Total Score: 120/120  Ther Ex  Established and demonstrated initial HEP for improved transfers and BLE coordination:  - Sit to Stand Without Arm Support, x10 reps w/min cues for proper form as pt has habit of bracing his BLEs against mat.     - Step sideways with arms reaching at counter, x8 reps per side using post-it notes on cabinets as visual cue to reach. Min cues for increased step length of RLE > LLE     - Standing Balance Activity: Plyometric Modified Lower Extremity Jumping Jack at [employer], x20 reps. Pt w/significant difficulty coordinating jump, so regressed to part-practice of jumping in place and progressed to stance jacks.   - Modified Mountain Climbers on Counter, x10 reps per side. Pt unable to jump throughout and required max cues to slow down, as pt very impulsive and rushing through movement.    - Side Stepping with Resistance at Thighs and Counter Support, using red theraband. Down and back x4 each direction. Min cues to maintain abduction of BLEs   - Forward Backward Monster Walk with Band at Thighs and [employer], using red theraband. Noted good step length in retro direction.    In // bars for improved BLE coordination and single leg stability:  -alt toe taps to 6" step without UE support, x12 per side. Increased difficulty clearing RLE > LLE, mod cues to slow down as pt rushing through movement and sliding his feet onto/off of step  -Lateral lunge to step on R side, x15 reps. Min cues for slowed movement and for increased step length/clearance of RLE. CGA throughout for safety.    GAIT: Gait pattern: step through pattern, decreased stride length, decreased hip/knee flexion- Right, decreased hip/knee flexion- Left, ataxic, lateral hip  instability, decreased trunk rotation, and narrow BOS Distance walked: Various clinic treatments  Assistive device utilized: Walker - 4 wheeled Level of assistance: SBA Comments: Pt demonstrates lateral gait deviations bilaterally due to decreased pelvic rotation and increased lateral lean. Min cues to slow down as pt is impulsive and loses balance when moving too quickly   PATIENT EDUCATION: Education details: Initial HEP  Person educated: Patient Education method: Explanation, Demonstration, and Handouts Education comprehension: verbalized understanding   HOME EXERCISE PROGRAM: Access Code: [email or web address] URL: [email or web address] Date: 02/11/2022 Prepared by: Mickie Bail Lucky Trotta  Exercises (using red theraband)  - Sit to Stand Without Arm Support  - 1 x daily - 7 x weekly - 3 sets - 10 reps - Step sideways with arms reaching at counter   - 1 x daily - 7 x weekly - 3 sets - 10 reps - Standing Balance Activity: Plyometric Modified Lower Extremity Jumping Jack  - 1 x daily - 7 x weekly - 3 sets - 10 reps - [employer] on Counter  - 1 x daily - 7 x weekly - 3 sets - 10 reps - Side Stepping with Resistance at [employer] and Counter Support  - 1 x daily - 7 x weekly - 3 sets - 10 reps - Forward Backward Monster Walk with Band at [employer] and Counter Support  - 1 x daily - 7 x weekly - 3 sets - 10 reps    GOALS: Goals reviewed with patient? Yes  SHORT TERM GOALS: Target date: 03/01/2022  Pt will be independent with initial HEP for improved strength, balance, transfers and gait.  Baseline: Not established on eval  Goal status: INITIAL  2.  Pt will improve 5 x STS to less than or equal to 15 seconds without bracing against chair or >2 instances of retropulsion to demonstrate improved functional strength and transfer efficiency.   Baseline: 14.6s without UE support, significant retropulsion and bracing against chair w/BLEs Goal status: INITIAL  3.  Pt will improve Berg score  to 41/56 for decreased fall risk  Baseline: 37/56 Goal status: INITIAL  4.  Pt will perform floor transfer and LTG updated as appropriate  Baseline:  Goal status: INITIAL   LONG TERM GOALS: Target date: 03/22/2022  Pt will be independent with final HEP for improved strength, balance, transfers and gait.  Baseline:  Goal status: INITIAL  2.  Pt will improve Berg score to 45/56 for decreased fall risk  Baseline: 37/56 Goal status: INITIAL  3.  Pt will improve normal TUG to less than or equal to 14 seconds w/LRAD for improved functional mobility and decreased fall risk.  Baseline: 18.31s w/rollator  Goal status: INITIAL  4.  Floor transfer goal  Baseline:  Goal status: INITIAL  5.  Pt will improve gait velocity to at least 2.9 ft/s wit LRAD for improved gait efficiency  Baseline: 2.45 ft/s w/LRAD  Goal status: INITIAL  ASSESSMENT:  CLINICAL IMPRESSION: Emphasis of skilled PT session on establishing initial HEP for improved BLE coordination and plyometrics. Pt demonstrates significant difficulty coordinating plyometric tasks, such as jumping. Pt scored a 120/120 on MCTSIB, indicative of good vestibular input for balance. Pt is very impulsive and requires mod-max verbal cues to slow down to improve his quality of movement and stability. Continue POC.     OBJECTIVE IMPAIRMENTS Abnormal gait, decreased balance, decreased coordination, decreased mobility, difficulty walking, and decreased safety awareness.   ACTIVITY LIMITATIONS lifting, standing, stairs, transfers, reach over head, and locomotion level  PARTICIPATION LIMITATIONS: cleaning, laundry, driving, shopping, community activity, and yard work  [place], Fitness, Time since onset of injury/illness/exacerbation, and 1-2 comorbidities: cerebellar ataxia and history of TIA  are also affecting patient's functional outcome.   REHAB POTENTIAL: Good  CLINICAL DECISION MAKING: Evolving/moderate  complexity  EVALUATION COMPLEXITY: Moderate  PLAN: PT FREQUENCY: 2x/week  PT DURATION: 6 weeks  PLANNED INTERVENTIONS: Therapeutic exercises, Therapeutic activity, Neuromuscular re-education, Balance training, Gait training, Patient/Family education, Self Care, Stair training, Vestibular training, Canalith repositioning, DME instructions, Aquatic Therapy, Manual therapy, and Re-evaluation  PLAN FOR NEXT SESSION: Retro gait, Eccentric heel taps, Plyometrics, safety w/gait (he uses SPC at home), try weighted vest    Jazen Spraggins E Santosha Jividen, PT, DPT 02/11/2022, 11:00 AM

## 2022-02-13 ENCOUNTER — Ambulatory Visit: Payer: Medicare Other | Admitting: Physical Therapy

## 2022-02-13 DIAGNOSIS — R278 Other lack of coordination: Secondary | ICD-10-CM

## 2022-02-13 DIAGNOSIS — R2681 Unsteadiness on feet: Secondary | ICD-10-CM | POA: Diagnosis not present

## 2022-02-13 DIAGNOSIS — R260 Ataxic gait: Secondary | ICD-10-CM

## 2022-02-13 DIAGNOSIS — Z9181 History of falling: Secondary | ICD-10-CM | POA: Diagnosis not present

## 2022-02-13 DIAGNOSIS — Z23 Encounter for immunization: Secondary | ICD-10-CM | POA: Diagnosis not present

## 2022-02-13 DIAGNOSIS — G119 Hereditary ataxia, unspecified: Secondary | ICD-10-CM | POA: Diagnosis not present

## 2022-02-13 DIAGNOSIS — R269 Unspecified abnormalities of gait and mobility: Secondary | ICD-10-CM | POA: Diagnosis not present

## 2022-02-13 NOTE — Therapy (Signed)
OUTPATIENT PHYSICAL THERAPY NEURO TREATMENT   Patient Name: Derrick White MRN: [id number] DOB:1941/02/16, 81 y.o., male Today's Date: 02/13/2022   PCP: Derrick Pao, MD REFERRING PROVIDER: Britt Bottom, MD    PT End of Session - 02/13/22 1105     Visit Number 3    Number of Visits 13   Plus eval   Date for PT Re-Evaluation 03/29/22    Authorization Type Medicare    Progress Note Due on Visit 10    PT Start Time 1102    PT Stop Time 1145    PT Time Calculation (min) 43 min    Equipment Utilized During Treatment Gait belt;Other (comment)   15# weighted vest   Activity Tolerance Patient tolerated treatment well    Behavior During Therapy WFL for tasks assessed/performed;Impulsive               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   Cerebellar ataxia ([place])    Dysesthesia 06/04/2016   Essential hypertension 11/05/2019   Foot pain, left 06/04/2016   Gait disturbance 03/16/2019   History of cervical spinal surgery 06/04/2016   History of hiatal hernia    History of kidney stones    Hypertension    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   Memory loss 03/16/2019   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   Pain due to onychomycosis of toenails of both feet 04/28/2019   Pain in left ankle and joints of left foot 08/06/2018   Slurred speech 03/16/2019   Superficial peroneal nerve neuropathy, 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   TIA (transient ischemic attack) 11/05/2019   Worsened handwriting 03/16/2019   Past Surgical History:  Procedure Laterality Date   [place] N/A 09/13/2020   Procedure: CARDIOVERSION;  Surgeon: Derrick Klein, MD;  Location: [place];  Service: Cardiovascular;  Laterality: N/A;   FRACTURE SURGERY     Left ankle, sx x4   INGUINAL HERNIA REPAIR Right 03/05/2019   Procedure: OPEN RIGHT INGUINAL HERNIA REPAIR WITH MESH;  Surgeon: Derrick Riley, MD;  Location: [facility];  Service: General;  Laterality: Right;   [place] N/A 07/27/2020   Procedure: LEFT ATRIAL APPENDAGE OCCLUSION;  Surgeon: Derrick Mocha, MD;  Location: [facility];  Service: Cardiovascular;  Laterality: N/A;   SHOULDER SURGERY Left    [place] WITHOUT CARDIOVERSION N/A 07/27/2020   Procedure: TRANSESOPHAGEAL ECHOCARDIOGRAM (TEE);  Surgeon: Derrick Mocha, MD;  Location: [place] [facility];  Service: Cardiovascular;  Laterality: N/A;   TEE WITHOUT CARDIOVERSION N/A 09/13/2020   Procedure: TRANSESOPHAGEAL ECHOCARDIOGRAM (TEE);  Surgeon: Derrick Klein, MD;  Location: [facility];  Service: Cardiovascular;  Laterality: N/A;   TONSILLECTOMY     1945   Patient Active Problem List   Diagnosis Date Noted   Pain in right shoulder 07/31/2021   Persistent atrial fibrillation ([place]) 08/28/2020   Secondary hypercoagulable state ([place]) 07/20/2020   Paroxysmal atrial fibrillation ([place]) 06/19/2020   TIA (transient ischemic attack) 11/05/2019   Essential hypertension 11/05/2019   Cerebellar ataxia ([place]) 11/05/2019   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   C6 radiculopathy 05/04/2019   Pain due to onychomycosis of toenails of both feet 04/28/2019   Worsened handwriting 03/16/2019   Gait disturbance 03/16/2019   Slurred speech 03/16/2019   Memory loss 03/16/2019   Pain in left ankle and joints  of left foot 08/06/2018   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   Foot pain, left 06/04/2016   Dysesthesia 06/04/2016   Superficial peroneal nerve neuropathy, 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   History of cervical spinal surgery 06/04/2016    ONSET DATE: 02/06/2022   REFERRING DIAG: R26.9 (ICD-10-CM) - Gait disturbance G11.9 (ICD-10-CM) - Cerebellar ataxia (HCC)   THERAPY DIAG:  Unsteadiness on feet  Other lack of coordination  Ataxic gait  Rationale for Evaluation and Treatment Rehabilitation  SUBJECTIVE:                                                                                                                                                                                               SUBJECTIVE STATEMENT: Pt reports he is doing well, "I still cannot do a jumping jack but other than that I am good". 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 NCV/EMG study did not show any evidence of motor neuron disease. We did the [employer] SCA panel (30+ genes). It was borderline. 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 Unclear if this is playing a role (SCA type 6 has mutations in this Derrick White but usually triplet repeat)   PAIN:  Are you having pain? No  PRECAUTIONS: Fall  FALLS: Has patient fallen in last 6 months? Yes. Number of falls around 3, mostly inside his home all in retro direction  PLOF: Independent with basic ADLs and Requires assistive device for independence  PATIENT GOALS "balance"  OBJECTIVE:   DIAGNOSTIC FINDINGS: NCV/EMG study 05/04/2019 showed the following: 1.   Chronic right C6 radiculopathy.  There could also be milder C5 and C7 chronic radiculopathies. 2.   Chronic left S1 radiculopathy. 3.   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   There was no evidence of polyneuropathy, motor neuron disease or myopathy.  COORDINATION: Heel to shin test: Dysmetria bilaterally    POSTURE: rounded shoulders,  forward head, increased thoracic kyphosis, and posterior pelvic tilt   TODAY'S TREATMENT:            Ther Ex  SciFit multi-peaks level 6 for  8 minutes using BUE/BLEs for neural priming for reciprocal movement, dynamic cardiovascular warmup and global strengthening. Min cues for proper transfer onto/off of SciFit, as pt attempting to sit on seat prior to fully turning   NMR  Gait pattern: step through pattern, decreased stride length, decreased hip/knee flexion- Right, decreased hip/knee flexion- Left, ataxic, trunk flexed, and wide BOS Distance walked: >200' around clinic  Assistive device utilized: Environmental consultant - 4 wheeled and 3# ankle weights on BLEs Level of assistance: Modified independence Comments: Trialed use of 3# ankle weights w/gait to assess if added tactile cue will improve BLE coordination. Noted continued truncal ataxia and decreased step clearance w/use of weights  Gait pattern: step through pattern, decreased stride length, decreased hip/knee flexion- Right, decreased hip/knee flexion- Left, ataxic, trunk flexed, and wide BOS Distance walked: >200' around clinic  Assistive device utilized: Walker - 4 wheeled and 3# ankle weights on BLEs and 15# weighted vest Level of assistance: Modified independence Comments: Noted improved truncal stability w/use of weighted vest and narrowed BOS. No instability noted.   Using multicolored dots placed in semicircle in front of pt, had pt step to random color called out by therapist and return foot to starting position for improved stepping strategy and LE coordination. Initially performed w/3# ankle weights only, but pt unable to stabilize without UE support. Added 15# weighted vest and pt able to stabilize well, CGA-min A throughout for steadying assist. Min cues for pt to take one single, large step to dot and one large step back. Noted increased difficulty stepping w/RLE, as pt tends to slide foot forward and backward.   In // bars for improved  step length, single leg stability and BLE strength: -alt fwd and retro step over 4" hurdle using 15# weighted vest and 3# ankle weights without UE support, x12 reps per side. Pt continues to demonstrate difficulty stepping w/RLE and required min A for stabilization when hitting hurdle w/foot. Noted improved truncal stability w/use of weighted vest.    GAIT: Gait pattern: step through pattern, decreased stride length, decreased hip/knee flexion- Right, decreased hip/knee flexion- Left, ataxic, lateral hip instability, decreased trunk rotation, and narrow BOS Distance walked: Various clinic treatments  Assistive device utilized: Walker - 4 wheeled Level of assistance: SBA Comments: Pt demonstrates lateral gait deviations bilaterally due to decreased pelvic rotation and increased lateral lean. Min cues to slow down as pt is impulsive and loses balance when moving too quickly   PATIENT EDUCATION: Education details: Continue HEP, info on next session  Person educated: Patient Education method: Customer service manager Education comprehension: verbalized understanding   HOME EXERCISE PROGRAM: Access Code: [email or web address] URL: [email or web address] Date: 02/11/2022 Prepared by: Mickie Bail Analiz Tvedt  Exercises (using red theraband)  - Sit to Stand Without Arm Support  - 1 x daily - 7 x weekly - 3 sets - 10 reps - Step sideways with arms reaching at counter   - 1 x daily - 7 x weekly - 3 sets - 10 reps - Standing Balance Activity: Plyometric Modified Lower Extremity Jumping Jack  - 1 x daily - 7 x weekly - 3 sets - 10 reps - [employer] on Counter  - 1 x daily - 7 x weekly - 3 sets - 10 reps - Side Stepping with Resistance at [employer] and Counter Support  - 1 x daily - 7 x weekly - 3 sets - 10 reps - Forward Backward Monster Walk with Band at [employer] and Counter Support  - 1 x daily -  7 x weekly - 3 sets - 10 reps    GOALS: Goals reviewed with patient? Yes  SHORT TERM GOALS:  Target date: 03/01/2022  Pt will be independent with initial HEP for improved strength, balance, transfers and gait.  Baseline: Not established on eval  Goal status: INITIAL  2.  Pt will improve 5 x STS to less than or equal to 15 seconds without bracing against chair or >2 instances of retropulsion to demonstrate improved functional strength and transfer efficiency.   Baseline: 14.6s without UE support, significant retropulsion and bracing against chair w/BLEs Goal status: INITIAL  3.  Pt will improve Berg score to 41/56 for decreased fall risk  Baseline: 37/56 Goal status: INITIAL  4.  Pt will perform floor transfer and LTG updated as appropriate  Baseline:  Goal status: INITIAL   LONG TERM GOALS: Target date: 03/22/2022  Pt will be independent with final HEP for improved strength, balance, transfers and gait.  Baseline:  Goal status: INITIAL  2.  Pt will improve Berg score to 45/56 for decreased fall risk  Baseline: 37/56 Goal status: INITIAL  3.  Pt will improve normal TUG to less than or equal to 14 seconds w/LRAD for improved functional mobility and decreased fall risk.  Baseline: 18.31s w/rollator  Goal status: INITIAL  4.  Floor transfer goal  Baseline:  Goal status: INITIAL  5.  Pt will improve gait velocity to at least 2.9 ft/s wit LRAD for improved gait efficiency  Baseline: 2.45 ft/s w/LRAD  Goal status: INITIAL  ASSESSMENT:  CLINICAL IMPRESSION: Emphasis of skilled PT session on gait training w/use of weighted vest, LE coordination and stepping strategies. Pt had good response to use of weighted vest, more so than use of ankle weights, and demonstrated improved truncal stability without UE support. Pt continues to have difficulty stepping w/RLE and coordinating LE movement, contributing to asymmetric gait pattern and instability. Pt also impulsive with transfers and requires min cues for safety. Continue POC.     OBJECTIVE IMPAIRMENTS Abnormal gait,  decreased balance, decreased coordination, decreased mobility, difficulty walking, and decreased safety awareness.   ACTIVITY LIMITATIONS lifting, standing, stairs, transfers, reach over head, and locomotion level  PARTICIPATION LIMITATIONS: cleaning, laundry, driving, shopping, community activity, and yard work  [place] Age, Fitness, Time since onset of injury/illness/exacerbation, and 1-2 comorbidities: cerebellar ataxia and history of TIA  are also affecting patient's functional outcome.   REHAB POTENTIAL: Good  CLINICAL DECISION MAKING: Evolving/moderate complexity  EVALUATION COMPLEXITY: Moderate  PLAN: PT FREQUENCY: 2x/week  PT DURATION: 6 weeks  PLANNED INTERVENTIONS: Therapeutic exercises, Therapeutic activity, Neuromuscular re-education, Balance training, Gait training, Patient/Family education, Self Care, Stair training, Vestibular training, Canalith repositioning, DME instructions, Aquatic Therapy, Manual therapy, and Re-evaluation  PLAN FOR NEXT SESSION: Retro gait, Eccentric heel taps, Plyometrics, safety w/gait and transfers (he uses SPC at home), continue to practice w/ weighted vest (I used 15#), LE coordination, floor transfer    Cruzita Lederer Romuald Mccaslin, PT, DPT 02/13/2022, 11:50 AM

## 2022-02-20 ENCOUNTER — Ambulatory Visit: Payer: Medicare Other | Attending: Neurology | Admitting: Physical Therapy

## 2022-02-20 DIAGNOSIS — Z9181 History of falling: Secondary | ICD-10-CM | POA: Diagnosis not present

## 2022-02-20 DIAGNOSIS — R260 Ataxic gait: Secondary | ICD-10-CM | POA: Diagnosis not present

## 2022-02-20 DIAGNOSIS — R278 Other lack of coordination: Secondary | ICD-10-CM | POA: Diagnosis not present

## 2022-02-20 DIAGNOSIS — R2681 Unsteadiness on feet: Secondary | ICD-10-CM | POA: Insufficient documentation

## 2022-02-20 NOTE — Therapy (Signed)
OUTPATIENT PHYSICAL THERAPY NEURO TREATMENT   Patient Name: Derrick White MRN: [id number] DOB:23-Sep-1940, 81 y.o., male Today's Date: 02/20/2022   PCP: Haywood Pao, MD REFERRING PROVIDER: Britt Bottom, MD    PT End of Session - 02/20/22 1018     Visit Number 4    Number of Visits 13   Plus eval   Date for PT Re-Evaluation 03/29/22    Authorization Type Medicare    Progress Note Due on Visit 10    PT Start Time 8938    PT Stop Time 1055    PT Time Calculation (min) 40 min    Equipment Utilized During Treatment Gait belt;Other (comment)   15# weighted vest   Activity Tolerance Patient tolerated treatment well    Behavior During Therapy WFL for tasks assessed/performed;Impulsive                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   Cerebellar ataxia ([place])    Dysesthesia 06/04/2016   Essential hypertension 11/05/2019   Foot pain, left 06/04/2016   Gait disturbance 03/16/2019   History of cervical spinal surgery 06/04/2016   History of hiatal hernia    History of kidney stones    Hypertension    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   Memory loss 03/16/2019   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   Pain due to onychomycosis of toenails of both feet 04/28/2019   Pain in left ankle and joints of left foot 08/06/2018   Slurred speech 03/16/2019   Superficial peroneal nerve neuropathy, 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   TIA (transient ischemic attack) 11/05/2019   Worsened handwriting 03/16/2019   Past Surgical History:  Procedure Laterality Date   [place] N/A 09/13/2020   Procedure: CARDIOVERSION;  Surgeon: Sanda Klein, MD;  Location: [place];  Service: Cardiovascular;  Laterality: N/A;   FRACTURE SURGERY     Left ankle, sx x4   INGUINAL HERNIA REPAIR Right 03/05/2019   Procedure: OPEN RIGHT INGUINAL HERNIA REPAIR WITH MESH;  Surgeon: Clovis Riley, MD;  Location:  [place];  Service: General;  Laterality: Right;   [place] N/A 07/27/2020   Procedure: LEFT ATRIAL APPENDAGE OCCLUSION;  Surgeon: Sherren Mocha, MD;  Location: [place] [facility];  Service: Cardiovascular;  Laterality: N/A;   SHOULDER SURGERY Left    TEE WITHOUT CARDIOVERSION N/A 07/27/2020   Procedure: TRANSESOPHAGEAL ECHOCARDIOGRAM (TEE);  Surgeon: Sherren Mocha, MD;  Location: [place] [facility];  Service: Cardiovascular;  Laterality: N/A;   TEE WITHOUT CARDIOVERSION N/A 09/13/2020   Procedure: TRANSESOPHAGEAL ECHOCARDIOGRAM (TEE);  Surgeon: Sanda Klein, MD;  Location: [facility] ENDOSCOPY;  Service: Cardiovascular;  Laterality: N/A;   TONSILLECTOMY     1945   Patient Active Problem List   Diagnosis Date Noted   Pain in right shoulder 07/31/2021   Persistent atrial fibrillation ([place]) 08/28/2020   Secondary hypercoagulable state ([place]) 07/20/2020   Paroxysmal atrial fibrillation ([place]) 06/19/2020   TIA (transient ischemic attack) 11/05/2019   Essential hypertension 11/05/2019   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/05/2019   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   C6 radiculopathy 05/04/2019   Pain due to onychomycosis of toenails of both feet 04/28/2019   Worsened handwriting 03/16/2019   Gait disturbance 03/16/2019   Slurred speech 03/16/2019   Memory loss 03/16/2019   Pain in left ankle and  joints of left foot 08/06/2018   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   Foot pain, left 06/04/2016   Dysesthesia 06/04/2016   Superficial peroneal nerve neuropathy, 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 Pt does report frequent near falls/LOB where he is able to recover. Pt reports he is able to do his HEP except for the stance jacks.  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 NCV/EMG study did not show any evidence of motor neuron disease. We did the Athena SCA panel (30+ genes). It was borderline. 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 Unclear if this is playing a role (SCA type 6 has mutations in this [place] but usually triplet repeat)   PAIN:  Are you having pain? No  PRECAUTIONS: Fall  FALLS: Has patient fallen in last 6 months? Yes. Number of falls around 3, mostly inside his home all in retro direction  PLOF: Independent with basic ADLs and Requires assistive device for independence  PATIENT GOALS "balance"  OBJECTIVE:   DIAGNOSTIC FINDINGS: NCV/EMG study 05/04/2019 showed the following: 1.   Chronic right C6 radiculopathy.  There could also be milder C5 and C7 chronic radiculopathies. 2.   Chronic left S1 radiculopathy. 3.   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   There was no evidence of polyneuropathy, motor neuron disease or  myopathy.  COORDINATION: Heel to shin test: Dysmetria bilaterally    POSTURE: rounded shoulders, forward head, increased thoracic kyphosis, and posterior pelvic tilt   TODAY'S TREATMENT:  Ther Ex  Reviewed HEP, pt having difficulty with stance jacks. Pt able to perform one repetition at countertop before losing timing/sequencing of task and unable to produce enough power to continue. Broke exercise down into bringing legs apart jumping then stepping back together. Pt again exhibits difficulty with task and is only able to complete a few repetitions.  NMR  Trial gait with various configurations of weighted vest and theraband around LE:  Gait pattern: ataxic and narrow BOS Distance walked: 230 ft Assistive device utilized: Walker - 4 wheeled Level of assistance: SBA Comments: trial gait with 15# weighted vest, improved trunk control, narrow BOS  Gait pattern: ataxic and narrow BOS Distance walked: 230 ft Assistive device utilized: Environmental consultant - 4 wheeled Level of assistance: SBA Comments: with 15# weighted vest and YTB around thighs, ongoing narrow BOS and shuffling of gait with onset of fatigue  Monster walks with RTB at countertop to work on widening BOS, needs verbal cues and visual cues of tiles on floor to widen BOS  In // bars initially with BUE support progressing to no UE support: -multidirectional L/R gumdrop taps with YTB around ankles with focus on slow, controlled movements -with transition to no UE support pt unable to maintain balance, transitioned to just forward and lateral taps    PATIENT EDUCATION: Education details: Continue HEP Person educated: Patient Education method: Customer service manager Education comprehension: verbalized understanding   HOME EXERCISE PROGRAM: Access Code: [email or web address] URL: [email or web address] Date: 02/11/2022 Prepared by: Mickie Bail Plaster  Exercises (using red theraband)  - Sit to Stand Without Arm  Support  - 1 x daily - 7 x weekly - 3 sets - 10 reps - Step sideways with arms reaching at counter   - 1 x daily - 7 x weekly - 3 sets - 10 reps - Standing Balance Activity: Plyometric Modified Lower Extremity Jumping Jack  - 1 x daily - 7 x weekly - 3 sets - 10 reps - [employer] on Counter  - 1 x daily - 7 x weekly - 3 sets - 10 reps - Side Stepping with Resistance at [employer] and Counter Support  - 1 x daily - 7 x weekly - 3 sets - 10 reps - Forward Backward Monster Walk with Band at [employer] and Counter Support  - 1 x daily - 7 x weekly - 3 sets - 10 reps    GOALS: Goals reviewed with patient? Yes  SHORT TERM GOALS: Target date: 03/01/2022  Pt will be independent with initial HEP for improved strength, balance, transfers and gait.  Baseline: Not established on eval  Goal status: INITIAL  2.  Pt will improve 5 x STS to less than or equal to 15 seconds without bracing against chair or >2 instances of retropulsion to demonstrate improved functional strength and transfer efficiency.   Baseline: 14.6s without UE support, significant retropulsion and bracing against chair w/BLEs Goal status: INITIAL  3.  Pt will improve Berg score to 41/56 for decreased fall risk  Baseline: 37/56 Goal status: INITIAL  4.  Pt will perform floor transfer and LTG updated as appropriate  Baseline:  Goal status: INITIAL   LONG TERM GOALS: Target date: 03/22/2022  Pt will be independent with final HEP for improved strength, balance, transfers and gait.  Baseline:  Goal status: INITIAL  2.  Pt will improve Berg score to 45/56 for decreased fall risk  Baseline: 37/56 Goal status: INITIAL  3.  Pt will improve normal TUG to less than or equal  to 14 seconds w/LRAD for improved functional mobility and decreased fall risk.  Baseline: 18.31s w/rollator  Goal status: INITIAL  4.  Floor transfer goal  Baseline:  Goal status: INITIAL  5.  Pt will improve gait velocity to at least 2.9 ft/s wit LRAD  for improved gait efficiency  Baseline: 2.45 ft/s w/LRAD  Goal status: INITIAL  ASSESSMENT:  CLINICAL IMPRESSION: Emphasis of skilled PT session on working on decreasing truncal ataxia and improving LE gait mechanics. With use of weighted vest pt exhibits decrease in overall truncal ataxia with some carryover to gait without vest at end of session. Pt continues to exhibit narrow BOS during gait that increases with onset of fatigue. Pt also exhibits difficulty performing balance tasks with decreased UE support. Pt continues to benefit from skilled therapy services to address ongoing gait and balance impairments. Continue POC.     OBJECTIVE IMPAIRMENTS Abnormal gait, decreased balance, decreased coordination, decreased mobility, difficulty walking, and decreased safety awareness.   ACTIVITY LIMITATIONS lifting, standing, stairs, transfers, reach over head, and locomotion level  PARTICIPATION LIMITATIONS: cleaning, laundry, driving, shopping, community activity, and yard work  [place] Age, Fitness, Time since onset of injury/illness/exacerbation, and 1-2 comorbidities: cerebellar ataxia and history of TIA  are also affecting patient's functional outcome.   REHAB POTENTIAL: Good  CLINICAL DECISION MAKING: Evolving/moderate complexity  EVALUATION COMPLEXITY: Moderate  PLAN: PT FREQUENCY: 2x/week  PT DURATION: 6 weeks  PLANNED INTERVENTIONS: Therapeutic exercises, Therapeutic activity, Neuromuscular re-education, Balance training, Gait training, Patient/Family education, Self Care, Stair training, Vestibular training, Canalith repositioning, DME instructions, Aquatic Therapy, Manual therapy, and Re-evaluation  PLAN FOR NEXT SESSION: Retro gait, Eccentric heel taps, Plyometrics, safety w/gait and transfers (he uses SPC at home), continue to practice w/ weighted vest (I used 15#), LE coordination, floor transfer, gait with theraband, tall-kneeling?   Excell Seltzer, PT, DPT,  CSRS 02/20/2022, 11:00 AM

## 2022-02-22 ENCOUNTER — Ambulatory Visit: Payer: Medicare Other | Admitting: Physical Therapy

## 2022-02-22 DIAGNOSIS — R278 Other lack of coordination: Secondary | ICD-10-CM

## 2022-02-22 DIAGNOSIS — R2681 Unsteadiness on feet: Secondary | ICD-10-CM

## 2022-02-22 DIAGNOSIS — Z9181 History of falling: Secondary | ICD-10-CM | POA: Diagnosis not present

## 2022-02-22 DIAGNOSIS — R260 Ataxic gait: Secondary | ICD-10-CM

## 2022-02-22 NOTE — Therapy (Signed)
OUTPATIENT PHYSICAL THERAPY NEURO TREATMENT   Patient Name: Derrick White MRN: [id number] DOB:12/09/1940, 81 y.o., male Today's Date: 02/22/2022   PCP: Haywood Pao, MD REFERRING PROVIDER: Britt Bottom, MD    PT End of Session - 02/22/22 1021     Visit Number 5    Number of Visits 13   Plus eval   Date for PT Re-Evaluation 03/29/22    Authorization Type Medicare    Progress Note Due on Visit 10    PT Start Time 1018    PT Stop Time 1101    PT Time Calculation (min) 43 min    Equipment Utilized During Treatment Gait belt    Activity Tolerance Patient tolerated treatment well    Behavior During Therapy WFL for tasks assessed/performed;Impulsive                 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   Cerebellar ataxia ([place])    Dysesthesia 06/04/2016   Essential hypertension 11/05/2019   Foot pain, left 06/04/2016   Gait disturbance 03/16/2019   History of cervical spinal surgery 06/04/2016   History of hiatal hernia    History of kidney stones    Hypertension    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   Memory loss 03/16/2019   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   Pain due to onychomycosis of toenails of both feet 04/28/2019   Pain in left ankle and joints of left foot 08/06/2018   Slurred speech 03/16/2019   Superficial peroneal nerve neuropathy, 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   TIA (transient ischemic attack) 11/05/2019   Worsened handwriting 03/16/2019   Past Surgical History:  Procedure Laterality Date   [place] N/A 09/13/2020   Procedure: CARDIOVERSION;  Surgeon: Sanda Klein, MD;  Location: [place];  Service: Cardiovascular;  Laterality: N/A;   FRACTURE SURGERY     Left ankle, sx x4   INGUINAL HERNIA REPAIR Right 03/05/2019   Procedure: OPEN RIGHT INGUINAL HERNIA REPAIR WITH MESH;  Surgeon: Clovis Riley, MD;  Location: [facility];  Service: General;   Laterality: Right;   [place] N/A 07/27/2020   Procedure: LEFT ATRIAL APPENDAGE OCCLUSION;  Surgeon: Sherren Mocha, MD;  Location: [place] [facility];  Service: Cardiovascular;  Laterality: N/A;   SHOULDER SURGERY Left    TEE WITHOUT CARDIOVERSION N/A 07/27/2020   Procedure: TRANSESOPHAGEAL ECHOCARDIOGRAM (TEE);  Surgeon: Sherren Mocha, MD;  Location: [place] [facility];  Service: Cardiovascular;  Laterality: N/A;   TEE WITHOUT CARDIOVERSION N/A 09/13/2020   Procedure: TRANSESOPHAGEAL ECHOCARDIOGRAM (TEE);  Surgeon: Sanda Klein, MD;  [facility];  Service: Cardiovascular;  Laterality: N/A;   TONSILLECTOMY     1945   Patient Active Problem List   Diagnosis Date Noted   Pain in right shoulder 07/31/2021   Persistent atrial fibrillation ([place]) 08/28/2020   Secondary hypercoagulable state ([place]) 07/20/2020   Paroxysmal atrial fibrillation ([place]) 06/19/2020   TIA (transient ischemic attack) 11/05/2019   Essential hypertension 11/05/2019   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/05/2019   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   C6 radiculopathy 05/04/2019   Pain due to onychomycosis of toenails of both feet 04/28/2019   Worsened handwriting 03/16/2019   Gait disturbance 03/16/2019   Slurred speech 03/16/2019   Memory loss 03/16/2019   Pain in left ankle and joints of left foot  08/06/2018   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   Foot pain, left 06/04/2016   Dysesthesia 06/04/2016   Superficial peroneal nerve neuropathy, 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 Still having difficulty w/stance jacks but he is working on it. No new changes   Pt accompanied by: self   PERTINENT HISTORY:  C6 radiculopathy, Cerebellar ataxia (HCC), Dysesthesia, Essential hypertension, Foot pain (left), Gait disturbance, History of cervical spinal surgery (06/04/2016), History of hiatal hernia, History of kidney stones, 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, Memory loss, Mild cognitive impairment, Pain due to onychomycosis of toenails of both feet, Pain in left ankle and joints of left foot, Slurred speech, Superficial peroneal nerve neuropathy, left (06/04/2009),TIA (transient ischemic attack) 11/05/2019. NCV/EMG study did not show any evidence of motor neuron disease. We did the Athena SCA panel (30+ genes). It was borderline. 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 Unclear if this is playing a role (SCA type 6 has mutations in this [place] but usually triplet repeat)   PAIN:  Are you having pain? No  PRECAUTIONS: Fall  FALLS: Has patient fallen in last 6 months? Yes. Number of falls around 3, mostly inside his home all in retro direction  PLOF: Independent with basic ADLs and Requires assistive device for independence  PATIENT GOALS "balance"  OBJECTIVE:   DIAGNOSTIC FINDINGS: NCV/EMG study 05/04/2019 showed the following: 1.   Chronic right C6 radiculopathy.  There could also be milder C5 and C7 chronic radiculopathies. 2.   Chronic left S1 radiculopathy. 3.   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   There was no evidence of polyneuropathy, motor neuron disease or myopathy.  COORDINATION: Heel to shin test: Dysmetria bilaterally    POSTURE: rounded shoulders, forward head,  increased thoracic kyphosis, and posterior pelvic tilt   TODAY'S TREATMENT:  NMR  Sitting on small blue theraball for improved core stabilization and UE/LE coordination:  Alt UE flexion, x10 per side w/min  A for stabilization on ball and mod cues for proper form, as pt attempting to brace himself against mat throughout despite cues for no external support.  Alt marches, x10 per side. Pt had increased difficulty lifting RLE > LLE and required min cues to maintain alt sequence. CGA for safety.  Seated bird dogs, x15 per side. Noted pt assuming windswept position to L side w/added reps, requiring min cues to correct pelvic position intermittently. Mod cues to maintain reciprocal UE/LE sequence. Min A for stabilization throughout.  Diona Foley tosses w/2 therapists (one throwing ball and one guarding) for improved lateral reaching and visual tracking. Pt with increased difficulty reaching to R side but was able to stabilize on ball well.  Zoom ball w/2 therapists (one guarding and one playing zoom ball) for improved hand-eye coordination and reactive balance strategies. Pt able to coordinate well and maintain timing of ball without cues.    In // bars for improved lateral reaching, single leg stability and LE coordination:  Ipsilateral cone reach and cross-body stack, x9 cones per side. CGA throughout for safety, no LOB noted. Min cues to slow down as pt more unstable and uncontrolled w/fast movements  Progressed to cone drill while standing on Airex for added dynamic balance challenge. Pt had one anterior LOB requiring min A to correct.  On airex, fwd step to colored dot and back, x15 reps. Pt w/significant difficulty taking large step and frequently slid dots closer to foam in order to make task easier despite cues from therapist. Pt w/significant difficulty w/retro step to foam, min A throughout for steadying assist.   Ther Ex  SciFit multi-peaks level 6 for 8 minutes using BUE/BLEs for neural priming for  reciprocal movement, dynamic cardiovascular conditioning and increased amplitude of stepping.   Gait pattern: step through pattern, decreased hip/knee flexion- Right, decreased hip/knee flexion- Left, ataxic, and trunk flexed Distance walked: Various clinic distances  Assistive device utilized: Walker - 4 wheeled Level of assistance: Modified independence and SBA Comments: Pt impulsive w/transfers, requiring SBA for turns to mat, Scifit and // bars as pt tends to lunge himself towards obstacles or sit prior to reaching surface.    PATIENT EDUCATION: Education details: Continue HEP, next appointment time Person educated: Patient Education method: Customer service manager Education comprehension: verbalized understanding   HOME EXERCISE PROGRAM: Access Code: [email or web address] URL: [email or web address] Date: 02/11/2022 Prepared by: Mickie Bail Jaynia Fendley  Exercises (using red theraband)  - Sit to Stand Without Arm Support  - 1 x daily - 7 x weekly - 3 sets - 10 reps - Step sideways with arms reaching at counter   - 1 x daily - 7 x weekly - 3 sets - 10 reps - Standing Balance Activity: Plyometric Modified Lower Extremity Jumping Jack  - 1 x daily - 7 x weekly - 3 sets - 10 reps - [employer] on Counter  - 1 x daily - 7 x weekly - 3 sets - 10 reps - Side Stepping with Resistance at [employer] and Counter Support  - 1 x daily - 7 x weekly - 3 sets - 10 reps - Forward Backward Monster Walk with Band at [employer] and Counter Support  - 1 x daily - 7 x weekly - 3 sets - 10 reps    GOALS: Goals reviewed with patient? Yes  SHORT TERM GOALS: Target date: 03/01/2022  Pt will be independent with initial HEP for improved strength, balance, transfers and gait.  Baseline: Not established on eval  Goal status: INITIAL  2.  Pt will improve 5 x STS to less than or equal to 15 seconds without bracing against chair or >2 instances of retropulsion to demonstrate improved functional strength and  transfer efficiency.   Baseline: 14.6s without UE support, significant retropulsion and bracing against chair w/BLEs Goal status: INITIAL  3.  Pt will improve Berg score to 41/56 for decreased fall risk  Baseline: 37/56 Goal status: INITIAL  4.  Pt will perform floor transfer and LTG updated as appropriate  Baseline:  Goal status: INITIAL   LONG TERM GOALS: Target date: 03/22/2022  Pt will be independent with final HEP for improved strength, balance, transfers and gait.  Baseline:  Goal status: INITIAL  2.  Pt will improve Berg score to 45/56 for decreased fall risk  Baseline: 37/56 Goal status: INITIAL  3.  Pt will improve normal TUG to less than or equal to 14 seconds w/LRAD for improved functional mobility and decreased fall risk.  Baseline: 18.31s w/rollator  Goal status: INITIAL  4.  Floor transfer goal  Baseline:  Goal status: INITIAL  5.  Pt will improve gait velocity to at least 2.9 ft/s wit LRAD for improved gait efficiency  Baseline: 2.45 ft/s w/LRAD  Goal status: INITIAL  ASSESSMENT:  CLINICAL IMPRESSION: Emphasis of skilled PT session on core stability, UE/LE coordination and reactive balance strategies. Pt demonstrates increased difficulty shifting weight to L side without significant truncal compensation. Pt also very impulsive and requires mod cues for slowed, controlled movement and to properly manage rollator. Continue POC.    OBJECTIVE IMPAIRMENTS Abnormal gait, decreased balance, decreased coordination, decreased mobility, difficulty walking, and decreased safety awareness.   ACTIVITY LIMITATIONS lifting, standing, stairs, transfers, reach over head, and locomotion level  PARTICIPATION LIMITATIONS: cleaning, laundry, driving, shopping, community activity, and yard work  [place] Age, Fitness, Time since onset of injury/illness/exacerbation, and 1-2 comorbidities: cerebellar ataxia and history of TIA  are also affecting patient's  functional outcome.   REHAB POTENTIAL: Good  CLINICAL DECISION MAKING: Evolving/moderate complexity  EVALUATION COMPLEXITY: Moderate  PLAN: PT FREQUENCY: 2x/week  PT DURATION: 6 weeks  PLANNED INTERVENTIONS: Therapeutic exercises, Therapeutic activity, Neuromuscular re-education, Balance training, Gait training, Patient/Family education, Self Care, Stair training, Vestibular training, Canalith repositioning, DME instructions, Aquatic Therapy, Manual therapy, and Re-evaluation  PLAN FOR NEXT SESSION: Retro gait, Eccentric heel taps, Plyometrics, safety w/gait and transfers (he uses SPC at home), continue to practice w/ weighted vest (I used 15#), LE coordination, floor transfer, gait with theraband, tall-kneeling?   Cruzita Lederer Rhapsody Wolven, PT, DPT  02/22/2022, 11:02 AM

## 2022-02-23 DIAGNOSIS — Z23 Encounter for immunization: Secondary | ICD-10-CM | POA: Diagnosis not present

## 2022-02-25 ENCOUNTER — Ambulatory Visit: Payer: Medicare Other | Admitting: Physical Therapy

## 2022-02-25 DIAGNOSIS — R260 Ataxic gait: Secondary | ICD-10-CM

## 2022-02-25 DIAGNOSIS — R278 Other lack of coordination: Secondary | ICD-10-CM | POA: Diagnosis not present

## 2022-02-25 DIAGNOSIS — Z9181 History of falling: Secondary | ICD-10-CM | POA: Diagnosis not present

## 2022-02-25 DIAGNOSIS — R2681 Unsteadiness on feet: Secondary | ICD-10-CM

## 2022-02-25 NOTE — Therapy (Signed)
OUTPATIENT PHYSICAL THERAPY NEURO TREATMENT   Patient Name: Derrick White MRN: [id number] DOB:04/29/1941, 81 y.o., male Today's Date: 02/25/2022   PCP: Haywood Pao, MD REFERRING PROVIDER: Britt Bottom, MD    PT End of Session - 02/25/22 1021     Visit Number 6    Number of Visits 13   Plus eval   Date for PT Re-Evaluation 03/29/22    Authorization Type Medicare    Progress Note Due on Visit 10    PT Start Time 1019   Previous pt session ran late   PT Stop Time 1100    PT Time Calculation (min) 41 min    Equipment Utilized During Treatment Gait belt    Activity Tolerance Patient tolerated treatment well    Behavior During Therapy WFL for tasks assessed/performed;Impulsive                 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   Cerebellar ataxia ([place])    Dysesthesia 06/04/2016   Essential hypertension 11/05/2019   Foot pain, left 06/04/2016   Gait disturbance 03/16/2019   History of cervical spinal surgery 06/04/2016   History of hiatal hernia    History of kidney stones    Hypertension    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   Memory loss 03/16/2019   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   Pain due to onychomycosis of toenails of both feet 04/28/2019   Pain in left ankle and joints of left foot 08/06/2018   Slurred speech 03/16/2019   Superficial peroneal nerve neuropathy, 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   TIA (transient ischemic attack) 11/05/2019   Worsened handwriting 03/16/2019   Past Surgical History:  Procedure Laterality Date   [place] N/A 09/13/2020   Procedure: CARDIOVERSION;  Surgeon: Sanda Klein, MD;  Location: [place];  Service: Cardiovascular;  Laterality: N/A;   FRACTURE SURGERY     Left ankle, sx x4   INGUINAL HERNIA REPAIR Right 03/05/2019   Procedure: OPEN RIGHT INGUINAL HERNIA REPAIR WITH MESH;  Surgeon: Clovis Riley, MD;  Location: [place];  Service: General;  Laterality: Right;   [place] N/A 07/27/2020   Procedure: LEFT ATRIAL APPENDAGE OCCLUSION;  Surgeon: Sherren Mocha, MD;  Location: [facility];  Service: Cardiovascular;  Laterality: N/A;   SHOULDER SURGERY Left    [place] WITHOUT CARDIOVERSION N/A 07/27/2020   Procedure: TRANSESOPHAGEAL ECHOCARDIOGRAM (TEE);  Surgeon: Sherren Mocha, MD;  Location: [facility];  Service: Cardiovascular;  Laterality: N/A;   TE[place] WITHOUT CARDIOVERSION N/A 09/13/2020   Procedure: TRANSESOPHAGEAL ECHOCARDIOGRAM (TEE);  Surgeon: Sanda Klein, MD;  Location: [facility];  Service: Cardiovascular;  Laterality: N/A;   TONSILLECTOMY     1945   Patient Active Problem List   Diagnosis Date Noted   Pain in right shoulder 07/31/2021   Persistent atrial fibrillation ([place]) 08/28/2020   Secondary hypercoagulable state ([place]) 07/20/2020   Paroxysmal atrial fibrillation ([place]) 06/19/2020   TIA (transient ischemic attack) 11/05/2019   Essential hypertension 11/05/2019   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/05/2019   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   C6 radiculopathy 05/04/2019   Pain due to onychomycosis of toenails of both feet 04/28/2019   Worsened handwriting 03/16/2019   Gait disturbance 03/16/2019   Slurred speech 03/16/2019   Memory loss 03/16/2019   Pain in left  ankle and joints of left foot 08/06/2018   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   Foot pain, left 06/04/2016   Dysesthesia 06/04/2016   Superficial peroneal nerve neuropathy, 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   History of cervical spinal surgery 06/04/2016    ONSET DATE: 02/06/2022   REFERRING DIAG: R26.9 (ICD-10-CM) - Gait disturbance G11.9 (ICD-10-CM) - Cerebellar ataxia (H[facility])   THERAPY DIAG:  Unsteadiness on feet  Other lack of coordination  Ataxic gait  Rationale for Evaluation and Treatment Rehabilitation  SUBJECTIVE:                                                                                                                                                                                               SUBJECTIVE STATEMENT: Pt reports he is able to do about 1-2 stance jacks now but they are still difficult. No new falls or changes.   Pt accompanied by: self   PERTINENT HISTORY:  C6 radiculopathy, Cerebellar ataxia ([place]), Dysesthesia, Essential hypertension, Foot pain (left), Gait disturbance, History of cervical spinal surgery (06/04/2016), History of hiatal hernia, History of kidney stones, 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, Memory loss, Mild cognitive impairment, Pain due to onychomycosis of toenails of both feet, Pain in left ankle and joints of left foot, Slurred speech, Superficial peroneal nerve neuropathy, left (06/04/2009),TIA (transient ischemic attack) 11/05/2019. NCV/EMG study did not show any evidence of motor neuron disease. We did the [employer] SCA panel (30+ genes). It was borderline. 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 Unclear if this is playing a role (SCA type 6 has mutations in this Dvid but usually triplet repeat)   PAIN:  Are you having pain? No  PRECAUTIONS: Fall  FALLS: Has patient fallen in last 6 months? Yes. Number of falls around 3, mostly inside his home all in retro direction  PLOF: Independent with basic ADLs and Requires assistive device for independence  PATIENT GOALS "balance"  OBJECTIVE:   DIAGNOSTIC FINDINGS: NCV/EMG study 05/04/2019 showed the following: 1.   Chronic right C6 radiculopathy.  There could also be milder C5 and C7 chronic radiculopathies. 2.   Chronic left S1 radiculopathy. 3.   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   There was no evidence of polyneuropathy, motor neuron disease or myopathy.  COORDINATION: Heel to shin test: Dysmetria bilaterally    POSTURE: rounded shoulders, forward  head, increased thoracic kyphosis, and posterior pelvic tilt   TODAY'S TREATMENT:  NMR  Practiced proper floor transfer technique x1 mod I to ensure safe fall recovery at home  and proper sequencing during session  The following were performed on the red floor mat for improved core stability, UE/LE coordination, lateral weight shifting and reaching out of BOS:  Bird dogs, x12 per side. Min cues to slow down movement and hold for 2-3s each side for improved stability. CGA-min A throughout for stability at pelvis.   Plank walk outs, x5 per side. Noted decreased step clearance/length w/RLE > LLE.  Progressed to having hands on Bosu black side up, 2x4 per side. Pt w/significant difficulty stabilizing on bosu and was unable to weight shift well in order to step out w/BLEs.  Attempted spidermans, but pt unable to perform >2 per side due to hypomobility of hips/hamstrings Tall kneel on blue airex w/B shoulder abduction using yellow theraband, x15 Progressed to diagonal shoulder flexion, x15 each direction. Noted minor A/P sway throughout but no LOB noted  Ball tosses using 2 therapists while pt tall kneeling on blue airex x5 minutes. Noted increased difficulty maintaining balance  Final floor transfer back to mat w/CGA due to fatigue. Min cues for proper form, as pt unable to remember technique from beginning of session.   Ther Ex  SciFit multi-peaks level 7 for 8 minutes using BUE/BLEs for neural priming for reciprocal movement, dynamic cardiovascular conditioning and increased amplitude of stepping.   Gait pattern: step through pattern, decreased hip/knee flexion- Right, decreased hip/knee flexion- Left, ataxic, and trunk flexed Distance walked: Various clinic distances  Assistive device utilized: Walker - 4 wheeled Level of assistance: Modified independence and SBA Comments: Pt impulsive w/transfers, requiring SBA for transfers. Noted improved management of AD today compared to previous sessions.      PATIENT EDUCATION: Education details: Continue HEP, next appointment time Person educated: Patient Education method: Customer service manager Education comprehension: verbalized understanding   HOME EXERCISE PROGRAM: Access Code: [email or web address] URL: [email or web address] Date: 02/11/2022 Prepared by: Mickie Bail Tayon Parekh  Exercises (using red theraband)  - Sit to Stand Without Arm Support  - 1 x daily - 7 x weekly - 3 sets - 10 reps - Step sideways with arms reaching at counter   - 1 x daily - 7 x weekly - 3 sets - 10 reps - Standing Balance Activity: Plyometric Modified Lower Extremity Jumping Jack  - 1 x daily - 7 x weekly - 3 sets - 10 reps - [employer] on Counter  - 1 x daily - 7 x weekly - 3 sets - 10 reps - Side Stepping with Resistance at [employer] and Counter Support  - 1 x daily - 7 x weekly - 3 sets - 10 reps - Forward Backward Monster Walk with Band at [employer] and Counter Support  - 1 x daily - 7 x weekly - 3 sets - 10 reps    GOALS: Goals reviewed with patient? Yes  SHORT TERM GOALS: Target date: 03/01/2022  Pt will be independent with initial HEP for improved strength, balance, transfers and gait.  Baseline: Not established on eval  Goal status: INITIAL  2.  Pt will improve 5 x STS to less than or equal to 15 seconds without bracing against chair or >2 instances of retropulsion to demonstrate improved functional strength and transfer efficiency.   Baseline: 14.6s without UE support, significant retropulsion and bracing against chair w/BLEs Goal status: INITIAL  3.  Pt will improve Berg score to 41/56 for decreased fall risk  Baseline: 37/56 Goal status: INITIAL  4.  Pt will perform floor transfer and LTG updated as appropriate  Baseline:  Goal  status: INITIAL   LONG TERM GOALS: Target date: 03/22/2022  Pt will be independent with final HEP for improved strength, balance, transfers and gait.  Baseline:  Goal status: INITIAL  2.  Pt will  improve Berg score to 45/56 for decreased fall risk  Baseline: 37/56 Goal status: INITIAL  3.  Pt will improve normal TUG to less than or equal to 14 seconds w/LRAD for improved functional mobility and decreased fall risk.  Baseline: 18.31s w/rollator  Goal status: INITIAL  4.  Floor transfer goal  Baseline:  Goal status: INITIAL  5.  Pt will improve gait velocity to at least 2.9 ft/s wit LRAD for improved gait efficiency  Baseline: 2.45 ft/s w/LRAD  Goal status: INITIAL  ASSESSMENT:  CLINICAL IMPRESSION: Emphasis of skilled PT session on core stability, UE/LE coordination and reaching out of BOS. Pt requires min cues throughout session due to impulsivity. Noted increased difficulty weight shifting to L side today due to R sided weakness. Pt able to maintain static balance on unlevel surfaces well but tends to rush through exercises , resulting in increased incoordination and LOB. Continue POC.    OBJECTIVE IMPAIRMENTS Abnormal gait, decreased balance, decreased coordination, decreased mobility, difficulty walking, and decreased safety awareness.   ACTIVITY LIMITATIONS lifting, standing, stairs, transfers, reach over head, and locomotion level  PARTICIPATION LIMITATIONS: cleaning, laundry, driving, shopping, community activity, and yard work  [place] Age, Fitness, Time since onset of injury/illness/exacerbation, and 1-2 comorbidities: cerebellar ataxia and history of TIA  are also affecting patient's functional outcome.   REHAB POTENTIAL: Good  CLINICAL DECISION MAKING: Evolving/moderate complexity  EVALUATION COMPLEXITY: Moderate  PLAN: PT FREQUENCY: 2x/week  PT DURATION: 6 weeks  PLANNED INTERVENTIONS: Therapeutic exercises, Therapeutic activity, Neuromuscular re-education, Balance training, Gait training, Patient/Family education, Self Care, Stair training, Vestibular training, Canalith repositioning, DME instructions, Aquatic Therapy, Manual therapy, and  Re-evaluation  PLAN FOR NEXT SESSION: Goal assessment, Retro gait, Eccentric heel taps, Plyometrics, safety w/gait and transfers (he uses SPC at home), continue to practice w/ weighted vest (I used 15#), LE coordination, floor transfer, gait with theraband, tall-kneeling?   Cruzita Lederer Reeve Mallo, PT, DPT  02/25/2022, 11:01 AM

## 2022-02-26 ENCOUNTER — Other Ambulatory Visit: Payer: Self-pay | Admitting: Internal Medicine

## 2022-02-27 ENCOUNTER — Ambulatory Visit: Payer: Medicare Other | Admitting: Physical Therapy

## 2022-02-27 DIAGNOSIS — R260 Ataxic gait: Secondary | ICD-10-CM | POA: Diagnosis not present

## 2022-02-27 DIAGNOSIS — Z9181 History of falling: Secondary | ICD-10-CM | POA: Diagnosis not present

## 2022-02-27 DIAGNOSIS — R2681 Unsteadiness on feet: Secondary | ICD-10-CM | POA: Diagnosis not present

## 2022-02-27 DIAGNOSIS — R278 Other lack of coordination: Secondary | ICD-10-CM | POA: Diagnosis not present

## 2022-02-27 NOTE — Therapy (Signed)
OUTPATIENT PHYSICAL THERAPY NEURO TREATMENT   Patient Name: Derrick White MRN: [id number] DOB:[birth year], 81 y.o., male Today's Date: 02/27/2022   PCP: Haywood Pao, MD REFERRING PROVIDER: Britt Bottom, MD    Derrick White End of Session - 02/27/22 1106     Visit Number 7    Number of Visits 13   Plus eval   Date for Derrick White Re-Evaluation 03/29/22    Authorization Type Medicare    Progress Note Due on Visit 10    Derrick White Start Time 1104    Derrick White Stop Time 1144    Derrick White Time Calculation (min) 40 min    Equipment Utilized During Treatment Gait belt    Activity Tolerance Patient tolerated treatment well    Behavior During Therapy WFL for tasks assessed/performed;Impulsive                  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   Cerebellar ataxia (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   TIA (transient ischemic attack) 11/05/2019   Worsened handwriting 03/16/2019   Past Surgical History:  Procedure Laterality Date   [place] N/A 09/13/2020   Procedure: CARDIOVERSION;  Surgeon: Sanda Klein, MD;  Location: [place];  Service: Cardiovascular;  Laterality: N/A;   FRACTURE SURGERY     Left ankle, sx x4   INGUINAL HERNIA REPAIR Right 03/05/2019   Procedure: OPEN RIGHT INGUINAL HERNIA REPAIR WITH MESH;  Surgeon: Clovis Riley, MD;  Location: [place];  Service: General;   Laterality: Right;   [place] N/A 07/27/2020   Procedure: LEFT ATRIAL APPENDAGE OCCLUSION;  Surgeon: Sherren Mocha, MD;  Location: [facility];  Service: Cardiovascular;  Laterality: N/A;   SHOULDER SURGERY Left    [place] WITHOUT CARDIOVERSION N/A 07/27/2020   Procedure: TRANSESOPHAGEAL ECHOCARDIOGRAM (TEE);  Surgeon: Sherren Mocha, MD;  Location: [facility];  Service: Cardiovascular;  Laterality: N/A;   TEE WITHOUT CARDIOVERSION N/A 09/13/2020   Procedure: TRANSESOPHAGEAL ECHOCARDIOGRAM (TEE);  Surgeon: Sanda Klein, MD;  Location: [facility];  Service: Cardiovascular;  Laterality: N/A;   TONSILLECTOMY     1945   Patient Active Problem List   Diagnosis Date Noted   Pain in right shoulder 07/31/2021   Persistent atrial fibrillation ([place]) 08/28/2020   Secondary hypercoagulable state ([place]) 07/20/2020   Paroxysmal atrial fibrillation ([place]) 06/19/2020   TIA (transient ischemic attack) 11/05/2019   Essential hypertension 11/05/2019   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   C6 radiculopathy 05/04/2019   Pain due to onychomycosis of toenails of both feet 04/28/2019   Worsened handwriting 03/16/2019   Gait disturbance 03/16/2019   Slurred speech 03/16/2019   Memory loss 03/16/2019   Pain in left ankle and joints of left  foot 08/06/2018   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   Foot pain, left 06/04/2016   Dysesthesia 06/04/2016   Superficial peroneal nerve neuropathy, 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   History of cervical spinal surgery 06/04/2016    ONSET DATE: 02/06/2022   REFERRING DIAG: R26.9 (ICD-10-CM) - Gait disturbance G11.9 (ICD-10-CM) - Cerebellar ataxia (HCC)   THERAPY DIAG:  Unsteadiness on feet  Other lack of coordination  Ataxic gait  History of falling  Rationale for Evaluation and Treatment Rehabilitation  SUBJECTIVE:                                                                                                                                                                                               SUBJECTIVE STATEMENT: Derrick White reports things are the same, doing his exercises 1x/day except on the days he has therapy. No falls.   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 NCV/EMG study did not show any evidence of motor neuron disease. We did the [employer] SCA panel (30+ genes). It was borderline. 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 No  PRECAUTIONS: Fall  FALLS: Has patient fallen in last 6 months? Yes. Number of falls around 3, mostly inside his home all in retro direction  PLOF: Independent with basic ADLs and Requires assistive device for independence  PATIENT GOALS "balance"  OBJECTIVE:   DIAGNOSTIC FINDINGS: NCV/EMG study 05/04/2019 showed the following: 1.   Chronic right C6 radiculopathy.  There could also be milder C5 and C7 chronic radiculopathies. 2.   Chronic left S1 radiculopathy. 3.   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   There was no evidence of polyneuropathy, motor neuron disease or myopathy.  COORDINATION: Heel to shin test: Dysmetria bilaterally    POSTURE: rounded shoulders, forward head,  increased thoracic kyphosis, and posterior pelvic tilt   TODAY'S TREATMENT:  Therapeutic Activity  STG Assessment   [facility] Derrick White Assessment - 02/27/22 1113       Transfers  Five time sit to stand comments  20.69s without UE support, no retropulsion noted or bracing against chair   Second atempt recorded. On first attempt, Derrick White demonstrated poor body mechanics and posterolateral LOB to R side x2. Reviewed proper technique and Derrick White able to perform better on second attempt.     Berg Balance Test   Sit to Stand Able to stand without using hands and stabilize independently    Standing Unsupported Able to stand safely 2 minutes    Sitting with Back Unsupported but Feet Supported on Floor or Stool Able to sit safely and securely 2 minutes    Stand to Sit Sits safely with minimal use of hands    Transfers Able to transfer safely, minor use of hands    Standing Unsupported with Eyes Closed Able to stand 10 seconds safely    Standing Unsupported with Feet Together Able to place feet together independently and stand 1 minute safely    From Standing, Reach Forward with Outstretched Arm Can reach forward >12 cm safely (5")    From Standing Position, Pick up Object from Floor Able to pick up shoe safely and easily    From Standing Position, Turn to Look Behind Over each Shoulder Looks behind from both sides and weight shifts well    Turn 360 Degrees Able to turn 360 degrees safely but slowly   9.69s to L, 7.69s to R   Standing Unsupported, Alternately Place Feet on Step/Stool Able to stand independently and complete 8 steps >20 seconds    Standing Unsupported, One Foot in Front Able to plae foot ahead of the other independently and hold 30 seconds    Standing on One Leg Tries to lift leg/unable to hold 3 seconds but remains standing independently    Total Score 48    Berg comment: Moderate fall risk             NMR   In // bars for improved ankle strategy, lateral weight shift, midline orientation and  vestibular input:  Fwd and retro gait without UE support, x5 reps each direction. Noted increased speed resulting in increased instability due to scissoring of RLE and poor step clearance w/LLE. Min cues to slow down and focus on proper foot placement and Derrick White able to perform well. Noted decreased stance time on LLE throughout, Derrick White reports history of foot/ankle surgery on that side  Standing on rockerboard in A/P direction without UE support. Noted both anterior and posterior LOB, no preference either way. Min A for balance correction throughout. Progressed to standing w/horizontal head turns and Derrick White consistently losing balance when turning to R side. Lastly progressed to standing with EC and Derrick White unable to hold balance for >5s prior to losing balance. Noted greater incidence in anterior LOB w/EC.   Gait pattern: step through pattern, decreased hip/knee flexion- Right, decreased hip/knee flexion- Left, ataxic, and trunk flexed Distance walked: Various clinic distances  Assistive device utilized: Walker - 4 wheeled and None Level of assistance: Modified independence and SBA Comments: Derrick White impulsive w/transfers, requiring SBA for transfers. Noted improved management of AD today compared to previous sessions. Derrick White able to walk short clinic distances without AD w/very guarded pattern.    PATIENT EDUCATION: Education details: Goal outcomes, continue HEP  Person educated: Patient Education method: Customer service manager Education comprehension: verbalized understanding   HOME EXERCISE PROGRAM: Access Code: [email or web address] URL: [email or web address] Date: 02/11/2022 Prepared by: Mickie Bail Foy Vanduyne  Exercises (using red theraband)  - Sit to Stand Without Arm Support  -  1 x daily - 7 x weekly - 3 sets - 10 reps - Step sideways with arms reaching at counter   - 1 x daily - 7 x weekly - 3 sets - 10 reps - Standing Balance Activity: Plyometric Modified Lower Extremity Jumping Jack  - 1 x daily - 7 x  weekly - 3 sets - 10 reps - [employer] on Counter  - 1 x daily - 7 x weekly - 3 sets - 10 reps - Side Stepping with Resistance at [employer] and Counter Support  - 1 x daily - 7 x weekly - 3 sets - 10 reps - Forward Backward Monster Walk with Band at [employer] and Counter Support  - 1 x daily - 7 x weekly - 3 sets - 10 reps    GOALS: Goals reviewed with patient? Yes  SHORT TERM GOALS: Target date: 03/01/2022  Derrick White will be independent with initial HEP for improved strength, balance, transfers and gait.  Baseline: Not established on eval  Goal status: MET  2.  Derrick White will improve 5 x STS to less than or equal to 15 seconds without bracing against chair or >2 instances of retropulsion to demonstrate improved functional strength and transfer efficiency.   Baseline: 14.6s without UE support, significant retropulsion and bracing against chair w/BLEs; 20.69s without UE support  Goal status: IN PROGRESS  3.  Derrick White will improve Berg score to 41/56 for decreased fall risk  Baseline: 37/56; 48/56  Goal status: MET  4.  Derrick White will perform floor transfer and LTG updated as appropriate  Baseline:  Goal status: MET   LONG TERM GOALS: Target date: 03/22/2022  Derrick White will be independent with final HEP for improved strength, balance, transfers and gait.  Baseline:  Goal status: INITIAL  2.  Derrick White will improve Berg score to 45/56 for decreased fall risk  Baseline: 37/56; 48/56  Goal status: MET  3.  Derrick White will improve normal TUG to less than or equal to 14 seconds w/LRAD for improved functional mobility and decreased fall risk.  Baseline: 18.31s w/rollator  Goal status: INITIAL  4.  Floor transfer goal  Baseline:  Goal status: INITIAL  5.  Derrick White will improve gait velocity to at least 2.9 ft/s wit LRAD for improved gait efficiency  Baseline: 2.45 ft/s w/LRAD  Goal status: INITIAL  6. Derrick White will improve 5 x STS to less than or equal to 15 seconds without UE support to demonstrate improved functional strength  and transfer efficiency.  Baseline: 20.69s without UE support on 10/11  Goal Status: INITIAL    ASSESSMENT:  CLINICAL IMPRESSION: Emphasis of skilled Derrick White session on STG assessment, ankle strategy and vestibular input. Derrick White has met 3/4 STGs, not meeting his 5x STS goal due to continued difficulty w/retropulsion and immediate standing balance. Derrick White has improved his Berg score by 11 points from eval, indicative of improved standing balance and reduced fall risk. Derrick White continues to have difficulty w/turns and single leg stability. Derrick White unable to hold balance on rockerboard without UE support due to poor righting reactions. Continue POC.    OBJECTIVE IMPAIRMENTS Abnormal gait, decreased balance, decreased coordination, decreased mobility, difficulty walking, and decreased safety awareness.   ACTIVITY LIMITATIONS lifting, standing, stairs, transfers, reach over head, and locomotion level  PARTICIPATION LIMITATIONS: cleaning, laundry, driving, shopping, community activity, and yard work  Fowler Age, Fitness, Time since onset of injury/illness/exacerbation, and 1-2 comorbidities: cerebellar ataxia and history of TIA  are also affecting patient's functional outcome.   REHAB POTENTIAL:  Good  CLINICAL DECISION MAKING: Evolving/moderate complexity  EVALUATION COMPLEXITY: Moderate  PLAN: Derrick White FREQUENCY: 2x/week  Derrick White DURATION: 6 weeks  PLANNED INTERVENTIONS: Therapeutic exercises, Therapeutic activity, Neuromuscular re-education, Balance training, Gait training, Patient/Family education, Self Care, Stair training, Vestibular training, Canalith repositioning, DME instructions, Aquatic Therapy, Manual therapy, and Re-evaluation  PLAN FOR NEXT SESSION: Retro gait, Eccentric heel taps, Plyometrics, safety w/gait and transfers (he uses SPC at home), continue to practice w/ weighted vest (I used 15#), LE coordination, floor transfer, gait with theraband, tall-kneeling? Rockerboard and unlevel surfaces,  turns    Cruzita Lederer Leotis Isham, Derrick White, DPT  02/27/2022, 11:45 AM

## 2022-03-08 ENCOUNTER — Ambulatory Visit: Payer: Medicare Other | Admitting: Physical Therapy

## 2022-03-08 DIAGNOSIS — R260 Ataxic gait: Secondary | ICD-10-CM

## 2022-03-08 DIAGNOSIS — R2681 Unsteadiness on feet: Secondary | ICD-10-CM

## 2022-03-08 DIAGNOSIS — R278 Other lack of coordination: Secondary | ICD-10-CM

## 2022-03-08 DIAGNOSIS — Z9181 History of falling: Secondary | ICD-10-CM | POA: Diagnosis not present

## 2022-03-08 NOTE — Therapy (Signed)
OUTPATIENT PHYSICAL THERAPY NEURO TREATMENT   Patient Name: Derrick White MRN: [id number] DOB:March 19, 1941, 81 y.o., male Today's Date: 03/08/2022   PCP: Haywood Pao, MD REFERRING PROVIDER: Britt Bottom, MD    PT End of Session - 03/08/22 1103     Visit Number 8    Number of Visits 13   Plus eval   Date for PT Re-Evaluation 03/29/22    Authorization Type Medicare    Progress Note Due on Visit 10    PT Start Time 1102    PT Stop Time 1144    PT Time Calculation (min) 42 min    Equipment Utilized During Treatment Gait belt    Activity Tolerance Patient tolerated treatment well    Behavior During Therapy WFL for tasks assessed/performed;Impulsive                   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   Cerebellar ataxia ([place])    Dysesthesia 06/04/2016   Essential hypertension 11/05/2019   Foot pain, left 06/04/2016   Gait disturbance 03/16/2019   History of cervical spinal surgery 06/04/2016   History of hiatal hernia    History of kidney stones    Hypertension    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   Memory loss 03/16/2019   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   Pain due to onychomycosis of toenails of both feet 04/28/2019   Pain in left ankle and joints of left foot 08/06/2018   Slurred speech 03/16/2019   Superficial peroneal nerve neuropathy, 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   TIA (transient ischemic attack) 11/05/2019   Worsened handwriting 03/16/2019   Past Surgical History:  Procedure Laterality Date   [place] N/A 09/13/2020   Procedure: CARDIOVERSION;  Surgeon: Sanda Klein, MD;  Location: [place];  Service: Cardiovascular;  Laterality: N/A;   FRACTURE SURGERY     Left ankle, sx x4   INGUINAL HERNIA REPAIR Right 03/05/2019   Procedure: OPEN RIGHT INGUINAL HERNIA REPAIR WITH MESH;  Surgeon: Clovis Riley, MD;  Location: [place];  Service: General;   Laterality: Right;   [place] N/A 07/27/2020   Procedure: LEFT ATRIAL APPENDAGE OCCLUSION;  Surgeon: Sherren Mocha, MD;  Location: [place] [facility];  Service: Cardiovascular;  Laterality: N/A;   SHOULDER SURGERY Left    [place] WITHOUT CARDIOVERSION N/A 07/27/2020   Procedure: TRANSESOPHAGEAL ECHOCARDIOGRAM (TEE);  Surgeon: Sherren Mocha, MD;  Location: [place] [facility];  Service: Cardiovascular;  Laterality: N/A;   [place] WITHOUT CARDIOVERSION N/A 09/13/2020   Procedure: TRANSESOPHAGEAL ECHOCARDIOGRAM (TEE);  Surgeon: Sanda Klein, MD;  Location: [facility];  Service: Cardiovascular;  Laterality: N/A;   TONSILLECTOMY     1945   Patient Active Problem List   Diagnosis Date Noted   Pain in right shoulder 07/31/2021   Persistent atrial fibrillation ([place]) 08/28/2020   Secondary hypercoagulable state ([place]) 07/20/2020   Paroxysmal atrial fibrillation ([place]) 06/19/2020   TIA (transient ischemic attack) 11/05/2019   Essential hypertension 11/05/2019   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/05/2019   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   C6 radiculopathy 05/04/2019   Pain due to onychomycosis of toenails of both feet 04/28/2019   Worsened handwriting 03/16/2019   Gait disturbance 03/16/2019   Slurred speech 03/16/2019   Memory loss 03/16/2019   Pain in left ankle and joints of  left foot 08/06/2018   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   Foot pain, left 06/04/2016   Dysesthesia 06/04/2016   Superficial peroneal nerve neuropathy, 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   History of cervical spinal surgery 06/04/2016    ONSET DATE: 02/06/2022   REFERRING DIAG: R26.9 (ICD-10-CM) - Gait disturbance G11.9 (ICD-10-CM) - Cerebellar ataxia (HCC)   THERAPY DIAG:  Unsteadiness on feet  Ataxic gait  Other lack of coordination  Rationale for Evaluation and Treatment Rehabilitation  SUBJECTIVE:                                                                                                                                                                                               SUBJECTIVE STATEMENT: Pt reports his trip went well, no falls but he had a few near misses due to not using his walker inside the house. Exercises are going well, he is able to do 2 stance jacks now.   Pt accompanied by: self   PERTINENT HISTORY:  C6 radiculopathy, Cerebellar ataxia ([place]), Dysesthesia, Essential hypertension, Foot pain (left), Gait disturbance, History of cervical spinal surgery (06/04/2016), History of hiatal hernia, History of kidney stones, 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, Memory loss, Mild cognitive impairment, Pain due to onychomycosis of toenails of both feet, Pain in left ankle and joints of left foot, Slurred speech, Superficial peroneal nerve neuropathy, left (06/04/2009),TIA (transient ischemic attack) 11/05/2019. NCV/EMG study did not show any evidence of motor neuron disease. We did the Athena SCA panel (30+ genes). It was borderline. 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 Unclear if this is playing a role (SCA type 6 has mutations in this [place] but usually triplet repeat)   PAIN:  Are you having pain? No  PRECAUTIONS: Fall  FALLS: Has patient fallen in last 6 months? Yes. Number of falls around 3, mostly inside his home all in retro direction  PLOF: Independent with basic ADLs and Requires assistive device for independence  PATIENT GOALS "balance"  OBJECTIVE:   DIAGNOSTIC FINDINGS: NCV/EMG study 05/04/2019 showed the following: 1.   Chronic right C6 radiculopathy.  There could also be milder C5 and C7 chronic radiculopathies. 2.   Chronic left S1 radiculopathy. 3.   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   There was no evidence of polyneuropathy, motor neuron disease or myopathy.  COORDINATION: Heel to shin test: Dysmetria  bilaterally    POSTURE: rounded shoulders, forward head, increased thoracic kyphosis, and posterior pelvic tilt   TODAY'S TREATMENT:   Ther Ex  SciFit multi-peaks  level 8 for 8 minutes using BUE/BLEs for neural priming for reciprocal movement, dynamic cardiovascular warmup and increased amplitude of stepping.   NMR  Alt step downs from 6" step w/no UE support for improved single leg stability, LE coordination and stability w/retro gait. Mod verbal cues for proper form and to slow down, as pt very impulsive and moves so rapidly that his feet were not placed properly on step, resulting in posterior LOB. CGA-min A throughout for safety. Noted increased difficulty stepping w/LLE > RLE  On trampoline w/BUE support for high amplitude movement, LE coordination and increased step clearance:   Jumping in place, x20 jumps. Pt unable to maintain feet position hip width apart or jump feet off trampoline. Min cues to facilitate hip/knee flexion for jump, but pt unable to sequence.  Stance jacks on trampoline, x20 jumps. Alt forward step jumps, x10 per side. Noted decreased step clearance w/LLE > RLE.   Pt ambulated 115' while holding 15# surge for core stability, lateral weight shifting and endurance. CGA throughout. Noted very small, shuffling steps, narrow BOS and decreased step length w/LLE > RLE.    Gait pattern: step through pattern, decreased hip/knee flexion- Right, decreased hip/knee flexion- Left, ataxic, and trunk flexed Distance walked: Various clinic distances  Assistive device utilized: Walker - 4 wheeled and None Level of assistance: Modified independence and SBA Comments: Pt impulsive w/transfers, requiring SBA and min cues to slow down for safety    PATIENT EDUCATION: Education details: Continue HEP Person educated: Patient Education method: Customer service manager Education comprehension: verbalized understanding   HOME EXERCISE PROGRAM: Access Code: [email or web address] URL:  [email or web address] Date: 02/11/2022 Prepared by: Mickie Bail Howell [place]  Exercises (using red theraband)  - Sit to Stand Without Arm Support  - 1 x daily - 7 x weekly - 3 sets - 10 reps - Step sideways with arms reaching at counter   - 1 x daily - 7 x weekly - 3 sets - 10 reps - Standing Balance Activity: Plyometric Modified Lower Extremity Jumping Jack  - 1 x daily - 7 x weekly - 3 sets - 10 reps - [employer] on Counter  - 1 x daily - 7 x weekly - 3 sets - 10 reps - Side Stepping with Resistance at [employer] and Counter Support  - 1 x daily - 7 x weekly - 3 sets - 10 reps - Forward Backward Monster Walk with Band at [employer] and Counter Support  - 1 x daily - 7 x weekly - 3 sets - 10 reps    GOALS: Goals reviewed with patient? Yes  SHORT TERM GOALS: Target date: 03/01/2022  Pt will be independent with initial HEP for improved strength, balance, transfers and gait.  Baseline: Not established on eval  Goal status: MET  2.  Pt will improve 5 x STS to less than or equal to 15 seconds without bracing against chair or >2 instances of retropulsion to demonstrate improved functional strength and transfer efficiency.   Baseline: 14.6s without UE support, significant retropulsion and bracing against chair w/BLEs; 20.69s without UE support  Goal status: IN PROGRESS  3.  Pt will improve Berg score to 41/56 for decreased fall risk  Baseline: 37/56; 48/56  Goal status: MET  4.  Pt will perform floor transfer and LTG updated as appropriate  Baseline:  Goal status: MET   LONG TERM GOALS: Target date: 03/22/2022  Pt will be independent with final HEP for improved strength, balance, transfers and gait.  Baseline:  Goal  status: INITIAL  2.  Pt will improve Berg score to 45/56 for decreased fall risk  Baseline: 37/56; 48/56  Goal status: MET  3.  Pt will improve normal TUG to less than or equal to 14 seconds w/LRAD for improved functional mobility and decreased fall  risk.  Baseline: 18.31s w/rollator  Goal status: INITIAL  4.  Floor transfer goal  Baseline:  Goal status: INITIAL  5.  Pt will improve gait velocity to at least 2.9 ft/s wit LRAD for improved gait efficiency  Baseline: 2.45 ft/s w/LRAD  Goal status: INITIAL  6. Pt will improve 5 x STS to less than or equal to 15 seconds without UE support to demonstrate improved functional strength and transfer efficiency.  Baseline: 20.69s without UE support on 10/11  Goal Status: INITIAL    ASSESSMENT:  CLINICAL IMPRESSION: Emphasis of skilled PT session on LE coordination, core stability and proper foot placement. Pt continues to have difficulty w/sequencing jumping, even on trampoline. Pt also very impulsive and requires cues to slow down for improved foot placement and safety w/transfers. Continue POC.     OBJECTIVE IMPAIRMENTS Abnormal gait, decreased balance, decreased coordination, decreased mobility, difficulty walking, and decreased safety awareness.   ACTIVITY LIMITATIONS lifting, standing, stairs, transfers, reach over head, and locomotion level  PARTICIPATION LIMITATIONS: cleaning, laundry, driving, shopping, community activity, and yard work  [place] Age, Fitness, Time since onset of injury/illness/exacerbation, and 1-2 comorbidities: cerebellar ataxia and history of TIA  are also affecting patient's functional outcome.   REHAB POTENTIAL: Good  CLINICAL DECISION MAKING: Evolving/moderate complexity  EVALUATION COMPLEXITY: Moderate  PLAN: PT FREQUENCY: 2x/week  PT DURATION: 6 weeks  PLANNED INTERVENTIONS: Therapeutic exercises, Therapeutic activity, Neuromuscular re-education, Balance training, Gait training, Patient/Family education, Self Care, Stair training, Vestibular training, Canalith repositioning, DME instructions, Aquatic Therapy, Manual therapy, and Re-evaluation  PLAN FOR NEXT SESSION: Retro gait, Eccentric heel taps, Plyometrics, safety w/gait and  transfers (he uses SPC at home), continue to practice w/ weighted vest (I used 15#), LE coordination, floor transfer, gait with theraband, tall-kneeling? Rockerboard and unlevel surfaces, turns, update HEP   Makiyah Zentz E Seraj Dunnam, PT, DPT  03/08/2022, 11:47 AM

## 2022-03-11 ENCOUNTER — Ambulatory Visit: Payer: Medicare Other | Admitting: Physical Therapy

## 2022-03-11 DIAGNOSIS — R278 Other lack of coordination: Secondary | ICD-10-CM | POA: Diagnosis not present

## 2022-03-11 DIAGNOSIS — R2681 Unsteadiness on feet: Secondary | ICD-10-CM | POA: Diagnosis not present

## 2022-03-11 DIAGNOSIS — Z9181 History of falling: Secondary | ICD-10-CM | POA: Diagnosis not present

## 2022-03-11 DIAGNOSIS — R260 Ataxic gait: Secondary | ICD-10-CM | POA: Diagnosis not present

## 2022-03-11 NOTE — Therapy (Signed)
OUTPATIENT PHYSICAL THERAPY NEURO TREATMENT   Patient Name: Derrick White MRN: [id number] DOB:May 03, 1941, 81 y.o., male Today's Date: 03/11/2022   PCP: Haywood Pao, MD REFERRING PROVIDER: Britt Bottom, MD    PT End of Session - 03/11/22 1019     Visit Number 9    Number of Visits 13   Plus eval   Date for PT Re-Evaluation 03/29/22    Authorization Type Medicare    Progress Note Due on Visit 10    PT Start Time 1017    PT Stop Time 1101    PT Time Calculation (min) 44 min    Equipment Utilized During Treatment Gait belt    Activity Tolerance Patient tolerated treatment well    Behavior During Therapy WFL for tasks assessed/performed;Impulsive                    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   Cerebellar ataxia ([place])    Dysesthesia 06/04/2016   Essential hypertension 11/05/2019   Foot pain, left 06/04/2016   Gait disturbance 03/16/2019   History of cervical spinal surgery 06/04/2016   History of hiatal hernia    History of kidney stones    Hypertension    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   Memory loss 03/16/2019   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   Pain due to onychomycosis of toenails of both feet 04/28/2019   Pain in left ankle and joints of left foot 08/06/2018   Slurred speech 03/16/2019   Superficial peroneal nerve neuropathy, 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   TIA (transient ischemic attack) 11/05/2019   Worsened handwriting 03/16/2019   Past Surgical History:  Procedure Laterality Date   [place] N/A 09/13/2020   Procedure: CARDIOVERSION;  Surgeon: Sanda Klein, MD;  Location: [place];  Service: Cardiovascular;  Laterality: N/A;   FRACTURE SURGERY     Left ankle, sx x4   INGUINAL HERNIA REPAIR Right 03/05/2019   Procedure: OPEN RIGHT INGUINAL HERNIA REPAIR WITH MESH;  Surgeon: Clovis Riley, MD;  Location: [place];  Service: General;   Laterality: Right;   [place] N/A 07/27/2020   Procedure: LEFT ATRIAL APPENDAGE OCCLUSION;  Surgeon: Sherren Mocha, MD;  Location: [place] [facility];  Service: Cardiovascular;  Laterality: N/A;   SHOULDER SURGERY Left    TEE WITHOUT CARDIOVERSION N/A 07/27/2020   Procedure: TRANSESOPHAGEAL ECHOCARDIOGRAM (TEE);  Surgeon: Sherren Mocha, MD;  Location: [place] [facility];  Service: Cardiovascular;  Laterality: N/A;   [place] WITHOUT CARDIOVERSION N/A 09/13/2020   Procedure: TRANSESOPHAGEAL ECHOCARDIOGRAM (TEE);  Surgeon: Sanda Klein, MD;  Location: [facility];  Service: Cardiovascular;  Laterality: N/A;   TONSILLECTOMY     1945   Patient Active Problem List   Diagnosis Date Noted   Pain in right shoulder 07/31/2021   Persistent atrial fibrillation ([place]) 08/28/2020   Secondary hypercoagulable state ([place]) 07/20/2020   Paroxysmal atrial fibrillation ([place]) 06/19/2020   TIA (transient ischemic attack) 11/05/2019   Essential hypertension 11/05/2019   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   C6 radiculopathy 05/04/2019   Pain due to onychomycosis of toenails of both feet 04/28/2019   Worsened handwriting 03/16/2019   Gait disturbance 03/16/2019   Slurred speech 03/16/2019   Memory loss 03/16/2019   Pain in left ankle and joints  of left foot 08/06/2018   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   Foot pain, left 06/04/2016   Dysesthesia 06/04/2016   Superficial peroneal nerve neuropathy, 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   History of cervical spinal surgery 06/04/2016    ONSET DATE: 02/06/2022   REFERRING DIAG: R26.9 (ICD-10-CM) - Gait disturbance G11.9 (ICD-10-CM) - Cerebellar ataxia (HCC)   THERAPY DIAG:  Unsteadiness on feet  Other lack of coordination  Ataxic gait  Rationale for Evaluation and Treatment Rehabilitation  SUBJECTIVE:                                                                                                                                                                                               SUBJECTIVE STATEMENT: Pt reports doing well, still having difficulty w/jumps but otherwise going well. "No pain, no gain". No falls   Pt accompanied by: self   PERTINENT HISTORY:  C6 radiculopathy, Cerebellar ataxia (HCC), Dysesthesia, Essential hypertension, Foot pain (left), Gait disturbance, History of cervical spinal surgery (06/04/2016), History of hiatal hernia, History of kidney stones, 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, Memory loss, Mild cognitive impairment, Pain due to onychomycosis of toenails of both feet, Pain in left ankle and joints of left foot, Slurred speech, Superficial peroneal nerve neuropathy, left (06/04/2009),TIA (transient ischemic attack) 11/05/2019. NCV/EMG study did not show any evidence of motor neuron disease. We did the Athena SCA panel (30+ genes). It was borderline. 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 Unclear if this is playing a role (SCA type 6 has mutations in this [place] but usually triplet repeat)   PAIN:  Are you having pain? No  PRECAUTIONS: Fall  FALLS: Has patient fallen in last 6 months? Yes. Number of falls around 3, mostly inside his home all in retro direction  PLOF: Independent with basic ADLs and Requires assistive device for independence  PATIENT GOALS "balance"  OBJECTIVE:   DIAGNOSTIC FINDINGS: NCV/EMG study 05/04/2019 showed the following: 1.   Chronic right C6 radiculopathy.  There could also be milder C5 and C7 chronic radiculopathies. 2.   Chronic left S1 radiculopathy. 3.   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   There was no evidence of polyneuropathy, motor neuron disease or myopathy.  COORDINATION: Heel to shin test: Dysmetria bilaterally    POSTURE: rounded shoulders, forward head, increased  thoracic kyphosis, and posterior pelvic tilt   TODAY'S TREATMENT:   NMR  Supine glute bridges w/BLEs elevated on red theraball, x20 for improved core stability and posterior chain strength. Min cues to slow  down movement for improved core strength and smooth movement. Started w/ball under B knees and progressed to having ball under feet. Pt w/increased difficulty stabilizing w/ball under feet > knees. Min A for intermittent stabilization of ball  Quadruped bird dogs on top on red theraball, x12 per side, for improved UE/LE coordination, posterior chain strength and core stability. Noted increased difficulty kicking back w/RLE > LLE. No LOB or assistance required throughout  While seated on small blue theraball for improved core stability, lateral reaching and LE coordination:  Lateral cone reaches, x9 cones per side. Noted increased difficulty reaching to R side > L side. Min A for stabilization of ball and anterolateral LOB to R side x1  Alt step taps to 4" step, x12 per side. Pt w/increased difficulty maintaining balance when tapping w/LLE due to lateral lean to R side. Min cues for increased step clearance and to slow down.   Ther Ex  SciFit multi-peaks level 6 for 8 minutes using BUE/BLEs for neural priming for reciprocal movement, dynamic cardiovascular warmup and increased amplitude of stepping.    Gait pattern: step through pattern, decreased hip/knee flexion- Right, decreased hip/knee flexion- Left, ataxic, and trunk flexed Distance walked: Various clinic distances  Assistive device utilized: Walker - 4 wheeled and None Level of assistance: Modified independence and SBA Comments: Pt impulsive w/transfers, requiring SBA and min cues to slow down for safety    PATIENT EDUCATION: Education details: Continue HEP Person educated: Patient Education method: Customer service manager Education comprehension: verbalized understanding   HOME EXERCISE PROGRAM: Access Code:  [email or web address] URL: [email or web address] Date: 02/11/2022 Prepared by: Mickie Bail Ambriana [place]  Exercises (using red theraband)  - Sit to Stand Without Arm Support  - 1 x daily - 7 x weekly - 3 sets - 10 reps - Step sideways with arms reaching at counter   - 1 x daily - 7 x weekly - 3 sets - 10 reps - Standing Balance Activity: Plyometric Modified Lower Extremity Jumping Jack  - 1 x daily - 7 x weekly - 3 sets - 10 reps - [employer] on Counter  - 1 x daily - 7 x weekly - 3 sets - 10 reps - Side Stepping with Resistance at [employer] and Counter Support  - 1 x daily - 7 x weekly - 3 sets - 10 reps - Forward Backward Monster Walk with Band at [employer] and Counter Support  - 1 x daily - 7 x weekly - 3 sets - 10 reps    GOALS: Goals reviewed with patient? Yes  SHORT TERM GOALS: Target date: 03/01/2022  Pt will be independent with initial HEP for improved strength, balance, transfers and gait.  Baseline: Not established on eval  Goal status: MET  2.  Pt will improve 5 x STS to less than or equal to 15 seconds without bracing against chair or >2 instances of retropulsion to demonstrate improved functional strength and transfer efficiency.   Baseline: 14.6s without UE support, significant retropulsion and bracing against chair w/BLEs; 20.69s without UE support  Goal status: IN PROGRESS  3.  Pt will improve Berg score to 41/56 for decreased fall risk  Baseline: 37/56; 48/56  Goal status: MET  4.  Pt will perform floor transfer and LTG updated as appropriate  Baseline:  Goal status: MET   LONG TERM GOALS: Target date: 03/22/2022  Pt will be independent with final HEP for improved strength, balance, transfers and gait.  Baseline:  Goal status: INITIAL  2.  Pt will improve  Berg score to 45/56 for decreased fall risk  Baseline: 37/56; 48/56  Goal status: MET  3.  Pt will improve normal TUG to less than or equal to 14 seconds w/LRAD for improved functional mobility and decreased  fall risk.  Baseline: 18.31s w/rollator  Goal status: INITIAL  4.  Floor transfer goal  Baseline:  Goal status: INITIAL  5.  Pt will improve gait velocity to at least 2.9 ft/s wit LRAD for improved gait efficiency  Baseline: 2.45 ft/s w/LRAD  Goal status: INITIAL  6. Pt will improve 5 x STS to less than or equal to 15 seconds without UE support to demonstrate improved functional strength and transfer efficiency.  Baseline: 20.69s without UE support on 10/11  Goal Status: INITIAL    ASSESSMENT:  CLINICAL IMPRESSION: Emphasis of skilled PT session on UE/LE coordination, core stability and reaching out of BOS. Pt tolerated session well but continues to require frequent verbal cues to slow down and reduce impulsive behavior (starting activity prior to therapist being ready). Pt demonstrates increased difficulty shifting weight to R side and stabilizing w/R truncal lean. Continue POC.    OBJECTIVE IMPAIRMENTS Abnormal gait, decreased balance, decreased coordination, decreased mobility, difficulty walking, and decreased safety awareness.   ACTIVITY LIMITATIONS lifting, standing, stairs, transfers, reach over head, and locomotion level  PARTICIPATION LIMITATIONS: cleaning, laundry, driving, shopping, community activity, and yard work  [place] Age, Fitness, Time since onset of injury/illness/exacerbation, and 1-2 comorbidities: cerebellar ataxia and history of TIA  are also affecting patient's functional outcome.   REHAB POTENTIAL: Good  CLINICAL DECISION MAKING: Evolving/moderate complexity  EVALUATION COMPLEXITY: Moderate  PLAN: PT FREQUENCY: 2x/week  PT DURATION: 6 weeks  PLANNED INTERVENTIONS: Therapeutic exercises, Therapeutic activity, Neuromuscular re-education, Balance training, Gait training, Patient/Family education, Self Care, Stair training, Vestibular training, Canalith repositioning, DME instructions, Aquatic Therapy, Manual therapy, and  Re-evaluation  PLAN FOR NEXT SESSION: 10th Visit PN, Retro gait, Eccentric heel taps, Plyometrics, safety w/gait and transfers (he uses SPC at home), continue to practice w/ weighted vest (I used 15#), LE coordination, floor transfer, gait with theraband, tall-kneeling? Rockerboard and unlevel surfaces, turns, update HEP   Yamilett Anastos E Nessie Nong, PT, DPT  03/11/2022, 11:02 AM

## 2022-03-18 ENCOUNTER — Ambulatory Visit: Payer: Medicare Other | Admitting: Physical Therapy

## 2022-03-18 DIAGNOSIS — R260 Ataxic gait: Secondary | ICD-10-CM | POA: Diagnosis not present

## 2022-03-18 DIAGNOSIS — Z9181 History of falling: Secondary | ICD-10-CM | POA: Diagnosis not present

## 2022-03-18 DIAGNOSIS — R2681 Unsteadiness on feet: Secondary | ICD-10-CM

## 2022-03-18 DIAGNOSIS — R278 Other lack of coordination: Secondary | ICD-10-CM | POA: Diagnosis not present

## 2022-03-18 NOTE — Therapy (Addendum)
OUTPATIENT PHYSICAL THERAPY NEURO TREATMENT- 10TH VISIT PROGRESS NOTE   Patient Name: Derrick White MRN: [id number] DOB:1941-04-03, 81 y.o., male Today's Date: 03/18/2022   PCP: Haywood Pao, MD REFERRING PROVIDER: Britt Bottom, MD   Physical Therapy Progress Note   Dates of Reporting Period:02/08/22 - 03/18/22  See Note below for Objective Data and Assessment of Progress/Goals.  Thank you for the referral of this patient. Mickie Bail Plaster, PT, DPT    PT End of Session - 03/18/22 1101     Visit Number 10    Number of Visits 13    Date for PT Re-Evaluation 03/29/22    Authorization Type Medicare    Progress Note Due on Visit 10    PT Start Time 1100    PT Stop Time 1145    PT Time Calculation (min) 45 min    Equipment Utilized During Treatment Gait belt    Activity Tolerance Patient tolerated treatment well    Behavior During Therapy WFL for tasks assessed/performed;Impulsive                    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   Cerebellar ataxia ([place])    Dysesthesia 06/04/2016   Essential hypertension 11/05/2019   Foot pain, left 06/04/2016   Gait disturbance 03/16/2019   History of cervical spinal surgery 06/04/2016   History of hiatal hernia    History of kidney stones    Hypertension    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   Memory loss 03/16/2019   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   Pain due to onychomycosis of toenails of both feet 04/28/2019   Pain in left ankle and joints of left foot 08/06/2018   Slurred speech 03/16/2019   Superficial peroneal nerve neuropathy, 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   TIA (transient ischemic attack) 11/05/2019   Worsened handwriting 03/16/2019   Past Surgical History:  Procedure Laterality Date   [place] N/A 09/13/2020   Procedure: CARDIOVERSION;  Surgeon: Sanda Klein, MD;  Location: [place];  Service:  Cardiovascular;  Laterality: N/A;   FRACTURE SURGERY     Left ankle, sx x4   INGUINAL HERNIA REPAIR Right 03/05/2019   Procedure: OPEN RIGHT INGUINAL HERNIA REPAIR WITH MESH;  Surgeon: Clovis Riley, MD;  Location: [place];  Service: General;  Laterality: Right;   [place] N/A 07/27/2020   Procedure: LEFT ATRIAL APPENDAGE OCCLUSION;  Surgeon: Sherren Mocha, MD;  Location: [place] [facility];  Service: Cardiovascular;  Laterality: N/A;   SHOULDER SURGERY Left    TEE WITHOUT CARDIOVERSION N/A 07/27/2020   Procedure: TRANSESOPHAGEAL ECHOCARDIOGRAM (TEE);  Surgeon: Sherren Mocha, MD;  Location: [place] [facility];  Service: Cardiovascular;  Laterality: N/A;   TE[place] WITHOUT CARDIOVERSION N/A 09/13/2020   Procedure: TRANSESOPHAGEAL ECHOCARDIOGRAM (TEE);  Surgeon: Sanda Klein, MD;  Location[facility];  Service: Cardiovascular;  Laterality: N/A;   TONSILLECTOMY     1945   Patient Active Problem List   Diagnosis Date Noted   Pain in right shoulder 07/31/2021   Persistent atrial fibrillation ([place]) 08/28/2020   Secondary hypercoagulable state ([place]) 07/20/2020   Paroxysmal atrial fibrillation ([place]) 06/19/2020   TIA (transient ischemic attack) 11/05/2019   Essential hypertension 11/05/2019   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/05/2019   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   C6 radiculopathy 05/04/2019  Pain due to onychomycosis of toenails of both feet 04/28/2019   Worsened handwriting 03/16/2019   Gait disturbance 03/16/2019   Slurred speech 03/16/2019   Memory loss 03/16/2019   Pain in left ankle and joints of left foot 08/06/2018   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   Foot pain, left 06/04/2016   Dysesthesia 06/04/2016   Superficial peroneal nerve neuropathy, 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  No falls since last visit.  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 NCV/EMG study did not show any evidence of motor neuron disease. We did the Athena SCA panel (30+ genes). It was borderline. 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 Unclear if this is playing a role (SCA type 6 has mutations in this [place] but usually triplet repeat)   PAIN:  Are you having pain? No  PRECAUTIONS: Fall  FALLS: Has patient fallen in last 6 months? Yes. Number of falls around 3, mostly inside his home all in retro direction  PLOF: Independent with basic ADLs and Requires assistive device for independence  PATIENT GOALS "balance"  OBJECTIVE:   DIAGNOSTIC FINDINGS: NCV/EMG study 05/04/2019 showed the following: 1.   Chronic right C6 radiculopathy.  There could also be milder C5 and C7 chronic radiculopathies. 2.   Chronic left S1 radiculopathy. 3.   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    Possible left superficial  peroneal sensory neuropathy  5.   There was no evidence of polyneuropathy, motor neuron disease or myopathy.  COORDINATION: Heel to shin test: Dysmetria bilaterally    POSTURE: rounded shoulders, forward head, increased  thoracic kyphosis, and posterior pelvic tilt   TODAY'S TREATMENT:     [facility] Adult PT Treatment/Exercise - 03/18/22 0001       Ambulation/Gait   Ambulation/Gait Yes    Ambulation/Gait Assistance 5: Supervision    Ambulation/Gait Assistance Details cool down after elliptical.  progressed with household dynamic gait with SPC, see balance section.      Knee/Hip Exercises: Aerobic   Elliptical 76mn tota: 3 min FW, 3 min BW , 2 min FW ;    RPM 22,  for strengthening, and coordination from LEs flexion <> extension.          Balance Exercises - 03/18/22 0001       Balance Exercises: Standing   Standing, One Foot on a Step Eyes open;4 inch   progressing with upright gaze, alt. toe taps, standing in corner, required intermittent UE support of wall.   Retro Gait 4 reps   with SPC   Sidestepping 4 reps   with SPC tapping cone with Alt. feet.   Other Standing Exercises multi level reaching from floor with feet even progressing to feet staggered stance, no AD with supervison to intermittent CGA.          PATIENT EDUCATION: Education details: Continue HEP Person educated: Patient Education method: ECustomer service managerEducation comprehension: verbalized understanding   HOME EXERCISE PROGRAM: Access Code: [email or web address]: [email or web address] Date: 02/11/2022 Prepared by: JMickie BailPlaster  Exercises (using red theraband)  - Sit to Stand Without Arm Support  - 1 x daily - 7 x weekly - 3 sets - 10 reps - Step sideways with arms reaching at counter   - 1 x daily - 7 x weekly - 3 sets - 10 reps - Standing Balance Activity: Plyometric Modified Lower Extremity Jumping Jack  - 1 x daily - 7 x weekly - 3 sets - 10 reps - MStarbucks Corporationon Counter  - 1  x daily - 7 x weekly - 3 sets - 10 reps - Side Stepping with Resistance at TSun Microsystemsand Counter Support  - 1 x daily - 7 x weekly - 3 sets - 10 reps - Forward Backward Monster Walk with Band at TSun Microsystemsand Counter Support  - 1 x daily - 7 x weekly - 3 sets - 10 reps    GOALS: Goals reviewed with patient? Yes  SHORT TERM GOALS: Target date: 03/01/2022  Pt will be independent with initial HEP for improved strength, balance, transfers and gait.  Baseline: Not established on eval  Goal status: MET  2.  Pt will improve 5 x STS to less than or equal to 15 seconds without bracing against chair or >2 instances of retropulsion to demonstrate improved functional strength and transfer efficiency.   Baseline: 14.6s without UE support, significant retropulsion and bracing against chair w/BLEs; 20.69s without UE support  Goal status: IN PROGRESS  3.  Pt will improve Berg score to 41/56 for decreased fall risk  Baseline: 37/56; 48/56  Goal status: MET  4.  Pt will perform floor transfer and LTG updated as appropriate  Baseline:  Goal status: MET   LONG TERM GOALS: Target date: 03/22/2022  Pt will be independent with final HEP for improved strength, balance, transfers and gait.  Baseline:  Goal status: INITIAL  2.  Pt will improve Berg score to 45/56 for decreased fall risk  Baseline: 37/56; 48/56  Goal status: MET  3.  Pt will improve normal TUG to less than or equal to 14 seconds w/LRAD  for improved functional mobility and decreased fall risk.  Baseline: 18.31s w/rollator  Goal status: INITIAL  4.  Floor transfer goal  Baseline:  Goal status: INITIAL  5.  Pt will improve gait velocity to at least 2.9 ft/s wit LRAD for improved gait efficiency  Baseline: 2.45 ft/s w/LRAD  Goal status: INITIAL  6. Pt will improve 5 x STS to less than or equal to 15 seconds without UE support to demonstrate improved functional strength and transfer efficiency.  Baseline: 20.69s without UE support  on 10/11  Goal Status: INITIAL    ASSESSMENT:  CLINICAL IMPRESSION: Pt was able to demonstrate balance without AD, on solid surface with multi level reaching activity.  Pt continues to need intermittent UE support for SLS (tapping block) activities on solid surface.  Pt demonstrates dynamic gait in household setting with SPC at supervision with no LOB and good foot clearance. Continue with POC.   OBJECTIVE IMPAIRMENTS Abnormal gait, decreased balance, decreased coordination, decreased mobility, difficulty walking, and decreased safety awareness.   ACTIVITY LIMITATIONS lifting, standing, stairs, transfers, reach over head, and locomotion level  PARTICIPATION LIMITATIONS: cleaning, laundry, driving, shopping, community activity, and yard work  [place] Age, Fitness, Time since onset of injury/illness/exacerbation, and 1-2 comorbidities: cerebellar ataxia and history of TIA  are also affecting patient's functional outcome.   REHAB POTENTIAL: Good  CLINICAL DECISION MAKING: Evolving/moderate complexity  EVALUATION COMPLEXITY: Moderate  PLAN: PT FREQUENCY: 2x/week  PT DURATION: 6 weeks  PLANNED INTERVENTIONS: Therapeutic exercises, Therapeutic activity, Neuromuscular re-education, Balance training, Gait training, Patient/Family education, Self Care, Stair training, Vestibular training, Canalith repositioning, DME instructions, Aquatic Therapy, Manual therapy, and Re-evaluation  PLAN FOR NEXT SESSION: , Retro gait, Eccentric heel taps, Plyometrics, safety w/gait and transfers (he uses SPC at home), continue to practice w/ weighted vest (I used 15#), LE coordination, floor transfer, gait with theraband, tall-kneeling? Rockerboard and unlevel surfaces, turns, update HEP   Bjorn Loser, PTA  Charlett Nose, PT, [facility] [address] [place], [place]  [address] Phone:  [phone] Fax:  [phone] 03/18/22, 12:56 PM

## 2022-03-19 DIAGNOSIS — N3941 Urge incontinence: Secondary | ICD-10-CM | POA: Diagnosis not present

## 2022-03-19 DIAGNOSIS — R3915 Urgency of urination: Secondary | ICD-10-CM | POA: Diagnosis not present

## 2022-03-20 ENCOUNTER — Ambulatory Visit: Payer: Medicare Other | Attending: Neurology | Admitting: Physical Therapy

## 2022-03-20 DIAGNOSIS — M6281 Muscle weakness (generalized): Secondary | ICD-10-CM | POA: Insufficient documentation

## 2022-03-20 DIAGNOSIS — R2681 Unsteadiness on feet: Secondary | ICD-10-CM | POA: Insufficient documentation

## 2022-03-20 DIAGNOSIS — Z9181 History of falling: Secondary | ICD-10-CM | POA: Insufficient documentation

## 2022-03-20 DIAGNOSIS — R278 Other lack of coordination: Secondary | ICD-10-CM | POA: Insufficient documentation

## 2022-03-20 DIAGNOSIS — R270 Ataxia, unspecified: Secondary | ICD-10-CM | POA: Diagnosis not present

## 2022-03-20 DIAGNOSIS — R260 Ataxic gait: Secondary | ICD-10-CM | POA: Insufficient documentation

## 2022-03-20 NOTE — Therapy (Signed)
OUTPATIENT PHYSICAL THERAPY NEURO TREATMENT   Patient Name: Derrick White MRN: [id number] DOB:06/10/1940, 81 y.o., male Today's Date: 03/20/2022   PCP: Haywood Pao, MD REFERRING PROVIDER: Britt Bottom, MD       PT End of Session - 03/20/22 1023     Visit Number 11    Number of Visits 13    Date for PT Re-Evaluation 03/29/22    Authorization Type Medicare    Progress Note Due on Visit 10    PT Start Time 1021   Previous session ran late   PT Stop Time 1101    PT Time Calculation (min) 40 min    Equipment Utilized During Treatment Gait belt    Activity Tolerance Patient tolerated treatment well    Behavior During Therapy [facility] for tasks assessed/performed;Impulsive                     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   Cerebellar ataxia ([place])    Dysesthesia 06/04/2016   Essential hypertension 11/05/2019   Foot pain, left 06/04/2016   Gait disturbance 03/16/2019   History of cervical spinal surgery 06/04/2016   History of hiatal hernia    History of kidney stones    Hypertension    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   Memory loss 03/16/2019   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   Pain due to onychomycosis of toenails of both feet 04/28/2019   Pain in left ankle and joints of left foot 08/06/2018   Slurred speech 03/16/2019   Superficial peroneal nerve neuropathy, 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   TIA (transient ischemic attack) 11/05/2019   Worsened handwriting 03/16/2019   Past Surgical History:  Procedure Laterality Date   [place] N/A 09/13/2020   Procedure: CARDIOVERSION;  Surgeon: Sanda Klein, MD;  Location: [place];  Service: Cardiovascular;  Laterality: N/A;   FRACTURE SURGERY     Left ankle, sx x4   INGUINAL HERNIA REPAIR Right 03/05/2019   Procedure: OPEN RIGHT INGUINAL HERNIA REPAIR WITH MESH;  Surgeon: Clovis Riley, MD;  Location: [place];  Service: General;  Laterality: Right;   [place] N/A 07/27/2020   Procedure: LEFT ATRIAL APPENDAGE OCCLUSION;  Surgeon: Sherren Mocha, MD;  Location: [place] [facility];  Service: Cardiovascular;  Laterality: N/A;   SHOULDER SURGERY Left    TEE WITHOUT CARDIOVERSION N/A 07/27/2020   Procedure: TRANSESOPHAGEAL ECHOCARDIOGRAM (TEE);  Surgeon: Sherren Mocha, MD;  Location: [place] [facility];  Service: Cardiovascular;  Laterality: N/A;   TEE WITHOUT CARDIOVERSION N/A 09/13/2020   Procedure: TRANSESOPHAGEAL ECHOCARDIOGRAM (TEE);  Surgeon: Sanda Klein, MD;  Location[facility];  Service: Cardiovascular;  Laterality: N/A;   TONSILLECTOMY     1945   Patient Active Problem List   Diagnosis Date Noted   Pain in right shoulder 07/31/2021   Persistent atrial fibrillation ([place]) 08/28/2020   Secondary hypercoagulable state ([place]) 07/20/2020   Paroxysmal atrial fibrillation (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   C6 radiculopathy 05/04/2019   Pain due to onychomycosis of toenails of both feet 04/28/2019   Worsened handwriting 03/16/2019   Gait disturbance 03/16/2019   Slurred speech 03/16/2019   Memory loss 03/16/2019  Pain in left ankle and joints of left foot 08/06/2018   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   Foot pain, left 06/04/2016   Dysesthesia 06/04/2016   Superficial peroneal nerve neuropathy, 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  No falls since last visit.  Pt accompanied by: self   PERTINENT HISTORY:  C6 radiculopathy, Cerebellar ataxia ([place]), Dysesthesia, Essential hypertension, Foot pain (left), Gait disturbance, History of cervical spinal surgery (06/04/2016), History of hiatal hernia, History of kidney stones, 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, Memory loss, Mild cognitive impairment, Pain due to onychomycosis of toenails of both feet, Pain in left ankle and joints of left foot, Slurred speech, Superficial peroneal nerve neuropathy, left (06/04/2009),TIA (transient ischemic attack) 11/05/2019. NCV/EMG study did not show any evidence of motor neuron disease. We did the Athena SCA panel (30+ genes). It was borderline. 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 Unclear if this is playing a role (SCA type 6 has mutations in this [place] but usually triplet repeat)   PAIN:  Are you having pain? No  PRECAUTIONS: Fall  FALLS: Has patient fallen in last 6 months? Yes. Number of falls around 3, mostly inside his home all in retro direction  PLOF: Independent with basic ADLs and Requires assistive device for independence  PATIENT GOALS "balance"  OBJECTIVE:   DIAGNOSTIC FINDINGS: NCV/EMG study 05/04/2019 showed the following: 1.   Chronic right C6 radiculopathy.  There could also be milder C5 and C7 chronic radiculopathies. 2.   Chronic left S1 radiculopathy. 3.   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   There was no evidence of polyneuropathy, motor neuron disease or myopathy.  COORDINATION: Heel to shin test: Dysmetria bilaterally    POSTURE: rounded shoulders, forward head, increased thoracic kyphosis, and posterior  pelvic tilt   TODAY'S TREATMENT:   Ther Act   LTG assessment     [facility] PT Assessment - 03/20/22 1029       Transfers  Five time sit to stand comments  17.15s without UE support      Ambulation/Gait   Gait velocity 32.8' over 15.69s = 2.09 ft/s w/rollator      Timed Up and Go Test   Normal TUG (seconds) 18.37   w/rollator          Practiced proper turn technique w/rollator as pt tends to push walker away from him and reach to chair and turn to avoid retro stepping. Pt practiced TUG x5 using proper turn technique and averaged 22s per round, but noted increased stability and improved retro stepping w/technique. Encouraged pt to practice at home for improved safety w/transfers and continued retro stepping practice   NMR  In // bars for improved reactive/anticipatory balance strategies, postural control and reaching out of BOS: Standing on airex, ball tosses w/second therapist. Noted wide BOS on foam but no instability, CGA throughout. Progressed to standing on rockerboard in A/P direction for increased challenge. Noted pt attempting to stand wide on board (feet hanging off board), so min cues for more narrow BOS and noted increased difficulty w/postural control w/more narrow BOS. Pt w/increased difficulty reaching to R side > L. Min A throughout              Ther Ex  SciFit multi-peaks level 8 for 8 minutes using BLEs only for neural priming for reciprocal movement, dynamic cardiovascular conditioning and BLE strength.    PATIENT EDUCATION: Education details: Goal assessment, continue HEP, think about DC plan  Person educated: Patient Education method: Explanation and Demonstration Education comprehension: verbalized understanding   HOME EXERCISE PROGRAM: Access Code: [email or web address] URL: [email or web address] Date: 02/11/2022 Prepared by: Mickie Bail Troye Hiemstra  Exercises (using red theraband)  - Sit to Stand Without Arm Support  - 1 x daily - 7 x weekly - 3 sets - 10  reps - Step sideways with arms reaching at counter   - 1 x daily - 7 x weekly - 3 sets - 10 reps - Standing Balance Activity: Plyometric Modified Lower Extremity Jumping Jack  - 1 x daily - 7 x weekly - 3 sets - 10 reps - [employer] on Counter  - 1 x daily - 7 x weekly - 3 sets - 10 reps - Side Stepping with Resistance at [employer] and Counter Support  - 1 x daily - 7 x weekly - 3 sets - 10 reps - Forward Backward Monster Walk with Band at [employer] and Counter Support  - 1 x daily - 7 x weekly - 3 sets - 10 reps    GOALS: Goals reviewed with patient? Yes  SHORT TERM GOALS: Target date: 03/01/2022  Pt will be independent with initial HEP for improved strength, balance, transfers and gait.  Baseline: Not established on eval  Goal status: MET  2.  Pt will improve 5 x STS to less than or equal to 15 seconds without bracing against chair or >2 instances of retropulsion to demonstrate improved functional strength and transfer efficiency.   Baseline: 14.6s without UE support, significant retropulsion and bracing against chair w/BLEs; 20.69s without UE support  Goal status: IN PROGRESS  3.  Pt will improve Berg score to 41/56 for decreased fall risk  Baseline: 37/56; 48/56  Goal status: MET  4.  Pt will perform floor transfer and LTG updated as appropriate  Baseline:  Goal status: MET   LONG TERM GOALS: Target date: 03/29/2022 (updated to match cert date)  Pt will be independent with final HEP for improved strength, balance,  transfers and gait.  Baseline:  Goal status: INITIAL  2.  Pt will improve Berg score to 45/56 for decreased fall risk  Baseline: 37/56; 48/56  Goal status: MET  3.  Pt will improve normal TUG to less than or equal to 14 seconds w/LRAD for improved functional mobility and decreased fall risk.  Baseline: 18.31s w/rollator; 18.37s w/rollator  Goal status: IN PROGRESS  4.  Floor transfer goal  Baseline:  Goal status: INITIAL  5.  Pt will improve gait  velocity to at least 2.9 ft/s wit LRAD for improved gait efficiency  Baseline: 2.45 ft/s w/LRAD; 2.09 ft/s on 11/1  Goal status: IN PROGRESS  6. Pt will improve 5 x STS to less than or equal to 15 seconds without UE support to demonstrate improved functional strength and transfer efficiency.  Baseline: 20.69s without UE support on 10/11; 17.15s without UE support on 11/1  Goal Status: IN PROGRESS    ASSESSMENT:  CLINICAL IMPRESSION: Emphasis of skilled PT session on starting LTG assessment and reactive balance strategies. Pt continues to progress towards his LTGs, improving his stability and speed w/5x STS but narrowly missing his goal level. Pt continues to be limited by impulsiveness and poor AD management, specifically with turns. LTG date updated to reflect cert date as pt continues to progress towards goals. Continue POC.    OBJECTIVE IMPAIRMENTS Abnormal gait, decreased balance, decreased coordination, decreased mobility, difficulty walking, and decreased safety awareness.   ACTIVITY LIMITATIONS lifting, standing, stairs, transfers, reach over head, and locomotion level  PARTICIPATION LIMITATIONS: cleaning, laundry, driving, shopping, community activity, and yard work  [place] Age, Fitness, Time since onset of injury/illness/exacerbation, and 1-2 comorbidities: cerebellar ataxia and history of TIA  are also affecting patient's functional outcome.   REHAB POTENTIAL: Good  CLINICAL DECISION MAKING: Evolving/moderate complexity  EVALUATION COMPLEXITY: Moderate  PLAN: PT FREQUENCY: 2x/week  PT DURATION: 6 weeks  PLANNED INTERVENTIONS: Therapeutic exercises, Therapeutic activity, Neuromuscular re-education, Balance training, Gait training, Patient/Family education, Self Care, Stair training, Vestibular training, Canalith repositioning, DME instructions, Aquatic Therapy, Manual therapy, and Re-evaluation  PLAN FOR NEXT SESSION: , Retro gait, Eccentric heel taps,  Plyometrics, safety w/gait and transfers (he uses SPC at home), continue to practice w/ weighted vest (I used 15#), LE coordination, floor transfer, gait with theraband, tall-kneeling? Rockerboard and unlevel surfaces, turns, update HEP. Turns, recert?    Cruzita Lederer Natalia Wittmeyer, PT, DPT 03/20/22, 11:03 AM

## 2022-03-25 ENCOUNTER — Ambulatory Visit: Payer: Medicare Other

## 2022-03-25 DIAGNOSIS — R260 Ataxic gait: Secondary | ICD-10-CM

## 2022-03-25 DIAGNOSIS — R278 Other lack of coordination: Secondary | ICD-10-CM | POA: Diagnosis not present

## 2022-03-25 DIAGNOSIS — M6281 Muscle weakness (generalized): Secondary | ICD-10-CM | POA: Diagnosis not present

## 2022-03-25 DIAGNOSIS — R2681 Unsteadiness on feet: Secondary | ICD-10-CM

## 2022-03-25 DIAGNOSIS — R270 Ataxia, unspecified: Secondary | ICD-10-CM | POA: Diagnosis not present

## 2022-03-25 DIAGNOSIS — Z9181 History of falling: Secondary | ICD-10-CM | POA: Diagnosis not present

## 2022-03-25 NOTE — Therapy (Signed)
OUTPATIENT PHYSICAL THERAPY NEURO TREATMENT   Patient Name: Derrick White MRN: [id number] DOB:09-22-1940, 81 y.o., male Today's Date: 03/25/2022   PCP: Haywood Pao, MD REFERRING PROVIDER: Britt Bottom, MD       PT End of Session - 03/25/22 1045     Visit Number 12    Number of Visits 21   re-cert   Date for PT Re-Evaluation 44/96/75   re-cert   Authorization Type Medicare    Progress Note Due on Visit 20    PT Start Time 1102    PT Stop Time 1142    PT Time Calculation (min) 40 min    Equipment Utilized During Treatment Gait belt    Activity Tolerance Patient tolerated treatment well    Behavior During Therapy WFL for tasks assessed/performed;Impulsive                     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   Cerebellar ataxia ([place])    Dysesthesia 06/04/2016   Essential hypertension 11/05/2019   Foot pain, left 06/04/2016   Gait disturbance 03/16/2019   History of cervical spinal surgery 06/04/2016   History of hiatal hernia    History of kidney stones    Hypertension    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   Memory loss 03/16/2019   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   Pain due to onychomycosis of toenails of both feet 04/28/2019   Pain in left ankle and joints of left foot 08/06/2018   Slurred speech 03/16/2019   Superficial peroneal nerve neuropathy, 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   TIA (transient ischemic attack) 11/05/2019   Worsened handwriting 03/16/2019   Past Surgical History:  Procedure Laterality Date   [place] N/A 09/13/2020   Procedure: CARDIOVERSION;  Surgeon: Sanda Klein, MD;  Location: [place];  Service: Cardiovascular;  Laterality: N/A;   FRACTURE SURGERY     Left ankle, sx x4   INGUINAL HERNIA REPAIR Right 03/05/2019   Procedure: OPEN RIGHT INGUINAL HERNIA REPAIR WITH MESH;  Surgeon: Clovis Riley, MD;  Location: [place];   Service: General;  Laterality: Right;   [place] N/A 07/27/2020   Procedure: LEFT ATRIAL APPENDAGE OCCLUSION;  Surgeon: Sherren Mocha, MD;  Location: [place] [facility] [facility];  Service: Cardiovascular;  Laterality: N/A;   SHOULDER SURGERY Left    [place] WITHOUT CARDIOVERSION N/A 07/27/2020   Procedure: TRANSESOPHAGEAL ECHOCARDIOGRAM (TEE);  Surgeon: Sherren Mocha, MD;  Location: [place] [facility];  Service: Cardiovascular;  Laterality: N/A;   TEE WITHOUT CARDIOVERSION N/A 09/13/2020   Procedure: TRANSESOPHAGEAL ECHOCARDIOGRAM (TEE);  Surgeon: Sanda Klein, MD;  [facility];  Service: Cardiovascular;  Laterality: N/A;   TONSILLECTOMY     1945   Patient Active Problem List   Diagnosis Date Noted   Pain in right shoulder 07/31/2021   Persistent atrial fibrillation ([place]) 08/28/2020   Secondary hypercoagulable state ([place]) 07/20/2020   Paroxysmal atrial fibrillation ([place]) 06/19/2020   TIA (transient ischemic attack) 11/05/2019   Essential hypertension 11/05/2019   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/05/2019   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   C6 radiculopathy 05/04/2019   Pain due to onychomycosis of toenails of both feet 04/28/2019   Worsened handwriting 03/16/2019   Gait disturbance 03/16/2019   Slurred speech 03/16/2019   Memory loss 03/16/2019   Pain  in left ankle and joints of left foot 08/06/2018   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   Foot pain, left 06/04/2016   Dysesthesia 06/04/2016   Superficial peroneal nerve neuropathy, 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   History of cervical spinal surgery 06/04/2016    ONSET DATE: 02/06/2022   REFERRING DIAG: R26.9 (ICD-10-CM) - Gait disturbance G11.9 (ICD-10-CM) - Cerebellar ataxia (H[facility])   THERAPY DIAG:  Unsteadiness on feet  Ataxic gait  History of falling  Other lack of coordination  Muscle weakness (generalized)  Ataxia  Rationale for Evaluation and Treatment  Rehabilitation  SUBJECTIVE:                                                                                                                                                                                              SUBJECTIVE STATEMENT: Patient reports doing well. Denies falls, but reports multiple near falls. These occur when he's using the cone or without any AD. Patient requesting to extend PT.   Pt accompanied by: self   PERTINENT HISTORY:  C6 radiculopathy, Cerebellar ataxia ([place]), Dysesthesia, Essential hypertension, Foot pain (left), Gait disturbance, History of cervical spinal surgery (06/04/2016), History of hiatal hernia, History of kidney stones, 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, Memory loss, Mild cognitive impairment, Pain due to onychomycosis of toenails of both feet, Pain in left ankle and joints of left foot, Slurred speech, Superficial peroneal nerve neuropathy, left (06/04/2009),TIA (transient ischemic attack) 11/05/2019. NCV/EMG study did not show any evidence of motor neuron disease. We did the [employer] SCA panel (30+ genes). It was borderline. 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 No  PRECAUTIONS: Fall  FALLS: Has patient fallen in last 6 months? Yes. Number of falls around 3, mostly inside his home all in retro direction  PLOF: Independent with basic ADLs and Requires assistive device for independence  PATIENT GOALS "balance"  OBJECTIVE:   DIAGNOSTIC FINDINGS: NCV/EMG study 05/04/2019 showed the following: 1.   Chronic right C6 radiculopathy.  There could also be milder C5 and C7 chronic radiculopathies. 2.   Chronic left S1 radiculopathy. 3.   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   There was no evidence of  polyneuropathy, motor neuron disease or myopathy.  COORDINATION: Heel to shin test: Dysmetria bilaterally    POSTURE: rounded shoulders, forward head, increased thoracic kyphosis, and posterior pelvic tilt   TODAY'S TREATMENT:  Ther Act   LTG assessment     [facility] PT Assessment - 03/25/22 0001       Standardized Balance Assessment   Standardized Balance Assessment Five Times Sit to Stand    Five times sit to stand comments  14.31s no UE    10 Meter Walk 2.30f/s or .69m      Timed Up and Go Test   Normal TUG (seconds) 21.06   with Rollator           -blocked practice 1066fait with Rollator and safe sitting/standing  -figure 8 gait with rollator + cues to remain inside the walker frame    NMR:   -NuStep x10 mins BUE/LE level 4 for increased large amplitude reciprocal coordination   PATIENT EDUCATION: Education details: Goal assessment, continue HEP Person educated: Patient Education method: ExpCustomer service managerucation comprehension: verbalized understanding   HOME EXERCISE PROGRAM: Access Code: [email or web address]: [email or web address] Date: 02/11/2022 Prepared by: JanMickie Bailaster  Exercises (using red theraband)  - Sit to Stand Without Arm Support  - 1 x daily - 7 x weekly - 3 sets - 10 reps - Step sideways with arms reaching at counter   - 1 x daily - 7 x weekly - 3 sets - 10 reps - Standing Balance Activity: Plyometric Modified Lower Extremity Jumping Jack  - 1 x daily - 7 x weekly - 3 sets - 10 reps - [employer] Counter  - 1 x daily - 7 x weekly - 3 sets - 10 reps - Side Stepping with Resistance at [employer] Counter Support  - 1 x daily - 7 x weekly - 3 sets - 10 reps - Forward Backward Monster Walk with Band at [employer] Counter Support  - 1 x daily - 7 x weekly - 3 sets - 10 reps    GOALS: Goals reviewed with patient? Yes  SHORT TERM GOALS: Target date: 03/01/2022  Pt will be independent with initial HEP for improved  strength, balance, transfers and gait.  Baseline: Not established on eval  Goal status: MET  2.  Pt will improve 5 x STS to less than or equal to 15 seconds without bracing against chair or >2 instances of retropulsion to demonstrate improved functional strength and transfer efficiency.   Baseline: 14.6s without UE support, significant retropulsion and bracing against chair w/BLEs; 20.69s without UE support  Goal status: IN PROGRESS  3.  Pt will improve Berg score to 41/56 for decreased fall risk  Baseline: 37/56; 48/56  Goal status: MET  4.  Pt will perform floor transfer and LTG updated as appropriate  Baseline:  Goal status: MET   LONG TERM GOALS: Target date: 03/29/2022 (updated to match cert date)  Pt will be independent with final HEP for improved strength, balance, transfers and gait.  Baseline:  Goal status: INITIAL  2.  Pt will improve Berg score to 45/56 for decreased fall risk  Baseline: 37/56; 48/56  Goal status: MET  3.  Pt will improve normal TUG to less than or equal to 14 seconds w/LRAD for improved functional mobility and decreased fall risk.  Baseline: 18.31s w/rollator; 18.37s w/rollator; 21.06s with rollator Goal status: IN PROGRESS  4.  Floor transfer goal  Baseline: assessed with previous PT (ModI/supervision)  Goal status: goal not needed  5.  Pt will improve gait velocity to at least 2.9 ft/s wit LRAD for improved gait efficiency  Baseline: 2.45 ft/s w/LRAD; 2.09 ft/s on 11/1; 2.15f56f Goal status:  IN PROGRESS  6. Pt will improve 5 x STS to less than or equal to 15 seconds without UE support to demonstrate improved functional strength and transfer efficiency.  Baseline: 20.69s without UE support on 10/11; 17.15s without UE support on 11/1; 14.31s no UE  Goal Status: MET  NEW LONG TERM GOALS: Target date: 04/22/22 1. Pt will be independent with final HEP for improved strength, balance, transfers and gait.  Baseline:  Goal status:  INITIAL  2.  Pt will improve Berg score to 52/56 for decreased fall risk  Baseline: 37/56; 48/56  Goal status: INITIAL  3.  Pt will improve normal TUG to less than or equal to 14 seconds w/LRAD for improved functional mobility and decreased fall risk.  Baseline: 18.31s w/rollator; 18.37s w/rollator  Goal status: IN PROGRESS   4.  Pt will improve gait velocity to at least 2.9 ft/s wit LRAD for improved gait efficiency  Baseline: 2.45 ft/s w/LRAD; 2.09 ft/s on 11/1  Goal status: IN PROGRESS  5. Pt will improve 5 x STS to less than or equal to 15 seconds without UE support to demonstrate improved functional strength and transfer efficiency.  Baseline: 20.69s without UE support on 10/11; 17.15s without UE support on 11/1  Goal Status: IN PROGRESS    ASSESSMENT:  CLINICAL IMPRESSION: Patient seen for skilled PT session with emphasis on goal assessment and blocked practice with rollator. Five times Sit to Stand Test (FTSS) Method: Use a straight back chair with a solid seat that is 17-18" high. Ask participant to sit on the chair with arms folded across their chest.   Instructions: "Stand up and sit down as quickly as possible 5 times, keeping your arms folded across your chest."   Measurement: Stop timing when the participant touches the chair in sitting the 5th time.  TIME: 14.31 sec no UE  Cut off scores indicative of increased fall risk: >12 sec CVA, >16 sec PD, >13 sec vestibular (ANPTA Core Set of Outcome Measures for Adults with Neurologic Conditions, 2018). 10 Meter Walk Test: Patient instructed to walk 10 meters (32.8 ft) as quickly and as safely as possible at their normal speed x2 and at a fast speed x2. Time measured from 2 meter mark to 8 meter mark to accommodate ramp-up and ramp-down.  Normal speed: .25ms Cut off scores: <0.4 m/s = household Ambulator, 0.4-0.8 m/s = limited community Ambulator, >0.8 m/s = community Ambulator, >1.2 m/s = crossing a street, <1.0 =  increased fall risk MCID 0.05 m/s (small), 0.13 m/s (moderate), 0.06 m/s (significant)  (ANPTA Core Set of Outcome Measures for Adults with Neurologic Conditions, 2018). Patient completed the Timed Up and Go test (TUG) in 21.06 seconds.  Geriatrics: need for further assessment of fall risk: ? 12 sec; Recurrent falls: > 15 sec; Vestibular Disorders fall risk: > 15 sec; Parkinson's Disease fall risk: > 16 sec ([email or web address]). Discussed with patient using the rollator within the house to minimize frequency of near falls. Patient verbalized understanding. Continue POC.   OBJECTIVE IMPAIRMENTS Abnormal gait, decreased balance, decreased coordination, decreased mobility, difficulty walking, and decreased safety awareness.   ACTIVITY LIMITATIONS lifting, standing, stairs, transfers, reach over head, and locomotion level  PARTICIPATION LIMITATIONS: cleaning, laundry, driving, shopping, community activity, and yard work  [place], Time since onset of injury/illness/exacerbation, and 1-2 comorbidities: cerebellar ataxia and history of TIA  are also affecting patient's functional outcome.   REHAB POTENTIAL: Good  CLINICAL DECISION MAKING: Evolving/moderate complexity  EVALUATION COMPLEXITY:  Moderate  PLAN: PT FREQUENCY: 2x/week  PT DURATION: 6 weeks  PLANNED INTERVENTIONS: Therapeutic exercises, Therapeutic activity, Neuromuscular re-education, Balance training, Gait training, Patient/Family education, Self Care, Stair training, Vestibular training, Canalith repositioning, DME instructions, Aquatic Therapy, Manual therapy, and Re-evaluation  PLAN FOR NEXT SESSION: , Retro gait, Eccentric heel taps, Plyometrics, safety w/gait and transfers (he uses SPC at home), continue to practice w/ weighted vest (I used 15#), LE coordination, floor transfer, gait with theraband, tall-kneeling? Rockerboard and unlevel surfaces, turns, update HEP. Turns   Debbora Dus, PT, DPT,  CBIS 03/25/22, 11:55 AM

## 2022-03-29 ENCOUNTER — Ambulatory Visit: Payer: Medicare Other

## 2022-03-29 DIAGNOSIS — M6281 Muscle weakness (generalized): Secondary | ICD-10-CM

## 2022-03-29 DIAGNOSIS — R260 Ataxic gait: Secondary | ICD-10-CM | POA: Diagnosis not present

## 2022-03-29 DIAGNOSIS — R2681 Unsteadiness on feet: Secondary | ICD-10-CM

## 2022-03-29 DIAGNOSIS — R270 Ataxia, unspecified: Secondary | ICD-10-CM | POA: Diagnosis not present

## 2022-03-29 DIAGNOSIS — Z9181 History of falling: Secondary | ICD-10-CM

## 2022-03-29 DIAGNOSIS — R278 Other lack of coordination: Secondary | ICD-10-CM

## 2022-03-29 NOTE — Therapy (Signed)
OUTPATIENT PHYSICAL THERAPY NEURO TREATMENT   Patient Name: Derrick White MRN: [id number] DOB:Oct 20, 1940, 81 y.o., male Today's Date: 03/29/2022   PCP: Haywood Pao, MD REFERRING PROVIDER: Britt Bottom, MD    PT End of Session - 03/29/22 1052     Visit Number 13    Number of Visits 21    Date for PT Re-Evaluation 04/22/22    Authorization Type Medicare    Progress Note Due on Visit 20    PT Start Time 1101    PT Stop Time 1145    PT Time Calculation (min) 44 min    Equipment Utilized During Treatment Gait belt    Activity Tolerance Patient tolerated treatment well    Behavior During Therapy WFL for tasks assessed/performed;Impulsive             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   Cerebellar ataxia ([place])    Dysesthesia 06/04/2016   Essential hypertension 11/05/2019   Foot pain, left 06/04/2016   Gait disturbance 03/16/2019   History of cervical spinal surgery 06/04/2016   History of hiatal hernia    History of kidney stones    Hypertension    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   Memory loss 03/16/2019   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   Pain due to onychomycosis of toenails of both feet 04/28/2019   Pain in left ankle and joints of left foot 08/06/2018   Slurred speech 03/16/2019   Superficial peroneal nerve neuropathy, 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   TIA (transient ischemic attack) 11/05/2019   Worsened handwriting 03/16/2019   Past Surgical History:  Procedure Laterality Date   [place] N/A 09/13/2020   Procedure: CARDIOVERSION;  Surgeon: Sanda Klein, MD;  Location: [place];  Service: Cardiovascular;  Laterality: N/A;   FRACTURE SURGERY     Left ankle, sx x4   INGUINAL HERNIA REPAIR Right 03/05/2019   Procedure: OPEN RIGHT INGUINAL HERNIA REPAIR WITH MESH;  Surgeon: Clovis Riley, MD;  Location: [place];  Service: General;  Laterality: Right;    [place] N/A 07/27/2020   Procedure: LEFT ATRIAL APPENDAGE OCCLUSION;  Surgeon: Sherren Mocha, MD;  Location: [place] [facility];  Service: Cardiovascular;  Laterality: N/A;   SHOULDER SURGERY Left    TEE WITHOUT CARDIOVERSION N/A 07/27/2020   Procedure: TRANSESOPHAGEAL ECHOCARDIOGRAM (TEE);  Surgeon: Sherren Mocha, MD;  Location: [place] [facility];  Service: Cardiovascular;  Laterality: N/A;   TE[place] WITHOUT CARDIOVERSION N/A 09/13/2020   Procedure: TRANSESOPHAGEAL ECHOCARDIOGRAM (TEE);  Surgeon: Sanda Klein, MD;  Location: [facility] END[facility];  Service: Cardiovascular;  Laterality: N/A;   TONSILLECTOMY     1945   Patient Active Problem List   Diagnosis Date Noted   Pain in right shoulder 07/31/2021   Persistent atrial fibrillation ([place]) 08/28/2020   Secondary hypercoagulable state ([place]) 07/20/2020   Paroxysmal atrial fibrillation ([place]) 06/19/2020   TIA (transient ischemic attack) 11/05/2019   Essential hypertension 11/05/2019   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/05/2019   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   C6 radiculopathy 05/04/2019   Pain due to onychomycosis of toenails of both feet 04/28/2019   Worsened handwriting 03/16/2019   Gait disturbance 03/16/2019   Slurred speech 03/16/2019   Memory loss 03/16/2019   Pain in left ankle and joints of left foot 08/06/2018   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  Foot pain, left 06/04/2016   Dysesthesia 06/04/2016   Superficial peroneal nerve neuropathy, 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   History of cervical spinal surgery 06/04/2016    ONSET DATE: 02/06/2022   REFERRING DIAG: R26.9 (ICD-10-CM) - Gait disturbance G11.9 (ICD-10-CM) - Cerebellar ataxia (HCC)   THERAPY DIAG:  Unsteadiness on feet  Ataxic gait  Other lack of coordination  Muscle weakness (generalized)  History of falling  Ataxia  Rationale for Evaluation and Treatment Rehabilitation  SUBJECTIVE:                                                                                                                                                                                               SUBJECTIVE STATEMENT: Patient reports doing well. No falls/near falls. Reports difficulty doing jumping jacks.  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 NCV/EMG study did not show any evidence of motor neuron disease. We did the Athena SCA panel (30+ genes). It was borderline. 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 Unclear if this is playing a role (SCA type 6 has mutations in this [place] but usually triplet repeat)   PAIN:  Are you having pain? No  PRECAUTIONS: Fall  FALLS: Has patient fallen in last 6 months? Yes. Number of falls around 3, mostly inside his home all in retro direction  PLOF: Independent with basic ADLs and Requires assistive device for independence  PATIENT GOALS "balance"  OBJECTIVE:   DIAGNOSTIC FINDINGS: NCV/EMG study 05/04/2019 showed the following: 1.   Chronic right C6 radiculopathy.  There could also be milder C5 and C7 chronic radiculopathies. 2.   Chronic left S1 radiculopathy. 3.   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   There was no evidence of polyneuropathy, motor neuron disease or myopathy.  COORDINATION: Heel to shin test: Dysmetria bilaterally    POSTURE: rounded shoulders, forward  head, increased thoracic kyphosis, and posterior pelvic tilt   TODAY'S TREATMENT:   Ther Act   -Standing on foam golf putting to target (grossly CGA/MinA to maintain balance   -Standing Surge curls   -sit <> stand holding surge x12       TherEx  -counter top jumping jacks B LE + CGA x12   -progressed HEP with green theraband  -scifit hills x5 mins level 3   PATIENT EDUCATION: Education details: continue HEP Person educated: Patient Education method: Customer service manager Education comprehension: verbalized understanding   HOME EXERCISE PROGRAM: Access Code: [email or web address] URL: [email or web address] Date: 02/11/2022 Prepared by: Mickie Bail Plaster  Exercises (using red theraband)  - Sit to Stand Without Arm Support  - 1 x daily - 7 x weekly - 3 sets - 10 reps - Step sideways with arms reaching at counter   - 1 x daily - 7 x weekly - 3 sets - 10 reps - Standing Balance Activity: Plyometric Modified Lower Extremity Jumping Jack  - 1 x daily - 7 x weekly - 3 sets - 10 reps - [employer] on Counter  - 1 x daily - 7 x weekly - 3 sets - 10 reps - Side Stepping with Resistance at [employer] and Counter Support  - 1 x daily - 7 x weekly - 3 sets - 10 reps - Forward Backward Monster Walk with Band at [employer] and Counter Support  - 1 x daily - 7 x weekly - 3 sets - 10 reps    GOALS: Goals reviewed with patient? Yes  SHORT TERM GOALS: Target date: 03/01/2022  Pt will be independent with initial HEP for improved strength, balance, transfers and gait.  Baseline: Not established on eval  Goal status: MET  2.  Pt will improve 5 x STS to less than or equal to 15 seconds without bracing against chair or >2 instances of retropulsion to demonstrate improved functional strength and transfer efficiency.   Baseline: 14.6s without UE support, significant retropulsion and bracing against chair w/BLEs; 20.69s without UE support  Goal status: IN PROGRESS  3.  Pt will improve Berg  score to 41/56 for decreased fall risk  Baseline: 37/56; 48/56  Goal status: MET  4.  Pt will perform floor transfer and LTG updated as appropriate  Baseline:  Goal status: MET   LONG TERM GOALS: Target date: 03/29/2022 (updated to match cert date)  Pt will be independent with final HEP for improved strength, balance, transfers and gait.  Baseline:  Goal status: INITIAL  2.  Pt will improve Berg score to 45/56 for decreased fall risk  Baseline: 37/56; 48/56  Goal status: MET  3.  Pt will improve normal TUG to less than or equal to 14 seconds w/LRAD for improved functional mobility and decreased fall risk.  Baseline: 18.31s w/rollator; 18.37s w/rollator; 21.06s with rollator Goal status: IN PROGRESS  4.  Floor transfer goal  Baseline: assessed with previous PT (ModI/supervision)  Goal status: goal not needed  5.  Pt will improve gait velocity to at least 2.9 ft/s wit LRAD for improved gait efficiency  Baseline: 2.45 ft/s w/LRAD; 2.09 ft/s on 11/1; 2.31f/s  Goal status: IN PROGRESS  6. Pt will improve 5 x STS to less than or equal to 15 seconds without UE support to demonstrate improved functional strength and transfer efficiency.  Baseline: 20.69s without UE support on 10/11; 17.15s without UE support on 11/1; 14.31s no UE  Goal Status: MET  NEW LONG TERM GOALS: Target date: 04/22/22 1. Pt will be independent with final HEP for improved strength, balance, transfers and gait.  Baseline:  Goal status: INITIAL  2.  Pt will improve Berg score to 52/56 for decreased fall risk  Baseline: 37/56; 48/56  Goal status: INITIAL  3.  Pt  will improve normal TUG to less than or equal to 14 seconds w/LRAD for improved functional mobility and decreased fall risk.  Baseline: 18.31s w/rollator; 18.37s w/rollator  Goal status: IN PROGRESS   4.  Pt will improve gait velocity to at least 2.9 ft/s wit LRAD for improved gait efficiency  Baseline: 2.45 ft/s w/LRAD; 2.09 ft/s on 11/1   Goal status: IN PROGRESS  5. Pt will improve 5 x STS to less than or equal to 15 seconds without UE support to demonstrate improved functional strength and transfer efficiency.  Baseline: 20.69s without UE support on 10/11; 17.15s without UE support on 11/1  Goal Status: IN PROGRESS    ASSESSMENT:  CLINICAL IMPRESSION: Patient seen for skilled PT session with emphasis on dynamic standing balance and coordination tasks. Patient improving with anticipatory and reactionary balance, but remains deficient to prevent a fall given major balance perturbations. Continue POC.   OBJECTIVE IMPAIRMENTS Abnormal gait, decreased balance, decreased coordination, decreased mobility, difficulty walking, and decreased safety awareness.   ACTIVITY LIMITATIONS lifting, standing, stairs, transfers, reach over head, and locomotion level  PARTICIPATION LIMITATIONS: cleaning, laundry, driving, shopping, community activity, and yard work  [place] Age, Fitness, Time since onset of injury/illness/exacerbation, and 1-2 comorbidities: cerebellar ataxia and history of TIA  are also affecting patient's functional outcome.   REHAB POTENTIAL: Good  CLINICAL DECISION MAKING: Evolving/moderate complexity  EVALUATION COMPLEXITY: Moderate  PLAN: PT FREQUENCY: 2x/week  PT DURATION: 6 weeks  PLANNED INTERVENTIONS: Therapeutic exercises, Therapeutic activity, Neuromuscular re-education, Balance training, Gait training, Patient/Family education, Self Care, Stair training, Vestibular training, Canalith repositioning, DME instructions, Aquatic Therapy, Manual therapy, and Re-evaluation  PLAN FOR NEXT SESSION: , Retro gait, Eccentric heel taps, Plyometrics, safety w/gait and transfers (he uses SPC at home), continue to practice w/ weighted vest (I used 15#), LE coordination, floor transfer, gait with theraband, tall-kneeling? Rockerboard and unlevel surfaces, turns, update HEP. Turns   Debbora Dus, PT, DPT,  CBIS 03/29/22, 11:49 AM

## 2022-04-01 ENCOUNTER — Ambulatory Visit: Payer: Medicare Other | Admitting: Physical Therapy

## 2022-04-01 ENCOUNTER — Encounter: Payer: Self-pay | Admitting: Physical Therapy

## 2022-04-01 DIAGNOSIS — R2681 Unsteadiness on feet: Secondary | ICD-10-CM | POA: Diagnosis not present

## 2022-04-01 DIAGNOSIS — E7800 Pure hypercholesterolemia, unspecified: Secondary | ICD-10-CM | POA: Diagnosis not present

## 2022-04-01 DIAGNOSIS — M8580 Other specified disorders of bone density and structure, unspecified site: Secondary | ICD-10-CM | POA: Diagnosis not present

## 2022-04-01 DIAGNOSIS — R278 Other lack of coordination: Secondary | ICD-10-CM

## 2022-04-01 DIAGNOSIS — Z9181 History of falling: Secondary | ICD-10-CM | POA: Diagnosis not present

## 2022-04-01 DIAGNOSIS — R260 Ataxic gait: Secondary | ICD-10-CM

## 2022-04-01 DIAGNOSIS — R270 Ataxia, unspecified: Secondary | ICD-10-CM | POA: Diagnosis not present

## 2022-04-01 DIAGNOSIS — M6281 Muscle weakness (generalized): Secondary | ICD-10-CM | POA: Diagnosis not present

## 2022-04-01 DIAGNOSIS — R7989 Other specified abnormal findings of blood chemistry: Secondary | ICD-10-CM | POA: Diagnosis not present

## 2022-04-01 DIAGNOSIS — Z125 Encounter for screening for malignant neoplasm of prostate: Secondary | ICD-10-CM | POA: Diagnosis not present

## 2022-04-01 DIAGNOSIS — I10 Essential (primary) hypertension: Secondary | ICD-10-CM | POA: Diagnosis not present

## 2022-04-01 NOTE — Therapy (Signed)
OUTPATIENT PHYSICAL THERAPY NEURO TREATMENT   Patient Name: Derrick White MRN: [id number] DOB:1941-01-27, 81 y.o., male Today's Date: 04/01/2022   PCP: Haywood Pao, MD REFERRING PROVIDER: Britt Bottom, MD    PT End of Session - 04/01/22 1132     Visit Number 14    Number of Visits 21    Date for PT Re-Evaluation 04/22/22    Authorization Type Medicare    Progress Note Due on Visit 20    PT Start Time 1102    PT Stop Time 1142    PT Time Calculation (min) 40 min    Equipment Utilized During Treatment Gait belt    Activity Tolerance Patient tolerated treatment well    Behavior During Therapy [facility] for tasks assessed/performed;Impulsive              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   Cerebellar ataxia ([place])    Dysesthesia 06/04/2016   Essential hypertension 11/05/2019   Foot pain, left 06/04/2016   Gait disturbance 03/16/2019   History of cervical spinal surgery 06/04/2016   History of hiatal hernia    History of kidney stones    Hypertension    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   Memory loss 03/16/2019   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   Pain due to onychomycosis of toenails of both feet 04/28/2019   Pain in left ankle and joints of left foot 08/06/2018   Slurred speech 03/16/2019   Superficial peroneal nerve neuropathy, 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   TIA (transient ischemic attack) 11/05/2019   Worsened handwriting 03/16/2019   Past Surgical History:  Procedure Laterality Date   [place] N/A 09/13/2020   Procedure: CARDIOVERSION;  Surgeon: Sanda Klein, MD;  Location: [place];  Service: Cardiovascular;  Laterality: N/A;   FRACTURE SURGERY     Left ankle, sx x4   INGUINAL HERNIA REPAIR Right 03/05/2019   Procedure: OPEN RIGHT INGUINAL HERNIA REPAIR WITH MESH;  Surgeon: Clovis Riley, MD;  Location: [place];  Service: General;  Laterality: Right;    [place] N/A 07/27/2020   Procedure: LEFT ATRIAL APPENDAGE OCCLUSION;  Surgeon: Sherren Mocha, MD;  Location: [place] [facility];  Service: Cardiovascular;  Laterality: N/A;   SHOULDER SURGERY Left    TEE WITHOUT CARDIOVERSION N/A 07/27/2020   Procedure: TRANSESOPHAGEAL ECHOCARDIOGRAM (TEE);  Surgeon: Sherren Mocha, MD;  Location: [place] [facility];  Service: Cardiovascular;  Laterality: N/A;   TEE WITHOUT CARDIOVERSION N/A 09/13/2020   Procedure: TRANSESOPHAGEAL ECHOCARDIOGRAM (TEE);  Surgeon: Sanda Klein, MD;  [facility];  Service: Cardiovascular;  Laterality: N/A;   TONSILLECTOMY     1945   Patient Active Problem List   Diagnosis Date Noted   Pain in right shoulder 07/31/2021   Persistent atrial fibrillation ([place]) 08/28/2020   Secondary hypercoagulable state ([place]) 07/20/2020   Paroxysmal atrial fibrillation ([place]) 06/19/2020   TIA (transient ischemic attack) 11/05/2019   Essential hypertension 11/05/2019   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/05/2019   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   C6 radiculopathy 05/04/2019   Pain due to onychomycosis of toenails of both feet 04/28/2019   Worsened handwriting 03/16/2019   Gait disturbance 03/16/2019   Slurred speech 03/16/2019   Memory loss 03/16/2019   Pain in left ankle and joints of left foot 08/06/2018   Mild cognitive impairment  12/12/2017   Foot pain, left 06/04/2016   Dysesthesia 06/04/2016   Superficial peroneal nerve neuropathy, 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   History of cervical spinal surgery 06/04/2016    ONSET DATE: 02/06/2022   REFERRING DIAG: R26.9 (ICD-10-CM) - Gait disturbance G11.9 (ICD-10-CM) - Cerebellar ataxia (HCC)   THERAPY DIAG:  Unsteadiness on feet  Ataxic gait  Other lack of coordination  Muscle weakness (generalized)  History of falling  Rationale for Evaluation and Treatment Rehabilitation  SUBJECTIVE:                                                                                                                                                                                               SUBJECTIVE STATEMENT:  Feeling great today, no pain or anything. Would still like to work on my balance. No falls but I do have a lot of close calls, I don't know why different reasons, sometimes dragging my feet and backing up causes it.   Pt accompanied by: self   PERTINENT HISTORY:  C6 radiculopathy, Cerebellar ataxia ([place]), Dysesthesia, Essential hypertension, Foot pain (left), Gait disturbance, History of cervical spinal surgery (06/04/2016), History of hiatal hernia, History of kidney stones, 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, Memory loss, Mild cognitive impairment, Pain due to onychomycosis of toenails of both feet, Pain in left ankle and joints of left foot, Slurred speech, Superficial peroneal nerve neuropathy, left (06/04/2009),TIA (transient ischemic attack) 11/05/2019. NCV/EMG study did not show any evidence of motor neuron disease. We did the Athena SCA panel (30+ genes). It was borderline. 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 Unclear if this is playing a role (SCA type 6 has mutations in this [place] but usually triplet repeat)   PAIN:  Are you having pain? No  PRECAUTIONS: Fall  FALLS: Has patient fallen in last 6 months? Yes. Number of falls around 3, mostly inside his home all in retro direction  PLOF: Independent with basic ADLs and Requires assistive device for independence  PATIENT GOALS "balance"  OBJECTIVE:   DIAGNOSTIC FINDINGS: NCV/EMG study 05/04/2019 showed the following: 1.   Chronic right C6 radiculopathy.  There could also be milder C5 and C7 chronic radiculopathies. 2.   Chronic left S1 radiculopathy. 3.   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   There was no evidence of polyneuropathy, motor neuron  disease or myopathy.  COORDINATION: Heel to shin test: Dysmetria bilaterally    POSTURE: rounded shoulders, forward head, increased thoracic kyphosis, and posterior pelvic tilt   TODAY'S TREATMENT:  04/01/22  NMR   Tandem stance solid surface 3x30 seconds MinA  Narrow BOS EO head turns 60 seconds 4 rounds (1 on solid surface, 3 on blue foam pad) Narrow BOS EC 3x30 seconds up to ModA/Mod cues to correct anterior lean Backwards gait in // bars x4 laps no UEs MinA External perturbations on blue foam pad 3x60 seconds with increasing speed and intensity of pertubations  Stepping over 4 inch hurdles with light BUE support/MinA 3 rounds in // bars Alternating toe taps to red/yellow targets solid surface MinA for balance x20     PATIENT EDUCATION: Education details: continue HEP Person educated: Patient Education method: Customer service manager Education comprehension: verbalized understanding   HOME EXERCISE PROGRAM: Access Code: [email or web address] URL: [email or web address] Date: 02/11/2022 Prepared by: Mickie Bail Plaster  Exercises (using red theraband)  - Sit to Stand Without Arm Support  - 1 x daily - 7 x weekly - 3 sets - 10 reps - Step sideways with arms reaching at counter   - 1 x daily - 7 x weekly - 3 sets - 10 reps - Standing Balance Activity: Plyometric Modified Lower Extremity Jumping Jack  - 1 x daily - 7 x weekly - 3 sets - 10 reps - [employer] on Counter  - 1 x daily - 7 x weekly - 3 sets - 10 reps - Side Stepping with Resistance at [employer] and Counter Support  - 1 x daily - 7 x weekly - 3 sets - 10 reps - Forward Backward Monster Walk with Band at [employer] and Counter Support  - 1 x daily - 7 x weekly - 3 sets - 10 reps    GOALS: Goals reviewed with patient? Yes  SHORT TERM GOALS: Target date: 03/01/2022  Pt will be independent with initial HEP for improved strength, balance, transfers and gait.  Baseline: Not established on eval  Goal  status: MET  2.  Pt will improve 5 x STS to less than or equal to 15 seconds without bracing against chair or >2 instances of retropulsion to demonstrate improved functional strength and transfer efficiency.   Baseline: 14.6s without UE support, significant retropulsion and bracing against chair w/BLEs; 20.69s without UE support  Goal status: IN PROGRESS  3.  Pt will improve Berg score to 41/56 for decreased fall risk  Baseline: 37/56; 48/56  Goal status: MET  4.  Pt will perform floor transfer and LTG updated as appropriate  Baseline:  Goal status: MET   LONG TERM GOALS: Target date: 03/29/2022 (updated to match cert date)  Pt will be independent with final HEP for improved strength, balance, transfers and gait.  Baseline:  Goal status: INITIAL  2.  Pt will improve Berg score to 45/56 for decreased fall risk  Baseline: 37/56; 48/56  Goal status: MET  3.  Pt will improve normal TUG to less than or equal to 14 seconds w/LRAD for improved functional mobility and decreased fall risk.  Baseline: 18.31s w/rollator; 18.37s w/rollator; 21.06s with rollator Goal status: IN PROGRESS  4.  Floor transfer goal  Baseline: assessed with previous PT (ModI/supervision)  Goal status: goal not needed  5.  Pt will improve gait velocity to at least 2.9 ft/s wit LRAD for improved gait efficiency  Baseline: 2.45 ft/s w/LRAD; 2.09 ft/s on 11/1; 2.31f/s  Goal status: IN PROGRESS  6. Pt will improve 5 x STS to less than or equal to 15 seconds without UE support to demonstrate improved functional strength and transfer efficiency.  Baseline: 20.69s  without UE support on 10/11; 17.15s without UE support on 11/1; 14.31s no UE  Goal Status: MET  NEW LONG TERM GOALS: Target date: 04/22/22 1. Pt will be independent with final HEP for improved strength, balance, transfers and gait.  Baseline:  Goal status: INITIAL  2.  Pt will improve Berg score to 52/56 for decreased fall risk  Baseline:  37/56; 48/56  Goal status: INITIAL  3.  Pt will improve normal TUG to less than or equal to 14 seconds w/LRAD for improved functional mobility and decreased fall risk.  Baseline: 18.31s w/rollator; 18.37s w/rollator  Goal status: IN PROGRESS   4.  Pt will improve gait velocity to at least 2.9 ft/s wit LRAD for improved gait efficiency  Baseline: 2.45 ft/s w/LRAD; 2.09 ft/s on 11/1  Goal status: IN PROGRESS  5. Pt will improve 5 x STS to less than or equal to 15 seconds without UE support to demonstrate improved functional strength and transfer efficiency.  Baseline: 20.69s without UE support on 10/11; 17.15s without UE support on 11/1  Goal Status: IN PROGRESS    ASSESSMENT:  CLINICAL IMPRESSION:  Kellis arrives today doing OK, we focused today's session on balance training with progressions as able this morning. Did need up to [facility] at most for challenging balance activities especially when we worked on correcting balance loss posteriorly. Will continue to progress as able.   OBJECTIVE IMPAIRMENTS Abnormal gait, decreased balance, decreased coordination, decreased mobility, difficulty walking, and decreased safety awareness.   ACTIVITY LIMITATIONS lifting, standing, stairs, transfers, reach over head, and locomotion level  PARTICIPATION LIMITATIONS: cleaning, laundry, driving, shopping, community activity, and yard work  [address] Age, Fitness, Time since onset of injury/illness/exacerbation, and 1-2 comorbidities: cerebellar ataxia and history of TIA  are also affecting patient's functional outcome.   REHAB POTENTIAL: Good  CLINICAL DECISION MAKING: Evolving/moderate complexity  EVALUATION COMPLEXITY: Moderate  PLAN: PT FREQUENCY: 2x/week  PT DURATION: 6 weeks  PLANNED INTERVENTIONS: Therapeutic exercises, Therapeutic activity, Neuromuscular re-education, Balance training, Gait training, Patient/Family education, Self Care, Stair training, Vestibular training,  Canalith repositioning, DME instructions, Aquatic Therapy, Manual therapy, and Re-evaluation  PLAN FOR NEXT SESSION: , Retro gait, Eccentric heel taps, Plyometrics, safety w/gait and transfers (he uses SPC at home), continue to practice w/ weighted vest (I used 15#), LE coordination, floor transfer, gait with theraband, tall-kneeling? Rockerboard and unlevel surfaces, turns, update HEP. Turns     U PT DPT PN2  04/01/2022, 11:49 AM

## 2022-04-04 ENCOUNTER — Encounter (HOSPITAL_COMMUNITY): Payer: Self-pay | Admitting: Nurse Practitioner

## 2022-04-04 ENCOUNTER — Ambulatory Visit (HOSPITAL_COMMUNITY)
Admission: RE | Admit: 2022-04-04 | Discharge: 2022-04-04 | Disposition: A | Discharge status: Home / Self Care | Payer: Medicare Other | Source: Ambulatory Visit | Attending: Nurse Practitioner | Admitting: Nurse Practitioner

## 2022-04-04 VITALS — BP 162/76 | HR 54 | Ht 70.0 in | Wt 182.0 lb

## 2022-04-04 DIAGNOSIS — I10 Essential (primary) hypertension: Secondary | ICD-10-CM | POA: Diagnosis not present

## 2022-04-04 DIAGNOSIS — Z8673 Personal history of transient ischemic attack (TIA), and cerebral infarction without residual deficits: Secondary | ICD-10-CM | POA: Diagnosis not present

## 2022-04-04 DIAGNOSIS — I4819 Other persistent atrial fibrillation: Secondary | ICD-10-CM | POA: Diagnosis not present

## 2022-04-04 DIAGNOSIS — Z7982 Long term (current) use of aspirin: Secondary | ICD-10-CM | POA: Insufficient documentation

## 2022-04-04 DIAGNOSIS — I440 Atrioventricular block, first degree: Secondary | ICD-10-CM | POA: Diagnosis not present

## 2022-04-04 DIAGNOSIS — Z79899 Other long term (current) drug therapy: Secondary | ICD-10-CM | POA: Insufficient documentation

## 2022-04-04 NOTE — Progress Notes (Signed)
Primary Care Physician: Tisovec, Fransico Him, MD Referring Physician: Dr. Josefine Class is a 81 y.o. male with a h/o afib, HTN, TIA, s/p Watchman device 2022, on amiodarone, that is in the afib clinic for amiodarone surveillance. EKG today shows Sinus rhythm. He has not noted any afib. No complaints voiced. He had labs drawn with PCP this past week as will call to get report on those. He continues on amiodarone 200 mg daily. Prior CVA and  walks with a walker.    Today, he denies symptoms of palpitations, chest pain, shortness of breath, orthopnea, PND, lower extremity edema, dizziness, presyncope, syncope, or neurologic sequela. The patient is tolerating medications without difficulties and is otherwise without complaint today.   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   Cerebellar ataxia ([place])    Dysesthesia 06/04/2016   Essential hypertension 11/05/2019   Foot pain, left 06/04/2016   Gait disturbance 03/16/2019   History of cervical spinal surgery 06/04/2016   History of hiatal hernia    History of kidney stones    Hypertension    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   Memory loss 03/16/2019   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   Pain due to onychomycosis of toenails of both feet 04/28/2019   Pain in left ankle and joints of left foot 08/06/2018   Slurred speech 03/16/2019   Superficial peroneal nerve neuropathy, 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   TIA (transient ischemic attack) 11/05/2019   Worsened handwriting 03/16/2019   Past Surgical History:  Procedure Laterality Date   [place] N/A 09/13/2020   Procedure: CARDIOVERSION;  Surgeon: Sanda Klein, MD;  Location: [facility];  Service: Cardiovascular;  Laterality: N/A;   FRACTURE SURGERY     Left ankle, sx x4   INGUINAL HERNIA REPAIR Right 03/05/2019   Procedure: OPEN RIGHT INGUINAL HERNIA REPAIR WITH MESH;  Surgeon: Clovis Riley, MD;  Location: [facility];  Service: General;  Laterality: Right;   [place] N/A 07/27/2020   Procedure: LEFT ATRIAL APPENDAGE OCCLUSION;  Surgeon: Sherren Mocha, MD;  Location: [facility];  Service: Cardiovascular;  Laterality: N/A;   SHOULDER SURGERY Left    [place] WITHOUT CARDIOVERSION N/A 07/27/2020   Procedure: TRANSESOPHAGEAL ECHOCARDIOGRAM (TEE);  Surgeon: Sherren Mocha, MD;  Location: [facility];  Service: Cardiovascular;  Laterality: N/A;   [place] WITHOUT CARDIOVERSION N/A 09/13/2020   Procedure: TRANSESOPHAGEAL ECHOCARDIOGRAM (TEE);  Surgeon: Sanda Klein, MD;  [facility];  Service: Cardiovascular;  Laterality: N/A;   TONSILLECTOMY     1945    Current Outpatient Medications  Medication Sig Dispense Refill   acetaminophen (TYLENOL) 500 MG tablet Take 500 mg by mouth every 6 (six) hours as needed for moderate pain or headache.     amiodarone (PACERONE) 200 MG tablet Take 1 tablet (200 mg total) by mouth daily. 90 tablet 3   amLODipine (NORVASC) 2.5 MG tablet TAKE ONE TABLET BY MOUTH TWICE A DAY 180 tablet 2   aspirin EC 81 MG tablet Take 1 tablet (81 mg total) by mouth daily. Swallow whole. 90 tablet 3   atorvastatin (LIPITOR) 40 MG tablet TAKE 1 TABLET DAILY 90 tablet 2   Cholecalciferol 25 MCG (1000 UT) tablet Take 1,000 Units by mouth at bedtime.     diphenhydramine-acetaminophen (TYLENOL PM) 25-500 MG TABS tablet  Take 1 tablet by mouth at bedtime as needed (sleep).     fluticasone (FLONASE) 50 MCG/ACT nasal spray Place 1 spray into both nostrils daily as needed for allergies or rhinitis.     neomycin-polymyxin b-dexamethasone (MAXITROL) 3.5-10000-0.1 SUSP SMARTSIG:In Eye(s)     nystatin (MYCOSTATIN) 100000 UNIT/ML suspension Take 5 mLs by mouth 4 (four) times daily.     pantoprazole (PROTONIX) 40 MG tablet Take 40 mg by mouth in the morning.  2   Probiotic Product (ALIGN) 4 MG CAPS Take by mouth in the  morning.     Propylene Glycol (SYSTANE BALANCE) 0.6 % SOLN Place 1 drop into both eyes 2 (two) times daily as needed (burning).     vitamin B-12 (CYANOCOBALAMIN) 1000 MCG tablet Take 1,000 mcg by mouth at bedtime.     vitamin E 180 MG (400 UNITS) capsule Take 400 Units by mouth at bedtime.     No current facility-administered medications for this encounter.    Allergies  Allergen Reactions   Morphine Hives and Itching   Codeine Hives and Itching    Other reaction(s): bad feeling   Propoxyphene Itching    DARVOCET   Shrimp [Shellfish Allergy] Rash    Social History   Socioeconomic History   Marital status: Married    Spouse name: Not on file   Number of children: Not on file   Years of education: Not on file   Highest education level: Not on file  Occupational History   Occupation: retired  Tobacco Use   Smoking status: Former    Packs/day: 1.00    Years: 30.00    Total pack years: 30.00    Types: Cigarettes    Quit date: 1995    Years since quitting: 28.8   Smokeless tobacco: Never  Vaping Use   Vaping Use: Never used  Substance and Sexual Activity   Alcohol use: Yes    Alcohol/week: 16.0 standard drinks of alcohol    Types: 14 Cans of beer, 2 Standard drinks or equivalent per week    Comment: 2 beers a day   Drug use: Never   Sexual activity: Not on file  Other Topics Concern   Not on file  Social History Narrative   Not on file   Social Determinants of Health   Financial Resource Strain: Not on file  Food Insecurity: Not on file  Transportation Needs: Not on file  Physical Activity: Not on file  Stress: Not on file  Social Connections: Not on file  Intimate Partner Violence: Not on file    Family History  Problem Relation Age of Onset   Stroke Neg Hx     ROS- All systems are reviewed and negative except as per the HPI above  Physical Exam: There were no vitals filed for this visit. Wt Readings from Last 3 Encounters:  02/06/22 82.4 kg   10/05/21 82.9 kg  09/27/21 81.6 kg    Labs: Lab Results  Component Value Date   NA 141 09/27/2021   K 5.3 (H) 09/27/2021   CL 102 09/27/2021   CO2 25 09/27/2021   GLUCOSE 87 09/27/2021   BUN 22 09/27/2021   CREATININE 1.16 09/27/2021   CALCIUM 10.0 09/27/2021   Lab Results  Component Value Date   INR 1.1 11/05/2019   Lab Results  Component Value Date   CHOL 128 01/07/2020   HDL 50 01/07/2020   LDLCALC 58 01/07/2020   TRIG 107 01/07/2020     GEN- The patient  is well appearing, alert and oriented x 3 today.   Head- normocephalic, atraumatic Eyes-  Sclera clear, conjunctiva pink Ears- hearing intact Oropharynx- clear Neck- supple, no JVP Lymph- no cervical lymphadenopathy Lungs- Clear to ausculation bilaterally, normal work of breathing Heart- Regular rate and rhythm, no murmurs, rubs or gallops, PMI not laterally displaced GI- soft, NT, ND, + BS Extremities- no clubbing, cyanosis, or edema MS- no significant deformity or atrophy Skin- no rash or lesion Psych- euthymic mood, full affect Neuro- strength and sensation are intact  EKG-Vent. rate 54 BPM PR interval 220 ms QRS duration 102 ms QT/QTcB 478/453 ms P-R-T axes 73 19 68 Sinus bradycardia with 1st degree A-V block Otherwise normal ECG When compared with ECG of 21-Nov-2020 10:10, PREVIOUS ECG IS PRESENT    Assessment and Plan:  1. Afib Staying in SR  Continue amiodarone 200 mg daily Labs will be obtained from  PCP, PCP office would not answer phone when called, and if not what I need (cmet, tsh), pt is willing to come back   2. CHA2DS2VASc  score of 5 S/p Watchman 07/2020 On asa 81 mg  daily   F/u with Dr. Quentin Ore  in 6 months   Geroge Baseman. Randell Detter, [facility] [address] [place], [place] [address] [phone]

## 2022-04-05 ENCOUNTER — Ambulatory Visit: Payer: Medicare Other | Admitting: Physical Therapy

## 2022-04-05 DIAGNOSIS — M6281 Muscle weakness (generalized): Secondary | ICD-10-CM | POA: Diagnosis not present

## 2022-04-05 DIAGNOSIS — R2681 Unsteadiness on feet: Secondary | ICD-10-CM | POA: Diagnosis not present

## 2022-04-05 DIAGNOSIS — R278 Other lack of coordination: Secondary | ICD-10-CM | POA: Diagnosis not present

## 2022-04-05 DIAGNOSIS — R260 Ataxic gait: Secondary | ICD-10-CM

## 2022-04-05 DIAGNOSIS — R270 Ataxia, unspecified: Secondary | ICD-10-CM | POA: Diagnosis not present

## 2022-04-05 DIAGNOSIS — Z9181 History of falling: Secondary | ICD-10-CM | POA: Diagnosis not present

## 2022-04-05 NOTE — Therapy (Signed)
OUTPATIENT PHYSICAL THERAPY NEURO TREATMENT   Patient Name: Derrick White MRN: [id number] DOB:[birth year], 81 y.o., male Today's Date: 04/05/2022   PCP: Haywood Pao, MD REFERRING PROVIDER: Britt Bottom, MD    PT End of Session - 04/05/22 1108     Visit Number 15    Number of Visits 21    Date for PT Re-Evaluation 04/22/22    Authorization Type Medicare    Progress Note Due on Visit 34    PT Start Time 1105   Therapist running late   PT Stop Time 1145    PT Time Calculation (min) 40 min    Equipment Utilized During Treatment Gait belt    Activity Tolerance Patient tolerated treatment well    Behavior During Therapy [facility] for tasks assessed/performed;Impulsive              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   Cerebellar ataxia ([place])    Dysesthesia 06/04/2016   Essential hypertension 11/05/2019   Foot pain, left 06/04/2016   Gait disturbance 03/16/2019   History of cervical spinal surgery 06/04/2016   History of hiatal hernia    History of kidney stones    Hypertension    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   Memory loss 03/16/2019   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   Pain due to onychomycosis of toenails of both feet 04/28/2019   Pain in left ankle and joints of left foot 08/06/2018   Slurred speech 03/16/2019   Superficial peroneal nerve neuropathy, 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   TIA (transient ischemic attack) 11/05/2019   Worsened handwriting 03/16/2019   Past Surgical History:  Procedure Laterality Date   [place] N/A 09/13/2020   Procedure: CARDIOVERSION;  Surgeon: Sanda Klein, MD;  Location: [facility];  Service: Cardiovascular;  Laterality: N/A;   FRACTURE SURGERY     Left ankle, sx x4   INGUINAL HERNIA REPAIR Right 03/05/2019   Procedure: OPEN RIGHT INGUINAL HERNIA REPAIR WITH MESH;  Surgeon: Clovis Riley, MD;  Location: [facility];  Service: General;   Laterality: Right;   [place] N/A 07/27/2020   Procedure: LEFT ATRIAL APPENDAGE OCCLUSION;  Surgeon: Sherren Mocha, MD;  Location: [facility] [facility];  Service: Cardiovascular;  Laterality: N/A;   SHOULDER SURGERY Left    TE[place] WITHOUT CARDIOVERSION N/A 07/27/2020   Procedure: TRANSESOPHAGEAL ECHOCARDIOGRAM (TEE);  Surgeon: Sherren Mocha, MD;  Location: [facility];  Service: Cardiovascular;  Laterality: N/A;   TE[place] WITHOUT CARDIOVERSION N/A 09/13/2020   Procedure: TRANSESOPHAGEAL ECHOCARDIOGRAM (TEE);  Surgeon: Sanda Klein, MD;  Location: [facility];  Service: Cardiovascular;  Laterality: N/A;   TONSILLECTOMY     1945   Patient Active Problem List   Diagnosis Date Noted   Pain in right shoulder 07/31/2021   Persistent atrial fibrillation ([place]) 08/28/2020   Secondary hypercoagulable state ([place]) 07/20/2020   Paroxysmal atrial fibrillation ([place]) 06/19/2020   TIA (transient ischemic attack) 11/05/2019   Essential hypertension 11/05/2019   Cerebellar ataxia ([employer]) 11/05/2019   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   C6 radiculopathy 05/04/2019   Pain due to onychomycosis of toenails of both feet 04/28/2019   Worsened handwriting 03/16/2019   Gait disturbance 03/16/2019   Slurred speech 03/16/2019   Memory loss 03/16/2019   Pain in left ankle and joints of left foot 08/06/2018  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   Foot pain, left 06/04/2016   Dysesthesia 06/04/2016   Superficial peroneal nerve neuropathy, 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 No falls.   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 NCV/EMG study did not show any evidence of motor neuron disease. We did the [employer]na SCA panel (30+ genes). It was borderline. 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 Unclear if this is playing a role (SCA type 6 has mutations in this [place] but usually triplet repeat)   PAIN:  Are you having pain? No  PRECAUTIONS: Fall  FALLS: Has patient fallen in last 6 months? Yes. Number of falls around 3, mostly inside his home all in retro direction  PLOF: Independent with basic ADLs and Requires assistive device for independence  PATIENT GOALS "balance"  OBJECTIVE:   DIAGNOSTIC FINDINGS: NCV/EMG study 05/04/2019 showed the following: 1.   Chronic right C6 radiculopathy.  There could also be milder C5 and C7 chronic radiculopathies. 2.   Chronic left S1 radiculopathy. 3.   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   There was no evidence of polyneuropathy, motor neuron disease or myopathy.  COORDINATION: Heel to shin test: Dysmetria bilaterally    POSTURE: rounded shoulders, forward head, increased thoracic kyphosis, and posterior pelvic tilt   TODAY'S TREATMENT:    Ther Act   LTG Assessment     [facility] PT Assessment - 04/05/22 1113       Ambulation/Gait   Gait velocity 32.8' 13.06s = 2.2f/s with rollator  Timed Up and Go Test   Normal TUG (seconds) 19.47   w/rollator            NMR  In // bars for improved LE coordination, reactive balance strategies and lateral weight shifting:  Resisted fwd and retro gait w/two therapists, down and back in bars x8. Pt very challenged by retro direction and noted diminished R foot clearance throughout despite cues. Pt had more difficulty when pulled to R side compared to L and relied heavily on truncal lean to perform rather than BLE strength. Min A and intermittent UE support required throughout.  On blue mat, fwd and lateral 8" hurdle navigation x3 each direction. Min cues to reduce circumduction of RLE throughout. Pt tolerated fwd navigation well, alt leading leg each set. Pt had increased difficulty when going laterally, especially to R side, and required mod-max multimodal cues to avoid rotating away from lateral direction to make movement easier. Min A and intermittent UE support throughout.  Stepping stones, one foot per stone, x3 reps with intermittent UE support and mod A. Min cues to ensure foot is placed properly on stone, as pt placing his foot on grade rather than flat top and losing balance posteriorly. Pt w/significant difficulty motor planning each step length/clearance but improved w/practice.     PATIENT EDUCATION: Education details: continue HEP Person educated: Patient Education method: Customer service manager Education comprehension: verbalized understanding   HOME EXERCISE PROGRAM: Access Code: [email or web address] URL: [email or web address] Date: 02/11/2022 Prepared by: Mickie Bail Sora Olivo  Exercises (using red theraband)  - Sit to Stand Without Arm Support  - 1 x daily - 7 x weekly - 3 sets - 10 reps - Step sideways with arms reaching at counter   - 1 x daily - 7 x weekly  - 3 sets - 10 reps - Standing Balance Activity: Plyometric Modified Lower Extremity Jumping Jack  - 1 x daily - 7 x weekly - 3 sets - 10 reps - [employer] on Counter  - 1 x daily - 7 x weekly - 3 sets - 10 reps - Side Stepping with Resistance at [employer] and Counter Support  - 1 x daily - 7 x weekly - 3 sets - 10 reps - Forward Backward Monster Walk with Band at [employer] and Counter Support  - 1 x daily - 7 x weekly - 3 sets - 10 reps    GOALS: Goals reviewed with patient? Yes  SHORT TERM GOALS: Target date: 03/01/2022  Pt will be independent with initial HEP for improved strength, balance, transfers and gait.  Baseline: Not established on eval  Goal status: MET  2.  Pt will improve 5 x STS to less than or equal to 15 seconds without bracing against chair or >2 instances of retropulsion to demonstrate improved functional strength and transfer efficiency.   Baseline: 14.6s without UE support, significant retropulsion and bracing against chair w/BLEs; 20.69s without UE support  Goal status: IN PROGRESS  3.  Pt will improve Berg score to 41/56 for decreased fall risk  Baseline: 37/56; 48/56  Goal status: MET  4.  Pt will perform floor transfer and LTG updated as appropriate  Baseline:  Goal status: MET   LONG TERM GOALS: Target date: 03/29/2022 (updated to match cert date)  Pt will be independent with final HEP for improved strength, balance, transfers and gait.  Baseline:  Goal status: INITIAL  2.  Pt will improve Berg score to 45/56 for decreased fall risk  Baseline: 37/56; 48/56  Goal  status: MET  3.  Pt will improve normal TUG to less than or equal to 14 seconds w/LRAD for improved functional mobility and decreased fall risk.  Baseline: 18.31s w/rollator; 18.37s w/rollator; 21.06s with rollator; 19.47s w/rollator   Goal status: IN PROGRESS  4.  Floor transfer goal  Baseline: assessed with previous PT (ModI/supervision)  Goal status: goal not needed  5.  Pt  will improve gait velocity to at least 2.9 ft/s wit LRAD for improved gait efficiency  Baseline: 2.45 ft/s w/LRAD; 2.09 ft/s on 11/1; 2.40f/s; 2.5 ft/s w/rollator  Goal status: IN PROGRESS  6. Pt will improve 5 x STS to less than or equal to 15 seconds without UE support to demonstrate improved functional strength and transfer efficiency.  Baseline: 20.69s without UE support on 10/11; 17.15s without UE support on 11/1; 14.31s no UE  Goal Status: MET  NEW LONG TERM GOALS: Target date: 04/22/22 1. Pt will be independent with final HEP for improved strength, balance, transfers and gait.  Baseline:  Goal status: INITIAL  2.  Pt will improve Berg score to 52/56 for decreased fall risk  Baseline: 37/56; 48/56  Goal status: INITIAL  3.  Pt will improve normal TUG to less than or equal to 14 seconds w/LRAD for improved functional mobility and decreased fall risk.  Baseline: 18.31s w/rollator; 18.37s w/rollator  Goal status: IN PROGRESS   4.  Pt will improve gait velocity to at least 2.9 ft/s wit LRAD for improved gait efficiency  Baseline: 2.45 ft/s w/LRAD; 2.09 ft/s on 11/1  Goal status: IN PROGRESS  5. Pt will improve 5 x STS to less than or equal to 15 seconds without UE support to demonstrate improved functional strength and transfer efficiency.  Baseline: 20.69s without UE support on 10/11; 17.15s without UE support on 11/1  Goal Status: IN PROGRESS    ASSESSMENT:  CLINICAL IMPRESSION: Emphasis of skilled PT session on LE coordination, reactive balance strategies and reassessing safe use of AD. Pt continues to progress his gait speed and TUG and demonstrates improved turn management with rollator but still requires cues to slow down and stay close to AD. Pt very challenged by gait w/perturbations but does demonstrate good righting reactions. Continue POC.   OBJECTIVE IMPAIRMENTS Abnormal gait, decreased balance, decreased coordination, decreased mobility, difficulty walking, and  decreased safety awareness.   ACTIVITY LIMITATIONS lifting, standing, stairs, transfers, reach over head, and locomotion level  PARTICIPATION LIMITATIONS: cleaning, laundry, driving, shopping, community activity, and yard work  [place], Fitness, Time since onset of injury/illness/exacerbation, and 1-2 comorbidities: cerebellar ataxia and history of TIA  are also affecting patient's functional outcome.   REHAB POTENTIAL: Good  CLINICAL DECISION MAKING: Evolving/moderate complexity  EVALUATION COMPLEXITY: Moderate  PLAN: PT FREQUENCY: 2x/week  PT DURATION: 6 weeks  PLANNED INTERVENTIONS: Therapeutic exercises, Therapeutic activity, Neuromuscular re-education, Balance training, Gait training, Patient/Family education, Self Care, Stair training, Vestibular training, Canalith repositioning, DME instructions, Aquatic Therapy, Manual therapy, and Re-evaluation  PLAN FOR NEXT SESSION: , Retro gait, Eccentric heel taps, Plyometrics, safety w/gait and transfers (he uses SPC at home), continue to practice w/ weighted vest (I used 15#), LE coordination, floor transfer, gait with theraband, tall-kneeling? Rockerboard and unlevel surfaces, turns, update HEP. [facility], [id number]  [id number]:  [phone]:  [phone]/17/2023, 12:15 PM

## 2022-04-08 DIAGNOSIS — M8580 Other specified disorders of bone density and structure, unspecified site: Secondary | ICD-10-CM | POA: Diagnosis not present

## 2022-04-08 DIAGNOSIS — E7800 Pure hypercholesterolemia, unspecified: Secondary | ICD-10-CM | POA: Diagnosis not present

## 2022-04-08 DIAGNOSIS — Z0000 Encounter for general adult medical examination without abnormal findings: Secondary | ICD-10-CM | POA: Diagnosis not present

## 2022-04-08 DIAGNOSIS — R1312 Dysphagia, oropharyngeal phase: Secondary | ICD-10-CM | POA: Diagnosis not present

## 2022-04-08 DIAGNOSIS — D6869 Other thrombophilia: Secondary | ICD-10-CM | POA: Diagnosis not present

## 2022-04-08 DIAGNOSIS — Z1331 Encounter for screening for depression: Secondary | ICD-10-CM | POA: Diagnosis not present

## 2022-04-08 DIAGNOSIS — G3184 Mild cognitive impairment, so stated: Secondary | ICD-10-CM | POA: Diagnosis not present

## 2022-04-08 DIAGNOSIS — D126 Benign neoplasm of colon, unspecified: Secondary | ICD-10-CM | POA: Diagnosis not present

## 2022-04-08 DIAGNOSIS — G119 Hereditary ataxia, unspecified: Secondary | ICD-10-CM | POA: Diagnosis not present

## 2022-04-08 DIAGNOSIS — G9050 Complex regional pain syndrome I, unspecified: Secondary | ICD-10-CM | POA: Diagnosis not present

## 2022-04-08 DIAGNOSIS — G5732 Lesion of lateral popliteal nerve, left lower limb: Secondary | ICD-10-CM | POA: Diagnosis not present

## 2022-04-08 DIAGNOSIS — I4819 Other persistent atrial fibrillation: Secondary | ICD-10-CM | POA: Diagnosis not present

## 2022-04-08 DIAGNOSIS — R82998 Other abnormal findings in urine: Secondary | ICD-10-CM | POA: Diagnosis not present

## 2022-04-08 DIAGNOSIS — M5136 Other intervertebral disc degeneration, lumbar region: Secondary | ICD-10-CM | POA: Diagnosis not present

## 2022-04-08 DIAGNOSIS — M1990 Unspecified osteoarthritis, unspecified site: Secondary | ICD-10-CM | POA: Diagnosis not present

## 2022-04-08 DIAGNOSIS — Z1339 Encounter for screening examination for other mental health and behavioral disorders: Secondary | ICD-10-CM | POA: Diagnosis not present

## 2022-04-09 ENCOUNTER — Ambulatory Visit: Payer: Medicare Other | Admitting: Physical Therapy

## 2022-04-09 DIAGNOSIS — Z9181 History of falling: Secondary | ICD-10-CM | POA: Diagnosis not present

## 2022-04-09 DIAGNOSIS — R278 Other lack of coordination: Secondary | ICD-10-CM | POA: Diagnosis not present

## 2022-04-09 DIAGNOSIS — M6281 Muscle weakness (generalized): Secondary | ICD-10-CM | POA: Diagnosis not present

## 2022-04-09 DIAGNOSIS — R2681 Unsteadiness on feet: Secondary | ICD-10-CM | POA: Diagnosis not present

## 2022-04-09 DIAGNOSIS — R270 Ataxia, unspecified: Secondary | ICD-10-CM | POA: Diagnosis not present

## 2022-04-09 DIAGNOSIS — R260 Ataxic gait: Secondary | ICD-10-CM | POA: Diagnosis not present

## 2022-04-09 NOTE — Therapy (Signed)
OUTPATIENT PHYSICAL THERAPY NEURO TREATMENT   Patient Name: Derrick White MRN: [id number] DOB:10/21/1940, 81 y.o., male Today's Date: 04/09/2022   PCP: Haywood Pao, MD REFERRING PROVIDER: Britt Bottom, MD    PT End of Session - 04/09/22 1020     Visit Number 16    Number of Visits 21    Date for PT Re-Evaluation 04/22/22    Authorization Type Medicare    Progress Note Due on Visit 20    PT Start Time 1017    PT Stop Time 1059    PT Time Calculation (min) 42 min    Equipment Utilized During Treatment Gait belt    Activity Tolerance Patient tolerated treatment well    Behavior During Therapy WFL for tasks assessed/performed;Impulsive              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   Cerebellar ataxia ([place])    Dysesthesia 06/04/2016   Essential hypertension 11/05/2019   Foot pain, left 06/04/2016   Gait disturbance 03/16/2019   History of cervical spinal surgery 06/04/2016   History of hiatal hernia    History of kidney stones    Hypertension    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   Memory loss 03/16/2019   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   Pain due to onychomycosis of toenails of both feet 04/28/2019   Pain in left ankle and joints of left foot 08/06/2018   Slurred speech 03/16/2019   Superficial peroneal nerve neuropathy, 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   TIA (transient ischemic attack) 11/05/2019   Worsened handwriting 03/16/2019   Past Surgical History:  Procedure Laterality Date   [place] N/A 09/13/2020   Procedure: CARDIOVERSION;  Surgeon: Sanda Klein, MD;  Location: [facility];  Service: Cardiovascular;  Laterality: N/A;   FRACTURE SURGERY     Left ankle, sx x4   INGUINAL HERNIA REPAIR Right 03/05/2019   Procedure: OPEN RIGHT INGUINAL HERNIA REPAIR WITH MESH;  Surgeon: Clovis Riley, MD;  Location: [facility];  Service: General;  Laterality: Right;    [place] N/A 07/27/2020   Procedure: LEFT ATRIAL APPENDAGE OCCLUSION;  Surgeon: Sherren Mocha, MD;  Location: [facility];  Service: Cardiovascular;  Laterality: N/A;   SHOULDER SURGERY Left    [place] WITHOUT CARDIOVERSION N/A 07/27/2020   Procedure: TRANSESOPHAGEAL ECHOCARDIOGRAM (TEE);  Surgeon: Sherren Mocha, MD;  Location: [facility];  Service: Cardiovascular;  Laterality: N/A;   TE[place] WITHOUT CARDIOVERSION N/A 09/13/2020   Procedure: TRANSESOPHAGEAL ECHOCARDIOGRAM (TEE);  Surgeon: Sanda Klein, MD;  Location: [facility];  Service: Cardiovascular;  Laterality: N/A;   TONSILLECTOMY     1945   Patient Active Problem List   Diagnosis Date Noted   Pain in right shoulder 07/31/2021   Persistent atrial fibrillation ([place]) 08/28/2020   Secondary hypercoagulable state ([place]) 07/20/2020   Paroxysmal atrial fibrillation ([place]) 06/19/2020   TIA (transient ischemic attack) 11/05/2019   Essential hypertension 11/05/2019   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/05/2019   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   C6 radiculopathy 05/04/2019   Pain due to onychomycosis of toenails of both feet 04/28/2019   Worsened handwriting 03/16/2019   Gait disturbance 03/16/2019   Slurred speech 03/16/2019   Memory loss 03/16/2019   Pain in left ankle and joints of left foot 08/06/2018   Mild cognitive impairment  12/12/2017   Foot pain, left 06/04/2016   Dysesthesia 06/04/2016   Superficial peroneal nerve neuropathy, 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 No falls.   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 NCV/EMG study did not show any evidence of motor neuron disease. We did the [employer] SCA panel (30+ genes). It was borderline. 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 No  PRECAUTIONS: Fall  FALLS: Has patient fallen in last 6 months? Yes. Number of falls around 3, mostly inside his home all in retro direction  PLOF: Independent with basic ADLs and Requires assistive device for independence  PATIENT GOALS "balance"  OBJECTIVE:   DIAGNOSTIC FINDINGS: NCV/EMG study 05/04/2019 showed the following: 1.   Chronic right C6 radiculopathy.  There could also be milder C5 and C7 chronic radiculopathies. 2.   Chronic left S1 radiculopathy. 3.   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   There was no evidence of polyneuropathy, motor neuron disease or myopathy.  COORDINATION: Heel to shin test: Dysmetria bilaterally    POSTURE: rounded shoulders, forward head, increased thoracic kyphosis, and posterior pelvic tilt   TODAY'S TREATMENT:    Ther Ex    SciFit multi-peaks level 7 for 8 minutes using BUE/BLEs for neural priming for reciprocal movement, dynamic cardiovascular warmup and increased amplitude of stepping.   NMR  Sit to stands on airex x10 without  UE support for immediate standing balance and BLE strength. Min verbal cues for anterior weight shifting and to reduce posterior bracing on mat. Also cued pt to slow down, as he was more likely to lose balance when moving too quickly. Progressed to sit <>stands on airex w/10# med ball throw for improved reactive balance strategies, x12 reps. Pt unable to use momentum to facilitate ball throw to reach mat target despite max cues, but no posterior LOB noted as the medicine ball slowed pt down.  Resisted walking w/random perturbations in fwd and retro directions, 115' each direction, CGA-min A throughout. Mod verbal cues for increased step length (RLE when going fwd, LLE when retro) and to take slow, large steps. Pt demonstrated increased difficulty in retro direction but improved his step length/symmetry w/added distance. No major LOB noted as pt demonstrates good righting reactions  Standing pallof presses using red theraband, x12 per side, for improved core stability and dynamic standing balance. Pt performed well when band biased to R side but required visual target to properly perform when biased to L side due to core weakness. No LOB noted.     PATIENT EDUCATION: Education details: continue HEP Person educated: Patient Education method: Customer service manager Education comprehension: verbalized understanding   HOME EXERCISE PROGRAM: Access Code: [email or web address] URL: [email or web address] Date: 02/11/2022 Prepared by: Mickie Bail Tejay Hubert  Exercises (using red theraband)  - Sit to Stand Without Arm Support  - 1 x daily - 7 x weekly - 3 sets - 10 reps - Step sideways with arms reaching at counter   - 1 x daily - 7 x weekly - 3 sets - 10 reps - Standing Balance  Activity: Plyometric Modified Lower Extremity Jumping Jack  - 1 x daily - 7 x weekly - 3 sets - 10 reps - [employer] on Counter  - 1 x daily - 7 x weekly - 3 sets - 10 reps - Side Stepping with Resistance at [employer] and Counter Support  - 1 x daily - 7 x weekly - 3 sets - 10 reps - Forward Backward Monster Walk with Band at [employer] and Counter Support  - 1 x daily - 7 x weekly - 3 sets - 10 reps    GOALS: Goals reviewed with patient? Yes  SHORT TERM GOALS: Target date: 03/01/2022  Pt will be independent with initial HEP for improved strength, balance, transfers and gait.  Baseline: Not established on eval  Goal status: MET  2.  Pt will improve 5 x STS to less than or equal to 15 seconds without bracing against chair or >2 instances of retropulsion to demonstrate improved functional strength and transfer efficiency.   Baseline: 14.6s without UE support, significant retropulsion and bracing against chair w/BLEs; 20.69s without UE support  Goal status: IN PROGRESS  3.  Pt will improve Berg score to 41/56 for decreased fall risk  Baseline: 37/56; 48/56  Goal status: MET  4.  Pt will perform floor transfer and LTG updated as appropriate  Baseline:  Goal status: MET   LONG TERM GOALS: Target date: 03/29/2022 (updated to match cert date)  Pt will be independent with final HEP for improved strength, balance, transfers and gait.  Baseline:  Goal status: INITIAL  2.  Pt will improve Berg score to 45/56 for decreased fall risk  Baseline: 37/56; 48/56  Goal status: MET  3.  Pt will improve normal TUG to less than or equal to 14 seconds w/LRAD for improved functional mobility and decreased fall risk.  Baseline: 18.31s w/rollator; 18.37s w/rollator; 21.06s with rollator; 19.47s w/rollator   Goal status: IN PROGRESS  4.  Floor transfer goal  Baseline: assessed with previous PT (ModI/supervision)  Goal status: goal not needed  5.  Pt will improve gait velocity to at least 2.9  ft/s wit LRAD for improved gait efficiency  Baseline: 2.45 ft/s w/LRAD; 2.09 ft/s on 11/1; 2.25f/s; 2.5 ft/s w/rollator  Goal status: IN PROGRESS  6. Pt will improve 5 x STS to less than or equal to 15 seconds without UE support to demonstrate improved functional strength and transfer efficiency.  Baseline: 20.69s without UE support on 10/11; 17.15s without UE support on 11/1; 14.31s no UE  Goal Status: MET  NEW LONG TERM GOALS: Target date: 04/22/22 1. Pt will be independent with final HEP for improved strength, balance, transfers and gait.  Baseline:  Goal status: INITIAL  2.  Pt will improve Berg score to 52/56 for decreased fall risk  Baseline: 37/56; 48/56  Goal status: INITIAL  3.  Pt will improve normal TUG to less than or equal to 14 seconds w/LRAD for improved functional mobility and decreased fall risk.  Baseline: 18.31s w/rollator; 18.37s w/rollator  Goal status: IN PROGRESS   4.  Pt will improve gait velocity to at least 2.9 ft/s wit LRAD for improved gait efficiency  Baseline: 2.45 ft/s w/LRAD; 2.09 ft/s on 11/1  Goal status: IN PROGRESS  5. Pt will improve 5 x STS to less than or equal to 15 seconds without UE support to demonstrate improved functional strength and transfer efficiency.  Baseline: 20.69s without UE support on 10/11; 17.15s without UE support on 11/1  Goal Status: IN PROGRESS    ASSESSMENT:  CLINICAL IMPRESSION: Emphasis of skilled PT session on righting reactions, stepping strategies and dynamic balance. Pt continues to have difficulty w/retro gait but did improve with cues to slow down and focus on larger steps. Pt's impulsive movement contributes to his imbalance and pt continues to require min cues for safe use of AD and avoiding furniture walking at home. Continue POC.   OBJECTIVE IMPAIRMENTS Abnormal gait, decreased balance, decreased coordination, decreased mobility, difficulty walking, and decreased safety awareness.   ACTIVITY  LIMITATIONS lifting, standing, stairs, transfers, reach over head, and locomotion level  PARTICIPATION LIMITATIONS: cleaning, laundry, driving, shopping, community activity, and yard work  [place], Fitness, Time since onset of injury/illness/exacerbation, and 1-2 comorbidities: cerebellar ataxia and history of TIA  are also affecting patient's functional outcome.   REHAB POTENTIAL: Good  CLINICAL DECISION MAKING: Evolving/moderate complexity  EVALUATION COMPLEXITY: Moderate  PLAN: PT FREQUENCY: 2x/week  PT DURATION: 6 weeks  PLANNED INTERVENTIONS: Therapeutic exercises, Therapeutic activity, Neuromuscular re-education, Balance training, Gait training, Patient/Family education, Self Care, Stair training, Vestibular training, Canalith repositioning, DME instructions, Aquatic Therapy, Manual therapy, and Re-evaluation  PLAN FOR NEXT SESSION: , Retro gait, Eccentric heel taps, Plyometrics, safety w/gait and transfers (he uses SPC at home), continue to practice w/ weighted vest (I used 15#), LE coordination, floor transfer, gait with theraband, tall-kneeling? Rockerboard and unlevel surfaces, turns, update HEP. TGen[place] PT, [id number] Penderg[address] CourtSDestrehanGPerryville Perry  [id number]:  [phone]:  [phone]/21/2023, 10:59 AM

## 2022-04-10 DIAGNOSIS — Z86010 Personal history of colonic polyps: Secondary | ICD-10-CM | POA: Diagnosis not present

## 2022-04-15 ENCOUNTER — Ambulatory Visit: Payer: Medicare Other | Admitting: Physical Therapy

## 2022-04-15 DIAGNOSIS — Z9181 History of falling: Secondary | ICD-10-CM | POA: Diagnosis not present

## 2022-04-15 DIAGNOSIS — M6281 Muscle weakness (generalized): Secondary | ICD-10-CM

## 2022-04-15 DIAGNOSIS — R260 Ataxic gait: Secondary | ICD-10-CM

## 2022-04-15 DIAGNOSIS — R278 Other lack of coordination: Secondary | ICD-10-CM | POA: Diagnosis not present

## 2022-04-15 DIAGNOSIS — R2681 Unsteadiness on feet: Secondary | ICD-10-CM | POA: Diagnosis not present

## 2022-04-15 DIAGNOSIS — R270 Ataxia, unspecified: Secondary | ICD-10-CM | POA: Diagnosis not present

## 2022-04-15 NOTE — Therapy (Signed)
OUTPATIENT PHYSICAL THERAPY NEURO TREATMENT   Patient Name: Derrick White MRN: [id number] DOB:[birth year], 81 y.o., male Today's Date: 04/15/2022   PCP: Haywood Pao, MD REFERRING PROVIDER: Britt Bottom, MD    PT End of Session - 04/15/22 1150     Visit Number 17    Number of Visits 21    Date for PT Re-Evaluation 04/22/22    Authorization Type Medicare    Progress Note Due on Visit 20    PT Start Time 1148    PT Stop Time 1230    PT Time Calculation (min) 42 min    Equipment Utilized During Treatment Gait belt    Activity Tolerance Patient tolerated treatment well    Behavior During Therapy WFL for tasks assessed/performed;Impulsive               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   Cerebellar ataxia ([place])    Dysesthesia 06/04/2016   Essential hypertension 11/05/2019   Foot pain, left 06/04/2016   Gait disturbance 03/16/2019   History of cervical spinal surgery 06/04/2016   History of hiatal hernia    History of kidney stones    Hypertension    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   Memory loss 03/16/2019   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   Pain due to onychomycosis of toenails of both feet 04/28/2019   Pain in left ankle and joints of left foot 08/06/2018   Slurred speech 03/16/2019   Superficial peroneal nerve neuropathy, 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   TIA (transient ischemic attack) 11/05/2019   Worsened handwriting 03/16/2019   Past Surgical History:  Procedure Laterality Date   [place] N/A 09/13/2020   Procedure: CARDIOVERSION;  Surgeon: Sanda Klein, MD;  Location: [facility];  Service: Cardiovascular;  Laterality: N/A;   FRACTURE SURGERY     Left ankle, sx x4   INGUINAL HERNIA REPAIR Right 03/05/2019   Procedure: OPEN RIGHT INGUINAL HERNIA REPAIR WITH MESH;  Surgeon: Clovis Riley, MD;  Location: [facility];  Service: General;  Laterality: Right;    [place] N/A 07/27/2020   Procedure: LEFT ATRIAL APPENDAGE OCCLUSION;  Surgeon: Sherren Mocha, MD;  Location: [facility];  Service: Cardiovascular;  Laterality: N/A;   SHOULDER SURGERY Left    [place] WITHOUT CARDIOVERSION N/A 07/27/2020   Procedure: TRANSESOPHAGEAL ECHOCARDIOGRAM (TEE);  Surgeon: Sherren Mocha, MD;  Location: [facility];  Service: Cardiovascular;  Laterality: N/A;   [place] WITHOUT CARDIOVERSION N/A 09/13/2020   Procedure: TRANSESOPHAGEAL ECHOCARDIOGRAM (TEE);  Surgeon: Sanda Klein, MD;  Location: [facility];  Service: Cardiovascular;  Laterality: N/A;   TONSILLECTOMY     1945   Patient Active Problem List   Diagnosis Date Noted   Pain in right shoulder 07/31/2021   Persistent atrial fibrillation ([place]) 08/28/2020   Secondary hypercoagulable state ([place]) 07/20/2020   Paroxysmal atrial fibrillation ([place]) 06/19/2020   TIA (transient ischemic attack) 11/05/2019   Essential hypertension 11/05/2019   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/05/2019   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   C6 radiculopathy 05/04/2019   Pain due to onychomycosis of toenails of both feet 04/28/2019   Worsened handwriting 03/16/2019   Gait disturbance 03/16/2019   Slurred speech 03/16/2019   Memory loss 03/16/2019   Pain in left ankle and joints of left foot 08/06/2018   Mild cognitive  impairment 12/12/2017   Foot pain, left 06/04/2016   Dysesthesia 06/04/2016   Superficial peroneal nerve neuropathy, 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   History of cervical spinal surgery 06/04/2016    ONSET DATE: 02/06/2022   REFERRING DIAG: R26.9 (ICD-10-CM) - Gait disturbance G11.9 (ICD-10-CM) - Cerebellar ataxia (HCC)   THERAPY DIAG:  Unsteadiness on feet  Other lack of coordination  Muscle weakness (generalized)  Ataxic gait  History of falling  Rationale for Evaluation and Treatment Rehabilitation  SUBJECTIVE:                                                                                                                                                                                               SUBJECTIVE STATEMENT: Pt reports he is doing "great" today. No acute changes, no falls, no pain. Pt reports he has been working on his HEP and that it remains challenging.  Pt accompanied by: self   PERTINENT HISTORY:  C6 radiculopathy, Cerebellar ataxia ([place]), Dysesthesia, Essential hypertension, Foot pain (left), Gait disturbance, History of cervical spinal surgery (06/04/2016), History of hiatal hernia, History of kidney stones, 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, Memory loss, Mild cognitive impairment, Pain due to onychomycosis of toenails of both feet, Pain in left ankle and joints of left foot, Slurred speech, Superficial peroneal nerve neuropathy, left (06/04/2009),TIA (transient ischemic attack) 11/05/2019. NCV/EMG study did not show any evidence of motor neuron disease. We did the [employer] SCA panel (30+ genes). It was borderline. 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 No  PRECAUTIONS: Fall  FALLS: Has patient fallen in last 6 months? Yes. Number of falls around 3, mostly inside his home all in retro direction  PLOF: Independent with basic ADLs and Requires assistive device for independence  PATIENT GOALS "balance"  OBJECTIVE:   DIAGNOSTIC FINDINGS: NCV/EMG study 05/04/2019 showed the following: 1.   Chronic right C6 radiculopathy.  There could also be milder C5 and C7 chronic radiculopathies. 2.   Chronic left S1 radiculopathy. 3.   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   There was no evidence of polyneuropathy, motor neuron disease or myopathy.  COORDINATION: Heel to shin test: Dysmetria  bilaterally    POSTURE: rounded shoulders, forward head, increased thoracic kyphosis, and posterior pelvic tilt   TODAY'S TREATMENT:   Ther Ex   SciFit multi-peaks level 7.5 for 8 minutes using BLEs for  neural priming for reciprocal movement, dynamic cardiovascular warmup and increased amplitude of stepping. RPE of 4/10 following exercise.   THER ACT:  In // bars: Stepping over 4" hurdles initially with BUE support progressing to no UE support Noted to have decreased eccentric control of LLE when stepping, able to improve with v/c Stepping over 4" hurdles with added 4# ankle weights with intermittent UE support needed Improved control of BLE noted when stepping Decreased static standing balance following stepping, needs increased time and use of ankle strategy and occasional reaching out with UE for support to regain balance Forward/retro gait with no UE support with CGA Initially with narrow BOS, path deviation to the L, and decreased LE clearance with retro gait; progresses to increased step length with retro gait Resisted forward/retro gait with BUE support and CGA with red theraband around ankles Focus on widening BOS and increasing step length with retro gait Pt requires UE support with retro gait especially to continue to exhibit improved gait mechanics    PATIENT EDUCATION: Education details: continue HEP Person educated: Patient Education method: Customer service manager Education comprehension: verbalized understanding   HOME EXERCISE PROGRAM: Access Code: [email or web address] URL: [email or web address] Date: 02/11/2022 Prepared by: Mickie Bail Plaster  Exercises (using red theraband)  - Sit to Stand Without Arm Support  - 1 x daily - 7 x weekly - 3 sets - 10 reps - Step sideways with arms reaching at counter   - 1 x daily - 7 x weekly - 3 sets - 10 reps - Standing Balance Activity: Plyometric Modified Lower Extremity Jumping Jack  - 1 x daily - 7 x weekly - 3 sets -  10 reps - [employer] on Counter  - 1 x daily - 7 x weekly - 3 sets - 10 reps - Side Stepping with Resistance at [employer] and Counter Support  - 1 x daily - 7 x weekly - 3 sets - 10 reps - Forward Backward Monster Walk with Band at [employer] and Counter Support  - 1 x daily - 7 x weekly - 3 sets - 10 reps    GOALS: Goals reviewed with patient? Yes  SHORT TERM GOALS: Target date: 03/01/2022  Pt will be independent with initial HEP for improved strength, balance, transfers and gait.  Baseline: Not established on eval  Goal status: MET  2.  Pt will improve 5 x STS to less than or equal to 15 seconds without bracing against chair or >2 instances of retropulsion to demonstrate improved functional strength and transfer efficiency.   Baseline: 14.6s without UE support, significant retropulsion and bracing against chair w/BLEs; 20.69s without UE support  Goal status: IN PROGRESS  3.  Pt will improve Berg score to 41/56 for decreased fall risk  Baseline: 37/56; 48/56  Goal status: MET  4.  Pt will perform floor transfer and LTG updated as appropriate  Baseline:  Goal status: MET   LONG TERM GOALS: Target date: 03/29/2022 (updated to match cert date)  Pt will be independent with final HEP for improved strength, balance, transfers and gait.  Baseline:  Goal status: INITIAL  2.  Pt will improve Berg score to 45/56 for decreased fall risk  Baseline: 37/56; 48/56  Goal status: MET  3.  Pt will improve normal TUG to less than or equal to 14 seconds w/LRAD for improved functional mobility and decreased fall risk.  Baseline: 18.31s w/rollator; 18.37s w/rollator; 21.06s with rollator; 19.47s w/rollator   Goal status: IN PROGRESS  4.  Floor transfer  goal  Baseline: assessed with previous PT (ModI/supervision)  Goal status: goal not needed  5.  Pt will improve gait velocity to at least 2.9 ft/s wit LRAD for improved gait efficiency  Baseline: 2.45 ft/s w/LRAD; 2.09 ft/s on 11/1;  2.15f/s; 2.5 ft/s w/rollator  Goal status: IN PROGRESS  6. Pt will improve 5 x STS to less than or equal to 15 seconds without UE support to demonstrate improved functional strength and transfer efficiency.  Baseline: 20.69s without UE support on 10/11; 17.15s without UE support on 11/1; 14.31s no UE  Goal Status: MET  NEW LONG TERM GOALS: Target date: 04/22/22 1. Pt will be independent with final HEP for improved strength, balance, transfers and gait.  Baseline:  Goal status: INITIAL  2.  Pt will improve Berg score to 52/56 for decreased fall risk  Baseline: 37/56; 48/56  Goal status: INITIAL  3.  Pt will improve normal TUG to less than or equal to 14 seconds w/LRAD for improved functional mobility and decreased fall risk.  Baseline: 18.31s w/rollator; 18.37s w/rollator  Goal status: IN PROGRESS   4.  Pt will improve gait velocity to at least 2.9 ft/s wit LRAD for improved gait efficiency  Baseline: 2.45 ft/s w/LRAD; 2.09 ft/s on 11/1  Goal status: IN PROGRESS  5. Pt will improve 5 x STS to less than or equal to 15 seconds without UE support to demonstrate improved functional strength and transfer efficiency.  Baseline: 20.69s without UE support on 10/11; 17.15s without UE support on 11/1  Goal Status: IN PROGRESS    ASSESSMENT:  CLINICAL IMPRESSION: Emphasis of skilled PT session on continuing to work on BPerthand improved control with retro gait and obstacle/hurdle navigation. Pt exhibits improved gait mechanics with v/c for corrections including widening his BOS, increasing his step length and LE clearance, and increasing his eccentric control when stepping. Pt exhibits greatest challenge to his standing balance when stepping over hurdles with no UE support with use of 4# ankle weights. Pt continues to benefit from skilled therapy services to address ongoing ataxia leading to balance and gait impairments. Continue POC.   OBJECTIVE IMPAIRMENTS Abnormal gait, decreased  balance, decreased coordination, decreased mobility, difficulty walking, and decreased safety awareness.   ACTIVITY LIMITATIONS lifting, standing, stairs, transfers, reach over head, and locomotion level  PARTICIPATION LIMITATIONS: cleaning, laundry, driving, shopping, community activity, and yard work  [place], [place], Time since onset of injury/illness/exacerbation, and 1-2 comorbidities: cerebellar ataxia and history of TIA  are also affecting patient's functional outcome.   REHAB POTENTIAL: Good  CLINICAL DECISION MAKING: Evolving/moderate complexity  EVALUATION COMPLEXITY: Moderate  PLAN: PT FREQUENCY: 2x/week  PT DURATION: 6 weeks  PLANNED INTERVENTIONS: Therapeutic exercises, Therapeutic activity, Neuromuscular re-education, Balance training, Gait training, Patient/Family education, Self Care, Stair training, Vestibular training, Canalith repositioning, DME instructions, Aquatic Therapy, Manual therapy, and Re-evaluation  PLAN FOR NEXT SESSION: Retro gait, Eccentric heel taps, Plyometrics, safety w/gait and transfers (he uses SPC at home), continue to practice w/ weighted vest (I used 15#), LE coordination, floor transfer, gait with theraband, tall-kneeling? Rockerboard and unlevel surfaces, turns, update HEP, resisted gait with bungee (sidesteps, retro)    TExcell Seltzer PT, DPT, CSpecialty Surgical [facility]SRipleyGDublin Keens[place]  [address]Phone:  [phone]:  [phone]/27/2023, 12:32 PM

## 2022-04-18 ENCOUNTER — Ambulatory Visit: Payer: Medicare Other | Admitting: Physical Therapy

## 2022-04-18 DIAGNOSIS — M6281 Muscle weakness (generalized): Secondary | ICD-10-CM

## 2022-04-18 DIAGNOSIS — R278 Other lack of coordination: Secondary | ICD-10-CM

## 2022-04-18 DIAGNOSIS — R270 Ataxia, unspecified: Secondary | ICD-10-CM | POA: Diagnosis not present

## 2022-04-18 DIAGNOSIS — Z9181 History of falling: Secondary | ICD-10-CM | POA: Diagnosis not present

## 2022-04-18 DIAGNOSIS — R2681 Unsteadiness on feet: Secondary | ICD-10-CM

## 2022-04-18 DIAGNOSIS — R260 Ataxic gait: Secondary | ICD-10-CM | POA: Diagnosis not present

## 2022-04-18 NOTE — Therapy (Signed)
OUTPATIENT PHYSICAL THERAPY NEURO TREATMENT   Patient Name: Derrick White MRN: [id number] DOB:[birth year], 81 y.o., male Today's Date: 04/18/2022   PCP: Haywood Pao, MD REFERRING PROVIDER: Britt Bottom, MD    PT End of Session - 04/18/22 1233     Visit Number 18    Number of Visits 21    Date for PT Re-Evaluation 04/22/22    Authorization Type Medicare    Progress Note Due on Visit 20    PT Start Time 1232    PT Stop Time 1314    PT Time Calculation (min) 42 min    Equipment Utilized During Treatment Gait belt    Activity Tolerance Patient tolerated treatment well    Behavior During Therapy [facility] for tasks assessed/performed;Impulsive                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   Cerebellar ataxia ([place])    Dysesthesia 06/04/2016   Essential hypertension 11/05/2019   Foot pain, left 06/04/2016   Gait disturbance 03/16/2019   History of cervical spinal surgery 06/04/2016   History of hiatal hernia    History of kidney stones    Hypertension    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   Memory loss 03/16/2019   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   Pain due to onychomycosis of toenails of both feet 04/28/2019   Pain in left ankle and joints of left foot 08/06/2018   Slurred speech 03/16/2019   Superficial peroneal nerve neuropathy, 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   TIA (transient ischemic attack) 11/05/2019   Worsened handwriting 03/16/2019   Past Surgical History:  Procedure Laterality Date   [place] N/A 09/13/2020   Procedure: CARDIOVERSION;  Surgeon: Sanda Klein, MD;  Location: [place];  Service: Cardiovascular;  Laterality: N/A;   FRACTURE SURGERY     Left ankle, sx x4   INGUINAL HERNIA REPAIR Right 03/05/2019   Procedure: OPEN RIGHT INGUINAL HERNIA REPAIR WITH MESH;  Surgeon: Clovis Riley, MD;  Location: [place];  Service: General;  Laterality: Right;    [place] N/A 07/27/2020   Procedure: LEFT ATRIAL APPENDAGE OCCLUSION;  Surgeon: Sherren Mocha, MD;  Location: [place] [facility];  Service: Cardiovascular;  Laterality: N/A;   SHOULDER SURGERY Left    TE[place] WITHOUT CARDIOVERSION N/A 07/27/2020   Procedure: TRANSESOPHAGEAL ECHOCARDIOGRAM (TEE);  Surgeon: Sherren Mocha, MD;  Location: [place] [facility];  Service: Cardiovascular;  Laterality: N/A;   TEE WITHOUT CARDIOVERSION N/A 09/13/2020   Procedure: TRANSESOPHAGEAL ECHOCARDIOGRAM (TEE);  Surgeon: Sanda Klein, MD;  Location: [facility];  Service: Cardiovascular;  Laterality: N/A;   TONSILLECTOMY     1945   Patient Active Problem List   Diagnosis Date Noted   Pain in right shoulder 07/31/2021   Persistent atrial fibrillation ([place]) 08/28/2020   Secondary hypercoagulable state ([place]) 07/20/2020   Paroxysmal atrial fibrillation ([place]) 06/19/2020   TIA (transient ischemic attack) 11/05/2019   Essential hypertension 11/05/2019   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   C6 radiculopathy 05/04/2019   Pain due to onychomycosis of toenails of both feet 04/28/2019   Worsened handwriting 03/16/2019   Gait disturbance 03/16/2019   Slurred speech 03/16/2019   Memory loss 03/16/2019   Pain in left ankle and joints of left foot 08/06/2018   Mild  cognitive impairment 12/12/2017   Foot pain, left 06/04/2016   Dysesthesia 06/04/2016   Superficial peroneal nerve neuropathy, 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 No changes to report. States his balance is worse when it is cold. No falls    Pt accompanied by: self   PERTINENT HISTORY:  C6 radiculopathy, Cerebellar ataxia (HCC), Dysesthesia, Essential hypertension, Foot pain (left), Gait disturbance, History of cervical spinal surgery (06/04/2016), History of hiatal hernia, History of kidney stones, 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, Memory loss, Mild cognitive impairment, Pain due to onychomycosis of toenails of both feet, Pain in left ankle and joints of left foot, Slurred speech, Superficial peroneal nerve neuropathy, left (06/04/2009),TIA (transient ischemic attack) 11/05/2019. NCV/EMG study did not show any evidence of motor neuron disease. We did the [employer] SCA panel (30+ genes). It was borderline. 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 No  PRECAUTIONS: Fall  FALLS: Has patient fallen in last 6 months? Yes. Number of falls around 3, mostly inside his home all in retro direction  PLOF: Independent with basic ADLs and Requires assistive device for independence  PATIENT GOALS "balance"  OBJECTIVE:   DIAGNOSTIC FINDINGS: NCV/EMG study 05/04/2019 showed the following: 1.   Chronic right C6 radiculopathy.  There could also be milder C5 and C7 chronic radiculopathies. 2.   Chronic left S1 radiculopathy. 3.   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   There was no evidence of polyneuropathy, motor neuron disease or myopathy.  COORDINATION: Heel to shin test: Dysmetria bilaterally    POSTURE: rounded shoulders, forward head, increased  thoracic kyphosis, and posterior pelvic tilt   TODAY'S TREATMENT:   Ther Ex   SciFit ramp level 8 for 8 minutes using BLEs for neural priming for reciprocal movement, dynamic cardiovascular warmup and increased  amplitude of stepping. RPE of 5/10 following exercise.  NMR   In // bars: On purple balance beam for improved reactive balance strategies, step length/clearance and single leg stability:  Side stepping, x3 down and back. Unable to perform without UE support and frequently relying on posterior lean on bar to correct posterior LOB. Min cues for increased step clearance of LLE > RLE  Side step w/double foot tap to gumdrop, x2 down and back. Pt relying heavily on BUE support throughout  Side step over 4" hurdles, x2 down and back. Pt w/significant difficulty w/proper foot placement throughout, frequently knocking over hurdle w/RLE.  Alt fwd step up/downs to 4" step w/posterior resistance, x10. Pt  required cues to alternate legs, as he only wants to lead w/RLE. Pt with significant eccentric control difficulty and relied heavily on UE support to prevent posterior fall. Min A throughout for assistance performing  Progressed to lateral step up/downs, x8 per side. Noted increased difficulty stepping on L side > R side and continued difficulty without UE support. However, pt able to perform 2-3 reps each side without UE support.     PATIENT EDUCATION: Education details: continue HEP Person educated: Patient Education method: Customer service manager Education comprehension: verbalized understanding   HOME EXERCISE PROGRAM: Access Code: [email or web address] URL: [email or web address] Date: 02/11/2022 Prepared by: Mickie Bail Latwan Luchsinger  Exercises (using red theraband)  - Sit to Stand Without Arm Support  - 1 x daily - 7 x weekly - 3 sets - 10 reps - Step sideways with arms reaching at counter   - 1 x daily - 7 x weekly - 3 sets - 10 reps - Standing Balance Activity: Plyometric Modified  Lower Extremity Jumping Jack  - 1 x daily - 7 x weekly - 3 sets - 10 reps - [employer] on Counter  - 1 x daily - 7 x weekly - 3 sets - 10 reps - Side Stepping with Resistance at [employer] and Counter Support  - 1 x daily - 7 x weekly - 3 sets - 10 reps - Forward Backward Monster Walk with Band at [employer] and Counter Support  - 1 x daily - 7 x weekly - 3 sets - 10 reps    GOALS: Goals reviewed with patient? Yes  SHORT TERM GOALS: Target date: 03/01/2022  Pt will be independent with initial HEP for improved strength, balance, transfers and gait.  Baseline: Not established on eval  Goal status: MET  2.  Pt will improve 5 x STS to less than or equal to 15 seconds without bracing against chair or >2 instances of retropulsion to demonstrate improved functional strength and transfer efficiency.   Baseline: 14.6s without UE support, significant retropulsion and bracing against chair w/BLEs; 20.69s without UE support  Goal status: IN PROGRESS  3.  Pt will improve Berg score to 41/56 for decreased fall risk  Baseline: 37/56; 48/56  Goal status: MET  4.  Pt will perform floor transfer and LTG updated as appropriate  Baseline:  Goal status: MET   LONG TERM GOALS: Target date: 03/29/2022 (updated to match cert date)  Pt will be independent with final HEP for improved strength, balance, transfers and gait.  Baseline:  Goal status: INITIAL  2.  Pt will improve Berg score to 45/56 for decreased fall risk  Baseline: 37/56; 48/56  Goal status: MET  3.  Pt will improve normal TUG to less than or equal to 14 seconds w/LRAD for improved functional mobility and decreased fall risk.  Baseline:  18.31s w/rollator; 18.37s w/rollator; 21.06s with rollator; 19.47s w/rollator   Goal status: IN PROGRESS  4.  Floor transfer goal  Baseline: assessed with previous PT (ModI/supervision)  Goal status: goal not needed  5.  Pt will improve gait velocity to at least 2.9 ft/s wit LRAD for improved  gait efficiency  Baseline: 2.45 ft/s w/LRAD; 2.09 ft/s on 11/1; 2.32f/s; 2.5 ft/s w/rollator  Goal status: IN PROGRESS  6. Pt will improve 5 x STS to less than or equal to 15 seconds without UE support to demonstrate improved functional strength and transfer efficiency.  Baseline: 20.69s without UE support on 10/11; 17.15s without UE support on 11/1; 14.31s no UE  Goal Status: MET  NEW LONG TERM GOALS: Target date: 04/22/22 1. Pt will be independent with final HEP for improved strength, balance, transfers and gait.  Baseline:  Goal status: INITIAL  2.  Pt will improve Berg score to 52/56 for decreased fall risk  Baseline: 37/56; 48/56  Goal status: INITIAL  3.  Pt will improve normal TUG to less than or equal to 14 seconds w/LRAD for improved functional mobility and decreased fall risk.  Baseline: 18.31s w/rollator; 18.37s w/rollator  Goal status: IN PROGRESS   4.  Pt will improve gait velocity to at least 2.9 ft/s wit LRAD for improved gait efficiency  Baseline: 2.45 ft/s w/LRAD; 2.09 ft/s on 11/1  Goal status: IN PROGRESS  5. Pt will improve 5 x STS to less than or equal to 15 seconds without UE support to demonstrate improved functional strength and transfer efficiency.  Baseline: 20.69s without UE support on 10/11; 17.15s without UE support on 11/1  Goal Status: IN PROGRESS    ASSESSMENT:  CLINICAL IMPRESSION: Emphasis of skilled PT session on reactive balance strategies, single leg stability and LE coordination. Pt very challenged by balance beam and relied heavily on UE support to perform tasks despite cues. Pt exhibits impulsive behavior that leads to improper foot placement and LOB. Continue POC.   OBJECTIVE IMPAIRMENTS Abnormal gait, decreased balance, decreased coordination, decreased mobility, difficulty walking, and decreased safety awareness.   ACTIVITY LIMITATIONS lifting, standing, stairs, transfers, reach over head, and locomotion level  PARTICIPATION  LIMITATIONS: cleaning, laundry, driving, shopping, community activity, and yard work  PDavyAge, Fitness, Time since onset of injury/illness/exacerbation, and 1-2 comorbidities: cerebellar ataxia and history of TIA  are also affecting patient's functional outcome.   REHAB POTENTIAL: Good  CLINICAL DECISION MAKING: Evolving/moderate complexity  EVALUATION COMPLEXITY: Moderate  PLAN: PT FREQUENCY: 2x/week  PT DURATION: 6 weeks  PLANNED INTERVENTIONS: Therapeutic exercises, Therapeutic activity, Neuromuscular re-education, Balance training, Gait training, Patient/Family education, Self Care, Stair training, Vestibular training, Canalith repositioning, DME instructions, Aquatic Therapy, Manual therapy, and Re-evaluation  PLAN FOR NEXT SESSION: Retro gait, Eccentric heel taps, Plyometrics, safety w/gait and transfers (he uses SPC at home), continue to practice w/ weighted vest (I used 15#), LE coordination, floor transfer, gait with theraband, tall-kneeling? Rockerboard and unlevel surfaces, turns, update HEP, resisted gait with bungee (sidesteps, retro)    JFrancena Hanly PT, DEast Jordan[address]SLitchfieldGBarnes [place]  [id number]:  [phone]:  [phone]/30/2023, 1:18 PM

## 2022-04-22 ENCOUNTER — Ambulatory Visit: Payer: Medicare Other | Admitting: Physical Therapy

## 2022-04-25 ENCOUNTER — Ambulatory Visit: Payer: Medicare Other | Attending: Neurology | Admitting: Physical Therapy

## 2022-04-25 DIAGNOSIS — M6281 Muscle weakness (generalized): Secondary | ICD-10-CM | POA: Diagnosis not present

## 2022-04-25 DIAGNOSIS — R2681 Unsteadiness on feet: Secondary | ICD-10-CM | POA: Insufficient documentation

## 2022-04-25 DIAGNOSIS — R278 Other lack of coordination: Secondary | ICD-10-CM | POA: Diagnosis not present

## 2022-04-25 NOTE — Therapy (Signed)
OUTPATIENT PHYSICAL THERAPY NEURO TREATMENT- RECERTIFICATION   Patient Name: Derrick White MRN: [id number] DOB:11/29/1940, 81 y.o., male Today's Date: 04/25/2022   PCP: Haywood Pao, MD REFERRING PROVIDER: Britt Bottom, MD    PT End of Session - 04/25/22 1019     Visit Number 19    Number of Visits 21    Date for PT Re-Evaluation 34/91/79   Recert   Authorization Type Medicare    Progress Note Due on Visit 25    PT Start Time 1017    PT Stop Time 1102    PT Time Calculation (min) 45 min    Equipment Utilized During Treatment Gait belt    Activity Tolerance Patient tolerated treatment well    Behavior During Therapy WFL for tasks assessed/performed                 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   Cerebellar ataxia ([place])    Dysesthesia 06/04/2016   Essential hypertension 11/05/2019   Foot pain, left 06/04/2016   Gait disturbance 03/16/2019   History of cervical spinal surgery 06/04/2016   History of hiatal hernia    History of kidney stones    Hypertension    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   Memory loss 03/16/2019   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   Pain due to onychomycosis of toenails of both feet 04/28/2019   Pain in left ankle and joints of left foot 08/06/2018   Slurred speech 03/16/2019   Superficial peroneal nerve neuropathy, 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   TIA (transient ischemic attack) 11/05/2019   Worsened handwriting 03/16/2019   Past Surgical History:  Procedure Laterality Date   [place] N/A 09/13/2020   Procedure: CARDIOVERSION;  Surgeon: Sanda Klein, MD;  Location: [facility];  Service: Cardiovascular;  Laterality: N/A;   FRACTURE SURGERY     Left ankle, sx x4   INGUINAL HERNIA REPAIR Right 03/05/2019   Procedure: OPEN RIGHT INGUINAL HERNIA REPAIR WITH MESH;  Surgeon: Clovis Riley, MD;  Location: [facility];  Service: General;   Laterality: Right;   [place] N/A 07/27/2020   Procedure: LEFT ATRIAL APPENDAGE OCCLUSION;  Surgeon: Sherren Mocha, MD;  Location: [facility];  Service: Cardiovascular;  Laterality: N/A;   SHOULDER SURGERY Left    [place] WITHOUT CARDIOVERSION N/A 07/27/2020   Procedure: TRANSESOPHAGEAL ECHOCARDIOGRAM (TEE);  Surgeon: Sherren Mocha, MD;  Location: [facility];  Service: Cardiovascular;  Laterality: N/A;   TE[place] WITHOUT CARDIOVERSION N/A 09/13/2020   Procedure: TRANSESOPHAGEAL ECHOCARDIOGRAM (TEE);  Surgeon: Sanda Klein, MD;  Location: [facility];  Service: Cardiovascular;  Laterality: N/A;   TONSILLECTOMY     1945   Patient Active Problem List   Diagnosis Date Noted   Pain in right shoulder 07/31/2021   Persistent atrial fibrillation ([place]) 08/28/2020   Secondary hypercoagulable state ([place]) 07/20/2020   Paroxysmal atrial fibrillation ([place]) 06/19/2020   TIA (transient ischemic attack) 11/05/2019   Essential hypertension 11/05/2019   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/05/2019   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   C6 radiculopathy 05/04/2019   Pain due to onychomycosis of toenails of both feet 04/28/2019   Worsened handwriting 03/16/2019   Gait disturbance 03/16/2019   Slurred speech 03/16/2019   Memory loss 03/16/2019   Pain in left ankle and joints of left foot  08/06/2018   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   Foot pain, left 06/04/2016   Dysesthesia 06/04/2016   Superficial peroneal nerve neuropathy, 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 NCV/EMG study did not show any evidence of motor neuron disease. We did the Athena SCA panel (30+ genes). It was borderline. 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 Unclear if this is playing a role (SCA type 6 has mutations in this [place] but usually triplet repeat)   PAIN:  Are you having pain? No  PRECAUTIONS: Fall  FALLS: Has patient fallen in last 6 months? Yes. Number of falls around 3, mostly inside his home all in retro direction  PLOF: Independent with basic ADLs and Requires assistive device for independence  PATIENT GOALS "balance"  OBJECTIVE:   DIAGNOSTIC FINDINGS: NCV/EMG study 05/04/2019 showed the following: 1.   Chronic right C6 radiculopathy.  There could also be milder C5 and C7 chronic radiculopathies. 2.   Chronic left S1 radiculopathy. 3.   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   There was no evidence of polyneuropathy, motor neuron disease or myopathy.  COORDINATION: Heel to shin test: Dysmetria bilaterally    POSTURE: rounded shoulders, forward head, increased thoracic kyphosis, and posterior pelvic tilt   TODAY'S TREATMENT:  Ther Act    LTG Assessment   [facility] PT Assessment - 04/25/22 1024       Transfers   Five time sit to stand comments  16.22s without UE support  Ambulation/Gait   Gait velocity 32.8' over 14.65s = 2.23 ft/s w/rollator      Berg Balance Test   Sit to Stand Able to stand without using hands and stabilize independently    Standing Unsupported Able to stand safely 2 minutes    Sitting with Back Unsupported but Feet Supported on Floor or Stool Able to sit safely and securely 2 minutes    Stand to Sit Sits safely with minimal use of hands    Transfers Able to transfer safely, minor use of hands    Standing Unsupported with Eyes Closed Able to stand 10 seconds safely    Standing Unsupported with Feet Together Able to place feet together independently and stand 1 minute safely    From Standing, Reach Forward with Outstretched Arm Can reach confidently >25 cm (10")    From Standing Position, Pick up Object from Floor Able to pick up shoe safely and easily    From Standing Position, Turn to Look Behind Over each Shoulder Looks behind from both sides and weight shifts well    Turn 360 Degrees Able to turn 360 degrees safely but slowly   8.81s to L, 6.56s to R   Standing Unsupported, Alternately Place Feet on Step/Stool Able to stand independently and safely and complete 8 steps in 20 seconds    Standing Unsupported, One Foot in Front Able to place foot tandem independently and hold 30 seconds    Standing on One Leg Tries to lift leg/unable to hold 3 seconds but remains standing independently    Total Score 51    Berg comment: Moderate fall risk      Timed Up and Go Test   Normal TUG (seconds) 17.47   w/rollator           Ther Ex  SciFit multi-peaks level 13 for 8 minutes using BUE/BLEs for neural priming for reciprocal movement, dynamic cardiovascular conditioning and global strength. RPE of 7/10 following activity     PATIENT EDUCATION: Education details: continue HEP, plan to DC next week   Person educated: Patient Education method: Customer service manager Education comprehension: verbalized understanding   HOME EXERCISE PROGRAM: Access Code: [email or web address] URL: [email or web address] Date: 02/11/2022 Prepared by: Mickie Bail Gracelynne Benedict  Exercises (using red theraband)  - Sit to Stand Without Arm Support  - 1 x daily - 7 x weekly - 3 sets - 10 reps - Step sideways with arms reaching at counter   - 1 x daily - 7 x weekly - 3 sets - 10 reps - Standing Balance Activity: Plyometric Modified Lower Extremity Jumping Jack  - 1 x daily - 7 x weekly - 3 sets - 10 reps - [employer] on Counter  - 1 x daily - 7 x weekly - 3 sets - 10 reps - Side Stepping with Resistance at [employer] and Counter Support  - 1 x daily - 7 x weekly - 3 sets - 10 reps - Forward Backward Monster Walk with Band at [employer] and Counter Support  - 1 x daily - 7 x weekly - 3 sets - 10 reps    GOALS: Goals reviewed with patient? Yes   NEW LONG TERM GOALS: Target date: 05/02/22 (Updated to match cert date) 1. Pt will be independent with final HEP for improved strength, balance, transfers and gait.  Baseline:  Goal status: IN PROGRESS   2.  Pt will improve Berg score to 52/56 for decreased fall risk  Baseline: 37/56; 48/56; 51/56 Goal status: PARTIALLY MET  3.  Pt will improve normal TUG to less than or equal to 14 seconds w/LRAD for improved functional mobility and decreased fall risk.  Baseline: 18.31s w/rollator; 18.37s w/rollator; 17.47s w/rollator on 12/7  Goal status: IN PROGRESS   4.  Pt will improve gait velocity to at least 2.9 ft/s wit LRAD for improved gait efficiency  Baseline: 2.45 ft/s w/LRAD; 2.09 ft/s on 11/1; 2.39f/s on 12/7  Goal status: IN PROGRESS  5. Pt will improve 5 x STS to less than or equal to 15 seconds without UE support to demonstrate improved functional strength and transfer efficiency.  Baseline: 20.69s without UE support on 10/11; 17.15s without UE support  on 11/1; 16.22s without UE support on 12/7   Goal Status: IN PROGRESS    ASSESSMENT:  CLINICAL IMPRESSION: Emphasis of skilled PT session on starting LTG assessment and endurance. Pt has partially met goal 2 of 5, improving his Berg score to 51/56, indicative of moderate fall risk (improved from 38 on eval). Pt continues to have most difficulty w/single leg stance and turns but has significantly improved in his stability and ability to perform compared to eval. Pt has improved his gait speed and TUG time, just not quite to goal level yet. Pt in agreement to DC next week due to improvement in stability and strength. Recert to be sent today to cover final appointments next week, as primary therapist sick last week and had to cancel appointments and pt would like to make up appointment time.   OBJECTIVE IMPAIRMENTS Abnormal gait, decreased balance, decreased coordination, decreased mobility, difficulty walking, and decreased safety awareness.   ACTIVITY LIMITATIONS lifting, standing, stairs, transfers, reach over head, and locomotion level  PARTICIPATION LIMITATIONS: cleaning, laundry, driving, shopping, community activity, and yard work  [place], Time since onset of injury/illness/exacerbation, and 1-2 comorbidities: cerebellar ataxia and history of TIA  are also affecting patient's functional outcome.   REHAB POTENTIAL: Good  CLINICAL DECISION MAKING: Evolving/moderate complexity  EVALUATION COMPLEXITY: Moderate  PLAN: PT FREQUENCY: 2x/week  PT DURATION: 6 weeks + 2 weeks (recert)   PLANNED INTERVENTIONS: Therapeutic exercises, Therapeutic activity, Neuromuscular re-education, Balance training, Gait training, Patient/Family education, Self Care, Stair training, Vestibular training, Canalith repositioning, DME instructions, Aquatic Therapy, Manual therapy, and Re-evaluation  PLAN FOR NEXT SESSION: Retro gait, Eccentric heel taps, Plyometrics, safety w/gait and  transfers (he uses SPC at home), continue to practice w/ weighted vest (I used 15#), LE coordination, floor transfer, gait with theraband, tall-kneeling? Rockerboard and unlevel surfaces, turns, update HEP, resisted gait with bungee (sidesteps, retro)    JFrancena Hanly PT, DLos Lunas9[address]50 Country Club CourtSGarden CityGParamount Lacy-Lakeview  [id number]:  [phone]:  [phone]/11/2021, 11:05 AM

## 2022-04-30 ENCOUNTER — Ambulatory Visit: Payer: Medicare Other | Admitting: Physical Therapy

## 2022-04-30 DIAGNOSIS — M6281 Muscle weakness (generalized): Secondary | ICD-10-CM | POA: Diagnosis not present

## 2022-04-30 DIAGNOSIS — R2681 Unsteadiness on feet: Secondary | ICD-10-CM | POA: Diagnosis not present

## 2022-04-30 DIAGNOSIS — R278 Other lack of coordination: Secondary | ICD-10-CM

## 2022-04-30 NOTE — Therapy (Signed)
OUTPATIENT PHYSICAL THERAPY NEURO TREATMENT- 20TH VISIT PROGRESS NOTE   Patient Name: Derrick White MRN: [id number] DOB:[birth year], 81 y.o., male Today's Date: 04/30/2022   PCP: Haywood Pao, MD REFERRING PROVIDER: Britt Bottom, MD   Physical Therapy Progress Note   Dates of Reporting Period:02/08/22 - 04/30/22  See Note below for Objective Data and Assessment of Progress/Goals.  Thank you for the referral of this patient. Mickie Bail Wafaa Deemer, PT, DPT    PT End of Session - 04/30/22 1105     Visit Number 20    Number of Visits 21    Date for PT Re-Evaluation 25/05/39   Recert   Authorization Type Medicare    Progress Note Due on Visit 20    PT Start Time 1103    PT Stop Time 1143    PT Time Calculation (min) 40 min    Equipment Utilized During Treatment Gait belt    Activity Tolerance Patient tolerated treatment well    Behavior During Therapy WFL for tasks assessed/performed                  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   Cerebellar ataxia ([place])    Dysesthesia 06/04/2016   Essential hypertension 11/05/2019   Foot pain, left 06/04/2016   Gait disturbance 03/16/2019   History of cervical spinal surgery 06/04/2016   History of hiatal hernia    History of kidney stones    Hypertension    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   Memory loss 03/16/2019   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   Pain due to onychomycosis of toenails of both feet 04/28/2019   Pain in left ankle and joints of left foot 08/06/2018   Slurred speech 03/16/2019   Superficial peroneal nerve neuropathy, 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   TIA (transient ischemic attack) 11/05/2019   Worsened handwriting 03/16/2019   Past Surgical History:  Procedure Laterality Date   [place] N/A 09/13/2020   Procedure: CARDIOVERSION;  Surgeon: Sanda Klein, MD;  Location: [facility];  Service:  Cardiovascular;  Laterality: N/A;   FRACTURE SURGERY     Left ankle, sx x4   INGUINAL HERNIA REPAIR Right 03/05/2019   Procedure: OPEN RIGHT INGUINAL HERNIA REPAIR WITH MESH;  Surgeon: Clovis Riley, MD;  Location: [facility];  Service: General;  Laterality: Right;   [place] N/A 07/27/2020   Procedure: LEFT ATRIAL APPENDAGE OCCLUSION;  Surgeon: Sherren Mocha, MD;  Location: [facility];  Service: Cardiovascular;  Laterality: N/A;   SHOULDER SURGERY Left    TEE WITHOUT CARDIOVERSION N/A 07/27/2020   Procedure: TRANSESOPHAGEAL ECHOCARDIOGRAM (TEE);  Surgeon: Sherren Mocha, MD;  Location: [facility];  Service: Cardiovascular;  Laterality: N/A;   TE[place] WITHOUT CARDIOVERSION N/A 09/13/2020   Procedure: TRANSESOPHAGEAL ECHOCARDIOGRAM (TEE);  Surgeon: Sanda Klein, MD;  [facility];  Service: Cardiovascular;  Laterality: N/A;   TONSILLECTOMY     1945   Patient Active Problem List   Diagnosis Date Noted   Pain in right shoulder 07/31/2021   Persistent atrial fibrillation ([place]) 08/28/2020   Secondary hypercoagulable state ([place]) 07/20/2020   Paroxysmal atrial fibrillation ([place]) 06/19/2020   TIA (transient ischemic attack) 11/05/2019   Essential hypertension 11/05/2019   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   C6 radiculopathy 05/04/2019  Pain due to onychomycosis of toenails of both feet 04/28/2019   Worsened handwriting 03/16/2019   Gait disturbance 03/16/2019   Slurred speech 03/16/2019   Memory loss 03/16/2019   Pain in left ankle and joints of left foot 08/06/2018   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   Foot pain, left 06/04/2016   Dysesthesia 06/04/2016   Superficial peroneal nerve neuropathy, 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 NCV/EMG study did not show any evidence of motor neuron disease. We did the Athena SCA panel (30+ genes). It was borderline. 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 Unclear if this is playing a role (SCA type 6 has mutations in this [place] but usually triplet repeat)   PAIN:  Are you having pain? No  PRECAUTIONS: Fall  FALLS: Has patient fallen in last 6 months? Yes. Number of falls around 3, mostly inside his home all in retro direction  PLOF: Independent with basic ADLs and Requires assistive device for independence  PATIENT GOALS "balance"  OBJECTIVE:   DIAGNOSTIC FINDINGS: NCV/EMG study 05/04/2019 showed the following: 1.   Chronic right C6 radiculopathy.  There could also be milder C5 and C7 chronic radiculopathies. 2.   Chronic left S1 radiculopathy. 3.   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    Possible left  superficial peroneal sensory neuropathy  5.   There was no evidence of polyneuropathy, motor neuron disease or myopathy.  COORDINATION: Heel to shin test: Dysmetria bilaterally    POSTURE: rounded shoulders,  forward head, increased thoracic kyphosis, and posterior pelvic tilt   TODAY'S TREATMENT:  Ther Ex  SciFit multi-peaks level 13 for 8 minutes using BLEs only for improved amplitude of stepping, dynamic cardiovascular conditioning and global strength. RPE of 5/10 following activity  NMR  Ambulating fwd/retro up and down ramp, x5 each direction for improved step length, retro stepping and reactive balance strategies. CGA throughout but no major LOB noted. Min cues for increased step length when retro stepping down grade of ramp, but noted adequate step length w/retro stepping up grade of ramp. Added blue mat for 5 reps for added balance challenge and pt had single LOB episode requiring min A due to catching R foot on mat On trampoline w/BUE support for high amplitude movement, BLE strength and LE coordination:  Jumping in place x10. Pt able to clear feet from trampoline each time  Jumping fwd/retro, x10. Increased difficulty w/retro jump but pt able to jump >6" in fwd direction  Jumping in/out, x10. Increased difficulty moving feet in.  Modified mountain climbers, x5. Pt had greatest difficulty with this type of jump due to the dual task and poor foot clearance in retro direction.  Blaze pods on random reach setting using 6 pods, 3x1 min, for improved reactive time, LE coordination and single leg stability. CGA-min A throughout:  Round 1: 8 hits (arranged randomly) Round 2: 8 hits (arranged in semi-circle)  Round 3: 8 hits (arranged in semi-circle)     PATIENT EDUCATION: Education details: continue HEP, plan to DC next session  Person educated: Patient Education method: Explanation and Demonstration Education comprehension: verbalized understanding   HOME EXERCISE PROGRAM: Access  Code: [email or web address] URL: [email or web address] Date: 02/11/2022 Prepared by: Mickie Bail Maurita Havener  Exercises (using red theraband)  - Sit to Stand Without Arm Support  - 1 x daily - 7 x weekly - 3 sets - 10 reps - Step sideways with arms reaching at counter   - 1 x daily - 7 x weekly - 3 sets - 10 reps - Standing Balance Activity: Plyometric Modified Lower Extremity Jumping Jack  - 1 x daily - 7 x weekly - 3 sets - 10 reps - [employer] on Counter  - 1 x daily - 7 x weekly - 3 sets - 10 reps - Side Stepping with Resistance at [employer] and Counter Support  - 1 x daily - 7 x weekly - 3 sets - 10 reps - Forward Backward Monster Walk with Band at [employer] and Counter Support  - 1 x daily - 7 x weekly - 3 sets - 10 reps    GOALS: Goals reviewed with patient? Yes   NEW LONG TERM GOALS: Target date: 05/02/22 (Updated to match cert date) 1. Pt will be independent with final HEP for improved strength, balance, transfers and gait.  Baseline:  Goal status: IN PROGRESS   2.  Pt will improve Berg score to 52/56 for decreased fall risk  Baseline: 37/56; 48/56; 51/56 Goal status: PARTIALLY MET  3.  Pt will improve normal TUG to less than or equal to 14 seconds w/LRAD for improved functional mobility and decreased fall risk.  Baseline: 18.31s w/rollator; 18.37s w/rollator; 17.47s w/rollator on 12/7  Goal status: IN PROGRESS   4.  Pt will improve gait velocity to at least 2.9 ft/s wit LRAD for improved gait efficiency  Baseline: 2.45 ft/s w/LRAD; 2.09 ft/s on 11/1; 2.86f/s on 12/7  Goal status: IN PROGRESS  5. Pt will improve 5 x STS to less than or  equal to 15 seconds without UE support to demonstrate improved functional strength and transfer efficiency.  Baseline: 20.69s without UE support on 10/11; 17.15s without UE support on 11/1; 16.22s without UE support on 12/7   Goal Status: IN PROGRESS    ASSESSMENT:  CLINICAL IMPRESSION: Emphasis of skilled PT session on retro  stepping, reactive balance strategies and LE coordination. Pt improved his step clearance w/retro stepping on grade and on trampoline today compared to previous sessions, but still has greatest difficulty in retro direction. Pt on track and in agreement to DC from PT next session. Continue POC.   OBJECTIVE IMPAIRMENTS Abnormal gait, decreased balance, decreased coordination, decreased mobility, difficulty walking, and decreased safety awareness.   ACTIVITY LIMITATIONS lifting, standing, stairs, transfers, reach over head, and locomotion level  PARTICIPATION LIMITATIONS: cleaning, laundry, driving, shopping, community activity, and yard work  [place] Age, Fitness, Time since onset of injury/illness/exacerbation, and 1-2 comorbidities: cerebellar ataxia and history of TIA  are also affecting patient's functional outcome.   REHAB POTENTIAL: Good  CLINICAL DECISION MAKING: Evolving/moderate complexity  EVALUATION COMPLEXITY: Moderate  PLAN: PT FREQUENCY: 2x/week  PT DURATION: 6 weeks + 2 weeks (recert)   PLANNED INTERVENTIONS: Therapeutic exercises, Therapeutic activity, Neuromuscular re-education, Balance training, Gait training, Patient/Family education, Self Care, Stair training, Vestibular training, Canalith repositioning, DME instructions, Aquatic Therapy, Manual therapy, and Re-evaluation  PLAN FOR NEXT SESSION: Assess goals and DC. Blaze pods Retro gait, Eccentric heel taps, Plyometrics, safety w/gait and transfers (he uses SPC at home), continue to practice w/ weighted vest (I used 15#), LE coordination, floor transfer, gait with theraband, tall-kneeling? Rockerboard and unlevel surfaces, turns, update HEP, resisted gait with bungee (sidesteps, retro)    Francena Hanly, PT, [place] [address] [place], [place]  [address] Phone:  [phone] Fax:  [phone] 04/30/2022, 11:44 AM

## 2022-05-01 DIAGNOSIS — L720 Epidermal cyst: Secondary | ICD-10-CM | POA: Diagnosis not present

## 2022-05-01 DIAGNOSIS — D485 Neoplasm of uncertain behavior of skin: Secondary | ICD-10-CM | POA: Diagnosis not present

## 2022-05-01 DIAGNOSIS — Z85828 Personal history of other malignant neoplasm of skin: Secondary | ICD-10-CM | POA: Diagnosis not present

## 2022-05-01 DIAGNOSIS — L570 Actinic keratosis: Secondary | ICD-10-CM | POA: Diagnosis not present

## 2022-05-02 ENCOUNTER — Ambulatory Visit: Payer: Medicare Other | Admitting: Physical Therapy

## 2022-05-02 DIAGNOSIS — R278 Other lack of coordination: Secondary | ICD-10-CM | POA: Diagnosis not present

## 2022-05-02 DIAGNOSIS — R2681 Unsteadiness on feet: Secondary | ICD-10-CM

## 2022-05-02 DIAGNOSIS — M6281 Muscle weakness (generalized): Secondary | ICD-10-CM

## 2022-05-02 NOTE — Therapy (Signed)
OUTPATIENT PHYSICAL THERAPY NEURO TREATMENT- DISCHARGE SUMMARY   Patient Name: Derrick White MRN: [id number] DOB:1940/07/08, 81 y.o., male Today's Date: 05/02/2022   PCP: Haywood Pao, MD REFERRING PROVIDER: Britt Bottom, MD   PHYSICAL THERAPY DISCHARGE SUMMARY  Visits from Start of Care: 21  Current functional level related to goals / functional outcomes: Mod I w/mobility w/rollator   Remaining deficits: Decreased coordination, impaired balance, ataxic gait      Education / Equipment: HEP   Patient agrees to discharge. Patient goals were partially met. Patient is being discharged due to being pleased with the current functional level.     PT End of Session - 05/02/22 1023     Visit Number 21    Number of Visits 21    Date for PT Re-Evaluation 47/15/95   Recert   Authorization Type Medicare    Progress Note Due on Visit 20    PT Start Time 1022   Pt arrived late   PT Stop Time 37   DC   PT Time Calculation (min) 15 min    Equipment Utilized During Treatment Gait belt    Activity Tolerance Patient tolerated treatment well    Behavior During Therapy WFL for tasks assessed/performed                  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   Cerebellar ataxia ([place])    Dysesthesia 06/04/2016   Essential hypertension 11/05/2019   Foot pain, left 06/04/2016   Gait disturbance 03/16/2019   History of cervical spinal surgery 06/04/2016   History of hiatal hernia    History of kidney stones    Hypertension    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   Memory loss 03/16/2019   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   Pain due to onychomycosis of toenails of both feet 04/28/2019   Pain in left ankle and joints of left foot 08/06/2018   Slurred speech 03/16/2019   Superficial peroneal nerve neuropathy, 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   TIA (transient ischemic attack) 11/05/2019   Worsened handwriting 03/16/2019   Past Surgical History:   Procedure Laterality Date   [place] N/A 09/13/2020   Procedure: CARDIOVERSION;  Surgeon: Sanda Klein, MD;  Location: [facility];  Service: Cardiovascular;  Laterality: N/A;   FRACTURE SURGERY     Left ankle, sx x4   INGUINAL HERNIA REPAIR Right 03/05/2019   Procedure: OPEN RIGHT INGUINAL HERNIA REPAIR WITH MESH;  Surgeon: Clovis Riley, MD;  Location: [facility];  Service: General;  Laterality: Right;   [place] N/A 07/27/2020   Procedure: LEFT ATRIAL APPENDAGE OCCLUSION;  Surgeon: Sherren Mocha, MD;  Location: [facility];  Service: Cardiovascular;  Laterality: N/A;   SHOULDER SURGERY Left    [place] WITHOUT CARDIOVERSION N/A 07/27/2020   Procedure: TRANSESOPHAGEAL ECHOCARDIOGRAM (TEE);  Surgeon: Sherren Mocha, MD;  Location: [facility];  Service: Cardiovascular;  Laterality: N/A;   [place] WITHOUT CARDIOVERSION N/A 09/13/2020   Procedure: TRANSESOPHAGEAL ECHOCARDIOGRAM (TEE);  Surgeon: Sanda Klein, MD;  Location[facility];  Service: Cardiovascular;  Laterality: N/A;   TONSILLECTOMY     1945   Patient Active Problem List   Diagnosis Date Noted   Pain in right shoulder 07/31/2021   Persistent atrial fibrillation ([place]) 08/28/2020   Secondary hypercoagulable state ([place]) 07/20/2020  Paroxysmal atrial fibrillation ([place]) 06/19/2020   TIA (transient ischemic attack) 11/05/2019   Essential hypertension 11/05/2019   Cerebellar ataxia ([place]) 11/05/2019   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   C6 radiculopathy 05/04/2019   Pain due to onychomycosis of toenails of both feet 04/28/2019   Worsened handwriting 03/16/2019   Gait disturbance 03/16/2019   Slurred speech 03/16/2019   Memory loss 03/16/2019   Pain in left ankle and joints of left foot 08/06/2018   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   Foot pain, left 06/04/2016   Dysesthesia 06/04/2016    Superficial peroneal nerve neuropathy, 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 No falls or changes.    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 NCV/EMG study did not show any evidence of motor neuron disease. We did the Athena SCA panel (30+ genes). It was borderline. 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 Unclear if this is playing a role (SCA type 6 has mutations in this Mahesh but usually triplet repeat)   PAIN:  Are you having pain? No  PRECAUTIONS: Fall  FALLS: Has patient fallen in last 6 months? Yes. Number of falls around 3, mostly inside his home all in retro direction  PLOF: Independent with basic ADLs and Requires assistive device for independence  PATIENT GOALS "balance"  OBJECTIVE:   DIAGNOSTIC FINDINGS: NCV/EMG study 05/04/2019 showed the following: 1.   Chronic right C6  radiculopathy.  There could also be milder C5 and C7 chronic radiculopathies. 2.   Chronic left S1 radiculopathy. 3.   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   There was no evidence of polyneuropathy, motor neuron disease or myopathy.  COORDINATION: Heel to shin test: Dysmetria bilaterally    POSTURE: rounded shoulders, forward head, increased thoracic kyphosis, and posterior pelvic tilt   TODAY'S TREATMENT:  Ther Act   LTG Assessment    [facility] PT Assessment - 05/02/22 1030       Transfers   Five time sit to stand comments  14.8s without UE support      Ambulation/Gait   Gait velocity 32.8' over 11.88s = 2.55f/s w/rollator      Timed Up and Go Test   Normal TUG (seconds) 16.28   w/rollator              PATIENT EDUCATION: Education details: continue HEP, how to obtain new PT referral if mobility needs change, goal outcomes  Person educated: Patient Education method: ECustomer service managerEducation comprehension: verbalized understanding   HOME EXERCISE PROGRAM: Access Code: [email or web address]: [email or web address] Date: 02/11/2022 Prepared by: JMickie BailPlaster  Exercises (using red theraband)  - Sit to Stand Without Arm Support  - 1 x daily - 7 x weekly - 3 sets - 10 reps - Step sideways with arms reaching at counter   - 1 x daily - 7 x weekly - 3 sets - 10 reps - Standing Balance Activity: Plyometric Modified Lower Extremity Jumping Jack  - 1 x daily - 7 x weekly - 3 sets - 10 reps - MStarbucks Corporationon Counter  - 1 x daily - 7 x weekly - 3 sets - 10 reps - Side Stepping with Resistance at TSun Microsystemsand Counter Support  - 1 x daily - 7 x weekly - 3 sets - 10 reps - Forward Backward Monster Walk with Band at TSun Microsystemsand Counter Support  - 1 x daily - 7 x weekly - 3 sets - 10 reps    GOALS: Goals reviewed with patient? Yes   NEW LONG TERM GOALS: Target date: 05/02/22 (Updated to  match cert date) 1. Pt will be independent with final HEP for improved strength, balance, transfers and gait.  Baseline:  Goal status: MET   2.  Pt will improve Berg score to 52/56 for decreased fall risk  Baseline: 37/56; 48/56; 51/56 Goal status: PARTIALLY MET  3.  Pt will improve normal TUG to less than or equal to 14 seconds w/LRAD for improved functional mobility and decreased fall risk.  Baseline: 18.31s w/rollator; 18.37s w/rollator; 17.47s w/rollator on 12/7; 16.28s w/rollator on 12/14 Goal status: PARTIALLY MET   4.  Pt will improve gait velocity to at least 2.9 ft/s wit LRAD for improved gait efficiency  Baseline: 2.45 ft/s w/LRAD; 2.09 ft/s on 11/1; 2.257fs on 12/7; 2.76 ft/s w/rollator on 12/14  Goal status: PARTIALLY MET   5. Pt will improve 5 x STS to less than or equal to 15 seconds without UE support to demonstrate improved functional strength and transfer efficiency.  Baseline: 20.69s without UE support on 10/11; 17.15s without UE support on 11/1; 16.22s without UE support on 12/7; 14.8s without UE support on 12/14  Goal Status: MET    ASSESSMENT:  CLINICAL IMPRESSION: Emphasis of skilled PT session on LTG assessment and DC from PT. Pt has met 2 of 5 goals and partially met 3 of 5 goals. Pt has improved his gait speed and safety with turns, resulting in improvement in TUG but not quite to goal level. Pt has also significantly improved his static and dynamic standing balance, improving his  Berg to 51/56, missing his goal by 1 point. Pt is independent with his HEP and has improved his 5xSTS significantly without UE support w/good immediate standing balance. Pt in agreement to DC from PT today to maintain his gains made in PT.   OBJECTIVE IMPAIRMENTS Abnormal gait, decreased balance, decreased coordination, decreased mobility, difficulty walking, and decreased safety awareness.   ACTIVITY LIMITATIONS lifting, standing, stairs, transfers, reach over head, and  locomotion level  PARTICIPATION LIMITATIONS: cleaning, laundry, driving, shopping, community activity, and yard work  [place] Age, Fitness, Time since onset of injury/illness/exacerbation, and 1-2 comorbidities: cerebellar ataxia and history of TIA  are also affecting patient's functional outcome.   REHAB POTENTIAL: Good  CLINICAL DECISION MAKING: Evolving/moderate complexity  EVALUATION COMPLEXITY: Moderate  PLAN: PT FREQUENCY: 2x/week  PT DURATION: 6 weeks + 2 weeks (recert)   PLANNED INTERVENTIONS: Therapeutic exercises, Therapeutic activity, Neuromuscular re-education, Balance training, Gait training, Patient/Family education, Self Care, Stair training, Vestibular training, Canalith repositioning, DME instructions, Aquatic Therapy, Manual therapy, and Re-evaluation    Francena Hanly, PT, [place] [address] [place], [place]  [address] Phone:  [phone] Fax:  [phone] 05/02/2022, 10:44 AM

## 2022-06-04 ENCOUNTER — Other Ambulatory Visit: Payer: Self-pay | Admitting: Neurology

## 2022-06-04 ENCOUNTER — Telehealth: Payer: Self-pay | Admitting: Neurology

## 2022-06-04 DIAGNOSIS — G119 Hereditary ataxia, unspecified: Secondary | ICD-10-CM

## 2022-06-04 DIAGNOSIS — R633 Feeding difficulties, unspecified: Secondary | ICD-10-CM

## 2022-06-04 DIAGNOSIS — R1310 Dysphagia, unspecified: Secondary | ICD-10-CM

## 2022-06-04 NOTE — Telephone Encounter (Signed)
Pt wife is calling. Stated pt is having a hard time swallowing and she wants Dr. Felecia Shelling to sent a referral out for him.

## 2022-06-04 NOTE — Telephone Encounter (Signed)
I called and spoke with the patient and his wife, Derrick White. He has noticed gradually worsening swallowing for the past two months. He denies choking on saliva, but complains of difficulties eating. He does not have swallowing difficulties every time he eats, but about every three days he "gets choked pretty good". He states he has problems with food getting stuck in the back of his throat. He is interested in a referral.

## 2022-06-04 NOTE — Telephone Encounter (Signed)
I left a voicemail for the patient (as per DPR). I informed him that a swallow study had been ordered. I provided a number for a return call with any questions.

## 2022-06-05 ENCOUNTER — Other Ambulatory Visit: Payer: Self-pay | Admitting: Neurology

## 2022-06-05 DIAGNOSIS — G119 Hereditary ataxia, unspecified: Secondary | ICD-10-CM

## 2022-06-05 DIAGNOSIS — R1310 Dysphagia, unspecified: Secondary | ICD-10-CM

## 2022-06-14 DIAGNOSIS — Z85828 Personal history of other malignant neoplasm of skin: Secondary | ICD-10-CM | POA: Diagnosis not present

## 2022-06-14 DIAGNOSIS — L570 Actinic keratosis: Secondary | ICD-10-CM | POA: Diagnosis not present

## 2022-06-14 DIAGNOSIS — L111 Transient acantholytic dermatosis [Grover]: Secondary | ICD-10-CM | POA: Diagnosis not present

## 2022-06-17 ENCOUNTER — Ambulatory Visit (HOSPITAL_COMMUNITY)
Admission: RE | Admit: 2022-06-17 | Discharge: 2022-06-17 | Disposition: A | Discharge status: Home / Self Care | Payer: Medicare Other | Source: Ambulatory Visit | Attending: Internal Medicine | Admitting: Internal Medicine

## 2022-06-17 DIAGNOSIS — R1310 Dysphagia, unspecified: Secondary | ICD-10-CM

## 2022-06-17 DIAGNOSIS — R633 Feeding difficulties, unspecified: Secondary | ICD-10-CM

## 2022-06-17 DIAGNOSIS — G119 Hereditary ataxia, unspecified: Secondary | ICD-10-CM

## 2022-06-17 DIAGNOSIS — R059 Cough, unspecified: Secondary | ICD-10-CM | POA: Insufficient documentation

## 2022-06-17 DIAGNOSIS — Z8673 Personal history of transient ischemic attack (TIA), and cerebral infarction without residual deficits: Secondary | ICD-10-CM | POA: Insufficient documentation

## 2022-06-20 ENCOUNTER — Encounter: Payer: Self-pay | Admitting: Neurology

## 2022-06-20 ENCOUNTER — Ambulatory Visit (INDEPENDENT_AMBULATORY_CARE_PROVIDER_SITE_OTHER): Payer: Medicare Other | Admitting: Neurology

## 2022-06-20 VITALS — BP 137/71 | HR 64 | Ht 70.0 in | Wt 183.6 lb

## 2022-06-20 DIAGNOSIS — R1310 Dysphagia, unspecified: Secondary | ICD-10-CM | POA: Diagnosis not present

## 2022-06-20 DIAGNOSIS — R269 Unspecified abnormalities of gait and mobility: Secondary | ICD-10-CM

## 2022-06-20 DIAGNOSIS — R4781 Slurred speech: Secondary | ICD-10-CM | POA: Diagnosis not present

## 2022-06-20 DIAGNOSIS — G119 Hereditary ataxia, unspecified: Secondary | ICD-10-CM

## 2022-06-20 NOTE — Progress Notes (Signed)
[facility]  PATIENT: Derrick White DOB: Apr 28, 1941  REFERRING DOCTOR OR PCP:  Domenick Gong SOURCE: patient, records in EPIC from [facility], MRI results, EMG/NCV results  _________________________________   HISTORICAL  CHIEF COMPLAINT:  Chief Complaint  Patient presents with   Follow-up    RM 11, alone. Last seen 02/06/22. Ambulates with walker. Has had a lot of near falls.  No new concerns.     HISTORY OF PRESENT ILLNESS:  Derrick White is a 82 yo man with left ankle and foot pain.    Update  06/20/2022 Symptoms are mostly stable since the last visit.  He uses a rolling walker more than a cane.  He continues to have difficulty with slurred speech and swallowing.  His family is still a walk in tub with grab bars.   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  He has had a swallow study for dysphagia.  Unchanged compared to 2021.  He was instructed on food swallow    His children age (74-60 age) and 5 grandkids and show no symptoms.  He was +/- 82 at onset.   We did the Athena SCA panel (30+ genes).   It was borderline.  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    Unclear if this is playing a role (SCA type 6 has mutations in this Shion but usually triplet repeat)  Although legs are much worse, he also has ataxia in the hands that affect handwriting and utensils.  NCV/EMG study did not show any evidence of motor neuron disease.  One facial muscle had smaller than typical motor units but recruitment was normal and 2 other facial muscles appear normal.  He does not have any difficulties with diplopia or ptosis.  MRI did not show any source of these bulbar symptoms.  The extent of atrophy in the cerebellum was minimal.  Only mild ETOH use in past.  He has not been exposed to Dilantin or antineoplastic agents.     History:   He broke his left ankle in 2012 or 2013 requiring  plate and screws.   He later had the hardware removed without benefit.    A year leater, he had surgery to decompress the peroneal nerve.    He was referred to [facility] and had the left peroneal neurectomy and removal of  L3L4 Morton's Neuroma and L2L3 intermetatarsal nerve decompression procedures.    He was tried on Lyrica but felt very off balanced and mentally slowed so he stopped.  He had mild benefit from lidocaine containing ointments. A couple different types of ointments were tried and the composition of all of them is unknown.     Balance issues may have started in 2012 but progressed very mildly .  Muh mlre progression noted between 2022 and 2023 visits.    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  There was moderate chronic microvascular ischemic change.  MR angiogram that day was normal.  DATA:  NCV/EMG study 05/04/2019 showed the following: 1.   Chronic right C6 radiculopathy.  There could also be milder C5 and C7 chronic radiculopathies. 2.   Chronic left S1 radiculopathy. 3.   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   There was no evidence of polyneuropathy, motor neuron disease  or myopathy.  Laboratory data  05/04/2019: CK, acetylcholine receptor antibodies, anti-GM 1 IgG were normal or negative. 09/16/2019: Vitamin B12, ANA, vitamin B1, antigad, RPR were negative or normal.  Celiac panel was borderline.  REVIEW OF SYSTEMS: Constitutional: No fevers, chills, sweats, or change in appetite Eyes: No visual changes, double vision, eye pain Ear, nose and throat: No hearing loss, ear pain, nasal congestion, sore throat Cardiovascular: No chest pain, palpitations Respiratory:  No shortness of breath at rest or with exertion.   No wheezes GastrointestinaI: No nausea, vomiting, diarrhea, abdominal pain, fecal incontinence.   Has  GERD Genitourinary:  No dysuria, urinary retention or frequency.  No nocturia. Musculoskeletal:  No current neck pain, back pain Integumentary: No rash, pruritus, skin lesions Neurological: as above Psychiatric: No depression at this time.  No anxiety Endocrine: No palpitations, diaphoresis, change in appetite, change in weigh or increased thirst Hematologic/Lymphatic:  No anemia, purpura, petechiae. Allergic/Immunologic: No itchy/runny eyes, nasal congestion, recent allergic reactions, rashes  ALLERGIES: Allergies  Allergen Reactions   Morphine Hives and Itching   Codeine Hives and Itching    Other reaction(s): bad feeling   Propoxyphene Itching    DARVOCET   Shrimp [Shellfish Allergy] Rash    HOME MEDICATIONS:  Current Outpatient Medications:    acetaminophen (TYLENOL) 500 MG tablet, Take 500 mg by mouth every 6 (six) hours as needed for moderate pain or headache., Disp: , Rfl:    amiodarone (PACERONE) 200 MG tablet, Take 1 tablet (200 mg total) by mouth daily., Disp: 90 tablet, Rfl: 3   amLODipine (NORVASC) 2.5 MG tablet, TAKE ONE TABLET BY MOUTH TWICE A DAY, Disp: 180 tablet, Rfl: 2   aspirin EC 81 MG tablet, Take 1 tablet (81 mg total) by mouth daily. Swallow whole., Disp: 90 tablet, Rfl: 3   atorvastatin (LIPITOR) 40 MG tablet, TAKE 1 TABLET DAILY, Disp: 90 tablet, Rfl: 2   Cholecalciferol 25 MCG (1000 UT) tablet, Take 1,000 Units by mouth at bedtime., Disp: , Rfl:    diphenhydramine-acetaminophen (TYLENOL PM) 25-500 MG TABS tablet, Take 1 tablet by mouth at bedtime as needed (sleep)., Disp: , Rfl:    fluticasone (FLONASE) 50 MCG/ACT nasal spray, Place 1 spray into both nostrils daily as needed for allergies or rhinitis., Disp: , Rfl:    neomycin-polymyxin b-dexamethasone (MAXITROL) 3.5-10000-0.1 SUSP, Place 1 drop into both eyes as needed., Disp: , Rfl:    pantoprazole (PROTONIX) 40 MG tablet, Take 40 mg by mouth in the morning., Disp: , Rfl: 2   Probiotic Product (ALIGN) 4  MG CAPS, Take by mouth in the morning., Disp: , Rfl:    Propylene Glycol (SYSTANE BALANCE) 0.6 % SOLN, Place 1 drop into both eyes 2 (two) times daily as needed (burning)., Disp: , Rfl:    vitamin B-12 (CYANOCOBALAMIN) 1000 MCG tablet, Take 1,000 mcg by mouth at bedtime., Disp: , Rfl:    vitamin E 180 MG (400 UNITS) capsule, Take 400 Units by mouth at bedtime., Disp: , Rfl:   PAST MEDICAL HISTORY: 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   Cerebellar ataxia ([place])    Dysesthesia 06/04/2016   Essential hypertension 11/05/2019   Foot pain, left 06/04/2016   Gait disturbance 03/16/2019   History of cervical spinal surgery 06/04/2016   History of hiatal hernia    History of kidney stones    Hypertension    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   Memory loss 03/16/2019   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  Pain due to onychomycosis of toenails of both feet 04/28/2019   Pain in left ankle and joints of left foot 08/06/2018   Slurred speech 03/16/2019   Superficial peroneal nerve neuropathy, 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   TIA (transient ischemic attack) 11/05/2019   Worsened handwriting 03/16/2019    PAST SURGICAL HISTORY: See above  FAMILY HISTORY: Family History  Problem Relation Age of Onset   Stroke Neg Hx     SOCIAL HISTORY:  Social History   Socioeconomic History   Marital status: Married    Spouse name: Not on file   Number of children: Not on file   Years of education: Not on file   Highest education level: Not on file  Occupational History   Occupation: retired  Tobacco Use   Smoking status: Former    Packs/day: 1.00    Years: 30.00    Total pack years: 30.00    Types: Cigarettes    Quit date: 1995    Years since quitting: 29.1   Smokeless tobacco: Never  Vaping Use   Vaping Use: Never used  Substance and Sexual Activity   Alcohol use: Yes    Alcohol/week: 16.0 standard drinks of alcohol    Types: 14 Cans of beer, 2 Standard drinks or  equivalent per week    Comment: 2 beers a day   Drug use: Never   Sexual activity: Not on file  Other Topics Concern   Not on file  Social History Narrative   Not on file   Social Determinants of Health   Financial Resource Strain: Not on file  Food Insecurity: Not on file  Transportation Needs: Not on file  Physical Activity: Not on file  Stress: Not on file  Social Connections: Not on file  Intimate Partner Violence: Not on file     PHYSICAL EXAM  Vitals:   06/20/22 1132 06/20/22 1135  BP: (!) 144/76 137/71  Pulse: 66 64  Weight: 183 lb 9.6 oz (83.3 kg)   Height: '5\' 10"'$  (1.778 m)     Body mass index is 26.34 kg/m.   General: The patient is well-developed and well-nourished and in no acute distress    Skin/Ext: Extremities are without rash or edema.   He has well-healed surgical scars on the left foot.  These are near the sural nerve.    Neurologic Exam  Mental status: He appears to have reduced focus and attention.  Has no trouble following commands.   No aphasia  Cranial nerves: Extraocular movements are full.  Facial strength is normal.  His voice is slurred/ataxic.   Palatal elevation and tongue protrusion are midline.   No obvious hearing deficits are noted.  Motor:  Muscle bulk is normal.   Tone is normal. Strength is  5/5 in legs/feet including EHL   Sensory:   He has mildly reduced sensation in the left foot, in the distribution of the left superficial peroneal nerve.  Mildly reduced vibration sensation in ankles and toes.   Coordination: Cerebellar testing reveals reduced  finger-nose-finger and poor heel-to-shin bilaterally.  Gait and station: Station is normal.  The gait is ataxic and he cannot stand or walk without support.  He has a good stride with his walker. Reflexes: Deep tendon reflexes are symmetric and 3 at knees and 1 at ankles.       DIAGNOSTIC DATA (LABS, IMAGING, TESTING) - I reviewed patient records, labs, notes, testing and imaging  myself where available.  Lab Results  Component Value Date  WBC 7.0 10/31/2020   HGB 14.8 10/31/2020   HCT 45.7 10/31/2020   MCV 98.5 10/31/2020   PLT 336 10/31/2020      Component Value Date/Time   NA 141 09/27/2021 1050   K 5.3 (H) 09/27/2021 1050   CL 102 09/27/2021 1050   CO2 25 09/27/2021 1050   GLUCOSE 87 09/27/2021 1050   GLUCOSE 99 10/31/2020 1119   BUN 22 09/27/2021 1050   CREATININE 1.16 09/27/2021 1050   CALCIUM 10.0 09/27/2021 1050   PROT 7.4 09/27/2021 1050   ALBUMIN 4.7 (H) 09/27/2021 1050   AST 20 09/27/2021 1050   ALT 35 09/27/2021 1050   ALKPHOS 40 (L) 09/27/2021 1050   BILITOT 0.7 09/27/2021 1050   GFRNONAA >60 10/31/2020 1119   GFRAA 81 07/12/2020 1309       ASSESSMENT AND PLAN  Cerebellar ataxia (HCC)  Dysphagia, unspecified type  Gait disturbance  Slurred speech   1.   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  His onset was late.  Neither of his children have symptoms and they are in the late 50s, close to 59. 2.   Stay active and exercise as tolerated.   3.     Advised to take extra care - use grab bars or shower chair in shower.   He will return to see me in 12 months or sooner if there are new or worsening neurologic symptoms.Nanine Means. Felecia Shelling, MD, PhD 05/22/2447, 75:30 PM Certified in Neurology, Clinical Neurophysiology, Sleep Medicine, Pain Medicine and Neuroimaging  [facility] [address] [place], [place] [address] [phone]

## 2022-06-27 ENCOUNTER — Encounter (HOSPITAL_COMMUNITY): Payer: Self-pay | Admitting: *Deleted

## 2022-07-16 ENCOUNTER — Telehealth: Payer: Self-pay

## 2022-07-16 NOTE — Telephone Encounter (Signed)
Left message to call back.  Will arrange follow-up with Dr. Quentin Ore as planned in May.

## 2022-07-17 ENCOUNTER — Telehealth: Payer: Self-pay | Admitting: Neurology

## 2022-07-17 DIAGNOSIS — G119 Hereditary ataxia, unspecified: Secondary | ICD-10-CM

## 2022-07-17 DIAGNOSIS — R269 Unspecified abnormalities of gait and mobility: Secondary | ICD-10-CM

## 2022-07-17 NOTE — Telephone Encounter (Signed)
Pt's wife is asking for a call , pt would like additional PT

## 2022-07-17 NOTE — Telephone Encounter (Signed)
Called wife back. Felt PT helped previously and would like new referral placed for repeat PT to [facility]. Referral placed. She verbalized understanding.

## 2022-07-17 NOTE — Telephone Encounter (Signed)
Called to check in with patient, who had [place] on 07/27/2020. The patient reports doing well with no issues.  Scheduled him for 6 month follow-up with Dr. Quentin Ore in May 2024 per Roderic Palau. The patient understands to call with questions or concerns.

## 2022-07-18 ENCOUNTER — Ambulatory Visit: Payer: Medicare Other | Attending: Neurology

## 2022-07-18 DIAGNOSIS — R260 Ataxic gait: Secondary | ICD-10-CM | POA: Diagnosis not present

## 2022-07-18 DIAGNOSIS — G119 Hereditary ataxia, unspecified: Secondary | ICD-10-CM | POA: Diagnosis not present

## 2022-07-18 DIAGNOSIS — R269 Unspecified abnormalities of gait and mobility: Secondary | ICD-10-CM | POA: Diagnosis not present

## 2022-07-18 DIAGNOSIS — M6281 Muscle weakness (generalized): Secondary | ICD-10-CM | POA: Insufficient documentation

## 2022-07-18 DIAGNOSIS — R2681 Unsteadiness on feet: Secondary | ICD-10-CM | POA: Insufficient documentation

## 2022-07-18 DIAGNOSIS — R278 Other lack of coordination: Secondary | ICD-10-CM | POA: Diagnosis not present

## 2022-07-18 NOTE — Therapy (Signed)
OUTPATIENT PHYSICAL THERAPY NEURO EVALUATION   Patient Name: Derrick White MRN: [id number] DOB:1941/03/09, 82 y.o., male 27 Date: 07/18/2022   PCP: Domenick Gong, MD REFERRING PROVIDER: Britt Bottom, MD  END OF SESSION:  PT End of Session - 07/18/22 1224     Visit Number 1    Number of Visits 5    Date for PT Re-Evaluation 08/15/22    Authorization Type Medicare    PT Start Time 1230    PT Stop Time 1308    PT Time Calculation (min) 38 min    Equipment Utilized During Treatment Gait belt    Activity Tolerance Patient tolerated treatment well    Behavior During Therapy WFL for tasks assessed/performed             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   Cerebellar ataxia ([place])    Dysesthesia 06/04/2016   Essential hypertension 11/05/2019   Foot pain, left 06/04/2016   Gait disturbance 03/16/2019   History of cervical spinal surgery 06/04/2016   History of hiatal hernia    History of kidney stones    Hypertension    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   Memory loss 03/16/2019   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   Pain due to onychomycosis of toenails of both feet 04/28/2019   Pain in left ankle and joints of left foot 08/06/2018   Slurred speech 03/16/2019   Superficial peroneal nerve neuropathy, 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   TIA (transient ischemic attack) 11/05/2019   Worsened handwriting 03/16/2019   Past Surgical History:  Procedure Laterality Date   [place] N/A 09/13/2020   Procedure: CARDIOVERSION;  Surgeon: Sanda Klein, MD;  Location: [facility];  Service: Cardiovascular;  Laterality: N/A;   FRACTURE SURGERY     Left ankle, sx x4   INGUINAL HERNIA REPAIR Right 03/05/2019   Procedure: OPEN RIGHT INGUINAL HERNIA REPAIR WITH MESH;  Surgeon: Clovis Riley, MD;  Location: [facility];  Service: General;  Laterality: Right;   [place] N/A 07/27/2020   Procedure: LEFT ATRIAL APPENDAGE OCCLUSION;  Surgeon: Sherren Mocha, MD;  Location: [facility];  Service: Cardiovascular;  Laterality: N/A;   SHOULDER SURGERY Left    TE[place] WITHOUT CARDIOVERSION N/A 07/27/2020   Procedure: TRANSESOPHAGEAL ECHOCARDIOGRAM (TEE);  Surgeon: Sherren Mocha, MD;  Location: [facility];  Service: Cardiovascular;  Laterality: N/A;   TE[place] WITHOUT CARDIOVERSION N/A 09/13/2020   Procedure: TRANSESOPHAGEAL ECHOCARDIOGRAM (TEE);  Surgeon: Sanda Klein, MD;  Location: [facility];  Service: Cardiovascular;  Laterality: N/A;   TONSILLECTOMY     1945   Patient Active Problem List   Diagnosis Date Noted   Pain in right shoulder 07/31/2021   Persistent atrial fibrillation ([place]) 08/28/2020   Secondary hypercoagulable state ([place]) 07/20/2020   Paroxysmal atrial fibrillation ([place]) 06/19/2020   TIA (transient ischemic attack) 11/05/2019   Essential hypertension 11/05/2019   Cerebellar ataxia ([place]) 11/05/2019   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   C6 radiculopathy 05/04/2019   Pain due to onychomycosis of toenails of both feet 04/28/2019   Worsened handwriting 03/16/2019   Gait disturbance 03/16/2019   Slurred speech 03/16/2019   Memory loss 03/16/2019   Pain in left ankle and joints of left foot 08/06/2018   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   Foot pain, left 06/04/2016  Dysesthesia 06/04/2016   Superficial peroneal nerve neuropathy, 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   History of cervical spinal surgery 06/04/2016    ONSET DATE: 07/17/2022 referral  REFERRING DIAG: R26.9 (ICD-10-CM) - Gait disturbance G11.9 (ICD-10-CM) - Cerebellar ataxia (H[facility])  THERAPY DIAG:  Unsteadiness on feet - Plan: PT plan of care cert/re-cert  Muscle weakness (generalized) - Plan: PT plan of care cert/re-cert  Other lack of coordination - Plan: PT plan of care cert/re-cert  Ataxic gait - Plan: PT plan of care  cert/re-cert  Rationale for Evaluation and Treatment: Rehabilitation  SUBJECTIVE:                                                                                                                                                                                             SUBJECTIVE STATEMENT: Patient arrives to clinic using rollator- drove himself here. Reports no issues with driving and denies a dr ever telling him not to drive. He is familiar to this clinic and was last here 6 weeks ago. Reports no changes since last here. Reports he is still doing the exercises provided. States he has ~20 near falls/day when he is not using his walker. States he will "furniture surf" from den to bathroom and that's when he experiences his most near falls.  Pt accompanied by: self  PERTINENT HISTORY: C6 radiculopathy, Cerebellar ataxia ([place]), Dysesthesia, Essential hypertension, Foot pain (left), Gait disturbance, History of cervical spinal surgery (06/04/2016), History of hiatal hernia, History of kidney stones, 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, Memory loss, Mild cognitive impairment, Pain due to onychomycosis of toenails of both feet, Pain in left ankle and joints of left foot, Slurred speech, Superficial peroneal nerve neuropathy, left (06/04/2009),TIA (transient ischemic attack) 11/05/2019. NCV/EMG study did not show any evidence of motor neuron disease. We did the Athena SCA panel (30+ genes). It was borderline. 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 Unclear if this is playing a role (SCA type 6 has mutations in this [place] but usually triplet repeat)    PAIN:  Are you having pain? No  PRECAUTIONS: Fall  WEIGHT BEARING RESTRICTIONS: No  FALLS: Has patient fallen in last 6 months? No multiple daily near falls  LIVING ENVIRONMENT: Lives with: lives with their spouse Lives in: House/apartment Stairs: Yes: Internal: full flight steps; can reach both and External:  2-5 steps; can reach both Has following equipment at home: Single point cane, Quad cane small base, Walker - 4 wheeled, shower chair, Grab bars, and power scooter  PLOF: Independent with basic ADLs and Requires assistive device for independence  PATIENT GOALS: "to not get worse"  OBJECTIVE:   DIAGNOSTIC FINDINGS:  NCV/EMG study 05/04/2019 showed the following: 1.   Chronic right C6 radiculopathy.  There could also be milder C5 and C7 chronic radiculopathies. 2.   Chronic left S1 radiculopathy. 3.   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   There was no evidence of polyneuropathy, motor neuron disease or myopathy.  COGNITION: Overall cognitive status: Within functional limits for tasks assessed   SENSATION: Reports numbness in L foot related to injury/sx  COORDINATION: Dysmetric B LE heel shin/ figure 8 Slight increase in L UE ataxia with EC finger/(own) nose   EDEMA:  Intermittent dependent edema  POSTURE: rounded shoulders, increased thoracic kyphosis, posterior pelvic tilt, and flexed trunk   LOWER EXTREMITY MMT:    MMT Right Eval Left Eval  Hip flexion 5 5  Hip extension    Hip abduction 5 5  Hip adduction 5 5  Hip internal rotation    Hip external rotation    Knee flexion 5 5  Knee extension 5 5  Ankle dorsiflexion 5 5  Ankle plantarflexion 5 5  Ankle inversion    Ankle eversion    (Blank rows = not tested)  BED MOBILITY:  Patient reports independence, but uses "caution" to not slide off the side  TRANSFERS: Assistive device utilized: Environmental consultant - 4 wheeled  Sit to stand: Modified independence Stand to sit: Modified independence Chair to chair: Modified independence  GAIT: Gait pattern: step through pattern, decreased hip/knee flexion- Right, decreased hip/knee flexion- Left, Right foot flat, Left foot flat, ataxic, narrow BOS, poor foot clearance- Right, and poor foot clearance-  Left Distance walked: clinic Assistive device utilized: Walker - 4 wheeled Level of assistance: Modified independence  FUNCTIONAL TESTS:   [facility] PT Assessment - 07/18/22 0001       Standardized Balance Assessment   Five times sit to stand comments  15.8s no UE    10 Meter Walk .27ms w/ rollator      Timed Up and Go Test   Normal TUG (seconds) 21.5   w/ rollator           TODAY'S TREATMENT:                                                                                                                              N/A eval   PATIENT EDUCATION: Education details: PT POC, exam findings, re-printed HEP from previous bout of PT Person educated: Patient Education method: Explanation and Handouts Education comprehension: verbalized understanding and needs further education  HOME EXERCISE PROGRAM: Provided last bout of PT:  Access Code: [email or web address]: [email or web address] Date: 02/11/2022 Prepared by: JMickie BailPlaster   Exercises (using red theraband)  - Sit to Stand Without Arm Support  - 1 x daily - 7 x weekly - 3 sets - 10 reps - Step sideways with arms reaching at counter   - 1 x  daily - 7 x weekly - 3 sets - 10 reps - Standing Balance Activity: Plyometric Modified Lower Extremity Jumping Jack  - 1 x daily - 7 x weekly - 3 sets - 10 reps - [employer] on Counter  - 1 x daily - 7 x weekly - 3 sets - 10 reps - Side Stepping with Resistance at [employer] and Counter Support  - 1 x daily - 7 x weekly - 3 sets - 10 reps - Forward Backward Monster Walk with Band at [employer] and Counter Support  - 1 x daily - 7 x weekly - 3 sets - 10 reps  GOALS: Goals reviewed with patient? Yes  SHORT TERM GOALS: = LTG based on POC length LONG TERM GOALS: Target date: 08/15/22  Pt will be independent with updated HEP for improved balance and functional strength  Baseline: to be updated Goal status: INITIAL  2.  Pt will improve TUG to </= 16 secs to demonstrated reduced fall  risk  Baseline: 21.5s with rollator Goal status: INITIAL  3.  Pt will improve 5x STS to </= 13 sec to demo improved functional LE strength and balance   Baseline: 15.8s no UE Goal status: INITIAL  4.  Pt will improve gait speed to >/= .80ms to demonstrate improved community ambulation  Baseline: .64m Goal status: INITIAL  ASSESSMENT:  CLINICAL IMPRESSION: Patient is a [age].o. male who was seen today for physical therapy evaluation and treatment for gait impairment due to diagnosis of cerebellar ataxia. Patient was here recently. Continues to require cues to use rollator properly. 10 Meter Walk Test: Patient instructed to walk 10 meters (32.8 ft) as quickly and as safely as possible at their normal speed x2 and at a fast speed x2. Time measured from 2 meter mark to 8 meter mark to accommodate ramp-up and ramp-down.  Normal speed: .6722mCut off scores: <0.4 m/s = household Ambulator, 0.4-0.8 m/s = limited community Ambulator, >0.8 m/s = community Ambulator, >1.2 m/s = crossing a street, <1.0 = increased fall risk MCID 0.05 m/s (small), 0.13 m/s (moderate), 0.06 m/s (significant)  (ANPTA Core Set of Outcome Measures for Adults with Neurologic Conditions, 2018). Five times Sit to Stand Test (FTSS) Method: Use a straight back chair with a solid seat that is 17-18" high. Ask participant to sit on the chair with arms folded across their chest.   Instructions: "Stand up and sit down as quickly as possible 5 times, keeping your arms folded across your chest."   Measurement: Stop timing when the participant touches the chair in sitting the 5th time.  TIME: 15.8 sec  Cut off scores indicative of increased fall risk: >12 sec CVA, >16 sec PD, >13 sec vestibular (ANPTA Core Set of Outcome Measures for Adults with Neurologic Conditions, 2018). Patient completed the Timed Up and Go test (TUG) in 21.5 seconds.  Geriatrics: need for further assessment of fall risk: ? 12 sec; Recurrent falls: > 15  sec; Vestibular Disorders fall risk: > 15 sec; Parkinson's Disease fall risk: > 16 sec ([email or web address]). Patient would benefit from skilled PT to address the above mentioned deficits.   OBJECTIVE IMPAIRMENTS: Abnormal gait, decreased balance, decreased coordination, decreased knowledge of condition, decreased knowledge of use of DME, decreased safety awareness, and impaired perceived functional ability.   ACTIVITY LIMITATIONS: carrying, squatting, stairs, transfers, locomotion level, and caring for others  PARTICIPATION LIMITATIONS: cleaning, driving, community activity, and yard work  PERSONAL FACTORS: Age, Behavior pattern, Past/current experiences, Time since onset of  injury/illness/exacerbation, and 3+ comorbidities: C6 radiculopathy, HTN, lumbosacral radiculopathy  are also affecting patient's functional outcome.   REHAB POTENTIAL: Fair progressive dx  CLINICAL DECISION MAKING: Stable/uncomplicated  EVALUATION COMPLEXITY: Low  PLAN:  PT FREQUENCY: 1x/week  PT DURATION: 4 weeks  PLANNED INTERVENTIONS: Therapeutic exercises, Therapeutic activity, Neuromuscular re-education, Balance training, Gait training, Patient/Family education, Self Care, Joint mobilization, Stair training, Vestibular training, Visual/preceptual remediation/compensation, Orthotic/Fit training, DME instructions, Aquatic Therapy, Manual therapy, and Re-evaluation  PLAN FOR NEXT SESSION: revamp HEP, TUG review   Debbora Dus, PT Debbora Dus, PT, DPT, CBIS  07/18/2022, 1:42 PM

## 2022-07-24 ENCOUNTER — Ambulatory Visit: Payer: Medicare Other | Attending: Neurology

## 2022-07-24 DIAGNOSIS — R2681 Unsteadiness on feet: Secondary | ICD-10-CM | POA: Insufficient documentation

## 2022-07-24 DIAGNOSIS — R278 Other lack of coordination: Secondary | ICD-10-CM | POA: Insufficient documentation

## 2022-07-24 DIAGNOSIS — M6281 Muscle weakness (generalized): Secondary | ICD-10-CM | POA: Insufficient documentation

## 2022-07-24 DIAGNOSIS — Z9181 History of falling: Secondary | ICD-10-CM | POA: Diagnosis not present

## 2022-07-24 DIAGNOSIS — R260 Ataxic gait: Secondary | ICD-10-CM | POA: Diagnosis not present

## 2022-07-24 NOTE — Therapy (Signed)
OUTPATIENT PHYSICAL THERAPY NEURO TREATMENT   Patient Name: Derrick White MRN: [id number] DOB:[birth year], 82 y.o., male Today's Date: 07/24/2022   PCP: Domenick Gong, MD REFERRING PROVIDER: Britt Bottom, MD  END OF SESSION:  PT End of Session - 07/24/22 1010     Visit Number 2    Number of Visits 5    Date for PT Re-Evaluation 08/15/22    Authorization Type Medicare    PT Start Time 1015    PT Stop Time 1055    PT Time Calculation (min) 40 min    Equipment Utilized During Treatment Gait belt    Activity Tolerance Patient tolerated treatment well    Behavior During Therapy WFL for tasks assessed/performed             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   Cerebellar ataxia ([place])    Dysesthesia 06/04/2016   Essential hypertension 11/05/2019   Foot pain, left 06/04/2016   Gait disturbance 03/16/2019   History of cervical spinal surgery 06/04/2016   History of hiatal hernia    History of kidney stones    Hypertension    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   Memory loss 03/16/2019   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   Pain due to onychomycosis of toenails of both feet 04/28/2019   Pain in left ankle and joints of left foot 08/06/2018   Slurred speech 03/16/2019   Superficial peroneal nerve neuropathy, 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   TIA (transient ischemic attack) 11/05/2019   Worsened handwriting 03/16/2019   Past Surgical History:  Procedure Laterality Date   [place] N/A 09/13/2020   Procedure: CARDIOVERSION;  Surgeon: Sanda Klein, MD;  Location: [facility];  Service: Cardiovascular;  Laterality: N/A;   FRACTURE SURGERY     Left ankle, sx x4   INGUINAL HERNIA REPAIR Right 03/05/2019   Procedure: OPEN RIGHT INGUINAL HERNIA REPAIR WITH MESH;  Surgeon: Clovis Riley, MD;  Location: [facility];  Service: General;  Laterality: Right;   [place] N/A 07/27/2020   Procedure: LEFT ATRIAL APPENDAGE OCCLUSION;  Surgeon: Sherren Mocha, MD;  Location: [facility];  Service: Cardiovascular;  Laterality: N/A;   SHOULDER SURGERY Left    TEE WITHOUT CARDIOVERSION N/A 07/27/2020   Procedure: TRANSESOPHAGEAL ECHOCARDIOGRAM (TEE);  Surgeon: Sherren Mocha, MD;  Location: [facility];  Service: Cardiovascular;  Laterality: N/A;   TE[place] WITHOUT CARDIOVERSION N/A 09/13/2020   Procedure: TRANSESOPHAGEAL ECHOCARDIOGRAM (TEE);  Surgeon: Sanda Klein, MD;  Location: [facility]OSCOPY;  Service: Cardiovascular;  Laterality: N/A;   TONSILLECTOMY     1945   Patient Active Problem List   Diagnosis Date Noted   Pain in right shoulder 07/31/2021   Persistent atrial fibrillation ([place]) 08/28/2020   Secondary hypercoagulable state ([place]) 07/20/2020   Paroxysmal atrial fibrillation ([place]) 06/19/2020   TIA (transient ischemic attack) 11/05/2019   Essential hypertension 11/05/2019   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/05/2019   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   C6 radiculopathy 05/04/2019   Pain due to onychomycosis of toenails of both feet 04/28/2019   Worsened handwriting 03/16/2019   Gait disturbance 03/16/2019   Slurred speech 03/16/2019   Memory loss 03/16/2019   Pain in left ankle and joints of left foot 08/06/2018   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   Foot pain, left 06/04/2016  Dysesthesia 06/04/2016   Superficial peroneal nerve neuropathy, 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   History of cervical spinal surgery 06/04/2016    ONSET DATE: 07/17/2022 referral  REFERRING DIAG: R26.9 (ICD-10-CM) - Gait disturbance G11.9 (ICD-10-CM) - Cerebellar ataxia (HCC)  THERAPY DIAG:  Unsteadiness on feet  Muscle weakness (generalized)  Other lack of coordination  Ataxic gait  Rationale for Evaluation and Treatment: Rehabilitation  SUBJECTIVE:                                                                                                                                                                                              SUBJECTIVE STATEMENT: Patient arrives to clinic using rollator. He reports using it 85% of the time. Continues to require verbal cues to correctly sequence sitting with rollator (not placing it to the side and reaching back to sit). Denies falls/near falls.  Pt accompanied by: self  PERTINENT HISTORY: C6 radiculopathy, Cerebellar ataxia ([place]), Dysesthesia, Essential hypertension, Foot pain (left), Gait disturbance, History of cervical spinal surgery (06/04/2016), History of hiatal hernia, History of kidney stones, 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, Memory loss, Mild cognitive impairment, Pain due to onychomycosis of toenails of both feet, Pain in left ankle and joints of left foot, Slurred speech, Superficial peroneal nerve neuropathy, left (06/04/2009),TIA (transient ischemic attack) 11/05/2019. NCV/EMG study did not show any evidence of motor neuron disease. We did the Athena SCA panel (30+ genes). It was borderline. 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 Unclear if this is playing a role (SCA type 6 has mutations in this [place] but usually triplet repeat)    PAIN:  Are you having pain? No  PRECAUTIONS: Fall  PATIENT GOALS: "to not get worse"  OBJECTIVE:   DIAGNOSTIC FINDINGS:  NCV/EMG study 05/04/2019 showed the following: 1.   Chronic right C6 radiculopathy.  There could also be milder C5 and C7 chronic radiculopathies. 2.   Chronic left S1 radiculopathy. 3.   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   There was no evidence of polyneuropathy, motor neuron disease or myopathy.   TODAY'S TREATMENT:  THERACT: -blocked practice TUG with semi-consistent verbal cues for  correct sequencing with rollator (poor carryover between bouts)  -sit <> stand on A/P rockerboard with grossly MinA and limited ability for anterior weight shift  -in // bars: step up to 6" box + airex-> knee hike   -B UE -> U UE + MinA  -apparent ataxia and incoordination with poor foot placement   -added 4# ankle weights B LE with mild improvement in coordination, but deterioration of balance  -in // bars: fwd walking stepping over 4" hurdles with 4# ankle weights B LE with B UE support + CGA -> U UE + MinA -scifit hills level 3 x8 mins B UE/LE for large amplitude reciprocal coordination -ended with 3x TUG with Min cues for correct/safe form using rollator (some carryover noted from beginning of session)    PATIENT EDUCATION: Education details: continue HEP, proper use of rollator, not riding an upright bicycle Person educated: Patient Education method: Explanation and Handouts Education comprehension: verbalized understanding and needs further education  HOME EXERCISE PROGRAM: Provided last bout of PT:  Access Code: [email or web address] URL: [email or web address] Date: 02/11/2022 Prepared by: Mickie Bail Plaster   Exercises (using red theraband)  - Sit to Stand Without Arm Support  - 1 x daily - 7 x weekly - 3 sets - 10 reps - Step sideways with arms reaching at counter   - 1 x daily - 7 x weekly - 3 sets - 10 reps - Standing Balance Activity: Plyometric Modified Lower Extremity Jumping Jack  - 1 x daily - 7 x weekly - 3 sets - 10 reps - [employer] on Counter  - 1 x daily - 7 x weekly - 3 sets - 10 reps - Side Stepping with Resistance at [employer] and Counter Support  - 1 x daily - 7 x weekly - 3 sets - 10 reps - Forward Backward Monster Walk with Band at [employer] and Counter Support  - 1 x daily - 7 x weekly - 3 sets - 10 reps  GOALS: Goals reviewed with patient? Yes  SHORT TERM GOALS: = LTG based on POC length LONG TERM GOALS: Target date: 08/15/22  Pt will be independent with  updated HEP for improved balance and functional strength  Baseline: to be updated Goal status: INITIAL  2.  Pt will improve TUG to </= 16 secs to demonstrated reduced fall risk  Baseline: 21.5s with rollator Goal status: INITIAL  3.  Pt will improve 5x STS to </= 13 sec to demo improved functional LE strength and balance   Baseline: 15.8s no UE Goal status: INITIAL  4.  Pt will improve gait speed to >/= .13ms to demonstrate improved community ambulation  Baseline: .669m Goal status: INITIAL  ASSESSMENT:  CLINICAL IMPRESSION: Patient seen for skilled PT session with emphasis on reviewing correct/safe use of rollator for functional mobility and gross coordination. Patient continues to require verbal cues for locking brakes, pushing up from surface (not pulling on rollator to stand), feeling surface behind his legs before sitting and controlling decent onto surface. These concepts were reviewed in patients previous bout of therapy as well. Ataxia slightly mitigated by added weight for proprioceptive input, but additional distal weight forces deterioration of patients balance. Continue POC.    OBJECTIVE IMPAIRMENTS: Abnormal gait, decreased balance, decreased coordination, decreased knowledge of condition, decreased knowledge of use of DME, decreased safety awareness, and impaired perceived functional ability.   ACTIVITY LIMITATIONS: carrying, squatting, stairs, transfers, locomotion level, and caring for others  PARTICIPATION LIMITATIONS: cleaning, driving, community activity, and yard work  PERSONAL FACTORS: Age, Behavior pattern, Past/current experiences, Time since onset of injury/illness/exacerbation, and 3+ comorbidities: C6 radiculopathy, HTN, lumbosacral radiculopathy  are also affecting patient's functional outcome.   REHAB POTENTIAL: Fair progressive dx  CLINICAL DECISION MAKING: Stable/uncomplicated  EVALUATION COMPLEXITY: Low  PLAN:  PT FREQUENCY: 1x/week  PT  DURATION: 4 weeks  PLANNED INTERVENTIONS: Therapeutic exercises, Therapeutic activity, Neuromuscular re-education, Balance training, Gait training, Patient/Family education, Self Care, Joint mobilization, Stair training, Vestibular training, Visual/preceptual remediation/compensation, Orthotic/Fit training, DME instructions, Aquatic Therapy, Manual therapy, and Re-evaluation  PLAN FOR NEXT SESSION: revamp HEP, ball slams, modified plyometrics, kettlebells?   Debbora Dus, PT Debbora Dus, PT, DPT, CBIS  07/24/2022, 11:01 AM

## 2022-07-31 ENCOUNTER — Ambulatory Visit: Payer: Medicare Other | Admitting: Physical Therapy

## 2022-07-31 DIAGNOSIS — M6281 Muscle weakness (generalized): Secondary | ICD-10-CM | POA: Diagnosis not present

## 2022-07-31 DIAGNOSIS — R260 Ataxic gait: Secondary | ICD-10-CM

## 2022-07-31 DIAGNOSIS — R278 Other lack of coordination: Secondary | ICD-10-CM | POA: Diagnosis not present

## 2022-07-31 DIAGNOSIS — Z9181 History of falling: Secondary | ICD-10-CM | POA: Diagnosis not present

## 2022-07-31 DIAGNOSIS — R2681 Unsteadiness on feet: Secondary | ICD-10-CM

## 2022-07-31 NOTE — Therapy (Signed)
OUTPATIENT PHYSICAL THERAPY NEURO TREATMENT   Patient Name: Derrick White MRN: [id number] DOB:1941/01/02, 82 y.o., male Today's Date: 07/31/2022   PCP: Domenick Gong, MD REFERRING PROVIDER: Britt Bottom, MD  END OF SESSION:  PT End of Session - 07/31/22 1102     Visit Number 3    Number of Visits 5    Date for PT Re-Evaluation 08/15/22    Authorization Type Medicare    PT Start Time 1100    PT Stop Time 1143    PT Time Calculation (min) 43 min    Equipment Utilized During Treatment Gait belt    Activity Tolerance Patient tolerated treatment well    Behavior During Therapy WFL for tasks assessed/performed              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   Cerebellar ataxia ([place])    Dysesthesia 06/04/2016   Essential hypertension 11/05/2019   Foot pain, left 06/04/2016   Gait disturbance 03/16/2019   History of cervical spinal surgery 06/04/2016   History of hiatal hernia    History of kidney stones    Hypertension    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   Memory loss 03/16/2019   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   Pain due to onychomycosis of toenails of both feet 04/28/2019   Pain in left ankle and joints of left foot 08/06/2018   Slurred speech 03/16/2019   Superficial peroneal nerve neuropathy, 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   TIA (transient ischemic attack) 11/05/2019   Worsened handwriting 03/16/2019   Past Surgical History:  Procedure Laterality Date   [place] N/A 09/13/2020   Procedure: CARDIOVERSION;  Surgeon: Sanda Klein, MD;  Location: [place];  Service: Cardiovascular;  Laterality: N/A;   FRACTURE SURGERY     Left ankle, sx x4   INGUINAL HERNIA REPAIR Right 03/05/2019   Procedure: OPEN RIGHT INGUINAL HERNIA REPAIR WITH MESH;  Surgeon: Clovis Riley, MD;  Location: [place];  Service: General;  Laterality: Right;   [place] N/A 07/27/2020   Procedure: LEFT ATRIAL APPENDAGE OCCLUSION;  Surgeon: Sherren Mocha, MD;  Location: [place] [facility];  Service: Cardiovascular;  Laterality: N/A;   SHOULDER SURGERY Left    [place] WITHOUT CARDIOVERSION N/A 07/27/2020   Procedure: TRANSESOPHAGEAL ECHOCARDIOGRAM (TEE);  Surgeon: Sherren Mocha, MD;  Location: [place] [facility];  Service: Cardiovascular;  Laterality: N/A;   [place] WITHOUT CARDIOVERSION N/A 09/13/2020   Procedure: TRANSESOPHAGEAL ECHOCARDIOGRAM (TEE);  Surgeon: Sanda Klein, MD;  Location: [facility] END[facility];  Service: Cardiovascular;  Laterality: N/A;   TONSILLECTOMY     1945   Patient Active Problem List   Diagnosis Date Noted   Pain in right shoulder 07/31/2021   Persistent atrial fibrillation ([place]) 08/28/2020   Secondary hypercoagulable state ([place]) 07/20/2020   Paroxysmal atrial fibrillation ([place]) 06/19/2020   TIA (transient ischemic attack) 11/05/2019   Essential hypertension 11/05/2019   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/05/2019   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   C6 radiculopathy 05/04/2019   Pain due to onychomycosis of toenails of both feet 04/28/2019   Worsened handwriting 03/16/2019   Gait disturbance 03/16/2019   Slurred speech 03/16/2019   Memory loss 03/16/2019   Pain in left ankle and joints of left foot 08/06/2018   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   Foot pain, left 06/04/2016  Dysesthesia 06/04/2016   Superficial peroneal nerve neuropathy, 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 Denies falls or acute changes since last visit. Reports using the rollator "most of the time" at home.   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 NCV/EMG study did not show any evidence of motor neuron disease. We did the Athena SCA panel (30+ genes). It was borderline. 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 Unclear if this is playing a role (SCA type 6 has mutations in this [place] but usually triplet repeat)    PAIN:  Are you having pain? No  PRECAUTIONS: Fall  PATIENT GOALS: "to not get worse"  OBJECTIVE:   DIAGNOSTIC FINDINGS:  NCV/EMG study 05/04/2019 showed the following: 1.   Chronic right C6 radiculopathy.  There could also be milder C5 and C7 chronic radiculopathies. 2.   Chronic left S1 radiculopathy. 3.   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   There was no evidence of polyneuropathy, motor neuron disease or myopathy.   TODAY'S TREATMENT:  Ther Ex Updated and reviewed HEP (see bolded below) from previous bout of therapy for improved BLE strength, LE coordination and plyometrics. Pt demonstrated jumping jacks well but  had significant difficulty w/modified mountain climbers. Added eccentric heel taps from bottom step to work on proper foot placement and single leg stability.   NMR  In // bars, farmer's carries using single 12# KB in fwd and retro direction, x3 each direction on each side w/CGA, for improved core stability and coordination. Min cues for increased step length/clearance in retro direction, especially w/RLE. Noted increased instability while holding KB on L side vs R side.  Blaze pods for improved LE coordination, stability with turns and single leg stability. Used 5 pods (2 minute rounds on random reach setting) placed in semi-circle around pt: Round 1: 21 hits w/CGA. Noted good weight shift and single leg stability throughout Round 2: added 4# ankle weights to BLEs for added challenge, 12 hits w/min A due to posterior LOB on several occasions. Noted increased instability when tapping w/LLE.  Round 3: 17 hits, 4# ankle weights, 2 pods elevated on 4" step and one pod on 2" step, min A throughout. Pt most challenged by 4" step and frequently lost balance posteriorly w/ each tap to 4" step.     PATIENT EDUCATION: Education details: Updated HEP, importance of using rollator at all times and safety risks associated with furniture walking Person educated: Patient Education method: Explanation and Handouts Education comprehension: verbalized understanding and needs further education  HOME EXERCISE PROGRAM: Access Code: [email or web address] URL: [email or web address] Date: 07/31/2022 Prepared by: Mickie Bail Casey Fye  Exercises - Sit to Stand Without Arm Support  - 1 x daily - 7 x weekly - 3 sets - 10 reps - Step sideways with arms reaching at counter   - 1 x daily - 7 x weekly - 3 sets - 10 reps - Standing Balance Activity: Plyometric Modified Lower Extremity Jumping Jack  - 1 x daily - 7 x weekly - 3 sets - 10 reps - [employer] on Counter  - 1 x daily - 7 x weekly - 3 sets - 10 reps - Forward  Step Down with Heel Tap and Rail Support  - 1 x daily - 7 x weekly - 3 sets - 10 reps  GOALS: Goals reviewed with patient? Yes  SHORT TERM GOALS: = LTG based on POC length LONG TERM GOALS: Target date: 08/15/22  Pt will be independent with updated HEP for improved balance and functional strength  Baseline: to be updated Goal status: INITIAL  2.  Pt will improve TUG to </= 16 secs to demonstrated reduced fall risk  Baseline: 21.5s with rollator Goal status: INITIAL  3.  Pt will improve 5x STS to </= 13 sec to demo improved functional LE strength and balance   Baseline: 15.8s no UE Goal status: INITIAL  4.  Pt will improve gait speed to >/= .68ms to demonstrate improved community ambulation  Baseline: .682m Goal status: INITIAL  ASSESSMENT:  CLINICAL IMPRESSION: Emphasis of skilled PT session on LE coordination, single leg stability and stability w/directional changes. Pt continues to report ~85% compliance w/using rollator at all times, so continued to educate pt on purpose of using AD and safety risks associated w/non-compliance. Pt reports his current rollator is heavy, so provided info on other types that are lighter in weight and easier to transport. Pt continues to have difficulty w/retro stepping, turns and proper weight shift for stability w/single leg tasks but did improve today  due to ability to slow down movement. Continue POC.    OBJECTIVE IMPAIRMENTS: Abnormal gait, decreased balance, decreased coordination, decreased knowledge of condition, decreased knowledge of use of DME, decreased safety awareness, and impaired perceived functional ability.   ACTIVITY LIMITATIONS: carrying, squatting, stairs, transfers, locomotion level, and caring for others  PARTICIPATION LIMITATIONS: cleaning, driving, community activity, and yard work  PERSONAL FACTORS: Age, Behavior pattern, Past/current experiences, Time since onset of injury/illness/exacerbation, and 3+ comorbidities: C6  radiculopathy, HTN, lumbosacral radiculopathy  are also affecting patient's functional outcome.   REHAB POTENTIAL: Fair progressive dx  CLINICAL DECISION MAKING: Stable/uncomplicated  EVALUATION COMPLEXITY: Low  PLAN:  PT FREQUENCY: 1x/week  PT DURATION: 4 weeks  PLANNED INTERVENTIONS: Therapeutic exercises, Therapeutic activity, Neuromuscular re-education, Balance training, Gait training, Patient/Family education, Self Care, Joint mobilization, Stair training, Vestibular training, Visual/preceptual remediation/compensation, Orthotic/Fit training, DME instructions, Aquatic Therapy, Manual therapy, and Re-evaluation  PLAN FOR NEXT SESSION:  ball slams, modified plyometrics, kettlebells? LE coordination tasks, retro gait, is pt using rollator 100% of time?    Cruzita Lederer Kirstyn Lean, PT, DPT 07/31/2022, 11:50 AM

## 2022-08-07 ENCOUNTER — Ambulatory Visit: Payer: Medicare Other | Admitting: Physical Therapy

## 2022-08-07 DIAGNOSIS — M6281 Muscle weakness (generalized): Secondary | ICD-10-CM

## 2022-08-07 DIAGNOSIS — R260 Ataxic gait: Secondary | ICD-10-CM

## 2022-08-07 DIAGNOSIS — Z9181 History of falling: Secondary | ICD-10-CM

## 2022-08-07 DIAGNOSIS — R278 Other lack of coordination: Secondary | ICD-10-CM

## 2022-08-07 DIAGNOSIS — R2681 Unsteadiness on feet: Secondary | ICD-10-CM | POA: Diagnosis not present

## 2022-08-07 NOTE — Therapy (Signed)
OUTPATIENT PHYSICAL THERAPY NEURO TREATMENT   Patient Name: Derrick White MRN: [id number] DOB:17-Dec-1940, 82 y.o., male 12 Date: 08/07/2022   PCP: Domenick Gong, MD REFERRING PROVIDER: Britt Bottom, MD  END OF SESSION:  PT End of Session - 08/07/22 1020     Visit Number 4    Number of Visits 5    Date for PT Re-Evaluation 08/15/22    Authorization Type Medicare    PT Start Time [id number]    PT Stop Time 1057    PT Time Calculation (min) 42 min    Equipment Utilized During Treatment Gait belt    Activity Tolerance Patient tolerated treatment well    Behavior During Therapy WFL for tasks assessed/performed               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   Cerebellar ataxia ([place])    Dysesthesia 06/04/2016   Essential hypertension 11/05/2019   Foot pain, left 06/04/2016   Gait disturbance 03/16/2019   History of cervical spinal surgery 06/04/2016   History of hiatal hernia    History of kidney stones    Hypertension    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   Memory loss 03/16/2019   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   Pain due to onychomycosis of toenails of both feet 04/28/2019   Pain in left ankle and joints of left foot 08/06/2018   Slurred speech 03/16/2019   Superficial peroneal nerve neuropathy, 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   TIA (transient ischemic attack) 11/05/2019   Worsened handwriting 03/16/2019   Past Surgical History:  Procedure Laterality Date   [place] N/A 09/13/2020   Procedure: CARDIOVERSION;  Surgeon: Sanda Klein, MD;  Location: [place];  Service: Cardiovascular;  Laterality: N/A;   FRACTURE SURGERY     Left ankle, sx x4   INGUINAL HERNIA REPAIR Right 03/05/2019   Procedure: OPEN RIGHT INGUINAL HERNIA REPAIR WITH MESH;  Surgeon: Clovis Riley, MD;  Location: [place];  Service: General;  Laterality: Right;   [place] N/A 07/27/2020   Procedure: LEFT ATRIAL APPENDAGE OCCLUSION;  Surgeon: Sherren Mocha, MD;  Location: [place] [facility];  Service: Cardiovascular;  Laterality: N/A;   SHOULDER SURGERY Left    TEE WITHOUT CARDIOVERSION N/A 07/27/2020   Procedure: TRANSESOPHAGEAL ECHOCARDIOGRAM (TEE);  Surgeon: Sherren Mocha, MD;  Location: [place] [facility];  Service: Cardiovascular;  Laterality: N/A;   TEE WITHOUT CARDIOVERSION N/A 09/13/2020   Procedure: TRANSESOPHAGEAL ECHOCARDIOGRAM (TEE);  Surgeon: Sanda Klein, MD;  Location: [facility] ENDOSCOPY;  Service: Cardiovascular;  Laterality: N/A;   TONSILLECTOMY     1945   Patient Active Problem List   Diagnosis Date Noted   Pain in right shoulder 07/31/2021   Persistent atrial fibrillation ([place]) 08/28/2020   Secondary hypercoagulable state ([place]) 07/20/2020   Paroxysmal atrial fibrillation ([place]) 06/19/2020   TIA (transient ischemic attack) 11/05/2019   Essential hypertension 11/05/2019   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/05/2019   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   C6 radiculopathy 05/04/2019   Pain due to onychomycosis of toenails of both feet 04/28/2019   Worsened handwriting 03/16/2019   Gait disturbance 03/16/2019   Slurred speech 03/16/2019   Memory loss 03/16/2019   Pain in left ankle and joints of left foot 08/06/2018   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   Foot pain, left 06/04/2016  Dysesthesia 06/04/2016   Superficial peroneal nerve neuropathy, 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   History of cervical spinal surgery 06/04/2016    ONSET DATE: 07/17/2022 referral  REFERRING DIAG: R26.9 (ICD-10-CM) - Gait disturbance G11.9 (ICD-10-CM) - Cerebellar ataxia (HCC)  THERAPY DIAG:  Other lack of coordination  Unsteadiness on feet  Ataxic gait  Muscle weakness (generalized)  History of falling  Rationale for Evaluation and Treatment: Rehabilitation  SUBJECTIVE:                                                                                                                                                                                              SUBJECTIVE STATEMENT: Pt reports no falls or acute changes since last visit. No pain today. Pt has been working on his HEP, it is going well except for the jumping jacks which remain challenging.  Pt accompanied by: self  PERTINENT HISTORY: C6 radiculopathy, Cerebellar ataxia ([place]), Dysesthesia, Essential hypertension, Foot pain (left), Gait disturbance, History of cervical spinal surgery (06/04/2016), History of hiatal hernia, History of kidney stones, 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, Memory loss, Mild cognitive impairment, Pain due to onychomycosis of toenails of both feet, Pain in left ankle and joints of left foot, Slurred speech, Superficial peroneal nerve neuropathy, left (06/04/2009),TIA (transient ischemic attack) 11/05/2019. NCV/EMG study did not show any evidence of motor neuron disease. We did the Athena SCA panel (30+ genes). It was borderline. 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 Unclear if this is playing a role (SCA type 6 has mutations in this [place] but usually triplet repeat)    PAIN:  Are you having pain? No  PRECAUTIONS: Fall  PATIENT GOALS: "to not get worse"  OBJECTIVE:   DIAGNOSTIC FINDINGS:  NCV/EMG study 05/04/2019 showed the following: 1.   Chronic right C6 radiculopathy.  There could also be milder C5 and C7 chronic radiculopathies. 2.   Chronic left S1 radiculopathy. 3.   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   There was no evidence of polyneuropathy, motor neuron disease or myopathy.   TODAY'S TREATMENT:  NMR  Standing with focus on maintaining balance with forward weight shift/step: Alt L/R forward  step with 10# slam ball Needs mod A to prevent fall due to anterior LOB, increased LOB when stepping with RLE Standing balance with focus on SLS stability with min A: Blaze Pods Random One Color Tap (5 pods) Pt scores 15, 20, 18 taps Added in 4# B ankle weights Pt scores 19, 19, 16 taps LOB anteriorly and to the R Removed ankle weights, pt scores: 19, 18, 15 taps At countertop with one UE support: Backwards gait 6 x 10 ft Decreased width of BOS, decreased B step length (R>L) with onset of fatigue as well as increased lateral lean and pt rotating trunk to reach for countertop with UE support   PATIENT EDUCATION: Education details: continue HEP, PT POC with plan to d/c next session Person educated: Patient Education method: Explanation Education comprehension: verbalized understanding and needs further education  HOME EXERCISE PROGRAM: Access Code: [email or web address] URL: [email or web address] Date: 07/31/2022 Prepared by: Mickie Bail Plaster  Exercises - Sit to Stand Without Arm Support  - 1 x daily - 7 x weekly - 3 sets - 10 reps - Step sideways with arms reaching at counter   - 1 x daily - 7 x weekly - 3 sets - 10 reps - Standing Balance Activity: Plyometric Modified Lower Extremity Jumping Jack  - 1 x daily - 7 x weekly - 3 sets - 10 reps - [employer] on Counter  - 1 x daily - 7 x weekly - 3 sets - 10 reps - Forward Step Down with Heel Tap and Rail Support  - 1 x daily - 7 x weekly - 3 sets - 10 reps  GOALS: Goals reviewed with patient? Yes  SHORT TERM GOALS: = LTG based on POC length LONG TERM GOALS: Target date: 08/15/22  Pt will be independent with updated HEP for improved balance and functional strength  Baseline: to be updated Goal status: INITIAL  2.  Pt will improve TUG to </= 16 secs to demonstrated reduced fall risk  Baseline: 21.5s with rollator Goal status: INITIAL  3.  Pt will improve 5x STS to </= 13 sec to demo improved functional LE strength and  balance   Baseline: 15.8s no UE Goal status: INITIAL  4.  Pt will improve gait speed to >/= .71m/s to demonstrate improved community ambulation  Baseline: .11m/s Goal status: INITIAL  ASSESSMENT:  CLINICAL IMPRESSION: Emphasis of skilled PT session on continuing to work on standing balance with SLS and stepping. Pt continues to exhibit difficulty with maintaining his standing balance during SLS, decreased stability on RLE as compared to LLE. Pt does appear to have improved safety awareness and uses rollator and brakes appropriately during session. Pt understanding of plan to d/c from PT next session. Continue POC.    OBJECTIVE IMPAIRMENTS: Abnormal gait, decreased balance, decreased coordination, decreased knowledge of condition, decreased knowledge of use of DME, decreased safety awareness, and impaired perceived functional ability.   ACTIVITY LIMITATIONS: carrying, squatting, stairs, transfers, locomotion level, and caring for others  PARTICIPATION LIMITATIONS: cleaning, driving, community activity, and yard work  PERSONAL FACTORS: Age, Behavior pattern, Past/current experiences, Time since onset of injury/illness/exacerbation, and 3+ comorbidities: C6 radiculopathy, HTN, lumbosacral radiculopathy  are also affecting patient's functional outcome.   REHAB POTENTIAL: Fair progressive dx  CLINICAL DECISION MAKING: Stable/uncomplicated  EVALUATION COMPLEXITY: Low  PLAN:  PT FREQUENCY: 1x/week  PT DURATION: 4 weeks  PLANNED INTERVENTIONS: Therapeutic exercises, Therapeutic activity, Neuromuscular re-education,  Balance training, Gait training, Patient/Family education, Self Care, Joint mobilization, Stair training, Vestibular training, Visual/preceptual remediation/compensation, Orthotic/Fit training, DME instructions, Aquatic Therapy, Manual therapy, and Re-evaluation  PLAN FOR NEXT SESSION:  assess LTG and d/c from PT   Excell Seltzer, PT, DPT, CSRS 08/07/2022, 10:57  AM

## 2022-08-14 ENCOUNTER — Ambulatory Visit: Payer: Medicare Other

## 2022-08-14 DIAGNOSIS — M6281 Muscle weakness (generalized): Secondary | ICD-10-CM

## 2022-08-14 DIAGNOSIS — Z9181 History of falling: Secondary | ICD-10-CM | POA: Diagnosis not present

## 2022-08-14 DIAGNOSIS — R278 Other lack of coordination: Secondary | ICD-10-CM

## 2022-08-14 DIAGNOSIS — R2681 Unsteadiness on feet: Secondary | ICD-10-CM

## 2022-08-14 DIAGNOSIS — R260 Ataxic gait: Secondary | ICD-10-CM

## 2022-08-14 NOTE — Therapy (Signed)
OUTPATIENT PHYSICAL THERAPY NEURO TREATMENT   Patient Name: Derrick White MRN: [id number] DOB:1940-09-24, 82 y.o., Derrick Today's Date: 08/14/2022   PCP: Domenick Gong, MD REFERRING PROVIDER: Britt Bottom, MD   PHYSICAL THERAPY DISCHARGE SUMMARY  Visits from Start of Care: 5  Current functional level related to goals / functional outcomes: See below   Remaining deficits: See below   Education / Equipment: PT POC, HEP, safe use of rollator, using AD AAT   Patient agrees to discharge. Patient goals were partially met. Patient is being discharged due to meeting the stated rehab goals.  END OF SESSION:  PT End of Session - 08/14/22 1104     Visit Number 5    Number of Visits 5    Date for PT Re-Evaluation 08/15/22    Authorization Type Medicare    PT Start Time 1101    PT Stop Time 1116   discharge   PT Time Calculation (min) 15 min    Equipment Utilized During Treatment Gait belt    Activity Tolerance Patient tolerated treatment well    Behavior During Therapy WFL for tasks assessed/performed             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   Cerebellar ataxia ([place])    Dysesthesia 06/04/2016   Essential hypertension 11/05/2019   Foot pain, left 06/04/2016   Gait disturbance 03/16/2019   History of cervical spinal surgery 06/04/2016   History of hiatal hernia    History of kidney stones    Hypertension    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   Memory loss 03/16/2019   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   Pain due to onychomycosis of toenails of both feet 04/28/2019   Pain in left ankle and joints of left foot 08/06/2018   Slurred speech 03/16/2019   Superficial peroneal nerve neuropathy, 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   TIA (transient ischemic attack) 11/05/2019   Worsened handwriting 03/16/2019   Past Surgical History:  Procedure Laterality Date   [place] N/A  09/13/2020   Procedure: CARDIOVERSION;  Surgeon: Sanda Klein, MD;  Location: [place];  Service: Cardiovascular;  Laterality: N/A;   FRACTURE SURGERY     Left ankle, sx x4   INGUINAL HERNIA REPAIR Right 03/05/2019   Procedure: OPEN RIGHT INGUINAL HERNIA REPAIR WITH MESH;  Surgeon: Clovis Riley, MD;  Location: [place];  Service: General;  Laterality: Right;   [place] N/A 07/27/2020   Procedure: LEFT ATRIAL APPENDAGE OCCLUSION;  Surgeon: Sherren Mocha, MD;  Location: [place] [facility];  Service: Cardiovascular;  Laterality: N/A;   SHOULDER SURGERY Left    [place] WITHOUT CARDIOVERSION N/A 07/27/2020   Procedure: TRANSESOPHAGEAL ECHOCARDIOGRAM (TEE);  Surgeon: Sherren Mocha, MD;  Location: [place] [facility];  Service: Cardiovascular;  Laterality: N/A;   TE[place] WITHOUT CARDIOVERSION N/A 09/13/2020   Procedure: TRANSESOPHAGEAL ECHOCARDIOGRAM (TEE);  Surgeon: Sanda Klein, MD;  Location: [facility];  Service: Cardiovascular;  Laterality: N/A;   TONSILLECTOMY     1945   Patient Active Problem List   Diagnosis Date Noted   Pain in right shoulder 07/31/2021   Persistent atrial fibrillation ([place]) 08/28/2020   Secondary hypercoagulable state ([place]) 07/20/2020   Paroxysmal atrial fibrillation ([place]) 06/19/2020   TIA (transient ischemic attack) 11/05/2019   Essential hypertension 11/05/2019   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/05/2019  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   C6 radiculopathy 05/04/2019   Pain due to onychomycosis of toenails of both feet 04/28/2019   Worsened handwriting 03/16/2019   Gait disturbance 03/16/2019   Slurred speech 03/16/2019   Memory loss 03/16/2019   Pain in left ankle and joints of left foot 08/06/2018   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   Foot pain, left 06/04/2016   Dysesthesia 06/04/2016   Superficial peroneal nerve neuropathy, 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 Reports using AD AAT in the house. Denies falls/near falls.   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 NCV/EMG study did not show any evidence of motor neuron disease. We did the Athena SCA panel (30+ genes). It was borderline. 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 No  PRECAUTIONS: Fall  PATIENT GOALS: "to not get worse"  OBJECTIVE:   DIAGNOSTIC FINDINGS:  NCV/EMG study 05/04/2019 showed the following: 1.   Chronic right C6 radiculopathy.  There could also be milder C5 and C7 chronic radiculopathies. 2.   Chronic left S1 radiculopathy. 3.   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   There was no  evidence of polyneuropathy, motor neuron disease or myopathy.  [facility] PT Assessment - 08/14/22 0001       Standardized Balance Assessment   Five times sit to stand comments  12.47s no UE    10 Meter Walk .7m/s  Timed Up and Go Test   Normal TUG (seconds) 17.66             TODAY'S TREATMENT:          [facility] PT Assessment - 08/14/22 0001       Standardized Balance Assessment   Five times sit to stand comments  12.47s no UE    10 Meter Walk .26m/s      Timed Up and Go Test   Normal TUG (seconds) 17.66             PATIENT EDUCATION: Education details: PT POC, exam findings Person educated: Patient Education method: Explanation Education comprehension: verbalized understanding and needs further education  HOME EXERCISE PROGRAM: Access Code: [email or web address] URL: [email or web address] Date: 07/31/2022 Prepared by: Mickie Bail Plaster  Exercises - Sit to Stand Without Arm Support  - 1 x daily - 7 x weekly - 3 sets - 10 reps - Step sideways with arms reaching at counter   - 1 x daily - 7 x weekly - 3 sets - 10 reps - Standing Balance Activity: Plyometric Modified Lower Extremity Jumping Jack  - 1 x daily - 7 x weekly - 3 sets - 10 reps - [employer] on Counter  - 1 x daily - 7 x weekly - 3 sets - 10 reps - Forward Step Down with Heel Tap and Rail Support  - 1 x daily - 7 x weekly - 3 sets - 10 reps  GOALS: Goals reviewed with patient? Yes  SHORT TERM GOALS: = LTG based on POC length LONG TERM GOALS: Target date: 08/15/22  Pt will be independent with updated HEP for improved balance and functional strength  Baseline: to be updated; provided Goal status: MET  2.  Pt will improve TUG to </= 16 secs to demonstrated reduced fall risk  Baseline: 21.5s with rollator; 17.66s with rollator Goal status: NOT MET  3.  Pt will improve 5x STS to </= 13 sec to demo improved functional LE strength and balance   Baseline: 15.8s no UE; 12.47s no UE Goal status:  MET  4.  Pt will improve gait speed to >/= .64m/s to demonstrate improved community ambulation  Baseline: .41m/s; .57m/s Goal status: MET  ASSESSMENT:  CLINICAL IMPRESSION: Patient seen for skilled PT session with emphasis on goal assessment and dc. He met 3/4 LTG and made excellent progress toward remaining goal. Five times Sit to Stand Test (FTSS) Method: Use a straight back chair with a solid seat that is 17-18" high. Ask participant to sit on the chair with arms folded across their chest.   Instructions: "Stand up and sit down as quickly as possible 5 times, keeping your arms folded across your chest."   Measurement: Stop timing when the participant touches the chair in sitting the 5th time.  TIME: 12.47 sec  Cut off scores indicative of increased fall risk: >12 sec CVA, >16 sec PD, >13 sec vestibular (ANPTA Core Set of Outcome Measures for Adults with Neurologic Conditions, 2018). 10 Meter Walk Test: Patient instructed to walk 10 meters (32.8 ft) as quickly and as safely as possible at their normal speed x2 and at a fast speed x2. Time measured from 2 meter mark to 8 meter mark to accommodate ramp-up and ramp-down.  Normal speed: .94m/s Cut off scores: <0.4 m/s = household Ambulator, 0.4-0.8 m/s = limited community Ambulator, >0.8 m/s = community Ambulator, >1.2 m/s = crossing a street, <1.0 = increased fall risk MCID 0.05 m/s (small), 0.13 m/s (  moderate), 0.06 m/s (significant)  (ANPTA Core Set of Outcome Measures for Adults with Neurologic Conditions, 2018). Patient completed the Timed Up and Go test (TUG) in 17.66 seconds.  Geriatrics: need for further assessment of fall risk: ? 12 sec; Recurrent falls: > 15 sec; Vestibular Disorders fall risk: > 15 sec; Parkinson's Disease fall risk: > 16 sec ([email or web address], 2023). Patient to dc from PT at this time.       OBJECTIVE IMPAIRMENTS: Abnormal gait, decreased balance, decreased coordination, decreased knowledge of condition,  decreased knowledge of use of DME, decreased safety awareness, and impaired perceived functional ability.   ACTIVITY LIMITATIONS: carrying, squatting, stairs, transfers, locomotion level, and caring for others  PARTICIPATION LIMITATIONS: cleaning, driving, community activity, and yard work  PERSONAL FACTORS: Age, Behavior pattern, Past/current experiences, Time since onset of injury/illness/exacerbation, and 3+ comorbidities: C6 radiculopathy, HTN, lumbosacral radiculopathy  are also affecting patient's functional outcome.   REHAB POTENTIAL: Fair progressive dx  CLINICAL DECISION MAKING: Stable/uncomplicated  EVALUATION COMPLEXITY: Low  PLAN:  PT FREQUENCY: 1x/week  PT DURATION: 4 weeks  PLANNED INTERVENTIONS: Therapeutic exercises, Therapeutic activity, Neuromuscular re-education, Balance training, Gait training, Patient/Family education, Self Care, Joint mobilization, Stair training, Vestibular training, Visual/preceptual remediation/compensation, Orthotic/Fit training, DME instructions, Aquatic Therapy, Manual therapy, and Re-evaluation  PLAN FOR NEXT SESSION:  dc from PT   Debbora Dus, PT, DPT, CBIS 08/14/2022, 11:27 AM

## 2022-08-22 ENCOUNTER — Other Ambulatory Visit: Payer: Self-pay | Admitting: Cardiology

## 2022-08-27 DIAGNOSIS — H52203 Unspecified astigmatism, bilateral: Secondary | ICD-10-CM | POA: Diagnosis not present

## 2022-08-27 DIAGNOSIS — H524 Presbyopia: Secondary | ICD-10-CM | POA: Diagnosis not present

## 2022-08-27 DIAGNOSIS — H25013 Cortical age-related cataract, bilateral: Secondary | ICD-10-CM | POA: Diagnosis not present

## 2022-08-27 DIAGNOSIS — H43813 Vitreous degeneration, bilateral: Secondary | ICD-10-CM | POA: Diagnosis not present

## 2022-08-27 DIAGNOSIS — H2513 Age-related nuclear cataract, bilateral: Secondary | ICD-10-CM | POA: Diagnosis not present

## 2022-09-03 ENCOUNTER — Other Ambulatory Visit: Payer: Self-pay | Admitting: Internal Medicine

## 2022-09-26 ENCOUNTER — Telehealth: Payer: Self-pay | Admitting: Neurology

## 2022-09-26 NOTE — Telephone Encounter (Signed)
Pt's wife called stating that the pt is wanting to use the exercise machines at the senior center and they are needing a letter stating that provider approves of pt using the machinery. Please advise.

## 2022-09-27 NOTE — Telephone Encounter (Signed)
Left detailed message on cell per DPR access that Dr.Sater is out of the office and will return on Tuesday. We will get back in contact with pt once he returns.

## 2022-09-30 NOTE — Progress Notes (Unsigned)
  Electrophysiology Office Follow up Visit Note:    Date:  10/01/2022   ID:  Derrick White, DOB 1941-04-06, MRN [id number]  PCP:  Gaspar Garbe, MD  [facility] Cardiologist:  Dietrich Pates, MD  [facility] Electrophysiologist:  Lanier Prude, MD    Interval History:    Derrick White is a 82 y.o. male who presents for a follow up visit.   He has a history of atrial fibrillation, hypertension, TIA and had a watchman implanted in 2022.  The patient has amiodarone for rhythm control.  The patient last saw [name] in clinic on April 04, 2022.  He is on aspirin monotherapy.       Past medical, surgical, social and family history were reviewed.  ROS:   Please see the history of present illness.    All other systems reviewed and are negative.  EKGs/Labs/Other Studies Reviewed:    The following studies were reviewed today:  Last TSH 2.9 from May 2023  EKG:  The ekg ordered today demonstrates sinus rhythm with a first-degree AV delay   Physical Exam:    VS:  BP 116/68   Pulse 61   Resp 12   SpO2 98%     Wt Readings from Last 3 Encounters:  06/20/22 183 lb 9.6 oz (83.3 kg)  04/04/22 182 lb (82.6 kg)  02/06/22 181 lb 9.6 oz (82.4 kg)     GEN:  Well nourished, well developed in no acute distress CARDIAC: RRR, no murmurs, rubs, gallops RESPIRATORY:  Clear to auscultation without rales, wheezing or rhonchi       ASSESSMENT:    1. Persistent atrial fibrillation (HCC)   2. TIA (transient ischemic attack)   3. Encounter for long-term (current) use of high-risk medication   4. Presence of Watchman left atrial appendage closure device    PLAN:    In order of problems listed above:   #Persistent atrial fibrillation Maintaining sinus rhythm on amiodarone Repeat CMP, TSH and free T4  #Presence of Watchman device Continue aspirin 81 mg by mouth once daily  Follow-up 6 months with APP.      Signed, Steffanie Dunn, MD, [facility], [facility] 10/01/2022 10:35  AM    Electrophysiology [facility]

## 2022-10-01 ENCOUNTER — Encounter: Payer: Self-pay | Admitting: Cardiology

## 2022-10-01 ENCOUNTER — Encounter: Payer: Self-pay | Admitting: Neurology

## 2022-10-01 ENCOUNTER — Ambulatory Visit: Payer: Medicare Other | Attending: Cardiology | Admitting: Cardiology

## 2022-10-01 VITALS — BP 116/68 | HR 61 | Resp 12

## 2022-10-01 DIAGNOSIS — Z79899 Other long term (current) drug therapy: Secondary | ICD-10-CM | POA: Diagnosis not present

## 2022-10-01 DIAGNOSIS — G459 Transient cerebral ischemic attack, unspecified: Secondary | ICD-10-CM | POA: Diagnosis not present

## 2022-10-01 DIAGNOSIS — I4819 Other persistent atrial fibrillation: Secondary | ICD-10-CM | POA: Diagnosis not present

## 2022-10-01 DIAGNOSIS — Z95818 Presence of other cardiac implants and grafts: Secondary | ICD-10-CM | POA: Diagnosis not present

## 2022-10-01 NOTE — Telephone Encounter (Signed)
Dr.Sater please see original message below about the need for a letter to use exercise equipment.   Please advise

## 2022-10-01 NOTE — Progress Notes (Signed)
  Electrophysiology Office Follow up Visit Note:    Date:  10/01/2022   ID:  Derrick White, DOB April 25, 1941, MRN [id number]  PCP:  Gaspar Garbe, MD  [facility] Cardiologist:  Dietrich Pates, MD  [facility] Electrophysiologist:  Lanier Prude, MD    Interval History:    Derrick White is a 82 y.o. male who presents for a follow up visit.   He has a history of atrial fibrillation, hypertension, TIA, and had a watchman implanted in 2022.  The patient has amiodarone for rhythm control.  The patient last saw [name] in clinic on April 04, 2022.  He is on aspirin monotherapy.  Today, he states he is feeling great. He is monitoring his heart rhythm daily at home and denies noticing any arrhythmias.  He denies any palpitations, chest pain, shortness of breath, or peripheral edema. No lightheadedness, headaches, syncope, orthopnea, or PND.     Past medical, surgical, social and family history were reviewed.  ROS:   Please see the history of present illness.    All other systems reviewed and are negative.  EKGs/Labs/Other Studies Reviewed:    The following studies were reviewed today:  Last TSH 2.9 from May 2023  EKG:  The ekg ordered today demonstrates sinus rhythm with a first degree av delay.   Physical Exam:    VS:  BP 116/68   Pulse 61   Resp 12   SpO2 98%     Wt Readings from Last 3 Encounters:  06/20/22 183 lb 9.6 oz (83.3 kg)  04/04/22 182 lb (82.6 kg)  02/06/22 181 lb 9.6 oz (82.4 kg)     GEN:  Well nourished, well developed in no acute distress CARDIAC: RRR, no murmurs, rubs, gallops RESPIRATORY:  Clear to auscultation without rales, wheezing or rhonchi       ASSESSMENT:    1. Persistent atrial fibrillation (HCC)   2. TIA (transient ischemic attack)   3. Encounter for long-term (current) use of high-risk medication   4. Presence of Watchman left atrial appendage closure device    PLAN:    In order of problems listed  above:   #Persistent atrial fibrillation Maintaining sinus rhythm on amiodarone Repeat CMP, TSH and free T4  #Presence of Watchman device Continue aspirin 81 mg by mouth once daily  Follow-up 6 months with APP.   I,Mathew Stumpf,acting as a Neurosurgeon for Lanier Prude, MD.,have documented all relevant documentation on the behalf of Lanier Prude, MD,as directed by  Lanier Prude, MD while in the presence of Lanier Prude, MD.  I, Lanier Prude, MD, have reviewed all documentation for this visit. The documentation on 10/01/22 for the exam, diagnosis, procedures, and orders are all accurate and complete.   Signed, Steffanie Dunn, MD, [facility], [facility] 10/01/2022 10:36 AM    Electrophysiology [facility]

## 2022-10-01 NOTE — Patient Instructions (Signed)
Medication Instructions:  Your physician recommends that you continue on your current medications as directed. Please refer to the Current Medication list given to you today.  *If you need a refill on your cardiac medications before your next appointment, please call your pharmacy*  Lab Work: TODAY: CMET, TSH, T4 If you have labs (blood work) drawn today and your tests are completely normal, you will receive your results only by: MyChart Message (if you have MyChart) OR A paper copy in the mail If you have any lab test that is abnormal or we need to change your treatment, we will call you to review the results.  Follow-Up: At [facility], you and your health needs are our priority.  As part of our continuing mission to provide you with exceptional heart care, we have created designated Provider Care Teams.  These Care Teams include your primary Cardiologist (physician) and Advanced Practice Providers (APPs -  Physician Assistants and Nurse Practitioners) who all work together to provide you with the care you need, when you need it.  Your next appointment:   6 month(s)  Provider:   You will see one of the following Advanced Practice Providers on your designated Care Team:   Renee Ursuy, PA-C Michael "Andy" Tillery, PA-C Suzann Riddle, NP 

## 2022-10-02 ENCOUNTER — Other Ambulatory Visit (INDEPENDENT_AMBULATORY_CARE_PROVIDER_SITE_OTHER): Payer: Medicare Other

## 2022-10-02 DIAGNOSIS — I4819 Other persistent atrial fibrillation: Secondary | ICD-10-CM

## 2022-10-02 LAB — COMPREHENSIVE METABOLIC PANEL
ALT: 39 IU/L (ref 0–44)
AST: 22 IU/L (ref 0–40)
Albumin/Globulin Ratio: 1.5 (ref 1.2–2.2)
Albumin: 4.4 g/dL (ref 3.7–4.7)
Alkaline Phosphatase: 54 IU/L (ref 44–121)
BUN/Creatinine Ratio: 14 (ref 10–24)
BUN: 18 mg/dL (ref 8–27)
Bilirubin Total: 0.5 mg/dL (ref 0.0–1.2)
CO2: 24 mmol/L (ref 20–29)
Calcium: 9.8 mg/dL (ref 8.6–10.2)
Chloride: 101 mmol/L (ref 96–106)
Creatinine, Ser: 1.26 mg/dL (ref 0.76–1.27)
Globulin, Total: 2.9 g/dL (ref 1.5–4.5)
Glucose: 78 mg/dL (ref 70–99)
Potassium: 4.9 mmol/L (ref 3.5–5.2)
Sodium: 139 mmol/L (ref 134–144)
Total Protein: 7.3 g/dL (ref 6.0–8.5)
eGFR: 57 mL/min/{1.73_m2} — ABNORMAL LOW (ref >59)

## 2022-10-02 LAB — T4, FREE: Free T4: 1.77 ng/dL (ref 0.82–1.77)

## 2022-10-02 LAB — TSH: TSH: 3.79 u[IU]/mL (ref 0.450–4.500)

## 2022-10-02 NOTE — Telephone Encounter (Signed)
Called and spoke with patient wife Clydie Braun and she asked letter be mailed. Letter placed in mail.

## 2022-10-02 NOTE — Telephone Encounter (Signed)
Letter placed in Dr.Sater "sign basket" to be signed.

## 2022-10-08 DIAGNOSIS — G9050 Complex regional pain syndrome I, unspecified: Secondary | ICD-10-CM | POA: Diagnosis not present

## 2022-10-08 DIAGNOSIS — M5136 Other intervertebral disc degeneration, lumbar region: Secondary | ICD-10-CM | POA: Diagnosis not present

## 2022-10-08 DIAGNOSIS — G119 Hereditary ataxia, unspecified: Secondary | ICD-10-CM | POA: Diagnosis not present

## 2022-10-08 DIAGNOSIS — G5732 Lesion of lateral popliteal nerve, left lower limb: Secondary | ICD-10-CM | POA: Diagnosis not present

## 2022-10-08 DIAGNOSIS — K219 Gastro-esophageal reflux disease without esophagitis: Secondary | ICD-10-CM | POA: Diagnosis not present

## 2022-10-08 DIAGNOSIS — D6869 Other thrombophilia: Secondary | ICD-10-CM | POA: Diagnosis not present

## 2022-10-08 DIAGNOSIS — I4819 Other persistent atrial fibrillation: Secondary | ICD-10-CM | POA: Diagnosis not present

## 2022-10-08 DIAGNOSIS — R1312 Dysphagia, oropharyngeal phase: Secondary | ICD-10-CM | POA: Diagnosis not present

## 2022-10-08 DIAGNOSIS — G3184 Mild cognitive impairment, so stated: Secondary | ICD-10-CM | POA: Diagnosis not present

## 2022-10-08 DIAGNOSIS — M1990 Unspecified osteoarthritis, unspecified site: Secondary | ICD-10-CM | POA: Diagnosis not present

## 2022-10-08 DIAGNOSIS — N401 Enlarged prostate with lower urinary tract symptoms: Secondary | ICD-10-CM | POA: Diagnosis not present

## 2022-10-08 DIAGNOSIS — E7800 Pure hypercholesterolemia, unspecified: Secondary | ICD-10-CM | POA: Diagnosis not present

## 2022-11-28 ENCOUNTER — Other Ambulatory Visit: Payer: Self-pay | Admitting: Cardiology

## 2022-11-28 ENCOUNTER — Other Ambulatory Visit: Payer: Self-pay | Admitting: Internal Medicine

## 2022-12-19 DIAGNOSIS — M5136 Other intervertebral disc degeneration, lumbar region: Secondary | ICD-10-CM | POA: Diagnosis not present

## 2022-12-19 DIAGNOSIS — G8929 Other chronic pain: Secondary | ICD-10-CM | POA: Diagnosis not present

## 2022-12-19 DIAGNOSIS — M545 Low back pain, unspecified: Secondary | ICD-10-CM | POA: Diagnosis not present

## 2022-12-30 DIAGNOSIS — C44612 Basal cell carcinoma of skin of right upper limb, including shoulder: Secondary | ICD-10-CM | POA: Diagnosis not present

## 2022-12-30 DIAGNOSIS — L738 Other specified follicular disorders: Secondary | ICD-10-CM | POA: Diagnosis not present

## 2022-12-30 DIAGNOSIS — D485 Neoplasm of uncertain behavior of skin: Secondary | ICD-10-CM | POA: Diagnosis not present

## 2022-12-30 DIAGNOSIS — L570 Actinic keratosis: Secondary | ICD-10-CM | POA: Diagnosis not present

## 2022-12-30 DIAGNOSIS — Z85828 Personal history of other malignant neoplasm of skin: Secondary | ICD-10-CM | POA: Diagnosis not present

## 2023-01-21 NOTE — Progress Notes (Signed)
Cardiology Office Note   Date:  01/22/2023   ID:  Derrick White, DOB [birth year], MRN [id number]  PCP:  Gaspar Garbe, MD  Cardiologist:   Dietrich Pates, MD   Pt presents for f/u of paroxysmal afib  History of Present Illness: Derrick White is a 82 y.o. male with a history of PAF, HTN, HL, CV disease, CVA (2021)  Echo 2021 LVEF 60 to 65%  Pt found to have afib on an event monitor after CVA   The pt also has cerebellar ataxia  with falls    I saw him in Jan 2022  With the falls concern for use of anticoagulaton   I referred him to Jeanie Cooks for [place] placement  He had this done in March 2022   He underwet TEE cardioversion on 09/13/20.  Watchman noted to be well seated   No thrombus noted    Seen in clinic on 09/2020 Back in afib.  Started on Toprol XL 25   Pt felt very fatigued   Amiodarone started and he converted to SR    Eliquis stopped in July 2022 I saw the pt in May 2023  HE has seen C Lalla Brothers since   The pt denies palpitations No dizziness  No CP  NO SOB   Using a walker     Current Meds  Medication Sig   acetaminophen (TYLENOL) 500 MG tablet Take 500 mg by mouth every 6 (six) hours as needed for moderate pain or headache.   amiodarone (PACERONE) 200 MG tablet TAKE 1 TABLET BY MOUTH DAILY *MUST KEEP UPCOMING APPOINTMENT IN MAY 2024 FOR ANY MORE REFILLS*   amLODipine (NORVASC) 2.5 MG tablet TAKE 1 TABLET BY MOUTH TWICE A DAY   aspirin EC 81 MG tablet Take 1 tablet (81 mg total) by mouth daily. Swallow whole.   atorvastatin (LIPITOR) 40 MG tablet Take 1 tablet (40 mg total) by mouth daily.   Cholecalciferol 25 MCG (1000 UT) tablet Take 1,000 Units by mouth at bedtime.   diphenhydramine-acetaminophen (TYLENOL PM) 25-500 MG TABS tablet Take 1 tablet by mouth at bedtime as needed (sleep).   fluticasone (FLONASE) 50 MCG/ACT nasal spray Place 1 spray into both nostrils daily as needed for allergies or rhinitis.   neomycin-polymyxin b-dexamethasone (MAXITROL) 3.5-10000-0.1 SUSP  Place 1 drop into both eyes as needed.   pantoprazole (PROTONIX) 40 MG tablet Take 40 mg by mouth in the morning.   Probiotic Product (ALIGN) 4 MG CAPS Take by mouth in the morning.   Propylene Glycol (SYSTANE BALANCE) 0.6 % SOLN Place 1 drop into both eyes 2 (two) times daily as needed (burning).   vitamin B-12 (CYANOCOBALAMIN) 1000 MCG tablet Take 1,000 mcg by mouth at bedtime.   vitamin E 180 MG (400 UNITS) capsule Take 400 Units by mouth at bedtime.     Allergies:   Morphine, Codeine, Propoxyphene, and Shrimp [shellfish allergy]   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   Memory loss 03/16/2019   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   Pain due to onychomycosis of toenails of both feet 04/28/2019   Pain in left ankle  and joints of left foot 08/06/2018   Slurred speech 03/16/2019   Superficial peroneal nerve neuropathy, 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   TIA (transient ischemic attack) 11/05/2019   Worsened handwriting 03/16/2019    Past Surgical History:  Procedure Laterality Date   APPENDECTOMY     1940s   BACK SURGERY     1981, 1998   CARDIOVERSION N/A 09/13/2020   Procedure: CARDIOVERSION;  Surgeon: Thurmon Fair, MD;  Location: [facility];  Service: Cardiovascular;  Laterality: N/A;   FRACTURE SURGERY     Left ankle, sx x4   INGUINAL HERNIA REPAIR Right 03/05/2019   Procedure: OPEN RIGHT INGUINAL HERNIA REPAIR WITH MESH;  Surgeon: Berna Bue, MD;  Location: [facility] OR;  Service: General;  Laterality: Right;   KNEE SURGERY Left 1985   LEFT ATRIAL APPENDAGE OCCLUSION N/A 07/27/2020   Procedure: LEFT ATRIAL APPENDAGE OCCLUSION;  Surgeon: Tonny Bollman, MD;  Location[facility] INVASIVE CV LAB;  Service: Cardiovascular;  Laterality: N/A;   SHOULDER SURGERY Left    TEE WITHOUT CARDIOVERSION N/A 07/27/2020   Procedure: TRANSESOPHAGEAL ECHOCARDIOGRAM (TEE);  Surgeon: Tonny Bollman, MD;  Location: [facility] INVASIVE CV LAB;  Service: Cardiovascular;  Laterality: N/A;   TEE WITHOUT CARDIOVERSION N/A 09/13/2020   Procedure: TRANSESOPHAGEAL ECHOCARDIOGRAM (TEE);  Surgeon: Thurmon Fair, MD;  Location: [facility] ENDOSCOPY;  Service: Cardiovascular;  Laterality: N/A;   TONSILLECTOMY     1945     Social History:  The patient  reports that he quit smoking about 29 years ago. His smoking use included cigarettes. He started smoking about 59 years ago. He has a 30 pack-year smoking history. He has never used smokeless tobacco. He reports current alcohol use of about 16.0 standard drinks of alcohol per week. He reports that he does not use drugs.   Family History:  The patient's family history is not on file.    ROS:  Please see the history of present illness. All other systems are reviewed and  Negative to the above problem except as noted.    PHYSICAL EXAM: VS:  BP 132/60   Pulse (!) 53   Ht 5\' 10"  (1.778 m)   Wt 182 lb 12.8 oz (82.9 kg)   SpO2 97%   BMI 26.23 kg/m   GEN: Well nourished, well developed, in no acute distress  HEENT: normal  Neck: no JVD, no carotid bruit Cardiac:  Irreg irreg ; no murmurs. Triv LE edema  Respiratory:  clear to auscultation  GI: soft, nontender   No hepatomegaly    EKG:  EKG is not ordered today.    TEE  09/13/20  1. Left ventricular ejection fraction, by estimation, is 60 to 65%. The left ventricle has normal function. The left ventricle has no regional wall motion abnormalities. 2. Right ventricular systolic function is normal. The right ventricular size is normal. 3. Well seated Watchman device in the left atrial appendage, without leak.. Left atrial size was mildly dilated. No left atrial/left atrial appendage  thrombus was detected. 4. The mitral valve is normal in structure. No evidence of mitral valve regurgitation. 5. The aortic valve is tricuspid. Aortic valve regurgitation is trivial. No aortic stenosis is present. 6. Aortic dilatation noted. There is mild dilatation of the aortic root, measuring 39 mm. There is mild (Grade II) plaque involving the descending aorta. 7. Evidence of atrial level shunting detected by color flow Doppler (s/p transseptal puncture). 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  Lipid Panel    Component Value Date/Time   CHOL 128 01/07/2020 1124   TRIG 107 01/07/2020 1124   HDL 50 01/07/2020 1124   CHOLHDL 2.6 01/07/2020 1124   CHOLHDL 3.2 11/05/2019 0959   VLDL 32 11/05/2019 0959   LDLCALC 58 01/07/2020 1124      Wt Readings from Last 3 Encounters:  01/22/23 182 lb 12.8 oz (82.9 kg)  06/20/22 183 lb 9.6 oz (83.3 kg)  04/04/22 182 lb (82.6 kg)      ASSESSMENT AND PLAN:  1  Atrial fibrillation.   Pt  clinically remains in SR   Feels good   Continue current regimen   Will review with C Lambert dosing and also follow up    2 CVA  In June 2021 had  L temporal infarct   Now s/p Watchman  3  CAD   Ca sore was 3      Pt denies CP   Follow       3  Cerebellar ataxia   Using walker   4  HL  Will get lipomed     5   HTN  BP  under fair control   Current medicines are reviewed at length with the patient today.  The patient does not have concerns regarding medicines.  Signed, Dietrich Pates, MD  01/22/2023 6:45 PM    [facility] [address] [place], [place], [place]  [address] Phone: [phone]; Fax: [phone]

## 2023-01-22 ENCOUNTER — Encounter: Payer: Self-pay | Admitting: Internal Medicine

## 2023-01-22 ENCOUNTER — Ambulatory Visit: Payer: Medicare Other | Attending: Internal Medicine | Admitting: Internal Medicine

## 2023-01-22 VITALS — BP 132/60 | HR 53 | Ht 70.0 in | Wt 182.8 lb

## 2023-01-22 DIAGNOSIS — Z95818 Presence of other cardiac implants and grafts: Secondary | ICD-10-CM | POA: Insufficient documentation

## 2023-01-22 DIAGNOSIS — Z79899 Other long term (current) drug therapy: Secondary | ICD-10-CM | POA: Diagnosis not present

## 2023-01-22 DIAGNOSIS — I4819 Other persistent atrial fibrillation: Secondary | ICD-10-CM | POA: Insufficient documentation

## 2023-01-22 NOTE — Patient Instructions (Addendum)
Medication Instructions:   *If you need a refill on your cardiac medications before your next appointment, please call your pharmacy*   Lab Work: NMR AND BMET   If you have labs (blood work) drawn today and your tests are completely normal, you will receive your results only by: MyChart Message (if you have MyChart) OR A paper copy in the mail If you have any lab test that is abnormal or we need to change your treatment, we will call you to review the results.   Testing/Procedures:    Follow-Up: At [facility], you and your health needs are our priority.  As part of our continuing mission to provide you with exceptional heart care, we have created designated Provider Care Teams.  These Care Teams include your primary Cardiologist (physician) and Advanced Practice Providers (APPs -  Physician Assistants and Nurse Practitioners) who all work together to provide you with the care you need, when you need it.  We recommend signing up for the patient portal called "MyChart".  Sign up information is provided on this After Visit Summary.  MyChart is used to connect with patients for Virtual Visits (Telemedicine).  Patients are able to view lab/test results, encounter notes, upcoming appointments, etc.  Non-urgent messages can be sent to your provider as well.   To learn more about what you can do with MyChart, go to [email or web address].    Your next appointment:    One year with Dr Tenny Craw and f/u with Dr Lalla Brothers IN [place] 2025/ Morene Antu

## 2023-01-23 ENCOUNTER — Telehealth: Payer: Self-pay | Admitting: Internal Medicine

## 2023-01-23 LAB — NMR, LIPOPROFILE
Cholesterol, Total: 132 mg/dL (ref 100–199)
HDL Particle Number: 38.4 umol/L (ref >=30.5)
HDL-C: 59 mg/dL (ref >39)
LDL Particle Number: 697 nmol/L (ref <1000)
LDL Size: 20.9 nm (ref >20.5)
LDL-C (NIH Calc): 57 mg/dL (ref 0–99)
LP-IR Score: 40 (ref <=45)
Small LDL Particle Number: 426 nmol/L (ref <=527)
Triglycerides: 85 mg/dL (ref 0–149)

## 2023-01-23 LAB — BASIC METABOLIC PANEL
BUN/Creatinine Ratio: 14 (ref 10–24)
BUN: 14 mg/dL (ref 8–27)
CO2: 20 mmol/L (ref 20–29)
Calcium: 9.7 mg/dL (ref 8.6–10.2)
Chloride: 104 mmol/L (ref 96–106)
Creatinine, Ser: 0.97 mg/dL (ref 0.76–1.27)
Glucose: 92 mg/dL (ref 70–99)
Potassium: 5.3 mmol/L — ABNORMAL HIGH (ref 3.5–5.2)
Sodium: 143 mmol/L (ref 134–144)
eGFR: 78 mL/min/{1.73_m2} (ref >59)

## 2023-01-23 NOTE — Telephone Encounter (Signed)
Pt's wife calling to see if Dr.Ross will give the pt a referral over to the physical therapist. Please advise.

## 2023-01-23 NOTE — Telephone Encounter (Signed)
Pts wife Darl Pikes is calling to ask Dr. Tenny Craw if she thinks the pt should be referred to see PT for deconditioning/gait issues.  Wife states this was briefly talked about in the visit yesterday, but no definitive answer if he should be referred for PT services or not.    Wife is asking if Dr. Tenny Craw will refer the pt to PT and if so, which PT facility would she advise him to go to.   Wife is aware that Dr. Tenny Craw and Dewayne Hatch RN are out of the office today, but I will route this message to them to further review and advise on, upon return to the office. Wife is aware she will hear from them accordingly thereafter.  Wife verbalized understanding and agrees with this plan.

## 2023-01-24 DIAGNOSIS — Z23 Encounter for immunization: Secondary | ICD-10-CM | POA: Diagnosis not present

## 2023-01-25 NOTE — Telephone Encounter (Signed)
Refer for PT at [facility]    Can then continue with training after  PT finished

## 2023-01-27 ENCOUNTER — Other Ambulatory Visit: Payer: Self-pay | Admitting: Neurology

## 2023-01-27 ENCOUNTER — Telehealth: Payer: Self-pay | Admitting: Neurology

## 2023-01-27 DIAGNOSIS — G119 Hereditary ataxia, unspecified: Secondary | ICD-10-CM

## 2023-01-27 DIAGNOSIS — R269 Unspecified abnormalities of gait and mobility: Secondary | ICD-10-CM

## 2023-01-27 NOTE — Telephone Encounter (Signed)
Phone room: Please let patient know that Dr. Epimenio Foot stated he will place some orders.  Thanks,  Production assistant, radio

## 2023-01-27 NOTE — Telephone Encounter (Signed)
Wife is asking for a call to discuss if Dr Epimenio Foot will put orders in for PT, please call.

## 2023-01-27 NOTE — Telephone Encounter (Signed)
Pt's wife has been informed and she was given number and stated she will call to schedule and verbalized appreciation.

## 2023-01-27 NOTE — Telephone Encounter (Signed)
Unable to reach pts wife... no answer x2.

## 2023-01-27 NOTE — Telephone Encounter (Signed)
Dr. Epimenio Foot,  I see where jennifer novak put some in for pt but I see none in from you Thanks,  Shawni Volkov

## 2023-01-28 ENCOUNTER — Telehealth: Payer: Self-pay

## 2023-01-28 DIAGNOSIS — G119 Hereditary ataxia, unspecified: Secondary | ICD-10-CM

## 2023-01-28 DIAGNOSIS — I4819 Other persistent atrial fibrillation: Secondary | ICD-10-CM

## 2023-01-28 DIAGNOSIS — M5412 Radiculopathy, cervical region: Secondary | ICD-10-CM

## 2023-01-28 DIAGNOSIS — Z9889 Other specified postprocedural states: Secondary | ICD-10-CM

## 2023-01-28 DIAGNOSIS — E875 Hyperkalemia: Secondary | ICD-10-CM

## 2023-01-28 DIAGNOSIS — Z95818 Presence of other cardiac implants and grafts: Secondary | ICD-10-CM

## 2023-01-28 DIAGNOSIS — M5417 Radiculopathy, lumbosacral region: Secondary | ICD-10-CM

## 2023-01-28 DIAGNOSIS — Z79899 Other long term (current) drug therapy: Secondary | ICD-10-CM

## 2023-01-28 NOTE — Telephone Encounter (Signed)
-----   Message from [facility] sent at 01/25/2023  6:16 PM EDT ----- Lipids are excellent  Kidney function is good Potassium is slightly elevated   5.3   Is he taking any K supplement? Stay hydrated

## 2023-01-28 NOTE — Telephone Encounter (Signed)
I spoke with the pt/ wife and order placed for PT at [facility].

## 2023-01-28 NOTE — Telephone Encounter (Signed)
Check BMET in 1 month

## 2023-01-28 NOTE — Telephone Encounter (Signed)
Pts wife reports that he is not taking anything that is not on his med list... he is not taking any OTC meds or supplements.   Pts wife says his sister has h/o higher K levels and not sure if related.   Will forward to Dr Tenny Craw for any further recommendations.

## 2023-01-30 ENCOUNTER — Ambulatory Visit: Payer: Medicare Other | Attending: Internal Medicine

## 2023-01-30 DIAGNOSIS — Z9181 History of falling: Secondary | ICD-10-CM | POA: Diagnosis not present

## 2023-01-30 DIAGNOSIS — R2681 Unsteadiness on feet: Secondary | ICD-10-CM | POA: Diagnosis not present

## 2023-01-30 DIAGNOSIS — G119 Hereditary ataxia, unspecified: Secondary | ICD-10-CM | POA: Insufficient documentation

## 2023-01-30 DIAGNOSIS — M6281 Muscle weakness (generalized): Secondary | ICD-10-CM | POA: Insufficient documentation

## 2023-01-30 DIAGNOSIS — Z9889 Other specified postprocedural states: Secondary | ICD-10-CM | POA: Insufficient documentation

## 2023-01-30 DIAGNOSIS — R260 Ataxic gait: Secondary | ICD-10-CM | POA: Diagnosis not present

## 2023-01-30 DIAGNOSIS — M5417 Radiculopathy, lumbosacral region: Secondary | ICD-10-CM | POA: Insufficient documentation

## 2023-01-30 DIAGNOSIS — M5412 Radiculopathy, cervical region: Secondary | ICD-10-CM | POA: Diagnosis not present

## 2023-01-30 NOTE — Therapy (Unsigned)
OUTPATIENT PHYSICAL THERAPY NEURO EVALUATION   Patient Name: Derrick White MRN: [id number] DOB:31-Aug-1940, 82 y.o., male 9 Date: 01/31/2023   PCP: Gaspar Garbe, MD REFERRING PROVIDER: Pricilla Riffle, MD//Sater, Richard, MD  END OF SESSION:  PT End of Session - 01/30/23 1533     Visit Number 1    Number of Visits 6    Date for PT Re-Evaluation 03/13/23    Authorization Type Medicare    Progress Note Due on Visit 10    PT Start Time 1532    PT Stop Time 1615    PT Time Calculation (min) 43 min             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   Memory loss 03/16/2019   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   Pain due to onychomycosis of toenails of both feet 04/28/2019   Pain in left ankle and joints of left foot 08/06/2018   Slurred speech 03/16/2019   Superficial peroneal nerve neuropathy, 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   TIA (transient ischemic attack) 11/05/2019   Worsened handwriting 03/16/2019   Past Surgical History:  Procedure Laterality Date   APPENDECTOMY     1940s   BACK SURGERY     1981, 1998   CARDIOVERSION N/A 09/13/2020   Procedure: CARDIOVERSION;  Surgeon: Thurmon Fair, MD;  Location: [facility] ENDOSCOPY;  Service: Cardiovascular;  Laterality: N/A;   FRACTURE SURGERY     Left ankle, sx x4   INGUINAL HERNIA REPAIR Right 03/05/2019   Procedure: OPEN RIGHT INGUINAL HERNIA REPAIR WITH MESH;  Surgeon: Berna Bue, MD;  Location: [facility] OR;  Service: General;  Laterality: Right;   KNEE SURGERY Left 1985   LEFT ATRIAL APPENDAGE OCCLUSION N/A 07/27/2020   Procedure: LEFT ATRIAL APPENDAGE OCCLUSION;  Surgeon: Tonny Bollman,  MD;  Location: [facility] INVASIVE CV LAB;  Service: Cardiovascular;  Laterality: N/A;   SHOULDER SURGERY Left    TEE WITHOUT CARDIOVERSION N/A 07/27/2020   Procedure: TRANSESOPHAGEAL ECHOCARDIOGRAM (TEE);  Surgeon: Tonny Bollman, MD;  Location: [facility] INVASIVE CV LAB;  Service: Cardiovascular;  Laterality: N/A;   TEE WITHOUT CARDIOVERSION N/A 09/13/2020   Procedure: TRANSESOPHAGEAL ECHOCARDIOGRAM (TEE);  Surgeon: Thurmon Fair, MD;  Location: [facility] ENDOSCOPY;  Service: Cardiovascular;  Laterality: N/A;   TONSILLECTOMY     1945   Patient Active Problem List   Diagnosis Date Noted   Pain in right shoulder 07/31/2021   Persistent atrial fibrillation (HCC) 08/28/2020   Secondary hypercoagulable state (HCC) 07/20/2020   Paroxysmal atrial fibrillation (HCC) 06/19/2020   TIA (transient ischemic attack) 11/05/2019   Essential hypertension 11/05/2019   Cerebellar ataxia (HCC) 11/05/2019   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   C6 radiculopathy 05/04/2019   Pain due to onychomycosis of toenails of both feet 04/28/2019   Worsened handwriting 03/16/2019   Gait disturbance 03/16/2019   Slurred speech 03/16/2019   Memory loss 03/16/2019   Pain in left ankle and joints of left foot 08/06/2018   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   Foot pain, left 06/04/2016   Dysesthesia 06/04/2016   Superficial peroneal nerve neuropathy, 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   History of cervical spinal  surgery 06/04/2016    ONSET DATE: 4 years   REFERRING DIAG: G11.9 (ICD-10-CM) - Cerebellar ataxia (HCC) M54.17 (ICD-10-CM) - 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 Reports worsening balance as of late. Reports a couple of falls past 6 months.  Uses 4WW in community and cane/furniture walking inside home. Reports feeling that he has gotten weaker. Denies visual abnormalities/distortion Pt accompanied by: self  PERTINENT HISTORY: history of PAF, HTN, HL, CV disease, CVA (2021)  Echo 2021 LVEF 60 to 65%  Pt found to have afib on an event monitor after CVA   The pt also has cerebellar ataxia  with falls     PAIN:  Are you having pain? No  PRECAUTIONS: Fall  RED FLAGS: None   WEIGHT BEARING RESTRICTIONS: No  FALLS: Has patient fallen in last 6 months? Yes. Number of falls 2  LIVING ENVIRONMENT: Lives with: lives with their spouse Lives in: House/apartment Stairs: Yes: Internal: 2-story home, does not use steps; bilateral but cannot reach both and External: 2-6 steps; bilateral but cannot reach both Has following equipment at home: Single point cane and Walker - 4 wheeled  PLOF: Independent with basic ADLs, Independent with community mobility with device, and still driving  PATIENT GOALS: Going to [place] in the winter and want to be more mobile. "I don't want it to get worse"  OBJECTIVE:   DIAGNOSTIC FINDINGS: recent Neuro MD appointment. No imaging reported  COGNITION: Overall cognitive status: Within functional limits for tasks assessed   SENSATION: Not tested  COORDINATION: Dysdiadochokinesia Finger to nose moderately impaired Dysmetria present   EDEMA:  none  MUSCLE TONE: NT    POSTURE: No Significant postural limitations  LOWER EXTREMITY ROM:     WFL/WNL  LOWER EXTREMITY MMT:    BLE strength grossly 4/5  BED MOBILITY:  indep  TRANSFERS: Assistive device utilized: None  Sit to stand: Modified independence Stand to sit: Modified independence Chair to chair: Modified independence Floor:   NT    CURB:  Level of Assistance: SBA and CGA Assistive device utilized: Environmental consultant - 4 wheeled Curb Comments:   STAIRS: TBD  GAIT: Gait  pattern: decreased stride length and trendelenburg-RLE Distance walked:  Assistive device utilized: Environmental consultant - 4 wheeled and None Level of assistance: Modified independence Comments:   FUNCTIONAL TESTS:  5 times sit to stand: 19 sec Timed up and go (TUG): 22.78 sec w/ 6EA Berg Balance Scale: 43/56 +Romberg Gait speed: with walker 1.64 ft/sec; without walker 1.2 ft/sec     PATIENT EDUCATION: Education details: assessment details, rationale of PT intervention Person educated: Patient Education method: Explanation Education comprehension: verbalized understanding  HOME EXERCISE PROGRAM: TBD  GOALS: Goals reviewed with patient? Yes  SHORT TERM GOALS: Target date: 02/21/2023    Patient will be independent in HEP to improve functional outcomes Baseline: Goal status: INITIAL  2.  Manifest improved BLE strength and balance per time 15 sec 5xSTS test Baseline: 19 sec Goal status: INITIAL  3.  *** Baseline:  Goal status: INITIAL  4.  *** Baseline:  Goal status: INITIAL  5.  *** Baseline:  Goal status: INITIAL  6.  *** Baseline:  Goal status: INITIAL  LONG TERM GOALS: Target date: ***  *** Baseline:  Goal status: INITIAL  2.  *** Baseline:  Goal status: INITIAL  3.  *** Baseline:  Goal status: INITIAL  4.  *** Baseline:  Goal status: INITIAL  5.  *** Baseline:  Goal status: INITIAL  6.  *** Baseline:  Goal status: INITIAL  ASSESSMENT:  CLINICAL IMPRESSION: Patient is a *** y.o. *** who was seen today for physical therapy evaluation and treatment for ***.   OBJECTIVE IMPAIRMENTS: {opptimpairments:25111}.   ACTIVITY LIMITATIONS: {activitylimitations:27494}  PARTICIPATION LIMITATIONS: {participationrestrictions:25113}  PERSONAL FACTORS: {Personal factors:25162} are also affecting patient's  functional outcome.   REHAB POTENTIAL: {rehabpotential:25112}  CLINICAL DECISION MAKING: {clinical decision making:25114}  EVALUATION COMPLEXITY: {Evaluation complexity:25115}  PLAN:  PT FREQUENCY: {rehab frequency:25116}  PT DURATION: {rehab duration:25117}  PLANNED INTERVENTIONS: {rehab planned interventions:25118::"Therapeutic exercises","Therapeutic activity","Neuromuscular re-education","Balance training","Gait training","Patient/Family education","Self Care","Joint mobilization"}  PLAN FOR NEXT SESSION: ***   Dion Body, PT 01/31/2023, 7:51 AM

## 2023-01-31 ENCOUNTER — Other Ambulatory Visit: Payer: Self-pay

## 2023-02-03 ENCOUNTER — Encounter: Payer: Self-pay | Admitting: Physical Therapy

## 2023-02-03 ENCOUNTER — Ambulatory Visit: Payer: Medicare Other | Admitting: Physical Therapy

## 2023-02-03 DIAGNOSIS — R260 Ataxic gait: Secondary | ICD-10-CM

## 2023-02-03 DIAGNOSIS — M6281 Muscle weakness (generalized): Secondary | ICD-10-CM | POA: Diagnosis not present

## 2023-02-03 DIAGNOSIS — Z9181 History of falling: Secondary | ICD-10-CM | POA: Diagnosis not present

## 2023-02-03 DIAGNOSIS — G119 Hereditary ataxia, unspecified: Secondary | ICD-10-CM | POA: Diagnosis not present

## 2023-02-03 DIAGNOSIS — R2681 Unsteadiness on feet: Secondary | ICD-10-CM

## 2023-02-03 DIAGNOSIS — M5417 Radiculopathy, lumbosacral region: Secondary | ICD-10-CM | POA: Diagnosis not present

## 2023-02-03 NOTE — Therapy (Signed)
OUTPATIENT PHYSICAL THERAPY NEURO TREATMENT   Patient Name: Derrick White MRN: [id number] DOB:1941-04-21, 82 y.o., male 50 Date: 02/03/2023   PCP: Gaspar Garbe, MD REFERRING PROVIDER: Pricilla Riffle, MD//Sater, Richard, MD  END OF SESSION:  PT End of Session - 02/03/23 1520     Visit Number 2    Number of Visits 6    Date for PT Re-Evaluation 03/13/23    Authorization Type Medicare    Progress Note Due on Visit 10    PT Start Time 1437    PT Stop Time 1519    PT Time Calculation (min) 42 min    Equipment Utilized During Treatment Gait belt    Activity Tolerance Patient tolerated treatment well    Behavior During Therapy [facility] for tasks assessed/performed;Impulsive              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   Memory loss 03/16/2019   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   Pain due to onychomycosis of toenails of both feet 04/28/2019   Pain in left ankle and joints of left foot 08/06/2018   Slurred speech 03/16/2019   Superficial peroneal nerve neuropathy, 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   TIA (transient ischemic attack) 11/05/2019   Worsened handwriting 03/16/2019   Past Surgical History:  Procedure Laterality Date   APPENDECTOMY     1940s   BACK SURGERY     1981, 1998   CARDIOVERSION N/A 09/13/2020   Procedure: CARDIOVERSION;  Surgeon: Thurmon Fair, MD;  Location: [facility] ENDOSCOPY;  Service: Cardiovascular;  Laterality: N/A;   FRACTURE SURGERY     Left ankle, sx x4   INGUINAL HERNIA REPAIR Right 03/05/2019   Procedure: OPEN RIGHT INGUINAL HERNIA REPAIR WITH MESH;  Surgeon: Berna Bue, MD;  Location: [facility] OR;  Service:  General;  Laterality: Right;   KNEE SURGERY Left 1985   LEFT ATRIAL APPENDAGE OCCLUSION N/A 07/27/2020   Procedure: LEFT ATRIAL APPENDAGE OCCLUSION;  Surgeon: Tonny Bollman, MD;  Location: [facility] INVASIVE CV LAB;  Service: Cardiovascular;  Laterality: N/A;   SHOULDER SURGERY Left    TEE WITHOUT CARDIOVERSION N/A 07/27/2020   Procedure: TRANSESOPHAGEAL ECHOCARDIOGRAM (TEE);  Surgeon: Tonny Bollman, MD;  Location: [facility] INVASIVE CV LAB;  Service: Cardiovascular;  Laterality: N/A;   TEE WITHOUT CARDIOVERSION N/A 09/13/2020   Procedure: TRANSESOPHAGEAL ECHOCARDIOGRAM (TEE);  Surgeon: Thurmon Fair, MD;  Location: [facility] ENDOSCOPY;  Service: Cardiovascular;  Laterality: N/A;   TONSILLECTOMY     1945   Patient Active Problem List   Diagnosis Date Noted   Pain in right shoulder 07/31/2021   Persistent atrial fibrillation (HCC) 08/28/2020   Secondary hypercoagulable state (HCC) 07/20/2020   Paroxysmal atrial fibrillation (HCC) 06/19/2020   TIA (transient ischemic attack) 11/05/2019   Essential hypertension 11/05/2019   Cerebellar ataxia (HCC) 11/05/2019   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   C6 radiculopathy 05/04/2019   Pain due to onychomycosis of toenails of both feet 04/28/2019   Worsened handwriting 03/16/2019   Gait disturbance 03/16/2019   Slurred speech 03/16/2019   Memory loss 03/16/2019   Pain in left ankle and joints of left foot 08/06/2018  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   Foot pain, left 06/04/2016   Dysesthesia 06/04/2016   Superficial peroneal nerve neuropathy, 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   History of cervical spinal surgery 06/04/2016    ONSET DATE: 4 years   REFERRING DIAG: G11.9 (ICD-10-CM) - Cerebellar ataxia (HCC) M54.17 (ICD-10-CM) - 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 M54.12 (ICD-10-CM) - C6 radiculopathy Z98.890 (ICD-10-CM) - History of cervical spinal surgery  THERAPY DIAG:  Unsteadiness on feet  Ataxic gait  History of falling  Rationale for Evaluation and  Treatment: Rehabilitation  SUBJECTIVE:                                                                                                                                                                                             SUBJECTIVE STATEMENT: Doing good. No falls.  Pt accompanied by: self  PERTINENT HISTORY: history of PAF, HTN, HL, CV disease, CVA (2021)  Echo 2021 LVEF 60 to 65%  Pt found to have afib on an event monitor after CVA   The pt also has cerebellar ataxia  with falls     PAIN:  Are you having pain? No  PRECAUTIONS: Fall  RED FLAGS: None   WEIGHT BEARING RESTRICTIONS: No  FALLS: Has patient fallen in last 6 months? Yes. Number of falls 2  LIVING ENVIRONMENT: Lives with: lives with their spouse Lives in: House/apartment Stairs: Yes: Internal: 2-story home, does not use steps; bilateral but cannot reach both and External: 2-6 steps; bilateral but cannot reach both Has following equipment at home: Single point cane and Walker - 4 wheeled  PLOF: Independent with basic ADLs, Independent with community mobility with device, and still driving  PATIENT GOALS: Going to [place] in the winter and want to be more mobile. "I don't want it to get worse"  OBJECTIVE:      TODAY'S TREATMENT: 02/03/23 Activity Comments  STS EO/EC 2x15 Tendency to lift toes up and lean back onto heels; cues to increase wt shift, pushing belly button forward; CGA-min A  Standing ant/pos wt shifts Cues to avoid trunk flexion; more difficulty controlling anterior sway  Romberg EC  then + head turns/nods  Cues to push belly forward to increase anterior sway d/t tendency for LOB posteriorly   Multidirectional stepping to targets 1 UE support; cueing to shift onto standing LE   Alt toe taps on 6" step 3x10 Weaning to no UE support   Alt step ups  B UE support; cues for "belly forward" to redirect retropulsion        HOME EXERCISE PROGRAM Last updated: 02/03/23 Access Code: [email or web address] URL:  [email or web address] Date: 02/03/2023 Prepared by: [facility] -  Outpatient  Rehab - [facility]  Exercises - Sit to Stand with Counter Support  - 1 x daily - 5 x weekly - 2 sets - 10 reps - Romberg Stance with Eyes Closed  - 1 x daily - 5 x weekly - 2 sets - 30 sec hold - Standing Anterior Posterior Weight Shift with Chair  - 1 x daily - 5 x weekly - 2 sets - 10 reps   PATIENT EDUCATION: Education details: HEP- to be performed at counter or corner for safety Person educated: Patient Education method: Explanation, Demonstration, Tactile cues, Verbal cues, and Handouts Education comprehension: verbalized understanding and returned demonstration    Below measures were taken at time of initial evaluation unless otherwise specified:   DIAGNOSTIC FINDINGS: recent Neuro MD appointment. No imaging reported  COGNITION: Overall cognitive status: Within functional limits for tasks assessed   SENSATION: Not tested  COORDINATION: Dysdiadochokinesia Finger to nose moderately impaired Dysmetria present   EDEMA:  none  MUSCLE TONE: NT    POSTURE: No Significant postural limitations  LOWER EXTREMITY ROM:     WFL/WNL  LOWER EXTREMITY MMT:    BLE strength grossly 4/5  BED MOBILITY:  indep  TRANSFERS: Assistive device utilized: None  Sit to stand: Modified independence Stand to sit: Modified independence Chair to chair: Modified independence Floor:  NT    CURB:  Level of Assistance: SBA and CGA Assistive device utilized: Environmental consultant - 4 wheeled Curb Comments:   STAIRS: TBD  GAIT: Gait pattern: decreased stride length and trendelenburg-RLE Distance walked:  Assistive device utilized: Environmental consultant - 4 wheeled and None Level of assistance: Modified independence Comments:   FUNCTIONAL TESTS:  5 times sit to stand: 19 sec Timed up and go (TUG): 22.78 sec w/ 4ON Berg Balance Scale: 43/56 +Romberg Gait speed: with walker 1.64 ft/sec; without walker 1.2  ft/sec     PATIENT EDUCATION: Education details: assessment details, rationale of PT intervention Person educated: Patient Education method: Explanation Education comprehension: verbalized understanding  HOME EXERCISE PROGRAM: TBD  GOALS: Goals reviewed with patient? Yes  SHORT TERM GOALS: Target date: 02/21/2023    Patient will be independent in HEP to improve functional outcomes Baseline: Goal status: IN PROGRESS  2.  Manifest improved BLE strength and balance per time 15 sec 5xSTS test Baseline: 19 sec Goal status: IN PROGRESS    LONG TERM GOALS: Target date: 03/13/23  Demo reduced risk for falls per time 12 sec TUG test Baseline: 22 sec w/ 6EX Goal status: IN PROGRESS  2.  Demo improved balance and reduced risk for falls per score 50/56 Berg Balance Test Baseline: 43/56 Goal status: IN PROGRESS  3.  Demo improved gait speed to 2.0 ft/sec to improve efficiency in community ambulation Baseline: 1.2-1.64 ft/sec Goal status: IN PROGRESS    ASSESSMENT:  CLINICAL IMPRESSION: Patient arrived to session without complaints. STS transfers required cues for max control and correction of retropulsion as patient has tendency to stand with excessive weight through heels. Also requires cues for safety and impulsivity, such as safe placement of walker/use of walker for ambulation. Heavy use of verbal cueing was required to correct posterior sway with most activities. HEP was initially with thorough edu on max safety with these at home. Patient reported understanding and without complaints upon leaving.   OBJECTIVE IMPAIRMENTS: Abnormal gait, decreased activity tolerance, decreased balance, decreased coordination, decreased endurance, difficulty walking, decreased strength, and impaired tone.   ACTIVITY LIMITATIONS: carrying, stairs, transfers, reach over head, and locomotion level  PARTICIPATION LIMITATIONS: meal prep, cleaning, interpersonal relationship, shopping,  community activity, and yard work  PERSONAL FACTORS: Age, Time since onset of injury/illness/exacerbation, and 1-2 comorbidities: PMH  are also affecting patient's functional outcome.   REHAB POTENTIAL: Good  CLINICAL DECISION MAKING: Evolving/moderate complexity  EVALUATION COMPLEXITY: Moderate  PLAN:  PT FREQUENCY: 1x/week  PT DURATION: 6 weeks  PLANNED INTERVENTIONS: Therapeutic exercises, Therapeutic activity, Neuromuscular re-education, Balance training, Gait training, Patient/Family education, Self Care, Joint mobilization, Stair training, Vestibular training, Canalith repositioning, DME instructions, Aquatic Therapy, Dry Needling, Electrical stimulation, Taping, and Manual therapy  PLAN FOR NEXT SESSION: review HEP; work on controlling retropulsion     Anette Guarneri, PT, DPT 02/03/23 3:23 PM  [facility] at [facility] [address] [place], [place] [address] Phone # [phone] Fax # [phone]

## 2023-02-03 NOTE — Telephone Encounter (Signed)
Left a message for the pt/ wife to call me back

## 2023-02-06 NOTE — Telephone Encounter (Signed)
Pt to have repeat lab 02/28/23.

## 2023-02-10 ENCOUNTER — Encounter: Payer: Self-pay | Admitting: Physical Therapy

## 2023-02-10 ENCOUNTER — Ambulatory Visit: Payer: Medicare Other | Admitting: Physical Therapy

## 2023-02-10 DIAGNOSIS — M6281 Muscle weakness (generalized): Secondary | ICD-10-CM | POA: Diagnosis not present

## 2023-02-10 DIAGNOSIS — R2681 Unsteadiness on feet: Secondary | ICD-10-CM

## 2023-02-10 DIAGNOSIS — R260 Ataxic gait: Secondary | ICD-10-CM

## 2023-02-10 DIAGNOSIS — Z9181 History of falling: Secondary | ICD-10-CM | POA: Diagnosis not present

## 2023-02-10 DIAGNOSIS — M5417 Radiculopathy, lumbosacral region: Secondary | ICD-10-CM | POA: Diagnosis not present

## 2023-02-10 DIAGNOSIS — G119 Hereditary ataxia, unspecified: Secondary | ICD-10-CM | POA: Diagnosis not present

## 2023-02-10 NOTE — Therapy (Signed)
OUTPATIENT PHYSICAL THERAPY NEURO TREATMENT   Patient Name: Derrick White MRN: [id number] DOB:[birth year], 82 y.o., male 67 Date: 02/10/2023   PCP: Gaspar Garbe, MD REFERRING PROVIDER: Pricilla Riffle, MD//Sater, Richard, MD  END OF SESSION:  PT End of Session - 02/10/23 1227     Visit Number 3    Number of Visits 6    Date for PT Re-Evaluation 03/13/23    Authorization Type Medicare    Progress Note Due on Visit 10    PT Start Time 1230    PT Stop Time 1311    PT Time Calculation (min) 41 min    Equipment Utilized During Treatment Gait belt    Activity Tolerance Patient tolerated treatment well    Behavior During Therapy [facility] for tasks assessed/performed;Impulsive               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   Memory loss 03/16/2019   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   Pain due to onychomycosis of toenails of both feet 04/28/2019   Pain in left ankle and joints of left foot 08/06/2018   Slurred speech 03/16/2019   Superficial peroneal nerve neuropathy, 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   TIA (transient ischemic attack) 11/05/2019   Worsened handwriting 03/16/2019   Past Surgical History:  Procedure Laterality Date   APPENDECTOMY     1940s   BACK SURGERY     1981, 1998   CARDIOVERSION N/A 09/13/2020   Procedure: CARDIOVERSION;  Surgeon: Thurmon Fair, MD;  Location: [facility] ENDOSCOPY;  Service: Cardiovascular;  Laterality: N/A;   FRACTURE SURGERY     Left ankle, sx x4   INGUINAL HERNIA REPAIR Right 03/05/2019   Procedure: OPEN RIGHT INGUINAL HERNIA REPAIR WITH MESH;  Surgeon: Berna Bue, MD;  Location: [facility] OR;  Service:  General;  Laterality: Right;   KNEE SURGERY Left 1985   LEFT ATRIAL APPENDAGE OCCLUSION N/A 07/27/2020   Procedure: LEFT ATRIAL APPENDAGE OCCLUSION;  Surgeon: Tonny Bollman, MD;  Location: [facility] INVASIVE CV LAB;  Service: Cardiovascular;  Laterality: N/A;   SHOULDER SURGERY Left    TEE WITHOUT CARDIOVERSION N/A 07/27/2020   Procedure: TRANSESOPHAGEAL ECHOCARDIOGRAM (TEE);  Surgeon: Tonny Bollman, MD;  Location: [facility] INVASIVE CV LAB;  Service: Cardiovascular;  Laterality: N/A;   TEE WITHOUT CARDIOVERSION N/A 09/13/2020   Procedure: TRANSESOPHAGEAL ECHOCARDIOGRAM (TEE);  Surgeon: Thurmon Fair, MD;  Location: [facility] ENDOSCOPY;  Service: Cardiovascular;  Laterality: N/A;   TONSILLECTOMY     1945   Patient Active Problem List   Diagnosis Date Noted   Pain in right shoulder 07/31/2021   Persistent atrial fibrillation (HCC) 08/28/2020   Secondary hypercoagulable state (HCC) 07/20/2020   Paroxysmal atrial fibrillation (HCC) 06/19/2020   TIA (transient ischemic attack) 11/05/2019   Essential hypertension 11/05/2019   Cerebellar ataxia (HCC) 11/05/2019   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   C6 radiculopathy 05/04/2019   Pain due to onychomycosis of toenails of both feet 04/28/2019   Worsened handwriting 03/16/2019   Gait disturbance 03/16/2019   Slurred speech 03/16/2019   Memory loss 03/16/2019   Pain in left ankle and joints of left foot 08/06/2018  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   Foot pain, left 06/04/2016   Dysesthesia 06/04/2016   Superficial peroneal nerve neuropathy, 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   History of cervical spinal surgery 06/04/2016    ONSET DATE: 4 years   REFERRING DIAG: G11.9 (ICD-10-CM) - Cerebellar ataxia (HCC) M54.17 (ICD-10-CM) - 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   Pt accompanied by: self  PERTINENT HISTORY: history of PAF, HTN, HL, CV disease, CVA (2021)  Echo 2021 LVEF 60 to 65%  Pt found to have afib on an event monitor after CVA   The pt also has cerebellar ataxia  with falls     PAIN:  Are you having pain? No  PRECAUTIONS: Fall  RED FLAGS: None   WEIGHT BEARING RESTRICTIONS: No  FALLS: Has patient fallen in last 6 months? Yes. Number of falls 2  LIVING ENVIRONMENT: Lives with: lives with their spouse Lives in: House/apartment Stairs: Yes: Internal: 2-story home, does not use steps; bilateral but cannot reach both and External: 2-6 steps; bilateral but cannot reach both Has following equipment at home: Single point cane and Walker - 4 wheeled  PLOF: Independent with basic ADLs, Independent with community mobility with device, and still driving  PATIENT GOALS: Going to [place] in the winter and want to be more mobile. "I don't want it to get worse"  OBJECTIVE:     TODAY'S TREATMENT: 02/10/2023 Activity Comments  Reviewed HEP Pt return demo understanding-cues for slow pace with sit<>stand  Romberg stance with EO/EC head turns/nods   Forward/back stepping Sidestepping-alternating legs Intermittent UE and decreased foot clearance  Seated there ex: -hip abduction green  band, 2 x 10 -hip adduction ball squeeze 2 x 10 -LAQ 2 x 10 green band -hamstring curls 2 x 10 green band   Standing stagger stance weightshift ant/posterior Pt has difficulty sequencing; does better just going to posterior direction with cues for hinge at hips  Standing static balance As he goes into retropulsion, worked on hip hinge to regain balance     HOME EXERCISE PROGRAM Last updated:  02/03/23 Access Code: [email or web address] URL: [email or web address] Date: 02/03/2023 Prepared by: [facility] - Outpatient  Rehab - [facility]  Exercises - Sit  to Stand with Counter Support  - 1 x daily - 5 x weekly - 2 sets - 10 reps - Romberg Stance with Eyes Closed  - 1 x daily - 5 x weekly - 2 sets - 30 sec hold - Standing Anterior Posterior Weight Shift with Chair  - 1 x daily - 5 x weekly - 2 sets - 10 reps   PATIENT EDUCATION: Education details: HEP- to be performed at counter or corner for safety-verbally added in head turns/nods with Romberg stance at counter (02/10/23) Person educated: Patient Education method: Explanation, Demonstration, Tactile cues, Verbal cues, and Handouts Education comprehension: verbalized understanding and returned demonstration    Below measures were taken at time of initial evaluation unless otherwise specified:   DIAGNOSTIC FINDINGS: recent Neuro MD appointment. No imaging reported  COGNITION: Overall cognitive status: Within functional limits for tasks assessed   SENSATION: Not tested  COORDINATION: Dysdiadochokinesia Finger to nose moderately impaired Dysmetria present   EDEMA:  none  MUSCLE TONE: NT    POSTURE: No Significant postural limitations  LOWER EXTREMITY ROM:     WFL/WNL  LOWER EXTREMITY MMT:    BLE strength grossly 4/5  BED MOBILITY:  indep  TRANSFERS: Assistive device utilized: None  Sit to stand: Modified independence Stand to sit: Modified independence Chair to chair: Modified independence Floor:  NT    CURB:  Level of Assistance: SBA and CGA Assistive device utilized: Environmental consultant - 4 wheeled Curb Comments:   STAIRS: TBD  GAIT: Gait pattern: decreased stride length and trendelenburg-RLE Distance walked:  Assistive device utilized: Environmental consultant - 4 wheeled and None Level of assistance: Modified independence Comments:   FUNCTIONAL TESTS:  5 times sit to stand: 19 sec Timed up and go  (TUG): 22.78 sec w/ 7WG Berg Balance Scale: 43/56 +Romberg Gait speed: with walker 1.64 ft/sec; without walker 1.2 ft/sec     PATIENT EDUCATION: Education details: assessment details, rationale of PT intervention Person educated: Patient Education method: Explanation Education comprehension: verbalized understanding  HOME EXERCISE PROGRAM: TBD  GOALS: Goals reviewed with patient? Yes  SHORT TERM GOALS: Target date: 02/21/2023    Patient will be independent in HEP to improve functional outcomes Baseline: Goal status: IN PROGRESS  2.  Manifest improved BLE strength and balance per time 15 sec 5xSTS test Baseline: 19 sec Goal status: IN PROGRESS    LONG TERM GOALS: Target date: 03/13/23  Demo reduced risk for falls per time 12 sec TUG test Baseline: 22 sec w/ 9FA Goal status: IN PROGRESS  2.  Demo improved balance and reduced risk for falls per score 50/56 Berg Balance Test Baseline: 43/56 Goal status: IN PROGRESS  3.  Demo improved gait speed to 2.0 ft/sec to improve efficiency in community ambulation Baseline: 1.2-1.64 ft/sec Goal status: IN PROGRESS    ASSESSMENT:  CLINICAL IMPRESSION: Skilled PT session today focused initially on review of HEP.  Pt tends to go quickly through sit<>stand and PT provides cues to slow pace and increase forward lean.  Worked on additional standing balance exercises-he has increased unsteadiness with feet together EC and head motions, requiring UE support.  Worked on stepping activities, with pt having difficulty with backward stepping recovery.  He may benefit from hip extensor strengthening.  Worked on ways to incorporate hip/ankle strategy into balance recovery.  He will continue to benefit from skilled PT towards goals for improved balance and gait stability. OBJECTIVE IMPAIRMENTS: Abnormal gait, decreased activity tolerance, decreased balance, decreased coordination, decreased endurance, difficulty walking, decreased strength, and  impaired tone.   ACTIVITY LIMITATIONS: carrying, stairs, transfers, reach over head, and locomotion level  PARTICIPATION LIMITATIONS: meal prep, cleaning, interpersonal relationship, shopping, community activity, and yard work  PERSONAL FACTORS: Age, Time since onset of injury/illness/exacerbation, and 1-2 comorbidities: PMH  are also affecting patient's functional outcome.   REHAB POTENTIAL: Good  CLINICAL DECISION MAKING: Evolving/moderate complexity  EVALUATION COMPLEXITY: Moderate  PLAN:  PT FREQUENCY: 1x/week  PT DURATION: 6 weeks  PLANNED INTERVENTIONS: Therapeutic exercises, Therapeutic activity, Neuromuscular re-education, Balance training, Gait training, Patient/Family education, Self Care, Joint mobilization, Stair training, Vestibular training, Canalith repositioning, DME instructions, Aquatic Therapy, Dry Needling, Electrical stimulation, Taping, and Manual therapy  PLAN FOR NEXT SESSION: Standing hip strengthening-try ankle weights;  progress balance HEP; work on controlling retropulsion-work on hip/ankle/step strategy in posterior direction    [employer], PT 02/10/23 1:12 PM Phone: [phone] Fax: [phone]  [facility] at [facility] [address] [place], [place] [address] Phone # [phone] Fax # [phone]

## 2023-02-11 ENCOUNTER — Ambulatory Visit (INDEPENDENT_AMBULATORY_CARE_PROVIDER_SITE_OTHER): Payer: Medicare Other | Admitting: Neurology

## 2023-02-11 ENCOUNTER — Encounter: Payer: Self-pay | Admitting: Neurology

## 2023-02-11 VITALS — BP 124/68 | HR 55 | Ht 70.0 in | Wt 184.0 lb

## 2023-02-11 DIAGNOSIS — R269 Unspecified abnormalities of gait and mobility: Secondary | ICD-10-CM

## 2023-02-11 DIAGNOSIS — R1310 Dysphagia, unspecified: Secondary | ICD-10-CM | POA: Diagnosis not present

## 2023-02-11 DIAGNOSIS — R4781 Slurred speech: Secondary | ICD-10-CM

## 2023-02-11 DIAGNOSIS — G119 Hereditary ataxia, unspecified: Secondary | ICD-10-CM

## 2023-02-11 NOTE — Progress Notes (Signed)
[facility]  PATIENT: Derrick White DOB: 01/25/1941  REFERRING DOCTOR OR PCP:  Guerry Bruin SOURCE: patient, records in EPIC from [facility], MRI results, EMG/NCV results  _________________________________   HISTORICAL  CHIEF COMPLAINT:  Chief Complaint  Patient presents with   Follow-up    Rm 10. Accompanied by Darl Pikes. Patient states he is doing well, no new concerns. He has a disability placard to be filled out. He would like to know if he should continue vitamin E.    HISTORY OF PRESENT ILLNESS:  Derrick White is a 82 yo man with left ankle and foot pain.    Update  02/11/2023 Symptoms are mostly stable since the last visit.  He uses a rolling walker outside but walls/furniture or cane indoors.  He continues to have difficulty with slurred speech and swallowing.  His family is still a walk in tub with grab bars.   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  Speech is slurred and he has dysphagia.   He has had a swallow study for dysphagia.     His two children age and 5 grandkids show no symptoms.  He was +/- 72 at onset and oldest child's early to mid 34s.   We did the [employer] SCA panel (30+ genes).   It was borderline and difficult to interpret with certainty.  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   The same Nour but with other mutations can also cause episodic hemiplegic migraine and episodic ataxia.  He also has ataxia in the hands that affect handwriting and utensils.  NCV/EMG study did not show any evidence of motor neuron disease.  One facial muscle had smaller than typical motor units but recruitment was normal and 2 other facial muscles appear normal.  He does not have any difficulties with  diplopia or ptosis.  MRI did not show any source of these bulbar symptoms.  The extent of atrophy in the cerebellum was minimal.  Mild generalized atrophy and ventricles slightly larger than expected for extent of atrophy  Only mild ETOH use in past.  He has not been exposed to Dilantin or antineoplastic agents.     History:   He broke his left ankle in 2012 or 2013 requiring plate and screws.   He later had the hardware removed without benefit.    A year leater, he had surgery to decompress the peroneal nerve.    He was referred to [facility] and had the left peroneal neurectomy and removal of  L3L4 Morton's Neuroma and L2L3 intermetatarsal nerve decompression procedures.    He was tried on Lyrica but felt very off balanced and mentally slowed so he stopped.  He had mild benefit from lidocaine containing ointments. A couple different types of ointments were tried and the composition of all of them is unknown.     Balance issues may have started in 2012 but progressed very mildly .  Muh mlre progression noted between 2022 and 2023 visits.    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  There was moderate chronic microvascular ischemic change.  MR angiogram that day was normal.  DATA:  NCV/EMG study 05/04/2019  showed the following: 1.   Chronic right C6 radiculopathy.  There could also be milder C5 and C7 chronic radiculopathies. 2.   Chronic left S1 radiculopathy. 3.   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   There was no evidence of polyneuropathy, motor neuron disease or myopathy.  Laboratory data  05/04/2019: CK, acetylcholine receptor antibodies, anti-GM 1 IgG were normal or negative. 09/16/2019: Vitamin B12, ANA, vitamin B1, antigad, RPR were negative or normal.  Celiac panel was borderline.  REVIEW OF SYSTEMS: Constitutional: No fevers, chills, sweats,  or change in appetite Eyes: No visual changes, double vision, eye pain Ear, nose and throat: No hearing loss, ear pain, nasal congestion, sore throat Cardiovascular: No chest pain, palpitations Respiratory:  No shortness of breath at rest or with exertion.   No wheezes GastrointestinaI: No nausea, vomiting, diarrhea, abdominal pain, fecal incontinence.   Has GERD Genitourinary:  No dysuria, urinary retention or frequency.  No nocturia. Musculoskeletal:  No current neck pain, back pain Integumentary: No rash, pruritus, skin lesions Neurological: as above Psychiatric: No depression at this time.  No anxiety Endocrine: No palpitations, diaphoresis, change in appetite, change in weigh or increased thirst Hematologic/Lymphatic:  No anemia, purpura, petechiae. Allergic/Immunologic: No itchy/runny eyes, nasal congestion, recent allergic reactions, rashes  ALLERGIES: Allergies  Allergen Reactions   Morphine Hives and Itching   Codeine Hives and Itching    Other reaction(s): bad feeling   Propoxyphene Itching    DARVOCET   Shrimp [Shellfish Allergy] Rash    HOME MEDICATIONS:  Current Outpatient Medications:    acetaminophen (TYLENOL) 500 MG tablet, Take 500 mg by mouth every 6 (six) hours as needed for moderate pain or headache., Disp: , Rfl:    amiodarone (PACERONE) 200 MG tablet, TAKE 1 TABLET BY MOUTH DAILY *MUST KEEP UPCOMING APPOINTMENT IN MAY 2024 FOR ANY MORE REFILLS*, Disp: 90 tablet, Rfl: 0   amLODipine (NORVASC) 2.5 MG tablet, TAKE 1 TABLET BY MOUTH TWICE A DAY, Disp: 180 tablet, Rfl: 2   aspirin EC 81 MG tablet, Take 1 tablet (81 mg total) by mouth daily. Swallow whole., Disp: 90 tablet, Rfl: 3   atorvastatin (LIPITOR) 40 MG tablet, Take 1 tablet (40 mg total) by mouth daily., Disp: 90 tablet, Rfl: 3   Cholecalciferol 25 MCG (1000 UT) tablet, Take 1,000 Units by mouth at bedtime., Disp: , Rfl:    diphenhydramine-acetaminophen (TYLENOL PM) 25-500 MG TABS tablet, Take 1 tablet by  mouth at bedtime as needed (sleep)., Disp: , Rfl:    fluticasone (FLONASE) 50 MCG/ACT nasal spray, Place 1 spray into both nostrils daily as needed for allergies or rhinitis., Disp: , Rfl:    pantoprazole (PROTONIX) 40 MG tablet, Take 40 mg by mouth in the morning., Disp: , Rfl: 2   Probiotic Product (ALIGN) 4 MG CAPS, Take by mouth in the morning., Disp: , Rfl:    Propylene Glycol (SYSTANE BALANCE) 0.6 % SOLN, Place 1 drop into both eyes 2 (two) times daily as needed (burning)., Disp: , Rfl:    vitamin B-12 (CYANOCOBALAMIN) 1000 MCG tablet, Take 1,000 mcg by mouth at bedtime., Disp: , Rfl:    vitamin E 180 MG (400 UNITS) capsule, Take 400 Units by mouth at bedtime. 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   Memory loss 03/16/2019   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   Pain due to onychomycosis of toenails of both feet 04/28/2019   Pain in left ankle and joints of left foot 08/06/2018   Slurred speech 03/16/2019   Superficial peroneal nerve neuropathy, 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   TIA (transient ischemic attack) 11/05/2019   Worsened handwriting 03/16/2019    PAST SURGICAL HISTORY: See above  FAMILY HISTORY: Family History  Problem Relation Age of Onset   Stroke Neg Hx     SOCIAL HISTORY:  Social History   Socioeconomic History   Marital status: Married    Spouse name: Not on file   Number of children: Not on file   Years of education: Not on file   Highest education level: Not on file  Occupational History   Occupation: retired  Tobacco Use   Smoking status: Former     Current packs/day: 0.00    Average packs/day: 1 pack/day for 30.0 years (30.0 ttl pk-yrs)    Types: Cigarettes    Start date: 73    Quit date: 1995    Years since quitting: 29.7   Smokeless tobacco: Never  Vaping Use   Vaping status: Never Used  Substance and Sexual Activity   Alcohol use: Yes    Alcohol/week: 16.0 standard drinks of alcohol    Types: 14 Cans of beer, 2 Standard drinks or equivalent per week    Comment: 2 beers a day   Drug use: Never   Sexual activity: Not on file  Other Topics Concern   Not on file  Social History Narrative   Not on file   Social Determinants of Health   Financial Resource Strain: Not on file  Food Insecurity: Not on file  Transportation Needs: Not on file  Physical Activity: Not on file  Stress: Not on file  Social Connections: Not on file  Intimate Partner Violence: Not on file     PHYSICAL EXAM  Vitals:   02/11/23 1043  BP: 124/68  Pulse: (!) 55  Weight: 184 lb (83.5 kg)  Height: 5\' 10"  (1.778 m)    Body mass index is 26.4 kg/m.   General: The patient is well-developed and well-nourished and in no acute distress    Skin/Ext: Extremities are without rash or edema.   He has well-healed surgical scars on the left foot.  These are near the sural nerve.    Neurologic Exam  Mental status: He appears to have reduced focus and attention.  Has no trouble following commands.   No aphasia  Cranial nerves: Extraocular movements are full.  Facial strength is normal.  His voice is slurred/ataxic.   Palatal elevation and tongue protrusion are midline.   No obvious hearing deficits are noted.  Motor:  Muscle bulk is normal.   Tone is normal. Strength is  5/5 in legs/feet including EHL   Sensory:   He has mildly reduced sensation in the left foot, in the distribution of the left superficial peroneal nerve.  Mildly reduced vibration sensation in ankles and toes.   Coordination: Cerebellar testing reveals reduced   finger-nose-finger and poor heel-to-shin bilaterally.  Gait and station: Station is normal.  The gait is ataxic but he is able  to take some steps without his walker but is not safe.  He has a good stride with his walker.  Reflexes: Deep tendon reflexes are symmetric and 3 at knees and 1 at ankles.       DIAGNOSTIC DATA (LABS, IMAGING, TESTING) - I reviewed patient records, labs, notes, testing and imaging myself where available.  Lab Results  Component Value Date   WBC 7.0 10/31/2020   HGB 14.8 10/31/2020   HCT 45.7 10/31/2020   MCV 98.5 10/31/2020   PLT 336 10/31/2020      Component Value Date/Time   NA 143 01/22/2023 1159   K 5.3 (H) 01/22/2023 1159   CL 104 01/22/2023 1159   CO2 20 01/22/2023 1159   GLUCOSE 92 01/22/2023 1159   GLUCOSE 99 10/31/2020 1119   BUN 14 01/22/2023 1159   CREATININE 0.97 01/22/2023 1159   CALCIUM 9.7 01/22/2023 1159   PROT 7.3 10/01/2022 1037   ALBUMIN 4.4 10/01/2022 1037   AST 22 10/01/2022 1037   ALT 39 10/01/2022 1037   ALKPHOS 54 10/01/2022 1037   BILITOT 0.5 10/01/2022 1037   GFRNONAA >60 10/31/2020 1119   GFRAA 81 07/12/2020 1309       ASSESSMENT AND PLAN  Cerebellar ataxia (HCC)  Gait disturbance  Dysphagia, unspecified type  Slurred speech   1.   He has a progressive cerebellar ataxia (likely related to CACNA1a missense mutaiton - though he does not have the well-recognized triplet repeat mutation in this Xiomar seen in SCA 6 ).  His onset was late.  Neither of his children have symptoms and they are in the late 50s, close to 72.  He does also have some ventriculomegaly but his presentation is not consistent with normal pressure hydrocephalus. 2.   Stay active and exercise as tolerated.   3.     Advised to take extra care - use grab bars or shower chair in shower.   He will return to see me as needed if there are new or worsening neurologic symptoms.Pearletha Furl. Epimenio Foot, MD, PhD 02/11/2023, 7:44 PM Certified in Neurology,  Clinical Neurophysiology, Sleep Medicine, Pain Medicine and Neuroimaging  [facility] [address], [address] [place], [place] [address] [phone]

## 2023-02-17 ENCOUNTER — Ambulatory Visit: Payer: Medicare Other

## 2023-02-17 DIAGNOSIS — M6281 Muscle weakness (generalized): Secondary | ICD-10-CM | POA: Diagnosis not present

## 2023-02-17 DIAGNOSIS — R260 Ataxic gait: Secondary | ICD-10-CM | POA: Diagnosis not present

## 2023-02-17 DIAGNOSIS — Z9181 History of falling: Secondary | ICD-10-CM | POA: Diagnosis not present

## 2023-02-17 DIAGNOSIS — M5417 Radiculopathy, lumbosacral region: Secondary | ICD-10-CM | POA: Diagnosis not present

## 2023-02-17 DIAGNOSIS — R2681 Unsteadiness on feet: Secondary | ICD-10-CM

## 2023-02-17 DIAGNOSIS — G119 Hereditary ataxia, unspecified: Secondary | ICD-10-CM | POA: Diagnosis not present

## 2023-02-17 NOTE — Therapy (Signed)
OUTPATIENT PHYSICAL THERAPY NEURO TREATMENT   Patient Name: Derrick White MRN: [id number] DOB:11/14/1940, 82 y.o., male 38 Date: 02/17/2023   PCP: Gaspar Garbe, MD REFERRING PROVIDER: Pricilla Riffle, MD//Sater, Richard, MD  END OF SESSION:  PT End of Session - 02/17/23 1101     Visit Number 4    Number of Visits 6    Date for PT Re-Evaluation 03/13/23    Authorization Type Medicare    Progress Note Due on Visit 10    PT Start Time 1100    PT Stop Time 1145    PT Time Calculation (min) 45 min    Equipment Utilized During Treatment Gait belt    Activity Tolerance Patient tolerated treatment well    Behavior During Therapy [facility] for tasks assessed/performed;Impulsive               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   Memory loss 03/16/2019   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   Pain due to onychomycosis of toenails of both feet 04/28/2019   Pain in left ankle and joints of left foot 08/06/2018   Slurred speech 03/16/2019   Superficial peroneal nerve neuropathy, 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   TIA (transient ischemic attack) 11/05/2019   Worsened handwriting 03/16/2019   Past Surgical History:  Procedure Laterality Date   APPENDECTOMY     1940s   BACK SURGERY     1981, 1998   CARDIOVERSION N/A 09/13/2020   Procedure: CARDIOVERSION;  Surgeon: Thurmon Fair, MD;  Location: [facility] ENDOSCOPY;  Service: Cardiovascular;  Laterality: N/A;   FRACTURE SURGERY     Left ankle, sx x4   INGUINAL HERNIA REPAIR Right 03/05/2019   Procedure: OPEN RIGHT INGUINAL HERNIA REPAIR WITH MESH;  Surgeon: Berna Bue, MD;  Location: [facility] OR;  Service:  General;  Laterality: Right;   KNEE SURGERY Left 1985   LEFT ATRIAL APPENDAGE OCCLUSION N/A 07/27/2020   Procedure: LEFT ATRIAL APPENDAGE OCCLUSION;  Surgeon: Tonny Bollman, MD;  Location: [facility] INVASIVE CV LAB;  Service: Cardiovascular;  Laterality: N/A;   SHOULDER SURGERY Left    TEE WITHOUT CARDIOVERSION N/A 07/27/2020   Procedure: TRANSESOPHAGEAL ECHOCARDIOGRAM (TEE);  Surgeon: Tonny Bollman, MD;  Location: [facility] INVASIVE CV LAB;  Service: Cardiovascular;  Laterality: N/A;   TEE WITHOUT CARDIOVERSION N/A 09/13/2020   Procedure: TRANSESOPHAGEAL ECHOCARDIOGRAM (TEE);  Surgeon: Thurmon Fair, MD;  Location: [facility] ENDOSCOPY;  Service: Cardiovascular;  Laterality: N/A;   TONSILLECTOMY     1945   Patient Active Problem List   Diagnosis Date Noted   Pain in right shoulder 07/31/2021   Persistent atrial fibrillation (HCC) 08/28/2020   Secondary hypercoagulable state (HCC) 07/20/2020   Paroxysmal atrial fibrillation (HCC) 06/19/2020   TIA (transient ischemic attack) 11/05/2019   Essential hypertension 11/05/2019   Cerebellar ataxia (HCC) 11/05/2019   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   C6 radiculopathy 05/04/2019   Pain due to onychomycosis of toenails of both feet 04/28/2019   Worsened handwriting 03/16/2019   Gait disturbance 03/16/2019   Slurred speech 03/16/2019   Memory loss 03/16/2019   Pain in left ankle and joints of left foot 08/06/2018  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   Foot pain, left 06/04/2016   Dysesthesia 06/04/2016   Superficial peroneal nerve neuropathy, 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   History of cervical spinal surgery 06/04/2016    ONSET DATE: 4 years   REFERRING DIAG: G11.9 (ICD-10-CM) - Cerebellar ataxia (HCC) M54.17 (ICD-10-CM) - 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 M54.12 (ICD-10-CM) - C6 radiculopathy Z98.890 (ICD-10-CM) - History of cervical spinal surgery  THERAPY DIAG:  Unsteadiness on feet  Ataxic gait  History of falling  Muscle weakness  (generalized)  Rationale for Evaluation and Treatment: Rehabilitation  SUBJECTIVE:                                                                                                                                                                                             SUBJECTIVE STATEMENT: Had a good weekend Pt accompanied by: self  PERTINENT HISTORY: history of PAF, HTN, HL, CV disease, CVA (2021)  Echo 2021 LVEF 60 to 65%  Pt found to have afib on an event monitor after CVA   The pt also has cerebellar ataxia  with falls     PAIN:  Are you having pain? No  PRECAUTIONS: Fall  RED FLAGS: None   WEIGHT BEARING RESTRICTIONS: No  FALLS: Has patient fallen in last 6 months? Yes. Number of falls 2  LIVING ENVIRONMENT: Lives with: lives with their spouse Lives in: House/apartment Stairs: Yes: Internal: 2-story home, does not use steps; bilateral but cannot reach both and External: 2-6 steps; bilateral but cannot reach both Has following equipment at home: Single point cane and Walker - 4 wheeled  PLOF: Independent with basic ADLs, Independent with community mobility with device, and still driving  PATIENT GOALS: Going to [place] in the winter and want to be more mobile. "I don't want it to get worse"  OBJECTIVE:   TODAY'S TREATMENT: 02/17/23 Activity Comments  NU-step resistance intervals x 8 min for conditioning and neural priming 2 min warm-up 30 sec heavy/slow; 30 sec light/fast descending resistance  Activities with weighted belt -forward/backward -stepping  -lateral step and reach  Standing hip abd/flexion 2x10 4# ankle weights   Resisted walking 2x2 min Forwrads/backwards for weighted midline to improve distal control  Alternate step and overhead reach 2x10 Heavy cues for sequence, 25% success             TODAY'S TREATMENT: 02/10/2023 Activity Comments  Reviewed HEP Pt return demo understanding-cues for slow pace with sit<>stand  Romberg stance with EO/EC head  turns/nods   Forward/back stepping Sidestepping-alternating legs Intermittent UE and decreased foot clearance  Seated there ex: -hip abduction green  band, 2 x 10 -hip adduction  ball squeeze 2 x 10 -LAQ 2 x 10 green band -hamstring curls 2 x 10 green band   Standing stagger stance weightshift ant/posterior Pt has difficulty sequencing; does better just going to posterior direction with cues for hinge at hips  Standing static balance As he goes into retropulsion, worked on hip hinge to regain balance     HOME EXERCISE PROGRAM Last updated: 02/03/23 Access Code: [email or web address] URL: [email or web address] Date: 02/03/2023 Prepared by: [facility] - Outpatient  Rehab - [facility]  Exercises - Sit to Stand with Counter Support  - 1 x daily - 5 x weekly - 2 sets - 10 reps - Romberg Stance with Eyes Closed  - 1 x daily - 5 x weekly - 2 sets - 30 sec hold - Standing Anterior Posterior Weight Shift with Chair  - 1 x daily - 5 x weekly - 2 sets - 10 reps   PATIENT EDUCATION: Education details: HEP- to be performed at counter or corner for safety-verbally added in head turns/nods with Romberg stance at counter (02/10/23) Person educated: Patient Education method: Explanation, Demonstration, Tactile cues, Verbal cues, and Handouts Education comprehension: verbalized understanding and returned demonstration    Below measures were taken at time of initial evaluation unless otherwise specified:   DIAGNOSTIC FINDINGS: recent Neuro MD appointment. No imaging reported  COGNITION: Overall cognitive status: Within functional limits for tasks assessed   SENSATION: Not tested  COORDINATION: Dysdiadochokinesia Finger to nose moderately impaired Dysmetria present   EDEMA:  none  MUSCLE TONE: NT    POSTURE: No Significant postural limitations  LOWER EXTREMITY ROM:     WFL/WNL  LOWER EXTREMITY MMT:    BLE strength grossly 4/5  BED MOBILITY:   indep  TRANSFERS: Assistive device utilized: None  Sit to stand: Modified independence Stand to sit: Modified independence Chair to chair: Modified independence Floor:  NT    CURB:  Level of Assistance: SBA and CGA Assistive device utilized: Environmental consultant - 4 wheeled Curb Comments:   STAIRS: TBD  GAIT: Gait pattern: decreased stride length and trendelenburg-RLE Distance walked:  Assistive device utilized: Environmental consultant - 4 wheeled and None Level of assistance: Modified independence Comments:   FUNCTIONAL TESTS:  5 times sit to stand: 19 sec Timed up and go (TUG): 22.78 sec w/ 1BJ Berg Balance Scale: 43/56 +Romberg Gait speed: with walker 1.64 ft/sec; without walker 1.2 ft/sec     PATIENT EDUCATION: Education details: assessment details, rationale of PT intervention Person educated: Patient Education method: Explanation Education comprehension: verbalized understanding  HOME EXERCISE PROGRAM: TBD  GOALS: Goals reviewed with patient? Yes  SHORT TERM GOALS: Target date: 02/21/2023    Patient will be independent in HEP to improve functional outcomes Baseline: Goal status: IN PROGRESS  2.  Manifest improved BLE strength and balance per time 15 sec 5xSTS test Baseline: 19 sec Goal status: IN PROGRESS    LONG TERM GOALS: Target date: 03/13/23  Demo reduced risk for falls per time 12 sec TUG test Baseline: 22 sec w/ 4NW Goal status: IN PROGRESS  2.  Demo improved balance and reduced risk for falls per score 50/56 Berg Balance Test Baseline: 43/56 Goal status: IN PROGRESS  3.  Demo improved gait speed to 2.0 ft/sec to improve efficiency in community ambulation Baseline: 1.2-1.64 ft/sec Goal status: IN PROGRESS    ASSESSMENT:  CLINICAL IMPRESSION: Initiated activities with weighted gait belt to impose added trunk weight for proprioceptive feedback as we performed large amplitude and alternating movements, minimal improvement to distal  control with this.  Standing hip strength movements with distal weights for isolation strength/control. Resisted walking with chained belt around midsection and performing variuos forwards/backwards walking techniques with notable improvement to distal control under these conditions.  Ended with alternating step/reach task to improve coordination requiring heavy cues for sequence and with 25% success rate. Continued sessions to progress strength, balance, and motor control OBJECTIVE IMPAIRMENTS: Abnormal gait, decreased activity tolerance, decreased balance, decreased coordination, decreased endurance, difficulty walking, decreased strength, and impaired tone.   ACTIVITY LIMITATIONS: carrying, stairs, transfers, reach over head, and locomotion level  PARTICIPATION LIMITATIONS: meal prep, cleaning, interpersonal relationship, shopping, community activity, and yard work  PERSONAL FACTORS: Age, Time since onset of injury/illness/exacerbation, and 1-2 comorbidities: PMH  are also affecting patient's functional outcome.   REHAB POTENTIAL: Good  CLINICAL DECISION MAKING: Evolving/moderate complexity  EVALUATION COMPLEXITY: Moderate  PLAN:  PT FREQUENCY: 1x/week  PT DURATION: 6 weeks  PLANNED INTERVENTIONS: Therapeutic exercises, Therapeutic activity, Neuromuscular re-education, Balance training, Gait training, Patient/Family education, Self Care, Joint mobilization, Stair training, Vestibular training, Canalith repositioning, DME instructions, Aquatic Therapy, Dry Needling, Electrical stimulation, Taping, and Manual therapy  PLAN FOR NEXT SESSION: Continued Standing hip strengthening-try ankle weights;  progress balance HEP; work on controlling retropulsion-work on hip/ankle/step strategy in posterior direction   11:53 AM, 02/17/23 M. Shary Decamp, PT, DPT Physical Therapist- [place] Office Number: [phone]

## 2023-02-21 NOTE — Therapy (Signed)
OUTPATIENT PHYSICAL THERAPY NEURO TREATMENT   Patient Name: Derrick White MRN: [id number] DOB:Dec 17, 1940, 82 y.o., male Today's Date: 02/24/2023   PCP: Gaspar Garbe, MD REFERRING PROVIDER: Pricilla Riffle, MD//Sater, Richard, MD  END OF SESSION:  PT End of Session - 02/24/23 1143     Visit Number 5    Number of Visits 6    Date for PT Re-Evaluation 03/13/23    Authorization Type Medicare    Progress Note Due on Visit 10    PT Start Time 1059    PT Stop Time 1143    PT Time Calculation (min) 44 min    Equipment Utilized During Treatment Gait belt    Activity Tolerance Patient tolerated treatment well    Behavior During Therapy [facility] for tasks assessed/performed;Impulsive                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   Memory loss 03/16/2019   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   Pain due to onychomycosis of toenails of both feet 04/28/2019   Pain in left ankle and joints of left foot 08/06/2018   Slurred speech 03/16/2019   Superficial peroneal nerve neuropathy, 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   TIA (transient ischemic attack) 11/05/2019   Worsened handwriting 03/16/2019   Past Surgical History:  Procedure Laterality Date   APPENDECTOMY     1940s   BACK SURGERY     1981, 1998   CARDIOVERSION N/A 09/13/2020   Procedure: CARDIOVERSION;  Surgeon: Thurmon Fair, MD;  [facility]: [facility];  Service: Cardiovascular;  Laterality: N/A;   FRACTURE SURGERY     Left ankle, sx x4   INGUINAL HERNIA REPAIR Right 03/05/2019   Procedure: OPEN RIGHT INGUINAL HERNIA REPAIR WITH MESH;  Surgeon: Berna Bue, MD;  Location: [facility] OR;   Service: General;  Laterality: Right;   KNEE SURGERY Left 1985   LEFT ATRIAL APPENDAGE OCCLUSION N/A 07/27/2020   Procedure: LEFT ATRIAL APPENDAGE OCCLUSION;  Surgeon: Tonny Bollman, MD;  Location: [facility] INVASIVE CV LAB;  Service: Cardiovascular;  Laterality: N/A;   SHOULDER SURGERY Left    TEE WITHOUT CARDIOVERSION N/A 07/27/2020   Procedure: TRANSESOPHAGEAL ECHOCARDIOGRAM (TEE);  Surgeon: Tonny Bollman, MD;  Location: [facility] INVASIVE CV LAB;  Service: Cardiovascular;  Laterality: N/A;   TEE WITHOUT CARDIOVERSION N/A 09/13/2020   Procedure: TRANSESOPHAGEAL ECHOCARDIOGRAM (TEE);  Surgeon: Thurmon Fair, MD;  Location: [facility] ENDOSCOPY;  Service: Cardiovascular;  Laterality: N/A;   TONSILLECTOMY     1945   Patient Active Problem List   Diagnosis Date Noted   Pain in right shoulder 07/31/2021   Persistent atrial fibrillation (HCC) 08/28/2020   Secondary hypercoagulable state (HCC) 07/20/2020   Paroxysmal atrial fibrillation (HCC) 06/19/2020   TIA (transient ischemic attack) 11/05/2019   Essential hypertension 11/05/2019   Cerebellar ataxia (HCC) 11/05/2019   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   C6 radiculopathy 05/04/2019   Pain due to onychomycosis of toenails of both feet 04/28/2019   Worsened handwriting 03/16/2019   Gait disturbance 03/16/2019   Slurred speech 03/16/2019   Memory loss 03/16/2019   Pain in left ankle and joints of left foot  08/06/2018   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   Foot pain, left 06/04/2016   Dysesthesia 06/04/2016   Superficial peroneal nerve neuropathy, 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   History of cervical spinal surgery 06/04/2016    ONSET DATE: 4 years   REFERRING DIAG: G11.9 (ICD-10-CM) - Cerebellar ataxia (HCC) M54.17 (ICD-10-CM) - 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 M54.12 (ICD-10-CM) - C6 radiculopathy Z98.890 (ICD-10-CM) - History of cervical spinal surgery  THERAPY DIAG:  Unsteadiness on feet  Ataxic gait  History of falling  Muscle weakness  (generalized)  Other lack of coordination  Rationale for Evaluation and Treatment: Rehabilitation  SUBJECTIVE:                                                                                                                                                                                             SUBJECTIVE STATEMENT: Had a good weekend- enjoyed the sun. Denies falls.   Pt accompanied by: self  PERTINENT HISTORY: history of PAF, HTN, HL, CV disease, CVA (2021)  Echo 2021 LVEF 60 to 65%  Pt found to have afib on an event monitor after CVA   The pt also has cerebellar ataxia  with falls     PAIN:  Are you having pain? No  PRECAUTIONS: Fall  RED FLAGS: None   WEIGHT BEARING RESTRICTIONS: No  FALLS: Has patient fallen in last 6 months? Yes. Number of falls 2  LIVING ENVIRONMENT: Lives with: lives with their spouse Lives in: House/apartment Stairs: Yes: Internal: 2-story home, does not use steps; bilateral but cannot reach both and External: 2-6 steps; bilateral but cannot reach both Has following equipment at home: Single point cane and Walker - 4 wheeled  PLOF: Independent with basic ADLs, Independent with community mobility with device, and still driving  PATIENT GOALS: Going to [place] in the winter and want to be more mobile. "I don't want it to get worse"  OBJECTIVE:     TODAY'S TREATMENT: 02/24/23 Activity Comments  STS with green TB across front of hips to increase anterior wt shift 2x10 Cues for max hip extension for glute activation   alt backward stepping, alt forward stepping Performed with and without 3# ankle wts, use of targets to improved stepping consistency, cues to reset back to a tall posture   Gait training with 4WW with 10# weight in walker 53ft Pt reports no improvement in stability   Lateral wt shift  Heavy manual and verbal cueing for proper sequence and coordination  Standing stagger stance weightshift ant/posterior Heavy manual and verbal cueing  for proper sequence and coordination  wall bumps ant/pos EO/EC Required significant cueing for pacing and sequencing  HOME EXERCISE PROGRAM Last updated: 02/03/23 Access Code: [email or web address] URL: [email or web address] Date: 02/24/2023 Prepared by: [facility] - Outpatient  Rehab - [facility]  Exercises - Sit to Stand with Counter Support  - 1 x daily - 5 x weekly - 2 sets - 10 reps - Romberg Stance with Eyes Closed  - 1 x daily - 5 x weekly - 2 sets - 30 sec hold - Standing Anterior Posterior Weight Shift with Chair  - 1 x daily - 5 x weekly - 2 sets - 10 reps - Lateral Weight Shift with Parallel Bars (BKA)  - 1 x daily - 5 x weekly - 2 sets - 10 reps - Alternating Step Forward with Support  - 1 x daily - 5 x weekly - 2 sets - 10 reps   PATIENT EDUCATION: Education details: HEP update to be performed at counter  Person educated: Patient Education method: Explanation, Demonstration, Tactile cues, Verbal cues, and Handouts Education comprehension: verbalized understanding and returned demonstration    Below measures were taken at time of initial evaluation unless otherwise specified:   DIAGNOSTIC FINDINGS: recent Neuro MD appointment. No imaging reported  COGNITION: Overall cognitive status: Within functional limits for tasks assessed   SENSATION: Not tested  COORDINATION: Dysdiadochokinesia Finger to nose moderately impaired Dysmetria present   EDEMA:  none  MUSCLE TONE: NT    POSTURE: No Significant postural limitations  LOWER EXTREMITY ROM:     WFL/WNL  LOWER EXTREMITY MMT:    BLE strength grossly 4/5  BED MOBILITY:  indep  TRANSFERS: Assistive device utilized: None  Sit to stand: Modified independence Stand to sit: Modified independence Chair to chair: Modified independence Floor:  NT    CURB:  Level of Assistance: SBA and CGA Assistive device utilized: Environmental consultant - 4 wheeled Curb Comments:    STAIRS: TBD  GAIT: Gait pattern: decreased stride length and trendelenburg-RLE Distance walked:  Assistive device utilized: Environmental consultant - 4 wheeled and None Level of assistance: Modified independence Comments:   FUNCTIONAL TESTS:  5 times sit to stand: 19 sec Timed up and go (TUG): 22.78 sec w/ 7FI Berg Balance Scale: 43/56 +Romberg Gait speed: with walker 1.64 ft/sec; without walker 1.2 ft/sec     PATIENT EDUCATION: Education details: assessment details, rationale of PT intervention Person educated: Patient Education method: Explanation Education comprehension: verbalized understanding  HOME EXERCISE PROGRAM: TBD  GOALS: Goals reviewed with patient? Yes  SHORT TERM GOALS: Target date: 02/21/2023    Patient will be independent in HEP to improve functional outcomes Baseline: Goal status: IN PROGRESS  2.  Manifest improved BLE strength and balance per time 15 sec 5xSTS test Baseline: 19 sec Goal status: IN PROGRESS    LONG TERM GOALS: Target date: 03/13/23  Demo reduced risk for falls per time 12 sec TUG test Baseline: 22 sec w/ 4PP Goal status: IN PROGRESS  2.  Demo improved balance and reduced risk for falls per score 50/56 Berg Balance Test Baseline: 43/56 Goal status: IN PROGRESS  3.  Demo improved gait speed to 2.0 ft/sec to improve efficiency in community ambulation Baseline: 1.2-1.64 ft/sec Goal status: IN PROGRESS    ASSESSMENT:  CLINICAL IMPRESSION: Patient arrived to session without complaints. Worked on additional balance/strengthening challenges with STS. Balance activities used use of visual targets and weights. Patient required cueing for more upright posture with many of today's activities and had difficulty coordinating/sequencing tasks. Improved performance after cueing and review, however patient will continue to need cues for pacing and control.  No complaints upon leaving.  OBJECTIVE IMPAIRMENTS: Abnormal gait, decreased activity  tolerance, decreased balance, decreased coordination, decreased endurance, difficulty walking, decreased strength, and impaired tone.   ACTIVITY LIMITATIONS: carrying, stairs, transfers, reach over head, and locomotion level  PARTICIPATION LIMITATIONS: meal prep, cleaning, interpersonal relationship, shopping, community activity, and yard work  PERSONAL FACTORS: Age, Time since onset of injury/illness/exacerbation, and 1-2 comorbidities: PMH  are also affecting patient's functional outcome.   REHAB POTENTIAL: Good  CLINICAL DECISION MAKING: Evolving/moderate complexity  EVALUATION COMPLEXITY: Moderate  PLAN:  PT FREQUENCY: 1x/week  PT DURATION: 6 weeks  PLANNED INTERVENTIONS: Therapeutic exercises, Therapeutic activity, Neuromuscular re-education, Balance training, Gait training, Patient/Family education, Self Care, Joint mobilization, Stair training, Vestibular training, Canalith repositioning, DME instructions, Aquatic Therapy, Dry Needling, Electrical stimulation, Taping, and Manual therapy  PLAN FOR NEXT SESSION: Continued Standing hip strengthening-try ankle weights;  progress balance HEP; work on controlling retropulsion-work on hip/ankle/step strategy in posterior direction     Anette Guarneri, PT, DPT 02/24/23 11:46 AM  [facility] at [facility] [address] [place], [place] [address] Phone # [phone] Fax # [phone]

## 2023-02-24 ENCOUNTER — Encounter: Payer: Self-pay | Admitting: Physical Therapy

## 2023-02-24 ENCOUNTER — Ambulatory Visit: Payer: Medicare Other | Attending: Internal Medicine | Admitting: Physical Therapy

## 2023-02-24 DIAGNOSIS — R260 Ataxic gait: Secondary | ICD-10-CM | POA: Diagnosis not present

## 2023-02-24 DIAGNOSIS — R278 Other lack of coordination: Secondary | ICD-10-CM | POA: Insufficient documentation

## 2023-02-24 DIAGNOSIS — M6281 Muscle weakness (generalized): Secondary | ICD-10-CM | POA: Diagnosis not present

## 2023-02-24 DIAGNOSIS — Z9181 History of falling: Secondary | ICD-10-CM | POA: Diagnosis not present

## 2023-02-24 DIAGNOSIS — R270 Ataxia, unspecified: Secondary | ICD-10-CM | POA: Insufficient documentation

## 2023-02-24 DIAGNOSIS — R2681 Unsteadiness on feet: Secondary | ICD-10-CM | POA: Insufficient documentation

## 2023-02-25 ENCOUNTER — Other Ambulatory Visit: Payer: Self-pay | Admitting: Cardiology

## 2023-02-28 ENCOUNTER — Ambulatory Visit: Payer: Medicare Other | Attending: Internal Medicine

## 2023-02-28 DIAGNOSIS — E875 Hyperkalemia: Secondary | ICD-10-CM

## 2023-02-28 LAB — BASIC METABOLIC PANEL
BUN/Creatinine Ratio: 17 (ref 10–24)
BUN: 19 mg/dL (ref 8–27)
CO2: 24 mmol/L (ref 20–29)
Calcium: 9.6 mg/dL (ref 8.6–10.2)
Chloride: 101 mmol/L (ref 96–106)
Creatinine, Ser: 1.14 mg/dL (ref 0.76–1.27)
Glucose: 78 mg/dL (ref 70–99)
Potassium: 4.5 mmol/L (ref 3.5–5.2)
Sodium: 136 mmol/L (ref 134–144)
eGFR: 64 mL/min/{1.73_m2} (ref >59)

## 2023-03-03 ENCOUNTER — Ambulatory Visit: Payer: Medicare Other

## 2023-03-03 DIAGNOSIS — Z9181 History of falling: Secondary | ICD-10-CM

## 2023-03-03 DIAGNOSIS — M6281 Muscle weakness (generalized): Secondary | ICD-10-CM

## 2023-03-03 DIAGNOSIS — R270 Ataxia, unspecified: Secondary | ICD-10-CM | POA: Diagnosis not present

## 2023-03-03 DIAGNOSIS — R260 Ataxic gait: Secondary | ICD-10-CM

## 2023-03-03 DIAGNOSIS — R2681 Unsteadiness on feet: Secondary | ICD-10-CM

## 2023-03-03 DIAGNOSIS — R278 Other lack of coordination: Secondary | ICD-10-CM | POA: Diagnosis not present

## 2023-03-03 NOTE — Therapy (Signed)
OUTPATIENT PHYSICAL THERAPY NEURO TREATMENT and D/C Summary   Patient Name: Derrick White MRN: [id number] DOB:1940-08-06, 82 y.o., male Today's Date: 03/03/2023   PCP: Gaspar Garbe, MD REFERRING PROVIDER: Pricilla Riffle, MD//Sater, Richard, MD PHYSICAL THERAPY DISCHARGE SUMMARY  Visits from Start of Care: 6  Current functional level related to goals / functional outcomes: See below for outcome measures. Able to progress POC details   Remaining deficits: ataxia   Education / Equipment: HEP   Patient agrees to discharge. Patient goals were partially met. Patient is being discharged due to being pleased with the current functional level.   END OF SESSION:  PT End of Session - 03/03/23 1103     Visit Number 6    Number of Visits 6    Date for PT Re-Evaluation 03/13/23    Authorization Type Medicare    Progress Note Due on Visit 10    PT Start Time 1100    PT Stop Time 1145    PT Time Calculation (min) 45 min    Equipment Utilized During Treatment Gait belt    Activity Tolerance Patient tolerated treatment well    Behavior During Therapy WFL for tasks assessed/performed;Impulsive                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   Memory loss 03/16/2019   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   Pain due to onychomycosis of toenails of both feet 04/28/2019   Pain in left ankle and joints of left foot 08/06/2018   Slurred speech 03/16/2019   Superficial peroneal nerve neuropathy, 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   TIA (transient ischemic attack) 11/05/2019   Worsened handwriting 03/16/2019   Past Surgical History:  Procedure  Laterality Date   APPENDECTOMY     1940s   BACK SURGERY     1981, 1998   CARDIOVERSION N/A 09/13/2020   Procedure: CARDIOVERSION;  Surgeon: Thurmon Fair, MD;  Location: [facility] ENDOSCOPY;  Service: Cardiovascular;  Laterality: N/A;   FRACTURE SURGERY     Left ankle, sx x4   INGUINAL HERNIA REPAIR Right 03/05/2019   Procedure: OPEN RIGHT INGUINAL HERNIA REPAIR WITH MESH;  Surgeon: Berna Bue, MD;  Location: [facility] OR;  Service: General;  Laterality: Right;   KNEE SURGERY Left 1985   LEFT ATRIAL APPENDAGE OCCLUSION N/A 07/27/2020   Procedure: LEFT ATRIAL APPENDAGE OCCLUSION;  Surgeon: Tonny Bollman, MD;  Location: [facility] INVASIVE CV LAB;  Service: Cardiovascular;  Laterality: N/A;   SHOULDER SURGERY Left    TEE WITHOUT CARDIOVERSION N/A 07/27/2020   Procedure: TRANSESOPHAGEAL ECHOCARDIOGRAM (TEE);  Surgeon: Tonny Bollman, MD;  Location: [facility] INVASIVE CV LAB;  Service: Cardiovascular;  Laterality: N/A;   TEE WITHOUT CARDIOVERSION N/A 09/13/2020   Procedure: TRANSESOPHAGEAL ECHOCARDIOGRAM (TEE);  Surgeon: Thurmon Fair, MD;  Location: [facility] ENDOSCOPY;  Service: Cardiovascular;  Laterality: N/A;   TONSILLECTOMY     1945   Patient Active Problem List   Diagnosis Date Noted   Pain in right shoulder 07/31/2021   Persistent atrial fibrillation (HCC) 08/28/2020   Secondary hypercoagulable state (HCC) 07/20/2020   Paroxysmal atrial fibrillation (HCC) 06/19/2020   TIA (  transient ischemic attack) 11/05/2019   Essential hypertension 11/05/2019   Cerebellar ataxia (HCC) 11/05/2019   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   C6 radiculopathy 05/04/2019   Pain due to onychomycosis of toenails of both feet 04/28/2019   Worsened handwriting 03/16/2019   Gait disturbance 03/16/2019   Slurred speech 03/16/2019   Memory loss 03/16/2019   Pain in left ankle and joints of left foot 08/06/2018   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   Foot pain, left 06/04/2016   Dysesthesia 06/04/2016   Superficial  peroneal nerve neuropathy, left 06/04/2016   History of cervical spinal surgery 06/04/2016    ONSET DATE: 4 years   REFERRING DIAG: G11.9 (ICD-10-CM) - Cerebellar ataxia (HCC) M54.17 (ICD-10-CM) - 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 M54.12 (ICD-10-CM) - C6 radiculopathy Z98.890 (ICD-10-CM) - History of cervical spinal surgery  THERAPY DIAG:  Unsteadiness on feet  Ataxic gait  History of falling  Muscle weakness (generalized)  Ataxia  Rationale for Evaluation and Treatment: Rehabilitation  SUBJECTIVE:                                                                                                                                                                                             SUBJECTIVE STATEMENT: No new issues, working out at fitness center 3x/wk  Pt accompanied by: self  PERTINENT HISTORY: history of PAF, HTN, HL, CV disease, CVA (2021)  Echo 2021 LVEF 60 to 65%  Pt found to have afib on an event monitor after CVA   The pt also has cerebellar ataxia  with falls     PAIN:  Are you having pain? No  PRECAUTIONS: Fall  RED FLAGS: None   WEIGHT BEARING RESTRICTIONS: No  FALLS: Has patient fallen in last 6 months? Yes. Number of falls 2  LIVING ENVIRONMENT: Lives with: lives with their spouse Lives in: House/apartment Stairs: Yes: Internal: 2-story home, does not use steps; bilateral but cannot reach both and External: 2-6 steps; bilateral but cannot reach both Has following equipment at home: Single point cane and Walker - 4 wheeled  PLOF: Independent with basic ADLs, Independent with community mobility with device, and still driving  PATIENT GOALS: Going to [place] in the winter and want to be more mobile. "I don't want it to get worse"  OBJECTIVE:   TODAY'S TREATMENT: 03/03/23 Activity Comments  5xSTS 1st trial: 28 sec 2nd trial: 15 sec   TUG test 18 sec w/ RW  Berg Balance 50/56  Gait speed 18 sec = 1.8 ft/sec  LE lift off 2x10 For SLS  Kettlebell  deadlift 3x10 W chair for support and form 15#  TODAY'S TREATMENT: 02/24/23 Activity Comments  STS with green TB across front of hips to increase anterior wt shift 2x10 Cues for max hip extension for glute activation   alt backward stepping, alt forward stepping Performed with and without 3# ankle wts, use of targets to improved stepping consistency, cues to reset back to a tall posture   Gait training with 4WW with 10# weight in walker 38ft Pt reports no improvement in stability   Lateral wt shift  Heavy manual and verbal cueing for proper sequence and coordination  Standing stagger stance weightshift ant/posterior Heavy manual and verbal cueing for proper sequence and coordination  wall bumps ant/pos EO/EC Required significant cueing for pacing and sequencing           HOME EXERCISE PROGRAM Last updated: 02/03/23 Access Code: [email or web address] URL: [email or web address] Date: 02/24/2023 Prepared by: [facility] - Outpatient  Rehab - [facility]  Exercises - Sit to Stand with Counter Support  - 1 x daily - 5 x weekly - 2 sets - 10 reps - Romberg Stance with Eyes Closed  - 1 x daily - 5 x weekly - 2 sets - 30 sec hold - Standing Anterior Posterior Weight Shift with Chair  - 1 x daily - 5 x weekly - 2 sets - 10 reps - Lateral Weight Shift with Parallel Bars (BKA)  - 1 x daily - 5 x weekly - 2 sets - 10 reps - Alternating Step Forward with Support  - 1 x daily - 5 x weekly - 2 sets - 10 reps - Standing Foot Lift on Box (BKA)  - 1 x daily - 7 x weekly - 1-3 sets - 10 reps - Kettlebell Squat  - 1 x daily - 7 x weekly - 3 sets - 10 reps  PATIENT EDUCATION: Education details: HEP update to be performed at counter  Person educated: Patient Education method: Explanation, Demonstration, Tactile cues, Verbal cues, and Handouts Education comprehension: verbalized understanding and returned demonstration    Below measures were taken at time of initial evaluation unless  otherwise specified:   DIAGNOSTIC FINDINGS: recent Neuro MD appointment. No imaging reported  COGNITION: Overall cognitive status: Within functional limits for tasks assessed   SENSATION: Not tested  COORDINATION: Dysdiadochokinesia Finger to nose moderately impaired Dysmetria present   EDEMA:  none  MUSCLE TONE: NT    POSTURE: No Significant postural limitations  LOWER EXTREMITY ROM:     WFL/WNL  LOWER EXTREMITY MMT:    BLE strength grossly 4/5  BED MOBILITY:  indep  TRANSFERS: Assistive device utilized: None  Sit to stand: Modified independence Stand to sit: Modified independence Chair to chair: Modified independence Floor:  NT    CURB:  Level of Assistance: SBA and CGA Assistive device utilized: Environmental consultant - 4 wheeled Curb Comments:   STAIRS: TBD  GAIT: Gait pattern: decreased stride length and trendelenburg-RLE Distance walked:  Assistive device utilized: Environmental consultant - 4 wheeled and None Level of assistance: Modified independence Comments:   FUNCTIONAL TESTS:  5 times sit to stand: 19 sec Timed up and go (TUG): 22.78 sec w/ 6NG Berg Balance Scale: 43/56 +Romberg Gait speed: with walker 1.64 ft/sec; without walker 1.2 ft/sec     PATIENT EDUCATION: Education details: assessment details, rationale of PT intervention Person educated: Patient Education method: Explanation Education comprehension: verbalized understanding  HOME EXERCISE PROGRAM: TBD  GOALS: Goals reviewed with patient? Yes  SHORT TERM GOALS: Target date: 02/21/2023    Patient will be independent in HEP to  improve functional outcomes Baseline: Goal status: MET  2.  Manifest improved BLE strength and balance per time 15 sec 5xSTS test Baseline: 19 sec; 15 sec Goal status: MET    LONG TERM GOALS: Target date: 03/13/23  Demo reduced risk for falls per time 12 sec TUG test Baseline: 22 sec w/ 8JX; (03/03/23) 18 sec Goal status: NOT MET  2.  Demo improved balance and  reduced risk for falls per score 50/56 Berg Balance Test Baseline: 43/56; (03/03/23) 50/56 Goal status: MET  3.  Demo improved gait speed to 2.0 ft/sec to improve efficiency in community ambulation Baseline: 1.2-1.64 ft/sec; 1.8 ft/sec Goal status: NOT MET    ASSESSMENT:  CLINICAL IMPRESSION: Review of POC details for assessment and demonstrates improved performance with 5xSTS task and progressed to activities for HEP addition for single limb support with good return demonstration after guided rehearsal. Sharlene Motts Balance Test reveals significant improvement from initial 43 to 50/56 indicating progress from high risk to low risk for falls.  Gait speed reveals modest improvement from initial 1.2 to 1.8 ft/sec w/ 9JY.  Demo and review of functional lifting form to provide safe means for progressive loading using kettlebell and seat surface to perform deadlift with hip hinge with good return demo.  Pt feels confident in current activities and notes ability to replicate at home and fitness facility. Will D/C to HEP at this time  OBJECTIVE IMPAIRMENTS: Abnormal gait, decreased activity tolerance, decreased balance, decreased coordination, decreased endurance, difficulty walking, decreased strength, and impaired tone.   ACTIVITY LIMITATIONS: carrying, stairs, transfers, reach over head, and locomotion level  PARTICIPATION LIMITATIONS: meal prep, cleaning, interpersonal relationship, shopping, community activity, and yard work  PERSONAL FACTORS: Age, Time since onset of injury/illness/exacerbation, and 1-2 comorbidities: PMH  are also affecting patient's functional outcome.   REHAB POTENTIAL: Good  CLINICAL DECISION MAKING: Evolving/moderate complexity  EVALUATION COMPLEXITY: Moderate  PLAN:  PT FREQUENCY: 1x/week  PT DURATION: 6 weeks  PLANNED INTERVENTIONS: Therapeutic exercises, Therapeutic activity, Neuromuscular re-education, Balance training, Gait training, Patient/Family education, Self  Care, Joint mobilization, Stair training, Vestibular training, Canalith repositioning, DME instructions, Aquatic Therapy, Dry Needling, Electrical stimulation, Taping, and Manual therapy  PLAN FOR NEXT SESSION: D/C to HEP    12:39 PM, 03/03/23 M. Shary Decamp, PT, DPT Physical Therapist- [place] Office Number: [phone]

## 2023-03-10 ENCOUNTER — Ambulatory Visit: Payer: Medicare Other | Admitting: Physical Therapy

## 2023-03-12 DIAGNOSIS — R350 Frequency of micturition: Secondary | ICD-10-CM | POA: Diagnosis not present

## 2023-03-12 DIAGNOSIS — R3915 Urgency of urination: Secondary | ICD-10-CM | POA: Diagnosis not present

## 2023-03-15 DIAGNOSIS — Z23 Encounter for immunization: Secondary | ICD-10-CM | POA: Diagnosis not present

## 2023-04-04 DIAGNOSIS — M8580 Other specified disorders of bone density and structure, unspecified site: Secondary | ICD-10-CM | POA: Diagnosis not present

## 2023-04-04 DIAGNOSIS — Z79899 Other long term (current) drug therapy: Secondary | ICD-10-CM | POA: Diagnosis not present

## 2023-04-04 DIAGNOSIS — N401 Enlarged prostate with lower urinary tract symptoms: Secondary | ICD-10-CM | POA: Diagnosis not present

## 2023-04-04 DIAGNOSIS — E785 Hyperlipidemia, unspecified: Secondary | ICD-10-CM | POA: Diagnosis not present

## 2023-04-04 DIAGNOSIS — R7989 Other specified abnormal findings of blood chemistry: Secondary | ICD-10-CM | POA: Diagnosis not present

## 2023-04-11 DIAGNOSIS — M8580 Other specified disorders of bone density and structure, unspecified site: Secondary | ICD-10-CM | POA: Diagnosis not present

## 2023-04-11 DIAGNOSIS — I4819 Other persistent atrial fibrillation: Secondary | ICD-10-CM | POA: Diagnosis not present

## 2023-04-11 DIAGNOSIS — Z1331 Encounter for screening for depression: Secondary | ICD-10-CM | POA: Diagnosis not present

## 2023-04-11 DIAGNOSIS — G5732 Lesion of lateral popliteal nerve, left lower limb: Secondary | ICD-10-CM | POA: Diagnosis not present

## 2023-04-11 DIAGNOSIS — G119 Hereditary ataxia, unspecified: Secondary | ICD-10-CM | POA: Diagnosis not present

## 2023-04-11 DIAGNOSIS — G9050 Complex regional pain syndrome I, unspecified: Secondary | ICD-10-CM | POA: Diagnosis not present

## 2023-04-11 DIAGNOSIS — Z0000 Encounter for general adult medical examination without abnormal findings: Secondary | ICD-10-CM | POA: Diagnosis not present

## 2023-04-11 DIAGNOSIS — R82998 Other abnormal findings in urine: Secondary | ICD-10-CM | POA: Diagnosis not present

## 2023-04-11 DIAGNOSIS — N401 Enlarged prostate with lower urinary tract symptoms: Secondary | ICD-10-CM | POA: Diagnosis not present

## 2023-04-11 DIAGNOSIS — E7800 Pure hypercholesterolemia, unspecified: Secondary | ICD-10-CM | POA: Diagnosis not present

## 2023-04-11 DIAGNOSIS — K219 Gastro-esophageal reflux disease without esophagitis: Secondary | ICD-10-CM | POA: Diagnosis not present

## 2023-04-11 DIAGNOSIS — G3184 Mild cognitive impairment, so stated: Secondary | ICD-10-CM | POA: Diagnosis not present

## 2023-04-11 DIAGNOSIS — D6869 Other thrombophilia: Secondary | ICD-10-CM | POA: Diagnosis not present

## 2023-04-11 DIAGNOSIS — Z1339 Encounter for screening examination for other mental health and behavioral disorders: Secondary | ICD-10-CM | POA: Diagnosis not present

## 2023-04-11 DIAGNOSIS — G4700 Insomnia, unspecified: Secondary | ICD-10-CM | POA: Diagnosis not present

## 2023-05-28 ENCOUNTER — Other Ambulatory Visit: Payer: Self-pay

## 2023-05-28 MED ORDER — AMLODIPINE BESYLATE 2.5 MG PO TABS: 2.5000 mg | ORAL_TABLET | Freq: Two times a day (BID) | ORAL | 2 refills | Status: DC

## 2023-05-28 MED ORDER — ATORVASTATIN CALCIUM 40 MG PO TABS: 40.0000 mg | ORAL_TABLET | Freq: Every day | ORAL | 3 refills | Status: DC

## 2023-05-28 MED ORDER — AMIODARONE HCL 200 MG PO TABS: 200.0000 mg | ORAL_TABLET | Freq: Every day | ORAL | 0 refills | Status: DC

## 2023-06-04 ENCOUNTER — Other Ambulatory Visit: Payer: Self-pay | Admitting: Cardiology

## 2023-06-05 DIAGNOSIS — R1031 Right lower quadrant pain: Secondary | ICD-10-CM | POA: Diagnosis not present

## 2023-06-05 DIAGNOSIS — K5790 Diverticulosis of intestine, part unspecified, without perforation or abscess without bleeding: Secondary | ICD-10-CM | POA: Diagnosis not present

## 2023-06-05 DIAGNOSIS — G3184 Mild cognitive impairment, so stated: Secondary | ICD-10-CM | POA: Diagnosis not present

## 2023-06-05 DIAGNOSIS — G119 Hereditary ataxia, unspecified: Secondary | ICD-10-CM | POA: Diagnosis not present

## 2023-06-06 ENCOUNTER — Other Ambulatory Visit (HOSPITAL_COMMUNITY): Payer: Self-pay | Admitting: Nurse Practitioner

## 2023-06-06 ENCOUNTER — Ambulatory Visit (HOSPITAL_COMMUNITY)
Admission: RE | Admit: 2023-06-06 | Discharge: 2023-06-06 | Disposition: A | Discharge status: Home / Self Care | Payer: HMO | Source: Ambulatory Visit | Attending: Nurse Practitioner

## 2023-06-06 DIAGNOSIS — R52 Pain, unspecified: Secondary | ICD-10-CM | POA: Diagnosis not present

## 2023-06-16 ENCOUNTER — Telehealth: Payer: Self-pay | Admitting: Internal Medicine

## 2023-06-16 DIAGNOSIS — Z85828 Personal history of other malignant neoplasm of skin: Secondary | ICD-10-CM | POA: Diagnosis not present

## 2023-06-16 DIAGNOSIS — L111 Transient acantholytic dermatosis [Grover]: Secondary | ICD-10-CM | POA: Diagnosis not present

## 2023-06-16 DIAGNOSIS — L309 Dermatitis, unspecified: Secondary | ICD-10-CM | POA: Diagnosis not present

## 2023-06-16 MED ORDER — AMLODIPINE BESYLATE 2.5 MG PO TABS: 2.5000 mg | ORAL_TABLET | Freq: Two times a day (BID) | ORAL | 2 refills | Status: DC

## 2023-06-16 NOTE — Telephone Encounter (Signed)
*  STAT* If patient is at the pharmacy, call can be transferred to refill team.   1. Which medications need to be refilled? (please list name of each medication and dose if known)   amLODipine (NORVASC) 2.5 MG tablet   2. Would you like to learn more about the convenience, safety, & potential cost savings by using the [facility]?   3. Are you open to using the [facility] (Type [facility]. ).  4. Which pharmacy/location (including street and city if local pharmacy) is medication to be sent to?  [id number] - [place], [place] - [address] AT [address]   5. Do they need a 30 day or 90 day supply?   90 day  Wife Darl Pikes) stated patient only has a few tablets left.

## 2023-06-16 NOTE — Telephone Encounter (Signed)
Pt's medication was sent to pt's pharmacy as requested. Confirmation received.

## 2023-06-26 ENCOUNTER — Encounter: Payer: Self-pay | Admitting: Neurology

## 2023-06-26 ENCOUNTER — Ambulatory Visit: Payer: HMO | Admitting: Neurology

## 2023-06-26 VITALS — BP 144/62 | HR 55 | Ht 70.0 in | Wt 178.0 lb

## 2023-06-26 DIAGNOSIS — G118 Other hereditary ataxias: Secondary | ICD-10-CM | POA: Diagnosis not present

## 2023-06-26 DIAGNOSIS — R1310 Dysphagia, unspecified: Secondary | ICD-10-CM | POA: Diagnosis not present

## 2023-06-26 DIAGNOSIS — R4781 Slurred speech: Secondary | ICD-10-CM

## 2023-06-26 DIAGNOSIS — R269 Unspecified abnormalities of gait and mobility: Secondary | ICD-10-CM

## 2023-06-26 NOTE — Progress Notes (Signed)
 [facility]  PATIENT: Derrick White DOB: 1940/09/10  REFERRING DOCTOR OR PCP:  [name] Reas SOURCE: patient, records in EPIC from [facility], MRI results, EMG/NCV results  _________________________________   HISTORICAL  CHIEF COMPLAINT:  Chief Complaint  Patient presents with   Room 11    Pt is here Alone. Pt states that he has been doing good. Pt states that his Ataxia has increased some, but other that than that he is up and mobile.     HISTORY OF PRESENT ILLNESS:  Derrick White is a 83 yo man with left ankle and foot pain.    Update  06/26/2023 He feels his symptoms are mostly stable.  Balance is poor and he uses a rolling walker outside but walls/furniture or cane indoors.  He has no recent falls but has a lot of stumbles.   He has difficulty with slurred speech and swallowing.  Handwriting is poor abut able to feed himself. His family is still a walk in tub with grab bars.   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  Speech is slurred .  He has dysphagia.   He has had a swallow study for dysphagia.  He tries to eat a normal diet.  His two children and 5 grandkids show no symptoms.  He was +/- 72 at onset and oldest child's mid 73s.   We did the Athena SCA panel (30+ genes).   It was borderline and difficult to interpret with certainty.  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   The same Kristan but with other mutations can also cause episodic hemiplegic migraine and episodic ataxia.  He also has ataxia in the hands that affect handwriting mor ethan eating utensils.  NCV/EMG study did not show any evidence of motor neuron disease.   He does not have any difficulties with diplopia or ptosis.  MRI did not show  any source of these bulbar symptoms.  The extent of atrophy in the cerebellum was minimal.  Mild generalized atrophy and ventricles slightly larger than expected for extent of atrophy  Only mild ETOH use in past.  He has not been exposed to Dilantin or antineoplastic agents.     History:   He broke his left ankle in 2012 or 2013 requiring plate and screws.   He later had the hardware removed without benefit.    A year leater, he had surgery to decompress the peroneal nerve.    He was referred to [facility] and had the left peroneal neurectomy and removal of  L3L4 Morton's Neuroma and L2L3 intermetatarsal nerve decompression procedures.    He was tried on Lyrica but felt very off balanced and mentally slowed so he stopped.  He had mild benefit from lidocaine  containing ointments. A couple different types of ointments were tried and the composition of all of them is unknown.     Balance issues may have started in 2012 but progressed very mildly .  Muh mlre progression noted between 2022 and 2023 visits.    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  There was moderate chronic microvascular ischemic change.  MR angiogram that day was normal.  DATA:  NCV/EMG study 05/04/2019 showed the following: 1.   Chronic right C6 radiculopathy.  There could also be milder C5 and C7 chronic radiculopathies. 2.   Chronic left S1 radiculopathy. 3.   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   There was no evidence of polyneuropathy, motor neuron disease or myopathy.  Laboratory data  05/04/2019: CK, acetylcholine receptor antibodies, anti-GM 1 IgG were normal or negative. 09/16/2019: Vitamin B12, ANA, vitamin B1, antigad, RPR were negative or normal.  Celiac panel was borderline.  REVIEW OF SYSTEMS: Constitutional: No fevers, chills, sweats, or change in appetite Eyes: No visual  changes, double vision, eye pain Ear, nose and throat: No hearing loss, ear pain, nasal congestion, sore throat Cardiovascular: No chest pain, palpitations Respiratory:  No shortness of breath at rest or with exertion.   No wheezes GastrointestinaI: No nausea, vomiting, diarrhea, abdominal pain, fecal incontinence.   Has GERD Genitourinary:  No dysuria, urinary retention or frequency.  No nocturia. Musculoskeletal:  No current neck pain, back pain Integumentary: No rash, pruritus, skin lesions Neurological: as above Psychiatric: No depression at this time.  No anxiety Endocrine: No palpitations, diaphoresis, change in appetite, change in weigh or increased thirst Hematologic/Lymphatic:  No anemia, purpura, petechiae. Allergic/Immunologic: No itchy/runny eyes, nasal congestion, recent allergic reactions, rashes  ALLERGIES: Allergies  Allergen Reactions   Morphine Hives and Itching   Codeine Hives and Itching    Other reaction(s): bad feeling   Propoxyphene Itching    DARVOCET   Shrimp [Shellfish Allergy] Rash    HOME MEDICATIONS:  Current Outpatient Medications:    acetaminophen  (TYLENOL ) 500 MG tablet, Take 500 mg by mouth every 6 (six) hours as needed for moderate pain or headache., Disp: , Rfl:    amiodarone  (PACERONE ) 200 MG tablet, Take 1 tablet (200 mg total) by mouth daily., Disp: 90 tablet, Rfl: 0   amLODipine  (NORVASC ) 2.5 MG tablet, Take 1 tablet (2.5 mg total) by mouth 2 (two) times daily., Disp: 180 tablet, Rfl: 2   aspirin  EC 81 MG tablet, Take 1 tablet (81 mg total) by mouth daily. Swallow whole., Disp: 90 tablet, Rfl: 3   atorvastatin  (LIPITOR) 40 MG tablet, Take 1 tablet (40 mg total) by mouth daily., Disp: 90 tablet, Rfl: 3   Cholecalciferol 25 MCG (1000 UT) tablet, Take 1,000 Units by mouth at bedtime., Disp: , Rfl:    diphenhydramine-acetaminophen  (TYLENOL  PM) 25-500 MG TABS tablet, Take 1 tablet by mouth at bedtime as needed (sleep)., Disp: , Rfl:    fluticasone   (FLONASE ) 50 MCG/ACT nasal spray, Place 1 spray into both nostrils daily as needed for allergies or rhinitis., Disp: , Rfl:    pantoprazole  (PROTONIX ) 40 MG tablet, Take 40 mg by mouth in the morning., Disp: , Rfl: 2   Probiotic Product (ALIGN) 4 MG CAPS, Take by mouth in the morning., Disp: , Rfl:    Propylene Glycol (SYSTANE BALANCE) 0.6 % SOLN, Place 1 drop into both eyes 2 (two) times daily as needed (burning)., Disp: , Rfl:    vitamin B-12 (CYANOCOBALAMIN) 1000 MCG tablet, Take 1,000 mcg by mouth at bedtime., Disp: , Rfl:    vitamin E 180 MG (400 UNITS) capsule, Take 400 Units by mouth at bedtime., Disp: , Rfl:   PAST MEDICAL HISTORY: 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   Cerebellar ataxia (HCC)    Dysesthesia 06/04/2016   Essential hypertension 11/05/2019  Foot pain, left 06/04/2016   Gait disturbance 03/16/2019   History of cervical spinal surgery 06/04/2016   History of hiatal hernia    History of kidney stones    Hypertension    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   Memory loss 03/16/2019   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   Pain due to onychomycosis of toenails of both feet 04/28/2019   Pain in left ankle and joints of left foot 08/06/2018   Slurred speech 03/16/2019   Superficial peroneal nerve neuropathy, 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   TIA (transient ischemic attack) 11/05/2019   Worsened handwriting 03/16/2019    PAST SURGICAL HISTORY: See above  FAMILY HISTORY: Family History  Problem Relation Age of Onset   Stroke Neg Hx     SOCIAL HISTORY:  Social History   Socioeconomic History   Marital status: Married    Spouse name: Not on file   Number of children: Not on file   Years of education: Not on file   Highest education level: Not on file  Occupational History   Occupation: retired  Tobacco Use   Smoking status: Former    Current packs/day: 0.00    Average packs/day: 1 pack/day for 30.0 years (30.0 ttl pk-yrs)    Types:  Cigarettes    Start date: 80    Quit date: 1995    Years since quitting: 30.1   Smokeless tobacco: Never  Vaping Use   Vaping status: Never Used  Substance and Sexual Activity   Alcohol  use: Yes    Alcohol /week: 16.0 standard drinks of alcohol     Types: 14 Cans of beer, 2 Standard drinks or equivalent per week    Comment: 2 beers a day   Drug use: Never   Sexual activity: Not on file  Other Topics Concern   Not on file  Social History Narrative   Not on file   Social Drivers of Health   Financial Resource Strain: Not on file  Food Insecurity: Not on file  Transportation Needs: Not on file  Physical Activity: Not on file  Stress: Not on file  Social Connections: Not on file  Intimate Partner Violence: Not on file     PHYSICAL EXAM  Vitals:   06/26/23 1103  BP: (!) 144/62  Pulse: (!) 55  Weight: 178 lb (80.7 kg)  Height: 5' 10 (1.778 m)    Body mass index is 25.54 kg/m.   General: The patient is well-developed and well-nourished and in no acute distress    Skin/Ext: Extremities are without rash or edema.   He has well-healed surgical scars on the left foot.  These are near the sural nerve.    Neurologic Exam  Mental status: He appears to have reduced focus and attention.  Has no trouble following commands.   No aphasia  Cranial nerves: Extraocular movements are full.  Facial strength is normal.  His voice is slurred/ataxic.   Palatal elevation and tongue protrusion are midline.   No obvious hearing deficits are noted.  Motor:  Muscle bulk is normal.   Tone is normal. Strength is  5/5 in legs/feet including EHL   Sensory:   He has mildly reduced sensation in the left foot, in the distribution of the left superficial peroneal nerve.  Mildly reduced vibration sensation in ankles and toes.   Coordination: Cerebellar testing reveals reduced  finger-nose-finger and poor heel-to-shin bilaterally.  Gait and station: Station is normal.  The gait is ataxic.  He can  take some steps in room  independently but needs unilat support to safely turn.    He has a good stride with his walker.  Reflexes: Deep tendon reflexes are symmetric and 3 at knees and 1 at ankles.       DIAGNOSTIC DATA (LABS, IMAGING, TESTING) - I reviewed patient records, labs, notes, testing and imaging myself where available.  Lab Results  Component Value Date   WBC 7.0 10/31/2020   HGB 14.8 10/31/2020   HCT 45.7 10/31/2020   MCV 98.5 10/31/2020   PLT 336 10/31/2020      Component Value Date/Time   NA 136 02/28/2023 1118   K 4.5 02/28/2023 1118   CL 101 02/28/2023 1118   CO2 24 02/28/2023 1118   GLUCOSE 78 02/28/2023 1118   GLUCOSE 99 10/31/2020 1119   BUN 19 02/28/2023 1118   CREATININE 1.14 02/28/2023 1118   CALCIUM  9.6 02/28/2023 1118   PROT 7.3 10/01/2022 1037   ALBUMIN 4.4 10/01/2022 1037   AST 22 10/01/2022 1037   ALT 39 10/01/2022 1037   ALKPHOS 54 10/01/2022 1037   BILITOT 0.5 10/01/2022 1037   GFRNONAA >60 10/31/2020 1119   GFRAA 81 07/12/2020 1309       ASSESSMENT AND PLAN  Spinocerebellar ataxia (HCC) - Plan: Ambulatory referral to Physical Therapy  Gait disturbance - Plan: Ambulatory referral to Physical Therapy  Slurred speech  Dysphagia, unspecified type   1.   He has a progressive cerebellar ataxia (likely related to CACNA1a missense mutaiton - though he does not have the well-recognized triplet repeat mutation in this Dedrick seen in SCA 6 ).  His onset was late.  Neither of his children have symptoms and they are in the mid to late 50s 2.   Stay active and exercise as tolerated.   Refer to PT for gait disturbance. 3.   We discussed safety -- use grab bars or shower chair in shower.   He will return to see me as needed if there are new or worsening neurologic symptoms..  This visit is part of a comprehensive longitudinal care medical relationship regarding the patients primary diagnosis of SCA and related concerns.   Delailah Spieth A. Vear, MD,  PhD 06/26/2023, 11:21 AM Certified in Neurology, Clinical Neurophysiology, Sleep Medicine, Pain Medicine and Neuroimaging  [facility] [address] [place], [place] [address] [phone]

## 2023-07-08 DIAGNOSIS — L309 Dermatitis, unspecified: Secondary | ICD-10-CM | POA: Diagnosis not present

## 2023-07-08 DIAGNOSIS — D692 Other nonthrombocytopenic purpura: Secondary | ICD-10-CM | POA: Diagnosis not present

## 2023-07-08 DIAGNOSIS — Z85828 Personal history of other malignant neoplasm of skin: Secondary | ICD-10-CM | POA: Diagnosis not present

## 2023-07-08 DIAGNOSIS — L570 Actinic keratosis: Secondary | ICD-10-CM | POA: Diagnosis not present

## 2023-08-11 ENCOUNTER — Ambulatory Visit: Attending: Neurology | Admitting: Physical Therapy

## 2023-08-11 ENCOUNTER — Encounter: Payer: Self-pay | Admitting: Physical Therapy

## 2023-08-11 DIAGNOSIS — R269 Unspecified abnormalities of gait and mobility: Secondary | ICD-10-CM | POA: Diagnosis not present

## 2023-08-11 DIAGNOSIS — R2689 Other abnormalities of gait and mobility: Secondary | ICD-10-CM | POA: Insufficient documentation

## 2023-08-11 DIAGNOSIS — R278 Other lack of coordination: Secondary | ICD-10-CM | POA: Insufficient documentation

## 2023-08-11 DIAGNOSIS — G118 Other hereditary ataxias: Secondary | ICD-10-CM | POA: Diagnosis not present

## 2023-08-11 DIAGNOSIS — R2681 Unsteadiness on feet: Secondary | ICD-10-CM | POA: Diagnosis not present

## 2023-08-11 NOTE — Patient Instructions (Signed)
   Aquatic Therapy: What to Expect!  Where:  [facility] [address] [place], [place]  [address] [phone]  NOTE:  You will receive an automated phone message reminding you of your appointment and it will say the appointment is at the [facility] on [address]  We are working to fix this- just know that you will meet [place] at the pool!  How to Prepare: Please make sure you drink 8 ounces of water about one hour prior to your pool session A caregiver MUST attend the entire session with the patient.  The caregiver will be responsible for assisting with dressing as well as any toileting needs.  If the patient will be doing a home program this should likely be the person who will assist as well.  Patients must wear either their street shoes or pool shoes until they are ready to enter the pool with the therapist.  Patients must also wear either street shoes or pool shoes once exiting the pool to walk to the locker room.  This will helps [place] prevent slips and falls.  Please arrive 15 minutes early to prepare for your pool therapy session Sign in at the front desk on the clipboard marked for [place] You may use the locker rooms on your right and then enter directly into the recreation pool (NOT the competition pool) Please make sure to attend to any toileting needs prior to entering the pool Please be dressed in your swim suit and on the pool deck at least 5 minutes before your appointment Once on the pool deck your therapist will ask you to sign the Patient  Consent and Assignment of Benefits form Your therapist may take your blood pressure prior to, during and after your session if indicated  About the pool  and parking: Entering the pool Your therapist will assist you; there are 2 ways to enter:  stairs with railings or with a chair lift.   Your therapist will determine the most appropriate way for you. Water temperature is usually between 86-87 degrees There  may be other swimmers in the pool at the same time Parking is free.   Contact Info:     Appointments: [facility]  All sessions are 45 minutes   [address]  [address]     Please call the [facility] if   [place], [place]   [address]    you need to cancel or reschedule an appointment.  [phone]

## 2023-08-11 NOTE — Therapy (Unsigned)
 OUTPATIENT PHYSICAL THERAPY NEURO EVALUATION   Patient Name: Derrick White MRN: [id number] DOB:1940/09/15, 83 y.o., male 22 Date: 08/11/2023   PCP: Gaspar Garbe., MD REFERRING PROVIDER: Asa Lente, MD  END OF SESSION:  PT End of Session - 08/11/23 1957     Visit Number 1    Number of Visits 9    Date for PT Re-Evaluation 09/12/23    Authorization Type [employer]    Authorization Time Period 08-11-23 - 10-11-23    PT Start Time 1018    PT Stop Time 1102    PT Time Calculation (min) 44 min    Equipment Utilized During Treatment Gait belt    Activity Tolerance Patient tolerated treatment well    Behavior During Therapy 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   Memory loss 03/16/2019   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   Pain due to onychomycosis of toenails of both feet 04/28/2019   Pain in left ankle and joints of left foot 08/06/2018   Slurred speech 03/16/2019   Superficial peroneal nerve neuropathy, 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   TIA (transient ischemic attack) 11/05/2019   Worsened handwriting 03/16/2019   Past Surgical History:  Procedure Laterality Date   APPENDECTOMY     1940s   BACK SURGERY     1981, 1998   CARDIOVERSION N/A 09/13/2020   Procedure: CARDIOVERSION;  Surgeon: Thurmon Fair, MD;  Location: [facility] ENDOSCOPY;  Service: Cardiovascular;  Laterality: N/A;   FRACTURE SURGERY     Left ankle, sx x4   INGUINAL HERNIA REPAIR Right 03/05/2019   Procedure: OPEN RIGHT INGUINAL HERNIA REPAIR WITH MESH;  Surgeon: Berna Bue, MD;  Location: [facility] OR;  Service:  General;  Laterality: Right;   KNEE SURGERY Left 1985   LEFT ATRIAL APPENDAGE OCCLUSION N/A 07/27/2020   Procedure: LEFT ATRIAL APPENDAGE OCCLUSION;  Surgeon: Tonny Bollman, MD;  [facility]: [facility] INVASIVE CV LAB;  Service: Cardiovascular;  Laterality: N/A;   SHOULDER SURGERY Left    TEE WITHOUT CARDIOVERSION N/A 07/27/2020   Procedure: TRANSESOPHAGEAL ECHOCARDIOGRAM (TEE);  Surgeon: Tonny Bollman, MD;  Location: [facility] INVASIVE CV LAB;  Service: Cardiovascular;  Laterality: N/A;   TEE WITHOUT CARDIOVERSION N/A 09/13/2020   Procedure: TRANSESOPHAGEAL ECHOCARDIOGRAM (TEE);  Surgeon: Thurmon Fair, MD;  Location: [facility] ENDOSCOPY;  Service: Cardiovascular;  Laterality: N/A;   TONSILLECTOMY     1945   Patient Active Problem List   Diagnosis Date Noted   Pain in right shoulder 07/31/2021   Persistent atrial fibrillation (HCC) 08/28/2020   Secondary hypercoagulable state (HCC) 07/20/2020   Paroxysmal atrial fibrillation (HCC) 06/19/2020   TIA (transient ischemic attack) 11/05/2019   Essential hypertension 11/05/2019   Cerebellar ataxia (HCC) 11/05/2019   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   C6 radiculopathy 05/04/2019   Pain due to onychomycosis of toenails of both feet 04/28/2019   Worsened handwriting 03/16/2019   Gait disturbance 03/16/2019   Slurred speech 03/16/2019   Memory loss 03/16/2019   Pain in left ankle and joints of left foot 08/06/2018   Mild  cognitive impairment 12/12/2017   Foot pain, left 06/04/2016   Dysesthesia 06/04/2016   Superficial peroneal nerve neuropathy, 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   History of cervical spinal surgery 06/04/2016    ONSET DATE: Referral date 06-26-23  REFERRING DIAG: Diagnosis G11.8 (ICD-10-CM) - Spinocerebellar ataxia (HCC) R26.9 (ICD-10-CM) - Gait disturbance  THERAPY DIAG:  Other abnormalities of gait and mobility  Unsteadiness on feet  Other lack of coordination  Rationale for Evaluation and Treatment: Rehabilitation  SUBJECTIVE:                                                                                                                                                                                              SUBJECTIVE STATEMENT:  Pt has spinocerebellar ataxia which was diagnosed several years ago;  pt has had PT at this clinic in past 2 yrs  (from 02-08-22 - 05-02-22,  07-18-22 - 08-14-22 for 5 visits and then 6 visits at [facility] from 01-30-23 - 03-03-23); pt states that exercise is the only thing that will help him  Per chart note:  "Update  06/26/2023" from Dr. Epimenio Foot He feels his symptoms are mostly stable.  Balance is poor and he uses a rolling walker outside but walls/furniture or cane indoors.  He has no recent falls but has a lot of stumbles.   He has difficulty with slurred speech and swallowing.  Handwriting is poor abut able to feed himself. His family is still a walk in tub with grab bars.   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 NCV/EMG study did not show any evidence of motor neuron disease. We did the Athena SCA panel (30+ genes). It was borderline. 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 Unclear if this is  playing a role (SCA type 6 has mutations in this Mahir but usually triplet repeat)      PAIN:  Are you having pain? No  PRECAUTIONS: Fall  RED FLAGS: None   WEIGHT BEARING RESTRICTIONS: No  FALLS: Has patient fallen in last 6 months? No  LIVING ENVIRONMENT: Lives with: lives with their spouse Lives in: House/apartment Stairs: Yes: Internal: 12 steps; can reach both and External: 2 steps; can reach both - in back; pt reports he does not access 2nd level of home - has bedroom on lower level Has following equipment at home: Dan Humphreys - 4 wheeled, walking canes  PLOF: Independent with basic ADLs, Independent with household mobility with device, Independent with community mobility with device, and Needs assistance with homemaking  PATIENT GOALS: get around better and not fall - "stay out of a wheelchair"  OBJECTIVE:  Note: Objective measures were completed at Evaluation unless otherwise noted.  DIAGNOSTIC FINDINGS: N/A (none recently)  COGNITION: Overall cognitive status: Within functional limits for tasks assessed   SENSATION: WFL  COORDINATION: Decreased bil. LE's due to ataxia  POSTURE: rounded shoulders and forward head  LOWER EXTREMITY ROM:   WFL's bil. LE's   LOWER EXTREMITY MMT:    MMT Right Eval Left Eval  Hip flexion 5 5  Hip extension    Hip abduction    Hip adduction    Hip internal rotation    Hip external rotation    Knee flexion 5 5  Knee extension 5 5  Ankle dorsiflexion 5 5  Ankle plantarflexion    Ankle inversion    Ankle eversion    (Blank rows = not tested)  BED MOBILITY:  Modified independent  TRANSFERS: Assistive device utilized: Environmental consultant - 4 wheeled  Sit to stand: Modified independence Stand to sit: Modified independence  GAIT: Gait pattern: {gait characteristics:25376} Distance walked: *** Assistive device utilized: {Assistive devices:23999} Level of assistance: {Levels of assistance:24026} Comments: ***  FUNCTIONAL TESTS:  5  times sit to stand: 13.57 secs without UE support from mat Timed up and go (TUG): 24.12 secs 10 meter walk test: 18.19                                                                                                                              TREATMENT DATE: 08-11-23    PATIENT EDUCATION: Education details: *** Person educated: {Person educated:25204} Education method: {Education Method:25205} Education comprehension: {Education Comprehension:25206}  HOME EXERCISE PROGRAM: ***  GOALS: Goals reviewed with patient? {yes/no:20286}  SHORT TERM GOALS: Target date: ***  *** Baseline: Goal status: INITIAL  2.  *** Baseline:  Goal status: INITIAL  3.  *** Baseline:  Goal status: INITIAL  4.  *** Baseline:  Goal status: INITIAL  5.  *** Baseline:  Goal status: INITIAL  6.  *** Baseline:  Goal status: INITIAL  LONG TERM GOALS: Target date: ***  *** Baseline:  Goal status: INITIAL  2.  *** Baseline:  Goal status: INITIAL  3.  *** Baseline:  Goal status: INITIAL  4.  *** Baseline:  Goal status: INITIAL  5.  *** Baseline:  Goal status:  INITIAL  6.  *** Baseline:  Goal status: INITIAL  ASSESSMENT:  CLINICAL IMPRESSION: Patient is a *** y.o. *** who was seen today for physical therapy evaluation and treatment for ***.   OBJECTIVE IMPAIRMENTS: {opptimpairments:25111}.   ACTIVITY LIMITATIONS: {activitylimitations:27494}  PARTICIPATION LIMITATIONS: {participationrestrictions:25113}  PERSONAL FACTORS: {Personal factors:25162} are also affecting patient's functional outcome.   REHAB POTENTIAL: {rehabpotential:25112}  CLINICAL DECISION MAKING: {clinical decision making:25114}  EVALUATION COMPLEXITY: {Evaluation complexity:25115}  PLAN:  PT FREQUENCY: {rehab frequency:25116}  PT DURATION: {rehab duration:25117}  PLANNED INTERVENTIONS: {rehab planned interventions:25118::"97110-Therapeutic exercises","97530- Therapeutic [id number]-  Neuromuscular re-education","97535- Self HQIO","96295- Manual therapy"}  PLAN FOR NEXT SESSION: ***   Kary Kos, PT 08/11/2023, 8:00 PM

## 2023-08-13 ENCOUNTER — Ambulatory Visit: Admitting: Physical Therapy

## 2023-08-13 DIAGNOSIS — R2681 Unsteadiness on feet: Secondary | ICD-10-CM

## 2023-08-13 DIAGNOSIS — R2689 Other abnormalities of gait and mobility: Secondary | ICD-10-CM | POA: Diagnosis not present

## 2023-08-13 DIAGNOSIS — R278 Other lack of coordination: Secondary | ICD-10-CM

## 2023-08-14 ENCOUNTER — Encounter: Payer: Self-pay | Admitting: Physical Therapy

## 2023-08-14 NOTE — Therapy (Signed)
 OUTPATIENT PHYSICAL THERAPY NEURO EVALUATION   Patient Name: Derrick White MRN: [id number] DOB:1940-11-12, 83 y.o., male 83 Date: 08/14/2023   PCP: Gaspar Garbe., MD REFERRING PROVIDER: Asa Lente, MD  END OF SESSION:  PT End of Session - 08/14/23 0954     Visit Number 2    Number of Visits 9    Date for PT Re-Evaluation 09/12/23    Authorization Type [employer]    Authorization Time Period 08-11-23 - 10-11-23    PT Start Time 1400    PT Stop Time 1455    PT Time Calculation (min) 55 min    Equipment Utilized During Treatment Other (comment)   barbells, pool noodle   Activity Tolerance Patient tolerated treatment well    Behavior During Therapy [facility] for tasks assessed/performed             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   Memory loss 03/16/2019   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   Pain due to onychomycosis of toenails of both feet 04/28/2019   Pain in left ankle and joints of left foot 08/06/2018   Slurred speech 03/16/2019   Superficial peroneal nerve neuropathy, 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   TIA (transient ischemic attack) 11/05/2019   Worsened handwriting 03/16/2019   Past Surgical History:  Procedure Laterality Date   APPENDECTOMY     1940s   BACK SURGERY     1981, 1998   CARDIOVERSION N/A 09/13/2020   Procedure: CARDIOVERSION;  Surgeon: Thurmon Fair, MD;  Location: [facility] ENDOSCOPY;  Service: Cardiovascular;  Laterality: N/A;   FRACTURE SURGERY     Left ankle, sx x4   INGUINAL HERNIA REPAIR Right 03/05/2019   Procedure: OPEN RIGHT INGUINAL HERNIA REPAIR WITH MESH;  Surgeon: Berna Bue, MD;   Location: [facility] OR;  Service: General;  Laterality: Right;   KNEE SURGERY Left 1985   LEFT ATRIAL APPENDAGE OCCLUSION N/A 07/27/2020   Procedure: LEFT ATRIAL APPENDAGE OCCLUSION;  Surgeon: Tonny Bollman, MD;  Location: [facility] INVASIVE CV LAB;  Service: Cardiovascular;  Laterality: N/A;   SHOULDER SURGERY Left    TEE WITHOUT CARDIOVERSION N/A 07/27/2020   Procedure: TRANSESOPHAGEAL ECHOCARDIOGRAM (TEE);  Surgeon: Tonny Bollman, MD;  Location: [facility] INVASIVE CV LAB;  Service: Cardiovascular;  Laterality: N/A;   TEE WITHOUT CARDIOVERSION N/A 09/13/2020   Procedure: TRANSESOPHAGEAL ECHOCARDIOGRAM (TEE);  Surgeon: Thurmon Fair, MD;  Location: [facility] ENDOSCOPY;  Service: Cardiovascular;  Laterality: N/A;   TONSILLECTOMY     1945   Patient Active Problem List   Diagnosis Date Noted   Pain in right shoulder 07/31/2021   Persistent atrial fibrillation (HCC) 08/28/2020   Secondary hypercoagulable state (HCC) 07/20/2020   Paroxysmal atrial fibrillation (HCC) 06/19/2020   TIA (transient ischemic attack) 11/05/2019   Essential hypertension 11/05/2019   Cerebellar ataxia (HCC) 11/05/2019   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   C6 radiculopathy 05/04/2019   Pain due to onychomycosis of toenails of both feet 04/28/2019   Worsened handwriting 03/16/2019   Gait disturbance 03/16/2019   Slurred speech 03/16/2019   Memory loss 03/16/2019   Pain in left ankle and joints of left foot  08/06/2018   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   Foot pain, left 06/04/2016   Dysesthesia 06/04/2016   Superficial peroneal nerve neuropathy, 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   History of cervical spinal surgery 06/04/2016    ONSET DATE: Referral date 06-26-23  REFERRING DIAG: Diagnosis G11.8 (ICD-10-CM) - Spinocerebellar ataxia (HCC) R26.9 (ICD-10-CM) - Gait disturbance  THERAPY DIAG:  Other abnormalities of gait and mobility  Unsteadiness on feet  Other lack of coordination  Rationale for Evaluation and Treatment:  Rehabilitation  SUBJECTIVE:                                                                                                                                                                                             SUBJECTIVE STATEMENT:  Pt presents for initial aquatic PT session at [facility] - denies any changes or problems since initial eval on Monday; is looking forward to trying exercises in the pool   Per chart note:  "Update  06/26/2023" from Dr. Epimenio Foot He feels his symptoms are mostly stable.  Balance is poor and he uses a rolling walker outside but walls/furniture or cane indoors.  He has no recent falls but has a lot of stumbles.   He has difficulty with slurred speech and swallowing.  Handwriting is poor abut able to feed himself. His family is still a walk in tub with grab bars.   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 NCV/EMG study did not show any evidence of motor neuron disease. We did the [employer] SCA panel (30+ genes). It was borderline. 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 No  PRECAUTIONS: Fall  RED FLAGS: None   WEIGHT BEARING RESTRICTIONS: No  FALLS: Has patient fallen in last 6 months? No  LIVING ENVIRONMENT: Lives with: lives with their spouse Lives in: House/apartment Stairs: Yes: Internal: 12 steps; can reach both and External: 2 steps; can reach both - in back; pt reports he does not access 2nd level of home - has bedroom on lower level Has following equipment at home: Dan Humphreys - 4 wheeled, walking canes  PLOF: Independent with basic ADLs, Independent with household mobility with device, Independent with community mobility with device, and Needs assistance with homemaking  PATIENT GOALS: get around better and not fall - "stay out of a wheelchair"  OBJECTIVE:  Note: Objective measures were completed at Evaluation unless otherwise noted.  DIAGNOSTIC FINDINGS: N/A (none recently)  COGNITION: Overall cognitive status: Within functional limits for tasks assessed   SENSATION: WFL  COORDINATION: Decreased bil. LE's due to ataxia  POSTURE: rounded shoulders and forward head  LOWER EXTREMITY ROM:   WFL's bil. LE's   LOWER EXTREMITY MMT:    MMT Right Eval Left Eval  Hip flexion 5 5  Hip extension    Hip abduction    Hip adduction    Hip internal rotation    Hip external rotation    Knee flexion 5 5  Knee extension 5 5  Ankle dorsiflexion 5 5  Ankle plantarflexion    Ankle inversion    Ankle eversion    (Blank rows = not tested)  BED MOBILITY:  Modified independent  TRANSFERS: Assistive device utilized: Environmental consultant - 4 wheeled  Sit to stand: Modified independence Stand to sit: Modified independence  GAIT: Gait pattern: ataxic Distance walked: 100' Assistive device utilized: Environmental consultant - 4 wheeled Level of assistance: SBA Comments: needs cues for correct hand placement for safety with sit > stand transfers  FUNCTIONAL TESTS:  5 times sit to stand: 13.57 secs without UE support from mat Timed  up and go (TUG): 24.12 secs 10 meter walk test: 18.19  secs = 1.80 ft/sec with RW                                                                                                                              TREATMENT DATE: 08-14-23   Patient seen for aquatic therapy today.  Treatment took place in water 3.6 - 4.5 feet deep depending upon activity.  Pt entered the pool via step negotiation with use of hand rails with min assist due to LOB during step descension; CGA for safety with step ascension for exiting the pool, using handrails and step by step sequence.  Pt performed water walking for warm up - forwards, backwards and sideways 18' x 4 reps each direction across width of pool - holding onto noodle for UE support  Marching in place 10 reps each leg with UE support on pool edge for assist with balance; marching forwards across pool 18' x 4 reps with UE support on pool noodle with min assist for stabilizing noodle for UE support  Core stabilization exercises; pt stood statically with feet shoulder width apart -  moved UE's forward/back 10 reps; shoulder horizontal abduction/adduction 10 reps   -pt performed stepping forward back pushing barbells forward/back 10 reps - RLE and LLE       Performed stepping laterally (out/in) 10 reps each leg/moving barbells out to side in horizontal abdct/adduction 10 reps each leg       Performed stepping back/forward 10 reps each leg - little UE movement with barbells  Balance exercises - pt performed alternating forward, side and backward kicks 10 reps each leg   Pt stood with feet together - cues to tighten core - with UE support on pool noodle  Pt performed step ups onto aquatic step in 4.5' water depth - 10 reps each leg; stepped up/stepped down/ turned 180 degrees around to repeat sequence - 5 reps Pt performed tap ups alternating feet to aquatic step 10 reps with UE support on pool edge to assist with balance  Pt requires buoyancy of water for support  for reduced fall risk with balance exercises and activities as pt able to safely attempt and perform exercises in water compared to fall risk factor with performing same exercises on land.  Viscosity of water is needed for resistance for strengthening and current of water provides perturbations for challenge for balance training.  Pt is able to safely perform ambulation without device in water which is unable to be attempted safely on land.     PATIENT EDUCATION: Education details: Eval results with recommended POC: discussed aquatic therapy and provided information/handout as pt is interested in this service Person educated: Patient Education method: Explanation and Handouts Education comprehension: verbalized understanding  HOME EXERCISE PROGRAM: To be established  GOALS: Goals reviewed with patient? Yes  SHORT TERM GOALS: SAME AS LTG'S   LONG TERM GOALS: Target date: 09-12-23  Improve 5x sit to stand score to </= 11 secs without UE support from mat table. Baseline: 13.57 secs without UE support Goal status: INITIAL  2.  Improve Timed Up and Go score to </= 20 secs with use of RW to demo improved mobility and reduced fall risk. Baseline:  24.12 secs with RW - incorrect hand placement Goal status: INITIAL  3.  Improve gait velocity to >/= 2.0 ft/sec with RW for increased gait efficiency. Baseline:  18.19 secs = 1.80 ft/sec Goal status: INITIAL  4.  Pt will participate in aquatic therapy session and determine if this would be a preferred ongoing exercise program upon D/C.  Baseline:  Goal status: INITIAL  5.  Independent in HEP for land based balance exercises (and for aquatic exercises if pt requests to continue aquatic exercise upon D/C). Baseline:  Goal status: INITIAL  ASSESSMENT:  CLINICAL IMPRESSION: Aquatic PT session focused on water walking, core stabilization and balance exercises.  Pt able to ambulate in various directions to challenge dynamic standing balance with  use of noodle for UE support for assist with balance.  Pt demonstrated decreased core stabilization with moderate movement of body occurring with exercises requiring static standing.  Pt also has decreased SLS on each leg and decreased coordination due to ataxia.  Pt tolerated aquatic exercises well with very few rest breaks needed.  Cont with POC.   OBJECTIVE IMPAIRMENTS: Abnormal gait, decreased balance, decreased coordination, and decreased safety awareness.   ACTIVITY LIMITATIONS: carrying, bending, squatting, and locomotion level  PARTICIPATION LIMITATIONS: shopping, community activity, and yard work  PERSONAL FACTORS: Time since onset of injury/illness/exacerbation and 1 comorbidity: cerebellar ataxia  are also affecting patient's functional outcome.   REHAB POTENTIAL:  Fair due to progressive nature of disease  CLINICAL DECISION MAKING: Evolving/moderate complexity  EVALUATION COMPLEXITY: Moderate  PLAN:  PT FREQUENCY: 2x/week  PT DURATION: 4 weeks + eval  PLANNED INTERVENTIONS: 97110-Therapeutic exercises, 97530- Therapeutic activity, [id number]- Neuromuscular re-education, [phone]- Self Care, [phone]- Gait training, and [phone]- Aquatic Therapy  PLAN FOR NEXT SESSION: Land - balance HEP                     Aquatic - balance and gait training   Kary Kos, PT 08/14/2023, 5:04 PM

## 2023-08-18 ENCOUNTER — Ambulatory Visit: Admitting: Physical Therapy

## 2023-08-18 ENCOUNTER — Encounter: Payer: Self-pay | Admitting: Physical Therapy

## 2023-08-18 DIAGNOSIS — R278 Other lack of coordination: Secondary | ICD-10-CM

## 2023-08-18 DIAGNOSIS — R2689 Other abnormalities of gait and mobility: Secondary | ICD-10-CM

## 2023-08-18 DIAGNOSIS — R2681 Unsteadiness on feet: Secondary | ICD-10-CM

## 2023-08-18 NOTE — Therapy (Signed)
 OUTPATIENT PHYSICAL THERAPY NEURO TREATMENT NOTE   Patient Name: Derrick White MRN: [id number] DOB:17-Oct-1940, 83 y.o., male 89 Date: 08/18/2023   PCP: Gaspar Garbe., MD REFERRING PROVIDER: Asa Lente, MD  END OF SESSION:  PT End of Session - 08/18/23 1923     Visit Number 3    Number of Visits 9    Date for PT Re-Evaluation 09/12/23    Authorization Type [employer]    Authorization Time Period 08-11-23 - 10-11-23    PT Start Time 1055    PT Stop Time 1145    PT Time Calculation (min) 50 min    Activity Tolerance Patient tolerated treatment well    Behavior During Therapy [facility] for tasks assessed/performed             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   Memory loss 03/16/2019   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   Pain due to onychomycosis of toenails of both feet 04/28/2019   Pain in left ankle and joints of left foot 08/06/2018   Slurred speech 03/16/2019   Superficial peroneal nerve neuropathy, 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   TIA (transient ischemic attack) 11/05/2019   Worsened handwriting 03/16/2019   Past Surgical History:  Procedure Laterality Date   APPENDECTOMY     1940s   BACK SURGERY     1981, 1998   CARDIOVERSION N/A 09/13/2020   Procedure: CARDIOVERSION;  Surgeon: Thurmon Fair, MD;  Location: [facility] ENDOSCOPY;  Service: Cardiovascular;  Laterality: N/A;   FRACTURE SURGERY     Left ankle, sx x4   INGUINAL HERNIA REPAIR Right 03/05/2019   Procedure: OPEN RIGHT INGUINAL HERNIA REPAIR WITH MESH;  Surgeon: Berna Bue, MD;  Location: [facility] OR;  Service: General;  Laterality: Right;   KNEE SURGERY Left  1985   LEFT ATRIAL APPENDAGE OCCLUSION N/A 07/27/2020   Procedure: LEFT ATRIAL APPENDAGE OCCLUSION;  Surgeon: Tonny Bollman, MD;  Location: [facility] INVASIVE CV LAB;  Service: Cardiovascular;  Laterality: N/A;   SHOULDER SURGERY Left    TEE WITHOUT CARDIOVERSION N/A 07/27/2020   Procedure: TRANSESOPHAGEAL ECHOCARDIOGRAM (TEE);  Surgeon: Tonny Bollman, MD;  Location: [facility] INVASIVE CV LAB;  Service: Cardiovascular;  Laterality: N/A;   TEE WITHOUT CARDIOVERSION N/A 09/13/2020   Procedure: TRANSESOPHAGEAL ECHOCARDIOGRAM (TEE);  Surgeon: Thurmon Fair, MD;  Location: [facility] ENDOSCOPY;  Service: Cardiovascular;  Laterality: N/A;   TONSILLECTOMY     1945   Patient Active Problem List   Diagnosis Date Noted   Pain in right shoulder 07/31/2021   Persistent atrial fibrillation (HCC) 08/28/2020   Secondary hypercoagulable state (HCC) 07/20/2020   Paroxysmal atrial fibrillation (HCC) 06/19/2020   TIA (transient ischemic attack) 11/05/2019   Essential hypertension 11/05/2019   Cerebellar ataxia (HCC) 11/05/2019   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   C6 radiculopathy 05/04/2019   Pain due to onychomycosis of toenails of both feet 04/28/2019   Worsened handwriting 03/16/2019   Gait disturbance 03/16/2019   Slurred speech 03/16/2019   Memory loss 03/16/2019   Pain in left ankle and joints of left foot 08/06/2018   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   Foot pain, left  06/04/2016   Dysesthesia 06/04/2016   Superficial peroneal nerve neuropathy, left 06/04/2016   History of cervical spinal surgery 06/04/2016    ONSET DATE: Referral date 06-26-23  REFERRING DIAG: Diagnosis G11.8 (ICD-10-CM) - Spinocerebellar ataxia (HCC) R26.9 (ICD-10-CM) - Gait disturbance  THERAPY DIAG:  Other abnormalities of gait and mobility  Unsteadiness on feet  Other lack of coordination  Rationale for Evaluation and Treatment: Rehabilitation  SUBJECTIVE:                                                                                                                                                                                              SUBJECTIVE STATEMENT:  Pt reports he felt fine after aquatic PT session last week; would like to do a combination land/aquatic PT appts  Per chart note:  "Update  06/26/2023" from Dr. Epimenio Foot He feels his symptoms are mostly stable.  Balance is poor and he uses a rolling walker outside but walls/furniture or cane indoors.  He has no recent falls but has a lot of stumbles.   He has difficulty with slurred speech and swallowing.  Handwriting is poor abut able to feed himself. His family is still a walk in tub with grab bars.   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 NCV/EMG study did not show any evidence of motor neuron disease. We did the Athena SCA panel (30+ genes). It was borderline. 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 No  PRECAUTIONS: Fall  RED FLAGS: None   WEIGHT BEARING RESTRICTIONS: No  FALLS: Has patient fallen in last 6  months? No  LIVING ENVIRONMENT: Lives with: lives with their spouse Lives in: House/apartment Stairs: Yes: Internal: 12 steps; can reach both and External: 2  steps; can reach both - in back; pt reports he does not access 2nd level of home - has bedroom on lower level Has following equipment at home: Dan Humphreys - 4 wheeled, walking canes  PLOF: Independent with basic ADLs, Independent with household mobility with device, Independent with community mobility with device, and Needs assistance with homemaking  PATIENT GOALS: get around better and not fall - "stay out of a wheelchair"  OBJECTIVE:  Note: Objective measures were completed at Evaluation unless otherwise noted.  DIAGNOSTIC FINDINGS: N/A (none recently)  COGNITION: Overall cognitive status: Within functional limits for tasks assessed   SENSATION: WFL  COORDINATION: Decreased bil. LE's due to ataxia  POSTURE: rounded shoulders and forward head  LOWER EXTREMITY ROM:   WFL's bil. LE's   LOWER EXTREMITY MMT:    MMT Right Eval Left Eval  Hip flexion 5 5  Hip extension    Hip abduction    Hip adduction    Hip internal rotation    Hip external rotation    Knee flexion 5 5  Knee extension 5 5  Ankle dorsiflexion 5 5  Ankle plantarflexion    Ankle inversion    Ankle eversion    (Blank rows = not tested)  BED MOBILITY:  Modified independent  TRANSFERS: Assistive device utilized: Environmental consultant - 4 wheeled  Sit to stand: Modified independence Stand to sit: Modified independence  GAIT: Gait pattern: ataxic Distance walked: 100' Assistive device utilized: Environmental consultant - 4 wheeled Level of assistance: SBA Comments: needs cues for correct hand placement for safety with sit > stand transfers  FUNCTIONAL TESTS:  5 times sit to stand: 13.57 secs without UE support from mat Timed up and go (TUG): 24.12 secs 10 meter walk test: 18.19  secs = 1.80 ft/sec with RW                                                                                                                               TREATMENT DATE: 08-18-23  NeuroRe-ed: Pt performed balance exercises as issued for HEP - at counter with UE support for safety Access Code: [email or web address] URL: [email or web address] Date: 08/18/2023 Prepared by: Maebelle Munroe  Exercises - Sit to Stand Without Arm Support  - 1 x daily - 7 x weekly - 1 sets - 10 reps - Standing Hip Flexion with Counter Support  - 1 x daily - 7 x weekly - 1 sets - 10 reps - Standing Hip Extension with Counter Support  - 1 x daily - 7 x weekly - 1 sets - 10 reps - Standing Hip Abduction with Counter Support  - 1 x daily - 7 x weekly - 1 sets - 10 reps - Standing March with Counter Support  - 1 x daily - 7 x weekly - 1 sets - 10 reps - Seated Marching with Opposite Shoulder Flexion  - 1 x daily - 7 x weekly - 1 sets - 10 reps - Single Leg Stance  with Support  - 1 x daily - 7 x weekly - 1 sets - 2 reps - 10 sec hold - Step Sideways  - 1 x daily - 7 x weekly - 1 sets - 10 reps  Balance exercises performed inside // bars - performed stepping over/back of black balance beam 10 reps each leg with UE support Tap ups to 6" step - straight ahead then diagonal taps with each foot 10 reps each with UE support Marching inside // bars with lifting contralateral UE for improved coordination Touching 2 colored discs as targets - straight ahead 5 reps each foot, then diagonal touches 5 reps each foot with UE support  Gait: Pt gait trained inside // bars forward 10' x 4 reps with use of mirror for visual feedback Backwards 10' x 2 reps without UE support on // bars - with CGA for safety  TherEx; Heel raises 10 reps at counter - bil. LE's -with UE support  SciFit level 4.0 x 5" with bil. Ue's and LE's for reciprocal movement/improved coordination UE's and LE's     PATIENT EDUCATION: Education details:  Balance HEP - [employer] Person educated: Patient Education method: Explanation and Handouts Education  comprehension: verbalized understanding  HOME EXERCISE PROGRAM: To be established  GOALS: Goals reviewed with patient? Yes  SHORT TERM GOALS: SAME AS LTG'S   LONG TERM GOALS: Target date: 09-12-23  Improve 5x sit to stand score to </= 11 secs without UE support from mat table. Baseline: 13.57 secs without UE support Goal status: INITIAL  2.  Improve Timed Up and Go score to </= 20 secs with use of RW to demo improved mobility and reduced fall risk. Baseline:  24.12 secs with RW - incorrect hand placement Goal status: INITIAL  3.  Improve gait velocity to >/= 2.0 ft/sec with RW for increased gait efficiency. Baseline:  18.19 secs = 1.80 ft/sec Goal status: INITIAL  4.  Pt will participate in aquatic therapy session and determine if this would be a preferred ongoing exercise program upon D/C.  Baseline:  Goal status: INITIAL  5.  Independent in HEP for land based balance exercises (and for aquatic exercises if pt requests to continue aquatic exercise upon D/C). Baseline:  Goal status: INITIAL  ASSESSMENT:  CLINICAL IMPRESSION: PT session focused on standing balance exercises to improve SLS on each leg, coordination exercises for each LE, and strengthening exercises for bil. LE's.  HEP for balance exercises was established.  Pt continues to have significant decreased SLS on each leg with ataxic gait pattern.  UE support is needed for safety with standing balance exercises.  Cont with POC.   OBJECTIVE IMPAIRMENTS: Abnormal gait, decreased balance, decreased coordination, and decreased safety awareness.   ACTIVITY LIMITATIONS: carrying, bending, squatting, and locomotion level  PARTICIPATION LIMITATIONS: shopping, community activity, and yard work  PERSONAL FACTORS: Time since onset of injury/illness/exacerbation and 1 comorbidity: cerebellar ataxia  are also affecting patient's functional outcome.   REHAB POTENTIAL: Fair due to progressive nature of disease  CLINICAL  DECISION MAKING: Evolving/moderate complexity  EVALUATION COMPLEXITY: Moderate  PLAN:  PT FREQUENCY: 2x/week  PT DURATION: 4 weeks + eval  PLANNED INTERVENTIONS: 97110-Therapeutic exercises, 97530- Therapeutic activity, [id number]- Neuromuscular re-education, [phone]- Self Care, [id number]- Gait training, and [phone]- Aquatic Therapy  PLAN FOR NEXT SESSION: Land - balance HEP                     Aquatic - balance and gait training   Ilissa Rosner, Bonita Quin  Rosalita Chessman, PT 08/18/2023, 7:28 PM

## 2023-08-19 DIAGNOSIS — I4891 Unspecified atrial fibrillation: Secondary | ICD-10-CM | POA: Diagnosis not present

## 2023-08-19 DIAGNOSIS — E663 Overweight: Secondary | ICD-10-CM | POA: Diagnosis not present

## 2023-08-19 DIAGNOSIS — H259 Unspecified age-related cataract: Secondary | ICD-10-CM | POA: Diagnosis not present

## 2023-08-19 DIAGNOSIS — I679 Cerebrovascular disease, unspecified: Secondary | ICD-10-CM | POA: Diagnosis not present

## 2023-08-19 DIAGNOSIS — M1990 Unspecified osteoarthritis, unspecified site: Secondary | ICD-10-CM | POA: Diagnosis not present

## 2023-08-19 DIAGNOSIS — G620 Drug-induced polyneuropathy: Secondary | ICD-10-CM | POA: Diagnosis not present

## 2023-08-19 DIAGNOSIS — E785 Hyperlipidemia, unspecified: Secondary | ICD-10-CM | POA: Diagnosis not present

## 2023-08-19 DIAGNOSIS — R260 Ataxic gait: Secondary | ICD-10-CM | POA: Diagnosis not present

## 2023-08-19 DIAGNOSIS — K219 Gastro-esophageal reflux disease without esophagitis: Secondary | ICD-10-CM | POA: Diagnosis not present

## 2023-08-19 DIAGNOSIS — D6869 Other thrombophilia: Secondary | ICD-10-CM | POA: Diagnosis not present

## 2023-08-19 DIAGNOSIS — I10 Essential (primary) hypertension: Secondary | ICD-10-CM | POA: Diagnosis not present

## 2023-08-19 DIAGNOSIS — G4700 Insomnia, unspecified: Secondary | ICD-10-CM | POA: Diagnosis not present

## 2023-08-20 ENCOUNTER — Ambulatory Visit: Payer: Self-pay | Attending: Neurology | Admitting: Physical Therapy

## 2023-08-20 ENCOUNTER — Encounter: Payer: Self-pay | Admitting: Physical Therapy

## 2023-08-20 DIAGNOSIS — R2681 Unsteadiness on feet: Secondary | ICD-10-CM | POA: Diagnosis not present

## 2023-08-20 DIAGNOSIS — R278 Other lack of coordination: Secondary | ICD-10-CM | POA: Insufficient documentation

## 2023-08-20 DIAGNOSIS — R2689 Other abnormalities of gait and mobility: Secondary | ICD-10-CM | POA: Diagnosis not present

## 2023-08-20 NOTE — Therapy (Signed)
 OUTPATIENT PHYSICAL THERAPY NEURO/AQUATIC THERAPY TREATMENT NOTE   Patient Name: Derrick White MRN: [id number] DOB:[birth year], 83 y.o., male 78 Date: 08/20/2023   PCP: Gaspar Garbe., MD REFERRING PROVIDER: Asa Lente, MD  END OF SESSION:  PT End of Session - 08/20/23 1948     Visit Number 4    Number of Visits 9    Date for PT Re-Evaluation 09/12/23    Authorization Type [employer]    Authorization Time Period 08-11-23 - 10-11-23    PT Start Time 1400    PT Stop Time 1445    PT Time Calculation (min) 45 min    Equipment Utilized During Treatment Other (comment)   barbells, aquatic step   Activity Tolerance Patient tolerated treatment well    Behavior During Therapy [facility] for tasks assessed/performed             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   Memory loss 03/16/2019   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   Pain due to onychomycosis of toenails of both feet 04/28/2019   Pain in left ankle and joints of left foot 08/06/2018   Slurred speech 03/16/2019   Superficial peroneal nerve neuropathy, 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   TIA (transient ischemic attack) 11/05/2019   Worsened handwriting 03/16/2019   Past Surgical History:  Procedure Laterality Date   APPENDECTOMY     1940s   BACK SURGERY     1981, 1998   CARDIOVERSION N/A 09/13/2020   Procedure: CARDIOVERSION;  Surgeon: Thurmon Fair, MD;  Location: [facility] ENDOSCOPY;  Service: Cardiovascular;  Laterality: N/A;   FRACTURE SURGERY     Left ankle, sx x4   INGUINAL HERNIA REPAIR Right 03/05/2019   Procedure: OPEN RIGHT INGUINAL HERNIA REPAIR WITH MESH;  Surgeon:  Berna Bue, MD;  Location: [facility] OR;  Service: General;  Laterality: Right;   KNEE SURGERY Left 1985   LEFT ATRIAL APPENDAGE OCCLUSION N/A 07/27/2020   Procedure: LEFT ATRIAL APPENDAGE OCCLUSION;  Surgeon: Tonny Bollman, MD;  Location: [facility] INVASIVE CV LAB;  Service: Cardiovascular;  Laterality: N/A;   SHOULDER SURGERY Left    TEE WITHOUT CARDIOVERSION N/A 07/27/2020   Procedure: TRANSESOPHAGEAL ECHOCARDIOGRAM (TEE);  Surgeon: Tonny Bollman, MD;  Location: [facility] INVASIVE CV LAB;  Service: Cardiovascular;  Laterality: N/A;   TEE WITHOUT CARDIOVERSION N/A 09/13/2020   Procedure: TRANSESOPHAGEAL ECHOCARDIOGRAM (TEE);  Surgeon: Thurmon Fair, MD;  Location: [facility] ENDOSCOPY;  Service: Cardiovascular;  Laterality: N/A;   TONSILLECTOMY     1945   Patient Active Problem List   Diagnosis Date Noted   Pain in right shoulder 07/31/2021   Persistent atrial fibrillation (HCC) 08/28/2020   Secondary hypercoagulable state (HCC) 07/20/2020   Paroxysmal atrial fibrillation (HCC) 06/19/2020   TIA (transient ischemic attack) 11/05/2019   Essential hypertension 11/05/2019   Cerebellar ataxia (HCC) 11/05/2019   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   C6 radiculopathy 05/04/2019   Pain due to onychomycosis of toenails of both feet 04/28/2019   Worsened handwriting 03/16/2019   Gait disturbance 03/16/2019   Slurred speech 03/16/2019   Memory loss 03/16/2019   Pain in left ankle and joints of  left foot 08/06/2018   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   Foot pain, left 06/04/2016   Dysesthesia 06/04/2016   Superficial peroneal nerve neuropathy, 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   History of cervical spinal surgery 06/04/2016    ONSET DATE: Referral date 06-26-23  REFERRING DIAG: Diagnosis G11.8 (ICD-10-CM) - Spinocerebellar ataxia (HCC) R26.9 (ICD-10-CM) - Gait disturbance  THERAPY DIAG:  Other abnormalities of gait and mobility  Unsteadiness on feet  Other lack of coordination  Rationale for Evaluation and  Treatment: Rehabilitation  SUBJECTIVE:                                                                                                                                                                                             SUBJECTIVE STATEMENT:  Pt reports no changes or problems since previous land PT session on Monday this week  Per chart note:  "Update  06/26/2023" from Dr. Epimenio Foot He feels his symptoms are mostly stable.  Balance is poor and he uses a rolling walker outside but walls/furniture or cane indoors.  He has no recent falls but has a lot of stumbles.   He has difficulty with slurred speech and swallowing.  Handwriting is poor abut able to feed himself. His family is still a walk in tub with grab bars.   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 NCV/EMG study did not show any evidence of motor neuron disease. We did the [employer] SCA panel (30+ genes). It was borderline. 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 No  PRECAUTIONS: Fall  RED  FLAGS: None   WEIGHT BEARING RESTRICTIONS: No  FALLS: Has patient fallen in last 6 months? No  LIVING ENVIRONMENT: Lives with: lives with their spouse Lives in: House/apartment Stairs: Yes:  Internal: 12 steps; can reach both and External: 2 steps; can reach both - in back; pt reports he does not access 2nd level of home - has bedroom on lower level Has following equipment at home: Dan Humphreys - 4 wheeled, walking canes  PLOF: Independent with basic ADLs, Independent with household mobility with device, Independent with community mobility with device, and Needs assistance with homemaking  PATIENT GOALS: get around better and not fall - "stay out of a wheelchair"  OBJECTIVE:  Note: Objective measures were completed at Evaluation unless otherwise noted.  DIAGNOSTIC FINDINGS: N/A (none recently)  COGNITION: Overall cognitive status: Within functional limits for tasks assessed   SENSATION: WFL  COORDINATION: Decreased bil. LE's due to ataxia  POSTURE: rounded shoulders and forward head  LOWER EXTREMITY ROM:   WFL's bil. LE's   LOWER EXTREMITY MMT:    MMT Right Eval Left Eval  Hip flexion 5 5  Hip extension    Hip abduction    Hip adduction    Hip internal rotation    Hip external rotation    Knee flexion 5 5  Knee extension 5 5  Ankle dorsiflexion 5 5  Ankle plantarflexion    Ankle inversion    Ankle eversion    (Blank rows = not tested)  BED MOBILITY:  Modified independent  TRANSFERS: Assistive device utilized: Environmental consultant - 4 wheeled  Sit to stand: Modified independence Stand to sit: Modified independence  GAIT: Gait pattern: ataxic Distance walked: 100' Assistive device utilized: Environmental consultant - 4 wheeled Level of assistance: SBA Comments: needs cues for correct hand placement for safety with sit > stand transfers  FUNCTIONAL TESTS:  5 times sit to stand: 13.57 secs without UE support from mat Timed up and go (TUG): 24.12 secs 10 meter walk test: 18.19  secs =  1.80 ft/sec with RW                                                                                                                              TREATMENT DATE: 08-20-23    Aquatic PT at [facility] - pool temp 91 degrees  Patient seen for aquatic therapy today.  Treatment took place in water 3.6 - 4.5 feet deep depending upon activity.  Pt entered the pool via step negotiation with use of hand rails with CGA to min assist for stability with descending steps to prevent slipping; SBA with use of hand rails with ascending steps for exiting pool - step by step sequence used with ascension and descension.   Pt performed water walking for warm up - forwards, backwards and sideways 18' x 4 reps each direction across width of pool - holding onto large barbell for UE support  Marching in place 10 reps each leg with UE support on pool edge for assist with balance; marching forwards across pool 18' x 2 reps with UE support on barbell with SBA  Core stabilization exercises; pt stood statically with feet shoulder width  apart - moved UE's forward/back 10 reps; shoulder horizontal abduction/adduction 10 reps   -pt performed stepping forward back pushing barbells forward/back 10 reps - RLE and LLE       Performed stepping laterally (out/in) 10 reps each leg/moving barbells out to side in horizontal abdct/adduction 10 reps each leg       Performed stepping back/forward 10 reps each leg - little UE movement with barbells  Balance exercises - pt performed alternating forward, side and backward kicks 10 reps each leg   Pt stood with feet together - cues to tighten core - holding small barbells  Pt performed step ups onto aquatic step in 4.5' water depth - 10 reps each leg; stepped up/stepped down/ turned 180 degrees around to repeat sequence - 3 reps Pt performed tap ups alternating feet to aquatic step 10 reps with UE support on pool edge prn to assist with balance  Ai Chi posture "Balancing" at end of session  - pt performed each side (RUE/RLE) separately with tactile cues for correct sequence initially and performed without cueing but with UE support on pool edge for assist with balance - performed 10 reps each side  Pt requires buoyancy of water for support for reduced fall risk with balance exercises and activities as pt able to safely attempt and perform exercises in water compared to fall risk factor with performing same exercises on land.  Viscosity of water is needed for resistance for strengthening and current of water provides perturbations for challenge for balance training.  Pt is able to safely perform ambulation without device in water which is unable to be attempted safely on land.     PATIENT EDUCATION: Education details: Eval results with recommended POC: discussed aquatic therapy and provided information/handout as pt is interested in this service Person educated: Patient Education method: Explanation and Handouts Education comprehension: verbalized understanding  HOME EXERCISE PROGRAM: To be established  GOALS: Goals reviewed with patient? Yes  SHORT TERM GOALS: SAME AS LTG'S   LONG TERM GOALS: Target date: 09-12-23  Improve 5x sit to stand score to </= 11 secs without UE support from mat table. Baseline: 13.57 secs without UE support Goal status: INITIAL  2.  Improve Timed Up and Go score to </= 20 secs with use of RW to demo improved mobility and reduced fall risk. Baseline:  24.12 secs with RW - incorrect hand placement Goal status: INITIAL  3.  Improve gait velocity to >/= 2.0 ft/sec with RW for increased gait efficiency. Baseline:  18.19 secs = 1.80 ft/sec Goal status: INITIAL  4.  Pt will participate in aquatic therapy session and determine if this would be a preferred ongoing exercise program upon D/C.  Baseline:  Goal status: INITIAL  5.  Independent in HEP for land based balance exercises (and for aquatic exercises if pt requests to continue aquatic exercise  upon D/C). Baseline:  Goal status: INITIAL  ASSESSMENT:  CLINICAL IMPRESSION: Aquatic PT session focused on water walking, core stabilization and balance/coordination exercises with UE movement combined with LE movement for improved coordination.  Pt demonstrated improved standing balance in today's session compared to performance in previous aquatic session with less reliance on UE support on floatation device or side of pool for assist with balance recovery.  Pt tolerated exercises well with no rest breaks needed during session.  Cont with POC.   OBJECTIVE IMPAIRMENTS: Abnormal gait, decreased balance, decreased coordination, and decreased safety awareness.   ACTIVITY LIMITATIONS: carrying, bending, squatting, and locomotion level  PARTICIPATION LIMITATIONS: shopping, community activity, and yard work  PERSONAL FACTORS: Time since onset of injury/illness/exacerbation and 1 comorbidity: cerebellar ataxia  are also affecting patient's functional outcome.   REHAB POTENTIAL: Fair due to progressive nature of disease  CLINICAL DECISION MAKING: Evolving/moderate complexity  EVALUATION COMPLEXITY: Moderate  PLAN:  PT FREQUENCY: 2x/week  PT DURATION: 4 weeks + eval  PLANNED INTERVENTIONS: 97110-Therapeutic exercises, 97530- Therapeutic activity, [id number]- Neuromuscular re-education, [phone]- Self Care, [id number]- Gait training, and [phone]- Aquatic Therapy  PLAN FOR NEXT SESSION: Land - check balance HEP - continue balance/coordination exercises                    Aquatic - balance and gait training   Mahkayla Preece, Donavan Burnet, PT 08/20/2023, 7:52 PM

## 2023-08-25 ENCOUNTER — Ambulatory Visit: Admitting: Physical Therapy

## 2023-08-25 ENCOUNTER — Other Ambulatory Visit: Payer: Self-pay | Admitting: Cardiology

## 2023-08-25 DIAGNOSIS — R2689 Other abnormalities of gait and mobility: Secondary | ICD-10-CM | POA: Diagnosis not present

## 2023-08-25 DIAGNOSIS — R278 Other lack of coordination: Secondary | ICD-10-CM

## 2023-08-25 DIAGNOSIS — R2681 Unsteadiness on feet: Secondary | ICD-10-CM

## 2023-08-25 NOTE — Therapy (Unsigned)
 OUTPATIENT PHYSICAL THERAPY NEURO TREATMENT NOTE   Patient Name: Derrick White MRN: [id number] DOB:[birth year], 83 y.o., male 3 Date: 08/26/2023   PCP: Gaspar Garbe., MD REFERRING PROVIDER: Asa Lente, MD  END OF SESSION:  PT End of Session - 08/26/23 1045     Visit Number 5    Number of Visits 9    Date for PT Re-Evaluation 09/12/23    Authorization Type [employer]    Authorization Time Period 08-11-23 - 10-11-23    PT Start Time 1101    PT Stop Time 1145    PT Time Calculation (min) 44 min    Equipment Utilized During Treatment Gait belt   barbells, aquatic step   Activity Tolerance Patient tolerated treatment well    Behavior During Therapy WFL for tasks assessed/performed              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   Memory loss 03/16/2019   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   Pain due to onychomycosis of toenails of both feet 04/28/2019   Pain in left ankle and joints of left foot 08/06/2018   Slurred speech 03/16/2019   Superficial peroneal nerve neuropathy, 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   TIA (transient ischemic attack) 11/05/2019   Worsened handwriting 03/16/2019   Past Surgical History:  Procedure Laterality Date   APPENDECTOMY     1940s   BACK SURGERY     1981, 1998   CARDIOVERSION N/A 09/13/2020   Procedure: CARDIOVERSION;  Surgeon: Thurmon Fair, MD;  Location: [facility] ENDOSCOPY;  Service: Cardiovascular;  Laterality: N/A;   FRACTURE SURGERY     Left ankle, sx x4   INGUINAL HERNIA REPAIR Right 03/05/2019   Procedure: OPEN RIGHT INGUINAL HERNIA REPAIR WITH MESH;  Surgeon: Berna Bue, MD;   Location: [facility] OR;  Service: General;  Laterality: Right;   KNEE SURGERY Left 1985   LEFT ATRIAL APPENDAGE OCCLUSION N/A 07/27/2020   Procedure: LEFT ATRIAL APPENDAGE OCCLUSION;  Surgeon: Tonny Bollman, MD;  Location: [facility] INVASIVE CV LAB;  Service: Cardiovascular;  Laterality: N/A;   SHOULDER SURGERY Left    TEE WITHOUT CARDIOVERSION N/A 07/27/2020   Procedure: TRANSESOPHAGEAL ECHOCARDIOGRAM (TEE);  Surgeon: Tonny Bollman, MD;  Location: [facility] INVASIVE CV LAB;  Service: Cardiovascular;  Laterality: N/A;   TEE WITHOUT CARDIOVERSION N/A 09/13/2020   Procedure: TRANSESOPHAGEAL ECHOCARDIOGRAM (TEE);  Surgeon: Thurmon Fair, MD;  Location: [facility] ENDOSCOPY;  Service: Cardiovascular;  Laterality: N/A;   TONSILLECTOMY     1945   Patient Active Problem List   Diagnosis Date Noted   Pain in right shoulder 07/31/2021   Persistent atrial fibrillation (HCC) 08/28/2020   Secondary hypercoagulable state (HCC) 07/20/2020   Paroxysmal atrial fibrillation (HCC) 06/19/2020   TIA (transient ischemic attack) 11/05/2019   Essential hypertension 11/05/2019   Cerebellar ataxia (HCC) 11/05/2019   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   C6 radiculopathy 05/04/2019   Pain due to onychomycosis of toenails of both feet 04/28/2019   Worsened handwriting 03/16/2019   Gait disturbance 03/16/2019   Slurred speech 03/16/2019   Memory loss 03/16/2019   Pain in left ankle and joints of  left foot 08/06/2018   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   Foot pain, left 06/04/2016   Dysesthesia 06/04/2016   Superficial peroneal nerve neuropathy, 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   History of cervical spinal surgery 06/04/2016    ONSET DATE: Referral date 06-26-23  REFERRING DIAG: Diagnosis G11.8 (ICD-10-CM) - Spinocerebellar ataxia (HCC) R26.9 (ICD-10-CM) - Gait disturbance  THERAPY DIAG:  Other abnormalities of gait and mobility  Unsteadiness on feet  Other lack of coordination  Rationale for Evaluation and Treatment:  Rehabilitation  SUBJECTIVE:                                                                                                                                                                                             SUBJECTIVE STATEMENT:  Pt reports he is doing fine - no problems or changes - today is his birthday:  pt reports he has done the balance exercises some at home  Per chart note:  "Update  06/26/2023" from Dr. Epimenio Foot He feels his symptoms are mostly stable.  Balance is poor and he uses a rolling walker outside but walls/furniture or cane indoors.  He has no recent falls but has a lot of stumbles.   He has difficulty with slurred speech and swallowing.  Handwriting is poor abut able to feed himself. His family is still a walk in tub with grab bars.   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 NCV/EMG study did not show any evidence of motor neuron disease. We did the [employer] SCA panel (30+ genes). It was borderline. 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  No  PRECAUTIONS: Fall  RED FLAGS: None   WEIGHT BEARING RESTRICTIONS: No  FALLS: Has patient fallen in last 6 months? No  LIVING ENVIRONMENT: Lives with: lives with their spouse Lives in: House/apartment Stairs: Yes: Internal: 12 steps; can reach both and External: 2 steps; can reach both - in back; pt reports he does not access 2nd level of home - has bedroom on lower level Has following equipment at home: Dan Humphreys - 4 wheeled, walking canes  PLOF: Independent with basic ADLs, Independent with household mobility with device, Independent with community mobility with device, and Needs assistance with homemaking  PATIENT GOALS: get around better and not fall - "stay out of a wheelchair"  OBJECTIVE:  Note: Objective measures were completed at Evaluation unless otherwise noted.  DIAGNOSTIC FINDINGS: N/A (none recently)  COGNITION: Overall cognitive status: Within functional limits for tasks assessed   SENSATION: WFL  COORDINATION: Decreased bil. LE's due to ataxia  POSTURE: rounded shoulders and forward head  LOWER EXTREMITY ROM:   WFL's bil. LE's   LOWER EXTREMITY MMT:    MMT Right Eval Left Eval  Hip flexion 5 5  Hip extension    Hip abduction    Hip adduction    Hip internal rotation    Hip external rotation    Knee flexion 5 5  Knee extension 5 5  Ankle dorsiflexion 5 5  Ankle plantarflexion    Ankle inversion    Ankle eversion    (Blank rows = not tested)  BED MOBILITY:  Modified independent  TRANSFERS: Assistive device utilized: Environmental consultant - 4 wheeled  Sit to stand: Modified independence Stand to sit: Modified independence  GAIT: Gait pattern: ataxic Distance walked: 100' Assistive device utilized: Environmental consultant - 4 wheeled Level of assistance: SBA Comments: needs cues for correct hand placement for safety with sit > stand transfers  FUNCTIONAL TESTS:  5 times sit to stand: 13.57 secs without UE support from mat Timed up and go (TUG): 24.12 secs 10  meter walk test: 18.19  secs = 1.80 ft/sec with RW                                                                                                                              TREATMENT DATE: 08-25-23  TherAct:  Sit to stand from mat without UE support - 5 reps - with SBA Balance exercises performed inside // bars - performed stepping over/back of black balance beam 10 reps each leg with UE support Tap ups to 6" step - straight ahead then diagonal taps with each foot 10 reps each with UE support; tap ups to 2nd step 5 reps each leg; then performed tap ups to 1st step/2nd step/1st step to floor for improved coordination of each leg  Marching inside // bars 10 reps each leg Touching 3 colored discs as targets - pt touched discs in order of called colors for improved coordination  Gait: Pt gait trained inside // bars forward 10' x 2 reps with use of mirror for visual feedback Backwards 10' x 2 reps without UE support on // bars - with  CGA for safety Clinic distances with use of rollator with SBA - pt was instructed to keep feet separated to approx. 4" apart for better balance during gait  NeuroRe-ed:  Pt performed stepping strategy for improved balance and coordination - used bar at mirror for UE support for assist with balance prn- stepped forward/back, stepped laterally/in; performed stepping backward/forward inside // bars with minimal bil. UE support 5 reps each leg  Seated core stabilization exercise - pt sat on SitFit on mat - performed seated marching 10 reps each leg; performed contralateral knee extension with UE flexion 3 reps each side with 5 sec hold - no UE support used for balance - CGA for safety   Pt performed balance exercises as issued for HEP - at counter with UE support for safety Access Code: [email or web address] URL: [email or web address] Date: 08/18/2023 Prepared by: Maebelle Munroe  Exercises - Sit to Stand Without Arm Support  - 1 x daily - 7 x weekly - 1 sets - 10  reps - Standing Hip Flexion with Counter Support  - 1 x daily - 7 x weekly - 1 sets - 10 reps - Standing Hip Extension with Counter Support  - 1 x daily - 7 x weekly - 1 sets - 10 reps - Standing Hip Abduction with Counter Support  - 1 x daily - 7 x weekly - 1 sets - 10 reps - Standing March with Counter Support  - 1 x daily - 7 x weekly - 1 sets - 10 reps - Seated Marching with Opposite Shoulder Flexion  - 1 x daily - 7 x weekly - 1 sets - 10 reps - Single Leg Stance with Support  - 1 x daily - 7 x weekly - 1 sets - 2 reps - 10 sec hold - Step Sideways  - 1 x daily - 7 x weekly - 1 sets - 10 reps  Balance exercises performed inside // bars - performed stepping over/back of black balance beam 10 reps each leg with UE support Tap ups to 6" step - straight ahead then diagonal taps with each foot 10 reps each with UE support Marching inside // bars with lifting contralateral UE for improved coordination Touching 2 colored discs as targets - straight ahead 5 reps each foot, then diagonal touches 5 reps each foot with UE support  Gait: Pt gait trained inside // bars forward 10' x 4 reps with use of mirror for visual feedback Backwards 10' x 2 reps without UE support on // bars - with CGA for safety  TherEx; Heel raises 10 reps at counter - bil. LE's -with UE support  SciFit level 4.0 x 5" with bil. Ue's and LE's for reciprocal movement/improved coordination UE's and LE's     PATIENT EDUCATION: Education details:  Balance HEP - Med[employer] Person educated: Patient Education method: Explanation and Handouts Education comprehension: verbalized understanding  HOME EXERCISE PROGRAM: To be established  GOALS: Goals reviewed with patient? Yes  SHORT TERM GOALS: SAME AS LTG'S   LONG TERM GOALS: Target date: 09-12-23  Improve 5x sit to stand score to </= 11 secs without UE support from mat table. Baseline: 13.57 secs without UE support Goal status: INITIAL  2.  Improve Timed Up and Go  score to </= 20 secs with use of RW to demo improved mobility and reduced fall risk. Baseline:  24.12 secs with RW - incorrect hand placement Goal status: INITIAL  3.  Improve gait velocity to >/= 2.0 ft/sec with RW for  increased gait efficiency. Baseline:  18.19 secs = 1.80 ft/sec Goal status: INITIAL  4.  Pt will participate in aquatic therapy session and determine if this would be a preferred ongoing exercise program upon D/C.  Baseline:  Goal status: INITIAL  5.  Independent in HEP for land based balance exercises (and for aquatic exercises if pt requests to continue aquatic exercise upon D/C). Baseline:  Goal status: INITIAL  ASSESSMENT:  CLINICAL IMPRESSION: PT session focused on coordination exercises for each leg and balance training to improve SLS.  Pt continues to need UE support for safety with standing balance exercises due to SLS deficits on each leg.  Pt's gait pattern and balance improved with verbal cues to keep feet separated as pt has tendency to ambulate with narrow BOS.  Pt did well maintaining sitting balance with core stabilization on SitFit.   Cont with POC.   OBJECTIVE IMPAIRMENTS: Abnormal gait, decreased balance, decreased coordination, and decreased safety awareness.   ACTIVITY LIMITATIONS: carrying, bending, squatting, and locomotion level  PARTICIPATION LIMITATIONS: shopping, community activity, and yard work  PERSONAL FACTORS: Time since onset of injury/illness/exacerbation and 1 comorbidity: cerebellar ataxia  are also affecting patient's functional outcome.   REHAB POTENTIAL: Fair due to progressive nature of disease  CLINICAL DECISION MAKING: Evolving/moderate complexity  EVALUATION COMPLEXITY: Moderate  PLAN:  PT FREQUENCY: 2x/week  PT DURATION: 4 weeks + eval  PLANNED INTERVENTIONS: 97110-Therapeutic exercises, 97530- Therapeutic activity, [id number]- Neuromuscular re-education, [phone]- Self Care, [id number]- Gait training, and [phone]- Aquatic  Therapy  PLAN FOR NEXT SESSION: Land - balance HEP                     Aquatic - balance and gait training   Kary Kos, PT 08/26/2023, 10:47 AM

## 2023-08-26 ENCOUNTER — Encounter: Payer: Self-pay | Admitting: Physical Therapy

## 2023-08-27 ENCOUNTER — Ambulatory Visit: Payer: Self-pay | Admitting: Physical Therapy

## 2023-08-27 ENCOUNTER — Ambulatory Visit: Admitting: Physical Therapy

## 2023-08-27 DIAGNOSIS — R2681 Unsteadiness on feet: Secondary | ICD-10-CM

## 2023-08-27 DIAGNOSIS — H04123 Dry eye syndrome of bilateral lacrimal glands: Secondary | ICD-10-CM | POA: Diagnosis not present

## 2023-08-27 DIAGNOSIS — R278 Other lack of coordination: Secondary | ICD-10-CM

## 2023-08-27 DIAGNOSIS — H25013 Cortical age-related cataract, bilateral: Secondary | ICD-10-CM | POA: Diagnosis not present

## 2023-08-27 DIAGNOSIS — H43813 Vitreous degeneration, bilateral: Secondary | ICD-10-CM | POA: Diagnosis not present

## 2023-08-27 DIAGNOSIS — R2689 Other abnormalities of gait and mobility: Secondary | ICD-10-CM

## 2023-08-27 DIAGNOSIS — H2513 Age-related nuclear cataract, bilateral: Secondary | ICD-10-CM | POA: Diagnosis not present

## 2023-08-27 DIAGNOSIS — H524 Presbyopia: Secondary | ICD-10-CM | POA: Diagnosis not present

## 2023-08-27 DIAGNOSIS — H52203 Unspecified astigmatism, bilateral: Secondary | ICD-10-CM | POA: Diagnosis not present

## 2023-08-28 ENCOUNTER — Encounter: Payer: Self-pay | Admitting: Physical Therapy

## 2023-08-28 NOTE — Therapy (Signed)
 OUTPATIENT PHYSICAL THERAPY NEURO/AQUATIC THERAPY TREATMENT NOTE   Patient Name: Derrick White MRN: [id number] DOB:10/16/1940, 83 y.o., male 72 Date: 08/28/2023   PCP: Gaspar Garbe., MD REFERRING PROVIDER: Asa Lente, MD  END OF SESSION:  PT End of Session - 08/28/23 2030     Visit Number 6    Number of Visits 9    Date for PT Re-Evaluation 09/12/23    Authorization Type [employer]    Authorization Time Period 08-11-23 - 10-11-23    PT Start Time 1405    PT Stop Time 1450    PT Time Calculation (min) 45 min    Equipment Utilized During Treatment Other (comment)   barbells   Activity Tolerance Patient tolerated treatment well    Behavior During Therapy [facility] for tasks assessed/performed             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   Memory loss 03/16/2019   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   Pain due to onychomycosis of toenails of both feet 04/28/2019   Pain in left ankle and joints of left foot 08/06/2018   Slurred speech 03/16/2019   Superficial peroneal nerve neuropathy, 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   TIA (transient ischemic attack) 11/05/2019   Worsened handwriting 03/16/2019   Past Surgical History:  Procedure Laterality Date   APPENDECTOMY     1940s   BACK SURGERY     1981, 1998   CARDIOVERSION N/A 09/13/2020   Procedure: CARDIOVERSION;  Surgeon: Thurmon Fair, MD;  Location: [facility] ENDOSCOPY;  Service: Cardiovascular;  Laterality: N/A;   FRACTURE SURGERY     Left ankle, sx x4   INGUINAL HERNIA REPAIR Right 03/05/2019   Procedure: OPEN RIGHT INGUINAL HERNIA REPAIR WITH MESH;  Surgeon: Berna Bue,  MD;  Location: [facility] OR;  Service: General;  Laterality: Right;   KNEE SURGERY Left 1985   LEFT ATRIAL APPENDAGE OCCLUSION N/A 07/27/2020   Procedure: LEFT ATRIAL APPENDAGE OCCLUSION;  Surgeon: Tonny Bollman, MD;  Location: [facility] INVASIVE CV LAB;  Service: Cardiovascular;  Laterality: N/A;   SHOULDER SURGERY Left    TEE WITHOUT CARDIOVERSION N/A 07/27/2020   Procedure: TRANSESOPHAGEAL ECHOCARDIOGRAM (TEE);  Surgeon: Tonny Bollman, MD;  Location: [facility] INVASIVE CV LAB;  Service: Cardiovascular;  Laterality: N/A;   TEE WITHOUT CARDIOVERSION N/A 09/13/2020   Procedure: TRANSESOPHAGEAL ECHOCARDIOGRAM (TEE);  Surgeon: Thurmon Fair, MD;  Location: [facility] ENDOSCOPY;  Service: Cardiovascular;  Laterality: N/A;   TONSILLECTOMY     1945   Patient Active Problem List   Diagnosis Date Noted   Pain in right shoulder 07/31/2021   Persistent atrial fibrillation (HCC) 08/28/2020   Secondary hypercoagulable state (HCC) 07/20/2020   Paroxysmal atrial fibrillation (HCC) 06/19/2020   TIA (transient ischemic attack) 11/05/2019   Essential hypertension 11/05/2019   Cerebellar ataxia (HCC) 11/05/2019   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   C6 radiculopathy 05/04/2019   Pain due to onychomycosis of toenails of both feet 04/28/2019   Worsened handwriting 03/16/2019   Gait disturbance 03/16/2019   Slurred speech 03/16/2019   Memory loss 03/16/2019   Pain in left ankle and joints of left foot  08/06/2018   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   Foot pain, left 06/04/2016   Dysesthesia 06/04/2016   Superficial peroneal nerve neuropathy, 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   History of cervical spinal surgery 06/04/2016    ONSET DATE: Referral date 06-26-23  REFERRING DIAG: Diagnosis G11.8 (ICD-10-CM) - Spinocerebellar ataxia (HCC) R26.9 (ICD-10-CM) - Gait disturbance  THERAPY DIAG:  Other abnormalities of gait and mobility  Unsteadiness on feet  Other lack of coordination  Rationale for Evaluation and Treatment:  Rehabilitation  SUBJECTIVE:                                                                                                                                                                                             SUBJECTIVE STATEMENT:  Pt reports no changes or problems since previous land PT session on Monday this week  Per chart note:  "Update  06/26/2023" from Dr. Epimenio Foot He feels his symptoms are mostly stable.  Balance is poor and he uses a rolling walker outside but walls/furniture or cane indoors.  He has no recent falls but has a lot of stumbles.   He has difficulty with slurred speech and swallowing.  Handwriting is poor abut able to feed himself. His family is still a walk in tub with grab bars.   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 NCV/EMG study did not show any evidence of motor neuron disease. We did the [employer] SCA panel (30+ genes). It was borderline. 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 No  PRECAUTIONS: Fall  RED FLAGS: None   WEIGHT  BEARING RESTRICTIONS: No  FALLS: Has patient fallen in last 6 months? No  LIVING ENVIRONMENT: Lives with: lives with their spouse Lives in: House/apartment Stairs: Yes: Internal: 12  steps; can reach both and External: 2 steps; can reach both - in back; pt reports he does not access 2nd level of home - has bedroom on lower level Has following equipment at home: Dan Humphreys - 4 wheeled, walking canes  PLOF: Independent with basic ADLs, Independent with household mobility with device, Independent with community mobility with device, and Needs assistance with homemaking  PATIENT GOALS: get around better and not fall - "stay out of a wheelchair"  OBJECTIVE:  Note: Objective measures were completed at Evaluation unless otherwise noted.  DIAGNOSTIC FINDINGS: N/A (none recently)  COGNITION: Overall cognitive status: Within functional limits for tasks assessed   SENSATION: WFL  COORDINATION: Decreased bil. LE's due to ataxia  POSTURE: rounded shoulders and forward head  LOWER EXTREMITY ROM:   WFL's bil. LE's   LOWER EXTREMITY MMT:    MMT Right Eval Left Eval  Hip flexion 5 5  Hip extension    Hip abduction    Hip adduction    Hip internal rotation    Hip external rotation    Knee flexion 5 5  Knee extension 5 5  Ankle dorsiflexion 5 5  Ankle plantarflexion    Ankle inversion    Ankle eversion    (Blank rows = not tested)  BED MOBILITY:  Modified independent  TRANSFERS: Assistive device utilized: Environmental consultant - 4 wheeled  Sit to stand: Modified independence Stand to sit: Modified independence  GAIT: Gait pattern: ataxic Distance walked: 100' Assistive device utilized: Environmental consultant - 4 wheeled Level of assistance: SBA Comments: needs cues for correct hand placement for safety with sit > stand transfers  FUNCTIONAL TESTS:  5 times sit to stand: 13.57 secs without UE support from mat Timed up and go (TUG): 24.12 secs 10 meter walk test: 18.19  secs = 1.80 ft/sec with RW                                                                                                                               TREATMENT DATE: 08-27-23    Aquatic PT at [facility] - pool temp 91 degrees  Patient seen for aquatic therapy today.  Treatment took place in water 3.6 - 4.5 feet deep depending upon activity.  Pt entered the pool via step negotiation with use of hand rails with CGA to min assist for stability with descending steps to prevent slipping; SBA with use of hand rails with ascending steps for exiting pool - step by step sequence used with ascension and descension.   Pt performed water walking for warm up - forwards, backwards and sideways 18' x 4 reps each direction across width of pool - holding onto large barbell for UE support  Marching in place 10 reps each leg with UE support on pool edge for assist with balance; marching forwards across pool 18' x 2 reps with UE support on barbell with SBA  Core stabilization exercises; pt stood statically with feet shoulder width apart -  moved UE's forward/back 10 reps; shoulder horizontal abduction/adduction 10 reps   -pt performed stepping forward back pushing barbells forward/back 10 reps - RLE and LLE       Performed stepping laterally (out/in) 10 reps each leg/moving barbells out to side in horizontal abdct/adduction 10 reps each leg       Performed stepping back/forward 10 reps each leg - little UE movement with barbells  Balance exercises - pt performed alternating forward, side and backward kicks 10 reps each leg   Pt stood with feet together - cues to tighten core - holding small barbells  Pt performed step ups onto aquatic step in 4.5' water depth - 10 reps each leg; stepped up/stepped down/ turned 180 degrees around to repeat sequence - 3 reps Pt performed tap ups alternating feet to aquatic step 10 reps with UE support on pool edge prn to assist with balance  Ai Chi posture "Balancing" at end of session - pt performed each side  (RUE/RLE) separately with tactile cues for correct sequence initially and performed without cueing but with UE support on pool edge for assist with balance - performed 10 reps each side  Pt requires buoyancy of water for support for reduced fall risk with balance exercises and activities as pt able to safely attempt and perform exercises in water compared to fall risk factor with performing same exercises on land.  Viscosity of water is needed for resistance for strengthening and current of water provides perturbations for challenge for balance training.  Pt is able to safely perform ambulation without device in water which is unable to be attempted safely on land.     PATIENT EDUCATION: Education details: Eval results with recommended POC: discussed aquatic therapy and provided information/handout as pt is interested in this service Person educated: Patient Education method: Explanation and Handouts Education comprehension: verbalized understanding  HOME EXERCISE PROGRAM: To be established  GOALS: Goals reviewed with patient? Yes  SHORT TERM GOALS: SAME AS LTG'S   LONG TERM GOALS: Target date: 09-12-23  Improve 5x sit to stand score to </= 11 secs without UE support from mat table. Baseline: 13.57 secs without UE support Goal status: INITIAL  2.  Improve Timed Up and Go score to </= 20 secs with use of RW to demo improved mobility and reduced fall risk. Baseline:  24.12 secs with RW - incorrect hand placement Goal status: INITIAL  3.  Improve gait velocity to >/= 2.0 ft/sec with RW for increased gait efficiency. Baseline:  18.19 secs = 1.80 ft/sec Goal status: INITIAL  4.  Pt will participate in aquatic therapy session and determine if this would be a preferred ongoing exercise program upon D/C.  Baseline:  Goal status: INITIAL  5.  Independent in HEP for land based balance exercises (and for aquatic exercises if pt requests to continue aquatic exercise upon D/C). Baseline:   Goal status: INITIAL  ASSESSMENT:  CLINICAL IMPRESSION: Aquatic PT session focused on water walking, core stabilization and balance/coordination exercises with UE movement combined with LE movement for improved coordination.  Pt demonstrated improved standing balance in today's session compared to performance in previous aquatic session with less reliance on UE support on floatation device or side of pool for assist with balance recovery.  Pt tolerated exercises well with no rest breaks needed during session.  Cont with POC.   OBJECTIVE IMPAIRMENTS: Abnormal gait, decreased balance, decreased coordination, and decreased safety awareness.   ACTIVITY LIMITATIONS: carrying, bending, squatting, and locomotion level  PARTICIPATION LIMITATIONS:  shopping, community activity, and yard work  PERSONAL FACTORS: Time since onset of injury/illness/exacerbation and 1 comorbidity: cerebellar ataxia  are also affecting patient's functional outcome.   REHAB POTENTIAL: Fair due to progressive nature of disease  CLINICAL DECISION MAKING: Evolving/moderate complexity  EVALUATION COMPLEXITY: Moderate  PLAN:  PT FREQUENCY: 2x/week  PT DURATION: 4 weeks + eval  PLANNED INTERVENTIONS: 97110-Therapeutic exercises, 97530- Therapeutic activity, [id number]- Neuromuscular re-education, [phone]- Self Care, [id number]- Gait training, and [phone]- Aquatic Therapy  PLAN FOR NEXT SESSION: Land - check balance HEP - continue balance/coordination exercises                    Aquatic - balance and gait training   Xaria Judon, Donavan Burnet, PT 08/28/2023, 8:32 PM

## 2023-09-01 ENCOUNTER — Ambulatory Visit: Admitting: Physical Therapy

## 2023-09-01 DIAGNOSIS — R278 Other lack of coordination: Secondary | ICD-10-CM

## 2023-09-01 DIAGNOSIS — R2689 Other abnormalities of gait and mobility: Secondary | ICD-10-CM

## 2023-09-01 DIAGNOSIS — R2681 Unsteadiness on feet: Secondary | ICD-10-CM

## 2023-09-01 NOTE — Therapy (Unsigned)
 OUTPATIENT PHYSICAL THERAPY NEURO TREATMENT NOTE   Patient Name: Derrick White MRN: [id number] DOB:[birth year], 83 y.o., male 57 Date: 09/02/2023   PCP: Suzzanne Estrin., MD REFERRING PROVIDER: Jorie Newness, MD  END OF SESSION:  PT End of Session - 09/02/23 0907     Visit Number 7    Number of Visits 9    Date for PT Re-Evaluation 09/12/23    Authorization Type [employer]    Authorization Time Period 08-11-23 - 10-11-23    PT Start Time 1105    PT Stop Time 1150    PT Time Calculation (min) 45 min    Equipment Utilized During Treatment Gait belt    Activity Tolerance Patient tolerated treatment well    Behavior During Therapy [facility] for tasks assessed/performed               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   Memory loss 03/16/2019   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   Pain due to onychomycosis of toenails of both feet 04/28/2019   Pain in left ankle and joints of left foot 08/06/2018   Slurred speech 03/16/2019   Superficial peroneal nerve neuropathy, 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   TIA (transient ischemic attack) 11/05/2019   Worsened handwriting 03/16/2019   Past Surgical History:  Procedure Laterality Date   APPENDECTOMY     1940s   BACK SURGERY     1981, 1998   CARDIOVERSION N/A 09/13/2020   Procedure: CARDIOVERSION;  Surgeon: Luana Rumple, MD;  Location: [facility] ENDOSCOPY;  Service: Cardiovascular;  Laterality: N/A;   FRACTURE SURGERY     Left ankle, sx x4   INGUINAL HERNIA REPAIR Right 03/05/2019   Procedure: OPEN RIGHT INGUINAL HERNIA REPAIR WITH MESH;  Surgeon: Adalberto Acton, MD;  Location: [facility] OR;   Service: General;  Laterality: Right;   KNEE SURGERY Left 1985   LEFT ATRIAL APPENDAGE OCCLUSION N/A 07/27/2020   Procedure: LEFT ATRIAL APPENDAGE OCCLUSION;  Surgeon: Arnoldo Lapping, MD;  Location: [facility] INVASIVE CV LAB;  Service: Cardiovascular;  Laterality: N/A;   SHOULDER SURGERY Left    TEE WITHOUT CARDIOVERSION N/A 07/27/2020   Procedure: TRANSESOPHAGEAL ECHOCARDIOGRAM (TEE);  Surgeon: Arnoldo Lapping, MD;  Location: [facility] INVASIVE CV LAB;  Service: Cardiovascular;  Laterality: N/A;   TEE WITHOUT CARDIOVERSION N/A 09/13/2020   Procedure: TRANSESOPHAGEAL ECHOCARDIOGRAM (TEE);  Surgeon: Luana Rumple, MD;  Location: [facility] ENDOSCOPY;  Service: Cardiovascular;  Laterality: N/A;   TONSILLECTOMY     1945   Patient Active Problem List   Diagnosis Date Noted   Pain in right shoulder 07/31/2021   Persistent atrial fibrillation (HCC) 08/28/2020   Secondary hypercoagulable state (HCC) 07/20/2020   Paroxysmal atrial fibrillation (HCC) 06/19/2020   TIA (transient ischemic attack) 11/05/2019   Essential hypertension 11/05/2019   Cerebellar ataxia (HCC) 11/05/2019   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   C6 radiculopathy 05/04/2019   Pain due to onychomycosis of toenails of both feet 04/28/2019   Worsened handwriting 03/16/2019   Gait disturbance 03/16/2019   Slurred speech 03/16/2019   Memory loss 03/16/2019   Pain in left ankle and joints of left foot 08/06/2018  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   Foot pain, left 06/04/2016   Dysesthesia 06/04/2016   Superficial peroneal nerve neuropathy, 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   History of cervical spinal surgery 06/04/2016    ONSET DATE: Referral date 06-26-23  REFERRING DIAG: Diagnosis G11.8 (ICD-10-CM) - Spinocerebellar ataxia (HCC) R26.9 (ICD-10-CM) - Gait disturbance  THERAPY DIAG:  Other abnormalities of gait and mobility  Unsteadiness on feet  Other lack of coordination  Rationale for Evaluation and Treatment: Rehabilitation  SUBJECTIVE:                                                                                                                                                                                              SUBJECTIVE STATEMENT:  Pt denies problems or changes - is doing fine.  Continues to do exercises at home  Per chart note:  "Update  06/26/2023" from Dr. Epimenio Foot He feels his symptoms are mostly stable.  Balance is poor and he uses a rolling walker outside but walls/furniture or cane indoors.  He has no recent falls but has a lot of stumbles.   He has difficulty with slurred speech and swallowing.  Handwriting is poor abut able to feed himself. His family is still a walk in tub with grab bars.   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 NCV/EMG study did not show any evidence of motor neuron disease. We did the [employer] SCA panel (30+ genes). It was borderline. 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 Unclear if this is playing a role (SCA type 6 has mutations in this [place] but usually triplet repeat)      PAIN:  Are you having pain? No  PRECAUTIONS: Fall  RED FLAGS: None   WEIGHT BEARING RESTRICTIONS: No  FALLS: Has  patient fallen in last 6 months? No  LIVING ENVIRONMENT: Lives with: lives with their spouse Lives in: House/apartment Stairs: Yes: Internal: 12 steps; can  reach both and External: 2 steps; can reach both - in back; pt reports he does not access 2nd level of home - has bedroom on lower level Has following equipment at home: Otho Blitz - 4 wheeled, walking canes  PLOF: Independent with basic ADLs, Independent with household mobility with device, Independent with community mobility with device, and Needs assistance with homemaking  PATIENT GOALS: get around better and not fall - "stay out of a wheelchair"  OBJECTIVE:  Note: Objective measures were completed at Evaluation unless otherwise noted.  DIAGNOSTIC FINDINGS: N/A (none recently)  COGNITION: Overall cognitive status: Within functional limits for tasks assessed   SENSATION: WFL  COORDINATION: Decreased bil. LE's due to ataxia  POSTURE: rounded shoulders and forward head  LOWER EXTREMITY ROM:   WFL's bil. LE's   LOWER EXTREMITY MMT:    MMT Right Eval Left Eval  Hip flexion 5 5  Hip extension    Hip abduction    Hip adduction    Hip internal rotation    Hip external rotation    Knee flexion 5 5  Knee extension 5 5  Ankle dorsiflexion 5 5  Ankle plantarflexion    Ankle inversion    Ankle eversion    (Blank rows = not tested)  BED MOBILITY:  Modified independent  TRANSFERS: Assistive device utilized: Environmental consultant - 4 wheeled  Sit to stand: Modified independence Stand to sit: Modified independence  GAIT: Gait pattern: ataxic Distance walked: 100' Assistive device utilized: Environmental consultant - 4 wheeled Level of assistance: SBA Comments: needs cues for correct hand placement for safety with sit > stand transfers  FUNCTIONAL TESTS:  5 times sit to stand: 13.57 secs without UE support from mat Timed up and go (TUG): 24.12 secs 10 meter walk test: 18.19  secs = 1.80 ft/sec with RW                                                                                                                               TREATMENT DATE: 09-01-23  TherEx: Sit to stand from mat without UE support with feet on Airex - 5 reps - with CGA to SBA; pt initially had posterior LOB but able to correct after 1st 2 reps SciFit level 3 x 4" with UE and LE's for reciprocal movement/improved coordination  NeuroRe-ed:  Balance exercises performed inside // bars - performed stepping over/back of black balance beam 10 reps each leg with UE support Tap ups to 6" step - straight ahead then diagonal taps with each foot 10 reps each with UE support - used 4" step with the number "4" used as targets to improve coordination  Marching inside // bars 10 reps each leg; added contralateral UE flexion with UE support prn for assist with balance recovery;  tapping contralateral knee with hand for improved coordination Touching 3 colored discs as targets - pt touched discs in half circle pattern  Standing alternating hip flexion with knee extended 10 reps  each LE:  standing alternating hip abduction 10 reps each leg; small range alternating hip extension 10 reps each leg  Gait: Clinic distances with use of rollator with supervision Pt gait trained inside // bars forward 10' x 2 reps with use of mirror for visual feedback; 2# weight placed on each leg for weighting to decrease ataxia in each leg Sidestepping inside // bars 10' x 2 reps - 2# weight used on each leg to decrease ataxia Backwards 10' x 2 reps without UE support on // bars - with CGA for safety - use of mirror for visual feedback      Pt performed balance exercises as issued for HEP - at counter with UE support for safety Access Code: [email or web address] URL: [email or web address] Date: 08/18/2023 Prepared by: Johnnette Nakayama  Exercises - Sit to Stand Without Arm Support  - 1 x daily - 7 x weekly - 1 sets - 10 reps - Standing Hip Flexion with Counter Support  - 1 x daily - 7 x weekly - 1 sets  - 10 reps - Standing Hip Extension with Counter Support  - 1 x daily - 7 x weekly - 1 sets - 10 reps - Standing Hip Abduction with Counter Support  - 1 x daily - 7 x weekly - 1 sets - 10 reps - Standing March with Counter Support  - 1 x daily - 7 x weekly - 1 sets - 10 reps - Seated Marching with Opposite Shoulder Flexion  - 1 x daily - 7 x weekly - 1 sets - 10 reps - Single Leg Stance with Support  - 1 x daily - 7 x weekly - 1 sets - 2 reps - 10 sec hold - Step Sideways  - 1 x daily - 7 x weekly - 1 sets - 10 reps  Balance exercises performed inside // bars - performed stepping over/back of black balance beam 10 reps each leg with UE support Tap ups to 6" step - straight ahead then diagonal taps with each foot 10 reps each with UE support Marching inside // bars with lifting contralateral UE for improved coordination Touching 2 colored discs as targets - straight ahead 5 reps each foot, then diagonal touches 5 reps each foot with UE support  Gait: Pt gait trained inside // bars forward 10' x 4 reps with use of mirror for visual feedback Backwards 10' x 2 reps without UE support on // bars - with CGA for safety  TherEx; Heel raises 10 reps at counter - bil. LE's -with UE support  SciFit level 4.0 x 5" with bil. Ue's and LE's for reciprocal movement/improved coordination UE's and LE's     PATIENT EDUCATION: Education details:  Balance HEP - Medbridge Person educated: Patient Education method: Explanation and Handouts Education comprehension: verbalized understanding  HOME EXERCISE PROGRAM: To be established  GOALS: Goals reviewed with patient? Yes  SHORT TERM GOALS: SAME AS LTG'S   LONG TERM GOALS: Target date: 09-12-23  Improve 5x sit to stand score to </= 11 secs without UE support from mat table. Baseline: 13.57 secs without UE support Goal status: INITIAL  2.  Improve Timed Up and Go score to </= 20 secs with use of RW to demo improved mobility and reduced fall  risk. Baseline:  24.12 secs with RW - incorrect hand placement Goal status: INITIAL  3.  Improve gait velocity to >/= 2.0 ft/sec with RW for increased gait efficiency. Baseline:  18.19 secs = 1.80 ft/sec Goal status:  INITIAL  4.  Pt will participate in aquatic therapy session and determine if this would be a preferred ongoing exercise program upon D/C.  Baseline:  Goal status: INITIAL  5.  Independent in HEP for land based balance exercises (and for aquatic exercises if pt requests to continue aquatic exercise upon D/C). Baseline:  Goal status: INITIAL  ASSESSMENT:  CLINICAL IMPRESSION: PT session focused on gait training inside // bars to have UE support available prn with use of 2# weight to decrease ataxia.  Weight appeared to be beneficial with less ataxia noted.  Pt had LOB initially on foam but was able to correct and maintain balance after 1st 2 reps.  Pt continues to need UE support for assist with SLS balance activities.  Cont with POC.   OBJECTIVE IMPAIRMENTS: Abnormal gait, decreased balance, decreased coordination, and decreased safety awareness.   ACTIVITY LIMITATIONS: carrying, bending, squatting, and locomotion level  PARTICIPATION LIMITATIONS: shopping, community activity, and yard work  PERSONAL FACTORS: Time since onset of injury/illness/exacerbation and 1 comorbidity: cerebellar ataxia  are also affecting patient's functional outcome.   REHAB POTENTIAL: Fair due to progressive nature of disease  CLINICAL DECISION MAKING: Evolving/moderate complexity  EVALUATION COMPLEXITY: Moderate  PLAN:  PT FREQUENCY: 2x/week  PT DURATION: 4 weeks + eval  PLANNED INTERVENTIONS: 97110-Therapeutic exercises, 97530- Therapeutic activity, [id number]- Neuromuscular re-education, [phone]- Self Care, [id number]- Gait training, and [phone]- Aquatic Therapy  PLAN FOR NEXT SESSION: Land - balance HEP                     Aquatic - balance and gait training   Koby Pickup, Celeste Cola,  PT 09/02/2023, 10:47 AM

## 2023-09-02 ENCOUNTER — Encounter: Payer: Self-pay | Admitting: Physical Therapy

## 2023-09-03 ENCOUNTER — Encounter: Payer: Self-pay | Admitting: Physical Therapy

## 2023-09-03 ENCOUNTER — Ambulatory Visit: Payer: Self-pay | Admitting: Physical Therapy

## 2023-09-03 DIAGNOSIS — R278 Other lack of coordination: Secondary | ICD-10-CM

## 2023-09-03 DIAGNOSIS — R2681 Unsteadiness on feet: Secondary | ICD-10-CM

## 2023-09-03 DIAGNOSIS — R2689 Other abnormalities of gait and mobility: Secondary | ICD-10-CM

## 2023-09-03 NOTE — Therapy (Signed)
 OUTPATIENT PHYSICAL THERAPY NEURO/AQUATIC THERAPY TREATMENT NOTE   Patient Name: Derrick White MRN: [id number] DOB:01-27-1941, 83 y.o., male 25 Date: 09/03/2023   PCP: Suzzanne Estrin., MD REFERRING PROVIDER: Jorie Newness, MD  END OF SESSION:  PT End of Session - 09/03/23 1949     Visit Number 8    Number of Visits 9    Date for PT Re-Evaluation 09/12/23    Authorization Type [employer]    Authorization Time Period 08-11-23 - 10-11-23    PT Start Time 1400    PT Stop Time 1445    PT Time Calculation (min) 45 min    Equipment Utilized During Treatment Other (comment)   barbells, noodle   Activity Tolerance Patient tolerated treatment well    Behavior During Therapy [facility] for tasks assessed/performed             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   Memory loss 03/16/2019   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   Pain due to onychomycosis of toenails of both feet 04/28/2019   Pain in left ankle and joints of left foot 08/06/2018   Slurred speech 03/16/2019   Superficial peroneal nerve neuropathy, 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   TIA (transient ischemic attack) 11/05/2019   Worsened handwriting 03/16/2019   Past Surgical History:  Procedure Laterality Date   APPENDECTOMY     1940s   BACK SURGERY     1981, 1998   CARDIOVERSION N/A 09/13/2020   Procedure: CARDIOVERSION;  Surgeon: Luana Rumple, MD;  Location: [facility] ENDOSCOPY;  Service: Cardiovascular;  Laterality: N/A;   FRACTURE SURGERY     Left ankle, sx x4   INGUINAL HERNIA REPAIR Right 03/05/2019   Procedure: OPEN RIGHT INGUINAL HERNIA REPAIR WITH MESH;  Surgeon: Adalberto Acton, MD;  Location: [facility] OR;  Service: General;  Laterality: Right;   KNEE SURGERY Left 1985   LEFT ATRIAL APPENDAGE OCCLUSION N/A 07/27/2020   Procedure: LEFT ATRIAL APPENDAGE OCCLUSION;  Surgeon: Arnoldo Lapping, MD;  Location: [facility] INVASIVE CV LAB;  Service: Cardiovascular;  Laterality: N/A;   SHOULDER SURGERY Left    TEE WITHOUT CARDIOVERSION N/A 07/27/2020   Procedure: TRANSESOPHAGEAL ECHOCARDIOGRAM (TEE);  Surgeon: Arnoldo Lapping, MD;  Location: [facility] INVASIVE CV LAB;  Service: Cardiovascular;  Laterality: N/A;   TEE WITHOUT CARDIOVERSION N/A 09/13/2020   Procedure: TRANSESOPHAGEAL ECHOCARDIOGRAM (TEE);  Surgeon: Luana Rumple, MD;  Location: [facility] ENDOSCOPY;  Service: Cardiovascular;  Laterality: N/A;   TONSILLECTOMY     1945   Patient Active Problem List   Diagnosis Date Noted   Pain in right shoulder 07/31/2021   Persistent atrial fibrillation (HCC) 08/28/2020   Secondary hypercoagulable state (HCC) 07/20/2020   Paroxysmal atrial fibrillation (HCC) 06/19/2020   TIA (transient ischemic attack) 11/05/2019   Essential hypertension 11/05/2019   Cerebellar ataxia (HCC) 11/05/2019   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   C6 radiculopathy 05/04/2019   Pain due to onychomycosis of toenails of both feet 04/28/2019   Worsened handwriting 03/16/2019   Gait disturbance 03/16/2019   Slurred speech 03/16/2019   Memory loss 03/16/2019   Pain in left ankle and joints of left  foot 08/06/2018   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   Foot pain, left 06/04/2016   Dysesthesia 06/04/2016   Superficial peroneal nerve neuropathy, left 06/04/2016   History of cervical spinal surgery 06/04/2016    ONSET DATE: Referral date 06-26-23  REFERRING DIAG: Diagnosis G11.8 (ICD-10-CM) - Spinocerebellar ataxia (HCC) R26.9 (ICD-10-CM) - Gait disturbance  THERAPY DIAG:  Other abnormalities of gait and mobility  Unsteadiness on feet  Other lack of coordination  Rationale for Evaluation and  Treatment: Rehabilitation  SUBJECTIVE:                                                                                                                                                                                             SUBJECTIVE STATEMENT:  Pt reports he is doing fine today - no changes or problems reported.  Discussed pt's schedule with pt having 1 more land appt next week and that is end of this POC; pt requests to continue with land PT if possible for a few more sessions - requests land - and probably not aquatic PT as this would be something he probably will not continue upon discharge  Per chart note:  "Update  06/26/2023" from Dr. Godwin Lat He feels his symptoms are mostly stable.  Balance is poor and he uses a rolling walker outside but walls/furniture or cane indoors.  He has no recent falls but has a lot of stumbles.   He has difficulty with slurred speech and swallowing.  Handwriting is poor abut able to feed himself. His family is still a walk in tub with grab bars.   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 NCV/EMG study did not show any evidence of motor neuron disease. We did the [employer] SCA panel (30+ genes). It was borderline. 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 No  PRECAUTIONS: Fall  RED FLAGS: None   WEIGHT BEARING RESTRICTIONS: No  FALLS: Has patient fallen in last 6 months? No  LIVING ENVIRONMENT: Lives with: lives with their spouse Lives in: House/apartment Stairs: Yes: Internal: 12 steps; can reach both and External: 2 steps; can reach both - in back; pt reports he does not access 2nd level of home - has bedroom on lower level Has following equipment at home: Otho Blitz - 4 wheeled, walking canes  PLOF: Independent with basic ADLs, Independent with household mobility with device, Independent with community mobility with device, and Needs assistance with homemaking  PATIENT GOALS: get around better and not fall - "stay out of a wheelchair"  OBJECTIVE:  Note: Objective measures were completed at Evaluation unless otherwise noted.  DIAGNOSTIC FINDINGS: N/A (none recently)  COGNITION: Overall cognitive status: Within functional limits for tasks assessed   SENSATION: WFL  COORDINATION: Decreased bil. LE's due to ataxia  POSTURE: rounded shoulders and forward head  LOWER EXTREMITY ROM:   WFL's bil. LE's   LOWER EXTREMITY MMT:    MMT Right Eval Left Eval  Hip flexion 5 5  Hip extension    Hip abduction    Hip adduction    Hip internal rotation    Hip external rotation    Knee flexion 5 5  Knee extension 5 5  Ankle dorsiflexion 5 5  Ankle plantarflexion    Ankle inversion    Ankle eversion    (Blank rows = not tested)  BED MOBILITY:  Modified independent  TRANSFERS: Assistive device utilized: Environmental consultant - 4 wheeled  Sit to stand: Modified independence Stand to sit: Modified independence  GAIT: Gait pattern: ataxic Distance walked: 100' Assistive device utilized: Environmental consultant - 4 wheeled Level of  assistance: SBA Comments: needs cues for correct hand placement for safety with sit > stand transfers  FUNCTIONAL TESTS:  5 times sit to stand: 13.57 secs without UE support from mat Timed up and go (TUG): 24.12 secs 10 meter walk test: 18.19  secs = 1.80 ft/sec with RW                                                                                                                              TREATMENT DATE: 09-03-23    Aquatic PT at [facility] - pool temp 90 degrees  Patient seen for aquatic therapy today.  Treatment took place in water 3.6 - 4.5 feet deep depending upon activity.  Pt entered the pool via step negotiation with use of hand rails with CGA for stability with descending steps to prevent slipping; SBA with use of hand rails with ascending steps for exiting pool - step by step sequence used with descension; step over step sequence used with ascension when exiting pool.  Pt performed water walking for warm up - forwards, backwards and sideways 18' x 4 reps each direction  across width of pool - no floatation device used   Marching in place 10 reps each leg with UE support on pool edge for assist with balance; marching forwads across pool 18' x 2 reps with UE support on barbells with SBA  Core stabilization exercises; pt stood statically with feet shoulder width apart -  -pt performed stepping forward back pushing barbells forward/back 10 reps - RLE and LLE       Performed stepping laterally (out/in) 10 reps each leg/moving barbells out to side in horizontal abdct/adduction 10 reps each leg       Performed stepping back/forward 10 reps each leg - minimal UE movement with barbells  Balance exercises - pt performed alternating forward, side and backward kicks 10 reps each leg - holding onto pool edge for initial 5 reps, then less UE support as able for 2nd 5 reps for increased challenge with standing balance  Pt stood with feet together - cues to tighten core - holding small  barbells  Ai Chi posture "Balancing" at end of session - pt performed each side (RUE/RLE) separately with tactile cues for correct sequence initially and performed without cueing but with UE support on pool edge for assist with balance - performed 10 reps each side  Pt requires buoyancy of water for support for reduced fall risk with balance exercises and activities as pt able to safely attempt and perform exercises in water compared to fall risk factor with performing same exercises on land.  Viscosity of water is needed for resistance for strengthening and current of water provides perturbations for challenge for balance training.  Pt is able to safely perform ambulation without device in water which is unable to be attempted safely on land.     PATIENT EDUCATION: Education details: Eval results with recommended POC: discussed aquatic therapy and provided information/handout as pt is interested in this service Person educated: Patient Education method: Explanation and Handouts Education comprehension: verbalized understanding  HOME EXERCISE PROGRAM: To be established  GOALS: Goals reviewed with patient? Yes  SHORT TERM GOALS: SAME AS LTG'S   LONG TERM GOALS: Target date: 09-12-23  Improve 5x sit to stand score to </= 11 secs without UE support from mat table. Baseline: 13.57 secs without UE support Goal status: INITIAL  2.  Improve Timed Up and Go score to </= 20 secs with use of RW to demo improved mobility and reduced fall risk. Baseline:  24.12 secs with RW - incorrect hand placement Goal status: INITIAL  3.  Improve gait velocity to >/= 2.0 ft/sec with RW for increased gait efficiency. Baseline:  18.19 secs = 1.80 ft/sec Goal status: INITIAL  4.  Pt will participate in aquatic therapy session and determine if this would be a preferred ongoing exercise program upon D/C.  Baseline:  Goal status: INITIAL  5.  Independent in HEP for land based balance exercises (and for  aquatic exercises if pt requests to continue aquatic exercise upon D/C). Baseline:  Goal status: INITIAL  ASSESSMENT:  CLINICAL IMPRESSION: Aquatic PT session focused on balance and coordination exercises and water walking in various directions.  Pt demonstrated improvement in balance with LE kicks with pt able to hold pool edge less on 2nd set of 5 reps than on initial set.  Pt also demonstrated improvement in balance with backwards walking with increased control noted in today's session.  Pt continues to need intermittent UE support with SLS activities and has difficulty performing coordinated reciprocal UE movement with LE movement due  to the ataxia.  Pt is progressing with aquatic exercises.  Cont with POC.   OBJECTIVE IMPAIRMENTS: Abnormal gait, decreased balance, decreased coordination, and decreased safety awareness.   ACTIVITY LIMITATIONS: carrying, bending, squatting, and locomotion level  PARTICIPATION LIMITATIONS: shopping, community activity, and yard work  PERSONAL FACTORS: Time since onset of injury/illness/exacerbation and 1 comorbidity: cerebellar ataxia  are also affecting patient's functional outcome.   REHAB POTENTIAL: Fair due to progressive nature of disease  CLINICAL DECISION MAKING: Evolving/moderate complexity  EVALUATION COMPLEXITY: Moderate  PLAN:  PT FREQUENCY: 2x/week  PT DURATION: 4 weeks + eval  PLANNED INTERVENTIONS: 97110-Therapeutic exercises, 97530- Therapeutic activity, [id number]- Neuromuscular re-education, [phone]- Self Care, [id number]- Gait training, and [id number]- Aquatic Therapy  PLAN FOR NEXT SESSION: Land - check LTG's - renew vs. D/C -- continue balance/coordination exercises                    Aquatic - balance and gait training   Rendy Lazard, Celeste Cola, PT 09/03/2023, 7:52 PM

## 2023-09-08 ENCOUNTER — Ambulatory Visit: Admitting: Physical Therapy

## 2023-09-08 DIAGNOSIS — R2681 Unsteadiness on feet: Secondary | ICD-10-CM

## 2023-09-08 DIAGNOSIS — R278 Other lack of coordination: Secondary | ICD-10-CM

## 2023-09-08 DIAGNOSIS — R2689 Other abnormalities of gait and mobility: Secondary | ICD-10-CM | POA: Diagnosis not present

## 2023-09-08 NOTE — Therapy (Unsigned)
 OUTPATIENT PHYSICAL THERAPY NEURO/AQUATIC THERAPY TREATMENT NOTE   Patient Name: Derrick White MRN: [id number] DOB:03/13/1941, 83 y.o., male 76 Date: 09/08/2023   PCP: Suzzanne Estrin., MD REFERRING PROVIDER: Jorie Newness, MD  END OF SESSION:    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   Memory loss 03/16/2019   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   Pain due to onychomycosis of toenails of both feet 04/28/2019   Pain in left ankle and joints of left foot 08/06/2018   Slurred speech 03/16/2019   Superficial peroneal nerve neuropathy, 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   TIA (transient ischemic attack) 11/05/2019   Worsened handwriting 03/16/2019   Past Surgical History:  Procedure Laterality Date   APPENDECTOMY     1940s   BACK SURGERY     1981, 1998   CARDIOVERSION N/A 09/13/2020   Procedure: CARDIOVERSION;  Surgeon: Luana Rumple, MD;  Location: [facility] ENDOSCOPY;  Service: Cardiovascular;  Laterality: N/A;   FRACTURE SURGERY     Left ankle, sx x4   INGUINAL HERNIA REPAIR Right 03/05/2019   Procedure: OPEN RIGHT INGUINAL HERNIA REPAIR WITH MESH;  Surgeon: Adalberto Acton, MD;  Location: [facility] OR;  Service: General;  Laterality: Right;   KNEE SURGERY Left 1985   LEFT ATRIAL APPENDAGE OCCLUSION N/A 07/27/2020   Procedure: LEFT ATRIAL APPENDAGE OCCLUSION;  Surgeon: Arnoldo Lapping, MD;  Location: [facility] INVASIVE CV LAB;  Service: Cardiovascular;  Laterality: N/A;   SHOULDER SURGERY Left    TEE WITHOUT CARDIOVERSION N/A 07/27/2020   Procedure: TRANSESOPHAGEAL ECHOCARDIOGRAM (TEE);  Surgeon: Arnoldo Lapping, MD;  Location: [facility] INVASIVE CV LAB;  Service:  Cardiovascular;  Laterality: N/A;   TEE WITHOUT CARDIOVERSION N/A 09/13/2020   Procedure: TRANSESOPHAGEAL ECHOCARDIOGRAM (TEE);  Surgeon: Luana Rumple, MD;  Location: [facility] ENDOSCOPY;  Service: Cardiovascular;  Laterality: N/A;   TONSILLECTOMY     1945   Patient Active Problem List   Diagnosis Date Noted   Pain in right shoulder 07/31/2021   Persistent atrial fibrillation (HCC) 08/28/2020   Secondary hypercoagulable state (HCC) 07/20/2020   Paroxysmal atrial fibrillation (HCC) 06/19/2020   TIA (transient ischemic attack) 11/05/2019   Essential hypertension 11/05/2019   Cerebellar ataxia (HCC) 11/05/2019   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   C6 radiculopathy 05/04/2019   Pain due to onychomycosis of toenails of both feet 04/28/2019   Worsened handwriting 03/16/2019   Gait disturbance 03/16/2019   Slurred speech 03/16/2019   Memory loss 03/16/2019   Pain in left ankle and joints of left foot 08/06/2018   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   Foot pain, left 06/04/2016   Dysesthesia 06/04/2016   Superficial peroneal nerve neuropathy, left 06/04/2016   History of cervical spinal surgery 06/04/2016    ONSET DATE: Referral date 06-26-23  REFERRING DIAG: Diagnosis G11.8 (ICD-10-CM) - Spinocerebellar ataxia (HCC) R26.9 (ICD-10-CM) - Gait disturbance  THERAPY DIAG:  No diagnosis found.  Rationale for Evaluation and Treatment: Rehabilitation  SUBJECTIVE:  SUBJECTIVE STATEMENT:  Pt reports he is doing fine today - no changes or problems reported.  Discussed pt's schedule with pt having 1 more land appt next week and that is end of this POC; pt requests to continue with land PT if possible for a few more sessions - requests land - and probably not aquatic PT as this would be something he  probably will not continue upon discharge  Per chart note:  "Update  06/26/2023" from Dr. Godwin Lat He feels his symptoms are mostly stable.  Balance is poor and he uses a rolling walker outside but walls/furniture or cane indoors.  He has no recent falls but has a lot of stumbles.   He has difficulty with slurred speech and swallowing.  Handwriting is poor abut able to feed himself. His family is still a walk in tub with grab bars.   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 NCV/EMG study did not show any evidence of motor neuron disease. We did the [employer] SCA panel (30+ genes). It was borderline. 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 No  PRECAUTIONS: Fall  RED FLAGS: None   WEIGHT BEARING RESTRICTIONS: No  FALLS: Has patient fallen in last 6 months? No  LIVING ENVIRONMENT: Lives with: lives with their spouse Lives in: House/apartment Stairs: Yes: Internal: 12 steps; can reach both and External: 2 steps; can reach both - in back; pt reports he does not access  2nd level of home - has bedroom on lower level Has following equipment at home: Otho Blitz - 4 wheeled, walking canes  PLOF: Independent with basic ADLs, Independent with household mobility with device, Independent with community mobility with device, and Needs assistance with homemaking  PATIENT GOALS: get around better and not fall - "stay out of a wheelchair"  OBJECTIVE:  Note: Objective measures were completed at Evaluation unless otherwise noted.  DIAGNOSTIC FINDINGS: N/A (none recently)  COGNITION: Overall cognitive status: Within functional limits for tasks assessed   SENSATION: WFL  COORDINATION: Decreased bil. LE's due to ataxia  POSTURE: rounded shoulders and forward head  LOWER EXTREMITY ROM:   WFL's bil. LE's   LOWER EXTREMITY MMT:    MMT Right Eval Left Eval  Hip flexion 5 5  Hip extension    Hip abduction    Hip adduction    Hip internal rotation    Hip external rotation    Knee flexion 5 5  Knee extension 5 5  Ankle dorsiflexion 5 5  Ankle plantarflexion    Ankle inversion    Ankle eversion    (Blank rows = not tested)  BED MOBILITY:  Modified independent  TRANSFERS: Assistive device utilized: Environmental consultant - 4 wheeled  Sit to stand: Modified independence Stand to sit: Modified independence  GAIT: Gait pattern: ataxic Distance walked: 100' Assistive device utilized: Environmental consultant - 4 wheeled Level of assistance: SBA Comments: needs cues for correct hand placement for safety with sit > stand transfers  FUNCTIONAL TESTS:  5 times sit to stand: 13.57 secs without UE support from mat Timed up and go (TUG): 24.12 secs 10 meter walk test: 18.19  secs = 1.80 ft/sec with RW                                                                                                                              TREATMENT DATE: 09-03-23    Aquatic PT at [facility] - pool temp 90 degrees  Patient seen for aquatic therapy today.  Treatment took place in water 3.6 - 4.5 feet  deep depending upon activity.  Pt entered the pool via step negotiation with use of hand rails with CGA for stability with descending steps to prevent slipping; SBA with use of hand rails with ascending steps for exiting pool - step by step sequence used with descension; step over step sequence used with ascension when exiting pool.  Pt performed water walking for warm up - forwards, backwards and sideways 18' x 4 reps each direction across width of pool - no floatation device used   Marching in place 10 reps each leg with UE support on pool edge for assist with balance; marching forwads across pool 18' x 2 reps with UE support on barbells with SBA  Core stabilization exercises; pt stood statically with feet shoulder width apart -  -pt performed stepping forward back pushing barbells forward/back 10 reps - RLE and LLE       Performed stepping laterally (out/in) 10 reps each leg/moving barbells out to side in horizontal abdct/adduction 10 reps each leg       Performed stepping back/forward 10 reps each leg - minimal UE movement with barbells  Balance exercises - pt performed alternating forward, side and backward kicks 10 reps each leg - holding onto pool edge for initial 5 reps, then less UE support as able for 2nd 5 reps for increased challenge with standing balance  Pt stood with feet together - cues to tighten core - holding small barbells  Ai Chi posture "Balancing" at end of session - pt performed each side (RUE/RLE) separately with tactile cues for correct sequence initially and performed without cueing but with UE support on pool edge for assist with balance - performed 10 reps each side  Pt requires buoyancy of water for support for reduced fall risk with balance exercises and activities as pt able to safely attempt and perform exercises in water compared to fall risk factor with performing same exercises on land.  Viscosity of water is needed for resistance for strengthening and current of  water provides perturbations for challenge  for balance training.  Pt is able to safely perform ambulation without device in water which is unable to be attempted safely on land.     PATIENT EDUCATION: Education details: Eval results with recommended POC: discussed aquatic therapy and provided information/handout as pt is interested in this service Person educated: Patient Education method: Explanation and Handouts Education comprehension: verbalized understanding  HOME EXERCISE PROGRAM: To be established  GOALS: Goals reviewed with patient? Yes  SHORT TERM GOALS: SAME AS LTG'S   LONG TERM GOALS: Target date: 09-12-23  Improve 5x sit to stand score to </= 11 secs without UE support from mat table. Baseline: 13.57 secs without UE support; 15.47 secs, 12.28  Goal status: INITIAL  2.  Improve Timed Up and Go score to </= 20 secs with use of RW to demo improved mobility and reduced fall risk. Baseline:  24.12 secs with RW - incorrect hand placement;  20.09 Goal status: INITIAL  3.  Improve gait velocity to >/= 2.0 ft/sec with RW for increased gait efficiency. Baseline:  18.19 secs = 1.80 ft/sec; 17.06 Goal status:   4.  Pt will participate in aquatic therapy session and determine if this would be a preferred ongoing exercise program upon D/C.  Baseline:  Goal status: GOAL MET  5.  Independent in HEP for land based balance exercises (and for aquatic exercises if pt requests to continue aquatic exercise upon D/C). Baseline:  Goal status: GOAL MET  ASSESSMENT:  CLINICAL IMPRESSION: Aquatic PT session focused on balance and coordination exercises and water walking in various directions.  Pt demonstrated improvement in balance with LE kicks with pt able to hold pool edge less on 2nd set of 5 reps than on initial set.  Pt also demonstrated improvement in balance with backwards walking with increased control noted in today's session.  Pt continues to need intermittent UE support with SLS  activities and has difficulty performing coordinated reciprocal UE movement with LE movement due to the ataxia.  Pt is progressing with aquatic exercises.  Cont with POC.   OBJECTIVE IMPAIRMENTS: Abnormal gait, decreased balance, decreased coordination, and decreased safety awareness.   ACTIVITY LIMITATIONS: carrying, bending, squatting, and locomotion level  PARTICIPATION LIMITATIONS: shopping, community activity, and yard work  PERSONAL FACTORS: Time since onset of injury/illness/exacerbation and 1 comorbidity: cerebellar ataxia  are also affecting patient's functional outcome.   REHAB POTENTIAL: Fair due to progressive nature of disease  CLINICAL DECISION MAKING: Evolving/moderate complexity  EVALUATION COMPLEXITY: Moderate  PLAN:  PT FREQUENCY: 2x/week  PT DURATION: 4 weeks + eval  PLANNED INTERVENTIONS: 97110-Therapeutic exercises, 97530- Therapeutic activity, [id number]- Neuromuscular re-education, [phone]- Self Care, [id number]- Gait training, and [phone]- Aquatic Therapy  PLAN FOR NEXT SESSION: Land - check LTG's - renew vs. D/C -- continue balance/coordination exercises                    Aquatic - balance and gait training   Zayveon Raschke, Celeste Cola, PT 09/08/2023, 11:04 AM

## 2023-09-09 ENCOUNTER — Encounter: Payer: Self-pay | Admitting: Physical Therapy

## 2023-09-09 NOTE — Therapy (Signed)
 OUTPATIENT PHYSICAL THERAPY NEURO TREATMENT NOTE   Patient Name: Derrick White MRN: [id number] DOB:1940-07-23, 83 y.o., male 63 Date: 09/09/2023   PCP: Suzzanne Estrin., MD REFERRING PROVIDER: Jorie Newness, MD  END OF SESSION:  PT End of Session - 09/09/23 0935     Visit Number 9    Number of Visits 13   renewal completed for 4 additional visits   Date for PT Re-Evaluation 09/12/23    Authorization Type [employer]    Authorization Time Period 08-11-23 - 10-11-23; 09-08-23 - 10-18-23    Progress Note Due on Visit 10    PT Start Time 1105    PT Stop Time 1150    PT Time Calculation (min) 45 min    Equipment Utilized During Treatment Gait belt    Activity Tolerance Patient tolerated treatment well    Behavior During Therapy [facility] for tasks assessed/performed               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   Memory loss 03/16/2019   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   Pain due to onychomycosis of toenails of both feet 04/28/2019   Pain in left ankle and joints of left foot 08/06/2018   Slurred speech 03/16/2019   Superficial peroneal nerve neuropathy, 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   TIA (transient ischemic attack) 11/05/2019   Worsened handwriting 03/16/2019   Past Surgical History:  Procedure Laterality Date   APPENDECTOMY     1940s   BACK SURGERY     1981, 1998   CARDIOVERSION N/A 09/13/2020   Procedure: CARDIOVERSION;  Surgeon: Luana Rumple, MD;  Location: [facility] ENDOSCOPY;  Service: Cardiovascular;  Laterality: N/A;   FRACTURE SURGERY     Left ankle, sx x4   INGUINAL HERNIA REPAIR Right 03/05/2019   Procedure: OPEN  RIGHT INGUINAL HERNIA REPAIR WITH MESH;  Surgeon: Adalberto Acton, MD;  Location: [facility] OR;  Service: General;  Laterality: Right;   KNEE SURGERY Left 1985   LEFT ATRIAL APPENDAGE OCCLUSION N/A 07/27/2020   Procedure: LEFT ATRIAL APPENDAGE OCCLUSION;  Surgeon: Arnoldo Lapping, MD;  Location: [facility] INVASIVE CV LAB;  Service: Cardiovascular;  Laterality: N/A;   SHOULDER SURGERY Left    TEE WITHOUT CARDIOVERSION N/A 07/27/2020   Procedure: TRANSESOPHAGEAL ECHOCARDIOGRAM (TEE);  Surgeon: Arnoldo Lapping, MD;  Location: [facility] INVASIVE CV LAB;  Service: Cardiovascular;  Laterality: N/A;   TEE WITHOUT CARDIOVERSION N/A 09/13/2020   Procedure: TRANSESOPHAGEAL ECHOCARDIOGRAM (TEE);  Surgeon: Luana Rumple, MD;  Location: [facility] ENDOSCOPY;  Service: Cardiovascular;  Laterality: N/A;   TONSILLECTOMY     1945   Patient Active Problem List   Diagnosis Date Noted   Pain in right shoulder 07/31/2021   Persistent atrial fibrillation (HCC) 08/28/2020   Secondary hypercoagulable state (HCC) 07/20/2020   Paroxysmal atrial fibrillation (HCC) 06/19/2020   TIA (transient ischemic attack) 11/05/2019   Essential hypertension 11/05/2019   Cerebellar ataxia (HCC) 11/05/2019   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   C6 radiculopathy 05/04/2019   Pain due to onychomycosis of toenails of both feet 04/28/2019   Worsened handwriting 03/16/2019   Gait disturbance 03/16/2019   Slurred  speech 03/16/2019   Memory loss 03/16/2019   Pain in left ankle and joints of left foot 08/06/2018   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   Foot pain, left 06/04/2016   Dysesthesia 06/04/2016   Superficial peroneal nerve neuropathy, left 06/04/2016   History of cervical spinal surgery 06/04/2016    ONSET DATE: Referral date 06-26-23  REFERRING DIAG: Diagnosis G11.8 (ICD-10-CM) - Spinocerebellar ataxia (HCC) R26.9 (ICD-10-CM) - Gait disturbance  THERAPY DIAG:  Other abnormalities of gait and mobility  Unsteadiness on feet  Other lack  of coordination  Rationale for Evaluation and Treatment: Rehabilitation  SUBJECTIVE:                                                                                                                                                                                             SUBJECTIVE STATEMENT:  Pt denies problems or changes - is doing fine.  Continues to do exercises at home  Per chart note:  "Update  06/26/2023" from Dr. Godwin Lat He feels his symptoms are mostly stable.  Balance is poor and he uses a rolling walker outside but walls/furniture or cane indoors.  He has no recent falls but has a lot of stumbles.   He has difficulty with slurred speech and swallowing.  Handwriting is poor abut able to feed himself. His family is still a walk in tub with grab bars.   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 NCV/EMG study did not show any evidence of motor neuron disease. We did the [employer] SCA panel (30+ genes). It was borderline. 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 Unclear if this is playing a role (SCA type 6 has mutations in this Jaisean but usually triplet repeat)      PAIN:  Are you having pain?  No  PRECAUTIONS: Fall  RED FLAGS: None   WEIGHT BEARING RESTRICTIONS: No  FALLS: Has patient fallen in last  6 months? No  LIVING ENVIRONMENT: Lives with: lives with their spouse Lives in: House/apartment Stairs: Yes: Internal: 12 steps; can reach both and External: 2 steps; can reach both - in back; pt reports he does not access 2nd level of home - has bedroom on lower level Has following equipment at home: Otho Blitz - 4 wheeled, walking canes  PLOF: Independent with basic ADLs, Independent with household mobility with device, Independent with community mobility with device, and Needs assistance with homemaking  PATIENT GOALS: get around better and not fall - "stay out of a wheelchair"  OBJECTIVE:  Note: Objective measures were completed at Evaluation unless otherwise noted.  DIAGNOSTIC FINDINGS: N/A (none recently)  COGNITION: Overall cognitive status: Within functional limits for tasks assessed   SENSATION: WFL  COORDINATION: Decreased bil. LE's due to ataxia  POSTURE: rounded shoulders and forward head  LOWER EXTREMITY ROM:   WFL's bil. LE's   LOWER EXTREMITY MMT:    MMT Right Eval Left Eval  Hip flexion 5 5  Hip extension    Hip abduction    Hip adduction    Hip internal rotation    Hip external rotation    Knee flexion 5 5  Knee extension 5 5  Ankle dorsiflexion 5 5  Ankle plantarflexion    Ankle inversion    Ankle eversion    (Blank rows = not tested)  BED MOBILITY:  Modified independent  TRANSFERS: Assistive device utilized: Environmental consultant - 4 wheeled  Sit to stand: Modified independence Stand to sit: Modified independence  GAIT: Gait pattern: ataxic Distance walked: 100' Assistive device utilized: Environmental consultant - 4 wheeled Level of assistance: SBA Comments: needs cues for correct hand placement for safety with sit > stand transfers  FUNCTIONAL TESTS:  5 times sit to stand: 13.57 secs without UE support from mat Timed up and go (TUG): 24.12 secs 10  meter walk test: 18.19  secs = 1.80 ft/sec with RW                                                                                                                              TREATMENT DATE: 09-08-23  TherEx: 5x sit to stand from mat table - 15.47 secs, 12.28 secs on 2nd trial  SciFit level 3 x 5" with UE and LE's for reciprocal movement/improved coordination  NeuroRe-ed:  Timed Up and Go score  20.09 secs with rollator  Balance exercises performed inside // bars - performed stepping over/back of black balance beam 10 reps each leg with UE support Tap ups to 6" step - straight ahead then diagonal taps with each foot 10 reps each with UE support - used 4" step with the number "4" used as targets to improve coordination  Marching inside // bars 10 reps each leg; added contralateral UE flexion with UE support prn for assist with balance recovery;  tapping contralateral knee with hand for improved coordination Touching 3 colored discs as targets - pt touched  discs in half circle pattern  Standing alternating hip flexion with knee extended 10 reps each LE:  standing alternating hip abduction 10 reps each leg; small range alternating hip extension 10 reps each leg  Gait: Clinic distances with use of rollator with supervision Gait velocity - 17.06 secs = 1.92 ft/sec       Pt performed balance exercises as issued for HEP - at counter with UE support for safety Access Code: [email or web address] URL: [email or web address] Date: 08/18/2023 Prepared by: Johnnette Nakayama  Exercises - Sit to Stand Without Arm Support  - 1 x daily - 7 x weekly - 1 sets - 10 reps - Standing Hip Flexion with Counter Support  - 1 x daily - 7 x weekly - 1 sets - 10 reps - Standing Hip Extension with Counter Support  - 1 x daily - 7 x weekly - 1 sets - 10 reps - Standing Hip Abduction with Counter Support  - 1 x daily - 7 x weekly - 1 sets - 10 reps - Standing March with Counter Support  - 1 x daily - 7 x weekly - 1  sets - 10 reps - Seated Marching with Opposite Shoulder Flexion  - 1 x daily - 7 x weekly - 1 sets - 10 reps - Single Leg Stance with Support  - 1 x daily - 7 x weekly - 1 sets - 2 reps - 10 sec hold - Step Sideways  - 1 x daily - 7 x weekly - 1 sets - 10 reps  Balance exercises performed inside // bars - performed stepping over/back of black balance beam 10 reps each leg with UE support Tap ups to 6" step - straight ahead then diagonal taps with each foot 10 reps each with UE support Marching inside // bars with lifting contralateral UE for improved coordination Touching 2 colored discs as targets - straight ahead 5 reps each foot, then diagonal touches 5 reps each foot with UE support  Gait: Pt gait trained inside // bars forward 10' x 4 reps with use of mirror for visual feedback Backwards 10' x 2 reps without UE support on // bars - with CGA for safety  TherEx; Heel raises 10 reps at counter - bil. LE's -with UE support  SciFit level 4.0 x 5" with bil. Ue's and LE's for reciprocal movement/improved coordination UE's and LE's     PATIENT EDUCATION: Education details:  Balance HEP - Medbridge Person educated: Patient Education method: Explanation and Handouts Education comprehension: verbalized understanding  HOME EXERCISE PROGRAM: To be established  GOALS: Goals reviewed with patient? Yes  SHORT TERM GOALS: SAME AS LTG'S   LONG TERM GOALS: Target date: 09-12-23   Improve 5x sit to stand score to </= 11 secs without UE support from mat table. Baseline: 13.57 secs without UE support;   09-08-23;  15.47 secs, 12.28 secs on 2nd trial  Goal status: Partially met 09-08-23  2.  Improve Timed Up and Go score to </= 20 secs with use of RW to demo improved mobility and reduced fall risk. Baseline:  24.12 secs with RW - incorrect hand placement;  20.09 secs with rollator Goal status: Partially met 09-08-23  3.  Improve gait velocity to >/= 2.0 ft/sec with RW for increased gait  efficiency. Baseline:  18.19 secs = 1.80 ft/sec;   09-08-23 -- 17.06 secs = 1.92 ft/sec  Goal status: Partially met 09-08-23  4.  Pt will participate in aquatic therapy session and determine if this  would be a preferred ongoing exercise program upon D/C.  Baseline:  Goal status: GOAL MET 09-08-23  5.  Independent in HEP for land based balance exercises (and for aquatic exercises if pt requests to continue aquatic exercise upon D/C). Baseline:  Goal status: GOAL MET 09-08-23  New LONG TERM GOALS:  TARGET DATE 10-10-23                                    1.   Improve 5x sit to stand score to </= 11 secs without UE support from mat table. Baseline: 13.57 secs without UE support;   09-08-23;  15.47 secs, 12.28 secs on 2nd trial  Goal status: Partially met 09-08-23 (Goal ongoing)    2.  Improve Timed Up and Go score to </= 20 secs with use of RW to demo improved mobility and reduced fall risk. Baseline:  24.12 secs with RW - incorrect hand placement;  20.09 secs with rollator Goal status: Partially met 09-08-23  (Goal ongoing)     3.  Improve gait velocity to >/= 2.0 ft/sec with RW for increased gait efficiency. Baseline:  18.19 secs = 1.80 ft/sec;   09-08-23 -- 17.06 secs = 1.92 ft/sec  Goal status: Partially met 09-08-23       4.  Independent in updated HEP for land and aquatic based exercises.            Baseline:               Goal status:  NEW   ASSESSMENT:  CLINICAL IMPRESSION: PT session focused on assessment of LTG's for renewal and balance training and coordination exercises in standing with UE support for safety. Pt has partially met LTG's #1-3 and has fully met LTG's #4 and 5.  Pt requests to continue for 2 additional aquatic PT sessions and 2 additional land based PT sessions to continue to address balance and gait deficits.  Cont with POC.   OBJECTIVE IMPAIRMENTS: Abnormal gait, decreased balance, decreased coordination, and decreased safety awareness.   ACTIVITY LIMITATIONS:  carrying, bending, squatting, and locomotion level  PARTICIPATION LIMITATIONS: shopping, community activity, and yard work  PERSONAL FACTORS: Time since onset of injury/illness/exacerbation and 1 comorbidity: cerebellar ataxia  are also affecting patient's functional outcome.   REHAB POTENTIAL: Fair due to progressive nature of disease  CLINICAL DECISION MAKING: Evolving/moderate complexity  EVALUATION COMPLEXITY: Moderate  PLAN:  PT FREQUENCY: 1-2x/week  PT DURATION: 4 weeks per renewal 09-09-23  PLANNED INTERVENTIONS: 97110-Therapeutic exercises, 97530- Therapeutic activity, 97112- Neuromuscular re-education, [phone]- Self Care, 60454- Gait training, and [phone]- Aquatic Therapy  PLAN FOR NEXT SESSION: Land - balance HEP                     Aquatic - balance and gait training   Dorsie Gaunt, PT 09/09/2023, 9:40 AM

## 2023-09-13 ENCOUNTER — Other Ambulatory Visit: Payer: Self-pay | Admitting: Internal Medicine

## 2023-09-15 ENCOUNTER — Ambulatory Visit: Payer: Self-pay | Admitting: Physical Therapy

## 2023-09-15 DIAGNOSIS — R2681 Unsteadiness on feet: Secondary | ICD-10-CM

## 2023-09-15 DIAGNOSIS — R2689 Other abnormalities of gait and mobility: Secondary | ICD-10-CM

## 2023-09-15 DIAGNOSIS — R278 Other lack of coordination: Secondary | ICD-10-CM

## 2023-09-16 ENCOUNTER — Ambulatory Visit: Payer: Self-pay | Admitting: Physical Therapy

## 2023-09-16 ENCOUNTER — Encounter: Payer: Self-pay | Admitting: Physical Therapy

## 2023-09-16 NOTE — Therapy (Signed)
 OUTPATIENT PHYSICAL THERAPY NEURO/AQUATIC THERAPY TREATMENT NOTE/ 10th visit progress note  Progress Note  Reporting Period 08-11-23 to 09-15-23  See note below for Objective Data and Assessment of Progress/Goals.     Patient Name: Derrick White MRN: [id number] DOB:07-Feb-1941, 83 y.o., male Today's Date: 09/16/2023   PCP: Suzzanne Estrin., MD REFERRING PROVIDER: Jorie Newness, MD  END OF SESSION:  PT End of Session - 09/16/23 1934     Visit Number 10    Number of Visits 13   renewal completed for 4 additional visits   Date for PT Re-Evaluation 10/17/23    Authorization Type [employer]    Authorization Time Period 08-11-23 - 10-11-23; 09-08-23 - 10-18-23    Progress Note Due on Visit 10    PT Start Time 1315    PT Stop Time 1400    PT Time Calculation (min) 45 min    Equipment Utilized During Treatment Other (comment)   barbells   Activity Tolerance Patient tolerated treatment well    Behavior During Therapy [facility] for tasks assessed/performed             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   Memory loss 03/16/2019   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   Pain due to onychomycosis of toenails of both feet 04/28/2019   Pain in left ankle and joints of left foot 08/06/2018   Slurred speech 03/16/2019   Superficial peroneal nerve neuropathy, 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   TIA (transient ischemic attack) 11/05/2019   Worsened handwriting 03/16/2019   Past Surgical History:  Procedure Laterality Date   APPENDECTOMY     1940s   BACK SURGERY     1981, 1998   CARDIOVERSION N/A 09/13/2020   Procedure: CARDIOVERSION;  Surgeon: Luana Rumple, MD;  Location: [facility] ENDOSCOPY;  Service: Cardiovascular;  Laterality: N/A;   FRACTURE SURGERY     Left ankle, sx x4   INGUINAL HERNIA REPAIR Right 03/05/2019   Procedure: OPEN RIGHT INGUINAL HERNIA REPAIR WITH MESH;  Surgeon: Adalberto Acton, MD;  Location: [facility] OR;  Service: General;  Laterality: Right;   KNEE SURGERY Left 1985   LEFT ATRIAL APPENDAGE OCCLUSION N/A 07/27/2020   Procedure: LEFT ATRIAL APPENDAGE OCCLUSION;  Surgeon: Arnoldo Lapping, MD;  Location: [facility] INVASIVE CV LAB;  Service: Cardiovascular;  Laterality: N/A;   SHOULDER SURGERY Left    TEE WITHOUT CARDIOVERSION N/A 07/27/2020   Procedure: TRANSESOPHAGEAL ECHOCARDIOGRAM (TEE);  Surgeon: Arnoldo Lapping, MD;  Location: [facility] INVASIVE CV LAB;  Service: Cardiovascular;  Laterality: N/A;   TEE WITHOUT CARDIOVERSION N/A 09/13/2020   Procedure: TRANSESOPHAGEAL ECHOCARDIOGRAM (TEE);  Surgeon: Luana Rumple, MD;  Location: [facility] ENDOSCOPY;  Service: Cardiovascular;  Laterality: N/A;   TONSILLECTOMY     1945   Patient Active Problem List   Diagnosis Date Noted   Pain in right shoulder 07/31/2021   Persistent atrial fibrillation (HCC) 08/28/2020   Secondary hypercoagulable state (HCC) 07/20/2020   Paroxysmal atrial fibrillation (HCC) 06/19/2020   TIA (transient ischemic attack) 11/05/2019   Essential hypertension 11/05/2019   Cerebellar ataxia (HCC) 11/05/2019   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   C6  radiculopathy 05/04/2019   Pain due to onychomycosis of toenails of both feet 04/28/2019   Worsened handwriting 03/16/2019   Gait disturbance 03/16/2019   Slurred speech 03/16/2019   Memory loss 03/16/2019   Pain in left ankle and joints of left foot 08/06/2018   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   Foot pain, left 06/04/2016   Dysesthesia 06/04/2016   Superficial peroneal nerve neuropathy, left 06/04/2016   History of cervical spinal surgery 06/04/2016    ONSET DATE: Referral date 06-26-23  REFERRING DIAG:  Diagnosis G11.8 (ICD-10-CM) - Spinocerebellar ataxia (HCC) R26.9 (ICD-10-CM) - Gait disturbance  THERAPY DIAG:  Other abnormalities of gait and mobility  Unsteadiness on feet  Other lack of coordination  Rationale for Evaluation and Treatment: Rehabilitation  SUBJECTIVE:                                                                                                                                                                                             SUBJECTIVE STATEMENT:  Pt reports no problems or changes - is doing about the same  Per chart note:  "Update  06/26/2023" from Dr. Godwin Lat He feels his symptoms are mostly stable.  Balance is poor and he uses a rolling walker outside but walls/furniture or cane indoors.  He has no recent falls but has a lot of stumbles.   He has difficulty with slurred speech and swallowing.  Handwriting is poor abut able to feed himself. His family is still a walk in tub with grab bars.   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 NCV/EMG study did not show any evidence of motor neuron disease. We did the [employer] SCA panel (30+ genes). It was borderline. 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 Unclear if this is playing a role (SCA type 6 has mutations in this Jerid but usually triplet repeat)      PAIN:  Are you  having pain? No  PRECAUTIONS: Fall  RED FLAGS: None   WEIGHT BEARING RESTRICTIONS: No  FALLS: Has patient fallen in last 6 months? No  LIVING ENVIRONMENT: Lives with: lives with their spouse Lives in: House/apartment Stairs: Yes: Internal: 12 steps; can reach both and External: 2 steps; can reach both - in back; pt reports he does not access 2nd level of home - has bedroom on lower level Has following equipment at home: Otho Blitz - 4 wheeled, walking canes  PLOF: Independent with basic ADLs, Independent with household mobility with device, Independent with community mobility with device, and Needs assistance with homemaking  PATIENT GOALS: get around better and not fall - "stay out of a wheelchair"  OBJECTIVE:  Note: Objective measures were completed at Evaluation unless otherwise noted.  DIAGNOSTIC FINDINGS: N/A (none recently)  COGNITION: Overall cognitive status: Within functional limits for tasks assessed   SENSATION: WFL  COORDINATION: Decreased bil. LE's due to ataxia  POSTURE: rounded shoulders and forward head  LOWER EXTREMITY ROM:   WFL's bil. LE's   LOWER EXTREMITY MMT:    MMT Right Eval Left Eval  Hip flexion 5 5  Hip extension    Hip abduction    Hip adduction    Hip internal rotation    Hip external rotation    Knee flexion 5 5  Knee extension 5 5  Ankle dorsiflexion 5 5  Ankle plantarflexion    Ankle inversion    Ankle eversion    (Blank rows = not tested)  BED MOBILITY:  Modified independent  TRANSFERS: Assistive device utilized: Environmental consultant - 4 wheeled  Sit to stand: Modified independence Stand to sit: Modified independence  GAIT: Gait pattern: ataxic Distance walked: 100' Assistive device utilized: Environmental consultant - 4 wheeled Level of assistance: SBA Comments: needs cues for correct  hand placement for safety with sit > stand transfers  FUNCTIONAL TESTS:  5 times sit to stand: 13.57 secs without UE support from mat Timed up and go (TUG): 24.12 secs 10 meter walk test: 18.19  secs = 1.80 ft/sec with RW                                                                                                                              TREATMENT DATE: 09-15-23    Aquatic PT at [facility] - pool temp 90 degrees  Patient seen for aquatic therapy today.  Treatment took place in water 3.6 - 4.5 feet deep depending upon activity.  Pt entered the pool via step negotiation with use of hand rails with CGA for stability with descending steps to prevent slipping; SBA with use of hand rails with ascending steps for exiting pool - step by step sequence used with descension; step over step sequence used with ascension when exiting pool.  Pt performed water walking for warm up - forwards, backwards and sideways 18' x 4 reps each direction across width of pool - no floatation device used   Marching  in place 10 reps each leg with UE support on pool edge for assist with balance; marching forwads across pool 18' x 2 reps with UE support on barbells with SBA  Core stabilization exercises; pt stood statically with feet shoulder width apart -  -pt performed stepping forward back pushing barbells forward/back 10 reps - RLE and LLE       Performed stepping laterally (out/in) 10 reps each leg/moving barbells out to side in horizontal abdct/adduction 10 reps each leg       Performed stepping back/forward 10 reps each leg - minimal UE movement with barbells  Balance exercises - pt performed alternating forward, side and backward kicks 10 reps each leg - holding onto pool edge for initial 5 reps, then less UE support as able for 2nd 5 reps for increased challenge with standing balance  Pt stood with feet together - cues to tighten core - holding small barbells  Pt requires buoyancy of water for support for  reduced fall risk with balance exercises and activities as pt able to safely attempt and perform exercises in water compared to fall risk factor with performing same exercises on land.  Viscosity of water is needed for resistance for strengthening and current of water provides perturbations for challenge for balance training.  Pt is able to safely perform ambulation without device in water which is unable to be attempted safely on land.     PATIENT EDUCATION: Education details: Eval results with recommended POC: discussed aquatic therapy and provided information/handout as pt is interested in this service Person educated: Patient Education method: Explanation and Handouts Education comprehension: verbalized understanding  HOME EXERCISE PROGRAM: To be established  GOALS: Goals reviewed with patient? Yes  SHORT TERM GOALS: SAME AS LTG'S   LONG TERM GOALS:    Target date: 09-12-23  Improve 5x sit to stand score to </= 11 secs without UE support from mat table. Baseline: 13.57 secs without UE support;   09-08-23;  15.47 secs, 12.28 secs on 2nd trial  Goal status: Partially met 09-08-23  2.  Improve Timed Up and Go score to </= 20 secs with use of RW to demo improved mobility and reduced fall risk. Baseline:  24.12 secs with RW - incorrect hand placement;  20.09 secs with rollator Goal status: Partially met 09-08-23  3.  Improve gait velocity to >/= 2.0 ft/sec with RW for increased gait efficiency. Baseline:  18.19 secs = 1.80 ft/sec;   09-08-23 -- 17.06 secs = 1.92 ft/sec  Goal status: Partially met 09-08-23  4.  Pt will participate in aquatic therapy session and determine if this would be a preferred ongoing exercise program upon D/C.  Baseline:  Goal status: GOAL MET 09-08-23  5.  Independent in HEP for land based balance exercises (and for aquatic exercises if pt requests to continue aquatic exercise upon D/C). Baseline:  Goal status: GOAL MET 09-08-23  New LONG TERM GOALS:  TARGET DATE  10-10-23                                    1.   Improve 5x sit to stand score to </= 11 secs without UE support from mat table. Baseline: 13.57 secs without UE support;   09-08-23;  15.47 secs, 12.28 secs on 2nd trial  Goal status: Partially met 09-08-23 (Goal ongoing)    2.  Improve Timed Up and Go score to </= 20 secs  with use of RW to demo improved mobility and reduced fall risk. Baseline:  24.12 secs with RW - incorrect hand placement;  20.09 secs with rollator Goal status: Partially met 09-08-23  (Goal ongoing)     3.  Improve gait velocity to >/= 2.0 ft/sec with RW for increased gait efficiency. Baseline:  18.19 secs = 1.80 ft/sec;   09-08-23 -- 17.06 secs = 1.92 ft/sec  Goal status: Partially met 09-08-23       4.  Independent in updated HEP for land and aquatic based exercises.            Baseline:               Goal status:  NEW    ASSESSMENT:  CLINICAL IMPRESSION: This 10th visit progress note covers dates 08-11-23 - 09-15-23.  Pt has met LTG's #4 and 5 and has partially met LTG's #1-3.  Today's aquatic PT session focused on balance and coordination exercises and water walking in various directions.  Pt also demonstrated improvement in balance with backwards walking with no LOB occurring with this exercise.   Pt continues to need intermittent UE support with SLS activities.  Pt is progressing with aquatic exercises.  Cont with POC.   OBJECTIVE IMPAIRMENTS: Abnormal gait, decreased balance, decreased coordination, and decreased safety awareness.   ACTIVITY LIMITATIONS: carrying, bending, squatting, and locomotion level  PARTICIPATION LIMITATIONS: shopping, community activity, and yard work  PERSONAL FACTORS: Time since onset of injury/illness/exacerbation and 1 comorbidity: cerebellar ataxia  are also affecting patient's functional outcome.   REHAB POTENTIAL: Fair due to progressive nature of disease  CLINICAL DECISION MAKING: Evolving/moderate complexity  EVALUATION  COMPLEXITY: Moderate  PLAN:  PT FREQUENCY: 2x/week  PT DURATION: 4 weeks + eval  PLANNED INTERVENTIONS: 97110-Therapeutic exercises, 97530- Therapeutic activity, [id number]- Neuromuscular re-education, [phone]- Self Care, 13244- Gait training, and [phone]- Aquatic Therapy  PLAN FOR NEXT SESSION:  continue balance/coordination exercises                    Aquatic - balance and gait training   Demetress Tift, Celeste Cola, PT 09/16/2023, 7:38 PM

## 2023-09-22 ENCOUNTER — Ambulatory Visit: Payer: Self-pay | Attending: Neurology | Admitting: Physical Therapy

## 2023-09-22 DIAGNOSIS — R2689 Other abnormalities of gait and mobility: Secondary | ICD-10-CM | POA: Insufficient documentation

## 2023-09-22 DIAGNOSIS — R278 Other lack of coordination: Secondary | ICD-10-CM | POA: Insufficient documentation

## 2023-09-22 DIAGNOSIS — R2681 Unsteadiness on feet: Secondary | ICD-10-CM | POA: Insufficient documentation

## 2023-09-22 DIAGNOSIS — M6281 Muscle weakness (generalized): Secondary | ICD-10-CM | POA: Diagnosis not present

## 2023-09-23 ENCOUNTER — Encounter: Payer: Self-pay | Admitting: Physical Therapy

## 2023-09-23 NOTE — Therapy (Signed)
 OUTPATIENT PHYSICAL THERAPY NEURO TREATMENT NOTE   Patient Name: Derrick White MRN: [id number] DOB:1940/12/27, 83 y.o., male 85 Date: 09/23/2023   PCP: Suzzanne Estrin., MD REFERRING PROVIDER: Jorie Newness, MD  END OF SESSION:  PT End of Session - 09/23/23 1403     Visit Number 11    Number of Visits 13   renewal completed for 4 additional visits   Date for PT Re-Evaluation 10/17/23    Authorization Type [employer]    Authorization Time Period 08-11-23 - 10-11-23; 09-08-23 - 10-18-23    Progress Note Due on Visit 10    PT Start Time 1102    PT Stop Time 1146    PT Time Calculation (min) 44 min    Equipment Utilized During Treatment Gait belt    Activity Tolerance Patient tolerated treatment well    Behavior During Therapy [facility] for tasks assessed/performed               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   Memory loss 03/16/2019   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   Pain due to onychomycosis of toenails of both feet 04/28/2019   Pain in left ankle and joints of left foot 08/06/2018   Slurred speech 03/16/2019   Superficial peroneal nerve neuropathy, 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   TIA (transient ischemic attack) 11/05/2019   Worsened handwriting 03/16/2019   Past Surgical History:  Procedure Laterality Date   APPENDECTOMY     1940s   BACK SURGERY     1981, 1998   CARDIOVERSION N/A 09/13/2020   Procedure: CARDIOVERSION;  Surgeon: Luana Rumple, MD;  Location: [facility] ENDOSCOPY;  Service: Cardiovascular;  Laterality: N/A;   FRACTURE SURGERY     Left ankle, sx x4   INGUINAL HERNIA REPAIR Right 03/05/2019   Procedure: OPEN  RIGHT INGUINAL HERNIA REPAIR WITH MESH;  Surgeon: Adalberto Acton, MD;  Location: [facility] OR;  Service: General;  Laterality: Right;   KNEE SURGERY Left 1985   LEFT ATRIAL APPENDAGE OCCLUSION N/A 07/27/2020   Procedure: LEFT ATRIAL APPENDAGE OCCLUSION;  Surgeon: Arnoldo Lapping, MD;  Location: [facility] INVASIVE CV LAB;  Service: Cardiovascular;  Laterality: N/A;   SHOULDER SURGERY Left    TEE WITHOUT CARDIOVERSION N/A 07/27/2020   Procedure: TRANSESOPHAGEAL ECHOCARDIOGRAM (TEE);  Surgeon: Arnoldo Lapping, MD;  Location: [facility] INVASIVE CV LAB;  Service: Cardiovascular;  Laterality: N/A;   TEE WITHOUT CARDIOVERSION N/A 09/13/2020   Procedure: TRANSESOPHAGEAL ECHOCARDIOGRAM (TEE);  Surgeon: Luana Rumple, MD;  Location: [facility] ENDOSCOPY;  Service: Cardiovascular;  Laterality: N/A;   TONSILLECTOMY     1945   Patient Active Problem List   Diagnosis Date Noted   Pain in right shoulder 07/31/2021   Persistent atrial fibrillation (HCC) 08/28/2020   Secondary hypercoagulable state (HCC) 07/20/2020   Paroxysmal atrial fibrillation (HCC) 06/19/2020   TIA (transient ischemic attack) 11/05/2019   Essential hypertension 11/05/2019   Cerebellar ataxia (HCC) 11/05/2019   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   C6 radiculopathy 05/04/2019   Pain due to onychomycosis of toenails of both feet 04/28/2019   Worsened handwriting 03/16/2019   Gait disturbance 03/16/2019   Slurred  speech 03/16/2019   Memory loss 03/16/2019   Pain in left ankle and joints of left foot 08/06/2018   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   Foot pain, left 06/04/2016   Dysesthesia 06/04/2016   Superficial peroneal nerve neuropathy, left 06/04/2016   History of cervical spinal surgery 06/04/2016    ONSET DATE: Referral date 06-26-23  REFERRING DIAG: Diagnosis G11.8 (ICD-10-CM) - Spinocerebellar ataxia (HCC) R26.9 (ICD-10-CM) - Gait disturbance  THERAPY DIAG:  Other abnormalities of gait and mobility  Unsteadiness on feet  Other lack  of coordination  Rationale for Evaluation and Treatment: Rehabilitation  SUBJECTIVE:                                                                                                                                                                                             SUBJECTIVE STATEMENT:  Pt reports he fell in his garage a few days ago but did not get hurt- says he did hit his head but he was alright; did not go get checked out because he was fine; says he is a little sore today but not too bad  Per chart note:  "Update  06/26/2023" from Dr. Godwin Lat He feels his symptoms are mostly stable.  Balance is poor and he uses a rolling walker outside but walls/furniture or cane indoors.  He has no recent falls but has a lot of stumbles.   He has difficulty with slurred speech and swallowing.  Handwriting is poor abut able to feed himself. His family is still a walk in tub with grab bars.   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 NCV/EMG study did not show any evidence of motor neuron disease. We did the [employer] SCA panel (30+ genes). It was borderline. 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 Unclear if  this is playing a role (SCA type 6 has mutations in this Dashiel but usually triplet repeat)  PAIN:  Are you having pain? No  PRECAUTIONS: Fall  RED FLAGS: None   WEIGHT BEARING RESTRICTIONS: No  FALLS: Has patient fallen in last 6 months? No  LIVING ENVIRONMENT: Lives with: lives with their spouse Lives in: House/apartment Stairs: Yes: Internal: 12 steps; can reach both and External: 2 steps; can reach both - in back; pt reports he does not access 2nd level of home - has bedroom on lower level Has following equipment at home: Otho Blitz - 4 wheeled, walking canes  PLOF: Independent with basic ADLs, Independent with household mobility with device, Independent with community mobility with device, and Needs assistance with homemaking  PATIENT GOALS: get around better and not fall - "stay out of a wheelchair"  OBJECTIVE:  Note: Objective measures were completed at Evaluation unless otherwise noted.  DIAGNOSTIC FINDINGS: N/A (none recently)  COGNITION: Overall cognitive status: Within functional limits for tasks assessed   SENSATION: WFL  COORDINATION: Decreased bil. LE's due to ataxia  POSTURE: rounded shoulders and forward head  LOWER EXTREMITY ROM:   WFL's bil. LE's   LOWER EXTREMITY MMT:    MMT Right Eval Left Eval  Hip flexion 5 5  Hip extension    Hip abduction    Hip adduction    Hip internal rotation    Hip external rotation    Knee flexion 5 5  Knee extension 5 5  Ankle dorsiflexion 5 5  Ankle plantarflexion    Ankle inversion    Ankle eversion    (Blank rows = not tested)  BED MOBILITY:  Modified independent  TRANSFERS: Assistive device utilized: Environmental consultant - 4 wheeled  Sit to stand: Modified independence Stand to sit: Modified independence  GAIT: Gait pattern: ataxic Distance walked: 100' Assistive device utilized: Environmental consultant - 4 wheeled Level of assistance: SBA Comments: needs cues for correct hand placement for safety with sit > stand  transfers  FUNCTIONAL TESTS:  5 times sit to stand: 13.57 secs without UE support from mat Timed up and go (TUG): 24.12 secs 10 meter walk test: 18.19  secs = 1.80 ft/sec with RW                                                                                                                              TREATMENT DATE: 09-22-23  TherEx: 5x sit to stand from mat table without UE support from mat table   NeuroRe-ed:   Rockerboard - performed inside // bars - with bil. UE support - 10 reps x 2 sets anterior/posteriorly  Balance exercises performed inside // bars - performed stepping over/back of black balance beam 10 reps each leg with UE support  Tap ups to 6" step - straight ahead to step with UE support prn hand rails  Marching inside // bars on Airex with UE support on // bars  Touching 3 colored discs as targets - pt touched discs in half circle pattern   Touching 2 cones - tapping top  of each cone straight ahead 5 reps each foot with UE support on // bars  Stepping over and back of black balance beam with UE support on // bars 10 reps each leg  Gait: Clinic distances with use of rollator with supervision    Pt performed balance exercises as issued for HEP - at counter with UE support for safety Access Code: [email or web address] URL: [email or web address] Date: 08/18/2023 Prepared by: Johnnette Nakayama  Exercises - Sit to Stand Without Arm Support  - 1 x daily - 7 x weekly - 1 sets - 10 reps - Standing Hip Flexion with Counter Support  - 1 x daily - 7 x weekly - 1 sets - 10 reps - Standing Hip Extension with Counter Support  - 1 x daily - 7 x weekly - 1 sets - 10 reps - Standing Hip Abduction with Counter Support  - 1 x daily - 7 x weekly - 1 sets - 10 reps - Standing March with Counter Support  - 1 x daily - 7 x weekly - 1 sets - 10 reps - Seated Marching with Opposite Shoulder Flexion  - 1 x daily - 7 x weekly - 1 sets - 10 reps - Single Leg Stance with Support  - 1 x  daily - 7 x weekly - 1 sets - 2 reps - 10 sec hold - Step Sideways  - 1 x daily - 7 x weekly - 1 sets - 10 reps  Balance exercises performed inside // bars - performed stepping over/back of black balance beam 10 reps each leg with UE support Tap ups to 6" step - straight ahead then diagonal taps with each foot 10 reps each with UE support Marching inside // bars with lifting contralateral UE for improved coordination Touching 2 colored discs as targets - straight ahead 5 reps each foot, then diagonal touches 5 reps each foot with UE support       PATIENT EDUCATION: Education details:  Balance HEP - Medbridge Person educated: Patient Education method: Explanation and Handouts Education comprehension: verbalized understanding  HOME EXERCISE PROGRAM: To be established  GOALS: Goals reviewed with patient? Yes  SHORT TERM GOALS: SAME AS LTG'S   LONG TERM GOALS: Target date: 09-12-23   Improve 5x sit to stand score to </= 11 secs without UE support from mat table. Baseline: 13.57 secs without UE support;   09-08-23;  15.47 secs, 12.28 secs on 2nd trial  Goal status: Partially met 09-08-23  2.  Improve Timed Up and Go score to </= 20 secs with use of RW to demo improved mobility and reduced fall risk. Baseline:  24.12 secs with RW - incorrect hand placement;  20.09 secs with rollator Goal status: Partially met 09-08-23  3.  Improve gait velocity to >/= 2.0 ft/sec with RW for increased gait efficiency. Baseline:  18.19 secs = 1.80 ft/sec;   09-08-23 -- 17.06 secs = 1.92 ft/sec  Goal status: Partially met 09-08-23  4.  Pt will participate in aquatic therapy session and determine if this would be a preferred ongoing exercise program upon D/C.  Baseline:  Goal status: GOAL MET 09-08-23  5.  Independent in HEP for land based balance exercises (and for aquatic exercises if pt requests to continue aquatic exercise upon D/C). Baseline:  Goal status: GOAL MET 09-08-23  New LONG TERM GOALS:   TARGET DATE 10-10-23  1.   Improve 5x sit to stand score to </= 11 secs without UE support from mat table. Baseline: 13.57 secs without UE support;   09-08-23;  15.47 secs, 12.28 secs on 2nd trial  Goal status: Partially met 09-08-23 (Goal ongoing)    2.  Improve Timed Up and Go score to </= 20 secs with use of RW to demo improved mobility and reduced fall risk. Baseline:  24.12 secs with RW - incorrect hand placement;  20.09 secs with rollator Goal status: Partially met 09-08-23  (Goal ongoing)     3.  Improve gait velocity to >/= 2.0 ft/sec with RW for increased gait efficiency. Baseline:  18.19 secs = 1.80 ft/sec;   09-08-23 -- 17.06 secs = 1.92 ft/sec  Goal status: Partially met 09-08-23       4.  Independent in updated HEP for land and aquatic based exercises.            Baseline:               Goal status:  NEW   ASSESSMENT:  CLINICAL IMPRESSION: PT session focused on static standing balance exercises to improve SLS on each LE and coordination exercises for bil. LE's.  Pt needs UE support for safety with standing balance exercises due to SLS deficits and ataxia in bil. LE's.  Pt continues to use rollator for assistance with ambulation due to balance deficits and postural instability.  Cont with POC.   OBJECTIVE IMPAIRMENTS: Abnormal gait, decreased balance, decreased coordination, and decreased safety awareness.   ACTIVITY LIMITATIONS: carrying, bending, squatting, and locomotion level  PARTICIPATION LIMITATIONS: shopping, community activity, and yard work  PERSONAL FACTORS: Time since onset of injury/illness/exacerbation and 1 comorbidity: cerebellar ataxia  are also affecting patient's functional outcome.   REHAB POTENTIAL: Fair due to progressive nature of disease  CLINICAL DECISION MAKING: Evolving/moderate complexity  EVALUATION COMPLEXITY: Moderate  PLAN:  PT FREQUENCY: 1-2x/week  PT DURATION: 4 weeks per renewal 09-09-23  PLANNED  INTERVENTIONS: 97110-Therapeutic exercises, 97530- Therapeutic activity, 97112- Neuromuscular re-education, [phone]- Self Care, [id number]- Gait training, and [phone]- Aquatic Therapy  PLAN FOR NEXT SESSION: Land - balance HEP                     Aquatic - balance and gait training   Kariah Loredo, Celeste Cola, PT 09/23/2023, 7:32 PM

## 2023-09-29 ENCOUNTER — Ambulatory Visit: Payer: Self-pay | Admitting: Physical Therapy

## 2023-09-29 DIAGNOSIS — R2689 Other abnormalities of gait and mobility: Secondary | ICD-10-CM | POA: Diagnosis not present

## 2023-09-29 DIAGNOSIS — R278 Other lack of coordination: Secondary | ICD-10-CM

## 2023-09-29 DIAGNOSIS — R2681 Unsteadiness on feet: Secondary | ICD-10-CM

## 2023-09-30 ENCOUNTER — Encounter: Payer: Self-pay | Admitting: Physical Therapy

## 2023-09-30 NOTE — Therapy (Signed)
 OUTPATIENT PHYSICAL THERAPY NEURO/AQUATIC THERAPY TREATMENT NOTE       Patient Name: Derrick White MRN: [id number] DOB:[birth year], 83 y.o., male 57 Date: 09/30/2023   PCP: Suzzanne Estrin., MD REFERRING PROVIDER: Jorie Newness, MD  END OF SESSION:  PT End of Session - 09/30/23 2019     Visit Number 12    Number of Visits 13   renewal completed for 4 additional visits   Date for PT Re-Evaluation 10/17/23    Authorization Type [employer]    Authorization Time Period 08-11-23 - 10-11-23; 09-08-23 - 10-18-23    Progress Note Due on Visit 10    PT Start Time 1400    PT Stop Time 1445    PT Time Calculation (min) 45 min    Equipment Utilized During Treatment Gait belt    Activity Tolerance Patient tolerated treatment well    Behavior During Therapy [facility] for tasks assessed/performed             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   Memory loss 03/16/2019   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   Pain due to onychomycosis of toenails of both feet 04/28/2019   Pain in left ankle and joints of left foot 08/06/2018   Slurred speech 03/16/2019   Superficial peroneal nerve neuropathy, 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   TIA (transient ischemic attack) 11/05/2019   Worsened handwriting 03/16/2019   Past Surgical History:  Procedure Laterality Date   APPENDECTOMY     1940s   BACK SURGERY     1981, 1998   CARDIOVERSION N/A 09/13/2020   Procedure: CARDIOVERSION;  Surgeon: Luana Rumple, MD;  Location: [facility] ENDOSCOPY;  Service: Cardiovascular;  Laterality: N/A;   FRACTURE SURGERY     Left ankle, sx x4   INGUINAL HERNIA REPAIR Right  03/05/2019   Procedure: OPEN RIGHT INGUINAL HERNIA REPAIR WITH MESH;  Surgeon: Adalberto Acton, MD;  Location: [facility] OR;  Service: General;  Laterality: Right;   KNEE SURGERY Left 1985   LEFT ATRIAL APPENDAGE OCCLUSION N/A 07/27/2020   Procedure: LEFT ATRIAL APPENDAGE OCCLUSION;  Surgeon: Arnoldo Lapping, MD;  Location: [facility] INVASIVE CV LAB;  Service: Cardiovascular;  Laterality: N/A;   SHOULDER SURGERY Left    TEE WITHOUT CARDIOVERSION N/A 07/27/2020   Procedure: TRANSESOPHAGEAL ECHOCARDIOGRAM (TEE);  Surgeon: Arnoldo Lapping, MD;  Location: [facility] INVASIVE CV LAB;  Service: Cardiovascular;  Laterality: N/A;   TEE WITHOUT CARDIOVERSION N/A 09/13/2020   Procedure: TRANSESOPHAGEAL ECHOCARDIOGRAM (TEE);  Surgeon: Luana Rumple, MD;  Location: [facility] ENDOSCOPY;  Service: Cardiovascular;  Laterality: N/A;   TONSILLECTOMY     1945   Patient Active Problem List   Diagnosis Date Noted   Pain in right shoulder 07/31/2021   Persistent atrial fibrillation (HCC) 08/28/2020   Secondary hypercoagulable state (HCC) 07/20/2020   Paroxysmal atrial fibrillation (HCC) 06/19/2020   TIA (transient ischemic attack) 11/05/2019   Essential hypertension 11/05/2019   Cerebellar ataxia (HCC) 11/05/2019   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   C6 radiculopathy 05/04/2019   Pain due to onychomycosis of toenails of both feet 04/28/2019   Worsened handwriting 03/16/2019   Gait disturbance 03/16/2019  Slurred speech 03/16/2019   Memory loss 03/16/2019   Pain in left ankle and joints of left foot 08/06/2018   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   Foot pain, left 06/04/2016   Dysesthesia 06/04/2016   Superficial peroneal nerve neuropathy, 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   History of cervical spinal surgery 06/04/2016    ONSET DATE: Referral date 06-26-23  REFERRING DIAG: Diagnosis G11.8 (ICD-10-CM) - Spinocerebellar ataxia (HCC) R26.9 (ICD-10-CM) - Gait disturbance  THERAPY DIAG:  Other abnormalities of gait and  mobility  Unsteadiness on feet  Other lack of coordination  Rationale for Evaluation and Treatment: Rehabilitation  SUBJECTIVE:                                                                                                                                                                                             SUBJECTIVE STATEMENT:  Pt reports he is still a little sore from the fall he had a couple of weeks ago but is doing OK  Per chart note:  "Update  06/26/2023" from Dr. Godwin Lat He feels his symptoms are mostly stable.  Balance is poor and he uses a rolling walker outside but walls/furniture or cane indoors.  He has no recent falls but has a lot of stumbles.   He has difficulty with slurred speech and swallowing.  Handwriting is poor abut able to feed himself. His family is still a walk in tub with grab bars.   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 NCV/EMG study did not show any evidence of motor neuron disease. We did the [employer] SCA panel (30+ genes). It was borderline. 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 No  PRECAUTIONS: Fall  RED FLAGS: None   WEIGHT BEARING RESTRICTIONS: No  FALLS: Has patient fallen in last 6 months? No  LIVING ENVIRONMENT: Lives with: lives with their spouse Lives in: House/apartment Stairs: Yes: Internal: 12 steps; can reach both and External: 2 steps; can reach both - in back; pt reports he does not access 2nd level of home - has bedroom on lower level Has following equipment at home: Otho Blitz - 4 wheeled, walking canes  PLOF: Independent with basic ADLs, Independent with household mobility with device, Independent with community mobility with device, and Needs assistance with homemaking  PATIENT GOALS: get around better and not fall - "stay out of a wheelchair"  OBJECTIVE:  Note: Objective measures were completed at Evaluation unless otherwise noted.  DIAGNOSTIC FINDINGS: N/A (none recently)  COGNITION: Overall cognitive status: Within functional limits for tasks assessed   SENSATION: WFL  COORDINATION: Decreased bil. LE's due to ataxia  POSTURE: rounded shoulders and forward head  LOWER EXTREMITY ROM:   WFL's bil. LE's   LOWER EXTREMITY MMT:    MMT Right Eval Left Eval  Hip flexion 5 5  Hip extension    Hip abduction    Hip adduction    Hip internal rotation    Hip external rotation    Knee flexion 5 5  Knee extension 5 5  Ankle dorsiflexion 5 5  Ankle plantarflexion    Ankle inversion    Ankle eversion    (Blank rows = not tested)  BED MOBILITY:  Modified independent  TRANSFERS: Assistive device utilized: Environmental consultant - 4 wheeled  Sit to stand: Modified independence Stand to sit: Modified independence  GAIT: Gait pattern: ataxic Distance walked: 100' Assistive device utilized: Environmental consultant - 4 wheeled Level of assistance: SBA Comments: needs cues for correct hand placement for safety with sit > stand transfers  FUNCTIONAL TESTS:  5 times sit to stand: 13.57 secs without  UE support from mat Timed up and go (TUG): 24.12 secs 10 meter walk test: 18.19 secs = 1.80 ft/sec with RW                                                                                                                              TREATMENT DATE: 09-29-23    Aquatic PT at [facility] - pool temp 90 degrees  Patient seen for aquatic therapy today.  Treatment took place in water 3.6 - 4.5 feet deep depending upon activity.  Pt entered the pool via step negotiation with use of hand rails with CGA for stability with descending steps to prevent slipping; SBA with use of hand rails with ascending steps for exiting pool - step by step sequence used with descension; step over step sequence used with ascension when exiting pool.  Pt performed water walking for warm up - forwards, backwards and sideways 18' x 4 reps each direction across width of pool - no floatation device used   Marching in place 10 reps each leg with UE support on pool edge for assist with balance; marching forwads  across pool 28' x 2 reps with UE support on barbells with SBA  Core stabilization exercises; pt stood statically with feet shoulder width apart -  -pt performed stepping forward back pushing barbells forward/back 10 reps - RLE and LLE       Performed stepping laterally (out/in) 10 reps each leg/moving barbells out to side in horizontal abdct/adduction 10 reps each leg        Balance exercises - pt performed alternating forward, side and backward kicks 10 reps each leg - holding onto pool edge for initial 5 reps, then less UE support as able for 2nd 5 reps for increased challenge with standing balance  Ai Chi posture - "Balancing" with 1 UE support on pool edge 10 reps each side  Pt requires buoyancy of water for support for reduced fall risk with balance exercises and activities as pt able to safely attempt and perform exercises in water compared to fall risk factor with performing same exercises on land.  Viscosity of  water is needed for resistance for strengthening and current of water provides perturbations for challenge for balance training.  Pt is able to safely perform ambulation without device in water which is unable to be attempted safely on land.     PATIENT EDUCATION: Education details: Eval results with recommended POC: discussed aquatic therapy and provided information/handout as pt is interested in this service Person educated: Patient Education method: Explanation and Handouts Education comprehension: verbalized understanding  HOME EXERCISE PROGRAM: To be established  GOALS: Goals reviewed with patient? Yes  SHORT TERM GOALS: SAME AS LTG'S   LONG TERM GOALS:    Target date: 09-12-23  Improve 5x sit to stand score to </= 11 secs without UE support from mat table. Baseline: 13.57 secs without UE support;   09-08-23;  15.47 secs, 12.28 secs on 2nd trial  Goal status: Partially met 09-08-23  2.  Improve Timed Up and Go score to </= 20 secs with use of RW to demo improved mobility and reduced fall risk. Baseline:  24.12 secs with RW - incorrect hand placement;  20.09 secs with rollator Goal status: Partially met 09-08-23  3.  Improve gait velocity to >/= 2.0 ft/sec with RW for increased gait efficiency. Baseline:  18.19 secs = 1.80 ft/sec;   09-08-23 -- 17.06 secs = 1.92 ft/sec  Goal status: Partially met 09-08-23  4.  Pt will participate in aquatic therapy session and determine if this would be a preferred ongoing exercise program upon D/C.  Baseline:  Goal status: GOAL MET 09-08-23  5.  Independent in HEP for land based balance exercises (and for aquatic exercises if pt requests to continue aquatic exercise upon D/C). Baseline:  Goal status: GOAL MET 09-08-23  New LONG TERM GOALS:  TARGET DATE 10-10-23                                    1.   Improve 5x sit to stand score to </= 11 secs without UE support from mat table. Baseline: 13.57 secs without UE support;   09-08-23;  15.47 secs,  12.28 secs on 2nd trial  Goal status: Partially met 09-08-23 (Goal ongoing)    2.  Improve Timed Up and Go score to </= 20 secs with use of RW to demo improved mobility and reduced fall risk. Baseline:  24.12 secs with RW - incorrect hand placement;  20.09 secs with rollator Goal status:  Partially met 09-08-23  (Goal ongoing)     3.  Improve gait velocity to >/= 2.0 ft/sec with RW for increased gait efficiency. Baseline:  18.19 secs = 1.80 ft/sec;   09-08-23 -- 17.06 secs = 1.92 ft/sec  Goal status: Partially met 09-08-23       4.  Independent in updated HEP for land and aquatic based exercises.            Baseline:               Goal status:  NEW    ASSESSMENT:  CLINICAL IMPRESSION:  Today's aquatic PT session focused on balance and coordination exercises and water walking in various directions without use of barbells for assist with balance.    Pt continues to need intermittent UE support with SLS activities.  Pt tolerated aquatic exercises well and demonstrates slight improvement in maintaining balance with aquatic exercises, especially with water walking.  Cont with POC.   OBJECTIVE IMPAIRMENTS: Abnormal gait, decreased balance, decreased coordination, and decreased safety awareness.   ACTIVITY LIMITATIONS: carrying, bending, squatting, and locomotion level  PARTICIPATION LIMITATIONS: shopping, community activity, and yard work  PERSONAL FACTORS: Time since onset of injury/illness/exacerbation and 1 comorbidity: cerebellar ataxia are also affecting patient's functional outcome.   REHAB POTENTIAL: Fair due to progressive nature of disease  CLINICAL DECISION MAKING: Evolving/moderate complexity  EVALUATION COMPLEXITY: Moderate  PLAN:  PT FREQUENCY: 2x/week  PT DURATION: 4 weeks + eval  PLANNED INTERVENTIONS: 97110-Therapeutic exercises, 97530- Therapeutic activity, [id number]- Neuromuscular re-education, [phone]- Self Care, [id number]- Gait training, and [phone]- Aquatic Therapy  PLAN FOR  NEXT SESSION:  continue balance/coordination exercises                    Aquatic - balance and gait training   Raechal Raben, Celeste Cola, PT 09/30/2023, 8:27 PM

## 2023-10-02 ENCOUNTER — Ambulatory Visit: Payer: Self-pay | Admitting: Physical Therapy

## 2023-10-02 ENCOUNTER — Encounter: Payer: Self-pay | Admitting: Physical Therapy

## 2023-10-02 DIAGNOSIS — M6281 Muscle weakness (generalized): Secondary | ICD-10-CM

## 2023-10-02 DIAGNOSIS — R278 Other lack of coordination: Secondary | ICD-10-CM

## 2023-10-02 DIAGNOSIS — R2689 Other abnormalities of gait and mobility: Secondary | ICD-10-CM | POA: Diagnosis not present

## 2023-10-02 DIAGNOSIS — R2681 Unsteadiness on feet: Secondary | ICD-10-CM

## 2023-10-02 NOTE — Therapy (Unsigned)
 OUTPATIENT PHYSICAL THERAPY NEURO TREATMENT NOTE/DISCHARGE SUMMARY   Patient Name: Derrick White MRN: [id number] DOB:[birth year], 83 y.o., male 47 Date: 10/03/2023   PCP: Suzzanne Estrin., MD REFERRING PROVIDER: Jorie Newness, MD  END OF SESSION:  PT End of Session - 10/03/23 1624     Visit Number 13    Number of Visits 13   renewal completed for 4 additional visits   Date for PT Re-Evaluation 10/17/23    Authorization Type [employer]    Authorization Time Period 08-11-23 - 10-11-23; 09-08-23 - 10-18-23    Progress Note Due on Visit 10    PT Start Time 1315    PT Stop Time 1401    PT Time Calculation (min) 46 min    Equipment Utilized During Treatment Gait belt    Activity Tolerance Patient tolerated treatment well    Behavior During Therapy [facility] for tasks assessed/performed                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   Memory loss 03/16/2019   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   Pain due to onychomycosis of toenails of both feet 04/28/2019   Pain in left ankle and joints of left foot 08/06/2018   Slurred speech 03/16/2019   Superficial peroneal nerve neuropathy, 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   TIA (transient ischemic attack) 11/05/2019   Worsened handwriting 03/16/2019   Past Surgical History:  Procedure Laterality Date   APPENDECTOMY     1940s   BACK SURGERY     1981, 1998   CARDIOVERSION N/A 09/13/2020   Procedure: CARDIOVERSION;  Surgeon: Luana Rumple, MD;  Location: [facility] ENDOSCOPY;  Service: Cardiovascular;  Laterality: N/A;   FRACTURE SURGERY     Left ankle, sx x4   INGUINAL HERNIA REPAIR Right  03/05/2019   Procedure: OPEN RIGHT INGUINAL HERNIA REPAIR WITH MESH;  Surgeon: Adalberto Acton, MD;  Location: [facility] OR;  Service: General;  Laterality: Right;   KNEE SURGERY Left 1985   LEFT ATRIAL APPENDAGE OCCLUSION N/A 07/27/2020   Procedure: LEFT ATRIAL APPENDAGE OCCLUSION;  Surgeon: Arnoldo Lapping, MD;  Location: [facility] INVASIVE CV LAB;  Service: Cardiovascular;  Laterality: N/A;   SHOULDER SURGERY Left    TEE WITHOUT CARDIOVERSION N/A 07/27/2020   Procedure: TRANSESOPHAGEAL ECHOCARDIOGRAM (TEE);  Surgeon: Arnoldo Lapping, MD;  Location: [facility] INVASIVE CV LAB;  Service: Cardiovascular;  Laterality: N/A;   TEE WITHOUT CARDIOVERSION N/A 09/13/2020   Procedure: TRANSESOPHAGEAL ECHOCARDIOGRAM (TEE);  Surgeon: Luana Rumple, MD;  Location: [facility] ENDOSCOPY;  Service: Cardiovascular;  Laterality: N/A;   TONSILLECTOMY     1945   Patient Active Problem List   Diagnosis Date Noted   Pain in right shoulder 07/31/2021   Persistent atrial fibrillation (HCC) 08/28/2020   Secondary hypercoagulable state (HCC) 07/20/2020   Paroxysmal atrial fibrillation (HCC) 06/19/2020   TIA (transient ischemic attack) 11/05/2019   Essential hypertension 11/05/2019   Cerebellar ataxia (HCC) 11/05/2019   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   C6 radiculopathy 05/04/2019   Pain due to onychomycosis of toenails of both feet 04/28/2019   Worsened handwriting 03/16/2019   Gait disturbance 03/16/2019  Slurred speech 03/16/2019   Memory loss 03/16/2019   Pain in left ankle and joints of left foot 08/06/2018   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   Foot pain, left 06/04/2016   Dysesthesia 06/04/2016   Superficial peroneal nerve neuropathy, 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   History of cervical spinal surgery 06/04/2016    ONSET DATE: Referral date 06-26-23  REFERRING DIAG: Diagnosis G11.8 (ICD-10-CM) - Spinocerebellar ataxia (HCC) R26.9 (ICD-10-CM) - Gait disturbance  THERAPY DIAG:  Other abnormalities of gait and  mobility  Unsteadiness on feet  Other lack of coordination  Muscle weakness (generalized)  Rationale for Evaluation and Treatment: Rehabilitation  SUBJECTIVE:                                                                                                                                                                                             SUBJECTIVE STATEMENT:  Pt reports he is doing fine - denies any acute changes.  Ready to finish up today as planned  Per chart note:  "Update  06/26/2023" from Dr. Godwin Lat He feels his symptoms are mostly stable.  Balance is poor and he uses a rolling walker outside but walls/furniture or cane indoors.  He has no recent falls but has a lot of stumbles.   He has difficulty with slurred speech and swallowing.  Handwriting is poor abut able to feed himself. His family is still a walk in tub with grab bars.   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 NCV/EMG study did not show any evidence of motor neuron disease. We did the [employer] SCA panel (30+ genes). It was borderline. 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 Unclear if this is playing a role (SCA type 6 has  mutations in this Ryann but usually triplet repeat)      PAIN:  Are you having pain? No  PRECAUTIONS: Fall  RED FLAGS: None   WEIGHT BEARING RESTRICTIONS:  No  FALLS: Has patient fallen in last 6 months? No  LIVING ENVIRONMENT: Lives with: lives with their spouse Lives in: House/apartment Stairs: Yes: Internal: 12 steps; can reach both and External: 2 steps; can reach both - in back; pt reports he does not access 2nd level of home - has bedroom on lower level Has following equipment at home: Otho Blitz - 4 wheeled, walking canes  PLOF: Independent with basic ADLs, Independent with household mobility with device, Independent with community mobility with device, and Needs assistance with homemaking  PATIENT GOALS: get around better and not fall - "stay out of a wheelchair"  OBJECTIVE:  Note: Objective measures were completed at Evaluation unless otherwise noted.  DIAGNOSTIC FINDINGS: N/A (none recently)  COGNITION: Overall cognitive status: Within functional limits for tasks assessed   SENSATION: WFL  COORDINATION: Decreased bil. LE's due to ataxia  POSTURE: rounded shoulders and forward head  LOWER EXTREMITY ROM:   WFL's bil. LE's   LOWER EXTREMITY MMT:    MMT Right Eval Left Eval  Hip flexion 5 5  Hip extension    Hip abduction    Hip adduction    Hip internal rotation    Hip external rotation    Knee flexion 5 5  Knee extension 5 5  Ankle dorsiflexion 5 5  Ankle plantarflexion    Ankle inversion    Ankle eversion    (Blank rows = not tested)  BED MOBILITY:  Modified independent  TRANSFERS: Assistive device utilized: Environmental consultant - 4 wheeled  Sit to stand: Modified independence Stand to sit: Modified independence  GAIT: Gait pattern: ataxic Distance walked: 100' Assistive device utilized: Environmental consultant - 4 wheeled Level of assistance: SBA Comments: needs cues for correct hand placement for safety with sit > stand transfers  FUNCTIONAL TESTS:  5 times sit to  stand: 13.57 secs without UE support from mat Timed up and go (TUG): 24.12 secs 10 meter walk test: 18.19 secs = 1.80 ft/sec with RW                                                                                                                              TREATMENT DATE: 10-02-23  TherEx: 5x sit to stand from mat table without UE support from mat table -- 14.85 secs from mat without UE support SciFit - level 4.0 x 3" with UE and LE's for improved coordination and strengthening  NeuroRe-ed:   TUG score = 19.69 secs with rollator  Rockerboard - performed inside // bars - with bil. UE support - 10 reps x 2 sets anterior/posteriorly  Balance exercises performed inside // bars - performed stepping over/back of black balance beam 10 reps each leg with UE support  Tap ups to 4" step placed inside // bars: straight ahead to step with UE support on hand rails 10 reps; then diagonal touches to the number "4" as the target for improved balance with LE crossing midline  Touching 2 cones -  tapping top of each cone straight ahead 5 reps each foot with UE support on // bars  Stepping over and back of black balance beam with UE support on // bars 10 reps each leg  Gait: Clinic distances with use of rollator with supervision Gait velocity = 15.19 secs with use of 4 wheeled RW = 2.16 ft/sec  TherAct; Practiced floor to stand transfer - red mat place on floor - pt performed sit to stand, turned 180 degrees to transition to tall kneeling then to sitting on floor; pt able to transfer from prone to quadruped to standing with UE support on mat - with CGA to SBA;  pt performed this transfer 2 reps    Pt performed balance exercises as issued for HEP - at counter with UE support for safety Access Code: [email or web address] URL: [email or web address] Date: 08/18/2023 Prepared by: Johnnette Nakayama  Exercises - Sit to Stand Without Arm Support  - 1 x daily - 7 x weekly - 1 sets - 10 reps - Standing Hip  Flexion with Counter Support  - 1 x daily - 7 x weekly - 1 sets - 10 reps - Standing Hip Extension with Counter Support  - 1 x daily - 7 x weekly - 1 sets - 10 reps - Standing Hip Abduction with Counter Support  - 1 x daily - 7 x weekly - 1 sets - 10 reps - Standing March with Counter Support  - 1 x daily - 7 x weekly - 1 sets - 10 reps - Seated Marching with Opposite Shoulder Flexion  - 1 x daily - 7 x weekly - 1 sets - 10 reps - Single Leg Stance with Support  - 1 x daily - 7 x weekly - 1 sets - 2 reps - 10 sec hold - Step Sideways  - 1 x daily - 7 x weekly - 1 sets - 10 reps  Balance exercises performed inside // bars - performed stepping over/back of black balance beam 10 reps each leg with UE support Tap ups to 6" step - straight ahead then diagonal taps with each foot 10 reps each with UE support Marching inside // bars with lifting contralateral UE for improved coordination Touching 2 colored discs as targets - straight ahead 5 reps each foot, then diagonal touches 5 reps each foot with UE support       PATIENT EDUCATION: Education details:  Balance HEP - Medbridge Person educated: Patient Education method: Explanation and Handouts Education comprehension: verbalized understanding  HOME EXERCISE PROGRAM: To be established  GOALS: Goals reviewed with patient? Yes  SHORT TERM GOALS: SAME AS LTG'S   LONG TERM GOALS: Target date: 09-12-23   Improve 5x sit to stand score to </= 11 secs without UE support from mat table. Baseline: 13.57 secs without UE support;   09-08-23;  15.47 secs, 12.28 secs on 2nd trial  Goal status: Partially met 09-08-23  2.  Improve Timed Up and Go score to </= 20 secs with use of RW to demo improved mobility and reduced fall risk. Baseline:  24.12 secs with RW - incorrect hand placement;  20.09 secs with rollator Goal status: Partially met 09-08-23  3.  Improve gait velocity to >/= 2.0 ft/sec with RW for increased gait efficiency. Baseline:  18.19  secs = 1.80 ft/sec;   09-08-23 -- 17.06 secs = 1.92 ft/sec  Goal status: Partially met 09-08-23  4.  Pt will participate in aquatic therapy session and determine if this would be a  preferred ongoing exercise program upon D/C.  Baseline:  Goal status: GOAL MET 09-08-23  5.  Independent in HEP for land based balance exercises (and for aquatic exercises if pt requests to continue aquatic exercise upon D/C). Baseline:  Goal status: GOAL MET 09-08-23  New LONG TERM GOALS:  TARGET DATE 10-10-23                                    1.   Improve 5x sit to stand score to </= 11 secs without UE support from mat table. Baseline: 13.57 secs without UE support;   09-08-23;  15.47 secs, 12.28 secs on 2nd trial;   14.85 secs from mat without UE support Goal status: Goal not met 10-02-23    2.  Improve Timed Up and Go score to </= 20 secs with use of RW to demo improved mobility and reduced fall risk. Baseline:  24.12 secs with RW - incorrect hand placement;  20.09 secs with rollator;  19.69 secs with rollator Goal status: Goal met 10-02-23     3.  Improve gait velocity to >/= 2.0 ft/sec with RW for increased gait efficiency. Baseline:  18.19 secs = 1.80 ft/sec;   09-08-23 -- 17.06 secs = 1.92 ft/sec ;  15.19 secs = 2.16 ft/sec with 4 wheeled RW Goal status: Goal met 10-02-23       4.  Independent in updated HEP for land and aquatic based exercises.            Baseline:               Goal status:  Goal met -- 10-02-23   ASSESSMENT:  CLINICAL IMPRESSION: PT session focused on assessment of LTG's for discharge.  Pt has met LTG's #2-4;  LTG #1 not met as 5x sit to stand score = 14.85 secs from mat without UE support.  Goal set for </= 11 secs, however, pt did maintain balance on all 5 reps of sit to stand transfers.  Pt is discharged due to majority of goals met and completion of program.  Pt has plateaued in maximizing functional progress at this time.  No further needs identified.  Pt agrees with D/C at this  time.   OBJECTIVE IMPAIRMENTS: Abnormal gait, decreased balance, decreased coordination, and decreased safety awareness.   ACTIVITY LIMITATIONS: carrying, bending, squatting, and locomotion level  PARTICIPATION LIMITATIONS: shopping, community activity, and yard work  PERSONAL FACTORS: Time since onset of injury/illness/exacerbation and 1 comorbidity: cerebellar ataxia are also affecting patient's functional outcome.   REHAB POTENTIAL: Fair due to progressive nature of disease  CLINICAL DECISION MAKING: Evolving/moderate complexity  EVALUATION COMPLEXITY: Moderate  PLAN:  PT FREQUENCY: 1-2x/week  PT DURATION: 4 weeks per renewal 09-09-23  PLANNED INTERVENTIONS: 97110-Therapeutic exercises, 97530- Therapeutic activity, 97112- Neuromuscular re-education, [phone]- Self Care, [id number]- Gait training, and [phone]- Aquatic Therapy  PLAN FOR NEXT SESSION: D/C on 10-02-23  PHYSICAL THERAPY DISCHARGE SUMMARY  Visits from Start of Care: 13  Current functional level related to goals / functional outcomes: See above for progress towards goals   Remaining deficits: Continued decreased static and dynamic standing balance, impaired coordination of bil. LE's and dependency with gait due to cerebellar ataxia.  Pt using 4 wheeled RW for assistance with ambulation but continues to have occasional falls.   Education / Equipment: Pt has been instructed in HEP for balance exercises; pt participated in aquatic PT during this episode  of care, but states he has no plans to continue with aquatic exercises upon D/C from PT   Patient agrees to discharge. Patient goals were partially met. Patient is being discharged due to meeting the stated rehab goals. Pt has plateaued in maximizing functional progress at this time.  Deficits persist due to progressive nature of disease process (cerebellar ataxia).                    Dorsie Gaunt, PT 10/03/2023, 4:28 PM

## 2023-10-03 ENCOUNTER — Encounter: Payer: Self-pay | Admitting: Physical Therapy

## 2023-10-06 ENCOUNTER — Ambulatory Visit: Payer: Self-pay | Admitting: Physical Therapy

## 2023-10-06 DIAGNOSIS — I4819 Other persistent atrial fibrillation: Secondary | ICD-10-CM | POA: Diagnosis not present

## 2023-10-06 DIAGNOSIS — R0781 Pleurodynia: Secondary | ICD-10-CM | POA: Diagnosis not present

## 2023-10-06 DIAGNOSIS — G3184 Mild cognitive impairment, so stated: Secondary | ICD-10-CM | POA: Diagnosis not present

## 2023-10-06 DIAGNOSIS — W19XXXA Unspecified fall, initial encounter: Secondary | ICD-10-CM | POA: Diagnosis not present

## 2023-10-06 DIAGNOSIS — G119 Hereditary ataxia, unspecified: Secondary | ICD-10-CM | POA: Diagnosis not present

## 2023-10-17 DIAGNOSIS — D6869 Other thrombophilia: Secondary | ICD-10-CM | POA: Diagnosis not present

## 2023-10-17 DIAGNOSIS — G5732 Lesion of lateral popliteal nerve, left lower limb: Secondary | ICD-10-CM | POA: Diagnosis not present

## 2023-10-17 DIAGNOSIS — M1990 Unspecified osteoarthritis, unspecified site: Secondary | ICD-10-CM | POA: Diagnosis not present

## 2023-10-17 DIAGNOSIS — E7800 Pure hypercholesterolemia, unspecified: Secondary | ICD-10-CM | POA: Diagnosis not present

## 2023-10-17 DIAGNOSIS — I4819 Other persistent atrial fibrillation: Secondary | ICD-10-CM | POA: Diagnosis not present

## 2023-10-17 DIAGNOSIS — R1312 Dysphagia, oropharyngeal phase: Secondary | ICD-10-CM | POA: Diagnosis not present

## 2023-10-17 DIAGNOSIS — K219 Gastro-esophageal reflux disease without esophagitis: Secondary | ICD-10-CM | POA: Diagnosis not present

## 2023-10-17 DIAGNOSIS — G3184 Mild cognitive impairment, so stated: Secondary | ICD-10-CM | POA: Diagnosis not present

## 2023-10-17 DIAGNOSIS — N401 Enlarged prostate with lower urinary tract symptoms: Secondary | ICD-10-CM | POA: Diagnosis not present

## 2023-10-17 DIAGNOSIS — G119 Hereditary ataxia, unspecified: Secondary | ICD-10-CM | POA: Diagnosis not present

## 2023-10-17 DIAGNOSIS — G4700 Insomnia, unspecified: Secondary | ICD-10-CM | POA: Diagnosis not present

## 2023-10-17 DIAGNOSIS — G9050 Complex regional pain syndrome I, unspecified: Secondary | ICD-10-CM | POA: Diagnosis not present

## 2023-10-20 DIAGNOSIS — Z961 Presence of intraocular lens: Secondary | ICD-10-CM | POA: Diagnosis not present

## 2023-10-20 DIAGNOSIS — H2511 Age-related nuclear cataract, right eye: Secondary | ICD-10-CM | POA: Diagnosis not present

## 2023-10-20 DIAGNOSIS — H25811 Combined forms of age-related cataract, right eye: Secondary | ICD-10-CM | POA: Diagnosis not present

## 2023-10-20 DIAGNOSIS — H25011 Cortical age-related cataract, right eye: Secondary | ICD-10-CM | POA: Diagnosis not present

## 2023-11-03 DIAGNOSIS — H25012 Cortical age-related cataract, left eye: Secondary | ICD-10-CM | POA: Diagnosis not present

## 2023-11-03 DIAGNOSIS — H25812 Combined forms of age-related cataract, left eye: Secondary | ICD-10-CM | POA: Diagnosis not present

## 2023-11-03 DIAGNOSIS — Z961 Presence of intraocular lens: Secondary | ICD-10-CM | POA: Diagnosis not present

## 2023-11-03 DIAGNOSIS — H2512 Age-related nuclear cataract, left eye: Secondary | ICD-10-CM | POA: Diagnosis not present

## 2023-12-09 ENCOUNTER — Telehealth: Payer: Self-pay | Admitting: Neurology

## 2023-12-09 DIAGNOSIS — R269 Unspecified abnormalities of gait and mobility: Secondary | ICD-10-CM

## 2023-12-09 DIAGNOSIS — R29898 Other symptoms and signs involving the musculoskeletal system: Secondary | ICD-10-CM

## 2023-12-09 NOTE — Telephone Encounter (Signed)
 Wife reports pt recently fell, this past Saturday, wife states EMS suggested she call and request PT for pt again, she would like to know if Dr Vear would consider PT for pt, please call.

## 2023-12-09 NOTE — Telephone Encounter (Addendum)
 Per Dr. Vear, ok to place referral for PT. Called and spoke with wife. States he is doing ok post fall, legs weaker.  They would like referral placed to [facility]. Aware I will place referral and they will call to get him scheduled. She verbalized understanding.  I placed referral.

## 2023-12-26 ENCOUNTER — Encounter: Payer: Self-pay | Admitting: Physical Therapy

## 2023-12-26 ENCOUNTER — Ambulatory Visit: Attending: Neurology | Admitting: Physical Therapy

## 2023-12-26 ENCOUNTER — Other Ambulatory Visit: Payer: Self-pay

## 2023-12-26 DIAGNOSIS — M6281 Muscle weakness (generalized): Secondary | ICD-10-CM | POA: Diagnosis not present

## 2023-12-26 DIAGNOSIS — R278 Other lack of coordination: Secondary | ICD-10-CM | POA: Diagnosis not present

## 2023-12-26 DIAGNOSIS — Z9181 History of falling: Secondary | ICD-10-CM | POA: Insufficient documentation

## 2023-12-26 DIAGNOSIS — R269 Unspecified abnormalities of gait and mobility: Secondary | ICD-10-CM | POA: Diagnosis not present

## 2023-12-26 DIAGNOSIS — R29898 Other symptoms and signs involving the musculoskeletal system: Secondary | ICD-10-CM | POA: Insufficient documentation

## 2023-12-26 DIAGNOSIS — R260 Ataxic gait: Secondary | ICD-10-CM | POA: Insufficient documentation

## 2023-12-26 DIAGNOSIS — R2681 Unsteadiness on feet: Secondary | ICD-10-CM | POA: Diagnosis not present

## 2023-12-26 DIAGNOSIS — R2689 Other abnormalities of gait and mobility: Secondary | ICD-10-CM | POA: Insufficient documentation

## 2023-12-26 NOTE — Therapy (Signed)
 OUTPATIENT PHYSICAL THERAPY NEURO EVALUATION   Patient Name: Derrick White MRN: [id number] DOB:20-Nov-1940, 83 y.o., male 49 Date: 12/26/2023   PCP: [name]., MD REFERRING PROVIDER: [name], MD  END OF SESSION:  PT End of Session - 12/26/23 1325     Visit Number 1    Number of Visits 9   8 + eval   Date for PT Re-Evaluation 02/06/24   pushed out due to delay in scheduling   Authorization Type [employer]    Progress Note Due on Visit 10    PT Start Time 1319    PT Stop Time 1400    PT Time Calculation (min) 41 min    Equipment Utilized During Treatment Gait belt    Activity Tolerance Patient tolerated treatment well    Behavior During Therapy WFL for tasks assessed/performed          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   Memory loss 03/16/2019   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   Pain due to onychomycosis of toenails of both feet 04/28/2019   Pain in left ankle and joints of left foot 08/06/2018   Slurred speech 03/16/2019   Superficial peroneal nerve neuropathy, 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   TIA (transient ischemic attack) 11/05/2019   Worsened handwriting 03/16/2019   Past Surgical History:  Procedure Laterality Date   APPENDECTOMY     1940s   BACK SURGERY     1981, 1998   CARDIOVERSION N/A 09/13/2020   Procedure: CARDIOVERSION;  Surgeon: [name], MD;  Location: [facility] ENDOSCOPY;  Service: Cardiovascular;  Laterality: N/A;   FRACTURE SURGERY     Left ankle, sx x4   INGUINAL HERNIA REPAIR Right 03/05/2019   Procedure: OPEN RIGHT INGUINAL HERNIA REPAIR WITH MESH;  Surgeon: [name], MD;   Location: [facility] OR;  Service: General;  Laterality: Right;   KNEE SURGERY Left 1985   LEFT ATRIAL APPENDAGE OCCLUSION N/A 07/27/2020   Procedure: LEFT ATRIAL APPENDAGE OCCLUSION;  Surgeon: [name], MD;  Location[facility] INVASIVE CV LAB;  Service: Cardiovascular;  Laterality: N/A;   SHOULDER SURGERY Left    TEE WITHOUT CARDIOVERSION N/A 07/27/2020   Procedure: TRANSESOPHAGEAL ECHOCARDIOGRAM (TEE);  Surgeon: [name], MD;  Location: [facility] INVASIVE CV LAB;  Service: Cardiovascular;  Laterality: N/A;   TEE WITHOUT CARDIOVERSION N/A 09/13/2020   Procedure: TRANSESOPHAGEAL ECHOCARDIOGRAM (TEE);  Surgeon: [name], MD;  Location: [facility] ENDOSCOPY;  Service: Cardiovascular;  Laterality: N/A;   TONSILLECTOMY     1945   Patient Active Problem List   Diagnosis Date Noted   Pain in right shoulder 07/31/2021   Persistent atrial fibrillation (HCC) 08/28/2020   Secondary hypercoagulable state (HCC) 07/20/2020   Paroxysmal atrial fibrillation (HCC) 06/19/2020   TIA (transient ischemic attack) 11/05/2019   Essential hypertension 11/05/2019   Cerebellar ataxia (HCC) 11/05/2019   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   C6 radiculopathy 05/04/2019   Pain due to onychomycosis of toenails of both feet 04/28/2019   Worsened handwriting 03/16/2019   Gait disturbance 03/16/2019   Slurred speech 03/16/2019   Memory loss 03/16/2019   Pain in left ankle  and joints of left foot 08/06/2018   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   Foot pain, left 06/04/2016   Dysesthesia 06/04/2016   Superficial peroneal nerve neuropathy, left 06/04/2016   History of cervical spinal surgery 06/04/2016    ONSET DATE: several years  REFERRING DIAG:  R26.9 (ICD-10-CM) - Gait disturbance  R29.898 (ICD-10-CM) - Weakness of both lower extremities    THERAPY DIAG:  Other abnormalities of gait and mobility  Unsteadiness on feet  Other lack of coordination  Muscle weakness (generalized)  Ataxic gait  History  of falling  Rationale for Evaluation and Treatment: Rehabilitation  SUBJECTIVE:                                                                                                                                                                                             SUBJECTIVE STATEMENT:  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  He was not using his AD during the fall because he was in tight quarters in the bathroom.  He endorses most of his falls occur when he is not using his rollator which he frequently does not do at home.    Pt accompanied by: self (he drove himself)  PERTINENT HISTORY: C6 radiculopathy, Cerebellar ataxia (HCC), Dysesthesia, Essential hypertension, Foot pain (left), Gait disturbance, History of cervical spinal surgery (06/04/2016), History of hiatal hernia, History of kidney stones, Lumbosacral radiculopathy at S1, Memory loss, Mild cognitive impairment, Pain due to onychomycosis of toenails of both feet, Pain in left ankle and joints of left foot, Slurred speech, Superficial peroneal nerve neuropathy, left (06/04/2009),TIA (transient ischemic attack) 11/05/2019. NCV/EMG study did not show any evidence of motor neuron disease. We did the [employer]na SCA panel (30+ genes). It was borderline. 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 Unclear if this is playing a role (SCA type 6 has mutations in this Jakeel but usually triplet repeat)     PAIN:  Are you having pain? No  PRECAUTIONS: Fall  RED FLAGS: None   WEIGHT BEARING RESTRICTIONS: No  FALLS: Has patient fallen in last 6 months? Yes. Number of falls 2 - both without  rollator/AD  LIVING ENVIRONMENT: Lives with: lives with their spouse Lives in: House/apartment Stairs: Yes: Internal:  12 steps; can reach both and External: 2 steps; can reach both - in back; pt reports he does not access 2nd level of home - has bedroom on lower level Has following equipment at home: [name] - 4 wheeled, walking canes  PLOF: Independent with basic ADLs, Independent with household mobility with device, Independent with community mobility with device, and Needs assistance with homemaking  PATIENT GOALS: to get better, but no specific goal  OBJECTIVE:  Note: Objective measures were completed at Evaluation unless otherwise noted.  DIAGNOSTIC FINDINGS: No recent relevant imaging.  COGNITION: Overall cognitive status: History of cognitive impairments - at baseline   SENSATION: WFL  COORDINATION: Ataxia of BLE  POSTURE: rounded shoulders and forward head  LOWER EXTREMITY ROM:   BLE WFL   LOWER EXTREMITY MMT (tested in seated):    MMT Right Eval Left Eval  Hip flexion 5 5  Hip extension    Hip abduction 4+ 4+  Hip adduction    Hip internal rotation    Hip external rotation    Knee flexion 5 5  Knee extension 5 5  Ankle dorsiflexion 5 5  Ankle plantarflexion    Ankle inversion    Ankle eversion    (Blank rows = not tested)  BED MOBILITY:  Modified independent - sleeps in standard bed w/o added features  TRANSFERS: Assistive device utilized: Environmental consultant - 4 wheeled  Sit to stand: SBA and cues for using brakes appropriate Stand to sit: SBA and cues for rollator brake use  GAIT: Gait pattern: ataxic Distance walked: various clinic distances ranging from 40-100' Assistive device utilized: Environmental consultant - 4 wheeled Level of assistance: CGA Comments: needs cues to maintain BUE on handles as pt distracted in open gym  FUNCTIONAL TESTS:  12/26/2023 EVAL: 5 times sit to stand: 19.34 sec no UE support w/ back of knees on chair CGA, declined edu to use UE for  balance and to come completely into upright Timed up and go (TUG): 27.25 sec, significant difficulty turning to sit requiring mod cues to improve approach 10 meter walk test: 14.78 sec w/ rollator CGA w/ increased trunk lean = 0.10m/sec = 2.23 ft/sec  FROM PRIOR EVAL: 5 times sit to stand: 13.57 secs without UE support from mat Timed up and go (TUG): 24.12 secs 10 meter walk test: 18.19 secs = 1.80 ft/sec with RW                                                                                                                              TREATMENT DATE: 12/26/23    PATIENT EDUCATION: Education details: PT POC, assessments used and comparison to prior, and goals to be set; removing throw rugs and obstacles, using night lights for increased contrast.  Not going without rollator (needs to use 2-handed AD AAT vs furniture walking).  Rollator brake use appropriately especially for STS.  Discussed aquatic modality and not putting pt back in this environment if he cannot  guarantee a caregiver will be with him since he is not open to using a 2WW or wheelchair/transport chair.  He cannot guarantee caregiver so will plan for land visits only.  Encouraged consideration of standard/RW over current rollator due to difficulty consistently using brakes and navigating appropriately - he is not completely closed off to this, but uncertain of openness - discouraged cane use at current based on ataxic presentation.  Emphasized PT can only do so much as he must maintain level of activity for strength and balance at home.  Discussed nature of primary Dx and fall risk and possible need for wheelchair evaluation in the future to optimize safety and independence - he is not open to this at current.  Discussed risk of injury and mortality with falls as he states as long as it is not my hip I can live with it.  Encouraged him to consider wheelchair for community mobility and home independence as he is at risk of increased falls  with age if presentation progresses.   Person educated: Patient Education method: Explanation, Demonstration, and Verbal cues Education comprehension: verbalized understanding and needs further education  HOME EXERCISE PROGRAM: Will review from prior.  GOALS: Goals reviewed with patient? Yes  SHORT TERM GOALS: SAME AS LTG'S   LONG TERM GOALS: Target date:  01/23/2024  Pt will be independent and compliant with strength and balance focused HEP in order to maintain functional progress and improve mobility. Baseline: Will review from prior. Goal status: INITIAL  2.  Pt will decrease 5xSTS to </=14.34 seconds using safest method to obtain upright in order to demonstrate decreased risk for falls and improved functional bilateral LE strength and power. Baseline:  19.34 sec no UE support w/ back of knees on chair CGA Goal status: INITIAL  3.  Pt will demonstrate a gait speed of >/=2.43 feet/sec in order to decrease risk for falls. Baseline:  2.23 ft/sec w/ rollator Goal status: INITIAL  4.  Pt will demonstrate TUG of </=22.25 seconds in order to decrease risk of falls and improve functional mobility using LRAD. Baseline: 27.25 sec w/ rollator Goal status: INITIAL  ASSESSMENT:  CLINICAL IMPRESSION: Patient is a 83 y.o. gentleman who was seen today for physical therapy evaluation and treatment for gait and balance impairment due to cerebellar ataxia.  He is well known to this clinic for history of falls and ataxia. He has a significant PMH including C6 radiculopathy, dysesthesia, HTN, superficial peroneal neuropathy, TIA, and mild cognitive impairment.  Pt is currently using rollator for ambulation, but would likely benefit from more supportive walker if not a wheelchair option for safest independence. His TUG score of 27.25 secs is longer than most recent prior episode of therapy ending in May 2025 indicating worsened fall risk.  His walking speed is improved today at 2.23 ft/sec but he has  increased forward trunk lean and improper approximation to rollator making turn to sit difficult.  His 5xSTS time of 19.34 seconds is worse than prior though he did not use his hands which due to instability in immediate unsupported stand this PT would recommend.  Pt will benefit from PT to address gait and balance impairments and improve towards goals as written.    OBJECTIVE IMPAIRMENTS: Abnormal gait, decreased balance, decreased coordination, decreased knowledge of condition, decreased knowledge of use of DME, decreased strength, decreased safety awareness, and improper body mechanics.   ACTIVITY LIMITATIONS: carrying, lifting, bending, standing, squatting, transfers, reach over head, and locomotion level  PARTICIPATION LIMITATIONS: cleaning, laundry,  shopping, community activity, and yard work  PERSONAL FACTORS: Age, Fitness, Past/current experiences, Time since onset of injury/illness/exacerbation, and 1-2 comorbidities: Cerebellar ataxia, mild cognitive impairment are also affecting patient's functional outcome.   REHAB POTENTIAL: Fair due to progressive nature of disease  CLINICAL DECISION MAKING: Evolving/moderate complexity  EVALUATION COMPLEXITY: Moderate  PLAN:  PT FREQUENCY: 2x/week  PT DURATION: 4 weeks + eval  PLANNED INTERVENTIONS: 97164- PT Re-evaluation, 97750- Physical Performance Testing, 97110-Therapeutic exercises, 97530- Therapeutic activity, [id number]- Neuromuscular re-education, [phone]- Self Care, [id number]- Gait training, [phone]- Aquatic Therapy, Patient/Family education, Balance training, Stair training, Vestibular training, and DME instructions  PLAN FOR NEXT SESSION: Review prior land HEP and modify as needed.  Work w/ 2WW vs rollator indoor and outdoors - tight Psychiatrist.  Endurance - [employer].  BLE strengthening.  Safety awareness - dual tasking?   Daved KATHEE Bull, PT, DPT 12/26/2023, 2:00 PM

## 2023-12-29 ENCOUNTER — Encounter: Payer: Self-pay | Admitting: Physical Therapy

## 2023-12-29 ENCOUNTER — Ambulatory Visit: Admitting: Physical Therapy

## 2023-12-29 DIAGNOSIS — M6281 Muscle weakness (generalized): Secondary | ICD-10-CM

## 2023-12-29 DIAGNOSIS — R2689 Other abnormalities of gait and mobility: Secondary | ICD-10-CM

## 2023-12-29 DIAGNOSIS — R2681 Unsteadiness on feet: Secondary | ICD-10-CM

## 2023-12-29 DIAGNOSIS — Z9181 History of falling: Secondary | ICD-10-CM

## 2023-12-29 DIAGNOSIS — R278 Other lack of coordination: Secondary | ICD-10-CM

## 2023-12-29 NOTE — Therapy (Signed)
 OUTPATIENT PHYSICAL THERAPY NEURO TREATMENT   Patient Name: Derrick White MRN: [id number] DOB:[birth year], 83 y.o., male 61 Date: 12/29/2023   PCP: [name]., MD REFERRING PROVIDER: [name], MD  END OF SESSION:  PT End of Session - 12/29/23 1115     Visit Number 2    Number of Visits 9   8 + eval   Date for PT Re-Evaluation 02/06/24   pushed out due to delay in scheduling   Authorization Type [employer]    Progress Note Due on Visit 10    PT Start Time 1110    PT Stop Time 1154    PT Time Calculation (min) 44 min    Equipment Utilized During Treatment Gait belt    Activity Tolerance Patient tolerated treatment well    Behavior During Therapy [facility] for tasks assessed/performed          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   Memory loss 03/16/2019   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   Pain due to onychomycosis of toenails of both feet 04/28/2019   Pain in left ankle and joints of left foot 08/06/2018   Slurred speech 03/16/2019   Superficial peroneal nerve neuropathy, 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   TIA (transient ischemic attack) 11/05/2019   Worsened handwriting 03/16/2019   Past Surgical History:  Procedure Laterality Date   APPENDECTOMY     1940s   BACK SURGERY     1981, 1998   CARDIOVERSION N/A 09/13/2020   Procedure: CARDIOVERSION;  Surgeon: [name], MD;  Location: [facility] ENDOSCOPY;  Service: Cardiovascular;  Laterality: N/A;   FRACTURE SURGERY     Left ankle, sx x4   INGUINAL HERNIA REPAIR Right 03/05/2019   Procedure: OPEN RIGHT INGUINAL HERNIA REPAIR WITH MESH;  Surgeon: [name], MD;   Location: [facility] OR;  Service: General;  Laterality: Right;   KNEE SURGERY Left 1985   LEFT ATRIAL APPENDAGE OCCLUSION N/A 07/27/2020   Procedure: LEFT ATRIAL APPENDAGE OCCLUSION;  Surgeon: [name], MD;  Location: [facility] INVASIVE CV LAB;  Service: Cardiovascular;  Laterality: N/A;   SHOULDER SURGERY Left    TEE WITHOUT CARDIOVERSION N/A 07/27/2020   Procedure: TRANSESOPHAGEAL ECHOCARDIOGRAM (TEE);  Surgeon: [name], MD;  Location: [facility] INVASIVE CV LAB;  Service: Cardiovascular;  Laterality: N/A;   TEE WITHOUT CARDIOVERSION N/A 09/13/2020   Procedure: TRANSESOPHAGEAL ECHOCARDIOGRAM (TEE);  Surgeon: [name], MD;  Location: [facility] ENDOSCOPY;  Service: Cardiovascular;  Laterality: N/A;   TONSILLECTOMY     1945   Patient Active Problem List   Diagnosis Date Noted   Pain in right shoulder 07/31/2021   Persistent atrial fibrillation (HCC) 08/28/2020   Secondary hypercoagulable state (HCC) 07/20/2020   Paroxysmal atrial fibrillation (HCC) 06/19/2020   TIA (transient ischemic attack) 11/05/2019   Essential hypertension 11/05/2019   Cerebellar ataxia (HCC) 11/05/2019   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   C6 radiculopathy 05/04/2019   Pain due to onychomycosis of toenails of both feet 04/28/2019   Worsened handwriting 03/16/2019   Gait disturbance 03/16/2019   Slurred speech 03/16/2019   Memory loss 03/16/2019   Pain in left ankle  and joints of left foot 08/06/2018   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   Foot pain, left 06/04/2016   Dysesthesia 06/04/2016   Superficial peroneal nerve neuropathy, 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   History of cervical spinal surgery 06/04/2016    ONSET DATE: several years  REFERRING DIAG:  R26.9 (ICD-10-CM) - Gait disturbance  R29.898 (ICD-10-CM) - Weakness of both lower extremities    THERAPY DIAG:  Other abnormalities of gait and mobility  Unsteadiness on feet  Other lack of coordination  Muscle weakness (generalized)  History of  falling  Rationale for Evaluation and Treatment: Rehabilitation  SUBJECTIVE:                                                                                                                                                                                             SUBJECTIVE STATEMENT:  Pt denies falls or pain.  He presents with rollator.   Pt accompanied by: self (he drove himself)  PERTINENT HISTORY: C6 radiculopathy, Cerebellar ataxia (HCC), Dysesthesia, Essential hypertension, Foot pain (left), Gait disturbance, History of cervical spinal surgery (06/04/2016), History of hiatal hernia, History of kidney stones, Lumbosacral radiculopathy at S1, Memory loss, Mild cognitive impairment, Pain due to onychomycosis of toenails of both feet, Pain in left ankle and joints of left foot, Slurred speech, Superficial peroneal nerve neuropathy, left (06/04/2009),TIA (transient ischemic attack) 11/05/2019. NCV/EMG study did not show any evidence of motor neuron disease. We did the Athena SCA panel (30+ genes). It was borderline. 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 No  PRECAUTIONS: Fall  RED FLAGS: None   WEIGHT BEARING RESTRICTIONS: No  FALLS: Has patient fallen in last 6 months? Yes. Number of falls 2 - both without rollator/AD  LIVING ENVIRONMENT: Lives with: lives with their spouse Lives in: House/apartment Stairs: Yes: Internal: 12 steps; can reach both and External: 2 steps; can reach both - in back; pt reports he does not access 2nd level of home - has bedroom on lower level Has following equipment at home: [name] - 4 wheeled, walking canes  PLOF: Independent with basic ADLs, Independent with household mobility with device, Independent with community mobility with device, and Needs assistance with  homemaking  PATIENT GOALS: to get better, but no specific goal  OBJECTIVE:  Note: Objective measures were completed at Evaluation unless otherwise noted.  DIAGNOSTIC FINDINGS: No recent relevant imaging.  COGNITION: Overall cognitive status: History of cognitive impairments - at  baseline   SENSATION: WFL  COORDINATION: Ataxia of BLE  POSTURE: rounded shoulders and forward head  LOWER EXTREMITY ROM:   BLE WFL   LOWER EXTREMITY MMT (tested in seated):    MMT Right Eval Left Eval  Hip flexion 5 5  Hip extension    Hip abduction 4+ 4+  Hip adduction    Hip internal rotation    Hip external rotation    Knee flexion 5 5  Knee extension 5 5  Ankle dorsiflexion 5 5  Ankle plantarflexion    Ankle inversion    Ankle eversion    (Blank rows = not tested)  BED MOBILITY:  Modified independent - sleeps in standard bed w/o added features  TRANSFERS: Assistive device utilized: Environmental consultant - 4 wheeled  Sit to stand: SBA and cues for using brakes appropriate Stand to sit: SBA and cues for rollator brake use  GAIT: Gait pattern: ataxic Distance walked: various clinic distances ranging from 40-100' Assistive device utilized: Environmental consultant - 4 wheeled Level of assistance: CGA Comments: needs cues to maintain BUE on handles as pt distracted in open gym  FUNCTIONAL TESTS:  12/26/2023 EVAL: 5 times sit to stand: 19.34 sec no UE support w/ back of knees on chair CGA, declined edu to use UE for balance and to come completely into upright Timed up and go (TUG): 27.25 sec, significant difficulty turning to sit requiring mod cues to improve approach 10 meter walk test: 14.78 sec w/ rollator CGA w/ increased trunk lean = 0.20m/sec = 2.23 ft/sec  FROM PRIOR EVAL: 5 times sit to stand: 13.57 secs without UE support from mat Timed up and go (TUG): 24.12 secs 10 meter walk test: 18.19 secs = 1.80 ft/sec with RW                                                                                                                               TREATMENT DATE: 12/29/23  -SciFit in multi-peaks mode x8 minutes using BUE/BLE for cardiac endurance and dynamic neural priming.    -Practiced parking and turning rollator into tight cone outlined spaces several reps CGA w/ cues for navigation and AD approximation  In // bars:  -8 alternating midline cone taps x20  -8 alternating R/L forward cone taps x20 each LE  -8 lateral 8 cone taps x20 each LE, increased forward lean  -Seated hip flexion march overs w/ 6 target, cues to prevent circumduction 2x10 each LE  At countertop: -Lateral stepping 6x10 ft, cues to maintain hip abductor engagement -Standing march x20 > contralateral UE lift x20 min cues for coordination -Lateral weight shift w/ lean in wide stance x10 each direction CGA, min cues for ROM  -STS using 2-hand staggered position for improved safety and stability w/ AD 2x10 - pt fatigues w/ repetition requiring minA and cues for safe immediate standing.  He prefers attempts without UE support, but uses excessive anterior ankle strategy and posterior surface support on  knees so educated against this.  PATIENT EDUCATION: Education details: Will review HEP next visit - can continue until then.  Continue going to senior center - he goes once a week.  Safety with rollator brakes, tight spaces, and STS. Person educated: Patient Education method: Explanation, Demonstration, and Verbal cues Education comprehension: verbalized understanding and needs further education  HOME EXERCISE PROGRAM: Will review from prior.  GOALS: Goals reviewed with patient? Yes  SHORT TERM GOALS: SAME AS LTG'S   LONG TERM GOALS: Target date:  01/23/2024  Pt will be independent and compliant with strength and balance focused HEP in order to maintain functional progress and improve mobility. Baseline: Will review from prior. Goal status: INITIAL  2.  Pt will decrease 5xSTS to </=14.34 seconds using safest  method to obtain upright in order to demonstrate decreased risk for falls and improved functional bilateral LE strength and power. Baseline:  19.34 sec no UE support w/ back of knees on chair CGA Goal status: INITIAL  3.  Pt will demonstrate a gait speed of >/=2.43 feet/sec in order to decrease risk for falls. Baseline:  2.23 ft/sec w/ rollator Goal status: INITIAL  4.  Pt will demonstrate TUG of </=22.25 seconds in order to decrease risk of falls and improve functional mobility using LRAD. Baseline: 27.25 sec w/ rollator Goal status: INITIAL  ASSESSMENT:  CLINICAL IMPRESSION: Patient requires ongoing safety education with rollator navigation.  He has little carryover of safety education provided last session still preferring to transfer without UE use despite functional weakness and imbalance on immediate stand.  Focused on rollator navigation and BLE coordination tasks for majority of visit.  He is challenged by increased hip flexion demand sometimes being unable to back down from obstacle in SLS.  He requires ongoing strength and balance work to minimize fall risk with PT to review and further tailor HEP next visit.  Continue per POC.  OBJECTIVE IMPAIRMENTS: Abnormal gait, decreased balance, decreased coordination, decreased knowledge of condition, decreased knowledge of use of DME, decreased strength, decreased safety awareness, and improper body mechanics.   ACTIVITY LIMITATIONS: carrying, lifting, bending, standing, squatting, transfers, reach over head, and locomotion level  PARTICIPATION LIMITATIONS: cleaning, laundry, shopping, community activity, and yard work  PERSONAL FACTORS: Age, Fitness, Past/current experiences, Time since onset of injury/illness/exacerbation, and 1-2 comorbidities: Cerebellar ataxia, mild cognitive impairment are also affecting patient's functional outcome.   REHAB POTENTIAL: Fair due to progressive nature of disease  CLINICAL DECISION MAKING:  Evolving/moderate complexity  EVALUATION COMPLEXITY: Moderate  PLAN:  PT FREQUENCY: 2x/week  PT DURATION: 4 weeks + eval  PLANNED INTERVENTIONS: [id number]- PT Re-evaluation, 97750- Physical Performance Testing, 97110-Therapeutic exercises, 97530- Therapeutic activity, [id number]- Neuromuscular re-education, [phone]- Self Care, [id number]- Gait training, [phone]- Aquatic Therapy, Patient/Family education, Balance training, Stair training, Vestibular training, and DME instructions  PLAN FOR NEXT SESSION: Review prior land HEP and modify as needed.  Work w/ 2WW vs rollator indoor and outdoors - tight Psychiatrist.  Endurance - SciFit.  BLE strengthening.  Safety awareness - dual tasking!  Static balance, perturbations?  Step ups   Daved KATHEE Bull, PT, DPT 12/29/2023, 1:38 PM

## 2023-12-31 DIAGNOSIS — H0100A Unspecified blepharitis right eye, upper and lower eyelids: Secondary | ICD-10-CM | POA: Diagnosis not present

## 2023-12-31 DIAGNOSIS — H04123 Dry eye syndrome of bilateral lacrimal glands: Secondary | ICD-10-CM | POA: Diagnosis not present

## 2023-12-31 DIAGNOSIS — H01004 Unspecified blepharitis left upper eyelid: Secondary | ICD-10-CM | POA: Diagnosis not present

## 2023-12-31 DIAGNOSIS — H02105 Unspecified ectropion of left lower eyelid: Secondary | ICD-10-CM | POA: Diagnosis not present

## 2024-01-02 ENCOUNTER — Ambulatory Visit: Admitting: Physical Therapy

## 2024-01-02 ENCOUNTER — Encounter: Payer: Self-pay | Admitting: Physical Therapy

## 2024-01-02 DIAGNOSIS — R278 Other lack of coordination: Secondary | ICD-10-CM

## 2024-01-02 DIAGNOSIS — R2689 Other abnormalities of gait and mobility: Secondary | ICD-10-CM | POA: Diagnosis not present

## 2024-01-02 DIAGNOSIS — R260 Ataxic gait: Secondary | ICD-10-CM

## 2024-01-02 DIAGNOSIS — Z9181 History of falling: Secondary | ICD-10-CM

## 2024-01-02 DIAGNOSIS — R2681 Unsteadiness on feet: Secondary | ICD-10-CM

## 2024-01-02 DIAGNOSIS — M6281 Muscle weakness (generalized): Secondary | ICD-10-CM

## 2024-01-02 NOTE — Therapy (Signed)
 OUTPATIENT PHYSICAL THERAPY NEURO TREATMENT   Patient Name: DIANA ARMIJO MRN: [id number] DOB:1941/01/21, 83 y.o., male 64 Date: 01/02/2024   PCP: [name]., MD REFERRING PROVIDER: [name], MD  END OF SESSION:  PT End of Session - 01/02/24 1400     Visit Number 3    Number of Visits 9   8 + eval   Date for PT Re-Evaluation 02/06/24   pushed out due to delay in scheduling   Authorization Type [employer]    Progress Note Due on Visit 10    PT Start Time 1400    PT Stop Time 1441    PT Time Calculation (min) 41 min    Equipment Utilized During Treatment Gait belt    Activity Tolerance Patient tolerated treatment well    Behavior During Therapy [facility] for tasks assessed/performed          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   Memory loss 03/16/2019   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   Pain due to onychomycosis of toenails of both feet 04/28/2019   Pain in left ankle and joints of left foot 08/06/2018   Slurred speech 03/16/2019   Superficial peroneal nerve neuropathy, 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   TIA (transient ischemic attack) 11/05/2019   Worsened handwriting 03/16/2019   Past Surgical History:  Procedure Laterality Date   APPENDECTOMY     1940s   BACK SURGERY     1981, 1998   CARDIOVERSION N/A 09/13/2020   Procedure: CARDIOVERSION;  Surgeon: [name], MD;  Location: [facility] ENDOSCOPY;  Service: Cardiovascular;  Laterality: N/A;   FRACTURE SURGERY     Left ankle, sx x4   INGUINAL HERNIA REPAIR Right 03/05/2019   Procedure: OPEN RIGHT INGUINAL HERNIA REPAIR WITH MESH;  Surgeon: [name], MD;   Location: [facility] OR;  Service: General;  Laterality: Right;   KNEE SURGERY Left 1985   LEFT ATRIAL APPENDAGE OCCLUSION N/A 07/27/2020   Procedure: LEFT ATRIAL APPENDAGE OCCLUSION;  Surgeon: [name], MD;  Location: [facility] INVASIVE CV LAB;  Service: Cardiovascular;  Laterality: N/A;   SHOULDER SURGERY Left    TEE WITHOUT CARDIOVERSION N/A 07/27/2020   Procedure: TRANSESOPHAGEAL ECHOCARDIOGRAM (TEE);  Surgeon: [name], MD;  Location: [facility] INVASIVE CV LAB;  Service: Cardiovascular;  Laterality: N/A;   TEE WITHOUT CARDIOVERSION N/A 09/13/2020   Procedure: TRANSESOPHAGEAL ECHOCARDIOGRAM (TEE);  Surgeon: [name], MD;  Location: [facility] ENDOSCOPY;  Service: Cardiovascular;  Laterality: N/A;   TONSILLECTOMY     1945   Patient Active Problem List   Diagnosis Date Noted   Pain in right shoulder 07/31/2021   Persistent atrial fibrillation (HCC) 08/28/2020   Secondary hypercoagulable state (HCC) 07/20/2020   Paroxysmal atrial fibrillation (HCC) 06/19/2020   TIA (transient ischemic attack) 11/05/2019   Essential hypertension 11/05/2019   Cerebellar ataxia (HCC) 11/05/2019   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   C6 radiculopathy 05/04/2019   Pain due to onychomycosis of toenails of both feet 04/28/2019   Worsened handwriting 03/16/2019   Gait disturbance 03/16/2019   Slurred speech 03/16/2019   Memory loss 03/16/2019   Pain in left ankle  and joints of left foot 08/06/2018   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   Foot pain, left 06/04/2016   Dysesthesia 06/04/2016   Superficial peroneal nerve neuropathy, 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   History of cervical spinal surgery 06/04/2016    ONSET DATE: several years  REFERRING DIAG:  R26.9 (ICD-10-CM) - Gait disturbance  R29.898 (ICD-10-CM) - Weakness of both lower extremities    THERAPY DIAG:  Other abnormalities of gait and mobility  Unsteadiness on feet  Other lack of coordination  Muscle weakness (generalized)  History of  falling  Ataxic gait  Rationale for Evaluation and Treatment: Rehabilitation  SUBJECTIVE:                                                                                                                                                                                             SUBJECTIVE STATEMENT:  Pt denies falls or pain.  He presents with rollator.  He states he has not done much this week.  Pt accompanied by: self (he drove himself)  PERTINENT HISTORY: C6 radiculopathy, Cerebellar ataxia (HCC), Dysesthesia, Essential hypertension, Foot pain (left), Gait disturbance, History of cervical spinal surgery (06/04/2016), History of hiatal hernia, History of kidney stones, Lumbosacral radiculopathy at S1, Memory loss, Mild cognitive impairment, Pain due to onychomycosis of toenails of both feet, Pain in left ankle and joints of left foot, Slurred speech, Superficial peroneal nerve neuropathy, left (06/04/2009),TIA (transient ischemic attack) 11/05/2019. NCV/EMG study did not show any evidence of motor neuron disease. We did the [employer] SCA panel (30+ genes). It was borderline. 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 Unclear if this is playing a role (SCA type 6 has mutations in this [place] but usually triplet repeat)     PAIN:  Are you having pain? No  PRECAUTIONS: Fall  RED FLAGS: None   WEIGHT BEARING RESTRICTIONS: No  FALLS: Has patient fallen in last 6 months? Yes. Number of falls 2 - both without rollator/AD  LIVING ENVIRONMENT: Lives with: lives with their spouse Lives in: House/apartment Stairs: Yes: Internal: 12 steps; can reach both and External: 2 steps; can reach both - in back; pt reports he does not access 2nd level of home - has bedroom on lower level Has following equipment at home: [name] - 4 wheeled, walking canes  PLOF: Independent with basic ADLs, Independent with household mobility with device, Independent with community  mobility with device, and Needs assistance with homemaking  PATIENT GOALS: to get better, but no specific goal  OBJECTIVE:  Note: Objective measures were completed at Evaluation unless otherwise noted.  DIAGNOSTIC FINDINGS: No recent relevant  imaging.  COGNITION: Overall cognitive status: History of cognitive impairments - at baseline   SENSATION: WFL  COORDINATION: Ataxia of BLE  POSTURE: rounded shoulders and forward head  LOWER EXTREMITY ROM:   BLE WFL   LOWER EXTREMITY MMT (tested in seated):    MMT Right Eval Left Eval  Hip flexion 5 5  Hip extension    Hip abduction 4+ 4+  Hip adduction    Hip internal rotation    Hip external rotation    Knee flexion 5 5  Knee extension 5 5  Ankle dorsiflexion 5 5  Ankle plantarflexion    Ankle inversion    Ankle eversion    (Blank rows = not tested)  BED MOBILITY:  Modified independent - sleeps in standard bed w/o added features  TRANSFERS: Assistive device utilized: Environmental consultant - 4 wheeled  Sit to stand: SBA and cues for using brakes appropriate Stand to sit: SBA and cues for rollator brake use  GAIT: Gait pattern: ataxic Distance walked: various clinic distances ranging from 40-100' Assistive device utilized: Environmental consultant - 4 wheeled Level of assistance: CGA Comments: needs cues to maintain BUE on handles as pt distracted in open gym  FUNCTIONAL TESTS:  12/26/2023 EVAL: 5 times sit to stand: 19.34 sec no UE support w/ back of knees on chair CGA, declined edu to use UE for balance and to come completely into upright Timed up and go (TUG): 27.25 sec, significant difficulty turning to sit requiring mod cues to improve approach 10 meter walk test: 14.78 sec w/ rollator CGA w/ increased trunk lean = 0.40m/sec = 2.23 ft/sec  FROM PRIOR EVAL: 5 times sit to stand: 13.57 secs without UE support from mat Timed up and go (TUG): 24.12 secs 10 meter walk test: 18.19 secs = 1.80 ft/sec with RW                                                                                                                               TREATMENT DATE: 01/02/24  -SciFit in multi-peaks mode x8 minutes up to level 6.0 using BUE/BLE for cardiac endurance, BLE strengthening, and dynamic neural priming.    Reviewed and maintained HEP (2 sets of each): Access Code: [email or web address] URL: [email or web address] Date: 01/02/2024 Prepared by: Daved Bull  Exercises - Sit to Stand Without Arm Support  - 1 x daily - 7 x weekly - 1 sets - 10 reps - Standing Hip Flexion with Counter Support  - 1 x daily - 7 x weekly - 1 sets - 10 reps - Standing Hip Extension with Counter Support  - 1 x daily - 7 x weekly - 1 sets - 10 reps - Standing Hip Abduction with Counter Support  - 1 x daily - 7 x weekly - 1 sets - 10 reps - Standing March with Counter Support  - 1 x daily - 7 x weekly - 1 sets - 10 reps - Seated Marching with Opposite Shoulder Flexion  -  1 x daily - 7 x weekly - 1 sets - 10 reps - Single Leg Stance with Support  - 1 x daily - 7 x weekly - 1 sets - 2 reps - 10 sec hold - Step Sideways  - 1 x daily - 7 x weekly - 1 sets - 10 reps  Lateral stepouts w/ squigz placement high and low w/ ipsilateral vs contralateral reach > repeated for retrieval CGA w/ unilateral UE support  PATIENT EDUCATION: Education details: Continue HEP. Continue going to senior center - he goes once a week.  Safety with rollator brakes, tight spaces, and STS. Person educated: Patient Education method: Explanation, Demonstration, and Verbal cues Education comprehension: verbalized understanding and needs further education  HOME EXERCISE PROGRAM: Access Code: [email or web address] URL: [email or web address] Date: 01/02/2024 Prepared by: Daved Bull  Exercises - Sit to Stand Without Arm Support  - 1 x daily - 7 x weekly - 1 sets - 10 reps - Standing Hip Flexion with Counter Support  - 1 x daily - 7 x weekly - 1 sets - 10 reps - Standing Hip Extension  with Counter Support  - 1 x daily - 7 x weekly - 1 sets - 10 reps - Standing Hip Abduction with Counter Support  - 1 x daily - 7 x weekly - 1 sets - 10 reps - Standing March with Counter Support  - 1 x daily - 7 x weekly - 1 sets - 10 reps - Seated Marching with Opposite Shoulder Flexion  - 1 x daily - 7 x weekly - 1 sets - 10 reps - Single Leg Stance with Support  - 1 x daily - 7 x weekly - 1 sets - 2 reps - 10 sec hold - Step Sideways  - 1 x daily - 7 x weekly - 1 sets - 10 reps  GOALS: Goals reviewed with patient? Yes  SHORT TERM GOALS: SAME AS LTG'S   LONG TERM GOALS: Target date:  01/23/2024  Pt will be independent and compliant with strength and balance focused HEP in order to maintain functional progress and improve mobility. Baseline: Will review from prior. Goal status: INITIAL  2.  Pt will decrease 5xSTS to </=14.34 seconds using safest method to obtain upright in order to demonstrate decreased risk for falls and improved functional bilateral LE strength and power. Baseline:  19.34 sec no UE support w/ back of knees on chair CGA Goal status: INITIAL  3.  Pt will demonstrate a gait speed of >/=2.43 feet/sec in order to decrease risk for falls. Baseline:  2.23 ft/sec w/ rollator Goal status: INITIAL  4.  Pt will demonstrate TUG of </=22.25 seconds in order to decrease risk of falls and improve functional mobility using LRAD. Baseline: 27.25 sec w/ rollator Goal status: INITIAL  ASSESSMENT:  CLINICAL IMPRESSION: Time spent reviewing HEP from prior episode of care this visit.  PT receives mixed reports of compliance to HEP so will continue to follow-up and modify as appropriate.  He continues to benefit from skilled PT to improve motor control and optimize safe independence with AD.  Continue per POC.  OBJECTIVE IMPAIRMENTS: Abnormal gait, decreased balance, decreased coordination, decreased knowledge of condition, decreased knowledge of use of DME, decreased strength,  decreased safety awareness, and improper body mechanics.   ACTIVITY LIMITATIONS: carrying, lifting, bending, standing, squatting, transfers, reach over head, and locomotion level  PARTICIPATION LIMITATIONS: cleaning, laundry, shopping, community activity, and yard work  PERSONAL FACTORS: Age, Fitness, Past/current experiences, Time since onset of  injury/illness/exacerbation, and 1-2 comorbidities: Cerebellar ataxia, mild cognitive impairment are also affecting patient's functional outcome.   REHAB POTENTIAL: Fair due to progressive nature of disease  CLINICAL DECISION MAKING: Evolving/moderate complexity  EVALUATION COMPLEXITY: Moderate  PLAN:  PT FREQUENCY: 2x/week  PT DURATION: 4 weeks + eval  PLANNED INTERVENTIONS: [id number]- PT Re-evaluation, 97750- Physical Performance Testing, 97110-Therapeutic exercises, 97530- Therapeutic activity, [id number]- Neuromuscular re-education, [phone]- Self Care, [id number]- Gait training, [phone]- Aquatic Therapy, Patient/Family education, Balance training, Stair training, Vestibular training, and DME instructions  PLAN FOR NEXT SESSION: Expand HEP prn.  Work w/ 2WW vs rollator indoor and outdoors - tight Psychiatrist.  Endurance - SciFit.  BLE strengthening.  Safety awareness - dual tasking!  Static balance, perturbations?  Step ups   Daved KATHEE Bull, PT, DPT 01/02/2024, 2:49 PM

## 2024-01-02 NOTE — Patient Instructions (Signed)
 From prior episode of care: Access Code: [email or web address] URL: [email or web address] Date: 01/02/2024 Prepared by: Daved Bull  Exercises - Sit to Stand Without Arm Support  - 1 x daily - 7 x weekly - 1 sets - 10 reps - Standing Hip Flexion with Counter Support  - 1 x daily - 7 x weekly - 1 sets - 10 reps - Standing Hip Extension with Counter Support  - 1 x daily - 7 x weekly - 1 sets - 10 reps - Standing Hip Abduction with Counter Support  - 1 x daily - 7 x weekly - 1 sets - 10 reps - Standing March with Counter Support  - 1 x daily - 7 x weekly - 1 sets - 10 reps - Seated Marching with Opposite Shoulder Flexion  - 1 x daily - 7 x weekly - 1 sets - 10 reps - Single Leg Stance with Support  - 1 x daily - 7 x weekly - 1 sets - 2 reps - 10 sec hold - Step Sideways  - 1 x daily - 7 x weekly - 1 sets - 10 reps

## 2024-01-05 ENCOUNTER — Ambulatory Visit: Admitting: Physical Therapy

## 2024-01-05 ENCOUNTER — Encounter: Payer: Self-pay | Admitting: Physical Therapy

## 2024-01-05 DIAGNOSIS — M6281 Muscle weakness (generalized): Secondary | ICD-10-CM

## 2024-01-05 DIAGNOSIS — R2681 Unsteadiness on feet: Secondary | ICD-10-CM

## 2024-01-05 DIAGNOSIS — R260 Ataxic gait: Secondary | ICD-10-CM

## 2024-01-05 DIAGNOSIS — R2689 Other abnormalities of gait and mobility: Secondary | ICD-10-CM | POA: Diagnosis not present

## 2024-01-05 DIAGNOSIS — R278 Other lack of coordination: Secondary | ICD-10-CM

## 2024-01-05 DIAGNOSIS — Z9181 History of falling: Secondary | ICD-10-CM

## 2024-01-05 NOTE — Therapy (Signed)
 OUTPATIENT PHYSICAL THERAPY NEURO TREATMENT   Patient Name: Derrick White MRN: [id number] DOB:01-Jun-1940, 83 y.o., male 65 Date: 01/05/2024   PCP: [name]., MD REFERRING PROVIDER: [name], MD  END OF SESSION:  PT End of Session - 01/05/24 1206     Visit Number 4    Number of Visits 9   8 + eval   Date for PT Re-Evaluation 02/06/24   pushed out due to delay in scheduling   Authorization Type [employer]    Progress Note Due on Visit 10    PT Start Time 1158    PT Stop Time 1232    PT Time Calculation (min) 34 min    Equipment Utilized During Treatment Gait belt    Activity Tolerance Patient tolerated treatment well    Behavior During Therapy [facility] for tasks assessed/performed          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   Memory loss 03/16/2019   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   Pain due to onychomycosis of toenails of both feet 04/28/2019   Pain in left ankle and joints of left foot 08/06/2018   Slurred speech 03/16/2019   Superficial peroneal nerve neuropathy, 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   TIA (transient ischemic attack) 11/05/2019   Worsened handwriting 03/16/2019   Past Surgical History:  Procedure Laterality Date   APPENDECTOMY     1940s   BACK SURGERY     1981, 1998   CARDIOVERSION N/A 09/13/2020   Procedure: CARDIOVERSION;  Surgeon: [name], MD;  Location: [facility] ENDOSCOPY;  Service: Cardiovascular;  Laterality: N/A;   FRACTURE SURGERY     Left ankle, sx x4   INGUINAL HERNIA REPAIR Right 03/05/2019   Procedure: OPEN RIGHT INGUINAL HERNIA REPAIR WITH MESH;  Surgeon: [name], MD;   Location: [facility] OR;  Service: General;  Laterality: Right;   KNEE SURGERY Left 1985   LEFT ATRIAL APPENDAGE OCCLUSION N/A 07/27/2020   Procedure: LEFT ATRIAL APPENDAGE OCCLUSION;  Surgeon: [name], MD;  [facility] INVASIVE CV LAB;  Service: Cardiovascular;  Laterality: N/A;   SHOULDER SURGERY Left    TEE WITHOUT CARDIOVERSION N/A 07/27/2020   Procedure: TRANSESOPHAGEAL ECHOCARDIOGRAM (TEE);  Surgeon: [name], MD;  Location: [facility] INVASIVE CV LAB;  Service: Cardiovascular;  Laterality: N/A;   TEE WITHOUT CARDIOVERSION N/A 09/13/2020   Procedure: TRANSESOPHAGEAL ECHOCARDIOGRAM (TEE);  Surgeon: [name], MD;  Location: [facility] ENDOSCOPY;  Service: Cardiovascular;  Laterality: N/A;   TONSILLECTOMY     1945   Patient Active Problem List   Diagnosis Date Noted   Pain in right shoulder 07/31/2021   Persistent atrial fibrillation (HCC) 08/28/2020   Secondary hypercoagulable state (HCC) 07/20/2020   Paroxysmal atrial fibrillation (HCC) 06/19/2020   TIA (transient ischemic attack) 11/05/2019   Essential hypertension 11/05/2019   Cerebellar ataxia (HCC) 11/05/2019   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   C6 radiculopathy 05/04/2019   Pain due to onychomycosis of toenails of both feet 04/28/2019   Worsened handwriting 03/16/2019   Gait disturbance 03/16/2019   Slurred speech 03/16/2019   Memory loss 03/16/2019   Pain in left ankle  and joints of left foot 08/06/2018   Mild cognitive impairment 12/12/2017   Foot pain, left 06/04/2016   Dysesthesia 06/04/2016   Superficial peroneal nerve neuropathy, 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   History of cervical spinal surgery 06/04/2016    ONSET DATE: several years  REFERRING DIAG:  R26.9 (ICD-10-CM) - Gait disturbance  R29.898 (ICD-10-CM) - Weakness of both lower extremities    THERAPY DIAG:  Other abnormalities of gait and mobility  Unsteadiness on feet  Other lack of coordination  Muscle weakness (generalized)  History of  falling  Ataxic gait  Rationale for Evaluation and Treatment: Rehabilitation  SUBJECTIVE:                                                                                                                                                                                             SUBJECTIVE STATEMENT:  Pt denies falls or pain.  He presents with rollator.  He states he has not done much this weekend, but went to a birthday party.  At onset of session he is retrieved from bathroom with rollator parked outside the door.  He states he is using his rollator more at home.  Pt accompanied by: self (he drove himself)  PERTINENT HISTORY: C6 radiculopathy, Cerebellar ataxia (HCC), Dysesthesia, Essential hypertension, Foot pain (left), Gait disturbance, History of cervical spinal surgery (06/04/2016), History of hiatal hernia, History of kidney stones, Lumbosacral radiculopathy at S1, Memory loss, Mild cognitive impairment, Pain due to onychomycosis of toenails of both feet, Pain in left ankle and joints of left foot, Slurred speech, Superficial peroneal nerve neuropathy, left (06/04/2009),TIA (transient ischemic attack) 11/05/2019. NCV/EMG study did not show any evidence of motor neuron disease. We did the [employer] SCA panel (30+ genes). It was borderline. 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 No  PRECAUTIONS: Fall  RED FLAGS: None   WEIGHT BEARING RESTRICTIONS: No  FALLS: Has patient fallen in last 6 months? Yes. Number of falls 2 - both without rollator/AD  LIVING ENVIRONMENT: Lives with: lives with their spouse Lives in: House/apartment Stairs: Yes: Internal: 12 steps; can reach both and External: 2 steps; can reach both - in back; pt reports he does not access 2nd level of home - has bedroom on lower level Has  following equipment at home: [name] - 4 wheeled, walking canes  PLOF: Independent with basic ADLs, Independent with household mobility with device, Independent with community mobility with device, and  Needs assistance with homemaking  PATIENT GOALS: to get better, but no specific goal  OBJECTIVE:  Note: Objective measures were completed at Evaluation unless otherwise noted.  DIAGNOSTIC FINDINGS: No recent relevant imaging.  COGNITION: Overall cognitive status: History of cognitive impairments - at baseline   SENSATION: WFL  COORDINATION: Ataxia of BLE  POSTURE: rounded shoulders and forward head  LOWER EXTREMITY ROM:   BLE WFL   LOWER EXTREMITY MMT (tested in seated):    MMT Right Eval Left Eval  Hip flexion 5 5  Hip extension    Hip abduction 4+ 4+  Hip adduction    Hip internal rotation    Hip external rotation    Knee flexion 5 5  Knee extension 5 5  Ankle dorsiflexion 5 5  Ankle plantarflexion    Ankle inversion    Ankle eversion    (Blank rows = not tested)  BED MOBILITY:  Modified independent - sleeps in standard bed w/o added features  TRANSFERS: Assistive device utilized: Environmental consultant - 4 wheeled  Sit to stand: SBA and cues for using brakes appropriate Stand to sit: SBA and cues for rollator brake use  GAIT: Gait pattern: ataxic Distance walked: various clinic distances ranging from 40-100' Assistive device utilized: Environmental consultant - 4 wheeled Level of assistance: CGA Comments: needs cues to maintain BUE on handles as pt distracted in open gym  FUNCTIONAL TESTS:  12/26/2023 EVAL: 5 times sit to stand: 19.34 sec no UE support w/ back of knees on chair CGA, declined edu to use UE for balance and to come completely into upright Timed up and go (TUG): 27.25 sec, significant difficulty turning to sit requiring mod cues to improve approach 10 meter walk test: 14.78 sec w/ rollator CGA w/ increased trunk lean = 0.23m/sec = 2.23 ft/sec  FROM PRIOR EVAL: 5 times sit  to stand: 13.57 secs without UE support from mat Timed up and go (TUG): 24.12 secs 10 meter walk test: 18.19 secs = 1.80 ft/sec with RW                                                                                                                              TREATMENT DATE: 01/05/24  -SciFit in multi-peaks mode x8 minutes up to level 6.0 using BUE/BLE for cardiac endurance, BLE strengthening, and dynamic neural priming.   -6 step taps w/ BUE support 2x30 alternating LE -6 step up and overs x20 alternating leading LE w/ BUE support -6 lateral step ups x2 rounds of x16 each side w/ BUE support working on upright posture and motor planning on step down as pt has difficulty w/ spacing of feet  -Immediately following task pt reports needing to leave, no particular reason and he appears in no acute distress.  PT ambulates with pt to front lobby and reminds him of next appt - pt confirming he will be here.  PATIENT EDUCATION: Education details: Please work on HEP - can reduce frequency  but importance of consistent movement. Continue going to senior center - he goes once a week.  Please use rollator at all times! Person educated: Patient Education method: Explanation, Demonstration, and Verbal cues Education comprehension: verbalized understanding and needs further education  HOME EXERCISE PROGRAM: Access Code: [email or web address] URL: [email or web address] Date: 01/02/2024 Prepared by: Daved Bull  Exercises - Sit to Stand Without Arm Support  - 1 x daily - 7 x weekly - 1 sets - 10 reps - Standing Hip Flexion with Counter Support  - 1 x daily - 7 x weekly - 1 sets - 10 reps - Standing Hip Extension with Counter Support  - 1 x daily - 7 x weekly - 1 sets - 10 reps - Standing Hip Abduction with Counter Support  - 1 x daily - 7 x weekly - 1 sets - 10 reps - Standing March with Counter Support  - 1 x daily - 7 x weekly - 1 sets - 10 reps - Seated Marching with Opposite Shoulder  Flexion  - 1 x daily - 7 x weekly - 1 sets - 10 reps - Single Leg Stance with Support  - 1 x daily - 7 x weekly - 1 sets - 2 reps - 10 sec hold - Step Sideways  - 1 x daily - 7 x weekly - 1 sets - 10 reps  GOALS: Goals reviewed with patient? Yes  SHORT TERM GOALS: SAME AS LTG'S   LONG TERM GOALS: Target date:  01/23/2024  Pt will be independent and compliant with strength and balance focused HEP in order to maintain functional progress and improve mobility. Baseline: Will review from prior. Goal status: INITIAL  2.  Pt will decrease 5xSTS to </=14.34 seconds using safest method to obtain upright in order to demonstrate decreased risk for falls and improved functional bilateral LE strength and power. Baseline:  19.34 sec no UE support w/ back of knees on chair CGA Goal status: INITIAL  3.  Pt will demonstrate a gait speed of >/=2.43 feet/sec in order to decrease risk for falls. Baseline:  2.23 ft/sec w/ rollator Goal status: INITIAL  4.  Pt will demonstrate TUG of </=22.25 seconds in order to decrease risk of falls and improve functional mobility using LRAD. Baseline: 27.25 sec w/ rollator Goal status: INITIAL  ASSESSMENT:  CLINICAL IMPRESSION: Focus of skilled session today on progressing to functional strengthening and motor planning.  He was most challenged by lateral step ups with LE spacing and forward trunk lean.  He requested to leave early this visit for unknown reasoning.  He continues to benefit from skilled PT in order to improve motor control and decrease risk of falls.  Continue per POC.  OBJECTIVE IMPAIRMENTS: Abnormal gait, decreased balance, decreased coordination, decreased knowledge of condition, decreased knowledge of use of DME, decreased strength, decreased safety awareness, and improper body mechanics.   ACTIVITY LIMITATIONS: carrying, lifting, bending, standing, squatting, transfers, reach over head, and locomotion level  PARTICIPATION LIMITATIONS: cleaning,  laundry, shopping, community activity, and yard work  PERSONAL FACTORS: Age, Fitness, Past/current experiences, Time since onset of injury/illness/exacerbation, and 1-2 comorbidities: Cerebellar ataxia, mild cognitive impairment are also affecting patient's functional outcome.   REHAB POTENTIAL: Fair due to progressive nature of disease  CLINICAL DECISION MAKING: Evolving/moderate complexity  EVALUATION COMPLEXITY: Moderate  PLAN:  PT FREQUENCY: 2x/week  PT DURATION: 4 weeks + eval  PLANNED INTERVENTIONS: [id number]- PT Re-evaluation, 97750- Physical Performance Testing, 97110-Therapeutic exercises, 97530- Therapeutic activity, [id number]- Neuromuscular re-education, 97535- Self Care, [id number]-  Gait training, [id number]- Aquatic Therapy, Patient/Family education, Balance training, Stair training, Vestibular training, and DME instructions  PLAN FOR NEXT SESSION: Expand HEP prn.  Work w/ 2WW vs rollator indoor and outdoors - tight Psychiatrist.  Endurance - SciFit.  BLE strengthening.  Safety awareness - dual tasking!  Static balance, perturbations?   Daved KATHEE Bull, PT, DPT 01/05/2024, 1:03 PM

## 2024-01-06 DIAGNOSIS — R3915 Urgency of urination: Secondary | ICD-10-CM | POA: Diagnosis not present

## 2024-01-06 DIAGNOSIS — R350 Frequency of micturition: Secondary | ICD-10-CM | POA: Diagnosis not present

## 2024-01-09 ENCOUNTER — Encounter: Payer: Self-pay | Admitting: Physical Therapy

## 2024-01-09 ENCOUNTER — Ambulatory Visit: Admitting: Physical Therapy

## 2024-01-09 DIAGNOSIS — R2681 Unsteadiness on feet: Secondary | ICD-10-CM

## 2024-01-09 DIAGNOSIS — R278 Other lack of coordination: Secondary | ICD-10-CM

## 2024-01-09 DIAGNOSIS — M6281 Muscle weakness (generalized): Secondary | ICD-10-CM

## 2024-01-09 DIAGNOSIS — Z9181 History of falling: Secondary | ICD-10-CM

## 2024-01-09 DIAGNOSIS — R2689 Other abnormalities of gait and mobility: Secondary | ICD-10-CM | POA: Diagnosis not present

## 2024-01-09 DIAGNOSIS — R260 Ataxic gait: Secondary | ICD-10-CM

## 2024-01-09 NOTE — Therapy (Signed)
 OUTPATIENT PHYSICAL THERAPY NEURO TREATMENT   Patient Name: ONNIE HATCHEL MRN: [id number] DOB:1940-12-04, 83 y.o., male 64 Date: 01/09/2024   PCP: [name]., MD REFERRING PROVIDER: [name], MD  END OF SESSION:  PT End of Session - 01/09/24 1110     Visit Number 5    Number of Visits 9   8 + eval   Date for PT Re-Evaluation 02/06/24   pushed out due to delay in scheduling   Authorization Type [employer]    Progress Note Due on Visit 10    PT Start Time 1104    PT Stop Time 1145    PT Time Calculation (min) 41 min    Equipment Utilized During Treatment Gait belt    Activity Tolerance Patient tolerated treatment well    Behavior During Therapy WFL for tasks assessed/performed          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 05/04/2019   Memory loss 03/16/2019   Mild cognitive impairment 12/12/2017   Pain due to onychomycosis of toenails of both feet 04/28/2019   Pain in left ankle and joints of left foot 08/06/2018   Slurred speech 03/16/2019   Superficial peroneal nerve neuropathy, 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   TIA (transient ischemic attack) 11/05/2019   Worsened handwriting 03/16/2019   Past Surgical History:  Procedure Laterality Date   APPENDECTOMY     1940s   BACK SURGERY     1981, 1998   CARDIOVERSION N/A 09/13/2020   Procedure: CARDIOVERSION;  Surgeon: [name], MD;  Location: [facility] ENDOSCOPY;  Service: Cardiovascular;  Laterality: N/A;   FRACTURE SURGERY     Left ankle, sx x4   INGUINAL HERNIA REPAIR Right 03/05/2019   Procedure: OPEN RIGHT INGUINAL HERNIA REPAIR WITH MESH;  Surgeon: [name], MD;   Location: [facility] OR;  Service: General;  Laterality: Right;   KNEE SURGERY Left 1985   LEFT ATRIAL APPENDAGE OCCLUSION N/A 07/27/2020   Procedure: LEFT ATRIAL APPENDAGE OCCLUSION;  Surgeon: [name], MD;  Location[facility] INVASIVE CV LAB;  Service: Cardiovascular;  Laterality: N/A;   SHOULDER SURGERY Left    TEE WITHOUT CARDIOVERSION N/A 07/27/2020   Procedure: TRANSESOPHAGEAL ECHOCARDIOGRAM (TEE);  Surgeon: [name], MD;  Location: [facility] INVASIVE CV LAB;  Service: Cardiovascular;  Laterality: N/A;   TEE WITHOUT CARDIOVERSION N/A 09/13/2020   Procedure: TRANSESOPHAGEAL ECHOCARDIOGRAM (TEE);  Surgeon: [name], MD;  Location: [facility] ENDOSCOPY;  Service: Cardiovascular;  Laterality: N/A;   TONSILLECTOMY     1945   Patient Active Problem List   Diagnosis Date Noted   Pain in right shoulder 07/31/2021   Persistent atrial fibrillation (HCC) 08/28/2020   Secondary hypercoagulable state (HCC) 07/20/2020   Paroxysmal atrial fibrillation (HCC) 06/19/2020   TIA (transient ischemic attack) 11/05/2019   Essential hypertension 11/05/2019   Cerebellar ataxia (HCC) 11/05/2019   Lumbosacral radiculopathy at S1 05/04/2019   C6 radiculopathy 05/04/2019   Pain due to onychomycosis of toenails of both feet 04/28/2019   Worsened handwriting 03/16/2019   Gait disturbance 03/16/2019   Slurred speech 03/16/2019   Memory loss 03/16/2019   Pain in left ankle  and joints of left foot 08/06/2018   Mild cognitive impairment 12/12/2017   Foot pain, left 06/04/2016   Dysesthesia 06/04/2016   Superficial peroneal nerve neuropathy, left 06/04/2016   History of cervical spinal surgery 06/04/2016    ONSET DATE: several years  REFERRING DIAG:  R26.9 (ICD-10-CM) - Gait disturbance  R29.898 (ICD-10-CM) - Weakness of both lower extremities    THERAPY DIAG:  Other abnormalities of gait and mobility  Unsteadiness on feet  Other lack of coordination  Muscle weakness (generalized)  History of  falling  Ataxic gait  Rationale for Evaluation and Treatment: Rehabilitation  SUBJECTIVE:                                                                                                                                                                                             SUBJECTIVE STATEMENT:  Pt denies falls or pain.  He presents with rollator.  He reports he is doing great today.  Pt accompanied by: self (he drove himself)  PERTINENT HISTORY: C6 radiculopathy, Cerebellar ataxia (HCC), Dysesthesia, Essential hypertension, Foot pain (left), Gait disturbance, History of cervical spinal surgery (06/04/2016), History of hiatal hernia, History of kidney stones, Lumbosacral radiculopathy at S1, Memory loss, Mild cognitive impairment, Pain due to onychomycosis of toenails of both feet, Pain in left ankle and joints of left foot, Slurred speech, Superficial peroneal nerve neuropathy, left (06/04/2009),TIA (transient ischemic attack) 11/05/2019. NCV/EMG study did not show any evidence of motor neuron disease. We did the [employer] SCA panel (30+ genes). It was borderline. 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 Unclear if this is playing a role (SCA type 6 has mutations in this [place] but usually triplet repeat)     PAIN:  Are you having pain? No  PRECAUTIONS: Fall  RED FLAGS: None   WEIGHT BEARING RESTRICTIONS: No  FALLS: Has patient fallen in last 6 months? Yes. Number of falls 2 - both without rollator/AD  LIVING ENVIRONMENT: Lives with: lives with their spouse Lives in: House/apartment Stairs: Yes: Internal: 12 steps; can reach both and External: 2 steps; can reach both - in back; pt reports he does not access 2nd level of home - has bedroom on lower level Has following equipment at home: [name] - 4 wheeled, walking canes  PLOF: Independent with basic ADLs, Independent with household mobility with device, Independent with community  mobility with device, and Needs assistance with homemaking  PATIENT GOALS: to get better, but no specific goal  OBJECTIVE:  Note: Objective measures were completed at Evaluation unless otherwise noted.  DIAGNOSTIC FINDINGS: No recent relevant imaging.  COGNITION: Overall cognitive status: History of cognitive impairments - at baseline   SENSATION: WFL  COORDINATION: Ataxia of BLE  POSTURE: rounded shoulders and forward head  LOWER EXTREMITY ROM:   BLE WFL   LOWER EXTREMITY MMT (tested in seated):    MMT Right Eval Left Eval  Hip flexion 5 5  Hip extension    Hip abduction 4+ 4+  Hip adduction    Hip internal rotation    Hip external rotation    Knee flexion 5 5  Knee extension 5 5  Ankle dorsiflexion 5 5  Ankle plantarflexion    Ankle inversion    Ankle eversion    (Blank rows = not tested)  BED MOBILITY:  Modified independent - sleeps in standard bed w/o added features  TRANSFERS: Assistive device utilized: Environmental consultant - 4 wheeled  Sit to stand: SBA and cues for using brakes appropriate Stand to sit: SBA and cues for rollator brake use  GAIT: Gait pattern: ataxic Distance walked: various clinic distances ranging from 40-100' Assistive device utilized: Environmental consultant - 4 wheeled Level of assistance: CGA Comments: needs cues to maintain BUE on handles as pt distracted in open gym  FUNCTIONAL TESTS:  12/26/2023 EVAL: 5 times sit to stand: 19.34 sec no UE support w/ back of knees on chair CGA, declined edu to use UE for balance and to come completely into upright Timed up and go (TUG): 27.25 sec, significant difficulty turning to sit requiring mod cues to improve approach 10 meter walk test: 14.78 sec w/ rollator CGA w/ increased trunk lean = 0.55m/sec = 2.23 ft/sec  FROM PRIOR EVAL: 5 times sit to stand: 13.57 secs without UE support from mat Timed up and go (TUG): 24.12 secs 10 meter walk test: 18.19 secs = 1.80 ft/sec with RW                                                                                                                               TREATMENT DATE: 01/09/24     6 Blaze pods on random one color taps setting for improved SLS, ankle strategy, and dynamic stability.  Performed on 1 minute intervals with 30 second rest periods.  Pt requires SBA guarding using unilateral UE support throughout. Round 1:  mirror and floor pod setup w/ lateral stepping on blue mat surface.  10 hits. Round 2:   setup.  15 hits. Round 3:   setup.  14 hits. Notable errors/deficits:  Cues to lean into taps for increased force production especially with overhead reach. 6 Blaze pods on random one color taps setting for improved SLS, ankle strategy, and dynamic stability.  Performed on 1 minute intervals with 30 second rest periods.  Pt requires SBA guarding using unilateral UE support throughout. Round 1:  mirror and floor pod setup w/ lateral stepping on blue mat surface w/ 4 hurdles.  7 hits. Round 2:   setup.  13 hits. Round 3:   setup.  14 hits. Notable errors/deficits:  Ongoing cues to improve overhead reaching. STS w/ 8lb ball x15 CGA-minA to facilitate forward lean STS into alternating forward step out x10 each LE CGA SciFit in multi-peaks mode x8 minutes up to level 6.0 using BUE/BLE for cardiac endurance and BLE strengthening.  PATIENT EDUCATION: Education details: Please work on HEP - can reduce frequency but importance of consistent movement. Continue going to senior center - he goes once a week.  Please use rollator at all times! Person educated: Patient Education method: Explanation, Demonstration, and Verbal cues Education comprehension: verbalized understanding and needs further education  HOME EXERCISE PROGRAM: Access Code: [email or web address] URL: [email or web address] Date: 01/02/2024 Prepared by: Daved Bull  Exercises - Sit to Stand Without Arm Support  - 1 x daily - 7 x weekly - 1 sets - 10 reps - Standing Hip Flexion with  Counter Support  - 1 x daily - 7 x weekly - 1 sets - 10 reps - Standing Hip Extension with Counter Support  - 1 x daily - 7 x weekly - 1 sets - 10 reps - Standing Hip Abduction with Counter Support  - 1 x daily - 7 x weekly - 1 sets - 10 reps - Standing March with Counter Support  - 1 x daily - 7 x weekly - 1 sets - 10 reps - Seated Marching with Opposite Shoulder Flexion  - 1 x daily - 7 x weekly - 1 sets - 10 reps - Single Leg Stance with Support  - 1 x daily - 7 x weekly - 1 sets - 2 reps - 10 sec hold - Step Sideways  - 1 x daily - 7 x weekly - 1 sets - 10 reps  GOALS: Goals reviewed with patient? Yes  SHORT TERM GOALS: SAME AS LTG'S   LONG TERM GOALS: Target date:  01/23/2024  Pt will be independent and compliant with strength and balance focused HEP in order to maintain functional progress and improve mobility. Baseline: Will review from prior. Goal status: INITIAL  2.  Pt will decrease 5xSTS to </=14.34 seconds using safest method to obtain upright in order to demonstrate decreased risk for falls and improved functional bilateral LE strength and power. Baseline:  19.34 sec no UE support w/ back of knees on chair CGA Goal status: INITIAL  3.  Pt will demonstrate a gait speed of >/=2.43 feet/sec in order to decrease risk for falls. Baseline:  2.23 ft/sec w/ rollator Goal status: INITIAL  4.  Pt will demonstrate TUG of </=22.25 seconds in order to decrease risk of falls and improve functional mobility using LRAD. Baseline: 27.25 sec w/ rollator Goal status: INITIAL  ASSESSMENT:  CLINICAL IMPRESSION: Emphasis of skilled session today on ongoing work to strengthen immediate standing stability and dynamic stepping strategy.  He was challenged by overhead reach due to ataxia and some posterior bias.  He continues to benefit from skilled PT in this setting to decrease fall risk and improve safe use of rollator.  Continue per POC.  OBJECTIVE IMPAIRMENTS: Abnormal gait, decreased  balance, decreased coordination, decreased knowledge of condition, decreased knowledge of use of DME, decreased strength, decreased safety awareness, and improper body mechanics.   ACTIVITY LIMITATIONS: carrying, lifting, bending, standing, squatting, transfers, reach over head, and locomotion level  PARTICIPATION LIMITATIONS: cleaning, laundry, shopping, community activity, and yard work  PERSONAL FACTORS: Age, Fitness, Past/current experiences, Time since onset of injury/illness/exacerbation, and 1-2 comorbidities: Cerebellar ataxia, mild cognitive impairment are also affecting patient's functional outcome.  REHAB POTENTIAL: Fair due to progressive nature of disease  CLINICAL DECISION MAKING: Evolving/moderate complexity  EVALUATION COMPLEXITY: Moderate  PLAN:  PT FREQUENCY: 2x/week  PT DURATION: 4 weeks + eval  PLANNED INTERVENTIONS: [id number]- PT Re-evaluation, 97750- Physical Performance Testing, 97110-Therapeutic exercises, 97530- Therapeutic activity, [id number]- Neuromuscular re-education, [phone]- Self Care, [id number]- Gait training, [phone]- Aquatic Therapy, Patient/Family education, Balance training, Stair training, Vestibular training, and DME instructions  PLAN FOR NEXT SESSION: Expand HEP prn.  Work w/ 2WW vs rollator indoor and outdoors - tight Psychiatrist.  Endurance - SciFit.  BLE strengthening.  Safety awareness - dual tasking!  Static balance, perturbations?   Daved KATHEE Bull, PT, DPT 01/09/2024, 12:22 PM

## 2024-01-16 ENCOUNTER — Encounter: Payer: Self-pay | Admitting: Physical Therapy

## 2024-01-16 ENCOUNTER — Ambulatory Visit: Admitting: Physical Therapy

## 2024-01-16 DIAGNOSIS — Z9181 History of falling: Secondary | ICD-10-CM

## 2024-01-16 DIAGNOSIS — R2689 Other abnormalities of gait and mobility: Secondary | ICD-10-CM

## 2024-01-16 DIAGNOSIS — R260 Ataxic gait: Secondary | ICD-10-CM

## 2024-01-16 DIAGNOSIS — R2681 Unsteadiness on feet: Secondary | ICD-10-CM

## 2024-01-16 DIAGNOSIS — R278 Other lack of coordination: Secondary | ICD-10-CM

## 2024-01-16 DIAGNOSIS — M6281 Muscle weakness (generalized): Secondary | ICD-10-CM

## 2024-01-16 NOTE — Therapy (Signed)
 OUTPATIENT PHYSICAL THERAPY NEURO TREATMENT   Patient Name: Derrick White MRN: [id number] DOB:11-26-1940, 83 y.o., male 9 Date: 01/16/2024   PCP: Derrick [name]., MD REFERRING PROVIDER: [name], MD  END OF SESSION:  PT End of Session - 01/16/24 1109     Visit Number 6    Number of Visits 9   8 + eval   Date for PT Re-Evaluation 02/06/24   pushed out due to delay in scheduling   Authorization Type [employer]    Progress Note Due on Visit 10    PT Start Time 1105    PT Stop Time 1148    PT Time Calculation (min) 43 min    Equipment Utilized During Treatment Gait belt    Activity Tolerance Patient tolerated treatment well    Behavior During Therapy WFL for tasks assessed/performed          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 05/04/2019   Memory loss 03/16/2019   Mild cognitive impairment 12/12/2017   Pain due to onychomycosis of toenails of both feet 04/28/2019   Pain in left ankle and joints of left foot 08/06/2018   Slurred speech 03/16/2019   Superficial peroneal nerve neuropathy, 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   TIA (transient ischemic attack) 11/05/2019   Worsened handwriting 03/16/2019   Past Surgical History:  Procedure Laterality Date   APPENDECTOMY     1940s   BACK SURGERY     1981, 1998   CARDIOVERSION N/A 09/13/2020   Procedure: CARDIOVERSION;  Surgeon: Derrick Headland, MD;  Location: [facility] ENDOSCOPY;  Service: Cardiovascular;  Laterality: N/A;   FRACTURE SURGERY     Left ankle, sx x4   INGUINAL HERNIA REPAIR Right 03/05/2019   Procedure: OPEN RIGHT INGUINAL HERNIA REPAIR WITH MESH;  Surgeon: Derrick Mitzie LABOR, MD;   Location: [facility] OR;  Service: General;  Laterality: Right;   KNEE SURGERY Left 1985   LEFT ATRIAL APPENDAGE OCCLUSION N/A 07/27/2020   Procedure: LEFT ATRIAL APPENDAGE OCCLUSION;  Surgeon: Derrick Sharper, MD;  Location: [facility] INVASIVE CV LAB;  Service: Cardiovascular;  Laterality: N/A;   SHOULDER SURGERY Left    TEE WITHOUT CARDIOVERSION N/A 07/27/2020   Procedure: TRANSESOPHAGEAL ECHOCARDIOGRAM (TEE);  Surgeon: Derrick Sharper, MD;  Location: [facility] INVASIVE CV LAB;  Service: Cardiovascular;  Laterality: N/A;   TEE WITHOUT CARDIOVERSION N/A 09/13/2020   Procedure: TRANSESOPHAGEAL ECHOCARDIOGRAM (TEE);  Surgeon: Derrick Headland, MD;  Location: [facility] ENDOSCOPY;  Service: Cardiovascular;  Laterality: N/A;   TONSILLECTOMY     1945   Patient Active Problem List   Diagnosis Date Noted   Pain in right shoulder 07/31/2021   Persistent atrial fibrillation (HCC) 08/28/2020   Secondary hypercoagulable state (HCC) 07/20/2020   Paroxysmal atrial fibrillation (HCC) 06/19/2020   TIA (transient ischemic attack) 11/05/2019   Essential hypertension 11/05/2019   Cerebellar ataxia (HCC) 11/05/2019   Lumbosacral radiculopathy at S1 05/04/2019   C6 radiculopathy 05/04/2019   Pain due to onychomycosis of toenails of both feet 04/28/2019   Worsened handwriting 03/16/2019   Gait disturbance 03/16/2019   Slurred speech 03/16/2019   Memory loss 03/16/2019   Pain in left ankle  and joints of left foot 08/06/2018   Mild cognitive impairment 12/12/2017   Foot pain, left 06/04/2016   Dysesthesia 06/04/2016   Superficial peroneal nerve neuropathy, 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   History of cervical spinal surgery 06/04/2016    ONSET DATE: several years  REFERRING DIAG:  R26.9 (ICD-10-CM) - Gait disturbance  R29.898 (ICD-10-CM) - Weakness of both lower extremities    THERAPY DIAG:  Other abnormalities of gait and mobility  Unsteadiness on feet  Other lack of coordination  Muscle weakness (generalized)  History of  falling  Ataxic gait  Rationale for Evaluation and Treatment: Rehabilitation  SUBJECTIVE:                                                                                                                                                                                             SUBJECTIVE STATEMENT:  Pt denies falls or pain.  He presents with rollator.  He reports he is using his rollator in home more.  Pt accompanied by: self (he drove himself)  PERTINENT HISTORY: C6 radiculopathy, Cerebellar ataxia (HCC), Dysesthesia, Essential hypertension, Foot pain (left), Gait disturbance, History of cervical spinal surgery (06/04/2016), History of hiatal hernia, History of kidney stones, Lumbosacral radiculopathy at S1, Memory loss, Mild cognitive impairment, Pain due to onychomycosis of toenails of both feet, Pain in left ankle and joints of left foot, Slurred speech, Superficial peroneal nerve neuropathy, left (06/04/2009),TIA (transient ischemic attack) 11/05/2019. NCV/EMG study did not show any evidence of motor neuron disease. We did the [employer] SCA panel (30+ genes). It was borderline. 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 No  PRECAUTIONS: Fall  RED FLAGS: None   WEIGHT BEARING RESTRICTIONS: No  FALLS: Has patient fallen in last 6 months? Yes. Number of falls 2 - both without rollator/AD  LIVING ENVIRONMENT: Lives with: lives with their spouse Lives in: House/apartment Stairs: Yes: Internal: 12 steps; can reach both and External: 2 steps; can reach both - in back; pt reports he does not access 2nd level of home - has bedroom on lower level Has following equipment at home: [name] - 4 wheeled, walking canes  PLOF: Independent with basic ADLs, Independent with household mobility with device, Independent with  community mobility with device, and Needs assistance with homemaking  PATIENT GOALS: to get better, but no specific goal  OBJECTIVE:  Note: Objective measures were completed at Evaluation unless otherwise noted.  DIAGNOSTIC FINDINGS: No recent  relevant imaging.  COGNITION: Overall cognitive status: History of cognitive impairments - at baseline   SENSATION: WFL  COORDINATION: Ataxia of BLE  POSTURE: rounded shoulders and forward head  LOWER EXTREMITY ROM:   BLE WFL   LOWER EXTREMITY MMT (tested in seated):    MMT Right Eval Left Eval  Hip flexion 5 5  Hip extension    Hip abduction 4+ 4+  Hip adduction    Hip internal rotation    Hip external rotation    Knee flexion 5 5  Knee extension 5 5  Ankle dorsiflexion 5 5  Ankle plantarflexion    Ankle inversion    Ankle eversion    (Blank rows = not tested)  BED MOBILITY:  Modified independent - sleeps in standard bed w/o added features  TRANSFERS: Assistive device utilized: Environmental consultant - 4 wheeled  Sit to stand: SBA and cues for using brakes appropriate Stand to sit: SBA and cues for rollator brake use  GAIT: Gait pattern: ataxic Distance walked: various clinic distances ranging from 40-100' Assistive device utilized: Environmental consultant - 4 wheeled Level of assistance: CGA Comments: needs cues to maintain BUE on handles as pt distracted in open gym  FUNCTIONAL TESTS:  12/26/2023 EVAL: 5 times sit to stand: 19.34 sec no UE support w/ back of knees on chair CGA, declined edu to use UE for balance and to come completely into upright Timed up and go (TUG): 27.25 sec, significant difficulty turning to sit requiring mod cues to improve approach 10 meter walk test: 14.78 sec w/ rollator CGA w/ increased trunk lean = 0.15m/sec = 2.23 ft/sec  FROM PRIOR EVAL: 5 times sit to stand: 13.57 secs without UE support from mat Timed up and go (TUG): 24.12 secs 10 meter walk test: 18.19 secs = 1.80 ft/sec with RW                                                                                                                               TREATMENT DATE: 01/16/24     Standing wall bumps x15 forward > backward > laterally for ankle and hip strategy Standing ball toss up wall x30 CGA and PT retrieving ball when pt having more ataxic mobility Standing normal BOS on airex unsupported CGA moving rings on half hoop 1 round left and 1 round right using crossbody reach, mild sway STS x11 on airex SBA, cues for anterior weight shift Standing normal stance on airex SBA-CGA for ring toss x5 rounds, improving proximity to target w/ repeated attempts SciFit in multi-peaks mode x8 minutes up to level 6.0 using BUE/BLE for cardiac endurance and BLE strengthening.  PATIENT EDUCATION: Education details: Please work on HEP - can reduce frequency but importance of consistent movement. Continue going to senior center - he goes once a week.  Please use rollator at all times!  Goal assessment next visit to determine further POC. Person educated: Patient Education method: Explanation, Demonstration, and Verbal cues Education  comprehension: verbalized understanding and needs further education  HOME EXERCISE PROGRAM: Access Code: [email or web address] URL: [email or web address] Date: 01/02/2024 Prepared by: Daved Bull  Exercises - Sit to Stand Without Arm Support  - 1 x daily - 7 x weekly - 1 sets - 10 reps - Standing Hip Flexion with Counter Support  - 1 x daily - 7 x weekly - 1 sets - 10 reps - Standing Hip Extension with Counter Support  - 1 x daily - 7 x weekly - 1 sets - 10 reps - Standing Hip Abduction with Counter Support  - 1 x daily - 7 x weekly - 1 sets - 10 reps - Standing March with Counter Support  - 1 x daily - 7 x weekly - 1 sets - 10 reps - Seated Marching with Opposite Shoulder Flexion  - 1 x daily - 7 x weekly - 1 sets - 10 reps - Single Leg Stance with Support  - 1 x daily - 7 x weekly - 1 sets - 2 reps - 10 sec hold - Step  Sideways  - 1 x daily - 7 x weekly - 1 sets - 10 reps  GOALS: Goals reviewed with patient? Yes  SHORT TERM GOALS: SAME AS LTG'S   LONG TERM GOALS: Target date:  01/23/2024  Pt will be independent and compliant with strength and balance focused HEP in order to maintain functional progress and improve mobility. Baseline: Will review from prior. Goal status: INITIAL  2.  Pt will decrease 5xSTS to </=14.34 seconds using safest method to obtain upright in order to demonstrate decreased risk for falls and improved functional bilateral LE strength and power. Baseline:  19.34 sec no UE support w/ back of knees on chair CGA Goal status: INITIAL  3.  Pt will demonstrate a gait speed of >/=2.43 feet/sec in order to decrease risk for falls. Baseline:  2.23 ft/sec w/ rollator Goal status: INITIAL  4.  Pt will demonstrate TUG of </=22.25 seconds in order to decrease risk of falls and improve functional mobility using LRAD. Baseline: 27.25 sec w/ rollator Goal status: INITIAL  ASSESSMENT:  CLINICAL IMPRESSION: Focus of skilled session today on dual tasking for static stability.  Pt has good progression of proximity to target with standing ring toss, but was very challenged with anticipatory and reactive strategies with wall ball toss.  His coordination and stability fluctuate as expected for diagnosis.  LTGs to be assessed next session with further determination of POC.  Continue per POC.  OBJECTIVE IMPAIRMENTS: Abnormal gait, decreased balance, decreased coordination, decreased knowledge of condition, decreased knowledge of use of DME, decreased strength, decreased safety awareness, and improper body mechanics.   ACTIVITY LIMITATIONS: carrying, lifting, bending, standing, squatting, transfers, reach over head, and locomotion level  PARTICIPATION LIMITATIONS: cleaning, laundry, shopping, community activity, and yard work  PERSONAL FACTORS: Age, Fitness, Past/current experiences, Time since onset  of injury/illness/exacerbation, and 1-2 comorbidities: Cerebellar ataxia, mild cognitive impairment are also affecting patient's functional outcome.   REHAB POTENTIAL: Fair due to progressive nature of disease  CLINICAL DECISION MAKING: Evolving/moderate complexity  EVALUATION COMPLEXITY: Moderate  PLAN:  PT FREQUENCY: 2x/week  PT DURATION: 4 weeks + eval  PLANNED INTERVENTIONS: [id number]- PT Re-evaluation, 97750- Physical Performance Testing, 97110-Therapeutic exercises, 97530- Therapeutic activity, [id number]- Neuromuscular re-education, [phone]- Self Care, [id number]- Gait training, [phone]- Aquatic Therapy, Patient/Family education, Balance training, Stair training, Vestibular training, and DME instructions  PLAN FOR NEXT SESSION: Expand HEP prn.  Work w/ 2WW vs rollator indoor and outdoors -  tight spaces/turning/brake safety.  Endurance - SciFit >/= level 6.0.  BLE strengthening.  Safety awareness - dual tasking!  Static balance, perturbations?  ASSESS LTGs - D/C? - if re-certing drop to 1x/wk.  Daved KATHEE Bull, PT, DPT 01/16/2024, 11:52 AM

## 2024-01-21 ENCOUNTER — Ambulatory Visit: Attending: Neurology | Admitting: Physical Therapy

## 2024-01-21 ENCOUNTER — Encounter: Payer: Self-pay | Admitting: Physical Therapy

## 2024-01-21 DIAGNOSIS — R278 Other lack of coordination: Secondary | ICD-10-CM | POA: Diagnosis not present

## 2024-01-21 DIAGNOSIS — M6281 Muscle weakness (generalized): Secondary | ICD-10-CM | POA: Insufficient documentation

## 2024-01-21 DIAGNOSIS — R2681 Unsteadiness on feet: Secondary | ICD-10-CM | POA: Diagnosis not present

## 2024-01-21 DIAGNOSIS — R2689 Other abnormalities of gait and mobility: Secondary | ICD-10-CM | POA: Diagnosis not present

## 2024-01-21 DIAGNOSIS — R260 Ataxic gait: Secondary | ICD-10-CM | POA: Insufficient documentation

## 2024-01-21 DIAGNOSIS — Z9181 History of falling: Secondary | ICD-10-CM | POA: Diagnosis not present

## 2024-01-21 NOTE — Therapy (Signed)
 OUTPATIENT PHYSICAL THERAPY NEURO TREATMENT - RECERTIFICATION   Patient Name: Derrick White MRN: [id number] DOB:1941-01-07, 83 y.o., male 46 Date: 01/21/2024   PCP: Derrick [name]., White REFERRING PROVIDER: [name], White  END OF SESSION:  PT End of Session - 01/21/24 1105     Visit Number 7    Number of Visits 13   9 + 4   Date for PT Re-Evaluation 03/05/24   pushed out due to delay in scheduling   Authorization Type [employer]    Progress Note Due on Visit 10    PT Start Time 1103    PT Stop Time 1141    PT Time Calculation (min) 38 min    Equipment Utilized During Treatment Gait belt    Activity Tolerance Patient tolerated treatment well    Behavior During Therapy WFL for tasks assessed/performed          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 05/04/2019   Memory loss 03/16/2019   Mild cognitive impairment 12/12/2017   Pain due to onychomycosis of toenails of both feet 04/28/2019   Pain in left ankle and joints of left foot 08/06/2018   Slurred speech 03/16/2019   Superficial peroneal nerve neuropathy, 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   TIA (transient ischemic attack) 11/05/2019   Worsened handwriting 03/16/2019   Past Surgical History:  Procedure Laterality Date   APPENDECTOMY     1940s   BACK SURGERY     1981, 1998   CARDIOVERSION N/A 09/13/2020   Procedure: CARDIOVERSION;  Surgeon: Derrick White;  Location: [facility] ENDOSCOPY;  Service: Cardiovascular;  Laterality: N/A;   FRACTURE SURGERY     Left ankle, sx x4   INGUINAL HERNIA REPAIR Right 03/05/2019   Procedure: OPEN RIGHT INGUINAL HERNIA REPAIR WITH MESH;  Surgeon: Derrick White;  Location: [facility] OR;  Service: General;  Laterality: Right;   KNEE SURGERY Left 1985   LEFT ATRIAL APPENDAGE OCCLUSION N/A 07/27/2020   Procedure: LEFT ATRIAL APPENDAGE OCCLUSION;  Surgeon: Derrick White;  Location[facility] INVASIVE CV LAB;  Service: Cardiovascular;  Laterality: N/A;   SHOULDER SURGERY Left    TEE WITHOUT CARDIOVERSION N/A 07/27/2020   Procedure: TRANSESOPHAGEAL ECHOCARDIOGRAM (TEE);  Surgeon: Derrick White;  Location: [facility] INVASIVE CV LAB;  Service: Cardiovascular;  Laterality: N/A;   TEE WITHOUT CARDIOVERSION N/A 09/13/2020   Procedure: TRANSESOPHAGEAL ECHOCARDIOGRAM (TEE);  Surgeon: Derrick White;  Location: [facility] ENDOSCOPY;  Service: Cardiovascular;  Laterality: N/A;   TONSILLECTOMY     1945   Patient Active Problem List   Diagnosis Date Noted   Pain in right shoulder 07/31/2021   Persistent atrial fibrillation (HCC) 08/28/2020   Secondary hypercoagulable state (HCC) 07/20/2020   Paroxysmal atrial fibrillation (HCC) 06/19/2020   TIA (transient ischemic attack) 11/05/2019   Essential hypertension 11/05/2019   Cerebellar ataxia (HCC) 11/05/2019   Lumbosacral radiculopathy at S1 05/04/2019   C6 radiculopathy 05/04/2019   Pain due to onychomycosis of toenails of both feet 04/28/2019   Worsened handwriting 03/16/2019   Gait disturbance 03/16/2019   Slurred speech 03/16/2019   Memory loss 03/16/2019   Pain in  left ankle and joints of left foot 08/06/2018   Mild cognitive impairment 12/12/2017   Foot pain, left 06/04/2016   Dysesthesia 06/04/2016   Superficial peroneal nerve neuropathy, left 06/04/2016   History of cervical spinal surgery 06/04/2016    ONSET DATE: several years  REFERRING DIAG:  R26.9 (ICD-10-CM) - Gait disturbance  R29.898 (ICD-10-CM) - Weakness of both lower extremities    THERAPY DIAG:  Other abnormalities of gait and mobility  Unsteadiness on feet  Other lack of coordination  Muscle weakness (generalized)  History  of falling  Ataxic gait  Rationale for Evaluation and Treatment: Rehabilitation  SUBJECTIVE:                                                                                                                                                                                             SUBJECTIVE STATEMENT:  Pt denies falls or pain.  He presents with rollator.  He reports he is still walking in the bathroom without the rollator, but using it more otherwise.  Pt accompanied by: self (he drove himself)  PERTINENT HISTORY: C6 radiculopathy, Cerebellar ataxia (HCC), Dysesthesia, Essential hypertension, Foot pain (left), Gait disturbance, History of cervical spinal surgery (06/04/2016), History of hiatal hernia, History of kidney stones, Lumbosacral radiculopathy at S1, Memory loss, Mild cognitive impairment, Pain due to onychomycosis of toenails of both feet, Pain in left ankle and joints of left foot, Slurred speech, Superficial peroneal nerve neuropathy, left (06/04/2009),TIA (transient ischemic attack) 11/05/2019. NCV/EMG study did not show any evidence of motor neuron disease. We did the [employer] SCA panel (30+ genes). It was borderline. 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 Unclear if this is playing a role (SCA type 6 has mutations in this [name] but usually triplet repeat)     PAIN:  Are you having pain? No  PRECAUTIONS: Fall  RED FLAGS: None   WEIGHT BEARING RESTRICTIONS: No  FALLS: Has patient fallen in last 6 months? Yes. Number of falls 2 - both without rollator/AD  LIVING ENVIRONMENT: Lives with: lives with their spouse Lives in: House/apartment Stairs: Yes: Internal: 12 steps; can reach both and External: 2 steps; can reach both - in back; pt reports he does not access 2nd level of home - has bedroom on lower level Has following equipment at home: [name] - 4 wheeled, walking canes  PLOF: Independent with basic ADLs, Independent with  household mobility with device, Independent with community mobility with device, and Needs assistance with homemaking  PATIENT GOALS: to get better, but no specific goal  OBJECTIVE:  Note: Objective measures were completed at  Evaluation unless otherwise noted.  DIAGNOSTIC FINDINGS: No recent relevant imaging.  COGNITION: Overall cognitive status: History of cognitive impairments - at baseline   SENSATION: WFL  COORDINATION: Ataxia of BLE  POSTURE: rounded shoulders and forward head  LOWER EXTREMITY ROM:   BLE WFL   LOWER EXTREMITY MMT (tested in seated):    MMT Right Eval Left Eval  Hip flexion 5 5  Hip extension    Hip abduction 4+ 4+  Hip adduction    Hip internal rotation    Hip external rotation    Knee flexion 5 5  Knee extension 5 5  Ankle dorsiflexion 5 5  Ankle plantarflexion    Ankle inversion    Ankle eversion    (Blank rows = not tested)  BED MOBILITY:  Modified independent - sleeps in standard bed w/o added features  TRANSFERS: Assistive device utilized: Environmental consultant - 4 wheeled  Sit to stand: SBA and cues for using brakes appropriate Stand to sit: SBA and cues for rollator brake use  GAIT: Gait pattern: ataxic Distance walked: various clinic distances ranging from 40-100' Assistive device utilized: Environmental consultant - 4 wheeled Level of assistance: CGA Comments: needs cues to maintain BUE on handles as pt distracted in open gym  FUNCTIONAL TESTS:  12/26/2023 EVAL: 5 times sit to stand: 19.34 sec no UE support w/ back of knees on chair CGA, declined edu to use UE for balance and to come completely into upright Timed up and go (TUG): 27.25 sec, significant difficulty turning to sit requiring mod cues to improve approach 10 meter walk test: 14.78 sec w/ rollator CGA w/ increased trunk lean = 0.87m/sec = 2.23 ft/sec  FROM PRIOR EVAL: 5 times sit to stand: 13.57 secs without UE support from mat Timed up and go (TUG): 24.12 secs 10 meter walk test: 18.19 secs =  1.80 ft/sec with RW                                                                                                                              TREATMENT DATE: 01/21/24     Pt reports he feels pretty good with existing HEP declining review.  5xSTS:  16.18 sec w/o UE support and mild forward trunk lean :  15.75 sec w/ rollator = 0.63 m/sec OR 2.10 ft/sec TUG:  22.78 sec w/ rollator Pt requesting SciFit:  x8 minutes in multi-peaks mode up to level 6.0 using BLE w/ intermittent BUE support for dynamic HIIT style workout and large amplitude reciprocal mobility.  PATIENT EDUCATION: Education details: Please work on HEP - can reduce frequency but importance of consistent movement. Continue going to senior center - he goes once a week.  Please use rollator at all times even in bathroom if can safely fit in space - otherwise use up to the door or closest space it will fit!  Discussed his performance will likely fluctuate and goal of paying more attention to how he is performing each  day to make safer decisions.  Ongoing POC and progress towards goals. Person educated: Patient Education method: Explanation, Demonstration, and Verbal cues Education comprehension: verbalized understanding and needs further education  HOME EXERCISE PROGRAM: Access Code: [email or web address] URL: [email or web address] Date: 01/02/2024 Prepared by: Daved Bull  Exercises - Sit to Stand Without Arm Support  - 1 x daily - 7 x weekly - 1 sets - 10 reps - Standing Hip Flexion with Counter Support  - 1 x daily - 7 x weekly - 1 sets - 10 reps - Standing Hip Extension with Counter Support  - 1 x daily - 7 x weekly - 1 sets - 10 reps - Standing Hip Abduction with Counter Support  - 1 x daily - 7 x weekly - 1 sets - 10 reps - Standing March with Counter Support  - 1 x daily - 7 x weekly - 1 sets - 10 reps - Seated Marching with Opposite Shoulder Flexion  - 1 x daily - 7 x weekly - 1 sets - 10 reps - Single Leg  Stance with Support  - 1 x daily - 7 x weekly - 1 sets - 2 reps - 10 sec hold - Step Sideways  - 1 x daily - 7 x weekly - 1 sets - 10 reps  GOALS: Goals reviewed with patient? Yes  SHORT TERM GOALS: SAME AS LTG'S   LONG TERM GOALS: Target date:  01/23/2024  Pt will be independent and compliant with strength and balance focused HEP in order to maintain functional progress and improve mobility. Baseline: IND and compliant (9/3) Goal status: MET  2.  Pt will decrease 5xSTS to </=14.34 seconds using safest method to obtain upright in order to demonstrate decreased risk for falls and improved functional bilateral LE strength and power. Baseline:  19.34 sec no UE support w/ back of knees on chair CGA; 16.18 sec w/o UE support and mild forward trunk lean (9/3) Goal status: PARTIALLY MET  3.  Pt will demonstrate a gait speed of >/=2.43 feet/sec in order to decrease risk for falls. Baseline:  2.23 ft/sec w/ rollator; 2.10 ft/sec w/ rollator (9/3) Goal status: NOT MET  4.  Pt will demonstrate TUG of </=22.25 seconds in order to decrease risk of falls and improve functional mobility using LRAD. Baseline: 27.25 sec w/ rollator; 22.78 sec w/ rollator (9/3) Goal status: PARTIALLY MET  GOALS (at 9/3 re-cert): Goals reviewed with patient? Yes  SHORT TERM GOALS: SAME AS LTG'S   LONG TERM GOALS: Target date:  02/27/2024  Pt will be independent and compliant with strength and balance focused HEP in order to maintain functional progress and improve mobility. Baseline: IND and compliant (9/3); will advance as able Goal status: ONGOING  2.  Pt will decrease 5xSTS to </=14.34 seconds using safest method to obtain upright in order to demonstrate decreased risk for falls and improved functional bilateral LE strength and power. Baseline:  19.34 sec no UE support w/ back of knees on chair CGA; 16.18 sec w/o UE support and mild forward trunk lean (9/3) Goal status: ONGOING  3.  Pt will demonstrate a  gait speed of >/=2.33 feet/sec in order to decrease risk for falls. Baseline:  2.23 ft/sec w/ rollator; 2.10 ft/sec w/ rollator (9/3) Goal status: REVISED  4.  Pt will demonstrate TUG of </=19.25 seconds in order to decrease risk of falls and improve functional mobility using LRAD. Baseline: 27.25 sec w/ rollator; 22.78 sec w/ rollator (9/3) Goal status: REVISED  ASSESSMENT:  CLINICAL IMPRESSION: Emphasis of skilled session today on assessing LTGs in preparation for recertification.  He made moderate progress on 5xSTS and almost met TUG goal as written.  His gait speed declined, but he also had more prominent ataxic movements when walking.  PT to drop frequency in plan to taper to discharge to ensure better carryover of safety education.  He remains limited by decreased insight and intermittent compliance issues with use of AD.  Continue per POC.  OBJECTIVE IMPAIRMENTS: Abnormal gait, decreased balance, decreased coordination, decreased knowledge of condition, decreased knowledge of use of DME, decreased strength, decreased safety awareness, and improper body mechanics.   ACTIVITY LIMITATIONS: carrying, lifting, bending, standing, squatting, transfers, reach over head, and locomotion level  PARTICIPATION LIMITATIONS: cleaning, laundry, shopping, community activity, and yard work  PERSONAL FACTORS: Age, Fitness, Past/current experiences, Time since onset of injury/illness/exacerbation, and 1-2 comorbidities: Cerebellar ataxia, mild cognitive impairment are also affecting patient's functional outcome.   REHAB POTENTIAL: Fair due to progressive nature of disease  CLINICAL DECISION MAKING: Evolving/moderate complexity  EVALUATION COMPLEXITY: Moderate  PLAN:  PT FREQUENCY: 2x/week + 1x/wk  PT DURATION: 4 weeks + eval + 4 wks  PLANNED INTERVENTIONS: [id number]- PT Re-evaluation, 97750- Physical Performance Testing, 97110-Therapeutic exercises, 97530- Therapeutic activity, [id number]- Neuromuscular  re-education, [phone]- Self Care, [id number]- Gait training, [phone]- Aquatic Therapy, Patient/Family education, Balance training, Stair training, Vestibular training, and DME instructions  PLAN FOR NEXT SESSION: Expand HEP prn.  Work w/ 2WW vs rollator indoor and outdoors - tight Psychiatrist.  Endurance - SciFit >/= level 6.0.  BLE strengthening.  Safety awareness - dual tasking!  Static balance, perturbations?   Daved KATHEE Bull, PT, DPT 01/21/2024, 11:39 AM

## 2024-01-30 ENCOUNTER — Encounter: Payer: Self-pay | Admitting: Physical Therapy

## 2024-01-30 ENCOUNTER — Ambulatory Visit: Admitting: Physical Therapy

## 2024-01-30 DIAGNOSIS — Z9181 History of falling: Secondary | ICD-10-CM

## 2024-01-30 DIAGNOSIS — R278 Other lack of coordination: Secondary | ICD-10-CM

## 2024-01-30 DIAGNOSIS — M6281 Muscle weakness (generalized): Secondary | ICD-10-CM

## 2024-01-30 DIAGNOSIS — R2689 Other abnormalities of gait and mobility: Secondary | ICD-10-CM | POA: Diagnosis not present

## 2024-01-30 DIAGNOSIS — R2681 Unsteadiness on feet: Secondary | ICD-10-CM

## 2024-01-30 DIAGNOSIS — R260 Ataxic gait: Secondary | ICD-10-CM

## 2024-01-30 NOTE — Therapy (Signed)
 OUTPATIENT PHYSICAL THERAPY NEURO TREATMENT   Patient Name: Derrick White MRN: [id number] DOB:[birth year], 83 y.o., male 71 Date: 01/30/2024   PCP: [name]., MD REFERRING PROVIDER: [name], MD  END OF SESSION:  PT End of Session - 01/30/24 1407     Visit Number 8    Number of Visits 13   9 + 4   Date for PT Re-Evaluation 03/05/24   pushed out due to delay in scheduling   Authorization Type [employer]    Progress Note Due on Visit 10    PT Start Time 1404    PT Stop Time 1444    PT Time Calculation (min) 40 min    Equipment Utilized During Treatment Gait belt    Activity Tolerance Patient tolerated treatment well    Behavior During Therapy [facility] for tasks assessed/performed          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 05/04/2019   Memory loss 03/16/2019   Mild cognitive impairment 12/12/2017   Pain due to onychomycosis of toenails of both feet 04/28/2019   Pain in left ankle and joints of left foot 08/06/2018   Slurred speech 03/16/2019   Superficial peroneal nerve neuropathy, 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   TIA (transient ischemic attack) 11/05/2019   Worsened handwriting 03/16/2019   Past Surgical History:  Procedure Laterality Date   APPENDECTOMY     1940s   BACK SURGERY     1981, 1998   CARDIOVERSION N/A 09/13/2020   Procedure: CARDIOVERSION;  Surgeon: [name], MD;  Location: [facility] ENDOSCOPY;  Service: Cardiovascular;  Laterality: N/A;   FRACTURE SURGERY     Left ankle, sx x4   INGUINAL HERNIA REPAIR Right 03/05/2019   Procedure: OPEN RIGHT INGUINAL HERNIA REPAIR WITH MESH;  Surgeon: [name], MD;   Location: [facility] OR;  Service: General;  Laterality: Right;   KNEE SURGERY Left 1985   LEFT ATRIAL APPENDAGE OCCLUSION N/A 07/27/2020   Procedure: LEFT ATRIAL APPENDAGE OCCLUSION;  Surgeon: [name], MD;  Location: [facility] INVASIVE CV LAB;  Service: Cardiovascular;  Laterality: N/A;   SHOULDER SURGERY Left    TEE WITHOUT CARDIOVERSION N/A 07/27/2020   Procedure: TRANSESOPHAGEAL ECHOCARDIOGRAM (TEE);  Surgeon: [name], MD;  Location: [facility] INVASIVE CV LAB;  Service: Cardiovascular;  Laterality: N/A;   TEE WITHOUT CARDIOVERSION N/A 09/13/2020   Procedure: TRANSESOPHAGEAL ECHOCARDIOGRAM (TEE);  Surgeon: [name], MD;  Location: [facility] ENDOSCOPY;  Service: Cardiovascular;  Laterality: N/A;   TONSILLECTOMY     1945   Patient Active Problem List   Diagnosis Date Noted   Pain in right shoulder 07/31/2021   Persistent atrial fibrillation (HCC) 08/28/2020   Secondary hypercoagulable state (HCC) 07/20/2020   Paroxysmal atrial fibrillation (HCC) 06/19/2020   TIA (transient ischemic attack) 11/05/2019   Essential hypertension 11/05/2019   Cerebellar ataxia (HCC) 11/05/2019   Lumbosacral radiculopathy at S1 05/04/2019   C6 radiculopathy 05/04/2019   Pain due to onychomycosis of toenails of both feet 04/28/2019   Worsened handwriting 03/16/2019   Gait disturbance 03/16/2019   Slurred speech 03/16/2019   Memory loss 03/16/2019   Pain in left ankle  and joints of left foot 08/06/2018   Mild cognitive impairment 12/12/2017   Foot pain, left 06/04/2016   Dysesthesia 06/04/2016   Superficial peroneal nerve neuropathy, 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   History of cervical spinal surgery 06/04/2016    ONSET DATE: several years  REFERRING DIAG:  R26.9 (ICD-10-CM) - Gait disturbance  R29.898 (ICD-10-CM) - Weakness of both lower extremities    THERAPY DIAG:  Other abnormalities of gait and mobility  Unsteadiness on feet  Other lack of coordination  Muscle weakness (generalized)  History of  falling  Ataxic gait  Rationale for Evaluation and Treatment: Rehabilitation  SUBJECTIVE:                                                                                                                                                                                             SUBJECTIVE STATEMENT:  Pt denies falls or pain.  He presents with rollator.    Pt accompanied by: self (he drove himself)  PERTINENT HISTORY: C6 radiculopathy, Cerebellar ataxia (HCC), Dysesthesia, Essential hypertension, Foot pain (left), Gait disturbance, History of cervical spinal surgery (06/04/2016), History of hiatal hernia, History of kidney stones, Lumbosacral radiculopathy at S1, Memory loss, Mild cognitive impairment, Pain due to onychomycosis of toenails of both feet, Pain in left ankle and joints of left foot, Slurred speech, Superficial peroneal nerve neuropathy, left (06/04/2009),TIA (transient ischemic attack) 11/05/2019. NCV/EMG study did not show any evidence of motor neuron disease. We did the Athena SCA panel (30+ genes). It was borderline. 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 Unclear if this is playing a role (SCA type 6 has mutations in this [place] but usually triplet repeat)     PAIN:  Are you having pain? No  PRECAUTIONS: Fall  RED FLAGS: None   WEIGHT BEARING RESTRICTIONS: No  FALLS: Has patient fallen in last 6 months? Yes. Number of falls 2 - both without rollator/AD  LIVING ENVIRONMENT: Lives with: lives with their spouse Lives in: House/apartment Stairs: Yes: Internal: 12 steps; can reach both and External: 2 steps; can reach both - in back; pt reports he does not access 2nd level of home - has bedroom on lower level Has following equipment at home: [name] - 4 wheeled, walking canes  PLOF: Independent with basic ADLs, Independent with household mobility with device, Independent with community mobility with device, and Needs assistance  with homemaking  PATIENT GOALS: to get better, but no specific goal  OBJECTIVE:  Note: Objective measures were completed at Evaluation unless otherwise noted.  DIAGNOSTIC FINDINGS: No recent relevant imaging.  COGNITION: Overall cognitive status: History of  cognitive impairments - at baseline   SENSATION: WFL  COORDINATION: Ataxia of BLE  POSTURE: rounded shoulders and forward head  LOWER EXTREMITY ROM:   BLE WFL   LOWER EXTREMITY MMT (tested in seated):    MMT Right Eval Left Eval  Hip flexion 5 5  Hip extension    Hip abduction 4+ 4+  Hip adduction    Hip internal rotation    Hip external rotation    Knee flexion 5 5  Knee extension 5 5  Ankle dorsiflexion 5 5  Ankle plantarflexion    Ankle inversion    Ankle eversion    (Blank rows = not tested)  BED MOBILITY:  Modified independent - sleeps in standard bed w/o added features  TRANSFERS: Assistive device utilized: Environmental consultant - 4 wheeled  Sit to stand: SBA and cues for using brakes appropriate Stand to sit: SBA and cues for rollator brake use  GAIT: Gait pattern: ataxic Distance walked: various clinic distances ranging from 40-100' Assistive device utilized: Environmental consultant - 4 wheeled Level of assistance: CGA Comments: needs cues to maintain BUE on handles as pt distracted in open gym  FUNCTIONAL TESTS:  12/26/2023 EVAL: 5 times sit to stand: 19.34 sec no UE support w/ back of knees on chair CGA, declined edu to use UE for balance and to come completely into upright Timed up and go (TUG): 27.25 sec, significant difficulty turning to sit requiring mod cues to improve approach 10 meter walk test: 14.78 sec w/ rollator CGA w/ increased trunk lean = 0.13m/sec = 2.23 ft/sec  FROM PRIOR EVAL: 5 times sit to stand: 13.57 secs without UE support from mat Timed up and go (TUG): 24.12 secs 10 meter walk test: 18.19 secs = 1.80 ft/sec with RW                                                                                                                               TREATMENT DATE: 01/30/24  GAIT: Gait pattern: step through pattern, decreased hip/knee flexion- Right, decreased hip/knee flexion- Left, decreased ankle dorsiflexion- Right, decreased ankle dorsiflexion- Left, scissoring, ataxic, trunk flexed, narrow BOS, poor foot clearance- Right, and poor foot clearance- Left Distance walked: 500 ft Assistive device utilized: Walker - 4 wheeled Level of assistance: Modified independence, SBA, and CGA Comments: Played simon says on unlevel sidewalk and grass having pt practice turns, slopes going forward and backwards, grassy hills, stop and go using brakes and thresholds.   Corner airex normal BOS unsupported SBA-CGA:  manual perturbations multi-directional x3 minutes > marching in place x1 minute working into increased ROM > 3.3lb bimanual chest press x10 > 3.3lb wood chops x10 each side w/ return demo and multi-modal cuing for form 10lb kettlebell RDL w/ significant multi-modal cuing to correct form over 15 reps STS w/ bimanual 10lb front raise 2x6  PATIENT EDUCATION: Education details: Please work on HEP - can reduce frequency but importance of consistent movement. [facility].  Please use rollator at all times even in bathroom if can safely fit in space - otherwise use up to the door or closest space it will fit!   Person educated: Patient Education method: Explanation, Demonstration, and Verbal cues Education comprehension: verbalized understanding and needs further education  HOME EXERCISE PROGRAM: Access Code: [email or web address] URL: [email or web address] Date: 01/02/2024 Prepared by: Daved Bull  Exercises - Sit to Stand Without Arm Support  - 1 x daily - 7 x weekly - 1 sets - 10 reps - Standing Hip Flexion with Counter Support  - 1 x daily - 7 x weekly - 1 sets - 10 reps - Standing Hip Extension with Counter Support  - 1 x daily - 7 x weekly - 1 sets  - 10 reps - Standing Hip Abduction with Counter Support  - 1 x daily - 7 x weekly - 1 sets - 10 reps - Standing March with Counter Support  - 1 x daily - 7 x weekly - 1 sets - 10 reps - Seated Marching with Opposite Shoulder Flexion  - 1 x daily - 7 x weekly - 1 sets - 10 reps - Single Leg Stance with Support  - 1 x daily - 7 x weekly - 1 sets - 2 reps - 10 sec hold - Step Sideways  - 1 x daily - 7 x weekly - 1 sets - 10 reps  GOALS: Goals reviewed with patient? Yes  SHORT TERM GOALS: SAME AS LTG'S   LONG TERM GOALS: Target date:  01/23/2024  Pt will be independent and compliant with strength and balance focused HEP in order to maintain functional progress and improve mobility. Baseline: IND and compliant (9/3) Goal status: MET  2.  Pt will decrease 5xSTS to </=14.34 seconds using safest method to obtain upright in order to demonstrate decreased risk for falls and improved functional bilateral LE strength and power. Baseline:  19.34 sec no UE support w/ back of knees on chair CGA; 16.18 sec w/o UE support and mild forward trunk lean (9/3) Goal status: PARTIALLY MET  3.  Pt will demonstrate a gait speed of >/=2.43 feet/sec in order to decrease risk for falls. Baseline:  2.23 ft/sec w/ rollator; 2.10 ft/sec w/ rollator (9/3) Goal status: NOT MET  4.  Pt will demonstrate TUG of </=22.25 seconds in order to decrease risk of falls and improve functional mobility using LRAD. Baseline: 27.25 sec w/ rollator; 22.78 sec w/ rollator (9/3) Goal status: PARTIALLY MET  GOALS (at 9/3 re-cert): Goals reviewed with patient? Yes  SHORT TERM GOALS: SAME AS LTG'S   LONG TERM GOALS: Target date:  02/27/2024  Pt will be independent and compliant with strength and balance focused HEP in order to maintain functional progress and improve mobility. Baseline: IND and compliant (9/3); will advance as able Goal status: ONGOING  2.  Pt will decrease 5xSTS to </=14.34 seconds using safest method to obtain  upright in order to demonstrate decreased risk for falls and improved functional bilateral LE strength and power. Baseline:  19.34 sec no UE support w/ back of knees on chair CGA; 16.18 sec w/o UE support and mild forward trunk lean (9/3) Goal status: ONGOING  3.  Pt will demonstrate a gait speed of >/=2.33 feet/sec in order to decrease risk for falls. Baseline:  2.23 ft/sec w/ rollator; 2.10 ft/sec w/ rollator (9/3) Goal status: REVISED  4.  Pt will demonstrate TUG of </=19.25 seconds in order  to decrease risk of falls and improve functional mobility using LRAD. 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  Pt continues to remove hand intermittently without engaging breaks w/o cues despite safety education.  He continues to benefit from skilled PT to improve upright mobility and safety with ambulation.  PT will further work on dual tasking and dynamic stability in future sessions as pt demonstrates some progress in BLE strength and functional mobility.  Continue per POC.  OBJECTIVE IMPAIRMENTS: Abnormal gait, decreased balance, decreased coordination, decreased knowledge of condition, decreased knowledge of use of DME, decreased strength, decreased safety awareness, and improper body mechanics.   ACTIVITY LIMITATIONS: carrying, lifting, bending, standing, squatting, transfers, reach over head, and locomotion level  PARTICIPATION LIMITATIONS: cleaning, laundry, shopping, community activity, and yard work  PERSONAL FACTORS: Age, Fitness, Past/current experiences, Time since onset of injury/illness/exacerbation, and 1-2 comorbidities: Cerebellar ataxia, mild cognitive impairment are also affecting patient's functional outcome.   REHAB POTENTIAL: Fair due to progressive nature of disease  CLINICAL DECISION MAKING: Evolving/moderate complexity  EVALUATION  COMPLEXITY: Moderate  PLAN:  PT FREQUENCY: 2x/week + 1x/wk  PT DURATION: 4 weeks + eval + 4 wks  PLANNED INTERVENTIONS: [id number]- PT Re-evaluation, 97750- Physical Performance Testing, 97110-Therapeutic exercises, 97530- Therapeutic activity, [id number]- Neuromuscular re-education, [phone]- Self Care, [id number]- Gait training, [phone]- Aquatic Therapy, Patient/Family education, Balance training, Stair training, Vestibular training, and DME instructions  PLAN FOR NEXT SESSION: Expand HEP prn.  Work w/ 2WW vs rollator indoor and outdoors - tight Psychiatrist.  Endurance - SciFit >/= level 6.0.  BLE strengthening.  Safety awareness - dual tasking!  Static balance, resisted gait w/ rollator, ankle wts for standing balance tasks - hurdles, therastones, rebounder?   Daved KATHEE Bull, PT, DPT 01/30/2024, 2:48 PM

## 2024-02-04 ENCOUNTER — Encounter: Payer: Self-pay | Admitting: Physical Therapy

## 2024-02-04 ENCOUNTER — Ambulatory Visit: Admitting: Physical Therapy

## 2024-02-04 DIAGNOSIS — M6281 Muscle weakness (generalized): Secondary | ICD-10-CM

## 2024-02-04 DIAGNOSIS — R260 Ataxic gait: Secondary | ICD-10-CM

## 2024-02-04 DIAGNOSIS — R2689 Other abnormalities of gait and mobility: Secondary | ICD-10-CM

## 2024-02-04 DIAGNOSIS — R278 Other lack of coordination: Secondary | ICD-10-CM

## 2024-02-04 DIAGNOSIS — R2681 Unsteadiness on feet: Secondary | ICD-10-CM

## 2024-02-04 DIAGNOSIS — Z9181 History of falling: Secondary | ICD-10-CM

## 2024-02-04 NOTE — Therapy (Signed)
 OUTPATIENT PHYSICAL THERAPY NEURO TREATMENT   Patient Name: Derrick White MRN: [id number] DOB:Dec 13, 1940, 83 y.o., male 80 Date: 02/04/2024   PCP: [name]., MD REFERRING PROVIDER: [name], MD  END OF SESSION:  PT End of Session - 02/04/24 1454     Visit Number 9    Number of Visits 13   9 + 4   Date for PT Re-Evaluation 03/05/24   pushed out due to delay in scheduling   Authorization Type [employer]    Progress Note Due on Visit 10    PT Start Time 1451    PT Stop Time 1529    PT Time Calculation (min) 38 min    Equipment Utilized During Treatment Gait belt    Activity Tolerance Patient tolerated treatment well    Behavior During Therapy WFL for tasks assessed/performed          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 05/04/2019   Memory loss 03/16/2019   Mild cognitive impairment 12/12/2017   Pain due to onychomycosis of toenails of both feet 04/28/2019   Pain in left ankle and joints of left foot 08/06/2018   Slurred speech 03/16/2019   Superficial peroneal nerve neuropathy, 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   TIA (transient ischemic attack) 11/05/2019   Worsened handwriting 03/16/2019   Past Surgical History:  Procedure Laterality Date   APPENDECTOMY     1940s   BACK SURGERY     1981, 1998   CARDIOVERSION N/A 09/13/2020   Procedure: CARDIOVERSION;  Surgeon: [name], MD;  Location: [facility] ENDOSCOPY;  Service: Cardiovascular;  Laterality: N/A;   FRACTURE SURGERY     Left ankle, sx x4   INGUINAL HERNIA REPAIR Right 03/05/2019   Procedure: OPEN RIGHT INGUINAL HERNIA REPAIR WITH MESH;  Surgeon: [name], MD;   Location: [facility] OR;  Service: General;  Laterality: Right;   KNEE SURGERY Left 1985   LEFT ATRIAL APPENDAGE OCCLUSION N/A 07/27/2020   Procedure: LEFT ATRIAL APPENDAGE OCCLUSION;  Surgeon: [name], MD;  Location: [facility] INVASIVE CV LAB;  Service: Cardiovascular;  Laterality: N/A;   SHOULDER SURGERY Left    TEE WITHOUT CARDIOVERSION N/A 07/27/2020   Procedure: TRANSESOPHAGEAL ECHOCARDIOGRAM (TEE);  Surgeon: [name], MD;  Location: [facility] INVASIVE CV LAB;  Service: Cardiovascular;  Laterality: N/A;   TEE WITHOUT CARDIOVERSION N/A 09/13/2020   Procedure: TRANSESOPHAGEAL ECHOCARDIOGRAM (TEE);  Surgeon: [name], MD;  Location: [facility] ENDOSCOPY;  Service: Cardiovascular;  Laterality: N/A;   TONSILLECTOMY     1945   Patient Active Problem List   Diagnosis Date Noted   Pain in right shoulder 07/31/2021   Persistent atrial fibrillation (HCC) 08/28/2020   Secondary hypercoagulable state (HCC) 07/20/2020   Paroxysmal atrial fibrillation (HCC) 06/19/2020   TIA (transient ischemic attack) 11/05/2019   Essential hypertension 11/05/2019   Cerebellar ataxia (HCC) 11/05/2019   Lumbosacral radiculopathy at S1 05/04/2019   C6 radiculopathy 05/04/2019   Pain due to onychomycosis of toenails of both feet 04/28/2019   Worsened handwriting 03/16/2019   Gait disturbance 03/16/2019   Slurred speech 03/16/2019   Memory loss 03/16/2019   Pain in left ankle  and joints of left foot 08/06/2018   Mild cognitive impairment 12/12/2017   Foot pain, left 06/04/2016   Dysesthesia 06/04/2016   Superficial peroneal nerve neuropathy, left 06/04/2016   History of cervical spinal surgery 06/04/2016    ONSET DATE: several years  REFERRING DIAG:  R26.9 (ICD-10-CM) - Gait disturbance  R29.898 (ICD-10-CM) - Weakness of both lower extremities    THERAPY DIAG:  Other abnormalities of gait and mobility  Unsteadiness on feet  Other lack of coordination  Muscle weakness (generalized)  History of  falling  Ataxic gait  Rationale for Evaluation and Treatment: Rehabilitation  SUBJECTIVE:                                                                                                                                                                                             SUBJECTIVE STATEMENT:  Pt denies falls or pain.  He presents with rollator.  He reports he is trying to use his rollator in his home more, but cannot use it in his bathroom.  Pt accompanied by: self (he drove himself)  PERTINENT HISTORY: C6 radiculopathy, Cerebellar ataxia (HCC), Dysesthesia, Essential hypertension, Foot pain (left), Gait disturbance, History of cervical spinal surgery (06/04/2016), History of hiatal hernia, History of kidney stones, Lumbosacral radiculopathy at S1, Memory loss, Mild cognitive impairment, Pain due to onychomycosis of toenails of both feet, Pain in left ankle and joints of left foot, Slurred speech, Superficial peroneal nerve neuropathy, left (06/04/2009),TIA (transient ischemic attack) 11/05/2019. NCV/EMG study did not show any evidence of motor neuron disease. We did the [employer] SCA panel (30+ genes). It was borderline. 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 Unclear if this is playing a role (SCA type 6 has mutations in this [place] but usually triplet repeat)     PAIN:  Are you having pain? No  PRECAUTIONS: Fall  RED FLAGS: None   WEIGHT BEARING RESTRICTIONS: No  FALLS: Has patient fallen in last 6 months? Yes. Number of falls 2 - both without rollator/AD  LIVING ENVIRONMENT: Lives with: lives with their spouse Lives in: House/apartment Stairs: Yes: Internal: 12 steps; can reach both and External: 2 steps; can reach both - in back; pt reports he does not access 2nd level of home - has bedroom on lower level Has following equipment at home: [name] - 4 wheeled, walking canes  PLOF: Independent with basic ADLs, Independent with  household mobility with device, Independent with community mobility with device, and Needs assistance with homemaking  PATIENT GOALS: to get better, but no specific goal  OBJECTIVE:  Note: Objective measures were completed  at Evaluation unless otherwise noted.  DIAGNOSTIC FINDINGS: No recent relevant imaging.  COGNITION: Overall cognitive status: History of cognitive impairments - at baseline   SENSATION: WFL  COORDINATION: Ataxia of BLE  POSTURE: rounded shoulders and forward head  LOWER EXTREMITY ROM:   BLE WFL   LOWER EXTREMITY MMT (tested in seated):    MMT Right Eval Left Eval  Hip flexion 5 5  Hip extension    Hip abduction 4+ 4+  Hip adduction    Hip internal rotation    Hip external rotation    Knee flexion 5 5  Knee extension 5 5  Ankle dorsiflexion 5 5  Ankle plantarflexion    Ankle inversion    Ankle eversion    (Blank rows = not tested)  BED MOBILITY:  Modified independent - sleeps in standard bed w/o added features  TRANSFERS: Assistive device utilized: Environmental consultant - 4 wheeled  Sit to stand: SBA and cues for using brakes appropriate Stand to sit: SBA and cues for rollator brake use  GAIT: Gait pattern: ataxic Distance walked: various clinic distances ranging from 40-100' Assistive device utilized: Environmental consultant - 4 wheeled Level of assistance: CGA Comments: needs cues to maintain BUE on handles as pt distracted in open gym  FUNCTIONAL TESTS:  12/26/2023 EVAL: 5 times sit to stand: 19.34 sec no UE support w/ back of knees on chair CGA, declined edu to use UE for balance and to come completely into upright Timed up and go (TUG): 27.25 sec, significant difficulty turning to sit requiring mod cues to improve approach 10 meter walk test: 14.78 sec w/ rollator CGA w/ increased trunk lean = 0.71m/sec = 2.23 ft/sec  FROM PRIOR EVAL: 5 times sit to stand: 13.57 secs without UE support from mat Timed up and go (TUG): 24.12 secs 10 meter walk test: 18.19 secs =  1.80 ft/sec with RW                                                                                                                              TREATMENT DATE: 02/04/24  SciFit in sprints mode at level 6.0 x 8 minutes using BUE/BLE support for dynamic HIIT style workout and large amplitude reciprocal mobility Front row w/ bosu surge 2x10 > front raise x12 w/ PT providing minA to obtain full UE extension Step outs w/ ipsilateral 3lb serve part-whole x12 each side > repeated w/ contralateral serve STS w/ 6lb ball push to wedge target x15 SBA-CGA working into no LE against mat table  PATIENT EDUCATION: Education details: Please work on [employer] - can reduce frequency but importance of consistent movement. Continue going to senior center - he goes once a week.  Please use rollator at all times even in bathroom if can safely fit in space - otherwise use up to the door or closest space it will fit!   Person educated: Patient Education method: Explanation, Demonstration, and Verbal cues Education comprehension: verbalized understanding and needs  further education  HOME EXERCISE PROGRAM: Access Code: [email or web address] URL: [email or web address] Date: 01/02/2024 Prepared by: Daved Bull  Exercises - Sit to Stand Without Arm Support  - 1 x daily - 7 x weekly - 1 sets - 10 reps - Standing Hip Flexion with Counter Support  - 1 x daily - 7 x weekly - 1 sets - 10 reps - Standing Hip Extension with Counter Support  - 1 x daily - 7 x weekly - 1 sets - 10 reps - Standing Hip Abduction with Counter Support  - 1 x daily - 7 x weekly - 1 sets - 10 reps - Standing March with Counter Support  - 1 x daily - 7 x weekly - 1 sets - 10 reps - Seated Marching with Opposite Shoulder Flexion  - 1 x daily - 7 x weekly - 1 sets - 10 reps - Single Leg Stance with Support  - 1 x daily - 7 x weekly - 1 sets - 2 reps - 10 sec hold - Step Sideways  - 1 x daily - 7 x weekly - 1 sets - 10 reps  GOALS: Goals reviewed  with patient? Yes  SHORT TERM GOALS: SAME AS LTG'S   LONG TERM GOALS: Target date:  01/23/2024  Pt will be independent and compliant with strength and balance focused HEP in order to maintain functional progress and improve mobility. Baseline: IND and compliant (9/3) Goal status: MET  2.  Pt will decrease 5xSTS to </=14.34 seconds using safest method to obtain upright in order to demonstrate decreased risk for falls and improved functional bilateral LE strength and power. Baseline:  19.34 sec no UE support w/ back of knees on chair CGA; 16.18 sec w/o UE support and mild forward trunk lean (9/3) Goal status: PARTIALLY MET  3.  Pt will demonstrate a gait speed of >/=2.43 feet/sec in order to decrease risk for falls. Baseline:  2.23 ft/sec w/ rollator; 2.10 ft/sec w/ rollator (9/3) Goal status: NOT MET  4.  Pt will demonstrate TUG of </=22.25 seconds in order to decrease risk of falls and improve functional mobility using LRAD. Baseline: 27.25 sec w/ rollator; 22.78 sec w/ rollator (9/3) Goal status: PARTIALLY MET  GOALS (at 9/3 re-cert): Goals reviewed with patient? Yes  SHORT TERM GOALS: SAME AS LTG'S   LONG TERM GOALS: Target date:  02/27/2024  Pt will be independent and compliant with strength and balance focused HEP in order to maintain functional progress and improve mobility. Baseline: IND and compliant (9/3); will advance as able Goal status: ONGOING  2.  Pt will decrease 5xSTS to </=14.34 seconds using safest method to obtain upright in order to demonstrate decreased risk for falls and improved functional bilateral LE strength and power. Baseline:  19.34 sec no UE support w/ back of knees on chair CGA; 16.18 sec w/o UE support and mild forward trunk lean (9/3) Goal status: ONGOING  3.  Pt will demonstrate a gait speed of >/=2.33 feet/sec in order to decrease risk for falls. Baseline:  2.23 ft/sec w/ rollator; 2.10 ft/sec w/ rollator (9/3) Goal status: REVISED  4.  Pt  will demonstrate TUG of </=19.25 seconds in order to decrease risk of falls and improve functional mobility using LRAD. Baseline: 27.25 sec w/ rollator; 22.78 sec w/ rollator (9/3) Goal status: REVISED  ASSESSMENT:  CLINICAL IMPRESSION: Focus of skilled session on improving anticipatory and reactive balance strategies.  He continues to be challenged by higher level coordination demands, but is able to  improve with consistent repetition.  PT to further address safety w/ rollator, dynamic stability, and gait mechanics as able.    Continue per POC.  OBJECTIVE IMPAIRMENTS: Abnormal gait, decreased balance, decreased coordination, decreased knowledge of condition, decreased knowledge of use of DME, decreased strength, decreased safety awareness, and improper body mechanics.   ACTIVITY LIMITATIONS: carrying, lifting, bending, standing, squatting, transfers, reach over head, and locomotion level  PARTICIPATION LIMITATIONS: cleaning, laundry, shopping, community activity, and yard work  PERSONAL FACTORS: Age, Fitness, Past/current experiences, Time since onset of injury/illness/exacerbation, and 1-2 comorbidities: Cerebellar ataxia, mild cognitive impairment are also affecting patient's functional outcome.   REHAB POTENTIAL: Fair due to progressive nature of disease  CLINICAL DECISION MAKING: Evolving/moderate complexity  EVALUATION COMPLEXITY: Moderate  PLAN:  PT FREQUENCY: 2x/week + 1x/wk  PT DURATION: 4 weeks + eval + 4 wks  PLANNED INTERVENTIONS: [id number]- PT Re-evaluation, 97750- Physical Performance Testing, 97110-Therapeutic exercises, 97530- Therapeutic activity, [id number]- Neuromuscular re-education, [phone]- Self Care, [id number]- Gait training, [phone]- Aquatic Therapy, Patient/Family education, Balance training, Stair training, Vestibular training, and DME instructions  PLAN FOR NEXT SESSION: Expand HEP prn.  Work w/ 2WW vs rollator indoor and outdoors - tight Psychiatrist.   Endurance - SciFit >/= level 6.0.  BLE strengthening.  Safety awareness - dual tasking!  Static balance, resisted gait w/ rollator, ankle wts for standing balance tasks - hurdles, therastones, rebounder?   Daved KATHEE Bull, PT, DPT 02/04/2024, 3:30 PM

## 2024-02-10 ENCOUNTER — Encounter: Payer: Self-pay | Admitting: Physical Therapy

## 2024-02-10 ENCOUNTER — Ambulatory Visit: Admitting: Physical Therapy

## 2024-02-10 DIAGNOSIS — R2689 Other abnormalities of gait and mobility: Secondary | ICD-10-CM | POA: Diagnosis not present

## 2024-02-10 DIAGNOSIS — R2681 Unsteadiness on feet: Secondary | ICD-10-CM

## 2024-02-10 DIAGNOSIS — M6281 Muscle weakness (generalized): Secondary | ICD-10-CM

## 2024-02-10 DIAGNOSIS — R260 Ataxic gait: Secondary | ICD-10-CM

## 2024-02-10 DIAGNOSIS — Z9181 History of falling: Secondary | ICD-10-CM

## 2024-02-10 DIAGNOSIS — R278 Other lack of coordination: Secondary | ICD-10-CM

## 2024-02-10 NOTE — Therapy (Signed)
 OUTPATIENT PHYSICAL THERAPY NEURO TREATMENT - 10TH VISIT PN   Patient Name: Derrick White MRN: [id number] DOB:1940-07-14, 83 y.o., male Today's Date: 02/10/2024   PCP: Derrick [name]., White REFERRING PROVIDER: [name], White  PT progress note for Derrick White.  Reporting period 12/26/2023 to 02/10/2024  See Note below for Objective Data and Assessment of Progress/Goals  Thank you for the referral of this patient. Derrick White, PT, DPT  END OF SESSION:  PT End of Session - 02/10/24 1110     Visit Number 10    Number of Visits 13   9 + 4   Date for Recertification  03/05/24   pushed out due to delay in scheduling   Authorization Type [employer]    Progress Note Due on Visit 10    PT Start Time 1102    PT Stop Time 1149    PT Time Calculation (min) 47 min    Equipment Utilized During Treatment Gait belt    Activity Tolerance Patient tolerated treatment well    Behavior During Therapy WFL for tasks assessed/performed          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 05/04/2019   Memory loss 03/16/2019   Mild cognitive impairment 12/12/2017   Pain due to onychomycosis of toenails of both feet 04/28/2019   Pain in left ankle and joints of left foot 08/06/2018   Slurred speech 03/16/2019   Superficial peroneal nerve neuropathy, 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   TIA (transient ischemic attack) 11/05/2019   Worsened handwriting 03/16/2019   Past Surgical History:  Procedure Laterality Date   APPENDECTOMY     1940s   BACK SURGERY     1981, 1998   CARDIOVERSION N/A 09/13/2020   Procedure: CARDIOVERSION;  Surgeon: Derrick White;   Location: [facility] ENDOSCOPY;  Service: Cardiovascular;  Laterality: N/A;   FRACTURE SURGERY     Left ankle, sx x4   INGUINAL HERNIA REPAIR Right 03/05/2019   Procedure: OPEN RIGHT INGUINAL HERNIA REPAIR WITH MESH;  Surgeon: Derrick White;  Location: [facility] OR;  Service: General;  Laterality: Right;   KNEE SURGERY Left 1985   LEFT ATRIAL APPENDAGE OCCLUSION N/A 07/27/2020   Procedure: LEFT ATRIAL APPENDAGE OCCLUSION;  Surgeon: Derrick White;  Location: [facility] INVASIVE CV LAB;  Service: Cardiovascular;  Laterality: N/A;   SHOULDER SURGERY Left    TEE WITHOUT CARDIOVERSION N/A 07/27/2020   Procedure: TRANSESOPHAGEAL ECHOCARDIOGRAM (TEE);  Surgeon: Derrick White;  Location: [facility] INVASIVE CV LAB;  Service: Cardiovascular;  Laterality: N/A;   TEE WITHOUT CARDIOVERSION N/A 09/13/2020   Procedure: TRANSESOPHAGEAL ECHOCARDIOGRAM (TEE);  Surgeon: Derrick White;  Location: [facility] ENDOSCOPY;  Service: Cardiovascular;  Laterality: N/A;   TONSILLECTOMY     1945   Patient Active Problem List   Diagnosis Date Noted   Pain in right shoulder 07/31/2021   Persistent atrial fibrillation (HCC) 08/28/2020   Secondary hypercoagulable state 07/20/2020   Paroxysmal atrial fibrillation (HCC) 06/19/2020   TIA (transient ischemic attack) 11/05/2019   Essential hypertension 11/05/2019   Cerebellar ataxia (HCC) 11/05/2019   Lumbosacral radiculopathy at S1 05/04/2019   C6  radiculopathy 05/04/2019   Pain due to onychomycosis of toenails of both feet 04/28/2019   Worsened handwriting 03/16/2019   Gait disturbance 03/16/2019   Slurred speech 03/16/2019   Memory loss 03/16/2019   Pain in left ankle and joints of left foot 08/06/2018   Mild cognitive impairment 12/12/2017   Foot pain, left 06/04/2016   Dysesthesia 06/04/2016   Superficial peroneal nerve neuropathy, 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   History of cervical spinal surgery 06/04/2016    ONSET DATE: several years  REFERRING DIAG:  R26.9 (ICD-10-CM) - Gait  disturbance  R29.898 (ICD-10-CM) - Weakness of both lower extremities    THERAPY DIAG:  Other abnormalities of gait and mobility  Unsteadiness on feet  Other lack of coordination  Muscle weakness (generalized)  History of falling  Ataxic gait  Rationale for Evaluation and Treatment: Rehabilitation  SUBJECTIVE:                                                                                                                                                                                             SUBJECTIVE STATEMENT:  Pt denies falls or pain.  He presents with rollator.  He states he is doing well today.  Pt accompanied by: self (he drove himself)  PERTINENT HISTORY: C6 radiculopathy, Cerebellar ataxia (HCC), Dysesthesia, Essential hypertension, Foot pain (left), Gait disturbance, History of cervical spinal surgery (06/04/2016), History of hiatal hernia, History of kidney stones, Lumbosacral radiculopathy at S1, Memory loss, Mild cognitive impairment, Pain due to onychomycosis of toenails of both feet, Pain in left ankle and joints of left foot, Slurred speech, Superficial peroneal nerve neuropathy, left (06/04/2009),TIA (transient ischemic attack) 11/05/2019. NCV/EMG study did not show any evidence of motor neuron disease. We did the [employer] SCA panel (30+ genes). It was borderline. 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 No  PRECAUTIONS: Fall  RED FLAGS: None   WEIGHT BEARING RESTRICTIONS: No  FALLS: Has patient fallen in last 6 months? Yes. Number of falls 2 - both without rollator/AD  LIVING ENVIRONMENT: Lives with: lives with their spouse Lives in: House/apartment Stairs: Yes: Internal: 12 steps; can reach both and External: 2 steps; can reach both - in back; pt reports he does not access 2nd  level of home - has bedroom on lower level Has following equipment at home: Derrick White, walking canes  PLOF: Independent with basic ADLs, Independent with household mobility with device, Independent with  community mobility with device, and Needs assistance with homemaking  PATIENT GOALS: to get better, but no specific goal  OBJECTIVE:  Note: Objective measures were completed at Evaluation unless otherwise noted.  DIAGNOSTIC FINDINGS: No recent relevant imaging.  COGNITION: Overall cognitive status: History of cognitive impairments - at baseline   SENSATION: WFL  COORDINATION: Ataxia of BLE  POSTURE: rounded shoulders and forward head  LOWER EXTREMITY ROM:   BLE WFL   LOWER EXTREMITY MMT (tested in seated):    MMT Right Eval Left Eval  Hip flexion 5 5  Hip extension    Hip abduction 4+ 4+  Hip adduction    Hip internal rotation    Hip external rotation    Knee flexion 5 5  Knee extension 5 5  Ankle dorsiflexion 5 5  Ankle plantarflexion    Ankle inversion    Ankle eversion    (Blank rows = not tested)  BED MOBILITY:  Modified independent - sleeps in standard bed w/o added features  TRANSFERS: Assistive device utilized: Environmental consultant - 4 White  Sit to stand: SBA and cues for using brakes appropriate Stand to sit: SBA and cues for rollator brake use  GAIT: Gait pattern: ataxic Distance walked: various clinic distances ranging from 40-100' Assistive device utilized: Environmental consultant - 4 White Level of assistance: CGA Comments: needs cues to maintain BUE on handles as pt distracted in open gym  FUNCTIONAL TESTS:  12/26/2023 EVAL: 5 times sit to stand: 19.34 sec no UE support w/ back of knees on chair CGA, declined edu to use UE for balance and to come completely into upright Timed up and go (TUG): 27.25 sec, significant difficulty turning to sit requiring mod cues to improve approach 10 meter walk test: 14.78 sec w/ rollator CGA w/ increased trunk lean =  0.68m/sec = 2.23 ft/sec  FROM PRIOR EVAL: 5 times sit to stand: 13.57 secs without UE support from mat Timed up and go (TUG): 24.12 secs 10 meter walk test: 18.19 secs = 1.80 ft/sec with RW                                                                                                                              TREATMENT DATE: 02/10/24  SciFit in dual peaks mode up to level 7.0 x 8 minutes using BUE/BLE support for endurance challenge and large amplitude reciprocal mobility. 5 Blaze pods on random one color taps setting for improved ankle strategy, overhead reach, and dual tasking.  Performed on 1 minute intervals with 30 sec rest periods.  Pt requires SBA guarding. Round 1:  foam beam lateral stepping w/ overhead mirror setup.  7 hits. Round 2:   setup.  9 hits. Round 3:   setup.  10 hits. Notable errors/deficits:  Uses ballet bar for upright.  Discontinuity of motor and cognitive task.  Mod to max cues to organize and strategize categories.  Naming foods > desserts > colors.  Frequent repetition. 5  Blaze pods on random one color taps setting for improved ankle strategy, overhead reach, and dual tasking.  Performed on 1 minute intervals with 30 sec rest periods.  Pt requires SBA guarding. Round 1:  foam beam lateral stepping w/ overhead mirror setup.  7 hits. Round 2:   setup.  9 hits. Round 3:   setup.  10 hits. Notable errors/deficits:  Uses ballet bar for upright.  Some improved continuity of motor and cognitive task.  Min cues to organize and strategize categories.  Explaining process to go golfing > each club purpose > scoring of golf/score terminology (old hobby).  Most difficulty explaining golf terms/scoring. Resisted walking x300 ft pt using rollator, cues for widened BOS and improved step length  PATIENT EDUCATION: Education details: Please work on HEP - can reduce frequency but importance of consistent movement. Continue going to senior center - he goes once a week.  Please  use rollator at all times even in bathroom if can safely fit in space - otherwise use up to the door or closest space it will fit!  Discussed safest way to rise from floor using pull to stand at sink and limiting amount that he gets on floor.  Possible re-cert to address safety with this next visit. Person educated: Patient Education method: Explanation, Demonstration, and Verbal cues Education comprehension: verbalized understanding and needs further education  HOME EXERCISE PROGRAM: Access Code: [email or web address] URL: [email or web address] Date: 01/02/2024 Prepared by: Derrick White  Exercises - Sit to Stand Without Arm Support  - 1 x daily - 7 x weekly - 1 sets - 10 reps - Standing Hip Flexion with Counter Support  - 1 x daily - 7 x weekly - 1 sets - 10 reps - Standing Hip Extension with Counter Support  - 1 x daily - 7 x weekly - 1 sets - 10 reps - Standing Hip Abduction with Counter Support  - 1 x daily - 7 x weekly - 1 sets - 10 reps - Standing March with Counter Support  - 1 x daily - 7 x weekly - 1 sets - 10 reps - Seated Marching with Opposite Shoulder Flexion  - 1 x daily - 7 x weekly - 1 sets - 10 reps - Single Leg Stance with Support  - 1 x daily - 7 x weekly - 1 sets - 2 reps - 10 sec hold - Step Sideways  - 1 x daily - 7 x weekly - 1 sets - 10 reps  GOALS: Goals reviewed with patient? Yes  SHORT TERM GOALS: SAME AS LTG'S   LONG TERM GOALS: Target date:  01/23/2024  Pt will be independent and compliant with strength and balance focused HEP in order to maintain functional progress and improve mobility. Baseline: IND and compliant (9/3) Goal status: MET  2.  Pt will decrease 5xSTS to </=14.34 seconds using safest method to obtain upright in order to demonstrate decreased risk for falls and improved functional bilateral LE strength and power. Baseline:  19.34 sec no UE support w/ back of knees on chair CGA; 16.18 sec w/o UE support and mild forward trunk lean  (9/3) Goal status: PARTIALLY MET  3.  Pt will demonstrate a gait speed of >/=2.43 feet/sec in order to decrease risk for falls. Baseline:  2.23 ft/sec w/ rollator; 2.10 ft/sec w/ rollator (9/3) Goal status: NOT MET  4.  Pt will demonstrate TUG of </=22.25 seconds in order to decrease risk of falls and improve functional mobility using LRAD. Baseline: 27.25  sec w/ rollator; 22.78 sec w/ rollator (9/3) Goal status: PARTIALLY MET  GOALS (at 9/3 re-cert): Goals reviewed with patient? Yes  SHORT TERM GOALS: SAME AS LTG'S   LONG TERM GOALS: Target date:  02/27/2024  Pt will be independent and compliant with strength and balance focused HEP in order to maintain functional progress and improve mobility. Baseline: IND and compliant (9/3); will advance as able Goal status: ONGOING  2.  Pt will decrease 5xSTS to </=14.34 seconds using safest method to obtain upright in order to demonstrate decreased risk for falls and improved functional bilateral LE strength and power. Baseline:  19.34 sec no UE support w/ back of knees on chair CGA; 16.18 sec w/o UE support and mild forward trunk lean (9/3) Goal status: ONGOING  3.  Pt will demonstrate a gait speed of >/=2.33 feet/sec in order to decrease risk for falls. Baseline:  2.23 ft/sec w/ rollator; 2.10 ft/sec w/ rollator (9/3) Goal status: REVISED  4.  Pt will demonstrate TUG of </=19.25 seconds in order to decrease risk of falls and improve functional mobility using LRAD. Baseline: 27.25 sec w/ rollator; 22.78 sec w/ rollator (9/3) Goal status: REVISED  ASSESSMENT:  CLINICAL IMPRESSION: Primary portion of session used to address dual tasking as pt is easily distractible which impacts his gait.  He is able to dual task when cognitive component is more story-like and a familiar concept.  He does well with resisted gait demonstrating improved gait mechanics.  He may benefit from re-cert for a few additional visits next session to improve safety  awareness and ease of getting off the floor.  This is not recommended for him to do at current, but he persists in getting onto the floor so PT to address from safety standpoint.  Continue per POC.  OBJECTIVE IMPAIRMENTS: Abnormal gait, decreased balance, decreased coordination, decreased knowledge of condition, decreased knowledge of use of DME, decreased strength, decreased safety awareness, and improper body mechanics.   ACTIVITY LIMITATIONS: carrying, lifting, bending, standing, squatting, transfers, reach over head, and locomotion level  PARTICIPATION LIMITATIONS: cleaning, laundry, shopping, community activity, and yard work  PERSONAL FACTORS: Age, Fitness, Past/current experiences, Time since onset of injury/illness/exacerbation, and 1-2 comorbidities: Cerebellar ataxia, mild cognitive impairment are also affecting patient's functional outcome.   REHAB POTENTIAL: Fair due to progressive nature of disease  CLINICAL DECISION MAKING: Evolving/moderate complexity  EVALUATION COMPLEXITY: Moderate  PLAN:  PT FREQUENCY: 2x/week + 1x/wk  PT DURATION: 4 weeks + eval + 4 wks  PLANNED INTERVENTIONS: [id number]- PT Re-evaluation, 97750- Physical Performance Testing, 97110-Therapeutic exercises, 97530- Therapeutic activity, [id number]- Neuromuscular re-education, [phone]- Self Care, [id number]- Gait training, [phone]- Aquatic Therapy, Patient/Family education, Balance training, Stair training, Vestibular training, and DME instructions  PLAN FOR NEXT SESSION: Expand HEP prn.  Work w/ 2WW vs rollator indoor and outdoors - tight Psychiatrist.  Endurance - SciFit >/= level 7.0.  BLE strengthening. Static balance, ankle wts for standing balance tasks - hurdles, therastones, rebounder?  Re-cert vs D/C?  Possibly re-cert for 4 visits to re-address below: Pt requesting to practice getting up from supine and getting off the floor as he had a difficult time getting up from under sink.  Derrick KATHEE White,  PT, DPT 02/10/2024, 1:20 PM

## 2024-02-18 ENCOUNTER — Ambulatory Visit: Admitting: Physical Therapy

## 2024-02-21 DIAGNOSIS — Z23 Encounter for immunization: Secondary | ICD-10-CM | POA: Diagnosis not present

## 2024-02-22 ENCOUNTER — Other Ambulatory Visit: Payer: Self-pay | Admitting: Internal Medicine

## 2024-02-23 ENCOUNTER — Ambulatory Visit: Attending: Neurology | Admitting: Physical Therapy

## 2024-02-23 ENCOUNTER — Encounter: Payer: Self-pay | Admitting: Physical Therapy

## 2024-02-23 ENCOUNTER — Other Ambulatory Visit: Payer: Self-pay

## 2024-02-23 DIAGNOSIS — R2681 Unsteadiness on feet: Secondary | ICD-10-CM | POA: Diagnosis not present

## 2024-02-23 DIAGNOSIS — R260 Ataxic gait: Secondary | ICD-10-CM | POA: Diagnosis not present

## 2024-02-23 DIAGNOSIS — Z9181 History of falling: Secondary | ICD-10-CM | POA: Insufficient documentation

## 2024-02-23 DIAGNOSIS — R2689 Other abnormalities of gait and mobility: Secondary | ICD-10-CM | POA: Insufficient documentation

## 2024-02-23 DIAGNOSIS — R278 Other lack of coordination: Secondary | ICD-10-CM | POA: Insufficient documentation

## 2024-02-23 DIAGNOSIS — M6281 Muscle weakness (generalized): Secondary | ICD-10-CM | POA: Diagnosis not present

## 2024-02-23 MED ORDER — AMIODARONE HCL 200 MG PO TABS: 200.0000 mg | ORAL_TABLET | Freq: Every day | ORAL | 0 refills | Status: DC

## 2024-02-23 NOTE — Therapy (Signed)
 OUTPATIENT PHYSICAL THERAPY NEURO TREATMENT - RECERT   Patient Name: Derrick White MRN: [id number] DOB:1940-11-12, 83 y.o., male 46 Date: 02/23/2024   PCP: [name]., MD REFERRING PROVIDER: [name], MD   END OF SESSION:  PT End of Session - 02/23/24 1105     Visit Number 11    Number of Visits 17   13 + 4   Date for Recertification  04/02/24   pushed out due to potential rescheduling needs of pt at re-cert 89/3   Authorization Type [employer]    Progress Note Due on Visit 10    PT Start Time 1101    PT Stop Time 1145    PT Time Calculation (min) 44 min    Equipment Utilized During Treatment Gait belt    Activity Tolerance Patient tolerated treatment well    Behavior During Therapy [facility] for tasks assessed/performed          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 05/04/2019   Memory loss 03/16/2019   Mild cognitive impairment 12/12/2017   Pain due to onychomycosis of toenails of both feet 04/28/2019   Pain in left ankle and joints of left foot 08/06/2018   Slurred speech 03/16/2019   Superficial peroneal nerve neuropathy, 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   TIA (transient ischemic attack) 11/05/2019   Worsened handwriting 03/16/2019   Past Surgical History:  Procedure Laterality Date   APPENDECTOMY     1940s   BACK SURGERY     1981, 1998   CARDIOVERSION N/A 09/13/2020   Procedure: CARDIOVERSION;  Surgeon: [name], MD;  Location: [facility] ENDOSCOPY;  Service: Cardiovascular;  Laterality: N/A;   FRACTURE SURGERY     Left ankle, sx x4   INGUINAL HERNIA REPAIR Right 03/05/2019   Procedure: OPEN RIGHT INGUINAL HERNIA REPAIR WITH  MESH;  Surgeon: [name], MD;  Location: [facility] OR;  Service: General;  Laterality: Right;   KNEE SURGERY Left 1985   LEFT ATRIAL APPENDAGE OCCLUSION N/A 07/27/2020   Procedure: LEFT ATRIAL APPENDAGE OCCLUSION;  Surgeon: [name], MD;  Location: [facility] INVASIVE CV LAB;  Service: Cardiovascular;  Laterality: N/A;   SHOULDER SURGERY Left    TEE WITHOUT CARDIOVERSION N/A 07/27/2020   Procedure: TRANSESOPHAGEAL ECHOCARDIOGRAM (TEE);  Surgeon: [name], MD;  Location: [facility] INVASIVE CV LAB;  Service: Cardiovascular;  Laterality: N/A;   TEE WITHOUT CARDIOVERSION N/A 09/13/2020   Procedure: TRANSESOPHAGEAL ECHOCARDIOGRAM (TEE);  Surgeon: [name], MD;  Location: [facility] ENDOSCOPY;  Service: Cardiovascular;  Laterality: N/A;   TONSILLECTOMY     1945   Patient Active Problem List   Diagnosis Date Noted   Pain in right shoulder 07/31/2021   Persistent atrial fibrillation (HCC) 08/28/2020   Secondary hypercoagulable state 07/20/2020   Paroxysmal atrial fibrillation (HCC) 06/19/2020   TIA (transient ischemic attack) 11/05/2019   Essential hypertension 11/05/2019   Cerebellar ataxia (HCC) 11/05/2019   Lumbosacral radiculopathy at S1 05/04/2019   C6 radiculopathy 05/04/2019   Pain due to onychomycosis of toenails of both feet 04/28/2019   Worsened handwriting 03/16/2019   Gait disturbance 03/16/2019   Slurred speech 03/16/2019   Memory loss  03/16/2019   Pain in left ankle and joints of left foot 08/06/2018   Mild cognitive impairment 12/12/2017   Foot pain, left 06/04/2016   Dysesthesia 06/04/2016   Superficial peroneal nerve neuropathy, left 06/04/2016   History of cervical spinal surgery 06/04/2016    ONSET DATE: several years  REFERRING DIAG:  R26.9 (ICD-10-CM) - Gait disturbance  R29.898 (ICD-10-CM) - Weakness of both lower extremities    THERAPY DIAG:  Other abnormalities of gait and mobility  Unsteadiness on feet  Other lack of coordination  Muscle weakness  (generalized)  History of falling  Ataxic gait  Rationale for Evaluation and Treatment: Rehabilitation  SUBJECTIVE:                                                                                                                                                                                             SUBJECTIVE STATEMENT:  Pt denies pain.  He presents with rollator.  He states he is doing well today.  He has had some close calls but no falls.  He states he has caught his foot or slightly tripped over his rollator a couple of times.  Pt accompanied by: self (he drove himself)  PERTINENT HISTORY: C6 radiculopathy, Cerebellar ataxia (HCC), Dysesthesia, Essential hypertension, Foot pain (left), Gait disturbance, History of cervical spinal surgery (06/04/2016), History of hiatal hernia, History of kidney stones, Lumbosacral radiculopathy at S1, Memory loss, Mild cognitive impairment, Pain due to onychomycosis of toenails of both feet, Pain in left ankle and joints of left foot, Slurred speech, Superficial peroneal nerve neuropathy, left (06/04/2009),TIA (transient ischemic attack) 11/05/2019. NCV/EMG study did not show any evidence of motor neuron disease. We did the [employer] SCA panel (30+ genes). It was borderline. 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 Unclear if this is playing a role (SCA type 6 has mutations in this Demetruis but usually triplet repeat)     PAIN:  Are you having pain? No  PRECAUTIONS: Fall  RED FLAGS: None   WEIGHT BEARING RESTRICTIONS: No  FALLS: Has patient fallen in last 6 months? Yes. Number of falls 2 - both without rollator/AD  LIVING ENVIRONMENT: Lives with: lives with their spouse Lives in: House/apartment Stairs: Yes: Internal: 12 steps; can reach both and External: 2 steps; can reach both - in back; pt reports he does not access 2nd level of home - has bedroom on lower level Has following equipment at home:  [name] - 4 wheeled, walking canes  PLOF: Independent with basic ADLs, Independent with household mobility with device, Independent with community mobility with device, and Needs assistance  with homemaking  PATIENT GOALS: to get better, but no specific goal  OBJECTIVE:  Note: Objective measures were completed at Evaluation unless otherwise noted.  DIAGNOSTIC FINDINGS: No recent relevant imaging.  COGNITION: Overall cognitive status: History of cognitive impairments - at baseline   SENSATION: WFL  COORDINATION: Ataxia of BLE  POSTURE: rounded shoulders and forward head  LOWER EXTREMITY ROM:   BLE WFL   LOWER EXTREMITY MMT (tested in seated):    MMT Right Eval Left Eval  Hip flexion 5 5  Hip extension    Hip abduction 4+ 4+  Hip adduction    Hip internal rotation    Hip external rotation    Knee flexion 5 5  Knee extension 5 5  Ankle dorsiflexion 5 5  Ankle plantarflexion    Ankle inversion    Ankle eversion    (Blank rows = not tested)  BED MOBILITY:  Modified independent - sleeps in standard bed w/o added features  TRANSFERS: Assistive device utilized: Environmental consultant - 4 wheeled  Sit to stand: SBA and cues for using brakes appropriate Stand to sit: SBA and cues for rollator brake use  GAIT: Gait pattern: ataxic Distance walked: various clinic distances ranging from 40-100' Assistive device utilized: Environmental consultant - 4 wheeled Level of assistance: CGA Comments: needs cues to maintain BUE on handles as pt distracted in open gym  FUNCTIONAL TESTS:  12/26/2023 EVAL: 5 times sit to stand: 19.34 sec no UE support w/ back of knees on chair CGA, declined edu to use UE for balance and to come completely into upright Timed up and go (TUG): 27.25 sec, significant difficulty turning to sit requiring mod cues to improve approach 10 meter walk test: 14.78 sec w/ rollator CGA w/ increased trunk lean = 0.81m/sec = 2.23 ft/sec  FROM PRIOR EVAL: 5 times sit to stand: 13.57 secs without  UE support from mat Timed up and go (TUG): 24.12 secs 10 meter walk test: 18.19 secs = 1.80 ft/sec with RW                                                                                                                              TREATMENT DATE: 02/23/24  Verbally reviewed HEP, endorses ongoing difficulty some days due to coordination difficulties, he is not doing standing march much, but does all others most everyday. 5xSTS: 20.06 sec no UE support, intermittent back of knees on mat surface to force anterior weight shift :  16.97 sec w/ rollator SBA = 0.59 m/sec OR 1.94 ft/sec TUG:  31.97 sec w/ rollator (Trial 1), unsafe turn and delayed start TUG:  24.69 sec w/ rollator (Trial 2), much improved start w/ hand support and safety during turns SciFit x8 minutes using BUE/BLE in progressive peak mode for intense cardiovascular challenge, global strengthening and large amplitude reciprocal mobility.  PATIENT EDUCATION: Education details: Please work on HEP - can reduce frequency but importance of consistent movement. Continue going to senior center -  he goes once a week.  Reviewed stepping into rollator to limit straddling wheel or catching foot on wheel.  Calling fire dept - try non-emergency line as pt is concerned w/ 911 charging to come help him out of floor - emphasized that regardless he cannot lay in floor for prolonged periods and wife may not be safe to help him up.  Appts added. Person educated: Patient Education method: Explanation, Demonstration, and Verbal cues Education comprehension: verbalized understanding and needs further education  HOME EXERCISE PROGRAM: Access Code: [email or web address] URL: [email or web address] Date: 01/02/2024 Prepared by: Daved Bull  Exercises - Sit to Stand Without Arm Support  - 1 x daily - 7 x weekly - 1 sets - 10 reps - Standing Hip Flexion with Counter Support  - 1 x daily - 7 x weekly - 1 sets - 10 reps - Standing Hip Extension  with Counter Support  - 1 x daily - 7 x weekly - 1 sets - 10 reps - Standing Hip Abduction with Counter Support  - 1 x daily - 7 x weekly - 1 sets - 10 reps - Standing March with Counter Support  - 1 x daily - 7 x weekly - 1 sets - 10 reps - Seated Marching with Opposite Shoulder Flexion  - 1 x daily - 7 x weekly - 1 sets - 10 reps - Single Leg Stance with Support  - 1 x daily - 7 x weekly - 1 sets - 2 reps - 10 sec hold - Step Sideways  - 1 x daily - 7 x weekly - 1 sets - 10 reps  GOALS: Goals reviewed with patient? Yes  SHORT TERM GOALS: SAME AS LTG'S   LONG TERM GOALS: Target date:  01/23/2024  Pt will be independent and compliant with strength and balance focused HEP in order to maintain functional progress and improve mobility. Baseline: IND and compliant (9/3) Goal status: MET  2.  Pt will decrease 5xSTS to </=14.34 seconds using safest method to obtain upright in order to demonstrate decreased risk for falls and improved functional bilateral LE strength and power. Baseline:  19.34 sec no UE support w/ back of knees on chair CGA; 16.18 sec w/o UE support and mild forward trunk lean (9/3) Goal status: PARTIALLY MET  3.  Pt will demonstrate a gait speed of >/=2.43 feet/sec in order to decrease risk for falls. Baseline:  2.23 ft/sec w/ rollator; 2.10 ft/sec w/ rollator (9/3) Goal status: NOT MET  4.  Pt will demonstrate TUG of </=22.25 seconds in order to decrease risk of falls and improve functional mobility using LRAD. Baseline: 27.25 sec w/ rollator; 22.78 sec w/ rollator (9/3) Goal status: PARTIALLY MET  GOALS (at 9/3 re-cert): Goals reviewed with patient? Yes  SHORT TERM GOALS: SAME AS LTG'S   LONG TERM GOALS: Target date:  02/27/2024  Pt will be independent and compliant with strength and balance focused HEP in order to maintain functional progress and improve mobility. Baseline: IND and compliant (9/3); IND and compliant (10/6) Goal status: MET  2.  Pt will decrease  5xSTS to </=14.34 seconds using safest method to obtain upright in order to demonstrate decreased risk for falls and improved functional bilateral LE strength and power. Baseline:  19.34 sec no UE support w/ back of knees on chair CGA; 16.18 sec w/o UE support and mild forward trunk lean (9/3); 20.06 sec no UE support, intermittent back of knees on mat surface (10/6) Goal status: NOT MET  3.  Pt  will demonstrate a gait speed of >/=2.33 feet/sec in order to decrease risk for falls. Baseline:  2.23 ft/sec w/ rollator; 2.10 ft/sec w/ rollator (9/3); 1.94 ft/sec w/ rollator SBA (10/6) Goal status: NOT MET  4.  Pt will demonstrate TUG of </=19.25 seconds in order to decrease risk of falls and improve functional mobility using LRAD. Baseline: 27.25 sec w/ rollator; 22.78 sec w/ rollator (9/3); 24.69 sec w/ rollator (10/6) Goal status: NOT MET  GOALS (at 89/3 re-cert): Goals reviewed with patient? Yes  SHORT TERM GOALS: SAME AS LTG'S   LONG TERM GOALS: Target date:  03/26/2024  Pt will demonstrate floor recovery using no more than maxA and ability to moderately direct care for +2 assist in order to improve home safety. Baseline: Neighbor has to hoist him up (10/6) Goal status: INITIAL  2.  Pt will decrease 5xSTS to </=14.34 seconds using safest method to obtain upright in order to demonstrate decreased risk for falls and improved functional bilateral LE strength and power. Baseline:  19.34 sec no UE support w/ back of knees on chair CGA; 16.18 sec w/o UE support and mild forward trunk lean (9/3); 20.06 sec no UE support, intermittent back of knees on mat surface (10/6) Goal status: ONGOING  ASSESSMENT:  CLINICAL IMPRESSION: Emphasis of skilled session today on assessment of LTGs w/ pt making limited progress towards goals as written.  He has been very compliant as of late to HEP to address functional strengthening and motor control which were impacting his performance today.  He has further  needs of PT to assess and help pt direct floor recovery to reduce risk of injury in home.  New goal set for this with appts established to support work towards this goal.  Continue per POC.  OBJECTIVE IMPAIRMENTS: Abnormal gait, decreased balance, decreased coordination, decreased knowledge of condition, decreased knowledge of use of DME, decreased strength, decreased safety awareness, and improper body mechanics.   ACTIVITY LIMITATIONS: carrying, lifting, bending, standing, squatting, transfers, reach over head, and locomotion level  PARTICIPATION LIMITATIONS: cleaning, laundry, shopping, community activity, and yard work  PERSONAL FACTORS: Age, Fitness, Past/current experiences, Time since onset of injury/illness/exacerbation, and 1-2 comorbidities: Cerebellar ataxia, mild cognitive impairment are also affecting patient's functional outcome.   REHAB POTENTIAL: Fair due to progressive nature of disease  CLINICAL DECISION MAKING: Evolving/moderate complexity  EVALUATION COMPLEXITY: Moderate  PLAN:  PT FREQUENCY: 2x/week + 1x/wk + 1x/wk  PT DURATION: 4 weeks + eval + 4 wks + 4 wks  PLANNED INTERVENTIONS: [id number]- PT Re-evaluation, 97750- Physical Performance Testing, 97110-Therapeutic exercises, 97530- Therapeutic activity, [id number]- Neuromuscular re-education, [phone]- Self Care, [id number]- Gait training, [phone]- Aquatic Therapy, Patient/Family education, Balance training, Stair training, Vestibular training, and DME instructions  PLAN FOR NEXT SESSION: Expand HEP prn.  Work w/ 2WW vs rollator indoor and outdoors - tight Psychiatrist.  Endurance - SciFit >/= level 7.0.  BLE strengthening. Static balance, ankle wts for standing balance tasks - hurdles, therastones, rebounder?  STS from low or soft surfaces  Pt requesting to practice getting up from supine and getting off the floor as he had a difficult time getting up from under sink.  +2 w/ [place] on 10/22 appt.  Daved KATHEE Bull,  PT, DPT 02/23/2024, 11:46 AM

## 2024-03-03 ENCOUNTER — Ambulatory Visit: Payer: Self-pay | Admitting: Physical Therapy

## 2024-03-10 ENCOUNTER — Ambulatory Visit: Payer: Self-pay | Admitting: Physical Therapy

## 2024-03-10 ENCOUNTER — Encounter: Payer: Self-pay | Admitting: Physical Therapy

## 2024-03-10 DIAGNOSIS — R2689 Other abnormalities of gait and mobility: Secondary | ICD-10-CM | POA: Diagnosis not present

## 2024-03-10 DIAGNOSIS — L720 Epidermal cyst: Secondary | ICD-10-CM | POA: Diagnosis not present

## 2024-03-10 DIAGNOSIS — R260 Ataxic gait: Secondary | ICD-10-CM

## 2024-03-10 DIAGNOSIS — Z9181 History of falling: Secondary | ICD-10-CM

## 2024-03-10 DIAGNOSIS — Z85828 Personal history of other malignant neoplasm of skin: Secondary | ICD-10-CM | POA: Diagnosis not present

## 2024-03-10 DIAGNOSIS — L570 Actinic keratosis: Secondary | ICD-10-CM | POA: Diagnosis not present

## 2024-03-10 DIAGNOSIS — M6281 Muscle weakness (generalized): Secondary | ICD-10-CM

## 2024-03-10 DIAGNOSIS — L821 Other seborrheic keratosis: Secondary | ICD-10-CM | POA: Diagnosis not present

## 2024-03-10 DIAGNOSIS — R2681 Unsteadiness on feet: Secondary | ICD-10-CM

## 2024-03-10 DIAGNOSIS — R278 Other lack of coordination: Secondary | ICD-10-CM

## 2024-03-10 NOTE — Therapy (Signed)
 OUTPATIENT PHYSICAL THERAPY NEURO TREATMENT   Patient Name: Derrick White MRN: [id number] DOB:[birth year], 83 y.o., male 56 Date: 03/10/2024   PCP: [name]., MD REFERRING PROVIDER: [name], MD   END OF SESSION:  PT End of Session - 03/10/24 1509     Visit Number 12    Number of Visits 17   13 + 4   Date for Recertification  04/02/24   pushed out due to potential rescheduling needs of pt at re-cert 89/3   Authorization Type [employer]    Progress Note Due on Visit 10    PT Start Time 1449    PT Stop Time 1532    PT Time Calculation (min) 43 min    Equipment Utilized During Treatment Gait belt    Activity Tolerance Patient tolerated treatment well    Behavior During Therapy [facility] for tasks assessed/performed          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 05/04/2019   Memory loss 03/16/2019   Mild cognitive impairment 12/12/2017   Pain due to onychomycosis of toenails of both feet 04/28/2019   Pain in left ankle and joints of left foot 08/06/2018   Slurred speech 03/16/2019   Superficial peroneal nerve neuropathy, 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   TIA (transient ischemic attack) 11/05/2019   Worsened handwriting 03/16/2019   Past Surgical History:  Procedure Laterality Date   APPENDECTOMY     1940s   BACK SURGERY     1981, 1998   CARDIOVERSION N/A 09/13/2020   Procedure: CARDIOVERSION;  Surgeon: [name], MD;  Location: [facility] ENDOSCOPY;  Service: Cardiovascular;  Laterality: N/A;   FRACTURE SURGERY     Left ankle, sx x4   INGUINAL HERNIA REPAIR Right 03/05/2019   Procedure: OPEN RIGHT INGUINAL HERNIA REPAIR WITH MESH;   Surgeon: [name], MD;  Location: [facility] OR;  Service: General;  Laterality: Right;   KNEE SURGERY Left 1985   LEFT ATRIAL APPENDAGE OCCLUSION N/A 07/27/2020   Procedure: LEFT ATRIAL APPENDAGE OCCLUSION;  Surgeon: [name], MD;  Location: [facility] INVASIVE CV LAB;  Service: Cardiovascular;  Laterality: N/A;   SHOULDER SURGERY Left    TEE WITHOUT CARDIOVERSION N/A 07/27/2020   Procedure: TRANSESOPHAGEAL ECHOCARDIOGRAM (TEE);  Surgeon: [name], MD;  Location: [facility] INVASIVE CV LAB;  Service: Cardiovascular;  Laterality: N/A;   TEE WITHOUT CARDIOVERSION N/A 09/13/2020   Procedure: TRANSESOPHAGEAL ECHOCARDIOGRAM (TEE);  Surgeon: [name], MD;  Location: [facility] ENDOSCOPY;  Service: Cardiovascular;  Laterality: N/A;   TONSILLECTOMY     1945   Patient Active Problem List   Diagnosis Date Noted   Pain in right shoulder 07/31/2021   Persistent atrial fibrillation (HCC) 08/28/2020   Secondary hypercoagulable state 07/20/2020   Paroxysmal atrial fibrillation (HCC) 06/19/2020   TIA (transient ischemic attack) 11/05/2019   Essential hypertension 11/05/2019   Cerebellar ataxia (HCC) 11/05/2019   Lumbosacral radiculopathy at S1 05/04/2019   C6 radiculopathy 05/04/2019   Pain due to onychomycosis of toenails of both feet 04/28/2019   Worsened handwriting 03/16/2019   Gait disturbance 03/16/2019   Slurred speech 03/16/2019   Memory loss 03/16/2019  Pain in left ankle and joints of left foot 08/06/2018   Mild cognitive impairment 12/12/2017   Foot pain, left 06/04/2016   Dysesthesia 06/04/2016   Superficial peroneal nerve neuropathy, 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   History of cervical spinal surgery 06/04/2016    ONSET DATE: several years  REFERRING DIAG:  R26.9 (ICD-10-CM) - Gait disturbance  R29.898 (ICD-10-CM) - Weakness of both lower extremities    THERAPY DIAG:  Other abnormalities of gait and mobility  Unsteadiness on feet  Other lack of coordination  Muscle weakness  (generalized)  History of falling  Ataxic gait  Rationale for Evaluation and Treatment: Rehabilitation  SUBJECTIVE:                                                                                                                                                                                             SUBJECTIVE STATEMENT:  Pt denies pain.  He presents with rollator.  He states he is doing well today.  He has had some close calls but no falls.  His right hip is mildly sore when he walks, but he is unsure why as he has not injured it that he is aware of.  Pt accompanied by: self (he drove himself)  PERTINENT HISTORY: C6 radiculopathy, Cerebellar ataxia (HCC), Dysesthesia, Essential hypertension, Foot pain (left), Gait disturbance, History of cervical spinal surgery (06/04/2016), History of hiatal hernia, History of kidney stones, Lumbosacral radiculopathy at S1, Memory loss, Mild cognitive impairment, Pain due to onychomycosis of toenails of both feet, Pain in left ankle and joints of left foot, Slurred speech, Superficial peroneal nerve neuropathy, left (06/04/2009),TIA (transient ischemic attack) 11/05/2019. NCV/EMG study did not show any evidence of motor neuron disease. We did the [employer] SCA panel (30+ genes). It was borderline. 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 No  PRECAUTIONS: Fall  RED FLAGS: None   WEIGHT BEARING RESTRICTIONS: No  FALLS: Has patient fallen in last 6 months? Yes. Number of falls 2 - both without rollator/AD  LIVING ENVIRONMENT: Lives with: lives with their spouse Lives in: House/apartment Stairs: Yes: Internal: 12 steps; can reach both and External: 2 steps; can reach both - in back; pt reports he does not access 2nd level of home - has bedroom on lower level Has following  equipment at home: [name] - 4 wheeled, walking canes  PLOF: Independent with basic ADLs, Independent with household mobility with device, Independent with community mobility  with device, and Needs assistance with homemaking  PATIENT GOALS: to get better, but no specific goal  OBJECTIVE:  Note: Objective measures were completed at Evaluation unless otherwise noted.  DIAGNOSTIC FINDINGS: No recent relevant imaging.  COGNITION: Overall cognitive status: History of cognitive impairments - at baseline   SENSATION: WFL  COORDINATION: Ataxia of BLE  POSTURE: rounded shoulders and forward head  LOWER EXTREMITY ROM:   BLE WFL   LOWER EXTREMITY MMT (tested in seated):    MMT Right Eval Left Eval  Hip flexion 5 5  Hip extension    Hip abduction 4+ 4+  Hip adduction    Hip internal rotation    Hip external rotation    Knee flexion 5 5  Knee extension 5 5  Ankle dorsiflexion 5 5  Ankle plantarflexion    Ankle inversion    Ankle eversion    (Blank rows = not tested)  BED MOBILITY:  Modified independent - sleeps in standard bed w/o added features  TRANSFERS: Assistive device utilized: Environmental consultant - 4 wheeled  Sit to stand: SBA and cues for using brakes appropriate Stand to sit: SBA and cues for rollator brake use  GAIT: Gait pattern: ataxic Distance walked: various clinic distances ranging from 40-100' Assistive device utilized: Environmental consultant - 4 wheeled Level of assistance: CGA Comments: needs cues to maintain BUE on handles as pt distracted in open gym  FUNCTIONAL TESTS:  12/26/2023 EVAL: 5 times sit to stand: 19.34 sec no UE support w/ back of knees on chair CGA, declined edu to use UE for balance and to come completely into upright Timed up and go (TUG): 27.25 sec, significant difficulty turning to sit requiring mod cues to improve approach 10 meter walk test: 14.78 sec w/ rollator CGA w/ increased trunk lean = 0.98m/sec = 2.23 ft/sec  FROM PRIOR EVAL: 5 times sit to stand:  13.57 secs without UE support from mat Timed up and go (TUG): 24.12 secs 10 meter walk test: 18.19 secs = 1.80 ft/sec with RW                                                                                                                              TREATMENT DATE: 03/10/24  SciFit x8 minutes using BUE/BLE in dual peaks mode up to level 6.0 for intense cardiovascular challenge, global strengthening and large amplitude reciprocal mobility. Resisted gait w/ rollator SBA-CGA 2x115' (second bout added multi-directional perturbations), no significant LOB Soccer ball kicks w/ bil 3lb ankle wt to midline target > stop and roll w/ ankle wts > alternating toe taps attempting increased speed w/ LUE support on chair back STS from 12.5 bench progressing from BUE support to none minA-CGA x11  PATIENT EDUCATION: Education details: Please work on HEP - can reduce frequency but importance of consistent movement. Continue going to senior center - he goes once a week.  Goal of floor recovery next session w/ +2 so please don't reschedule time if  can help it! Person educated: Patient Education method: Explanation, Demonstration, and Verbal cues Education comprehension: verbalized understanding and needs further education  HOME EXERCISE PROGRAM: Access Code: [email or web address] URL: [email or web address] Date: 01/02/2024 Prepared by: Daved Bull  Exercises - Sit to Stand Without Arm Support  - 1 x daily - 7 x weekly - 1 sets - 10 reps - Standing Hip Flexion with Counter Support  - 1 x daily - 7 x weekly - 1 sets - 10 reps - Standing Hip Extension with Counter Support  - 1 x daily - 7 x weekly - 1 sets - 10 reps - Standing Hip Abduction with Counter Support  - 1 x daily - 7 x weekly - 1 sets - 10 reps - Standing March with Counter Support  - 1 x daily - 7 x weekly - 1 sets - 10 reps - Seated Marching with Opposite Shoulder Flexion  - 1 x daily - 7 x weekly - 1 sets - 10 reps - Single Leg Stance  with Support  - 1 x daily - 7 x weekly - 1 sets - 2 reps - 10 sec hold - Step Sideways  - 1 x daily - 7 x weekly - 1 sets - 10 reps  GOALS: Goals reviewed with patient? Yes  SHORT TERM GOALS: SAME AS LTG'S   LONG TERM GOALS: Target date:  01/23/2024  Pt will be independent and compliant with strength and balance focused HEP in order to maintain functional progress and improve mobility. Baseline: IND and compliant (9/3) Goal status: MET  2.  Pt will decrease 5xSTS to </=14.34 seconds using safest method to obtain upright in order to demonstrate decreased risk for falls and improved functional bilateral LE strength and power. Baseline:  19.34 sec no UE support w/ back of knees on chair CGA; 16.18 sec w/o UE support and mild forward trunk lean (9/3) Goal status: PARTIALLY MET  3.  Pt will demonstrate a gait speed of >/=2.43 feet/sec in order to decrease risk for falls. Baseline:  2.23 ft/sec w/ rollator; 2.10 ft/sec w/ rollator (9/3) Goal status: NOT MET  4.  Pt will demonstrate TUG of </=22.25 seconds in order to decrease risk of falls and improve functional mobility using LRAD. Baseline: 27.25 sec w/ rollator; 22.78 sec w/ rollator (9/3) Goal status: PARTIALLY MET  GOALS (at 9/3 re-cert): Goals reviewed with patient? Yes  SHORT TERM GOALS: SAME AS LTG'S   LONG TERM GOALS: Target date:  02/27/2024  Pt will be independent and compliant with strength and balance focused HEP in order to maintain functional progress and improve mobility. Baseline: IND and compliant (9/3); IND and compliant (10/6) Goal status: MET  2.  Pt will decrease 5xSTS to </=14.34 seconds using safest method to obtain upright in order to demonstrate decreased risk for falls and improved functional bilateral LE strength and power. Baseline:  19.34 sec no UE support w/ back of knees on chair CGA; 16.18 sec w/o UE support and mild forward trunk lean (9/3); 20.06 sec no UE support, intermittent back of knees on mat  surface (10/6) Goal status: NOT MET  3.  Pt will demonstrate a gait speed of >/=2.33 feet/sec in order to decrease risk for falls. Baseline:  2.23 ft/sec w/ rollator; 2.10 ft/sec w/ rollator (9/3); 1.94 ft/sec w/ rollator SBA (10/6) Goal status: NOT MET  4.  Pt will demonstrate TUG of </=19.25 seconds in order to decrease risk of falls and improve functional mobility using LRAD. Baseline: 27.25 sec w/  rollator; 22.78 sec w/ rollator (9/3); 24.69 sec w/ rollator (10/6) Goal status: NOT MET  GOALS (at 89/3 re-cert): Goals reviewed with patient? Yes  SHORT TERM GOALS: SAME AS LTG'S   LONG TERM GOALS: Target date:  03/26/2024  Pt will demonstrate floor recovery using no more than maxA and ability to moderately direct care for +2 assist in order to improve home safety. Baseline: Neighbor has to hoist him up (10/6) Goal status: INITIAL  2.  Pt will decrease 5xSTS to </=14.34 seconds using safest method to obtain upright in order to demonstrate decreased risk for falls and improved functional bilateral LE strength and power. Baseline:  19.34 sec no UE support w/ back of knees on chair CGA; 16.18 sec w/o UE support and mild forward trunk lean (9/3); 20.06 sec no UE support, intermittent back of knees on mat surface (10/6) Goal status: ONGOING  ASSESSMENT:  CLINICAL IMPRESSION: Focus of skilled session today on improving coordination and LE motor control.  He does well with ankle wt tasks and standing from low surfaces.  He may benefit from adding cushioning to low surfaces to simulate home setup more.  Plan to practice full floor recovery w/ +2 assistance for improved direction of care and sequencing next session if pt does not reschedule time.  Continue per POC.  OBJECTIVE IMPAIRMENTS: Abnormal gait, decreased balance, decreased coordination, decreased knowledge of condition, decreased knowledge of use of DME, decreased strength, decreased safety awareness, and improper body mechanics.    ACTIVITY LIMITATIONS: carrying, lifting, bending, standing, squatting, transfers, reach over head, and locomotion level  PARTICIPATION LIMITATIONS: cleaning, laundry, shopping, community activity, and yard work  PERSONAL FACTORS: Age, Fitness, Past/current experiences, Time since onset of injury/illness/exacerbation, and 1-2 comorbidities: Cerebellar ataxia, mild cognitive impairment are also affecting patient's functional outcome.   REHAB POTENTIAL: Fair due to progressive nature of disease  CLINICAL DECISION MAKING: Evolving/moderate complexity  EVALUATION COMPLEXITY: Moderate  PLAN:  PT FREQUENCY: 2x/week + 1x/wk + 1x/wk  PT DURATION: 4 weeks + eval + 4 wks + 4 wks  PLANNED INTERVENTIONS: 97164- PT Re-evaluation, 97750- Physical Performance Testing, 97110-Therapeutic exercises, 97530- Therapeutic activity, [id number]- Neuromuscular re-education, [phone]- Self Care, [id number]- Gait training, [phone]- Aquatic Therapy, Patient/Family education, Balance training, Stair training, Vestibular training, and DME instructions  PLAN FOR NEXT SESSION: Expand HEP prn.  Work w/ 2WW vs rollator indoor and outdoors - tight Psychiatrist.  Endurance - SciFit >/= level 7.0.  BLE strengthening. Static balance, ankle wts for standing balance tasks - hurdles, therastones, rebounder?  STS from low or soft surfaces  Pt requesting to practice getting up from supine and getting off the floor as he had a difficult time getting up from under sink.  +2 w/ [place] on 10/22 appt.  Daved KATHEE Bull, PT, DPT 03/10/2024, 3:37 PM

## 2024-03-17 ENCOUNTER — Encounter: Payer: Self-pay | Admitting: Physical Therapy

## 2024-03-17 ENCOUNTER — Ambulatory Visit: Payer: Self-pay | Admitting: Physical Therapy

## 2024-03-17 DIAGNOSIS — R278 Other lack of coordination: Secondary | ICD-10-CM

## 2024-03-17 DIAGNOSIS — R2689 Other abnormalities of gait and mobility: Secondary | ICD-10-CM

## 2024-03-17 DIAGNOSIS — R2681 Unsteadiness on feet: Secondary | ICD-10-CM

## 2024-03-17 DIAGNOSIS — M6281 Muscle weakness (generalized): Secondary | ICD-10-CM

## 2024-03-17 DIAGNOSIS — R260 Ataxic gait: Secondary | ICD-10-CM

## 2024-03-17 DIAGNOSIS — Z9181 History of falling: Secondary | ICD-10-CM

## 2024-03-17 NOTE — Therapy (Signed)
 OUTPATIENT PHYSICAL THERAPY NEURO TREATMENT   Patient Name: Derrick White MRN: [id number] DOB:December 09, 1940, 83 y.o., male 44 Date: 03/17/2024   PCP: [name]., MD REFERRING PROVIDER: [name], MD   END OF SESSION:  PT End of Session - 03/17/24 1103     Visit Number 13    Number of Visits 17   13 + 4   Date for Recertification  04/02/24   pushed out due to potential rescheduling needs of pt at re-cert 89/3   Authorization Type [employer]    Progress Note Due on Visit 10    PT Start Time 1100    PT Stop Time 1144    PT Time Calculation (min) 44 min    Equipment Utilized During Treatment Gait belt    Activity Tolerance Patient tolerated treatment well    Behavior During Therapy [facility] for tasks assessed/performed          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 05/04/2019   Memory loss 03/16/2019   Mild cognitive impairment 12/12/2017   Pain due to onychomycosis of toenails of both feet 04/28/2019   Pain in left ankle and joints of left foot 08/06/2018   Slurred speech 03/16/2019   Superficial peroneal nerve neuropathy, 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   TIA (transient ischemic attack) 11/05/2019   Worsened handwriting 03/16/2019   Past Surgical History:  Procedure Laterality Date   APPENDECTOMY     1940s   BACK SURGERY     1981, 1998   CARDIOVERSION N/A 09/13/2020   Procedure: CARDIOVERSION;  Surgeon: [name], MD;  Location: [facility] ENDOSCOPY;  Service: Cardiovascular;  Laterality: N/A;   FRACTURE SURGERY     Left ankle, sx x4   INGUINAL HERNIA REPAIR Right 03/05/2019   Procedure: OPEN RIGHT INGUINAL HERNIA REPAIR WITH MESH;   Surgeon: [name], MD;  Location: [facility] OR;  Service: General;  Laterality: Right;   KNEE SURGERY Left 1985   LEFT ATRIAL APPENDAGE OCCLUSION N/A 07/27/2020   Procedure: LEFT ATRIAL APPENDAGE OCCLUSION;  Surgeon: [name], MD;  Location: [facility] INVASIVE CV LAB;  Service: Cardiovascular;  Laterality: N/A;   SHOULDER SURGERY Left    TEE WITHOUT CARDIOVERSION N/A 07/27/2020   Procedure: TRANSESOPHAGEAL ECHOCARDIOGRAM (TEE);  Surgeon: [name], MD;  Location[facility] INVASIVE CV LAB;  Service: Cardiovascular;  Laterality: N/A;   TEE WITHOUT CARDIOVERSION N/A 09/13/2020   Procedure: TRANSESOPHAGEAL ECHOCARDIOGRAM (TEE);  Surgeon: [name], MD;  Location: [facility] ENDOSCOPY;  Service: Cardiovascular;  Laterality: N/A;   TONSILLECTOMY     1945   Patient Active Problem List   Diagnosis Date Noted   Pain in right shoulder 07/31/2021   Persistent atrial fibrillation (HCC) 08/28/2020   Secondary hypercoagulable state 07/20/2020   Paroxysmal atrial fibrillation (HCC) 06/19/2020   TIA (transient ischemic attack) 11/05/2019   Essential hypertension 11/05/2019   Cerebellar ataxia (HCC) 11/05/2019   Lumbosacral radiculopathy at S1 05/04/2019   C6 radiculopathy 05/04/2019   Pain due to onychomycosis of toenails of both feet 04/28/2019   Worsened handwriting 03/16/2019   Gait disturbance 03/16/2019   Slurred speech 03/16/2019   Memory loss 03/16/2019  Pain in left ankle and joints of left foot 08/06/2018   Mild cognitive impairment 12/12/2017   Foot pain, left 06/04/2016   Dysesthesia 06/04/2016   Superficial peroneal nerve neuropathy, 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   History of cervical spinal surgery 06/04/2016    ONSET DATE: several years  REFERRING DIAG:  R26.9 (ICD-10-CM) - Gait disturbance  R29.898 (ICD-10-CM) - Weakness of both lower extremities    THERAPY DIAG:  Other abnormalities of gait and mobility  Unsteadiness on feet  Other lack of coordination  Muscle weakness  (generalized)  History of falling  Ataxic gait  Rationale for Evaluation and Treatment: Rehabilitation  SUBJECTIVE:                                                                                                                                                                                             SUBJECTIVE STATEMENT:  Pt denies pain.  He presents with rollator.  He states he is doing well today, but not enjoying the cold weather.    Pt accompanied by: self (he drove himself)  PERTINENT HISTORY: C6 radiculopathy, Cerebellar ataxia (HCC), Dysesthesia, Essential hypertension, Foot pain (left), Gait disturbance, History of cervical spinal surgery (06/04/2016), History of hiatal hernia, History of kidney stones, Lumbosacral radiculopathy at S1, Memory loss, Mild cognitive impairment, Pain due to onychomycosis of toenails of both feet, Pain in left ankle and joints of left foot, Slurred speech, Superficial peroneal nerve neuropathy, left (06/04/2009),TIA (transient ischemic attack) 11/05/2019. NCV/EMG study did not show any evidence of motor neuron disease. We did the [employer] SCA panel (30+ genes). It was borderline. 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 Unclear if this is playing a role (SCA type 6 has mutations in this Jagar but usually triplet repeat)     PAIN:  Are you having pain? No  PRECAUTIONS: Fall  RED FLAGS: None   WEIGHT BEARING RESTRICTIONS: No  FALLS: Has patient fallen in last 6 months? Yes. Number of falls 2 - both without rollator/AD  LIVING ENVIRONMENT: Lives with: lives with their spouse Lives in: House/apartment Stairs: Yes: Internal: 12 steps; can reach both and External: 2 steps; can reach both - in back; pt reports he does not access 2nd level of home - has bedroom on lower level Has following equipment at home: [name] - 4 wheeled, walking canes  PLOF: Independent with basic ADLs, Independent with household  mobility with device, Independent with community mobility with device, and Needs assistance with homemaking  PATIENT GOALS: to get better, but no specific goal  OBJECTIVE:  Note: Objective measures were completed at Evaluation unless  otherwise noted.  DIAGNOSTIC FINDINGS: No recent relevant imaging.  COGNITION: Overall cognitive status: History of cognitive impairments - at baseline   SENSATION: WFL  COORDINATION: Ataxia of BLE  POSTURE: rounded shoulders and forward head  LOWER EXTREMITY ROM:   BLE WFL   LOWER EXTREMITY MMT (tested in seated):    MMT Right Eval Left Eval  Hip flexion 5 5  Hip extension    Hip abduction 4+ 4+  Hip adduction    Hip internal rotation    Hip external rotation    Knee flexion 5 5  Knee extension 5 5  Ankle dorsiflexion 5 5  Ankle plantarflexion    Ankle inversion    Ankle eversion    (Blank rows = not tested)  BED MOBILITY:  Modified independent - sleeps in standard bed w/o added features  TRANSFERS: Assistive device utilized: [employer] Consultant - 4 wheeled  Sit to stand: SBA and cues for using brakes appropriate Stand to sit: SBA and cues for rollator brake use  GAIT: Gait pattern: ataxic Distance walked: various clinic distances ranging from 40-100' Assistive device utilized: Environmental Consultant - 4 wheeled Level of assistance: CGA Comments: needs cues to maintain BUE on handles as pt distracted in open gym  FUNCTIONAL TESTS:  12/26/2023 EVAL: 5 times sit to stand: 19.34 sec no UE support w/ back of knees on chair CGA, declined edu to use UE for balance and to come completely into upright Timed up and go (TUG): 27.25 sec, significant difficulty turning to sit requiring mod cues to improve approach 10 meter walk test: 14.78 sec w/ rollator CGA w/ increased trunk lean = 0.5m/sec = 2.23 ft/sec  FROM PRIOR EVAL: 5 times sit to stand: 13.57 secs without UE support from mat Timed up and go (TUG): 24.12 secs 10 meter walk test: 18.19 secs = 1.80  ft/sec with RW                                                                                                                              TREATMENT DATE: 03/17/24  Floor Recovery: Patient educated in floor recovery this visit using teach-back for injury assessment and sequencing of task in clinic setting.  Discussion of transfer of skills to variable scenarios outside the clinic.  Patient has most difficulty with standing w/o support surface.  Performed 3 times from supine.  Time spent cuing for sequencing and to protect head as pt has poor spatial awareness.  He is able to complete first 2 attempts CGA w/ mat table, but is unable to fully sequence RLE into bilateral stance for return to upright without physical assistance.  Was educated on need to carry phone at all time with contacts he can readily access to come help him as he cannot lay in the floor for prolonged periods (especially if he thinks he hit his head).  Discussed having stool in common rooms or chair people can bring to him or he can crawl to  in order to turn and sit vs standing straight up.  Required +2 on last attempt for last 50% of stand due to RLE ataxia creating significant forward LOB.  Discussed moving slowly w/ floor recovery to limit as much ataxic movement as possible. Caregiver Training:  Caregiver not present.   Level of Assist:  Verbal/tactile cues, SBA, MinA, and ModA.   SciFit x8 minutes using BUE/BLE support up to level 7.0 for large amplitude reciprocal mobility, cardiac endurance, and BLE strengthening.  RPE 7/10 at end of task. STS no UE support from airex on mat table w/ feet on blue mat on floor (22.5) x12, cues and CGA for forward momentum and maintained anterior weight shift > from 12.5 compliant surface w/ feet on blue mat x10, increased attempts w/ more difficulty maintaining forward momentum on initial reps  PATIENT EDUCATION: Education details: Please work on HEP - can reduce frequency but importance of  consistent movement.  No need to practice floor recovery at home due to safety concerns - but start practicing carrying phone everywhere when up and moving. Continue going to senior center - he goes once a week.  Plan for discharge next visit. Person educated: Patient Education method: Explanation, Demonstration, and Verbal cues Education comprehension: verbalized understanding and needs further education  HOME EXERCISE PROGRAM: Access Code: [email or web address] URL: [email or web address] Date: 01/02/2024 Prepared by: Daved Bull  Exercises - Sit to Stand Without Arm Support  - 1 x daily - 7 x weekly - 1 sets - 10 reps - Standing Hip Flexion with Counter Support  - 1 x daily - 7 x weekly - 1 sets - 10 reps - Standing Hip Extension with Counter Support  - 1 x daily - 7 x weekly - 1 sets - 10 reps - Standing Hip Abduction with Counter Support  - 1 x daily - 7 x weekly - 1 sets - 10 reps - Standing March with Counter Support  - 1 x daily - 7 x weekly - 1 sets - 10 reps - Seated Marching with Opposite Shoulder Flexion  - 1 x daily - 7 x weekly - 1 sets - 10 reps - Single Leg Stance with Support  - 1 x daily - 7 x weekly - 1 sets - 2 reps - 10 sec hold - Step Sideways  - 1 x daily - 7 x weekly - 1 sets - 10 reps  GOALS: Goals reviewed with patient? Yes  SHORT TERM GOALS: SAME AS LTG'S   LONG TERM GOALS: Target date:  01/23/2024  Pt will be independent and compliant with strength and balance focused HEP in order to maintain functional progress and improve mobility. Baseline: IND and compliant (9/3) Goal status: MET  2.  Pt will decrease 5xSTS to </=14.34 seconds using safest method to obtain upright in order to demonstrate decreased risk for falls and improved functional bilateral LE strength and power. Baseline:  19.34 sec no UE support w/ back of knees on chair CGA; 16.18 sec w/o UE support and mild forward trunk lean (9/3) Goal status: PARTIALLY MET  3.  Pt will demonstrate a  gait speed of >/=2.43 feet/sec in order to decrease risk for falls. Baseline:  2.23 ft/sec w/ rollator; 2.10 ft/sec w/ rollator (9/3) Goal status: NOT MET  4.  Pt will demonstrate TUG of </=22.25 seconds in order to decrease risk of falls and improve functional mobility using LRAD. Baseline: 27.25 sec w/ rollator; 22.78 sec w/ rollator (9/3) Goal status: PARTIALLY MET  GOALS (at  9/3 re-cert): Goals reviewed with patient? Yes  SHORT TERM GOALS: SAME AS LTG'S   LONG TERM GOALS: Target date:  02/27/2024  Pt will be independent and compliant with strength and balance focused HEP in order to maintain functional progress and improve mobility. Baseline: IND and compliant (9/3); IND and compliant (10/6) Goal status: MET  2.  Pt will decrease 5xSTS to </=14.34 seconds using safest method to obtain upright in order to demonstrate decreased risk for falls and improved functional bilateral LE strength and power. Baseline:  19.34 sec no UE support w/ back of knees on chair CGA; 16.18 sec w/o UE support and mild forward trunk lean (9/3); 20.06 sec no UE support, intermittent back of knees on mat surface (10/6) Goal status: NOT MET  3.  Pt will demonstrate a gait speed of >/=2.33 feet/sec in order to decrease risk for falls. Baseline:  2.23 ft/sec w/ rollator; 2.10 ft/sec w/ rollator (9/3); 1.94 ft/sec w/ rollator SBA (10/6) Goal status: NOT MET  4.  Pt will demonstrate TUG of </=19.25 seconds in order to decrease risk of falls and improve functional mobility using LRAD. Baseline: 27.25 sec w/ rollator; 22.78 sec w/ rollator (9/3); 24.69 sec w/ rollator (10/6) Goal status: NOT MET  GOALS (at 89/3 re-cert): Goals reviewed with patient? Yes  SHORT TERM GOALS: SAME AS LTG'S   LONG TERM GOALS: Target date:  03/26/2024  Pt will demonstrate floor recovery using no more than maxA and ability to moderately direct care for +2 assist in order to improve home safety. Baseline: Neighbor has to hoist him  up (10/6) Goal status: INITIAL  2.  Pt will decrease 5xSTS to </=14.34 seconds using safest method to obtain upright in order to demonstrate decreased risk for falls and improved functional bilateral LE strength and power. Baseline:  19.34 sec no UE support w/ back of knees on chair CGA; 16.18 sec w/o UE support and mild forward trunk lean (9/3); 20.06 sec no UE support, intermittent back of knees on mat surface (10/6) Goal status: ONGOING  ASSESSMENT:  CLINICAL IMPRESSION: Focus of skilled session today on functional transfers from floor and compliant surfaces as he has had recent increased difficulty with these.  He does well as long as he has an UE support surface especially for floor recovery.  His ability to direct his needs from floor recovery may be limited due to decreased safety awareness and need for cues to improve spatial awareness to protect body and head.  He does well from 12.5 compliant surface today, but continues to need reminders to improve anterior sequencing of standing transfers.  Planning to discharge next visit and pt in agreement to plan.   OBJECTIVE IMPAIRMENTS: Abnormal gait, decreased balance, decreased coordination, decreased knowledge of condition, decreased knowledge of use of DME, decreased strength, decreased safety awareness, and improper body mechanics.   ACTIVITY LIMITATIONS: carrying, lifting, bending, standing, squatting, transfers, reach over head, and locomotion level  PARTICIPATION LIMITATIONS: cleaning, laundry, shopping, community activity, and yard work  PERSONAL FACTORS: Age, Fitness, Past/current experiences, Time since onset of injury/illness/exacerbation, and 1-2 comorbidities: Cerebellar ataxia, mild cognitive impairment are also affecting patient's functional outcome.   REHAB POTENTIAL: Fair due to progressive nature of disease  CLINICAL DECISION MAKING: Evolving/moderate complexity  EVALUATION COMPLEXITY: Moderate  PLAN:  PT FREQUENCY:  2x/week + 1x/wk + 1x/wk  PT DURATION: 4 weeks + eval + 4 wks + 4 wks  PLANNED INTERVENTIONS: [id number]- PT Re-evaluation, 97750- Physical Performance Testing, 97110-Therapeutic exercises, 97530- Therapeutic activity,  02887- Neuromuscular re-education, [phone]- Self Care, [id number]- Gait training, [phone]- Aquatic Therapy, Patient/Family education, Balance training, Stair training, Vestibular training, and DME instructions  PLAN FOR NEXT SESSION:  ASSESS LTGs -D/C?  Expand HEP prn.  Work w/ 2WW vs rollator indoor and outdoors - tight psychiatrist.  Endurance - SciFit >/= level 7.0.  BLE strengthening. Static balance, ankle wts for standing balance tasks - hurdles, therastones, rebounder?    Daved KATHEE Bull, PT, DPT 03/17/2024, 11:47 AM

## 2024-03-24 ENCOUNTER — Ambulatory Visit: Payer: Self-pay | Attending: Neurology | Admitting: Physical Therapy

## 2024-03-24 ENCOUNTER — Encounter: Payer: Self-pay | Admitting: Physical Therapy

## 2024-03-24 DIAGNOSIS — M6281 Muscle weakness (generalized): Secondary | ICD-10-CM | POA: Insufficient documentation

## 2024-03-24 DIAGNOSIS — R2689 Other abnormalities of gait and mobility: Secondary | ICD-10-CM | POA: Insufficient documentation

## 2024-03-24 DIAGNOSIS — R2681 Unsteadiness on feet: Secondary | ICD-10-CM | POA: Diagnosis not present

## 2024-03-24 DIAGNOSIS — R260 Ataxic gait: Secondary | ICD-10-CM | POA: Insufficient documentation

## 2024-03-24 DIAGNOSIS — Z9181 History of falling: Secondary | ICD-10-CM | POA: Insufficient documentation

## 2024-03-24 DIAGNOSIS — R278 Other lack of coordination: Secondary | ICD-10-CM | POA: Diagnosis not present

## 2024-03-24 NOTE — Therapy (Signed)
 OUTPATIENT PHYSICAL THERAPY NEURO TREATMENT - DISCHARGE SUMMARY   Patient Name: Derrick White MRN: [id number] DOB:03-28-1941, 83 y.o., male Today's Date: 03/24/2024   PCP: [name]., MD REFERRING PROVIDER: [name], MD  PHYSICAL THERAPY DISCHARGE SUMMARY  Visits from Start of Care: 14  Current functional level related to goals / functional outcomes: See clinical impression statement.   Remaining deficits: Ataxia, need for rollator for safest ambulation   Education / Equipment: Please work on HEP - can reduce frequency but importance of consistent movement.  Continue going to senior center - he goes once a week.  Plan for discharge today and progress towards goals.  Use rollator full time.     Patient agrees to discharge. Patient goals were partially met. Patient is being discharged due to maximized rehab potential.    END OF SESSION:  PT End of Session - 03/24/24 1111     Visit Number 14    Number of Visits 17   13 + 4   Date for Recertification  04/02/24   pushed out due to potential rescheduling needs of pt at re-cert 89/3   Authorization Type [employer]    Progress Note Due on Visit 10    PT Start Time 1104    PT Stop Time 1144    PT Time Calculation (min) 40 min    Equipment Utilized During Treatment Gait belt    Activity Tolerance Patient tolerated treatment well    Behavior During Therapy WFL for tasks assessed/performed          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 05/04/2019   Memory loss 03/16/2019   Mild cognitive impairment 12/12/2017   Pain due to onychomycosis of toenails of both feet 04/28/2019    Pain in left ankle and joints of left foot 08/06/2018   Slurred speech 03/16/2019   Superficial peroneal nerve neuropathy, 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   TIA (transient ischemic attack) 11/05/2019   Worsened handwriting 03/16/2019   Past Surgical History:  Procedure Laterality Date   APPENDECTOMY     1940s   BACK SURGERY     1981, 1998   CARDIOVERSION N/A 09/13/2020   Procedure: CARDIOVERSION;  Surgeon: [name], MD;  Location: [facility] ENDOSCOPY;  Service: Cardiovascular;  Laterality: N/A;   FRACTURE SURGERY     Left ankle, sx x4   INGUINAL HERNIA REPAIR Right 03/05/2019   Procedure: OPEN RIGHT INGUINAL HERNIA REPAIR WITH MESH;  Surgeon: [name], MD;  Location: [facility] OR;  Service: General;  Laterality: Right;   KNEE SURGERY Left 1985   LEFT ATRIAL APPENDAGE OCCLUSION N/A 07/27/2020   Procedure: LEFT ATRIAL APPENDAGE OCCLUSION;  Surgeon: [name], MD;  Location: [facility] INVASIVE CV LAB;  Service: Cardiovascular;  Laterality: N/A;   SHOULDER SURGERY Left    TEE WITHOUT CARDIOVERSION N/A 07/27/2020   Procedure: TRANSESOPHAGEAL ECHOCARDIOGRAM (TEE);  Surgeon: [name], MD;  Location: [facility] INVASIVE CV LAB;  Service: Cardiovascular;  Laterality: N/A;   TEE WITHOUT CARDIOVERSION N/A 09/13/2020   Procedure: TRANSESOPHAGEAL ECHOCARDIOGRAM (TEE);  Surgeon: [name], MD;  Location: [facility] ENDOSCOPY;  Service: Cardiovascular;  Laterality: N/A;  TONSILLECTOMY     1945   Patient Active Problem List   Diagnosis Date Noted   Pain in right shoulder 07/31/2021   Persistent atrial fibrillation (HCC) 08/28/2020   Secondary hypercoagulable state 07/20/2020   Paroxysmal atrial fibrillation (HCC) 06/19/2020   TIA (transient ischemic attack) 11/05/2019   Essential hypertension 11/05/2019   Cerebellar ataxia (HCC) 11/05/2019   Lumbosacral radiculopathy at S1 05/04/2019   C6 radiculopathy 05/04/2019   Pain due to onychomycosis of toenails of both feet 04/28/2019   Worsened handwriting 03/16/2019    Gait disturbance 03/16/2019   Slurred speech 03/16/2019   Memory loss 03/16/2019   Pain in left ankle and joints of left foot 08/06/2018   Mild cognitive impairment 12/12/2017   Foot pain, left 06/04/2016   Dysesthesia 06/04/2016   Superficial peroneal nerve neuropathy, 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   History of cervical spinal surgery 06/04/2016    ONSET DATE: several years  REFERRING DIAG:  R26.9 (ICD-10-CM) - Gait disturbance  R29.898 (ICD-10-CM) - Weakness of both lower extremities    THERAPY DIAG:  Other abnormalities of gait and mobility  Unsteadiness on feet  Other lack of coordination  Muscle weakness (generalized)  History of falling  Ataxic gait  Rationale for Evaluation and Treatment: Rehabilitation  SUBJECTIVE:                                                                                                                                                                                             SUBJECTIVE STATEMENT:  Pt denies pain.  He presents with rollator.  He states he is doing well today.  He denies recent falls.  Pt accompanied by: self (he drove himself)  PERTINENT HISTORY: C6 radiculopathy, Cerebellar ataxia (HCC), Dysesthesia, Essential hypertension, Foot pain (left), Gait disturbance, History of cervical spinal surgery (06/04/2016), History of hiatal hernia, History of kidney stones, Lumbosacral radiculopathy at S1, Memory loss, Mild cognitive impairment, Pain due to onychomycosis of toenails of both feet, Pain in left ankle and joints of left foot, Slurred speech, Superficial peroneal nerve neuropathy, left (06/04/2009),TIA (transient ischemic attack) 11/05/2019. NCV/EMG study did not show any evidence of motor neuron disease. We did the Athena SCA panel (30+ genes). It was borderline. 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 Unclear if this is playing a role (SCA type 6 has mutations in this [place] but usually triplet  repeat)     PAIN:  Are you having pain? No  PRECAUTIONS: Fall  RED FLAGS: None   WEIGHT BEARING RESTRICTIONS: No  FALLS: Has patient fallen in last 6 months? Yes. Number of falls 2 -  both without rollator/AD  LIVING ENVIRONMENT: Lives with: lives with their spouse Lives in: House/apartment Stairs: Yes: Internal: 12 steps; can reach both and External: 2 steps; can reach both - in back; pt reports he does not access 2nd level of home - has bedroom on lower level Has following equipment at home: [name] - 4 wheeled, walking canes  PLOF: Independent with basic ADLs, Independent with household mobility with device, Independent with community mobility with device, and Needs assistance with homemaking  PATIENT GOALS: to get better, but no specific goal  OBJECTIVE:  Note: Objective measures were completed at Evaluation unless otherwise noted.  DIAGNOSTIC FINDINGS: No recent relevant imaging.  COGNITION: Overall cognitive status: History of cognitive impairments - at baseline   SENSATION: WFL  COORDINATION: Ataxia of BLE  POSTURE: rounded shoulders and forward head  LOWER EXTREMITY ROM:   BLE WFL   LOWER EXTREMITY MMT (tested in seated):    MMT Right Eval Left Eval  Hip flexion 5 5  Hip extension    Hip abduction 4+ 4+  Hip adduction    Hip internal rotation    Hip external rotation    Knee flexion 5 5  Knee extension 5 5  Ankle dorsiflexion 5 5  Ankle plantarflexion    Ankle inversion    Ankle eversion    (Blank rows = not tested)  BED MOBILITY:  Modified independent - sleeps in standard bed w/o added features  TRANSFERS: Assistive device utilized: Environmental Consultant - 4 wheeled  Sit to stand: SBA and cues for using brakes appropriate Stand to sit: SBA and cues for rollator brake use  GAIT: Gait pattern: ataxic Distance walked: various clinic distances ranging from 40-100' Assistive device utilized: Environmental Consultant - 4 wheeled Level of assistance: CGA Comments: needs  cues to maintain BUE on handles as pt distracted in open gym  FUNCTIONAL TESTS:  12/26/2023 EVAL: 5 times sit to stand: 19.34 sec no UE support w/ back of knees on chair CGA, declined edu to use UE for balance and to come completely into upright Timed up and go (TUG): 27.25 sec, significant difficulty turning to sit requiring mod cues to improve approach 10 meter walk test: 14.78 sec w/ rollator CGA w/ increased trunk lean = 0.64m/sec = 2.23 ft/sec  FROM PRIOR EVAL: 5 times sit to stand: 13.57 secs without UE support from mat Timed up and go (TUG): 24.12 secs 10 meter walk test: 18.19 secs = 1.80 ft/sec with RW                                                                                                                              TREATMENT DATE: 03/24/24  5xSTS w/o UE support:  17.65 sec 5xSTS w/ BUE support:  14.66 sec // bars (bilateral 3lb ankle wts donned): 8 step taps progressing to no UE support CGA over several minutes, cues to improve step off Therastones forward and backward w/ BUE support 4x8 ft each direction  working on upright posture Soccer ball kicks alternating LE x40 kicks SciFit multi-peaks mode using BUE/BLE up to level 7.0 for large amplitude reciprocal mobility and dynamic HIIT style workout.  RPE 7/10 following task.  PATIENT EDUCATION: Education details: Please work on HEP - can reduce frequency but importance of consistent movement.  Continue going to senior center - he goes once a week.  Plan for discharge today and progress towards goals.  Use rollator full time.   Person educated: Patient Education method: Explanation, Demonstration, and Verbal cues Education comprehension: verbalized understanding and needs further education  HOME EXERCISE PROGRAM: Access Code: [email or web address] URL: [email or web address] Date: 01/02/2024 Prepared by: Daved Bull  Exercises - Sit to Stand Without Arm Support  - 1 x daily - 7 x weekly - 1 sets - 10 reps -  Standing Hip Flexion with Counter Support  - 1 x daily - 7 x weekly - 1 sets - 10 reps - Standing Hip Extension with Counter Support  - 1 x daily - 7 x weekly - 1 sets - 10 reps - Standing Hip Abduction with Counter Support  - 1 x daily - 7 x weekly - 1 sets - 10 reps - Standing March with Counter Support  - 1 x daily - 7 x weekly - 1 sets - 10 reps - Seated Marching with Opposite Shoulder Flexion  - 1 x daily - 7 x weekly - 1 sets - 10 reps - Single Leg Stance with Support  - 1 x daily - 7 x weekly - 1 sets - 2 reps - 10 sec hold - Step Sideways  - 1 x daily - 7 x weekly - 1 sets - 10 reps  GOALS: Goals reviewed with patient? Yes  SHORT TERM GOALS: SAME AS LTG'S   LONG TERM GOALS: Target date:  01/23/2024  Pt will be independent and compliant with strength and balance focused HEP in order to maintain functional progress and improve mobility. Baseline: IND and compliant (9/3) Goal status: MET  2.  Pt will decrease 5xSTS to </=14.34 seconds using safest method to obtain upright in order to demonstrate decreased risk for falls and improved functional bilateral LE strength and power. Baseline:  19.34 sec no UE support w/ back of knees on chair CGA; 16.18 sec w/o UE support and mild forward trunk lean (9/3) Goal status: PARTIALLY MET  3.  Pt will demonstrate a gait speed of >/=2.43 feet/sec in order to decrease risk for falls. Baseline:  2.23 ft/sec w/ rollator; 2.10 ft/sec w/ rollator (9/3) Goal status: NOT MET  4.  Pt will demonstrate TUG of </=22.25 seconds in order to decrease risk of falls and improve functional mobility using LRAD. Baseline: 27.25 sec w/ rollator; 22.78 sec w/ rollator (9/3) Goal status: PARTIALLY MET  GOALS (at 9/3 re-cert): Goals reviewed with patient? Yes  SHORT TERM GOALS: SAME AS LTG'S   LONG TERM GOALS: Target date:  02/27/2024  Pt will be independent and compliant with strength and balance focused HEP in order to maintain functional progress and improve  mobility. Baseline: IND and compliant (9/3); IND and compliant (10/6) Goal status: MET  2.  Pt will decrease 5xSTS to </=14.34 seconds using safest method to obtain upright in order to demonstrate decreased risk for falls and improved functional bilateral LE strength and power. Baseline:  19.34 sec no UE support w/ back of knees on chair CGA; 16.18 sec w/o UE support and mild forward trunk lean (9/3); 20.06 sec no UE support, intermittent  back of knees on mat surface (10/6) Goal status: NOT MET  3.  Pt will demonstrate a gait speed of >/=2.33 feet/sec in order to decrease risk for falls. Baseline:  2.23 ft/sec w/ rollator; 2.10 ft/sec w/ rollator (9/3); 1.94 ft/sec w/ rollator SBA (10/6) Goal status: NOT MET  4.  Pt will demonstrate TUG of </=19.25 seconds in order to decrease risk of falls and improve functional mobility using LRAD. Baseline: 27.25 sec w/ rollator; 22.78 sec w/ rollator (9/3); 24.69 sec w/ rollator (10/6) Goal status: NOT MET  GOALS (at 89/3 re-cert): Goals reviewed with patient? Yes  SHORT TERM GOALS: SAME AS LTG'S   LONG TERM GOALS: Target date:  03/26/2024  Pt will demonstrate floor recovery using no more than maxA and ability to moderately direct care for +2 assist in order to improve home safety. Baseline: Neighbor has to hoist him up (10/6); +2 modA (10/29) Goal status: MET  2.  Pt will decrease 5xSTS to </=14.34 seconds using safest method to obtain upright in order to demonstrate decreased risk for falls and improved functional bilateral LE strength and power. Baseline:  19.34 sec no UE support w/ back of knees on chair CGA; 16.18 sec w/o UE support and mild forward trunk lean (9/3); 20.06 sec no UE support, intermittent back of knees on mat surface (10/6); 14.66 sec w/ BUE support (11/5) Goal status:  PARTIALLY MET  ASSESSMENT:  CLINICAL IMPRESSION: Patient met floor recovery goal at prior session requiring no more than modA of 2, though he still has some  difficulty directing his care in these scenarios partially due to speak and hearing deficits.  His 5xSTS looks markedly improved in terms of form both with and without UE support though he is more efficient with light BUE support.  His ataxia continues to impact his gait mechanics, but with the rollator he moves more fluidly that alternatives.  He is more compliant per report with rollator though limited access in his bathroom.  Education on continued safety with device to maintain level of independence.  He is in agreement to discharge at this time and may benefit from episodic care in 3-4 months.    OBJECTIVE IMPAIRMENTS: Abnormal gait, decreased balance, decreased coordination, decreased knowledge of condition, decreased knowledge of use of DME, decreased strength, decreased safety awareness, and improper body mechanics.   ACTIVITY LIMITATIONS: carrying, lifting, bending, standing, squatting, transfers, reach over head, and locomotion level  PARTICIPATION LIMITATIONS: cleaning, laundry, shopping, community activity, and yard work  PERSONAL FACTORS: Age, Fitness, Past/current experiences, Time since onset of injury/illness/exacerbation, and 1-2 comorbidities: Cerebellar ataxia, mild cognitive impairment are also affecting patient's functional outcome.   REHAB POTENTIAL: Fair due to progressive nature of disease  CLINICAL DECISION MAKING: Evolving/moderate complexity  EVALUATION COMPLEXITY: Moderate  PLAN:  PT FREQUENCY: 2x/week + 1x/wk + 1x/wk  PT DURATION: 4 weeks + eval + 4 wks + 4 wks  PLANNED INTERVENTIONS: 02835- PT Re-evaluation, 97750- Physical Performance Testing, 97110-Therapeutic exercises, 97530- Therapeutic activity, [id number]- Neuromuscular re-education, [phone]- Self Care, 02883- Gait training, [phone]- Aquatic Therapy, Patient/Family education, Balance training, Stair training, Vestibular training, and DME instructions  PLAN FOR NEXT SESSION:  N/A  Daved KATHEE Bull, PT,  DPT 03/24/2024, 11:50 AM

## 2024-03-25 DIAGNOSIS — H0100A Unspecified blepharitis right eye, upper and lower eyelids: Secondary | ICD-10-CM | POA: Diagnosis not present

## 2024-03-25 DIAGNOSIS — H0100B Unspecified blepharitis left eye, upper and lower eyelids: Secondary | ICD-10-CM | POA: Diagnosis not present

## 2024-03-25 DIAGNOSIS — H02105 Unspecified ectropion of left lower eyelid: Secondary | ICD-10-CM | POA: Diagnosis not present

## 2024-03-25 DIAGNOSIS — H04123 Dry eye syndrome of bilateral lacrimal glands: Secondary | ICD-10-CM | POA: Diagnosis not present

## 2024-04-07 ENCOUNTER — Other Ambulatory Visit: Payer: Self-pay | Admitting: Cardiology

## 2024-04-13 ENCOUNTER — Emergency Department (HOSPITAL_COMMUNITY)

## 2024-04-13 ENCOUNTER — Inpatient Hospital Stay (HOSPITAL_COMMUNITY)

## 2024-04-13 ENCOUNTER — Other Ambulatory Visit: Payer: Self-pay

## 2024-04-13 ENCOUNTER — Inpatient Hospital Stay (HOSPITAL_COMMUNITY)
Admission: EM | Admit: 2024-04-13 | Discharge: 2024-04-21 | DRG: 189 | Disposition: A | Discharge status: Skilled Nursing Facility | Attending: Internal Medicine | Admitting: Internal Medicine

## 2024-04-13 DIAGNOSIS — R918 Other nonspecific abnormal finding of lung field: Secondary | ICD-10-CM | POA: Diagnosis not present

## 2024-04-13 DIAGNOSIS — M51369 Other intervertebral disc degeneration, lumbar region without mention of lumbar back pain or lower extremity pain: Secondary | ICD-10-CM | POA: Diagnosis not present

## 2024-04-13 DIAGNOSIS — S0990XA Unspecified injury of head, initial encounter: Secondary | ICD-10-CM | POA: Diagnosis not present

## 2024-04-13 DIAGNOSIS — J9601 Acute respiratory failure with hypoxia: Secondary | ICD-10-CM | POA: Diagnosis not present

## 2024-04-13 DIAGNOSIS — R0989 Other specified symptoms and signs involving the circulatory and respiratory systems: Secondary | ICD-10-CM | POA: Diagnosis not present

## 2024-04-13 DIAGNOSIS — I517 Cardiomegaly: Secondary | ICD-10-CM | POA: Diagnosis not present

## 2024-04-13 DIAGNOSIS — Z515 Encounter for palliative care: Secondary | ICD-10-CM | POA: Diagnosis not present

## 2024-04-13 DIAGNOSIS — I480 Paroxysmal atrial fibrillation: Secondary | ICD-10-CM | POA: Diagnosis not present

## 2024-04-13 DIAGNOSIS — S199XXA Unspecified injury of neck, initial encounter: Secondary | ICD-10-CM | POA: Diagnosis not present

## 2024-04-13 DIAGNOSIS — M4802 Spinal stenosis, cervical region: Secondary | ICD-10-CM | POA: Diagnosis not present

## 2024-04-13 DIAGNOSIS — R0602 Shortness of breath: Secondary | ICD-10-CM | POA: Diagnosis not present

## 2024-04-13 DIAGNOSIS — R0902 Hypoxemia: Secondary | ICD-10-CM | POA: Diagnosis not present

## 2024-04-13 DIAGNOSIS — S32020A Wedge compression fracture of second lumbar vertebra, initial encounter for closed fracture: Principal | ICD-10-CM | POA: Diagnosis present

## 2024-04-13 DIAGNOSIS — M546 Pain in thoracic spine: Secondary | ICD-10-CM | POA: Diagnosis not present

## 2024-04-13 DIAGNOSIS — R9389 Abnormal findings on diagnostic imaging of other specified body structures: Secondary | ICD-10-CM | POA: Diagnosis not present

## 2024-04-13 DIAGNOSIS — S3992XA Unspecified injury of lower back, initial encounter: Secondary | ICD-10-CM | POA: Diagnosis not present

## 2024-04-13 DIAGNOSIS — I10 Essential (primary) hypertension: Secondary | ICD-10-CM | POA: Diagnosis not present

## 2024-04-13 DIAGNOSIS — M48061 Spinal stenosis, lumbar region without neurogenic claudication: Secondary | ICD-10-CM | POA: Diagnosis not present

## 2024-04-13 DIAGNOSIS — W19XXXA Unspecified fall, initial encounter: Secondary | ICD-10-CM | POA: Diagnosis not present

## 2024-04-13 DIAGNOSIS — K449 Diaphragmatic hernia without obstruction or gangrene: Secondary | ICD-10-CM | POA: Diagnosis not present

## 2024-04-13 DIAGNOSIS — M47816 Spondylosis without myelopathy or radiculopathy, lumbar region: Secondary | ICD-10-CM | POA: Diagnosis not present

## 2024-04-13 DIAGNOSIS — M47812 Spondylosis without myelopathy or radiculopathy, cervical region: Secondary | ICD-10-CM | POA: Diagnosis not present

## 2024-04-13 DIAGNOSIS — M545 Low back pain, unspecified: Secondary | ICD-10-CM | POA: Diagnosis not present

## 2024-04-13 DIAGNOSIS — J439 Emphysema, unspecified: Secondary | ICD-10-CM | POA: Diagnosis not present

## 2024-04-13 DIAGNOSIS — I6782 Cerebral ischemia: Secondary | ICD-10-CM | POA: Diagnosis not present

## 2024-04-13 DIAGNOSIS — R112 Nausea with vomiting, unspecified: Secondary | ICD-10-CM | POA: Diagnosis not present

## 2024-04-13 DIAGNOSIS — I700 Atherosclerosis of aorta: Secondary | ICD-10-CM | POA: Diagnosis not present

## 2024-04-13 DIAGNOSIS — Z7189 Other specified counseling: Secondary | ICD-10-CM | POA: Diagnosis not present

## 2024-04-13 DIAGNOSIS — M4856XA Collapsed vertebra, not elsewhere classified, lumbar region, initial encounter for fracture: Secondary | ICD-10-CM | POA: Diagnosis not present

## 2024-04-13 DIAGNOSIS — G118 Other hereditary ataxias: Secondary | ICD-10-CM | POA: Insufficient documentation

## 2024-04-13 DIAGNOSIS — J9811 Atelectasis: Secondary | ICD-10-CM | POA: Diagnosis not present

## 2024-04-13 DIAGNOSIS — Z981 Arthrodesis status: Secondary | ICD-10-CM | POA: Diagnosis not present

## 2024-04-13 LAB — CBC WITH DIFFERENTIAL/PLATELET
Abs Immature Granulocytes: 0.09 K/uL — ABNORMAL HIGH (ref 0.00–0.07)
Basophils Absolute: 0 K/uL (ref 0.0–0.1)
Basophils Relative: 0 %
Eosinophils Absolute: 0 K/uL (ref 0.0–0.5)
Eosinophils Relative: 0 %
HCT: 36.8 % — ABNORMAL LOW (ref 39.0–52.0)
Hemoglobin: 13 g/dL (ref 13.0–17.0)
Immature Granulocytes: 1 %
Lymphocytes Relative: 9 %
Lymphs Abs: 1.3 K/uL (ref 0.7–4.0)
MCH: 33.2 pg (ref 26.0–34.0)
MCHC: 35.3 g/dL (ref 30.0–36.0)
MCV: 94.1 fL (ref 80.0–100.0)
Monocytes Absolute: 1.3 K/uL — ABNORMAL HIGH (ref 0.1–1.0)
Monocytes Relative: 9 %
Neutro Abs: 10.9 K/uL — ABNORMAL HIGH (ref 1.7–7.7)
Neutrophils Relative %: 81 %
Platelets: 243 K/uL (ref 150–400)
RBC: 3.91 MIL/uL — ABNORMAL LOW (ref 4.22–5.81)
RDW: 12.1 % (ref 11.5–15.5)
WBC: 13.6 K/uL — ABNORMAL HIGH (ref 4.0–10.5)
nRBC: 0 % (ref 0.0–0.2)

## 2024-04-13 LAB — BASIC METABOLIC PANEL WITH GFR
Anion gap: 11 (ref 5–15)
BUN: 20 mg/dL (ref 8–23)
CO2: 21 mmol/L — ABNORMAL LOW (ref 22–32)
Calcium: 9 mg/dL (ref 8.9–10.3)
Chloride: 104 mmol/L (ref 98–111)
Creatinine, Ser: 1.31 mg/dL — ABNORMAL HIGH (ref 0.61–1.24)
GFR, Estimated: 54 mL/min — ABNORMAL LOW (ref >60)
Glucose, Bld: 155 mg/dL — ABNORMAL HIGH (ref 70–99)
Potassium: 3.8 mmol/L (ref 3.5–5.1)
Sodium: 136 mmol/L (ref 135–145)

## 2024-04-13 LAB — URINALYSIS, ROUTINE W REFLEX MICROSCOPIC
Bilirubin Urine: NEGATIVE
Glucose, UA: NEGATIVE mg/dL
Hgb urine dipstick: NEGATIVE
Ketones, ur: 5 mg/dL — AB
Leukocytes,Ua: NEGATIVE
Nitrite: NEGATIVE
Protein, ur: NEGATIVE mg/dL
Specific Gravity, Urine: 1.018 (ref 1.005–1.030)
pH: 5 (ref 5.0–8.0)

## 2024-04-13 LAB — BRAIN NATRIURETIC PEPTIDE: B Natriuretic Peptide: 198 pg/mL — ABNORMAL HIGH (ref 0.0–100.0)

## 2024-04-13 LAB — MAGNESIUM: Magnesium: 1.6 mg/dL — ABNORMAL LOW (ref 1.7–2.4)

## 2024-04-13 MED ORDER — SENNOSIDES-DOCUSATE SODIUM 8.6-50 MG PO TABS: 1.0000 | ORAL_TABLET | Freq: Every evening | ORAL | Status: DC | PRN

## 2024-04-13 MED ORDER — LIDOCAINE HCL URETHRAL/MUCOSAL 2 % EX GEL
1.0000 | CUTANEOUS | Status: AC
  Administered 2024-04-13: 1 via URETHRAL
  Filled 2024-04-13: qty 6

## 2024-04-13 MED ORDER — PROCHLORPERAZINE EDISYLATE 10 MG/2ML IJ SOLN
INTRAMUSCULAR | Status: AC
  Administered 2024-04-13: 10 mg via INTRAVENOUS
  Filled 2024-04-13: qty 2

## 2024-04-13 MED ORDER — ATORVASTATIN CALCIUM 40 MG PO TABS
40.0000 mg | ORAL_TABLET | Freq: Every day | ORAL | Status: DC
  Administered 2024-04-14 – 2024-04-21 (×8): 40 mg via ORAL
  Filled 2024-04-13 (×8): qty 1

## 2024-04-13 MED ORDER — OXYCODONE-ACETAMINOPHEN 5-325 MG PO TABS
1.0000 | ORAL_TABLET | Freq: Once | ORAL | Status: AC
  Administered 2024-04-13: 1 via ORAL
  Filled 2024-04-13: qty 1

## 2024-04-13 MED ORDER — SODIUM CHLORIDE 0.9 % IV SOLN
1.5000 g | Freq: Four times a day (QID) | INTRAVENOUS | Status: DC
  Administered 2024-04-13 – 2024-04-17 (×16): 1.5 g via INTRAVENOUS
  Filled 2024-04-13 (×19): qty 4

## 2024-04-13 MED ORDER — ACETAMINOPHEN 325 MG PO TABS
650.0000 mg | ORAL_TABLET | Freq: Four times a day (QID) | ORAL | Status: DC | PRN
Start: 2024-04-13 — End: 2024-04-14

## 2024-04-13 MED ORDER — PROCHLORPERAZINE EDISYLATE 10 MG/2ML IJ SOLN
10.0000 mg | Freq: Four times a day (QID) | INTRAMUSCULAR | Status: DC | PRN
  Administered 2024-04-19: 10 mg via INTRAVENOUS
  Filled 2024-04-13: qty 2

## 2024-04-13 MED ORDER — ONDANSETRON 4 MG PO TBDP
4.0000 mg | ORAL_TABLET | Freq: Three times a day (TID) | ORAL | Status: DC | PRN
  Administered 2024-04-13: 4 mg via ORAL
  Filled 2024-04-13: qty 1

## 2024-04-13 MED ORDER — IOHEXOL 350 MG/ML SOLN
75.0000 mL | Freq: Once | INTRAVENOUS | Status: AC | PRN
  Administered 2024-04-13: 75 mL via INTRAVENOUS

## 2024-04-13 MED ORDER — ACETAMINOPHEN 650 MG RE SUPP: 650.0000 mg | Freq: Four times a day (QID) | RECTAL | Status: DC | PRN

## 2024-04-13 MED ORDER — FUROSEMIDE 10 MG/ML IJ SOLN
20.0000 mg | Freq: Once | INTRAMUSCULAR | Status: AC
  Administered 2024-04-13: 20 mg via INTRAVENOUS
  Filled 2024-04-13: qty 4

## 2024-04-13 MED ORDER — AMLODIPINE BESYLATE 5 MG PO TABS
2.5000 mg | ORAL_TABLET | Freq: Two times a day (BID) | ORAL | Status: DC
  Administered 2024-04-13: 2.5 mg via ORAL
  Filled 2024-04-13: qty 1

## 2024-04-13 MED ORDER — AMLODIPINE BESYLATE 5 MG PO TABS
2.5000 mg | ORAL_TABLET | Freq: Two times a day (BID) | ORAL | Status: DC
Start: 2024-04-14 — End: 2024-04-15
  Administered 2024-04-14 (×2): 2.5 mg via ORAL
  Filled 2024-04-13 (×2): qty 1

## 2024-04-13 MED ORDER — MAGNESIUM SULFATE 2 GM/50ML IV SOLN
2.0000 g | Freq: Once | INTRAVENOUS | Status: AC
  Administered 2024-04-13: 2 g via INTRAVENOUS
  Filled 2024-04-13: qty 50

## 2024-04-13 MED ORDER — ASPIRIN 81 MG PO TBEC
81.0000 mg | DELAYED_RELEASE_TABLET | Freq: Every day | ORAL | Status: DC
  Administered 2024-04-13 – 2024-04-21 (×9): 81 mg via ORAL
  Filled 2024-04-13 (×9): qty 1

## 2024-04-13 MED ORDER — ENOXAPARIN SODIUM 40 MG/0.4ML IJ SOSY
40.0000 mg | PREFILLED_SYRINGE | INTRAMUSCULAR | Status: DC
  Administered 2024-04-13 – 2024-04-20 (×8): 40 mg via SUBCUTANEOUS
  Filled 2024-04-13 (×8): qty 0.4

## 2024-04-13 MED ORDER — BISACODYL 5 MG PO TBEC: 5.0000 mg | DELAYED_RELEASE_TABLET | Freq: Every day | ORAL | Status: DC | PRN

## 2024-04-13 MED ORDER — CHLORHEXIDINE GLUCONATE CLOTH 2 % EX PADS
6.0000 | MEDICATED_PAD | Freq: Every day | CUTANEOUS | Status: DC
  Administered 2024-04-14 – 2024-04-21 (×8): 6 via TOPICAL

## 2024-04-13 MED ORDER — IPRATROPIUM-ALBUTEROL 0.5-2.5 (3) MG/3ML IN SOLN
3.0000 mL | Freq: Once | RESPIRATORY_TRACT | Status: DC
  Filled 2024-04-13: qty 3

## 2024-04-13 MED ORDER — OXYCODONE-ACETAMINOPHEN 5-325 MG PO TABS
1.0000 | ORAL_TABLET | Freq: Four times a day (QID) | ORAL | Status: DC | PRN
  Administered 2024-04-13 (×2): 1 via ORAL
  Filled 2024-04-13 (×2): qty 1

## 2024-04-13 MED ORDER — AMIODARONE HCL 200 MG PO TABS
200.0000 mg | ORAL_TABLET | Freq: Every day | ORAL | Status: DC
  Administered 2024-04-13: 200 mg via ORAL
  Filled 2024-04-13 (×2): qty 1

## 2024-04-13 MED ORDER — PANTOPRAZOLE SODIUM 40 MG PO TBEC
40.0000 mg | DELAYED_RELEASE_TABLET | Freq: Every morning | ORAL | Status: DC
  Administered 2024-04-13: 40 mg via ORAL
  Filled 2024-04-13: qty 1

## 2024-04-13 MED ORDER — SODIUM CHLORIDE 0.9% FLUSH
3.0000 mL | Freq: Two times a day (BID) | INTRAVENOUS | Status: DC
  Administered 2024-04-13 – 2024-04-21 (×14): 3 mL via INTRAVENOUS

## 2024-04-13 NOTE — NC FL2 (Signed)
 [place]  MEDICAID FL2 LEVEL OF CARE FORM     IDENTIFICATION  Patient Name: Derrick White Birthdate: [birth year] Sex: male Admission Date (Current Location): 04/13/2024  [facility] and [place] Number:  Producer, Television/film/video and Address:  [facility] [place], [address] [place], [place] [address]      Provider Number: [id number]  Attending Physician Name and Address:  Charlyn Sora, MD  Relative Name and Phone Number:  Elaine Loose (Daughter)  [phone] [facility])    Current Level of Care: Hospital Recommended Level of Care: Skilled Nursing Facility Prior Approval Number:    Date Approved/Denied:   PASRR Number: [id number]  Discharge Plan: SNF    Current Diagnoses: Patient Active Problem List   Diagnosis Date Noted   Pain in right shoulder 07/31/2021   Persistent atrial fibrillation (HCC) 08/28/2020   Secondary hypercoagulable state 07/20/2020   Paroxysmal atrial fibrillation (HCC) 06/19/2020   TIA (transient ischemic attack) 11/05/2019   Essential hypertension 11/05/2019   Cerebellar ataxia (HCC) 11/05/2019   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 05/04/2019   C6 radiculopathy 05/04/2019   Pain due to onychomycosis of toenails of both feet 04/28/2019   Worsened handwriting 03/16/2019   Gait disturbance 03/16/2019   Slurred speech 03/16/2019   Memory loss 03/16/2019   Pain in left ankle and joints of left foot 08/06/2018   Mild cognitive impairment 12/12/2017   Foot pain, left 06/04/2016   Dysesthesia 06/04/2016   Superficial peroneal nerve neuropathy, 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   History of cervical spinal surgery 06/04/2016    Orientation RESPIRATION BLADDER Height & Weight     Self, Time, Situation, Place  O2 (3L O2) Continent Weight: 165 lb (74.8 kg) Height:  5' 10 (177.8 cm)  BEHAVIORAL SYMPTOMS/MOOD NEUROLOGICAL BOWEL NUTRITION STATUS      Continent Diet (see dc summary)  AMBULATORY STATUS COMMUNICATION OF NEEDS Skin   Limited Assist  Verbally Normal                       Personal Care Assistance Level of Assistance  Dressing, Feeding, Bathing Bathing Assistance: Limited assistance Feeding assistance: Limited assistance Dressing Assistance: Limited assistance     Functional Limitations Info  Speech, Hearing, Sight Sight Info: Adequate Hearing Info: Adequate Speech Info: Adequate    SPECIAL CARE FACTORS FREQUENCY  PT (By licensed PT), OT (By licensed OT)     PT Frequency: 5x wk OT Frequency: 5x/wk            Contractures Contractures Info: Not present    Additional Factors Info  Code Status, Allergies Code Status Info: Full code Allergies Info: Morphine  Codeine  Propoxyphene  Shrimp (Shellfish Allergy)           Current Medications (04/13/2024):  This is the current hospital active medication list Current Facility-Administered Medications  Medication Dose Route Frequency Provider Last Rate Last Admin   amiodarone  (PACERONE ) tablet 200 mg  200 mg Oral Daily Nanavati, Ankit, MD       amLODipine  (NORVASC ) tablet 2.5 mg  2.5 mg Oral BID Charlyn, Ankit, MD       aspirin  EC tablet 81 mg  81 mg Oral Daily Nanavati, Ankit, MD       oxyCODONE -acetaminophen  (PERCOCET/ROXICET) 5-325 MG per tablet 1 tablet  1 tablet Oral Q6H PRN Bari Charmaine FALCON, MD   1 tablet at 04/13/24 0513   pantoprazole  (PROTONIX ) EC tablet 40 mg  40 mg Oral q AM Charlyn Sora, MD  Current Outpatient Medications  Medication Sig Dispense Refill   acetaminophen  (TYLENOL ) 500 MG tablet Take 500 mg by mouth every 6 (six) hours as needed for moderate pain or headache.     amiodarone  (PACERONE ) 200 MG tablet TAKE 1 TABLET BY MOUTH EVERY DAY 15 tablet 0   amLODipine  (NORVASC ) 2.5 MG tablet TAKE 1 TABLET BY MOUTH 2 TIMES A DAY 180 tablet 1   aspirin  EC 81 MG tablet Take 1 tablet (81 mg total) by mouth daily. Swallow whole. 90 tablet 3   atorvastatin  (LIPITOR) 40 MG tablet Take 1 tablet (40 mg total) by mouth daily. 90 tablet 3    Cholecalciferol 25 MCG (1000 UT) tablet Take 1,000 Units by mouth at bedtime.     diphenhydramine-acetaminophen  (TYLENOL  PM) 25-500 MG TABS tablet Take 1 tablet by mouth at bedtime as needed (sleep).     fluticasone  (FLONASE ) 50 MCG/ACT nasal spray Place 1 spray into both nostrils daily as needed for allergies or rhinitis.     pantoprazole  (PROTONIX ) 40 MG tablet Take 40 mg by mouth in the morning.  2   Probiotic Product (ALIGN) 4 MG CAPS Take by mouth in the morning.     Propylene Glycol (SYSTANE BALANCE) 0.6 % SOLN Place 1 drop into both eyes 2 (two) times daily as needed (burning).     vitamin B-12 (CYANOCOBALAMIN) 1000 MCG tablet Take 1,000 mcg by mouth at bedtime.     vitamin E 180 MG (400 UNITS) capsule Take 400 Units by mouth at bedtime.       Discharge Medications: Please see discharge summary for a list of discharge medications.  Relevant Imaging Results:  Relevant Lab Results:   Additional Information SSN [id number]  Sheri ONEIDA Sharps, LCSW

## 2024-04-13 NOTE — ED Notes (Addendum)
 MD notified of bladder scan , Pt refused foley catheter, pt educated and continues to refuse stating I prefer to do it the old fashioned way and just go when I need to, pt A&Ox4 and MD notified. Condom catheter placed per pt request.

## 2024-04-13 NOTE — Progress Notes (Addendum)
 SNF ref faxed, awaiting bed offers.  Update- Bed choice [facility], ins auth pending

## 2024-04-13 NOTE — Progress Notes (Signed)
 Orthopedic Tech Progress Note Patient Details:  Derrick White [birth year] [id number] Adjusted brace and left at bedside for patient. Pt said pain was 25/10 and wanted pain medicine asap Ortho Devices Type of Ortho Device: Thoracolumbar corset (TLSO) Ortho Device/Splint Location: Back Ortho Device/Splint Interventions: Ordered, Adjustment      Ninoshka Wainwright A Natika Geyer 04/13/2024, 8:56 AM

## 2024-04-13 NOTE — H&P (Addendum)
 History and Physical    DANI DANIS FMW:[id number] DOB: 10/21/1940 DOA: 04/13/2024  PCP: [name], MD  Patient coming from: Home  I have personally briefly reviewed patient's old medical records in [facility]  Chief Complaint: Lower back pain after a fall  HPI: Derrick White is a 83 y.o. 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  Patient normally uses a rollator to ambulate.  He lost his balance at home and fell onto the carpeted floor.  He began to have middle lower back pain after the fall and was brought to the ED for further evaluation.    He denies chest pain or dyspnea.  He reports chronic cough, dysphagia, and dysarthria related to his spinocerebellar ataxia.  After receiving several doses of Percocet in the ED he developed nausea and vomiting.  ED Course  Labs/Imaging on admission: I have personally reviewed following labs and imaging studies.  Initial vitals showed BP 131/69, pulse 72, RR 16, temp 98.5 F, SpO2 89% on room air.  Patient placed on 2 L supplemental O2 via [place] with improvement to 94-95%.  Labs showed WBC 13.6, hemoglobin 13.0, platelets 243, sodium 136, potassium 3.8, bicarb 21, BUN 20, creatinine 1.31, serum glucose 155, BNP 198, magnesium  1.6.  UA negative for UTI.  Lumbar spine x-ray showed likely acute L2 compression fracture with approximately 40% anterior height loss, compatible with recent trauma.  Mild anterolisthesis of L4 on L5 with mild facet hypertrophic and osteophytic changes noted.  Thoracic spine x-ray showed degenerative change without acute abnormality.  CT head without contrast negative for acute intracranial normality.  CT cervical spine without contrast negative for acute osseous abnormality.  Anterior fusion hardware C3-C6,  moderate disc space narrowing at C2-C3 and advanced to space narrowing C6-C7 noted.  CTA chest negative for evidence of PE.  Cardiomegaly, mild emphysema without acute airspace disease noted.  Hyperdense appearing liver parenchyma.  Small hiatal hernia with mild diffuse fluid-filled mid to distal esophagus noted.  Patient was given IV Lasix  20 mg, IV magnesium  2 g, Percocet 5-325 mg x 4.  TLSO brace was placed.  Due to persistent new hypoxia the hospitalist service was consulted for admission.  Review of Systems: All systems reviewed and are negative except as documented in history of present illness above.   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 05/04/2019   Memory loss 03/16/2019   Mild cognitive impairment 12/12/2017   Pain due to onychomycosis of toenails of both feet 04/28/2019   Pain in left ankle and joints of left foot 08/06/2018   Slurred speech 03/16/2019   Superficial peroneal nerve neuropathy, 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   TIA (transient ischemic attack) 11/05/2019   Worsened handwriting 03/16/2019    Past Surgical History:  Procedure Laterality Date   APPENDECTOMY     1940s   BACK SURGERY     1981, 1998   CARDIOVERSION N/A 09/13/2020   Procedure: CARDIOVERSION;  Surgeon: [name], MD;  Location: [facility] ENDOSCOPY;  Service: Cardiovascular;  Laterality: N/A;   FRACTURE SURGERY     Left ankle, sx x4   INGUINAL HERNIA REPAIR Right 03/05/2019   Procedure: OPEN RIGHT INGUINAL HERNIA REPAIR WITH MESH;  Surgeon: [name], MD;  Location: [facility] OR;  Service: General;  Laterality: Right;   KNEE SURGERY Left 1985   LEFT ATRIAL APPENDAGE OCCLUSION N/A  07/27/2020   Procedure: LEFT ATRIAL APPENDAGE OCCLUSION;  Surgeon: [name], MD;  Location: [facility] INVASIVE CV LAB;  Service: Cardiovascular;  Laterality: N/A;   SHOULDER SURGERY Left    TEE WITHOUT CARDIOVERSION N/A 07/27/2020   Procedure: TRANSESOPHAGEAL ECHOCARDIOGRAM (TEE);  Surgeon: [name], MD;  Location: [facility] INVASIVE CV LAB;  Service: Cardiovascular;  Laterality: N/A;   TEE WITHOUT CARDIOVERSION N/A 09/13/2020   Procedure: TRANSESOPHAGEAL ECHOCARDIOGRAM (TEE);  Surgeon: [name], MD;  Location: [facility]OSCOPY;  Service: Cardiovascular;  Laterality: N/A;   TONSILLECTOMY     1945    Social History: Social History   Tobacco Use   Smoking status: Former    Current packs/day: 0.00    Average packs/day: 1 pack/day for 30.0 years (30.0 ttl pk-yrs)    Types: Cigarettes    Start date: 43    Quit date: 1995    Years since quitting: 30.9   Smokeless tobacco: Never  Vaping Use   Vaping status: Never Used  Substance Use Topics   Alcohol  use: Yes    Alcohol /week: 16.0 standard drinks of alcohol     Types: 14 Cans of beer, 2 Standard drinks or equivalent per week    Comment: 2 beers a day   Drug use: Never   Allergies  Allergen Reactions   Morphine Hives and Itching   Codeine Hives and Itching    Other reaction(s): bad feeling   Propoxyphene Itching    DARVOCET   Shrimp [Shellfish Allergy] Rash    Family History  Problem Relation Age of Onset   Stroke Neg Hx      Prior to Admission medications   Medication Sig Start Date End Date Taking? Authorizing Provider  acetaminophen  (TYLENOL ) 500 MG tablet Take 500 mg by mouth every 6 (six) hours as needed for moderate pain or headache.   Yes [provider]  amiodarone  (PACERONE ) 200 MG tablet TAKE 1 TABLET BY MOUTH EVERY DAY 04/08/24  Yes Cindie Ole DASEN, MD  amLODipine  (NORVASC ) 2.5 MG tablet TAKE 1 TABLET BY MOUTH 2 TIMES A DAY 09/16/23  Yes Okey Vina GAILS, MD  aspirin  EC 81 MG tablet Take 1 tablet (81 mg  total) by mouth daily. Swallow whole. 01/25/21  Yes Cindie Ole DASEN, MD  atorvastatin  (LIPITOR) 40 MG tablet Take 1 tablet (40 mg total) by mouth daily. 05/28/23  Yes Okey Vina GAILS, MD  Cholecalciferol 25 MCG (1000 UT) tablet Take 1,000 Units by mouth at bedtime.   Yes [provider]  diphenhydramine-acetaminophen  (TYLENOL  PM) 25-500 MG TABS tablet Take 1 tablet by mouth at bedtime as needed (sleep).   Yes [provider]  fluticasone  (FLONASE ) 50 MCG/ACT nasal spray Place 1 spray into both nostrils daily as needed for allergies or rhinitis. 07/10/20  Yes [provider]  oxybutynin (DITROPAN-XL) 10 MG 24 hr tablet Take 10 mg by mouth daily. 02/19/24  Yes [provider]  pantoprazole  (PROTONIX ) 40 MG tablet Take 40 mg by mouth in the morning. 11/27/14  Yes [provider]  Probiotic Product (ALIGN) 4 MG CAPS Take by mouth in the morning.  Yes [provider]  Propylene Glycol (SYSTANE BALANCE) 0.6 % SOLN Place 1 drop into both eyes 2 (two) times daily as needed (burning).   Yes [provider]  vitamin B-12 (CYANOCOBALAMIN) 1000 MCG tablet Take 1,000 mcg by mouth at bedtime. Patient not taking: Reported on 04/13/2024    [provider]  vitamin E 180 MG (400 UNITS) capsule Take 400 Units by mouth at bedtime. Patient not taking: Reported on 04/13/2024    [provider]    Physical Exam: Vitals:   04/13/24 1205 04/13/24 1208 04/13/24 2040 04/13/24 2106  BP:   (!) 125/51 139/64  Pulse:   75 83  Resp:   19   Temp:    98.4 F (36.9 C)  TempSrc:    Oral  SpO2: (!) 86% 94% 94%   Weight:      Height:       Constitutional: Chronically ill-appearing man resting in bed.  NAD, answering questions appropriately. Eyes: EOMI, lids and conjunctivae normal ENMT: Mucous membranes are moist. Posterior pharynx clear of any exudate or lesions.Normal dentition.  Neck: normal, supple, no masses. Respiratory: clear to  auscultation bilaterally, no wheezing, no crackles. Normal respiratory effort while on 4 L O2 [place]. No accessory muscle use.  Cardiovascular: Regular rate and rhythm, no murmurs / rubs / gallops. No extremity edema. 2+ pedal pulses. Abdomen: Suprapubic tenderness, no masses palpated. Musculoskeletal: no clubbing / cyanosis. No joint deformity upper and lower extremities.  Strength somewhat limited bilateral lower extremities due to eliciting lumbar pain from L2 fracture Skin: no rashes, lesions, ulcers. No induration Neurologic: Sensation intact.  Strength somewhat limited bilateral lower extremities due to eliciting lumbar pain from L2 fracture Psychiatric: Normal judgment and insight. Alert and oriented x 3. Normal mood.   EKG: Personally reviewed. Sinus rhythm, rate 75, first-degree AV block.  Assessment/Plan Principal Problem:   Acute respiratory failure with hypoxia (HCC) Active Problems:   Closed compression fracture of L2 lumbar vertebra, initial encounter (HCC)   Essential hypertension   Spinocerebellar ataxia (HCC)   Paroxysmal atrial fibrillation (HCC)   Derrick White is a 83 y.o. 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  Assessment and Plan: Acute respiratory failure with hypoxia: Patient with new hypoxia likely multifactorial from narcotic medication effect and suspected aspiration with ongoing nausea and vomiting.  Has been requiring 2-4 L supplemental O2 via [place] at time of admission. - Start IV Unasyn  - Continue supplemental O2 and wean as able - Incentive spirometer, flutter valve - Remain n.p.o. tonight, continue aspiration precautions - CXR in a.m. - SLP eval in a.m.  Acute L2 compression fracture: Occurring after an apparent mechanical  fall at home.  PT/OT eval.  Urinary retention: 900 mLs on bladder scan, Foley catheter placed on admission.  UA was negative for UTI.  Hold oxybutynin and continue Foley care.  Paroxysmal atrial fibrillation s/p Watchman: Remains in sinus rhythm with controlled rate.  Not on chronic anticoagulation due to frequent fall risk.  Continue aspirin  and amiodarone .  Of note, incidental finding of hyperdense appearing liver parenchyma was noted on CT imaging which could be associated with amiodarone  use.  Will check LFTs in AM.  Hypertension: Continue amlodipine .  Hypomagnesemia: IV supplement given in the ED.  Recheck labs in AM.  Hyperlipidemia: Continue atorvastatin .  Spinocerebellar ataxia: Associated with chronic dysphagia, dysarthria, and functional debility.  Continue PT/OT eval.   DVT prophylaxis: enoxaparin  (LOVENOX ) injection 40 mg Start: 04/13/24 2215 Code Status: Full code Family Communication: Spouse at bedside Disposition Plan: From home, dispo pending clinical progress Consults called: None Severity of Illness: The appropriate patient status for this patient is INPATIENT. Inpatient status is judged to be reasonable and necessary in order to provide the required intensity of service to ensure the patient's safety. The patient's presenting symptoms, physical exam findings, and initial radiographic and laboratory data in the context of their chronic comorbidities is felt to place them at high risk for further clinical deterioration. Furthermore, it is not anticipated that the patient will be medically stable for discharge from the hospital within 2 midnights of admission.   * I certify that at the point of admission it is my clinical judgment that the patient will require inpatient hospital care spanning beyond 2 midnights from the point of admission due to high intensity of service, high risk for further deterioration and high frequency of surveillance required.DEWAINE Jorie Blanch  MD [facility]  If 7PM-7AM, please contact night-coverage [email or web address]  04/13/2024, 9:39 PM

## 2024-04-13 NOTE — ED Notes (Signed)
 Pt nauseous, vomiting. Changed gown and removed undershirt. Pt repositioned in bed. Tolerated well.

## 2024-04-13 NOTE — ED Notes (Signed)
 Dr. Melvenia at bedside assessing patient and updating patient and spouse on plan of care.

## 2024-04-13 NOTE — ED Provider Notes (Addendum)
  Physical Exam  BP 124/60   Pulse (!) 59   Temp 98.3 F (36.8 C) (Oral)   Resp 16   Ht 5' 10 (1.778 m)   Wt 74.8 kg   SpO2 93%   BMI 23.68 kg/m   Physical Exam  Procedures  Procedures  ED Course / MDM   Clinical Course as of 04/13/24 0728  Tue Apr 13, 2024  0229 Daughter is at the bedside.  Advised of T2 compression fracture.  Daughter reports that he does not even walk with his rollator at home.  She states that he holds onto the wall and is not very independent.  He relies heavily on her mother who is just here for fall as well.  She is concerned about his ability to go home with a new fracture.  He has been very resistant to any help at home or placement.  Will place TLSO brace and have him evaluated by PT in the morning with transition of care to help facilitate home health needs. [CH]  [id number] Chest x-ray and urinalysis without any evidence of infection.  Does have a slightly elevated white count without obvious source of infection.  Will have TOC and PT evaluate. [CH]    Clinical Course User Index [CH] Horton, Charmaine FALCON, MD   Medical Decision Making Amount and/or Complexity of Data Reviewed Labs: ordered. Radiology: ordered.  Risk OTC drugs. Prescription drug management.   Pt comes in with cc of fall. Has L2 compression fracture. Pt has ataxia at baseline and poor function. His wife also fell, who is unable to help any further. Daughter and family willing to understand options to help patient do better.  TOC/PT consult in.    Charlyn Sora, MD 04/13/24 0730   1:11 PM When TOC communicated with the family, they have requested rehab admission. Patient has TOC hold at this time.  FL 2 has been signed.  Home meds ordered.   Charlyn Sora, MD 04/13/24 1312  3:50 PM Nursing staff instructed that patient has new oxygen requirement. I will order CT angio PE.  Patient does not have any asthma or COPD history.  On x-ray, there appears to be some pulmonary  vascular congestion.  Patient did not mention any shortness of breath when I saw him.  His main complaint was back pain.  If the CT PE is negative, and there is no evidence of profound volume overload, then we can discharge patient with oxygen.  If there is profound pulmonary edema, then he will need CHF TAVR workup as well. Patient has known history of paroxysmal A-fib.  Last echo from 2022 showed preserved EF.   Dr. Melvenia will follow-up on the CT PE.  Charlyn Sora, MD 04/13/24 1551    Charlyn Sora, MD 04/13/24 [phone]

## 2024-04-13 NOTE — ED Notes (Signed)
 Pt placed on 2L of O2 via nasal cannula and sats increased to 95%. EMS cervical collar replacedc with [place] J collar because pt unable to breathe with ill-fitting EMS collar.

## 2024-04-13 NOTE — Evaluation (Signed)
 Physical Therapy Evaluation Patient Details Name: Derrick White MRN: [id number] DOB: 1940-07-17 Today's Date: 04/13/2024  History of Present Illness  Pt is 83 yo presenting to [facility] on 11/25 due to a fall. Found to be concerning per MD note for L2 compression fracture. 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  Clinical Impression  Currently pt is Mod A to Total A for bed mobility, Min A from elevated surface with RW for sit to stand and Min A to CGA for very short distance gait with RW. Initially pt was CGA but became weak and required increased assist to safely get to sitting reporting fatigue. Pt BP decreased after gait. Pt spouse present and supportive. Due to pt current functional status, home set up and available assistance at home recommending skilled physical therapy services < 3 hours/day in order to address strength, balance and functional mobility to decrease risk for falls, injury, immobility, skin break down and re-hospitalization.          If plan is discharge home, recommend the following: A little help with walking and/or transfers;Assistance with cooking/housework;Assist for transportation;Help with stairs or ramp for entrance   Can travel by private vehicle   No    Equipment Recommendations Rolling walker (2 wheels);Wheelchair (measurements PT);Wheelchair cushion (measurements PT);BSC/3in1  Recommendations for Other Services  OT consult    Functional Status Assessment Patient has had a recent decline in their functional status and demonstrates the ability to make significant improvements in function in a reasonable and predictable amount of time.     Precautions / Restrictions Precautions Precautions: Fall;Back Precaution Booklet Issued:  No Recall of Precautions/Restrictions: Impaired Required Braces or Orthoses: Spinal Brace Spinal Brace: Thoracolumbosacral orthotic Restrictions Weight Bearing Restrictions Per Provider Order: No      Mobility  Bed Mobility Overal bed mobility: Needs Assistance Bed Mobility: Supine to Sit, Sit to Supine     Supine to sit: Mod assist Sit to supine: Total assist   General bed mobility comments: Mod A for trunk to midline. pt guided through log roll with step by step directions. TOtal A for back to bed due to dizziness and difficulty maneuvering on the stretcher.    Transfers Overall transfer level: Needs assistance Equipment used: Rolling walker (2 wheels) Transfers: Sit to/from Stand Sit to Stand: Min assist, From elevated surface           General transfer comment: Min A for rising from sitting with multi modal cues for safe hand placement to push up from stretcher.    Ambulation/Gait Ambulation/Gait assistance: Min assist, Contact guard assist Gait Distance (Feet): 25 Feet Assistive device: Rolling walker (2 wheels) Gait Pattern/deviations: Shuffle, Festinating, Step-through pattern, Step-to pattern Gait velocity: decreased Gait velocity interpretation: <1.31 ft/sec, indicative of household ambulator   General Gait Details: uneven, step pattern, did not demonstrate ataxic steps this session mostly shuffling; though history of ataxia. Very short step to/partial step through gait pattern. Difficulty improving with demonstration and verbal cues for larger steps. Pt did improve with cues for sequencing to decrease pain on the L.     Balance Overall balance assessment: Needs assistance Sitting-balance support: Bilateral upper extremity supported, Feet supported, Feet unsupported Sitting balance-Leahy Scale: Poor Sitting balance - Comments: CGA to Min A with dizziness after gait.   Standing balance support: Bilateral upper extremity supported,  Reliant on assistive device  for balance, During functional activity Standing balance-Leahy Scale: Poor Standing balance comment: CGa to Min a with short distance gait. Pt became very weak requiring increased assist in standing       Pertinent Vitals/Pain Pain Assessment Pain Assessment: Faces Faces Pain Scale: Hurts little more Pain Location: LB Pain Descriptors / Indicators: Aching, Discomfort Pain Intervention(s): Monitored during session, Limited activity within patient's tolerance    Home Living Family/patient expects to be discharged to:: Private residence Living Arrangements: Spouse/significant other Available Help at Discharge: Family;Available PRN/intermittently Type of Home: House Home Access: Stairs to enter Entrance Stairs-Rails: Right;Left;Can reach both Entrance Stairs-Number of Steps: 3 Alternate Level Stairs-Number of Steps: flight Home Layout: Two level;Able to live on main level with bedroom/bathroom Home Equipment: Rollator (4 wheels);Cane - single point;Grab bars - tub/shower;Shower seat Additional Comments: has rollator but does not use it. States he uses cane some.    Prior Function Prior Level of Function : Independent/Modified Independent;History of Falls (last six months)       Mobility Comments: has had falls unclear how many. REports he is a wall walker. ADLs Comments: Spouse provide set up from conversation for most ADLs and assist with IADL's.     Extremity/Trunk Assessment   Upper Extremity Assessment Upper Extremity Assessment: Defer to OT evaluation    Lower Extremity Assessment Lower Extremity Assessment: Generalized weakness       Communication   Communication Communication: Impaired Factors Affecting Communication: Difficulty expressing self    Cognition Arousal: Alert Behavior During Therapy: WFL for tasks assessed/performed   PT - Cognitive impairments: History of cognitive impairments, Problem solving, Memory, Attention, Safety/Judgement      Following commands: Intact       Cueing Cueing Techniques: Verbal cues     General Comments General comments (skin integrity, edema, etc.): BP 126/74 before session. After gait BP 106/63 in supine due to pt reporting he is very dizzy and needs to lay down. O2 sats on room air with gait 86%. On 4L 99% pt on 3L RN notified.        Assessment/Plan    PT Assessment Patient needs continued PT services  PT Problem List Decreased strength;Decreased balance;Decreased mobility;Decreased activity tolerance;Decreased safety awareness;Pain       PT Treatment Interventions DME instruction;Functional mobility training;Gait training;Therapeutic activities;Stair training;Therapeutic exercise;Neuromuscular re-education;Balance training;Patient/family education;Wheelchair mobility training    PT Goals (Current goals can be found in the Care Plan section)  Acute Rehab PT Goals Patient Stated Goal: to improve mobility and return home. PT Goal Formulation: With patient/family Time For Goal Achievement: 04/27/24 Potential to Achieve Goals: Fair    Frequency Min 2X/week        AM-PAC PT 6 Clicks Mobility  Outcome Measure Help needed turning from your back to your side while in a flat bed without using bedrails?: A Lot Help needed moving from lying on your back to sitting on the side of a flat bed without using bedrails?: A Lot Help needed moving to and from a bed to a chair (including a wheelchair)?: A Lot Help needed standing up from a chair using your arms (e.g., wheelchair or bedside chair)?: A Little Help needed to walk in hospital room?: A Lot Help needed climbing 3-5 steps with a railing? : Total 6 Click Score: 12    End of Session Equipment Utilized During Treatment: Gait belt;Back brace Activity Tolerance: Patient tolerated treatment well;Patient limited by fatigue Patient left: in bed;with family/visitor present Nurse Communication: Mobility status;Other (  comment) (BP and O2  sats) PT Visit Diagnosis: Unsteadiness on feet (R26.81);Other abnormalities of gait and mobility (R26.89);History of falling (Z91.81);Muscle weakness (generalized) (M62.81);Pain Pain - part of body:  (back)    Time: 8984-8944 PT Time Calculation (min) (ACUTE ONLY): 40 min   Charges:   PT Evaluation $PT Eval Low Complexity: 1 Low PT Treatments $Gait Training: 8-22 mins $Therapeutic Activity: 8-22 mins PT General Charges $$ ACUTE PT VISIT: 1 Visit         Dorothyann Maier, DPT, CLT  Acute Rehabilitation Services Office: [phone] (Secure chat preferred)   Dorothyann VEAR Maier 04/13/2024, 11:09 AM

## 2024-04-13 NOTE — ED Provider Notes (Signed)
 [place] EMERGENCY DEPARTMENT AT [facility] Provider Note   CSN: [id number] Arrival date & time: 04/13/24  9983     Patient presents with: Fall   Derrick White is a 83 y.o. male.   HPI     This is an 83 year old male who presents after fall.  Patient reports tripping and falling on the carpet.  He states he fell backward onto his buttock.  He normally uses a rollator because of problems with balance and ataxia.  Denies hitting his head or loss of consciousness.  He is on aspirin .  Complaining mostly of lower back pain.  Was not able to get up but was able to crawl to get help.  Prior to Admission medications   Medication Sig Start Date End Date Taking? Authorizing Provider  acetaminophen  (TYLENOL ) 500 MG tablet Take 500 mg by mouth every 6 (six) hours as needed for moderate pain or headache.    [provider]  amiodarone  (PACERONE ) 200 MG tablet TAKE 1 TABLET BY MOUTH EVERY DAY 04/08/24   Cindie Ole DASEN, MD  amLODipine  (NORVASC ) 2.5 MG tablet TAKE 1 TABLET BY MOUTH 2 TIMES A DAY 09/16/23   Okey Vina GAILS, MD  aspirin  EC 81 MG tablet Take 1 tablet (81 mg total) by mouth daily. Swallow whole. 01/25/21   Cindie Ole DASEN, MD  atorvastatin  (LIPITOR) 40 MG tablet Take 1 tablet (40 mg total) by mouth daily. 05/28/23   Okey Vina GAILS, MD  Cholecalciferol 25 MCG (1000 UT) tablet Take 1,000 Units by mouth at bedtime.    [provider]  diphenhydramine-acetaminophen  (TYLENOL  PM) 25-500 MG TABS tablet Take 1 tablet by mouth at bedtime as needed (sleep).    [provider]  fluticasone  (FLONASE ) 50 MCG/ACT nasal spray Place 1 spray into both nostrils daily as needed for allergies or rhinitis. 07/10/20   [provider]  pantoprazole  (PROTONIX ) 40 MG tablet Take 40 mg by mouth in the morning. 11/27/14   [provider]  Probiotic Product (ALIGN) 4 MG CAPS Take by mouth in the morning.    [provider]  Propylene Glycol (SYSTANE  BALANCE) 0.6 % SOLN Place 1 drop into both eyes 2 (two) times daily as needed (burning).    [provider]  vitamin B-12 (CYANOCOBALAMIN) 1000 MCG tablet Take 1,000 mcg by mouth at bedtime.    [provider]  vitamin E 180 MG (400 UNITS) capsule Take 400 Units by mouth at bedtime.    [provider]    Allergies: Morphine, Codeine, Propoxyphene, and Shrimp [shellfish allergy]    Review of Systems  Constitutional:  Negative for fever.  Respiratory:  Negative for shortness of breath.   Cardiovascular:  Negative for chest pain.  Gastrointestinal:  Negative for abdominal pain, nausea and vomiting.  Musculoskeletal:        Back pain  All other systems reviewed and are negative.   Updated Vital Signs BP 124/60   Pulse (!) 59   Temp 98.3 F (36.8 C) (Oral)   Resp 16   Ht 1.778 m (5' 10)   Wt 74.8 kg   SpO2 93%   BMI 23.68 kg/m   Physical Exam Vitals and nursing note reviewed.  Constitutional:      Appearance: He is well-developed.     Comments: ABCs intact  HENT:     Head: Normocephalic and atraumatic.     Mouth/Throat:     Mouth: Mucous membranes are moist.  Eyes:  Pupils: Pupils are equal, round, and reactive to light.  Neck:     Comments: C-collar in place Cardiovascular:     Rate and Rhythm: Normal rate and regular rhythm.  Pulmonary:     Effort: Pulmonary effort is normal. No respiratory distress.  Abdominal:     Palpations: Abdomen is soft.     Tenderness: There is no abdominal tenderness.  Musculoskeletal:     Comments: 5 out of 5 strength bilateral lower extremities, neurovascularly intact  Skin:    General: Skin is warm and dry.  Neurological:     Mental Status: He is alert and oriented to person, place, and time.  Psychiatric:        Mood and Affect: Mood normal.     (all labs ordered are listed, but only abnormal results are displayed) Labs Reviewed  CBC WITH DIFFERENTIAL/PLATELET - Abnormal; Notable for the following  components:      Result Value   WBC 13.6 (*)    RBC 3.91 (*)    HCT 36.8 (*)    Neutro Abs 10.9 (*)    Monocytes Absolute 1.3 (*)    Abs Immature Granulocytes 0.09 (*)    All other components within normal limits  BASIC METABOLIC PANEL WITH GFR - Abnormal; Notable for the following components:   CO2 21 (*)    Glucose, Bld 155 (*)    Creatinine, Ser 1.31 (*)    GFR, Estimated 54 (*)    All other components within normal limits  URINALYSIS, ROUTINE W REFLEX MICROSCOPIC - Abnormal; Notable for the following components:   Ketones, ur 5 (*)    All other components within normal limits    EKG: None  Radiology: DG Chest 2 View Result Date: 04/13/2024 CLINICAL DATA:  Increasing O2 requirement. EXAM: CHEST - 2 VIEW COMPARISON:  07/20/2020 FINDINGS: Low lung volumes. The cardio pericardial silhouette is enlarged. There is pulmonary vascular congestion without overt pulmonary edema. No acute bony abnormality. IMPRESSION: Low volume film with vascular congestion. Electronically Signed   By: Camellia Candle M.D.   On: 04/13/2024 05:40   CT Cervical Spine Wo Contrast Result Date: 04/13/2024 EXAM: CT CERVICAL SPINE WITHOUT CONTRAST 04/13/2024 01:46:27 AM TECHNIQUE: CT of the cervical spine was performed without the administration of intravenous contrast. Multiplanar reformatted images are provided for review. Automated exposure control, iterative reconstruction, and/or weight based adjustment of the mA/kV was utilized to reduce the radiation dose to as low as reasonably achievable. COMPARISON: None available. CLINICAL HISTORY: Neck trauma (Age >= 65y) FINDINGS: CERVICAL SPINE: BONES AND ALIGNMENT: Straightening of the cervical spine. No subluxation. Facet alignment is normal. Anterior fusion hardware C3 through C6. No acute fracture or traumatic malalignment. DEGENERATIVE CHANGES: Moderate disc space narrowing at C2-C3 and advanced disc space narrowing C6-C7. Multilevel facet degenerative changes and  foraminal narrowing. Multilevel, at least mild, canal stenosis. SOFT TISSUES: No prevertebral soft tissue swelling. Apical emphysema. Carotid vascular calcification. IMPRESSION: 1. No acute osseous abnormality of the cervical spine. 2. Anterior fusion hardware C3 through C6. 3. Moderate disc space narrowing at C2-C3 and advanced disc space narrowing C6-C7. 4. Apical pulmonary emphysema; Electronically signed by: Luke Bun MD 04/13/2024 01:56 AM EST RP Workstation: [id number]   CT Head Wo Contrast Result Date: 04/13/2024 EXAM: CT HEAD WITHOUT CONTRAST 04/13/2024 01:46:27 AM TECHNIQUE: CT of the head was performed without the administration of intravenous contrast. Automated exposure control, iterative reconstruction, and/or weight based adjustment of the mA/kV was utilized to reduce the radiation dose to as  low as reasonably achievable. COMPARISON: MRI 11/05/2019, CT brain 11/05/2019. CLINICAL HISTORY: Head trauma, minor (Age >= 65y). FINDINGS: BRAIN AND VENTRICLES: No acute hemorrhage. No evidence of acute infarct. Moderate white matter hypodensity consistent with chronic small vessel ischemic change. Moderate atrophy. Prominent ventricles but felt proportionate to the degree of atrophy present. No extra-axial collection. No mass effect or midline shift. ORBITS: No acute abnormality. SINUSES: No acute abnormality. SOFT TISSUES AND SKULL: No acute soft tissue abnormality. No skull fracture. Carotid vascular calcification. IMPRESSION: 1. No acute intracranial abnormality. 2. 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 Electronically signed by: Luke Bun MD 04/13/2024 01:53 AM EST RP Workstation: [id number]   DG Lumbar Spine Complete Result Date: 04/13/2024 EXAM: 4 VIEW(S) XRAY OF THE LUMBAR SPINE 04/13/2024 01:39:05 AM COMPARISON: None available. CLINICAL HISTORY: Pain, fall. History of recent fall with low back pain, initial encounter. FINDINGS: LUMBAR SPINE: BONES: 5 lumbar  vertebral bodies are well visualized. Vertebral body height is well maintained, with the exception of the L2 vertebral body which demonstrates approximately 40 percent height loss anteriorly. No other compression fractures seen. Mildly anterolisthesis of L4 on L5. No aggressive appearing osseous lesion. No pars defects are seen. DISCS AND DEGENERATIVE CHANGES: Mild facet hypertrophic changes and osteophytic changes are seen. No severe degenerative changes. SOFT TISSUES: No acute abnormality. IMPRESSION: 1. Likely Acute L2 compression fracture with approximately 40% anterior height loss, compatible with recent trauma. Nonemergent MRI would be helpful for further clarification. 2. Mild anterolisthesis of L4 on L5 with mild facet hypertrophic and osteophytic changes. Electronically signed by: Oneil Devonshire MD 04/13/2024 01:51 AM EST RP Workstation: [id number]   DG Thoracic Spine 2 View Result Date: 04/13/2024 EXAM: 2 VIEW(S) XRAY OF THE THORACIC SPINE 04/13/2024 01:39:05 AM COMPARISON: None available. CLINICAL HISTORY: pain, fall FINDINGS: BONES: Pedicles are within normal limits. Vertebral body height is well maintained. No acute fracture. Alignment is normal. DISCS AND DEGENERATIVE CHANGES: Multilevel osteophytic changes are seen. No severe degenerative changes. SOFT TISSUES: No paraspinal mass is seen. The visualized lungs are clear. IMPRESSION: 1. Degenerative change without acute abnormality Electronically signed by: Oneil Devonshire MD 04/13/2024 01:49 AM EST RP Workstation: [id number]     Procedures   Medications Ordered in the ED  oxyCODONE -acetaminophen  (PERCOCET/ROXICET) 5-325 MG per tablet 1 tablet (1 tablet Oral Given 04/13/24 0513)  oxyCODONE -acetaminophen  (PERCOCET/ROXICET) 5-325 MG per tablet 1 tablet (1 tablet Oral Given 04/13/24 0118)    Clinical Course as of 04/13/24 0636  Tue Apr 13, 2024  0229 Daughter is at the bedside.  Advised of T2 compression fracture.  Daughter reports that he does  not even walk with his rollator at home.  She states that he holds onto the wall and is not very independent.  He relies heavily on her mother who is just here for fall as well.  She is concerned about his ability to go home with a new fracture.  He has been very resistant to any help at home or placement.  Will place TLSO brace and have him evaluated by PT in the morning with transition of care to help facilitate home health needs. [CH]  [id number] Chest x-ray and urinalysis without any evidence of infection.  Does have a slightly elevated white count without obvious source of infection.  Will have TOC and PT evaluate. [CH]    Clinical Course User Index [CH] Colton Tassin, Charmaine FALCON, MD  Medical Decision Making Amount and/or Complexity of Data Reviewed Labs: ordered. Radiology: ordered.  Risk Prescription drug management.   This patient presents to the ED for concern of fall, this involves an extensive number of treatment options, and is a complaint that carries with it a high risk of complications and morbidity.  I considered the following differential and admission for this acute, potentially life threatening condition.  The differential diagnosis includes head injury, back or neck injury, long bone injury  MDM:    This is a 83 year old male who presents after a fall.  Reports mechanical fall.  At baseline does not get around very well.  Daughter reports that he is supposed to use a rollator but really has difficulty managing getting around even with that.  He has ataxia at baseline.  Reporting some back pain after the fall.  Denies hitting his head or loss of consciousness.  X-rays obtained as well as CT head and cervical spine.  CT head and neck are reassuring.  Lumbar x-rays concerning for an L2 compression fracture.  Likely the culprit for the patient's pain.  He is neurologically intact.  TLSO ordered.  Daughter is concerned regarding his baseline ambulation issues and  new fracture.  He does not have home health and has been resistant to this previously.  Will have PT evaluate in the morning and transitions of care weigh in to aid in home health needs.  (Labs, imaging, consults)  Labs: I Ordered, and personally interpreted labs.  The pertinent results include: CBC, BMP  Imaging Studies ordered: I ordered imaging studies including CT, plain films of back I independently visualized and interpreted imaging. I agree with the radiologist interpretation  Additional history obtained from daughter.  External records from outside source obtained and reviewed including prior evaluations  Cardiac Monitoring: The patient was not maintained on a cardiac monitor.  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   Memory loss 03/16/2019   Mild cognitive impairment 12/12/2017   Pain due to onychomycosis of toenails of both feet 04/28/2019   Pain in left ankle and joints of left foot 08/06/2018   Slurred speech 03/16/2019   Superficial peroneal nerve neuropathy, 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   TIA (transient ischemic attack) 11/05/2019   Worsened handwriting 03/16/2019     Medicines Meds ordered this encounter  Medications   oxyCODONE -acetaminophen  (PERCOCET/ROXICET)  5-325 MG per tablet 1 tablet    Refill:  0   oxyCODONE -acetaminophen  (PERCOCET/ROXICET) 5-325 MG per tablet 1 tablet    Refill:  0    I have reviewed the patients home medicines and have made adjustments as needed  Problem List / ED Course: Problem List Items Addressed This Visit   None            Final diagnoses:  None  ED Discharge Orders     None          Glennie Bose, Charmaine FALCON, MD 04/13/24 [phone]

## 2024-04-13 NOTE — ED Notes (Addendum)
 Physical Therapy and patients daughter at bedside

## 2024-04-13 NOTE — Discharge Instructions (Addendum)
 Use TLSO brace at all times when mobile and standing.  Follow-up with neurosurgery in 1-2 weeks.  Follow with Primary MD Tisovec, [name], MD in 7 days   Get CBC, CMP, Magnesium , 2 view Chest X ray -  checked next visit with your primary MD or SNF MD   Activity: As tolerated with Full fall precautions use walker/cane & assistance as needed  Disposition SNF  Diet: Heart Healthy   Special Instructions: If you have smoked or chewed Tobacco  in the last 2 yrs please stop smoking, stop any regular Alcohol   and or any Recreational drug use.  On your next visit with your primary care physician please Get Medicines reviewed and adjusted.  Please request your Prim.MD to go over all Hospital Tests and Procedure/Radiological results at the follow up, please get all Hospital records sent to your Prim MD by signing hospital release before you go home.  If you experience worsening of your admission symptoms, develop shortness of breath, life threatening emergency, suicidal or homicidal thoughts you must seek medical attention immediately by calling 911 or calling your MD immediately  if symptoms less severe.  You Must read complete instructions/literature along with all the possible adverse reactions/side effects for all the Medicines you take and that have been prescribed to you. Take any new Medicines after you have completely understood and accpet all the possible adverse reactions/side effects.   Do not drive when taking Pain medications.  Do not take more than prescribed Pain, Sleep and Anxiety Medications  Wear Seat belts while driving.

## 2024-04-13 NOTE — ED Provider Notes (Signed)
 Care of patient assumed from Dr. Charlyn.  This patient presented last night from a fall.  He is found to have T2 compression fracture.  Given his new injury and poor mobility at baseline, plan was for skilled nursing facility placement.  However, he was found to have new onset hypoxia while in the ED.  CTA is pending. Physical Exam  BP 126/76   Pulse 69   Temp 98 F (36.7 C) (Oral)   Resp 18   Ht 5' 10 (1.778 m)   Wt 74.8 kg   SpO2 94%   BMI 23.68 kg/m   Physical Exam Vitals and nursing note reviewed.  Constitutional:      General: He is not in acute distress.    Appearance: Normal appearance. He is well-developed. He is not ill-appearing, toxic-appearing or diaphoretic.  HENT:     Head: Normocephalic and atraumatic.     Right Ear: External ear normal.     Left Ear: External ear normal.     Nose: Nose normal.     Mouth/Throat:     Mouth: Mucous membranes are moist.  Eyes:     Extraocular Movements: Extraocular movements intact.     Conjunctiva/sclera: Conjunctivae normal.  Cardiovascular:     Rate and Rhythm: Normal rate and regular rhythm.  Pulmonary:     Effort: Pulmonary effort is normal. Tachypnea present. No respiratory distress.     Breath sounds: Decreased breath sounds present.  Abdominal:     General: There is no distension.     Palpations: Abdomen is soft.     Tenderness: There is no abdominal tenderness.  Musculoskeletal:        General: No swelling.     Cervical back: Normal range of motion and neck supple.  Skin:    General: Skin is warm and dry.     Coloration: Skin is not jaundiced or pale.  Neurological:     General: No focal deficit present.     Mental Status: He is alert and oriented to person, place, and time.  Psychiatric:        Mood and Affect: Mood normal.        Behavior: Behavior normal.     Procedures  Procedures  ED Course / MDM   Clinical Course as of 04/13/24 1552  Tue Apr 13, 2024  0229 Daughter is at the bedside.  Advised of  T2 compression fracture.  Daughter reports that he does not even walk with his rollator at home.  She states that he holds onto the wall and is not very independent.  He relies heavily on her mother who is just here for fall as well.  She is concerned about his ability to go home with a new fracture.  He has been very resistant to any help at home or placement.  Will place TLSO brace and have him evaluated by PT in the morning with transition of care to help facilitate home health needs. [CH]  [id number] Chest x-ray and urinalysis without any evidence of infection.  Does have a slightly elevated white count without obvious source of infection.  Will have TOC and PT evaluate. [CH]    Clinical Course User Index [CH] Horton, Charmaine FALCON, MD   Medical Decision Making Amount and/or Complexity of Data Reviewed Labs: ordered. Radiology: ordered.  Risk OTC drugs. Prescription drug management.   On assessment patient remains on 2 L of supplemental oxygen.  He is mildly tachypneic.  On lung auscultation, he does have mildly  diminished breath sounds.  Breathing treatment was ordered.  BNP is mildly elevated.  Will give dose of Lasix .  Given his ongoing hypoxic respiratory failure, patient to be admitted.       Melvenia Motto, MD 04/13/24 [phone]

## 2024-04-13 NOTE — Hospital Course (Signed)
 Derrick White is a 84 y.o. 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

## 2024-04-13 NOTE — Progress Notes (Signed)
 16 Fr. Coude with 10 cc syringe bulb placed under sterile procedure with clear yellow urine return. Pt tolerated well with lidocaine  gel, no adverse effect.  Durwood Garland RN

## 2024-04-13 NOTE — ED Notes (Signed)
 Pt transported to CT ?

## 2024-04-13 NOTE — ED Triage Notes (Signed)
 Pt BIB [facility] from home after mechanical fall onto carpet floor. Pt has history of ataxia and uses a rollator to ambulate. Pt states that he lost his balance. No LOC. Pt complains of middle lower back pain.

## 2024-04-14 ENCOUNTER — Inpatient Hospital Stay (HOSPITAL_COMMUNITY)

## 2024-04-14 DIAGNOSIS — M48061 Spinal stenosis, lumbar region without neurogenic claudication: Secondary | ICD-10-CM | POA: Diagnosis not present

## 2024-04-14 DIAGNOSIS — M51369 Other intervertebral disc degeneration, lumbar region without mention of lumbar back pain or lower extremity pain: Secondary | ICD-10-CM | POA: Diagnosis not present

## 2024-04-14 DIAGNOSIS — M47816 Spondylosis without myelopathy or radiculopathy, lumbar region: Secondary | ICD-10-CM | POA: Diagnosis not present

## 2024-04-14 DIAGNOSIS — I480 Paroxysmal atrial fibrillation: Secondary | ICD-10-CM | POA: Diagnosis not present

## 2024-04-14 DIAGNOSIS — M4856XA Collapsed vertebra, not elsewhere classified, lumbar region, initial encounter for fracture: Secondary | ICD-10-CM | POA: Diagnosis not present

## 2024-04-14 DIAGNOSIS — I10 Essential (primary) hypertension: Secondary | ICD-10-CM | POA: Diagnosis not present

## 2024-04-14 DIAGNOSIS — S32020A Wedge compression fracture of second lumbar vertebra, initial encounter for closed fracture: Secondary | ICD-10-CM

## 2024-04-14 DIAGNOSIS — J9601 Acute respiratory failure with hypoxia: Secondary | ICD-10-CM | POA: Diagnosis not present

## 2024-04-14 LAB — CBC
HCT: 40.4 % (ref 39.0–52.0)
Hemoglobin: 13.9 g/dL (ref 13.0–17.0)
MCH: 32.6 pg (ref 26.0–34.0)
MCHC: 34.4 g/dL (ref 30.0–36.0)
MCV: 94.8 fL (ref 80.0–100.0)
Platelets: 236 K/uL (ref 150–400)
RBC: 4.26 MIL/uL (ref 4.22–5.81)
RDW: 12.4 % (ref 11.5–15.5)
WBC: 11.6 K/uL — ABNORMAL HIGH (ref 4.0–10.5)
nRBC: 0 % (ref 0.0–0.2)

## 2024-04-14 LAB — COMPREHENSIVE METABOLIC PANEL WITH GFR
ALT: 37 U/L (ref 0–44)
AST: 32 U/L (ref 15–41)
Albumin: 3.5 g/dL (ref 3.5–5.0)
Alkaline Phosphatase: 28 U/L — ABNORMAL LOW (ref 38–126)
Anion gap: 10 (ref 5–15)
BUN: 18 mg/dL (ref 8–23)
CO2: 26 mmol/L (ref 22–32)
Calcium: 8.9 mg/dL (ref 8.9–10.3)
Chloride: 102 mmol/L (ref 98–111)
Creatinine, Ser: 1.36 mg/dL — ABNORMAL HIGH (ref 0.61–1.24)
GFR, Estimated: 52 mL/min — ABNORMAL LOW (ref >60)
Glucose, Bld: 123 mg/dL — ABNORMAL HIGH (ref 70–99)
Potassium: 4.3 mmol/L (ref 3.5–5.1)
Sodium: 138 mmol/L (ref 135–145)
Total Bilirubin: 1.9 mg/dL — ABNORMAL HIGH (ref 0.0–1.2)
Total Protein: 6.8 g/dL (ref 6.5–8.1)

## 2024-04-14 LAB — GLUCOSE, CAPILLARY: Glucose-Capillary: 111 mg/dL — ABNORMAL HIGH (ref 70–99)

## 2024-04-14 LAB — SEDIMENTATION RATE: Sed Rate: 16 mm/h (ref 0–16)

## 2024-04-14 LAB — MAGNESIUM: Magnesium: 2 mg/dL (ref 1.7–2.4)

## 2024-04-14 MED ORDER — BISACODYL 10 MG RE SUPP
10.0000 mg | Freq: Every day | RECTAL | Status: DC | PRN
  Administered 2024-04-15: 10 mg via RECTAL
  Filled 2024-04-14: qty 1

## 2024-04-14 MED ORDER — LIDOCAINE 5 % EX PTCH
2.0000 | MEDICATED_PATCH | CUTANEOUS | Status: DC
  Administered 2024-04-14 – 2024-04-21 (×6): 2 via TRANSDERMAL
  Filled 2024-04-14 (×7): qty 2

## 2024-04-14 MED ORDER — CYCLOBENZAPRINE HCL 5 MG PO TABS
5.0000 mg | ORAL_TABLET | Freq: Three times a day (TID) | ORAL | Status: DC | PRN
  Administered 2024-04-15 – 2024-04-21 (×3): 5 mg via ORAL
  Filled 2024-04-14 (×3): qty 1

## 2024-04-14 MED ORDER — SENNOSIDES-DOCUSATE SODIUM 8.6-50 MG PO TABS
2.0000 | ORAL_TABLET | Freq: Every day | ORAL | Status: DC
  Administered 2024-04-14 – 2024-04-20 (×7): 2 via ORAL
  Filled 2024-04-14 (×7): qty 2

## 2024-04-14 MED ORDER — ACETAMINOPHEN 500 MG PO TABS: 1000.0000 mg | ORAL_TABLET | Freq: Three times a day (TID) | ORAL | Status: DC

## 2024-04-14 MED ORDER — MELATONIN 3 MG PO TABS
3.0000 mg | ORAL_TABLET | Freq: Once | ORAL | Status: AC
Start: 2024-04-15 — End: 2024-04-14
  Administered 2024-04-14: 3 mg via ORAL
  Filled 2024-04-14: qty 1

## 2024-04-14 MED ORDER — TAMSULOSIN HCL 0.4 MG PO CAPS
0.4000 mg | ORAL_CAPSULE | Freq: Every day | ORAL | Status: DC
  Administered 2024-04-14 – 2024-04-21 (×8): 0.4 mg via ORAL
  Filled 2024-04-14 (×8): qty 1

## 2024-04-14 MED ORDER — KETOROLAC TROMETHAMINE 15 MG/ML IJ SOLN
15.0000 mg | Freq: Three times a day (TID) | INTRAMUSCULAR | Status: DC | PRN
  Administered 2024-04-15 – 2024-04-16 (×2): 15 mg via INTRAVENOUS
  Filled 2024-04-14 (×2): qty 1

## 2024-04-14 MED ORDER — SODIUM CHLORIDE 0.9 % IV SOLN: INTRAVENOUS | Status: AC

## 2024-04-14 MED ORDER — POLYETHYLENE GLYCOL 3350 17 G PO PACK
17.0000 g | PACK | Freq: Two times a day (BID) | ORAL | Status: DC
  Administered 2024-04-14 – 2024-04-21 (×13): 17 g via ORAL
  Filled 2024-04-14 (×15): qty 1

## 2024-04-14 MED ORDER — ENSURE PLUS HIGH PROTEIN PO LIQD
237.0000 mL | Freq: Two times a day (BID) | ORAL | Status: DC
  Administered 2024-04-15 – 2024-04-21 (×12): 237 mL via ORAL

## 2024-04-14 MED ORDER — ACETAMINOPHEN 500 MG PO TABS
1000.0000 mg | ORAL_TABLET | Freq: Three times a day (TID) | ORAL | Status: DC
  Administered 2024-04-14 – 2024-04-21 (×19): 1000 mg via ORAL
  Filled 2024-04-14 (×21): qty 2

## 2024-04-14 NOTE — TOC CAGE-AID Note (Signed)
 Transition of Care [facility]) - CAGE-AID Screening   Patient Details  Name: Derrick White MRN: [id number] Date of Birth: 01-05-1941  Transition of Care [facility]) CM/SW Contact:    Bernardino Dean, LCSWA Phone Number: 04/14/2024, 12:10 PM   Clinical Narrative: SW met with patient and spouse at bedside to complete CAGE-AID screen. Patient asleep, ongoing medical concerns, spouse assisted with screening. Patient drinks 1x/day, right before dinner.    CAGE-AID Screening:    Have You Ever Felt You Ought to Cut Down on Your Drinking or Drug Use?: No Have People Annoyed You By Critizing Your Drinking Or Drug Use?: Yes Have You Felt Bad Or Guilty About Your Drinking Or Drug Use?: No Have You Ever Had a Drink or Used Drugs First Thing In The Morning to Steady Your Nerves or to Get Rid of a Hangover?: No CAGE-AID Score: 1  Substance Abuse Education Offered: Yes

## 2024-04-14 NOTE — Evaluation (Signed)
 Occupational Therapy Evaluation Patient Details Name: Derrick White MRN: [id number] DOB: [birth year] Today's Date: 04/14/2024   History of Present Illness   Pt is 83 yo presenting to [facility] on 11/25 due to a fall. Found to have L2 compression fracture. 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     Clinical Impressions At baseline, pt requires Set up to Min assist for ADLs, receives assistance for IADLs, and ambulates with Mod I handing on to wall or furniture or with intermittent use of Rollator or cane. Pt and wife report a history of falls, including fall leading to this admission. Pt currently demonstrates decreased activity tolerance, impaired cardiopulmonary status, decreased knowledge of precautions, decreased knowledge of DME/AE, decreased balance, mildly decreased cognition, and decreased safety and independence with functional tasks. Pt currently demonstrating ability to complete ADLs largely with Set up to Max assist, bed mobility with Min to Mod assist, and functional transfers with use of a RW with Min assist, all while adhering to back precautions. Pt currently requiring cues for adhering to back precautions, compensatory strategies, and occasional cues for sequencing. Pt participated well in session. Pt will benefit from acute OT services to address deficits and increase safety and independence with functional tasks. Post acute discharge, pt will benefit from intensive inpatient skilled rehab services < 3 hours per day to maximize rehab potential, decrease risk of falls, and decrease risk of rehospitalization.   Pt BP soft but stable with pt reporting brief mild dizziness upon first sitting that quickly dissapated. HR in the 70s to low 80s. Pt on 3L continous O2  through nasal cannula upone arrival with O2 sat steady at 89 to 90% at rest. With activity, pt O2 sat dropping and staying steady at 86 to 87%. Pt bumped up to 4L with no change in O2 sat at rest with PLB. O2 bumped to 5L with O2 sat rising to 90% and staying between 90 and 93% on 5L throughout the remainder of session. Pt left on 5L at end of session with RN notified.     If plan is discharge home, recommend the following:   A little help with walking and/or transfers;A lot of help with bathing/dressing/bathroom;Assistance with cooking/housework;Direct supervision/assist for medications management;Direct supervision/assist for financial management;Assist for transportation;Help with stairs or ramp for entrance     Functional Status Assessment   Patient has had a recent decline in their functional status and demonstrates the ability to make significant improvements in function in a reasonable and predictable amount of time.     Equipment Recommendations   BSC/3in1     Recommendations for Other Services         Precautions/Restrictions   Precautions Precautions: Fall;Back Precaution Booklet Issued: No Recall of Precautions/Restrictions: Impaired Precaution/Restrictions Comments: verbally reviewed back precautions with pt this session; pt verbalized understanding, but will benefit from reinforcement of education Required Braces or Orthoses: Spinal Brace Spinal Brace: Thoracolumbosacral orthotic Restrictions Other Position/Activity Restrictions: back precautions     Mobility Bed Mobility Overal bed mobility: Needs Assistance Bed Mobility: Sidelying to Sit, Rolling Rolling: Min assist Sidelying to sit: Mod assist       General bed mobility comments: Pt guided through log roll technique with step by step directions. Mod assist to elevate trunk into sitting while adhering to back precautions.    Transfers Overall  transfer level: Needs assistance Equipment used:  Rolling walker (2 wheels) Transfers: Sit to/from Stand, Bed to chair/wheelchair/BSC Sit to Stand: Min assist, From elevated surface     Step pivot transfers: Min assist, From elevated surface     General transfer comment: Min A for rising from sitting with multi modal cues for safe hand placement to push up from stretcher. Cues to adhere to back precauitons and cues for sequencing and technique with use of RW      Balance Overall balance assessment: Needs assistance Sitting-balance support: Single extremity supported, Bilateral upper extremity supported, Feet supported Sitting balance-Leahy Scale: Fair Sitting balance - Comments: Able to maintain balance static sitting at EOB with Supervision. Close Sueprvision to CGA for dynamic sitting at EOB   Standing balance support: Bilateral upper extremity supported, During functional activity, Reliant on assistive device for balance Standing balance-Leahy Scale: Poor Standing balance comment: CGA to Min assist in static and dynamic standing and step-pivot transfer                           ADL either performed or assessed with clinical judgement   ADL Overall ADL's : Needs assistance/impaired Eating/Feeding: Set up;Sitting   Grooming: Set up;Sitting   Upper Body Bathing: Minimal assistance;Cueing for compensatory techniques;Sitting (adhering to back precautions)   Lower Body Bathing: Maximal assistance;Sit to/from stand;Cueing for back precautions;Cueing for compensatory techniques;Adhering to back precautions   Upper Body Dressing : Minimal assistance;Moderate assistance;Cueing for compensatory techniques (cues needed for back precautions; adhering to back precautions)   Lower Body Dressing: Maximal assistance;Cueing for compensatory techniques;Cueing for back precautions;Adhering to back precautions;Sitting/lateral leans;Sit to/from stand   Toilet Transfer: Minimal assistance;Cueing for safety;Cueing for sequencing;Rolling  walker (2 wheels);BSC/3in1 (step-pivot transfer; adhering to back precautions; cues for hand placement/technique) Toilet Transfer Details (indicate cue type and reason): simulated bed to recliner Toileting- Clothing Manipulation and Hygiene: Maximal assistance;Cueing for compensatory techniques;Sit to/from stand;Adhering to back precautions;Cueing for back precautions         General ADL Comments: Max assist needed to donn/doff TLSO brace. Pt with decreased activity tolerance and decreased O2 sat with activity with pt requiring rest breaks and increased O2 flow rate during activity with RN notified.     Vision Patient Visual Report: No change from baseline Additional Comments: [facility] for tasks assessed; not formally screened or evaluated     Perception         Praxis         Pertinent Vitals/Pain Pain Assessment Pain Assessment: Faces Faces Pain Scale: Hurts little more Pain Location: lower back with movement Pain Descriptors / Indicators: Aching, Discomfort, Grimacing Pain Intervention(s): Limited activity within patient's tolerance, Monitored during session, Repositioned, Other (comment) (TLSO brace applied)     Extremity/Trunk Assessment Upper Extremity Assessment Upper Extremity Assessment: Right hand dominant;Generalized weakness;RUE deficits/detail;LUE deficits/detail RUE Deficits / Details: generalized weakness; decreased coordination at baseline RUE Coordination: decreased fine motor;decreased gross motor (at baseline) LUE Deficits / Details: generalized weakness; decreased coordination at baseline LUE Coordination: decreased fine motor;decreased gross motor (at baseline)   Lower Extremity Assessment Lower Extremity Assessment: Defer to PT evaluation   Cervical / Trunk Assessment Cervical / Trunk Assessment: Other exceptions Cervical / Trunk Exceptions: L2 compression fracture due to fall leading to this admission PMH: C6 radiculopathy, lumbosacra radiculopathy  at S1   Communication Communication Communication: Impaired Factors Affecting Communication: Difficulty expressing self   Cognition Arousal: Alert Behavior During Therapy: [facility] for tasks assessed/performed  Cognition: Cognition impaired     Awareness: Intellectual awareness intact, Online awareness intact (intermittent online awarness) Memory impairment (select all impairments): Working civil service fast streamer, Short-term memory Attention impairment (select first level of impairment): Alternating attention Executive functioning impairment (select all impairments): Organization, Problem solving, Sequencing OT - Cognition Comments: Pt AAOx4 and pleasant throughout session with cognitive deficits noted above. Per wife report, pt is near baseline cognition.                 Following commands: Intact       Cueing  General Comments   Cueing Techniques: Verbal cues  BP soft but stable with pt reporting brief mild dizziness upon first sitting that quickly dissapated. HR in the 70s to low 80s. Pt on 3L continous O2 through nasal cannula upone arrival with O2 sat steady at 89 to 90% at rest. With activity, pt O2 sat dropping and staying steady at 86 to 87%. Pt bumped up to 4L with no change in O2 sat at rest with PLB. O2 bumped to 5L with O2 sat rising to 90% and staying between 90 and 93% on 5L throughout the remainder of session. Pt left on 5L at end of session with RN notified. Pt's wife present and supportive throughout session. RN and MD present for a portion of session.   Exercises     Shoulder Instructions      Home Living Family/patient expects to be discharged to:: Private residence Living Arrangements: Spouse/significant other Available Help at Discharge: Family;Available PRN/intermittently Type of Home: House Home Access: Stairs to enter [employer] of Steps: 3 Entrance Stairs-Rails: Right;Left;Can reach both Home Layout: Two level;Able to live on main level with  bedroom/bathroom Alternate Level Stairs-Number of Steps: flight Alternate Level Stairs-Rails: Can reach both Bathroom Shower/Tub: Walk-in shower;Tub/shower unit   Bathroom Toilet: Handicapped height Bathroom Accessibility: Yes   Home Equipment: Rollator (4 wheels);Cane - single point;Grab bars - tub/shower;Shower seat   Additional Comments: Pt's wife report she also currently has back precautions and cannot provide much physical assistance to pt at this time.      Prior Functioning/Environment Prior Level of Function : Independent/Modified Independent;History of Falls (last six months)             Mobility Comments: Pt has a rollator but does not use it all the time. States he uses cane some and also holds on to wall, furniture, or his wife. Pt reports, I almost fall every day. Per wife with a few falls over the past 6 months and confirms pt has near falls most days. ADLs Comments: Spouse largely provides Set up to Supervision with ADLs with intermittent Min assist to doff pants. Spouse assists with IADLs.    OT Problem List: Decreased strength;Decreased activity tolerance;Impaired balance (sitting and/or standing);Decreased coordination;Decreased cognition;Decreased safety awareness;Decreased knowledge of use of DME or AE;Decreased knowledge of precautions;Cardiopulmonary status limiting activity   OT Treatment/Interventions: Self-care/ADL training;Energy conservation;DME and/or AE instruction;Therapeutic activities;Cognitive remediation/compensation;Patient/family education;Balance training      OT Goals(Current goals can be found in the care plan section)   Acute Rehab OT Goals Patient Stated Goal: to return home OT Goal Formulation: With patient/family Time For Goal Achievement: 04/28/24 Potential to Achieve Goals: Good ADL Goals Pt Will Perform Grooming: with supervision;standing (adhering to back precautions) Pt Will Perform Upper Body Bathing: with contact guard  assist;sitting (adhering to back precautions; with adaptive equipment as needed) Pt Will Perform Lower Body Bathing: with min assist;sitting/lateral leans;sit to/from stand (adhering to back precautions; with adaptive  equipment as needed) Pt Will Perform Upper Body Dressing: with contact guard assist;sitting (adhering to back precautions) Pt Will Perform Lower Body Dressing: with min assist;sitting/lateral leans;sit to/from stand (adhering to back precautions; with adaptive equipment as needed) Pt Will Transfer to Toilet: with supervision;ambulating;bedside commode (with least restrictive AD; adhering to back precautions) Pt Will Perform Toileting - Clothing Manipulation and hygiene: with min assist;sitting/lateral leans;sit to/from stand (adhering to back precautions; with adaptive equipment as needed)   OT Frequency:  Min 2X/week    Co-evaluation              AM-PAC OT 6 Clicks Daily Activity     Outcome Measure Help from another person eating meals?: A Little Help from another person taking care of personal grooming?: A Little Help from another person toileting, which includes using toliet, bedpan, or urinal?: A Lot Help from another person bathing (including washing, rinsing, drying)?: A Lot Help from another person to put on and taking off regular upper body clothing?: A Lot Help from another person to put on and taking off regular lower body clothing?: A Lot 6 Click Score: 14   End of Session Equipment Utilized During Treatment: Rolling walker (2 wheels);Oxygen;Back brace Nurse Communication: Mobility status;Other (comment) (Vital signs; O2 flow increased to 5L during session due to pt with persistent desat at 3L and 4L with activity.)  Activity Tolerance: Patient tolerated treatment well Patient left: in bed;with call bell/phone within reach;with family/visitor present;Other (comment) (with chair alarm placed in chair)  OT Visit Diagnosis: Unsteadiness on feet (R26.81);Other  abnormalities of gait and mobility (R26.89);Muscle weakness (generalized) (M62.81);History of falling (Z91.81);Repeated falls (R29.6);Other symptoms and signs involving cognitive function                Time: 8780-8743 OT Time Calculation (min): 37 min Charges:  OT General Charges $OT Visit: 1 Visit OT Evaluation $OT Eval Moderate Complexity: 1 Mod OT Treatments $Self Care/Home Management : 8-22 mins  Margarie Rockey HERO., OTR/L, MA Acute Rehab [phone]   Margarie FORBES Horns 04/14/2024, 1:38 PM

## 2024-04-14 NOTE — Plan of Care (Signed)

## 2024-04-14 NOTE — Evaluation (Signed)
 Clinical/Bedside Swallow Evaluation Patient Details  Name: Derrick White MRN: [id number] Date of Birth: Oct 25, 1940  Today's Date: 04/14/2024 Time: SLP Start Time (ACUTE ONLY): 9073 SLP Stop Time (ACUTE ONLY): 0940 SLP Time Calculation (min) (ACUTE ONLY): 14 min  Past Medical History:  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 05/04/2019   Memory loss 03/16/2019   Mild cognitive impairment 12/12/2017   Pain due to onychomycosis of toenails of both feet 04/28/2019   Pain in left ankle and joints of left foot 08/06/2018   Slurred speech 03/16/2019   Superficial peroneal nerve neuropathy, left 06/04/2016   TIA (transient ischemic attack) 11/05/2019   Worsened handwriting 03/16/2019   Past Surgical History:  Past Surgical History:  Procedure Laterality Date   APPENDECTOMY     1940s   BACK SURGERY     1981, 1998   CARDIOVERSION N/A 09/13/2020   Procedure: CARDIOVERSION;  Surgeon: [name], MD;  Location: [facility] ENDOSCOPY;  Service: Cardiovascular;  Laterality: N/A;   FRACTURE SURGERY     Left ankle, sx x4   INGUINAL HERNIA REPAIR Right 03/05/2019   Procedure: OPEN RIGHT INGUINAL HERNIA REPAIR WITH MESH;  Surgeon: [name], MD;  Location: [facility] OR;  Service: General;  Laterality: Right;   KNEE SURGERY Left 1985   LEFT ATRIAL APPENDAGE OCCLUSION N/A 07/27/2020   Procedure: LEFT ATRIAL APPENDAGE OCCLUSION;  Surgeon: [name], MD;  Location: [facility] INVASIVE CV LAB;  Service: Cardiovascular;  Laterality: N/A;   SHOULDER SURGERY Left    TEE WITHOUT CARDIOVERSION N/A 07/27/2020   Procedure: TRANSESOPHAGEAL ECHOCARDIOGRAM (TEE);  Surgeon: [name], MD;   Location: [facility] INVASIVE CV LAB;  Service: Cardiovascular;  Laterality: N/A;   TEE WITHOUT CARDIOVERSION N/A 09/13/2020   Procedure: TRANSESOPHAGEAL ECHOCARDIOGRAM (TEE);  Surgeon: [name], MD;  Location: [facility] ENDOSCOPY;  Service: Cardiovascular;  Laterality: N/A;   TONSILLECTOMY     1945   HPI:  83 yo male presenting to ED 11/25 after a fall at home. Had multiple episodes of emesis and was noted have hypoxemia. Admitted with presumed aspiration pneumonia and acute L2 compression fx. CT negative for frank infiltrates. Most recent MBS 06/17/22 showed transient penetration with thin liquids and no aspiration, overall similar to previous MBS in 2021 recommended by OP SLP for cerebellar ataxia. Esophagram 2021 revealed small hiatal hernia, inducible GER, and mild esophageal dysmotility. 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 Sit upright during and 30 minutes after meals given history of GER and esophageal dysmotility. Pt and his spouse do not wish to repeat an MBS at this time but can f/u on an OP basis if needed. Will sign off acutely.   Pt is alert and observed to feed himself regular solids, achieving complete oral clearance. Noted no signs clinically concerning for aspiration with consecutive sips of thin liquids. While pt and his spouse state that choking episodes still occur periodically at home, pt has no previous admissions for  pneumonia and CT Chest at time of admission was also negative. Discussed previous recommendations and swallow strategies as well as the option to repeat an MBS, which they decline.   SLP Visit Diagnosis: Dysphagia, unspecified (R13.10)    Aspiration Risk  Mild aspiration risk    Diet Recommendation Regular;Thin liquid    Liquid  Administration via: Cup;Straw Medication Administration: Whole meds with liquid Supervision: Patient able to self feed;Intermittent supervision to cue for compensatory strategies Compensations: Minimize environmental distractions;Slow rate;Small sips/bites;Multiple dry swallows after each bite/sip Postural Changes: Seated upright at 90 degrees;Remain upright for at least 30 minutes after po intake    Other  Recommendations Oral Care Recommendations: Oral care BID     Assistance Recommended at Discharge    Functional Status Assessment Patient has not had a recent decline in their functional status  Frequency and Duration            Prognosis Prognosis for improved oropharyngeal function: Good Barriers to Reach Goals: Time post onset      Swallow Study   General HPI: 83 yo male presenting to ED 11/25 after a fall at home. Had multiple episodes of emesis and was noted have hypoxemia. Admitted with presumed aspiration pneumonia and acute L2 compression fx. CT negative for frank infiltrates. Most recent MBS 06/17/22 showed transient penetration with thin liquids and no aspiration, overall similar to previous MBS in 2021 recommended by OP SLP for cerebellar ataxia. Esophagram 2021 revealed small hiatal hernia, inducible GER, and mild esophageal dysmotility. 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, spinocerebellar ataxia affecting speech and gait, HTN, HLD, C6 radiculopathy Type of Study: Bedside Swallow Evaluation Previous Swallow Assessment: see HPI Diet Prior to this Study: NPO Temperature Spikes Noted: No Respiratory Status: Nasal cannula History of Recent Intubation: No Behavior/Cognition: Alert;Cooperative;Pleasant mood Oral Cavity Assessment: Within Functional Limits Oral Care Completed by SLP: No Oral Cavity - Dentition: Adequate natural dentition Vision: Functional for self-feeding Self-Feeding Abilities: Able to feed self Patient Positioning: Upright in bed Baseline Vocal  Quality: Normal Volitional Cough: Strong Volitional Swallow: Able to elicit    Oral/Motor/Sensory Function Overall Oral Motor/Sensory Function: Within functional limits   Ice Chips Ice chips: Not tested   Thin Liquid Thin Liquid: Within functional limits Presentation: Straw;Self Fed    Nectar Thick Nectar Thick Liquid: Not tested   Honey Thick Honey Thick Liquid: Not tested   Puree Puree: Not tested   Solid     Solid: Within functional limits Presentation: Self Fed      Damien Blumenthal, M.A., CCC-SLP Speech Language Pathology, Acute Rehabilitation Services  Secure Chat preferred [phone]  04/14/2024,10:57 AM

## 2024-04-14 NOTE — TOC Progression Note (Signed)
 Transition of Care [facility]) - Progression Note    Patient Details  Name: Derrick White MRN: [id number] Date of Birth: 1941/01/12  [facility] CM/SW Contact  Inocente GORMAN Kindle, LCSW Phone Number: 04/14/2024, 9:31 AM  Clinical Narrative:    Patient admitted from the ED. CSW left voicemail for [employer] requesting they hold off on authorization until patient is considered medically stable.    Expected Discharge Plan: Skilled Nursing Facility Barriers to Discharge: Continued Medical Work up, English As A Second Language Teacher               Expected Discharge Plan and Services In-house Referral: Clinical Social Work   Post Acute Care Choice: Skilled Nursing Facility                                         Social Drivers of Health (SDOH) Interventions SDOH Screenings   Food Insecurity: Patient Unable To Answer (04/13/2024)  Housing: Patient Unable To Answer (04/13/2024)  Transportation Needs: Patient Unable To Answer (04/13/2024)  Utilities: Patient Unable To Answer (04/13/2024)  Social Connections: Patient Unable To Answer (04/13/2024)  Tobacco Use: Medium Risk (03/24/2024)    Readmission Risk Interventions     No data to display

## 2024-04-14 NOTE — Progress Notes (Addendum)
 PROGRESS NOTE        PATIENT DETAILS Name: Derrick White Age: 83 y.o. Sex: male Date of Birth: [birth year] Admit Date: 04/13/2024 Admitting Physician [name], MD ERE:[email or web address], [name], MD  Brief Summary: Patient is a 83 y.o.  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  Significant events: 11/25>> admit to [facility].  Significant studies: 11/25>> x-ray lumbar spine: L2 compression fracture. 11/25>> x-ray thoracic spine: No acute abnormality. 11/25>> CT head: No acute intracranial abnormality. 11/25>> CT C-spine: No fracture, anterior fusion C3-C6. 11/25>> CXR: Vascular congestion. 11/25>> CTA chest: No PE, mild emphysema. 11/25>> x-ray abdomen: No acute findings, moderate colonic stool burden. 11/26>> CT lumbar spine: Moderate acute appearing compression fracture involving L2.  Significant microbiology data: None  Procedures: None  Consults: None  Subjective: Appears comfortable-lying flat-hard of hearing but claims breathing is okay.  Was on 8-10 L of oxygen this morning-I titrated him down to 4 L.  Objective: Vitals: Blood pressure (!) 112/57, pulse 73, temperature 98.2 F (36.8 C), temperature source Oral, resp. rate 20, height 5' 10 (1.778 m), weight 74.8 kg, SpO2 94%.   Exam: Gen Exam:Alert awake-not in any distress HEENT:atraumatic, normocephalic Chest: B/L clear to auscultation anteriorly CVS:S1S2 regular Abdomen:soft non tender, non distended Extremities:no edema Neurology: Non focal-but appears to have  generalized weakness.  Gait not tested. Skin: no rash  Pertinent Labs/Radiology:    Latest Ref Rng & Units 04/14/2024    2:44 AM 04/13/2024    3:18 AM 10/31/2020   11:19 AM  CBC  WBC 4.0 - 10.5 K/uL 11.6  13.6  7.0   Hemoglobin 13.0 - 17.0 g/dL 86.0  86.9  85.1   Hematocrit 39.0 - 52.0 % 40.4  36.8  45.7   Platelets 150 - 400 K/uL 236  243  336     Lab Results  Component Value Date   NA 138 04/14/2024   K 4.3 04/14/2024   CL 102 04/14/2024   CO2 26 04/14/2024      Assessment/Plan: Acute hypoxic respiratory failure Suspicion for aspiration pneumonia (vomited repeatedly in the ED)-although no frank infiltrates seen on imaging studies No evidence of edema/volume overload Seems to be improving-down to 4 L of oxygen this morning Continue Unasyn  Await SLP eval  Presumed aspiration pneumonia See above  Acute L2 compression fracture Secondary to mechanical fall TSLO brace Seems comfortable this morning-Will schedule Tylenol , as needed Flexeril , Lidoderm  patch.  Toradol  as needed for pain Avoid narcotics as much as possible given issues with vomiting in the ED yesterday. PT/OT eval Easily bending both knees and lifting both legs off the bed.  Sensation is grossly intact. Outpatient neurosurgical evaluation.  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  AKI Mild Probably secondary to recent nausea/vomiting Will gently hydrate with IV fluids. Repeat electrolytes tomorrow.  Paroxysmal atrial fibrillation-s/p watchman's device 2022. Maintaining sinus rhythm Received epic text message from Dr. Vina Gull (patient's primary cardiologist)-recommendations are to stop amiodarone  (given respiratory failure) and  to check ESR. Not on anticoagulation-as has watchman's device.  History of CVA 2021 Supportive care No anticoagulation as has watchman's  device.  HTN Amlodipine   HLD Lipitor  Constipation Stool burden seen on CT imaging Placing on MiraLAX /senna As needed Dulcolax Monitor.  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  Seems comfortable this morning-await SLP/PT/OT eval.  Palliative care Spoke with daughter [name] over the phone on 11/26-significant decline in function over the past several months-barely ambulatory-issues with dysphagia-poor oral intake.  Per [name]-patient never wanted any heroic measures.  Agreeable for DNR. Will see how he does with SLP/PT/OT-oxygen wean-if he worsens-May need palliative care evaluation at some point.  But currently appears stable/comfortable.   Code status:   Code Status: Full Code   DVT Prophylaxis: enoxaparin  (LOVENOX ) injection 40 mg Start: 04/13/24 2215   Family Communication: Daughter -[name] updated 11/26   Disposition Plan: Status is: Inpatient Remains inpatient appropriate because: Severity of illness   Planned Discharge Destination:Home health versus SNF   Diet: Diet Order             Diet NPO time specified Except for: Sips with Meds  Diet effective now                     Antimicrobial agents: Anti-infectives (From admission, onward)    Start     Dose/Rate Route Frequency Ordered Stop   04/13/24 2230  ampicillin -sulbactam (UNASYN ) 1.5 g in sodium chloride  0.9 % 100 mL IVPB        1.5 g 200 mL/hr over 30 Minutes Intravenous Every 6 hours 04/13/24 2129          MEDICATIONS: Scheduled Meds:  amiodarone   200 mg Oral Daily   amLODipine   2.5 mg Oral BID   aspirin  EC  81 mg Oral Daily   atorvastatin   40 mg Oral Daily   Chlorhexidine  Gluconate Cloth  6 each Topical Daily   enoxaparin  (LOVENOX ) injection  40 mg Subcutaneous Q24H    sodium chloride  flush  3 mL Intravenous Q12H   Continuous Infusions:  ampicillin -sulbactam (UNASYN ) IV 1.5 g (04/14/24 0501)   PRN Meds:.acetaminophen  **OR** acetaminophen , bisacodyl , prochlorperazine , senna-docusate   I have personally reviewed following labs and imaging studies  LABORATORY DATA: CBC: Recent Labs  Lab 04/13/24 0318 04/14/24 0244  WBC 13.6* 11.6*  NEUTROABS 10.9*  --   HGB 13.0 13.9  HCT 36.8* 40.4  MCV 94.1 94.8  PLT 243 236    Basic Metabolic Panel: Recent Labs  Lab 04/13/24 0318 04/13/24 1600 04/14/24 0244  NA 136  --  138  K 3.8  --  4.3  CL 104  --  102  CO2 21*  --  26  GLUCOSE 155*  --  123*  BUN 20  --  18  CREATININE 1.31*  --  1.36*  CALCIUM  9.0  --  8.9  MG  --  1.6* 2.0    GFR: Estimated Creatinine Clearance: 42.5 mL/min (A) (by C-G formula based on SCr of 1.36 mg/dL (H)).  Liver Function Tests: Recent Labs  Lab 04/14/24 0244  AST 32  ALT 37  ALKPHOS 28*  BILITOT 1.9*  PROT 6.8  ALBUMIN 3.5   No results for input(s): LIPASE, AMYLASE in the last 168 hours. No results for input(s): AMMONIA in the last 168 hours.  Coagulation Profile: No results for input(s):  INR, PROTIME in the last 168 hours.  Cardiac Enzymes: No results for input(s): CKTOTAL, CKMB, CKMBINDEX, TROPONINI in the last 168 hours.  BNP (last 3 results) No results for input(s): PROBNP in the last 8760 hours.  Lipid Profile: No results for input(s): CHOL, HDL, LDLCALC, TRIG, CHOLHDL, LDLDIRECT in the last 72 hours.  Thyroid  Function Tests: No results for input(s): TSH, T4TOTAL, FREET4, T3FREE, THYROIDAB in the last 72 hours.  Anemia Panel: No results for input(s): VITAMINB12, FOLATE, FERRITIN, TIBC, IRON, RETICCTPCT in the last 72 hours.  Urine analysis:    Component Value Date/Time   COLORURINE YELLOW 04/13/2024 0554   APPEARANCEUR CLEAR 04/13/2024 0554   LABSPEC 1.018 04/13/2024 0554   PHURINE  5.0 04/13/2024 0554   GLUCOSEU NEGATIVE 04/13/2024 0554   HGBUR NEGATIVE 04/13/2024 0554   BILIRUBINUR NEGATIVE 04/13/2024 0554   KETONESUR 5 (A) 04/13/2024 0554   PROTEINUR NEGATIVE 04/13/2024 0554   UROBILINOGEN 0.2 04/29/2009 1117   NITRITE NEGATIVE 04/13/2024 0554   LEUKOCYTESUR NEGATIVE 04/13/2024 0554    Sepsis Labs: Lactic Acid, Venous No results found for: LATICACIDVEN  MICROBIOLOGY: No results found for this or any previous visit (from the past 240 hours).  RADIOLOGY STUDIES/RESULTS: CT Lumbar Spine Wo Contrast Result Date: 04/14/2024 EXAM: CT OF THE LUMBAR SPINE WITHOUT CONTRAST 04/14/2024 12:12:57 AM TECHNIQUE: CT of the lumbar spine was performed without the administration of intravenous contrast. Multiplanar reformatted images are provided for review. Automated exposure control, iterative reconstruction, and/or weight based adjustment of the mA/kV was utilized to reduce the radiation dose to as low as reasonably achievable. COMPARISON: Plain films today. CLINICAL HISTORY: Compression fracture, lumbar. FINDINGS: BONES AND ALIGNMENT: Moderate acute appearing compression fracture involving L2. Normal alignment. DEGENERATIVE CHANGES: Disc space narrowing and spurring at L5-S1. Moderate bilateral degenerative facet disease, most pronounced in the lower lumbar spine. SOFT TISSUES: No acute abnormality. IMPRESSION: 1. Moderate acute appearing compression fracture involving L2. 2. Degenerative changes as above. Electronically signed by: Franky Crease MD 04/14/2024 12:18 AM EST RP Workstation: [id number]   DG Abd 1 View Result Date: 04/13/2024 EXAM: 1 VIEW XRAY OF THE ABDOMEN 04/13/2024 10:07:00 PM COMPARISON: Comparison with 09/29/2019. CLINICAL HISTORY: Nausea and vomiting. FINDINGS: BOWEL: Gas and stool throughout the colon with moderate stool burden. No small or large bowel distention. SOFT TISSUES: No radiopaque stones. Aortoiliac calcifications. Residual contrast material in the  bladder. BONES: No acute osseous abnormality. Degenerative changes in the spine and hips. LUNG BASES: Visualized lung bases appear clear. IMPRESSION: 1. No acute findings. 2. Moderate colonic stool burden without bowel obstruction. Electronically signed by: Elsie Gravely MD 04/13/2024 10:21 PM EST RP Workstation: [id number]   CT Angio Chest PE W and/or Wo Contrast Result Date: 04/13/2024 EXAM: CTA CHEST AORTA 04/13/2024 04:49:42 PM TECHNIQUE: CTA of the chest was performed without and with the administration of 75 mL of intravenous contrast (iohexol  (OMNIPAQUE ) 350 MG/ML injection 75 mL IOHEXOL  350 MG/ML SOLN). Multiplanar reformatted images are provided for review. MIP images are provided for review. Automated exposure control, iterative reconstruction, and/or weight based adjustment of the mA/kV was utilized to reduce the radiation dose to as low as reasonably achievable. COMPARISON: Chest x-ray 04/13/2024. CLINICAL HISTORY: Pulmonary embolism (PE) suspected, high prob. FINDINGS: AORTA: Mild aortic atherosclerosis. No aneurysm. No thoracic aortic dissection. MEDIASTINUM: Left atrial appendage occluding device. Coronary vascular calcifications. Cardiomegaly. No significant pericardial effusion. No mediastinal lymphadenopathy. Mild fluid distention of the mid to distal esophagus. Small hiatal hernia is present. LYMPH NODES: No mediastinal, hilar or axillary  lymphadenopathy. LUNGS AND PLEURA: Mild emphysema. Mild subpleural scarring within the right lung. No focal consolidation or pulmonary edema. No pleural effusion or pneumothorax. UPPER ABDOMEN: Hyperdense-appearing liver parenchyma. Limited images of the upper abdomen are otherwise unremarkable. SOFT TISSUES AND BONES: No acute bone or soft tissue abnormality. IMPRESSION: 1. No evidence of pulmonary embolism. 2. Cardiomegaly. 3. Mild emphysema without acute airspace disease. 4. Hyperdense appearing liver parenchyma. This can be seen with iron overload or in  association with certain medications (?amiodarone ) 5. Small hiatal hernia with mild diffuse fluid-filled mid to distal esophagus . Correlate for any history of reflux or esophagitis. Electronically signed by: Luke Bun MD 04/13/2024 05:16 PM EST RP Workstation: [id number]   DG Chest 2 View Result Date: 04/13/2024 CLINICAL DATA:  Increasing O2 requirement. EXAM: CHEST - 2 VIEW COMPARISON:  07/20/2020 FINDINGS: Low lung volumes. The cardio pericardial silhouette is enlarged. There is pulmonary vascular congestion without overt pulmonary edema. No acute bony abnormality. IMPRESSION: Low volume film with vascular congestion. Electronically Signed   By: Camellia Candle M.D.   On: 04/13/2024 05:40   CT Cervical Spine Wo Contrast Result Date: 04/13/2024 EXAM: CT CERVICAL SPINE WITHOUT CONTRAST 04/13/2024 01:46:27 AM TECHNIQUE: CT of the cervical spine was performed without the administration of intravenous contrast. Multiplanar reformatted images are provided for review. Automated exposure control, iterative reconstruction, and/or weight based adjustment of the mA/kV was utilized to reduce the radiation dose to as low as reasonably achievable. COMPARISON: None available. CLINICAL HISTORY: Neck trauma (Age >= 65y) FINDINGS: CERVICAL SPINE: BONES AND ALIGNMENT: Straightening of the cervical spine. No subluxation. Facet alignment is normal. Anterior fusion hardware C3 through C6. No acute fracture or traumatic malalignment. DEGENERATIVE CHANGES: Moderate disc space narrowing at C2-C3 and advanced disc space narrowing C6-C7. Multilevel facet degenerative changes and foraminal narrowing. Multilevel, at least mild, canal stenosis. SOFT TISSUES: No prevertebral soft tissue swelling. Apical emphysema. Carotid vascular calcification. IMPRESSION: 1. No acute osseous abnormality of the cervical spine. 2. Anterior fusion hardware C3 through C6. 3. Moderate disc space narrowing at C2-C3 and advanced disc space narrowing C6-C7.  4. Apical pulmonary emphysema; Electronically signed by: Luke Bun MD 04/13/2024 01:56 AM EST RP Workstation: [id number]   CT Head Wo Contrast Result Date: 04/13/2024 EXAM: CT HEAD WITHOUT CONTRAST 04/13/2024 01:46:27 AM TECHNIQUE: CT of the head was performed without the administration of intravenous contrast. Automated exposure control, iterative reconstruction, and/or weight based adjustment of the mA/kV was utilized to reduce the radiation dose to as low as reasonably achievable. COMPARISON: MRI 11/05/2019, CT brain 11/05/2019. CLINICAL HISTORY: Head trauma, minor (Age >= 65y). FINDINGS: BRAIN AND VENTRICLES: No acute hemorrhage. No evidence of acute infarct. Moderate white matter hypodensity consistent with chronic small vessel ischemic change. Moderate atrophy. Prominent ventricles but felt proportionate to the degree of atrophy present. No extra-axial collection. No mass effect or midline shift. ORBITS: No acute abnormality. SINUSES: No acute abnormality. SOFT TISSUES AND SKULL: No acute soft tissue abnormality. No skull fracture. Carotid vascular calcification. IMPRESSION: 1. No acute intracranial abnormality. 2. 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 Electronically signed by: Luke Bun MD 04/13/2024 01:53 AM EST RP Workstation: [id number]   DG Lumbar Spine Complete Result Date: 04/13/2024 EXAM: 4 VIEW(S) XRAY OF THE LUMBAR SPINE 04/13/2024 01:39:05 AM COMPARISON: None available. CLINICAL HISTORY: Pain, fall. History of recent fall with low back pain, initial encounter. FINDINGS: LUMBAR SPINE: BONES: 5 lumbar vertebral bodies are well visualized. Vertebral body height is well maintained, with the  exception of the L2 vertebral body which demonstrates approximately 40 percent height loss anteriorly. No other compression fractures seen. Mildly anterolisthesis of L4 on L5. No aggressive appearing osseous lesion. No pars defects are seen. DISCS AND DEGENERATIVE  CHANGES: Mild facet hypertrophic changes and osteophytic changes are seen. No severe degenerative changes. SOFT TISSUES: No acute abnormality. IMPRESSION: 1. Likely Acute L2 compression fracture with approximately 40% anterior height loss, compatible with recent trauma. Nonemergent MRI would be helpful for further clarification. 2. Mild anterolisthesis of L4 on L5 with mild facet hypertrophic and osteophytic changes. Electronically signed by: Oneil Devonshire MD 04/13/2024 01:51 AM EST RP Workstation: [id number]   DG Thoracic Spine 2 View Result Date: 04/13/2024 EXAM: 2 VIEW(S) XRAY OF THE THORACIC SPINE 04/13/2024 01:39:05 AM COMPARISON: None available. CLINICAL HISTORY: pain, fall FINDINGS: BONES: Pedicles are within normal limits. Vertebral body height is well maintained. No acute fracture. Alignment is normal. DISCS AND DEGENERATIVE CHANGES: Multilevel osteophytic changes are seen. No severe degenerative changes. SOFT TISSUES: No paraspinal mass is seen. The visualized lungs are clear. IMPRESSION: 1. Degenerative change without acute abnormality Electronically signed by: Oneil Devonshire MD 04/13/2024 01:49 AM EST RP Workstation: [id number]     LOS: 1 day   Donalda Applebaum, MD  [facility]    To contact the attending provider between 7A-7P or the covering provider during after hours 7P-7A, please log into the web site [email or web address] and access using universal [place] password for that web site. If you do not have the password, please call the hospital operator.  04/14/2024, 8:44 AM

## 2024-04-15 DIAGNOSIS — J9601 Acute respiratory failure with hypoxia: Secondary | ICD-10-CM | POA: Diagnosis not present

## 2024-04-15 DIAGNOSIS — I480 Paroxysmal atrial fibrillation: Secondary | ICD-10-CM | POA: Diagnosis not present

## 2024-04-15 DIAGNOSIS — S32020A Wedge compression fracture of second lumbar vertebra, initial encounter for closed fracture: Secondary | ICD-10-CM | POA: Diagnosis not present

## 2024-04-15 DIAGNOSIS — I10 Essential (primary) hypertension: Secondary | ICD-10-CM | POA: Diagnosis not present

## 2024-04-15 LAB — LACTIC ACID, PLASMA: Lactic Acid, Venous: 0.8 mmol/L (ref 0.5–1.9)

## 2024-04-15 LAB — BASIC METABOLIC PANEL WITH GFR
Anion gap: 9 (ref 5–15)
BUN: 27 mg/dL — ABNORMAL HIGH (ref 8–23)
CO2: 24 mmol/L (ref 22–32)
Calcium: 8.3 mg/dL — ABNORMAL LOW (ref 8.9–10.3)
Chloride: 103 mmol/L (ref 98–111)
Creatinine, Ser: 1.15 mg/dL (ref 0.61–1.24)
GFR, Estimated: >60 mL/min (ref >60)
Glucose, Bld: 117 mg/dL — ABNORMAL HIGH (ref 70–99)
Potassium: 3.7 mmol/L (ref 3.5–5.1)
Sodium: 136 mmol/L (ref 135–145)

## 2024-04-15 MED ORDER — MIDODRINE HCL 5 MG PO TABS
5.0000 mg | ORAL_TABLET | Freq: Three times a day (TID) | ORAL | Status: DC
  Administered 2024-04-15 (×3): 5 mg via ORAL
  Filled 2024-04-15 (×3): qty 1

## 2024-04-15 MED ORDER — MELATONIN 3 MG PO TABS
3.0000 mg | ORAL_TABLET | Freq: Once | ORAL | Status: AC
  Administered 2024-04-15: 3 mg via ORAL
  Filled 2024-04-15: qty 1

## 2024-04-15 MED ORDER — SODIUM CHLORIDE 0.9 % IV BOLUS
500.0000 mL | Freq: Once | INTRAVENOUS | Status: AC
  Administered 2024-04-15: 500 mL via INTRAVENOUS

## 2024-04-15 MED ORDER — IPRATROPIUM-ALBUTEROL 0.5-2.5 (3) MG/3ML IN SOLN: 3.0000 mL | Freq: Four times a day (QID) | RESPIRATORY_TRACT | Status: DC | PRN

## 2024-04-15 NOTE — Plan of Care (Signed)

## 2024-04-15 NOTE — Progress Notes (Signed)
 PROGRESS NOTE        PATIENT DETAILS Name: Derrick White Age: 83 y.o. Sex: male Date of Birth: Dec 20, 1940 Admit Date: 04/13/2024 Admitting Physician [name], MD ERE:[email or web address], [name], MD  Brief Summary: Patient is a 83 y.o.  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  Significant events: 11/25>> admit to [facility].  Significant studies: 11/25>> x-ray lumbar spine: L2 compression fracture. 11/25>> x-ray thoracic spine: No acute abnormality. 11/25>> CT head: No acute intracranial abnormality. 11/25>> CT C-spine: No fracture, anterior fusion C3-C6. 11/25>> CXR: Vascular congestion. 11/25>> CTA chest: No PE, mild emphysema. 11/25>> x-ray abdomen: No acute findings, moderate colonic stool burden. 11/26>> CT lumbar spine: Moderate acute appearing compression fracture involving L2.  Significant microbiology data: None  Procedures: None  Consults: None  Subjective: No major issues overnight-lying comfortably in bed.  Some issues with hypotension overnight-on 4-5 L of oxygen overnight but titrated down to 2-3 L this morning.  Objective: Vitals: Blood pressure (!) 84/46, pulse 64, temperature 97.7 F (36.5 C), temperature source Oral, resp. rate 14, height 5' 10 (1.778 m), weight 74.8 kg, SpO2 92%.   Exam: Gen Exam:Alert awake-not in any distress HEENT:atraumatic, normocephalic Chest: B/L clear to auscultation anteriorly CVS:S1S2 regular Abdomen:soft non tender, non distended Extremities:no edema Neurology: Generalized weakness.   Moving all 4 extremities. Skin: no rash  Pertinent Labs/Radiology:    Latest Ref Rng & Units 04/14/2024    2:44 AM 04/13/2024    3:18 AM 10/31/2020   11:19 AM  CBC  WBC 4.0 - 10.5 K/uL 11.6  13.6  7.0   Hemoglobin 13.0 - 17.0 g/dL 86.0  86.9  85.1   Hematocrit 39.0 - 52.0 % 40.4  36.8  45.7   Platelets 150 - 400 K/uL 236  243  336     Lab Results  Component Value Date   NA 136 04/15/2024   K 3.7 04/15/2024   CL 103 04/15/2024   CO2 24 04/15/2024      Assessment/Plan: Acute hypoxic respiratory failure Suspicion for aspiration pneumonia (vomited repeatedly in the ED)-although no frank infiltrates seen on imaging studies No evidence of edema/volume overload Slowly improving-Down to 2-3 L of oxygen this morning Continue Unasyn  Appreciate SLP evaluation.  Presumed aspiration pneumonia See above  Acute L2 compression fracture Secondary to mechanical fall Pain seems to be currently controlled-continue scheduled Tylenol /Lidoderm  patch As needed Flexeril /Toradol  Avoid narcotics as much as possible given issues with sedation/vomiting resulting in aspiration Use TSLO brace as much as possible Outpatient follow-up with neurosurgery.  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  AKI Mild Probably secondary to recent nausea/vomiting Resolved with supportive care.  Paroxysmal atrial fibrillation-s/p watchman's device 2022. Maintaining sinus rhythm Received epic text message from Dr. Vina Gull (patient's primary cardiologist)-on 11/26-recommendations are to stop amiodarone  (given respiratory failure) and to check ESR. Not on anticoagulation-as has watchman's device.  History of CVA 2021 Supportive care No anticoagulation as has watchman's device.  HTN BP soft-stop amlodipine -and  start low-dose midodrine .  HLD Lipitor  Constipation Stool burden seen on CT  imaging Continue MiraLAX /senna Continue as needed Dulcolax If he still does not have a BM-May need enema.    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  Appreciate SLP/PT/OT input-tolerating regular diet-SNF planned on discharge.  Palliative care Spoke with daughter [name] over the phone on 11/26-significant decline in function over the past several months-barely ambulatory-issues with dysphagia-poor oral intake.  Per [name]-patient never wanted any heroic measures.  Agreeable for DNR.  Subsequently confirmed with patient at bedside as well. Will need outpatient palliative care follow-up-currently stable-and does not appear to be deteriorating.  Code status:   Code Status: Limited: Do not attempt resuscitation (DNR) -DNR-LIMITED -Do Not Intubate/DNI    DVT Prophylaxis: enoxaparin  (LOVENOX ) injection 40 mg Start: 04/13/24 2215   Family Communication: Daughter -[name]-[phone] updated 11/26   Disposition Plan: Status is: Inpatient Remains inpatient appropriate because: Severity of illness   Planned Discharge Destination:Home health versus SNF   Diet: Diet Order             Diet regular Room service appropriate? No; Fluid consistency: Thin  Diet effective now                     Antimicrobial agents: Anti-infectives (From admission, onward)    Start     Dose/Rate Route Frequency Ordered Stop   04/13/24 2230  ampicillin -sulbactam (UNASYN ) 1.5 g in sodium chloride  0.9 % 100 mL IVPB        1.5 g 200 mL/hr over 30 Minutes Intravenous Every 6 hours 04/13/24 2129          MEDICATIONS: Scheduled Meds:  acetaminophen   1,000 mg Oral Q8H   aspirin  EC  81 mg Oral Daily   atorvastatin   40 mg Oral Daily   Chlorhexidine  Gluconate Cloth  6 each Topical Daily   enoxaparin   (LOVENOX ) injection  40 mg Subcutaneous Q24H   feeding supplement  237 mL Oral BID BM   lidocaine   2 patch Transdermal Q24H   midodrine   5 mg Oral TID WC   polyethylene glycol  17 g Oral BID   senna-docusate  2 tablet Oral QHS   sodium chloride  flush  3 mL Intravenous Q12H   tamsulosin   0.4 mg Oral Daily   Continuous Infusions:  ampicillin -sulbactam (UNASYN ) IV 200 mL/hr at 04/15/24 0636   PRN Meds:.bisacodyl , cyclobenzaprine , ketorolac , prochlorperazine    I have personally reviewed following labs and imaging studies  LABORATORY DATA: CBC: Recent Labs  Lab 04/13/24 0318 04/14/24 0244  WBC 13.6* 11.6*  NEUTROABS 10.9*  --   HGB 13.0 13.9  HCT 36.8* 40.4  MCV 94.1 94.8  PLT 243 236    Basic Metabolic Panel: Recent Labs  Lab 04/13/24 0318 04/13/24 1600 04/14/24 0244 04/15/24 0326  NA 136  --  138 136  K 3.8  --  4.3 3.7  CL 104  --  102 103  CO2 21*  --  26 24  GLUCOSE 155*  --  123* 117*  BUN 20  --  18 27*  CREATININE 1.31*  --  1.36* 1.15  CALCIUM  9.0  --  8.9 8.3*  MG  --  1.6* 2.0  --     GFR: Estimated Creatinine Clearance: 50.3 mL/min (by C-G formula based on SCr of 1.15 mg/dL).  Liver Function Tests: Recent  Labs  Lab 04/14/24 0244  AST 32  ALT 37  ALKPHOS 28*  BILITOT 1.9*  PROT 6.8  ALBUMIN 3.5   No results for input(s): LIPASE, AMYLASE in the last 168 hours. No results for input(s): AMMONIA in the last 168 hours.  Coagulation Profile: No results for input(s): INR, PROTIME in the last 168 hours.  Cardiac Enzymes: No results for input(s): CKTOTAL, CKMB, CKMBINDEX, TROPONINI in the last 168 hours.  BNP (last 3 results) No results for input(s): PROBNP in the last 8760 hours.  Lipid Profile: No results for input(s): CHOL, HDL, LDLCALC, TRIG, CHOLHDL, LDLDIRECT in the last 72 hours.  Thyroid  Function Tests: No results for input(s): TSH, T4TOTAL, FREET4, T3FREE, THYROIDAB in the last 72  hours.  Anemia Panel: No results for input(s): VITAMINB12, FOLATE, FERRITIN, TIBC, IRON, RETICCTPCT in the last 72 hours.  Urine analysis:    Component Value Date/Time   COLORURINE YELLOW 04/13/2024 0554   APPEARANCEUR CLEAR 04/13/2024 0554   LABSPEC 1.018 04/13/2024 0554   PHURINE 5.0 04/13/2024 0554   GLUCOSEU NEGATIVE 04/13/2024 0554   HGBUR NEGATIVE 04/13/2024 0554   BILIRUBINUR NEGATIVE 04/13/2024 0554   KETONESUR 5 (A) 04/13/2024 0554   PROTEINUR NEGATIVE 04/13/2024 0554   UROBILINOGEN 0.2 04/29/2009 1117   NITRITE NEGATIVE 04/13/2024 0554   LEUKOCYTESUR NEGATIVE 04/13/2024 0554    Sepsis Labs: Lactic Acid, Venous    Component Value Date/Time   LATICACIDVEN 0.8 04/15/2024 0542    MICROBIOLOGY: No results found for this or any previous visit (from the past 240 hours).  RADIOLOGY STUDIES/RESULTS: DG CHEST PORT 1 VIEW Result Date: 04/14/2024 CLINICAL DATA:  Hypoxia. EXAM: PORTABLE CHEST 1 VIEW COMPARISON:  04/13/2024 FINDINGS: Normal-sized heart. Aortic arch calcifications. Interval mild left basilar atelectasis. Otherwise, clear lungs with normal vascularity. Thoracic spine degenerative changes and cervical spine fixation hardware. IMPRESSION: Interval mild left basilar atelectasis. Electronically Signed   By: Elspeth Bathe M.D.   On: 04/14/2024 10:41   CT Lumbar Spine Wo Contrast Result Date: 04/14/2024 EXAM: CT OF THE LUMBAR SPINE WITHOUT CONTRAST 04/14/2024 12:12:57 AM TECHNIQUE: CT of the lumbar spine was performed without the administration of intravenous contrast. Multiplanar reformatted images are provided for review. Automated exposure control, iterative reconstruction, and/or weight based adjustment of the mA/kV was utilized to reduce the radiation dose to as low as reasonably achievable. COMPARISON: Plain films today. CLINICAL HISTORY: Compression fracture, lumbar. FINDINGS: BONES AND ALIGNMENT: Moderate acute appearing compression fracture involving  L2. Normal alignment. DEGENERATIVE CHANGES: Disc space narrowing and spurring at L5-S1. Moderate bilateral degenerative facet disease, most pronounced in the lower lumbar spine. SOFT TISSUES: No acute abnormality. IMPRESSION: 1. Moderate acute appearing compression fracture involving L2. 2. Degenerative changes as above. Electronically signed by: Franky Crease MD 04/14/2024 12:18 AM EST RP Workstation: [id number]   DG Abd 1 View Result Date: 04/13/2024 EXAM: 1 VIEW XRAY OF THE ABDOMEN 04/13/2024 10:07:00 PM COMPARISON: Comparison with 09/29/2019. CLINICAL HISTORY: Nausea and vomiting. FINDINGS: BOWEL: Gas and stool throughout the colon with moderate stool burden. No small or large bowel distention. SOFT TISSUES: No radiopaque stones. Aortoiliac calcifications. Residual contrast material in the bladder. BONES: No acute osseous abnormality. Degenerative changes in the spine and hips. LUNG BASES: Visualized lung bases appear clear. IMPRESSION: 1. No acute findings. 2. Moderate colonic stool burden without bowel obstruction. Electronically signed by: Elsie Gravely MD 04/13/2024 10:21 PM EST RP Workstation: [id number]   CT Angio Chest PE W and/or Wo Contrast Result Date: 04/13/2024 EXAM: CTA CHEST AORTA 04/13/2024  04:49:42 PM TECHNIQUE: CTA of the chest was performed without and with the administration of 75 mL of intravenous contrast (iohexol  (OMNIPAQUE ) 350 MG/ML injection 75 mL IOHEXOL  350 MG/ML SOLN). Multiplanar reformatted images are provided for review. MIP images are provided for review. Automated exposure control, iterative reconstruction, and/or weight based adjustment of the mA/kV was utilized to reduce the radiation dose to as low as reasonably achievable. COMPARISON: Chest x-ray 04/13/2024. CLINICAL HISTORY: Pulmonary embolism (PE) suspected, high prob. FINDINGS: AORTA: Mild aortic atherosclerosis. No aneurysm. No thoracic aortic dissection. MEDIASTINUM: Left atrial appendage occluding device.  Coronary vascular calcifications. Cardiomegaly. No significant pericardial effusion. No mediastinal lymphadenopathy. Mild fluid distention of the mid to distal esophagus. Small hiatal hernia is present. LYMPH NODES: No mediastinal, hilar or axillary lymphadenopathy. LUNGS AND PLEURA: Mild emphysema. Mild subpleural scarring within the right lung. No focal consolidation or pulmonary edema. No pleural effusion or pneumothorax. UPPER ABDOMEN: Hyperdense-appearing liver parenchyma. Limited images of the upper abdomen are otherwise unremarkable. SOFT TISSUES AND BONES: No acute bone or soft tissue abnormality. IMPRESSION: 1. No evidence of pulmonary embolism. 2. Cardiomegaly. 3. Mild emphysema without acute airspace disease. 4. Hyperdense appearing liver parenchyma. This can be seen with iron overload or in association with certain medications (?amiodarone ) 5. Small hiatal hernia with mild diffuse fluid-filled mid to distal esophagus . Correlate for any history of reflux or esophagitis. Electronically signed by: Luke Bun MD 04/13/2024 05:16 PM EST RP Workstation: [id number]     LOS: 2 days   Donalda Applebaum, MD  [facility]    To contact the attending provider between 7A-7P or the covering provider during after hours 7P-7A, please log into the web site [email or web address] and access using universal [place] password for that web site. If you do not have the password, please call the hospital operator.  04/15/2024, 10:20 AM

## 2024-04-16 ENCOUNTER — Inpatient Hospital Stay (HOSPITAL_COMMUNITY)

## 2024-04-16 DIAGNOSIS — R9389 Abnormal findings on diagnostic imaging of other specified body structures: Secondary | ICD-10-CM | POA: Diagnosis not present

## 2024-04-16 DIAGNOSIS — I480 Paroxysmal atrial fibrillation: Secondary | ICD-10-CM | POA: Diagnosis not present

## 2024-04-16 DIAGNOSIS — I700 Atherosclerosis of aorta: Secondary | ICD-10-CM | POA: Diagnosis not present

## 2024-04-16 DIAGNOSIS — J9811 Atelectasis: Secondary | ICD-10-CM | POA: Diagnosis not present

## 2024-04-16 DIAGNOSIS — S32020A Wedge compression fracture of second lumbar vertebra, initial encounter for closed fracture: Secondary | ICD-10-CM | POA: Diagnosis not present

## 2024-04-16 DIAGNOSIS — R0602 Shortness of breath: Secondary | ICD-10-CM | POA: Diagnosis not present

## 2024-04-16 DIAGNOSIS — I10 Essential (primary) hypertension: Secondary | ICD-10-CM | POA: Diagnosis not present

## 2024-04-16 DIAGNOSIS — J9601 Acute respiratory failure with hypoxia: Secondary | ICD-10-CM | POA: Diagnosis not present

## 2024-04-16 LAB — CBC
HCT: 35.6 % — ABNORMAL LOW (ref 39.0–52.0)
Hemoglobin: 12.1 g/dL — ABNORMAL LOW (ref 13.0–17.0)
MCH: 32.7 pg (ref 26.0–34.0)
MCHC: 34 g/dL (ref 30.0–36.0)
MCV: 96.2 fL (ref 80.0–100.0)
Platelets: 223 K/uL (ref 150–400)
RBC: 3.7 MIL/uL — ABNORMAL LOW (ref 4.22–5.81)
RDW: 12.3 % (ref 11.5–15.5)
WBC: 8.4 K/uL (ref 4.0–10.5)
nRBC: 0 % (ref 0.0–0.2)

## 2024-04-16 LAB — BASIC METABOLIC PANEL WITH GFR
Anion gap: 12 (ref 5–15)
BUN: 30 mg/dL — ABNORMAL HIGH (ref 8–23)
CO2: 24 mmol/L (ref 22–32)
Calcium: 8.5 mg/dL — ABNORMAL LOW (ref 8.9–10.3)
Chloride: 103 mmol/L (ref 98–111)
Creatinine, Ser: 1.19 mg/dL (ref 0.61–1.24)
GFR, Estimated: >60 mL/min (ref >60)
Glucose, Bld: 111 mg/dL — ABNORMAL HIGH (ref 70–99)
Potassium: 3.9 mmol/L (ref 3.5–5.1)
Sodium: 139 mmol/L (ref 135–145)

## 2024-04-16 LAB — BRAIN NATRIURETIC PEPTIDE: B Natriuretic Peptide: 73.9 pg/mL (ref 0.0–100.0)

## 2024-04-16 MED ORDER — FUROSEMIDE 10 MG/ML IJ SOLN
20.0000 mg | Freq: Once | INTRAMUSCULAR | Status: AC
  Administered 2024-04-16: 20 mg via INTRAVENOUS
  Filled 2024-04-16: qty 2

## 2024-04-16 MED ORDER — MIDODRINE HCL 5 MG PO TABS
7.5000 mg | ORAL_TABLET | Freq: Three times a day (TID) | ORAL | Status: DC
  Administered 2024-04-16 – 2024-04-21 (×15): 7.5 mg via ORAL
  Filled 2024-04-16 (×15): qty 2

## 2024-04-16 MED ORDER — BISACODYL 10 MG RE SUPP
10.0000 mg | Freq: Once | RECTAL | Status: AC
  Administered 2024-04-16: 10 mg via RECTAL
  Filled 2024-04-16: qty 1

## 2024-04-16 MED ORDER — BISACODYL 10 MG RE SUPP: 10.0000 mg | Freq: Every day | RECTAL | Status: DC | PRN

## 2024-04-16 MED ORDER — MIDODRINE HCL 5 MG PO TABS: 10.0000 mg | ORAL_TABLET | Freq: Three times a day (TID) | ORAL | Status: DC

## 2024-04-16 MED ORDER — HYDROXYZINE HCL 10 MG PO TABS
10.0000 mg | ORAL_TABLET | Freq: Three times a day (TID) | ORAL | Status: DC | PRN
  Administered 2024-04-16 – 2024-04-21 (×6): 10 mg via ORAL
  Filled 2024-04-16 (×6): qty 1

## 2024-04-16 NOTE — TOC Progression Note (Addendum)
 Transition of Care [facility]) - Progression Note    Patient Details  Name: Derrick White MRN: [id number] Date of Birth: 10-12-1940  Transition of Care [facility]) CM/SW Contact  Inocente GORMAN Kindle, LCSW Phone Number: 04/16/2024, 8:51 AM  Clinical Narrative:    8:51 AM-CSW continuing to follow for medical stability to begin insurance process for SNF. Provided update to [facility].   Expected Discharge Plan: Skilled Nursing Facility Barriers to Discharge: Continued Medical Work up, English As A Second Language Teacher               Expected Discharge Plan and Services In-house Referral: Clinical Social Work   Post Acute Care Choice: Skilled Nursing Facility                                         Social Drivers of Health (SDOH) Interventions SDOH Screenings   Food Insecurity: Patient Unable To Answer (04/13/2024)  Housing: Patient Unable To Answer (04/13/2024)  Transportation Needs: Patient Unable To Answer (04/13/2024)  Utilities: Patient Unable To Answer (04/13/2024)  Social Connections: Patient Unable To Answer (04/13/2024)  Tobacco Use: Medium Risk (03/24/2024)    Readmission Risk Interventions     No data to display

## 2024-04-16 NOTE — Care Management Important Message (Signed)
 Important Message  Patient Details  Name: Derrick White MRN: [id number] Date of Birth: 1940-10-17   Important Message Given:  Yes - Medicare IM     [name] 04/16/2024, 2:05 PM

## 2024-04-16 NOTE — Plan of Care (Signed)
  Problem: Clinical Measurements: Goal: Diagnostic test results will improve Outcome: Progressing Goal: Respiratory complications will improve Outcome: Progressing   Problem: Nutrition: Goal: Adequate nutrition will be maintained Outcome: Progressing   

## 2024-04-16 NOTE — Progress Notes (Signed)
 PROGRESS NOTE        PATIENT DETAILS Name: Derrick White Age: 83 y.o. Sex: male Date of Birth: Dec 08, 1940 Admit Date: 04/13/2024 Admitting Physician [name], MD ERE:Updnczr, [name], MD  Brief Summary: Patient is a 83 y.o.  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  Significant events: 11/25>> admit to [facility].  Significant studies: 11/25>> x-ray lumbar spine: L2 compression fracture. 11/25>> x-ray thoracic spine: No acute abnormality. 11/25>> CT head: No acute intracranial abnormality. 11/25>> CT C-spine: No fracture, anterior fusion C3-C6. 11/25>> CXR: Vascular congestion. 11/25>> CTA chest: No PE, mild emphysema. 11/25>> x-ray abdomen: No acute findings, moderate colonic stool burden. 11/26>> CT lumbar spine: Moderate acute appearing compression fracture involving L2.  Significant microbiology data: None  Procedures: None  Consults: None  Subjective: No major issues overnight-stable on 2-3 L of oxygen this morning.  Objective: Vitals: Blood pressure (!) 111/55, pulse (!) 58, temperature (!) 97.5 F (36.4 C), temperature source Oral, resp. rate 17, height 5' 10 (1.778 m), weight 74.8 kg, SpO2 (!) 89%.   Exam: Awake/alert-some chronic dysarthria at baseline Chest: Few bibasilar rales Abdomen: Soft nontender nondistended Extremities: No edema Neurology: Nonfocal but moving all 4 extremities.  Gait not tested.  Pertinent Labs/Radiology:    Latest Ref Rng & Units 04/16/2024    4:19 AM 04/14/2024    2:44 AM  04/13/2024    3:18 AM  CBC  WBC 4.0 - 10.5 K/uL 8.4  11.6  13.6   Hemoglobin 13.0 - 17.0 g/dL 87.8  86.0  86.9   Hematocrit 39.0 - 52.0 % 35.6  40.4  36.8   Platelets 150 - 400 K/uL 223  236  243     Lab Results  Component Value Date   NA 139 04/16/2024   K 3.9 04/16/2024   CL 103 04/16/2024   CO2 24 04/16/2024      Assessment/Plan: Acute hypoxic respiratory failure Suspicion for aspiration pneumonia (vomited repeatedly in the ED)-although no frank infiltrates seen on imaging studies Oxygen requirements fluctuating but stable on 3 L of oxygen this morning Remains on Unasyn  1 dose of IV Lasix  to ensure negative balance Appreciate SLP evaluation-seems to be tolerating regular diet.   Presumed aspiration pneumonia See above  Acute L2 compression fracture Secondary to mechanical fall Pain seems to be currently controlled-continue scheduled Tylenol /Lidoderm  patch As needed Flexeril /Toradol  Avoid narcotics as much as possible given issues with sedation/vomiting resulting in aspiration Use TSLO brace as much as possible Outpatient follow-up with neurosurgery.  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  AKI Mild Probably secondary to recent nausea/vomiting Resolved with supportive care.  Paroxysmal atrial fibrillation-s/p watchman's device 2022. Maintaining sinus rhythm Received epic text message from Dr. Vina Gull (patient's primary cardiologist)-on 11/26-recommendations are to stop amiodarone  (given respiratory failure) and to check ESR. Not on anticoagulation-as has watchman's device.  History of CVA 2021 Supportive care No anticoagulation as has watchman's device.  HTN BP was soft-no longer on amlodipine  Continue  midodrine  for BP support.   HLD Lipitor  Constipation Stool burden seen on CT imaging Continue MiraLAX /senna Dulcolax suppository x 1  today.  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  Appreciate SLP/PT/OT input-tolerating regular diet-SNF planned on discharge.  Palliative care Spoke with daughter [name] over the phone on 11/26-significant decline in function over the past several months-barely ambulatory-issues with dysphagia-poor oral intake.  Per [name]-patient never wanted any heroic measures.  Agreeable for DNR.  Subsequently confirmed with patient at bedside as well. Will need outpatient palliative care follow-up-currently stable-and does not appear to be deteriorating.  Code status:   Code Status: Limited: Do not attempt resuscitation (DNR) -DNR-LIMITED -Do Not Intubate/DNI    DVT Prophylaxis: enoxaparin  (LOVENOX ) injection 40 mg Start: 04/13/24 2215   Family Communication: Daughter -[name]-[phone] updated 11/26   Disposition Plan: Status is: Inpatient Remains inpatient appropriate because: Severity of illness   Planned Discharge Destination:Home health versus SNF   Diet: Diet Order             Diet regular Room service appropriate? No; Fluid consistency: Thin  Diet effective now                     Antimicrobial agents: Anti-infectives (From admission, onward)    Start     Dose/Rate Route Frequency Ordered Stop   04/13/24 2230  ampicillin -sulbactam (UNASYN ) 1.5 g in sodium chloride  0.9 % 100 mL IVPB        1.5 g 200 mL/hr over 30 Minutes Intravenous Every 6 hours 04/13/24 2129          MEDICATIONS: Scheduled Meds:  acetaminophen   1,000 mg Oral Q8H   aspirin  EC  81 mg Oral Daily   atorvastatin   40 mg Oral Daily   Chlorhexidine  Gluconate Cloth  6 each Topical Daily   enoxaparin  (LOVENOX ) injection  40 mg Subcutaneous Q24H   feeding supplement  237 mL Oral BID BM   lidocaine   2  patch Transdermal Q24H   midodrine   7.5 mg Oral TID WC   polyethylene glycol  17 g Oral BID   senna-docusate  2 tablet Oral QHS   sodium chloride  flush  3 mL Intravenous Q12H   tamsulosin   0.4 mg Oral Daily   Continuous Infusions:  ampicillin -sulbactam (UNASYN ) IV 1.5 g (04/16/24 0558)   PRN Meds:.bisacodyl , cyclobenzaprine , ipratropium-albuterol , ketorolac , prochlorperazine    I have personally reviewed following labs and imaging studies  LABORATORY DATA: CBC: Recent Labs  Lab 04/13/24 0318 04/14/24 0244 04/16/24 0419  WBC 13.6* 11.6* 8.4  NEUTROABS 10.9*  --   --   HGB 13.0 13.9 12.1*  HCT 36.8* 40.4 35.6*  MCV 94.1 94.8 96.2  PLT 243 236 223    Basic Metabolic Panel: Recent Labs  Lab 04/13/24 0318 04/13/24 1600 04/14/24 0244 04/15/24 0326 04/16/24 0419  NA 136  --  138 136 139  K 3.8  --  4.3 3.7 3.9  CL 104  --  102 103 103  CO2 21*  --  26 24 24   GLUCOSE 155*  --  123* 117* 111*  BUN 20  --  18 27* 30*  CREATININE 1.31*  --  1.36* 1.15 1.19  CALCIUM  9.0  --  8.9 8.3* 8.5*  MG  --  1.6* 2.0  --   --     GFR: Estimated Creatinine Clearance: 48.6 mL/min (  by C-G formula based on SCr of 1.19 mg/dL).  Liver Function Tests: Recent Labs  Lab 04/14/24 0244  AST 32  ALT 37  ALKPHOS 28*  BILITOT 1.9*  PROT 6.8  ALBUMIN 3.5   No results for input(s): LIPASE, AMYLASE in the last 168 hours. No results for input(s): AMMONIA in the last 168 hours.  Coagulation Profile: No results for input(s): INR, PROTIME in the last 168 hours.  Cardiac Enzymes: No results for input(s): CKTOTAL, CKMB, CKMBINDEX, TROPONINI in the last 168 hours.  BNP (last 3 results) No results for input(s): PROBNP in the last 8760 hours.  Lipid Profile: No results for input(s): CHOL, HDL, LDLCALC, TRIG, CHOLHDL, LDLDIRECT in the last 72 hours.  Thyroid  Function Tests: No results for input(s): TSH, T4TOTAL, FREET4, T3FREE, THYROIDAB in the  last 72 hours.  Anemia Panel: No results for input(s): VITAMINB12, FOLATE, FERRITIN, TIBC, IRON, RETICCTPCT in the last 72 hours.  Urine analysis:    Component Value Date/Time   COLORURINE YELLOW 04/13/2024 0554   APPEARANCEUR CLEAR 04/13/2024 0554   LABSPEC 1.018 04/13/2024 0554   PHURINE 5.0 04/13/2024 0554   GLUCOSEU NEGATIVE 04/13/2024 0554   HGBUR NEGATIVE 04/13/2024 0554   BILIRUBINUR NEGATIVE 04/13/2024 0554   KETONESUR 5 (A) 04/13/2024 0554   PROTEINUR NEGATIVE 04/13/2024 0554   UROBILINOGEN 0.2 04/29/2009 1117   NITRITE NEGATIVE 04/13/2024 0554   LEUKOCYTESUR NEGATIVE 04/13/2024 0554    Sepsis Labs: Lactic Acid, Venous    Component Value Date/Time   LATICACIDVEN 0.8 04/15/2024 0542    MICROBIOLOGY: No results found for this or any previous visit (from the past 240 hours).  RADIOLOGY STUDIES/RESULTS: DG Chest Port 1 View Result Date: 04/16/2024 EXAM: 1 VIEW(S) XRAY OF THE CHEST 04/16/2024 06:14:00 AM COMPARISON: Portable chest 04/14/2024. CLINICAL HISTORY: SOB (shortness of breath). FINDINGS: LUNGS AND PLEURA: Mildly elevated right hemidiaphragm. Linear atelectasis developed in the outer right upper lobe. No pleural effusion. No pneumothorax. No focal infiltrate is seen. HEART AND MEDIASTINUM: Calcification in the transverse aorta with a stable mediastinum. The cardiac size is normal. BONES AND SOFT TISSUES: No new osseous findings. Asymmetric shoulder degenerative joint disease noted. Multilevel partially visible cervical ACDF (anterior cervical discectomy and fusion) plating. IMPRESSION: 1. Linear atelectasis in the outer right upper lobe, without appreciable pneumonia. Electronically signed by: Francis Quam MD 04/16/2024 07:44 AM EST RP Workstation: [id number]     LOS: 3 days   Donalda Applebaum, MD  [facility]    To contact the attending provider between 7A-7P or the covering provider during after hours 7P-7A, please log into the web site  [email or web address] and access using universal [place] password for that web site. If you do not have the password, please call the hospital operator.  04/16/2024, 9:25 AM

## 2024-04-17 ENCOUNTER — Inpatient Hospital Stay (HOSPITAL_COMMUNITY)

## 2024-04-17 DIAGNOSIS — I700 Atherosclerosis of aorta: Secondary | ICD-10-CM | POA: Diagnosis not present

## 2024-04-17 DIAGNOSIS — R918 Other nonspecific abnormal finding of lung field: Secondary | ICD-10-CM | POA: Diagnosis not present

## 2024-04-17 DIAGNOSIS — J9601 Acute respiratory failure with hypoxia: Secondary | ICD-10-CM | POA: Diagnosis not present

## 2024-04-17 DIAGNOSIS — I480 Paroxysmal atrial fibrillation: Secondary | ICD-10-CM | POA: Diagnosis not present

## 2024-04-17 DIAGNOSIS — R0602 Shortness of breath: Secondary | ICD-10-CM | POA: Diagnosis not present

## 2024-04-17 DIAGNOSIS — S32020A Wedge compression fracture of second lumbar vertebra, initial encounter for closed fracture: Secondary | ICD-10-CM | POA: Diagnosis not present

## 2024-04-17 DIAGNOSIS — I10 Essential (primary) hypertension: Secondary | ICD-10-CM | POA: Diagnosis not present

## 2024-04-17 LAB — MAGNESIUM: Magnesium: 1.8 mg/dL (ref 1.7–2.4)

## 2024-04-17 LAB — CBC WITH DIFFERENTIAL/PLATELET
Abs Immature Granulocytes: 0.03 K/uL (ref 0.00–0.07)
Basophils Absolute: 0 K/uL (ref 0.0–0.1)
Basophils Relative: 1 %
Eosinophils Absolute: 0.2 K/uL (ref 0.0–0.5)
Eosinophils Relative: 4 %
HCT: 34.6 % — ABNORMAL LOW (ref 39.0–52.0)
Hemoglobin: 12 g/dL — ABNORMAL LOW (ref 13.0–17.0)
Immature Granulocytes: 1 %
Lymphocytes Relative: 25 %
Lymphs Abs: 1.6 K/uL (ref 0.7–4.0)
MCH: 33 pg (ref 26.0–34.0)
MCHC: 34.7 g/dL (ref 30.0–36.0)
MCV: 95.1 fL (ref 80.0–100.0)
Monocytes Absolute: 1.1 K/uL — ABNORMAL HIGH (ref 0.1–1.0)
Monocytes Relative: 17 %
Neutro Abs: 3.4 K/uL (ref 1.7–7.7)
Neutrophils Relative %: 52 %
Platelets: 231 K/uL (ref 150–400)
RBC: 3.64 MIL/uL — ABNORMAL LOW (ref 4.22–5.81)
RDW: 12.2 % (ref 11.5–15.5)
WBC: 6.4 K/uL (ref 4.0–10.5)
nRBC: 0 % (ref 0.0–0.2)

## 2024-04-17 LAB — BASIC METABOLIC PANEL WITH GFR
Anion gap: 12 (ref 5–15)
BUN: 28 mg/dL — ABNORMAL HIGH (ref 8–23)
CO2: 26 mmol/L (ref 22–32)
Calcium: 8.5 mg/dL — ABNORMAL LOW (ref 8.9–10.3)
Chloride: 102 mmol/L (ref 98–111)
Creatinine, Ser: 1.09 mg/dL (ref 0.61–1.24)
GFR, Estimated: >60 mL/min (ref >60)
Glucose, Bld: 89 mg/dL (ref 70–99)
Potassium: 3.5 mmol/L (ref 3.5–5.1)
Sodium: 140 mmol/L (ref 135–145)

## 2024-04-17 LAB — PROCALCITONIN: Procalcitonin: <0.1 ng/mL

## 2024-04-17 LAB — C-REACTIVE PROTEIN: CRP: 4.7 mg/dL — ABNORMAL HIGH (ref <1.0)

## 2024-04-17 LAB — BRAIN NATRIURETIC PEPTIDE: B Natriuretic Peptide: 110.3 pg/mL — ABNORMAL HIGH (ref 0.0–100.0)

## 2024-04-17 MED ORDER — FAMOTIDINE 20 MG PO TABS
20.0000 mg | ORAL_TABLET | Freq: Every day | ORAL | Status: DC
  Administered 2024-04-17 – 2024-04-21 (×5): 20 mg via ORAL
  Filled 2024-04-17 (×5): qty 1

## 2024-04-17 MED ORDER — SODIUM CHLORIDE 0.9 % IV SOLN
2.0000 g | INTRAVENOUS | Status: DC
  Administered 2024-04-18 – 2024-04-21 (×4): 2 g via INTRAVENOUS
  Filled 2024-04-17 (×4): qty 20

## 2024-04-17 MED ORDER — METRONIDAZOLE 500 MG/100ML IV SOLN
500.0000 mg | Freq: Two times a day (BID) | INTRAVENOUS | Status: DC
  Administered 2024-04-18 – 2024-04-21 (×7): 500 mg via INTRAVENOUS
  Filled 2024-04-17 (×7): qty 100

## 2024-04-17 MED ORDER — CALAMINE EX LOTN
1.0000 | TOPICAL_LOTION | CUTANEOUS | Status: DC | PRN
  Administered 2024-04-17: 1 via TOPICAL
  Filled 2024-04-17: qty 177

## 2024-04-17 NOTE — Plan of Care (Signed)

## 2024-04-17 NOTE — Progress Notes (Signed)
 PROGRESS NOTE        PATIENT DETAILS Name: Derrick White Age: 83 y.o. Sex: male Date of Birth: 1940/10/22 Admit Date: 04/13/2024 Admitting Physician Derrick JONELLE Blanch, MD ERE:Updnczr, Derrick ORN, MD  Brief Summary: Patient is a 83 y.o.  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  Significant events: 11/25>> admit to [facility].  Significant studies: 11/25>> x-ray lumbar spine: L2 compression fracture. 11/25>> x-ray thoracic spine: No acute abnormality. 11/25>> CT head: No acute intracranial abnormality. 11/25>> CT C-spine: No fracture, anterior fusion C3-C6. 11/25>> CXR: Vascular congestion. 11/25>> CTA chest: No PE, mild emphysema. 11/25>> x-ray abdomen: No acute findings, moderate colonic stool burden. 11/26>> CT lumbar spine: Moderate acute appearing compression fracture involving L2. 11/29>> CXR: Subtle airspace disease-left mid/left lower lung fields.  Significant microbiology data: None  Procedures: None  Consults: None  Subjective: Awake/alert-feels better-stable on 2-3 L of oxygen this morning.  Objective: Vitals: Blood pressure (!) 120/57, pulse 61, temperature (!) 97.3 F (36.3 C), temperature source Oral, resp. rate 15, height 5' 10 (1.778 m), weight 74.8 kg, SpO2 (!) 89%.   Exam: Awake/alert Chest: Bibasilar rales CVS: S1-S2 regular Abdomen: Soft nontender Extremities: No edema  Pertinent Labs/Radiology:    Latest Ref Rng & Units 04/17/2024    5:07 AM 04/16/2024    4:19 AM 04/14/2024    2:44 AM  CBC  WBC 4.0 -  10.5 K/uL 6.4  8.4  11.6   Hemoglobin 13.0 - 17.0 g/dL 87.9  87.8  86.0   Hematocrit 39.0 - 52.0 % 34.6  35.6  40.4   Platelets 150 - 400 K/uL 231  223  236     Lab Results  Component Value Date   NA 140 04/17/2024   K 3.5 04/17/2024   CL 102 04/17/2024   CO2 26 04/17/2024      Assessment/Plan: Acute hypoxic respiratory failure Suspicion for aspiration pneumonia (vomited repeatedly in the ED)-although no frank infiltrates seen on initial imaging studies-however repeat chest x-ray on 11/29 does show infiltrate. FiO2 requirements have been stable-2-3 L this morning-however at night he requires a bit more oxygen (OSA) Remains on Unasyn  Hold diuretics today-his volume status stable-received Lasix  on 11/28 Appreciate SLP evaluation-seems to be tolerating regular diet. May require short-term oxygen on discharge to SNF.  Presumed aspiration pneumonia See above  Acute L2 compression fracture Secondary to mechanical fall Pain seems to be currently controlled-continue scheduled Tylenol /Lidoderm  patch As needed Flexeril /Toradol  Avoid narcotics as much as possible given issues with sedation/vomiting resulting in aspiration Use TSLO brace as much as possible Outpatient follow-up with neurosurgery.  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  AKI Mild Probably secondary to recent nausea/vomiting Resolved with supportive care.  Paroxysmal atrial fibrillation-s/p watchman's device 2022. Maintaining sinus rhythm Received epic text message from Dr. Vina White (patient's primary cardiologist)-on 11/26-recommendations are to stop amiodarone  (given respiratory failure) and check ESR which was stable. Not on anticoagulation-as has watchman's device.  History of CVA 2021 Supportive care  No anticoagulation as has watchman's device.  HTN BP was soft-no longer on  amlodipine  Continue midodrine  for BP support.  HLD Lipitor  Constipation Stool burden seen on CT imaging Continue MiraLAX /senna Dulcolax suppository x 1 today.  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  Appreciate SLP/PT/OT input-tolerating regular diet-SNF planned on discharge.  Palliative care Spoke with daughter Derrick White over the phone on 11/26-significant decline in function over the past several months-barely ambulatory-issues with dysphagia-poor oral intake.  Per Derrick White-patient never wanted any heroic measures.  Agreeable for DNR.  Subsequently confirmed with patient at bedside as well. Will need outpatient palliative care follow-up-currently stable-and does not appear to be deteriorating.  Code status:   Code Status: Limited: Do not attempt resuscitation (DNR) -DNR-LIMITED -Do Not Intubate/DNI    DVT Prophylaxis: enoxaparin  (LOVENOX ) injection 40 mg Start: 04/13/24 2215   Family Communication: Daughter -Derrick White-[phone] updated 11/26   Disposition Plan: Status is: Inpatient Remains inpatient appropriate because: Severity of illness   Planned Discharge Destination:Home health versus SNF   Diet: Diet Order             Diet regular Room service appropriate? No; Fluid consistency: Thin  Diet effective now                     Antimicrobial agents: Anti-infectives (From admission, onward)    Start     Dose/Rate Route Frequency Ordered Stop   04/13/24 2230  ampicillin -sulbactam (UNASYN ) 1.5 g in sodium chloride  0.9 % 100 mL IVPB        1.5 g 200 mL/hr over 30 Minutes Intravenous Every 6 hours 04/13/24 2129          MEDICATIONS: Scheduled Meds:  acetaminophen   1,000 mg Oral Q8H   aspirin  EC  81 mg Oral Daily   atorvastatin   40 mg Oral Daily    Chlorhexidine  Gluconate Cloth  6 each Topical Daily   enoxaparin  (LOVENOX ) injection  40 mg Subcutaneous Q24H   feeding supplement  237 mL Oral BID BM   lidocaine   2 patch Transdermal Q24H   midodrine   7.5 mg Oral TID WC   polyethylene glycol  17 g Oral BID   senna-docusate  2 tablet Oral QHS   sodium chloride  flush  3 mL Intravenous Q12H   tamsulosin   0.4 mg Oral Daily   Continuous Infusions:  ampicillin -sulbactam (UNASYN ) IV 1.5 g (04/17/24 0614)   PRN Meds:.bisacodyl , cyclobenzaprine , hydrOXYzine, ipratropium-albuterol , ketorolac , prochlorperazine    I have personally reviewed following labs and imaging studies  LABORATORY DATA: CBC: Recent Labs  Lab 04/13/24 0318 04/14/24 0244 04/16/24 0419 04/17/24 0507  WBC 13.6* 11.6* 8.4 6.4  NEUTROABS 10.9*  --   --  3.4  HGB 13.0 13.9 12.1* 12.0*  HCT 36.8* 40.4 35.6* 34.6*  MCV 94.1 94.8 96.2 95.1  PLT 243 236 223 231    Basic Metabolic Panel: Recent Labs  Lab 04/13/24 0318 04/13/24 1600 04/14/24 0244 04/15/24 0326 04/16/24 0419 04/17/24 0507  NA 136  --  138 136 139 140  K 3.8  --  4.3 3.7 3.9 3.5  CL 104  --  102 103 103 102  CO2 21*  --  26 24 24 26   GLUCOSE 155*  --  123* 117* 111* 89  BUN 20  --  18 27* 30* 28*  CREATININE 1.31*  --  1.36* 1.15 1.19  1.09  CALCIUM  9.0  --  8.9 8.3* 8.5* 8.5*  MG  --  1.6* 2.0  --   --  1.8    GFR: Estimated Creatinine Clearance: 53 mL/min (by C-G formula based on SCr of 1.09 mg/dL).  Liver Function Tests: Recent Labs  Lab 04/14/24 0244  AST 32  ALT 37  ALKPHOS 28*  BILITOT 1.9*  PROT 6.8  ALBUMIN 3.5   No results for input(s): LIPASE, AMYLASE in the last 168 hours. No results for input(s): AMMONIA in the last 168 hours.  Coagulation Profile: No results for input(s): INR, PROTIME in the last 168 hours.  Cardiac Enzymes: No results for input(s): CKTOTAL, CKMB, CKMBINDEX, TROPONINI in the last 168 hours.  BNP (last 3 results) No results for  input(s): PROBNP in the last 8760 hours.  Lipid Profile: No results for input(s): CHOL, HDL, LDLCALC, TRIG, CHOLHDL, LDLDIRECT in the last 72 hours.  Thyroid  Function Tests: No results for input(s): TSH, T4TOTAL, FREET4, T3FREE, THYROIDAB in the last 72 hours.  Anemia Panel: No results for input(s): VITAMINB12, FOLATE, FERRITIN, TIBC, IRON, RETICCTPCT in the last 72 hours.  Urine analysis:    Component Value Date/Time   COLORURINE YELLOW 04/13/2024 0554   APPEARANCEUR CLEAR 04/13/2024 0554   LABSPEC 1.018 04/13/2024 0554   PHURINE 5.0 04/13/2024 0554   GLUCOSEU NEGATIVE 04/13/2024 0554   HGBUR NEGATIVE 04/13/2024 0554   BILIRUBINUR NEGATIVE 04/13/2024 0554   KETONESUR 5 (A) 04/13/2024 0554   PROTEINUR NEGATIVE 04/13/2024 0554   UROBILINOGEN 0.2 04/29/2009 1117   NITRITE NEGATIVE 04/13/2024 0554   LEUKOCYTESUR NEGATIVE 04/13/2024 0554    Sepsis Labs: Lactic Acid, Venous    Component Value Date/Time   LATICACIDVEN 0.8 04/15/2024 0542    MICROBIOLOGY: No results found for this or any previous visit (from the past 240 hours).  RADIOLOGY STUDIES/RESULTS: DG Chest Port 1 View Result Date: 04/17/2024 EXAM: 1 VIEW(S) XRAY OF THE CHEST 04/17/2024 06:03:46 AM COMPARISON: Portable chest yesterday at 06:10 AM. CLINICAL HISTORY: SOB (shortness of breath). FINDINGS: LUNGS AND PLEURA: A subtle airspace infiltrate is suspected developing in the left mid and lower lung fields concerning for pneumonia. The remaining lungs are clear. No pleural effusion is seen. No pneumothorax. HEART AND MEDIASTINUM: The cardiomediastinal silhouette is stable. There is aortic atherosclerosis. BONES AND SOFT TISSUES: Thoracic spondylosis. No acute osseous abnormality. IMPRESSION: 1. Subtle airspace infiltrate suspected developing in the left mid and lower lung fields, concerning for pneumonia. 2. No pleural effusion. 3. Stable cardiomediastinal silhouette. Electronically  signed by: Francis Quam MD 04/17/2024 06:14 AM EST RP Workstation: [id number]   DG Chest Port 1 View Result Date: 04/16/2024 EXAM: 1 VIEW(S) XRAY OF THE CHEST 04/16/2024 06:14:00 AM COMPARISON: Portable chest 04/14/2024. CLINICAL HISTORY: SOB (shortness of breath). FINDINGS: LUNGS AND PLEURA: Mildly elevated right hemidiaphragm. Linear atelectasis developed in the outer right upper lobe. No pleural effusion. No pneumothorax. No focal infiltrate is seen. HEART AND MEDIASTINUM: Calcification in the transverse aorta with a stable mediastinum. The cardiac size is normal. BONES AND SOFT TISSUES: No new osseous findings. Asymmetric shoulder degenerative joint disease noted. Multilevel partially visible cervical ACDF (anterior cervical discectomy and fusion) plating. IMPRESSION: 1. Linear atelectasis in the outer right upper lobe, without appreciable pneumonia. Electronically signed by: Francis Quam MD 04/16/2024 07:44 AM EST RP Workstation: [id number]     LOS: 4 days   Donalda Applebaum, MD  [facility]    To contact the attending provider between 7A-7P or the covering provider during after  hours 7P-7A, please log into the web site [email or web address] and access using universal [place] password for that web site. If you do not have the password, please call the hospital operator.  04/17/2024, 9:47 AM

## 2024-04-18 DIAGNOSIS — J9601 Acute respiratory failure with hypoxia: Secondary | ICD-10-CM | POA: Diagnosis not present

## 2024-04-18 LAB — CBC WITH DIFFERENTIAL/PLATELET
Abs Immature Granulocytes: 0.04 K/uL (ref 0.00–0.07)
Basophils Absolute: 0 K/uL (ref 0.0–0.1)
Basophils Relative: 1 %
Eosinophils Absolute: 0.3 K/uL (ref 0.0–0.5)
Eosinophils Relative: 4 %
HCT: 36 % — ABNORMAL LOW (ref 39.0–52.0)
Hemoglobin: 12.3 g/dL — ABNORMAL LOW (ref 13.0–17.0)
Immature Granulocytes: 1 %
Lymphocytes Relative: 31 %
Lymphs Abs: 2.1 K/uL (ref 0.7–4.0)
MCH: 32.5 pg (ref 26.0–34.0)
MCHC: 34.2 g/dL (ref 30.0–36.0)
MCV: 95 fL (ref 80.0–100.0)
Monocytes Absolute: 1.3 K/uL — ABNORMAL HIGH (ref 0.1–1.0)
Monocytes Relative: 18 %
Neutro Abs: 3.3 K/uL (ref 1.7–7.7)
Neutrophils Relative %: 45 %
Platelets: 261 K/uL (ref 150–400)
RBC: 3.79 MIL/uL — ABNORMAL LOW (ref 4.22–5.81)
RDW: 12 % (ref 11.5–15.5)
WBC: 7 K/uL (ref 4.0–10.5)
nRBC: 0 % (ref 0.0–0.2)

## 2024-04-18 LAB — BASIC METABOLIC PANEL WITH GFR
Anion gap: 14 (ref 5–15)
BUN: 17 mg/dL (ref 8–23)
CO2: 23 mmol/L (ref 22–32)
Calcium: 8.2 mg/dL — ABNORMAL LOW (ref 8.9–10.3)
Chloride: 101 mmol/L (ref 98–111)
Creatinine, Ser: 1.11 mg/dL (ref 0.61–1.24)
GFR, Estimated: >60 mL/min (ref >60)
Glucose, Bld: 91 mg/dL (ref 70–99)
Potassium: 3.7 mmol/L (ref 3.5–5.1)
Sodium: 138 mmol/L (ref 135–145)

## 2024-04-18 LAB — MAGNESIUM: Magnesium: 1.7 mg/dL (ref 1.7–2.4)

## 2024-04-18 MED ORDER — CLOBETASOL PROPIONATE 0.05 % EX CREA
TOPICAL_CREAM | Freq: Three times a day (TID) | CUTANEOUS | Status: AC
  Filled 2024-04-18 (×2): qty 15

## 2024-04-18 NOTE — Progress Notes (Signed)
 PROGRESS NOTE        PATIENT DETAILS Name: Derrick White Age: 83 y.o. Sex: male Date of Birth: 13-Aug-1940 Admit Date: 04/13/2024 Admitting Physician [name], MD ERE:[email or web address], [name], MD  Brief Summary: Patient is a 83 y.o.  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  Significant events: 11/25>> admit to [facility].  Significant studies: 11/25>> x-ray lumbar spine: L2 compression fracture. 11/25>> x-ray thoracic spine: No acute abnormality. 11/25>> CT head: No acute intracranial abnormality. 11/25>> CT C-spine: No fracture, anterior fusion C3-C6. 11/25>> CXR: Vascular congestion. 11/25>> CTA chest: No PE, mild emphysema. 11/25>> x-ray abdomen: No acute findings, moderate colonic stool burden. 11/26>> CT lumbar spine: Moderate acute appearing compression fracture involving L2. 11/29>> CXR: Subtle airspace disease-left mid/left lower lung fields.  Significant microbiology data: None  Procedures: None  Consults: None  Subjective: Patient in bed, appears comfortable, denies any headache, no fever, no chest pain or pressure, no shortness of breath , no abdominal pain. No focal weakness.  Rash and itching in the back has improved  Objective: Vitals: Blood pressure 136/76, pulse 73, temperature 97.9 F (36.6 C), temperature source Oral, resp. rate (!) 22, height 5' 10 (1.778 m), weight 74.8 kg, SpO2 92%.   Exam:  Awake Alert, No new F.N deficits, Normal affect [place].AT,PERRAL Supple Neck, No JVD,   Symmetrical Chest wall movement,  Good air movement bilaterally, CTAB RRR,No Gallops, Rubs or new Murmurs,  +ve B.Sounds, Abd Soft, No tenderness,   Morbilliform itchy rash in the back and upper torso   Assessment/Plan: Acute hypoxic respiratory failure Suspicion for aspiration pneumonia (vomited repeatedly in the ED)-although no frank infiltrates seen on initial imaging studies-however repeat chest x-ray on 11/29 does show infiltrate. FiO2 requirements have been stable-2-3 L this morning-however at night he requires a bit more oxygen (OSA) Was on Unasyn  but switched to Rocephin and Flagyl on 04/17/2024 due to morbilliform rash caused by Unasyn  Hold diuretics today-his volume status stable-received Lasix  on 11/28 Appreciate SLP evaluation-seems to be tolerating regular diet. May require short-term oxygen on discharge to SNF.  Morbilliform itchy rash in the upper back developed 04/17/2024 likely Unasyn  allergy.  Antibiotic switched as above, supportive care with steroid cream, received Benadryl and IV H2 blocker on 04/17/2024.  Improved.     Presumed aspiration pneumonia See above  Acute L2 compression fracture Secondary to mechanical fall Pain seems to be currently controlled-continue scheduled Tylenol /Lidoderm  patch As needed Flexeril /Toradol  Avoid narcotics as much as possible given issues with sedation/vomiting resulting in aspiration Use TSLO brace as much as possible Outpatient follow-up with neurosurgery.  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  AKI Mild Probably secondary to recent nausea/vomiting Resolved with supportive care.  Paroxysmal atrial fibrillation-s/p watchman's device 2022. Maintaining sinus rhythm Received epic text message from Dr. Vina Gull (patient's primary cardiologist)-on 11/26-recommendations are to stop amiodarone  (given respiratory failure) and check ESR which was  stable. Not on anticoagulation-as has watchman's device.  History of CVA 2021 Supportive  care No anticoagulation as has watchman's device.  HTN BP was soft-no longer on amlodipine  Continue midodrine  for BP support.  HLD Lipitor  Constipation Stool burden seen on CT imaging Continue MiraLAX /senna Dulcolax suppository x 1 today.  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  Appreciate SLP/PT/OT input-tolerating regular diet-SNF planned on discharge.  Palliative care Spoke with daughter [name] over the phone on 11/26-significant decline in function over the past several months-barely ambulatory-issues with dysphagia-poor oral intake.  Per [name]-patient never wanted any heroic measures.  Agreeable for DNR.  Subsequently confirmed with patient at bedside as well. Will need outpatient palliative care follow-up-currently stable-and does not appear to be deteriorating.  Code status:   Code Status: Limited: Do not attempt resuscitation (DNR) -DNR-LIMITED -Do Not Intubate/DNI    DVT Prophylaxis: enoxaparin  (LOVENOX ) injection 40 mg Start: 04/13/24 2215   Family Communication: Daughter -[name]-[phone] updated 11/26   Disposition Plan: Status is: Inpatient Remains inpatient appropriate because: Severity of illness   Planned Discharge Destination:Home health versus SNF   Diet: Diet Order             Diet regular Room service appropriate? No; Fluid consistency: Thin  Diet effective now                     Antimicrobial agents: Anti-infectives (From admission, onward)    Start     Dose/Rate Route Frequency Ordered Stop   04/18/24 0300  metroNIDAZOLE (FLAGYL) IVPB 500 mg        500 mg 100 mL/hr over 60 Minutes Intravenous Every 12 hours 04/17/24 1821     04/18/24 0100   cefTRIAXone (ROCEPHIN) 2 g in sodium chloride  0.9 % 100 mL IVPB        2 g 200 mL/hr over 30 Minutes Intravenous Every 24 hours 04/17/24 1821     04/13/24 2230  ampicillin -sulbactam (UNASYN ) 1.5 g in sodium chloride  0.9 % 100 mL IVPB  Status:  Discontinued        1.5 g 200 mL/hr over 30 Minutes Intravenous Every 6 hours 04/13/24 2129 04/17/24 1821        MEDICATIONS: Scheduled Meds:  acetaminophen   1,000 mg Oral Q8H   aspirin  EC  81 mg Oral Daily   atorvastatin   40 mg Oral Daily   Chlorhexidine  Gluconate Cloth  6 each Topical Daily   enoxaparin  (LOVENOX ) injection  40 mg Subcutaneous Q24H   famotidine  20 mg Oral Daily   feeding supplement  237 mL Oral BID BM   lidocaine   2 patch Transdermal Q24H   midodrine   7.5 mg Oral TID WC   polyethylene glycol  17 g Oral BID   senna-docusate  2 tablet Oral QHS   sodium chloride  flush  3 mL Intravenous Q12H   tamsulosin   0.4 mg Oral Daily   Continuous Infusions:  cefTRIAXone (ROCEPHIN)  IV Stopped (04/18/24 0115)   metronidazole Stopped (04/18/24 0352)   PRN Meds:.bisacodyl , calamine, cyclobenzaprine , hydrOXYzine, ipratropium-albuterol , ketorolac , prochlorperazine    I have personally reviewed following labs and imaging studies  LABORATORY DATA: CBC: Recent Labs  Lab 04/13/24 0318 04/14/24 0244 04/16/24 0419 04/17/24 0507 04/18/24 0415  WBC 13.6* 11.6* 8.4 6.4 7.0  NEUTROABS 10.9*  --   --  3.4 3.3  HGB 13.0 13.9 12.1* 12.0* 12.3*  HCT 36.8* 40.4 35.6* 34.6* 36.0*  MCV 94.1 94.8 96.2 95.1 95.0  PLT 243 236 223 231 261    Basic Metabolic Panel: Recent Labs  Lab 04/13/24 1600 04/14/24 0244 04/15/24 0326 04/16/24 0419 04/17/24 0507 04/18/24 0415  NA  --  138 136 139 140 138  K  --  4.3 3.7 3.9 3.5 3.7  CL  --  102 103 103 102 101  CO2  --  26 24 24 26 23   GLUCOSE  --  123* 117* 111* 89 91  BUN  --  18 27* 30* 28* 17  CREATININE  --  1.36* 1.15 1.19 1.09 1.11  CALCIUM   --  8.9 8.3* 8.5* 8.5* 8.2*  MG 1.6* 2.0  --    --  1.8 1.7    GFR: Estimated Creatinine Clearance: 52.1 mL/min (by C-G formula based on SCr of 1.11 mg/dL).  Liver Function Tests: Recent Labs  Lab 04/14/24 0244  AST 32  ALT 37  ALKPHOS 28*  BILITOT 1.9*  PROT 6.8  ALBUMIN 3.5   No results for input(s): LIPASE, AMYLASE in the last 168 hours. No results for input(s): AMMONIA in the last 168 hours.  Coagulation Profile: No results for input(s): INR, PROTIME in the last 168 hours.  Cardiac Enzymes: No results for input(s): CKTOTAL, CKMB, CKMBINDEX, TROPONINI in the last 168 hours.  BNP (last 3 results) No results for input(s): PROBNP in the last 8760 hours.  Lipid Profile: No results for input(s): CHOL, HDL, LDLCALC, TRIG, CHOLHDL, LDLDIRECT in the last 72 hours.  Thyroid  Function Tests: No results for input(s): TSH, T4TOTAL, FREET4, T3FREE, THYROIDAB in the last 72 hours.  Anemia Panel: No results for input(s): VITAMINB12, FOLATE, FERRITIN, TIBC, IRON, RETICCTPCT in the last 72 hours.  Urine analysis:    Component Value Date/Time   COLORURINE YELLOW 04/13/2024 0554   APPEARANCEUR CLEAR 04/13/2024 0554   LABSPEC 1.018 04/13/2024 0554   PHURINE 5.0 04/13/2024 0554   GLUCOSEU NEGATIVE 04/13/2024 0554   HGBUR NEGATIVE 04/13/2024 0554   BILIRUBINUR NEGATIVE 04/13/2024 0554   KETONESUR 5 (A) 04/13/2024 0554   PROTEINUR NEGATIVE 04/13/2024 0554   UROBILINOGEN 0.2 04/29/2009 1117   NITRITE NEGATIVE 04/13/2024 0554   LEUKOCYTESUR NEGATIVE 04/13/2024 0554    Sepsis Labs: Lactic Acid, Venous    Component Value Date/Time   LATICACIDVEN 0.8 04/15/2024 0542    MICROBIOLOGY: No results found for this or any previous visit (from the past 240 hours).  RADIOLOGY STUDIES/RESULTS: DG Chest Port 1 View Result Date: 04/17/2024 EXAM: 1 VIEW(S) XRAY OF THE CHEST 04/17/2024 06:03:46 AM COMPARISON: Portable chest yesterday at 06:10 AM. CLINICAL HISTORY: SOB (shortness of  breath). FINDINGS: LUNGS AND PLEURA: A subtle airspace infiltrate is suspected developing in the left mid and lower lung fields concerning for pneumonia. The remaining lungs are clear. No pleural effusion is seen. No pneumothorax. HEART AND MEDIASTINUM: The cardiomediastinal silhouette is stable. There is aortic atherosclerosis. BONES AND SOFT TISSUES: Thoracic spondylosis. No acute osseous abnormality. IMPRESSION: 1. Subtle airspace infiltrate suspected developing in the left mid and lower lung fields, concerning for pneumonia. 2. No pleural effusion. 3. Stable cardiomediastinal silhouette. Electronically signed by: Francis Quam MD 04/17/2024 06:14 AM EST RP Workstation: [id number]     LOS: 5 days   Lavada Stank, MD  [facility]    To contact the attending provider between 7A-7P or the covering provider during after hours 7P-7A, please log into the web site [email or web address] and access using universal [place] password for that web site. If you do not have the password, please call the  hospital operator.  04/18/2024, 8:49 AM

## 2024-04-18 NOTE — Progress Notes (Signed)
 Pt had soft BP last night, remains asymptomatic, provider notified.  Pt also c/o rash all over back and PRN calamine lotion applied.  Provider notified to assess rash.  VSS, will continue to monitor.

## 2024-04-18 NOTE — Plan of Care (Signed)

## 2024-04-19 DIAGNOSIS — J9601 Acute respiratory failure with hypoxia: Secondary | ICD-10-CM | POA: Diagnosis not present

## 2024-04-19 LAB — CBC WITH DIFFERENTIAL/PLATELET
Abs Immature Granulocytes: 0.04 K/uL (ref 0.00–0.07)
Basophils Absolute: 0 K/uL (ref 0.0–0.1)
Basophils Relative: 0 %
Eosinophils Absolute: 0.2 K/uL (ref 0.0–0.5)
Eosinophils Relative: 4 %
HCT: 35.4 % — ABNORMAL LOW (ref 39.0–52.0)
Hemoglobin: 12.2 g/dL — ABNORMAL LOW (ref 13.0–17.0)
Immature Granulocytes: 1 %
Lymphocytes Relative: 27 %
Lymphs Abs: 1.7 K/uL (ref 0.7–4.0)
MCH: 32.9 pg (ref 26.0–34.0)
MCHC: 34.5 g/dL (ref 30.0–36.0)
MCV: 95.4 fL (ref 80.0–100.0)
Monocytes Absolute: 1.2 K/uL — ABNORMAL HIGH (ref 0.1–1.0)
Monocytes Relative: 18 %
Neutro Abs: 3.3 K/uL (ref 1.7–7.7)
Neutrophils Relative %: 50 %
Platelets: 272 K/uL (ref 150–400)
RBC: 3.71 MIL/uL — ABNORMAL LOW (ref 4.22–5.81)
RDW: 12 % (ref 11.5–15.5)
WBC: 6.4 K/uL (ref 4.0–10.5)
nRBC: 0 % (ref 0.0–0.2)

## 2024-04-19 LAB — BASIC METABOLIC PANEL WITH GFR
Anion gap: 8 (ref 5–15)
BUN: 17 mg/dL (ref 8–23)
CO2: 24 mmol/L (ref 22–32)
Calcium: 8.4 mg/dL — ABNORMAL LOW (ref 8.9–10.3)
Chloride: 105 mmol/L (ref 98–111)
Creatinine, Ser: 1.04 mg/dL (ref 0.61–1.24)
GFR, Estimated: >60 mL/min
Glucose, Bld: 100 mg/dL — ABNORMAL HIGH (ref 70–99)
Potassium: 3.7 mmol/L (ref 3.5–5.1)
Sodium: 137 mmol/L (ref 135–145)

## 2024-04-19 LAB — MAGNESIUM: Magnesium: 1.7 mg/dL (ref 1.7–2.4)

## 2024-04-19 MED ORDER — MAGNESIUM SULFATE IN D5W 1-5 GM/100ML-% IV SOLN
1.0000 g | Freq: Once | INTRAVENOUS | Status: AC
  Administered 2024-04-19: 1 g via INTRAVENOUS
  Filled 2024-04-19: qty 100

## 2024-04-19 NOTE — TOC Progression Note (Signed)
 Transition of Care [facility]) - Progression Note    Patient Details  Name: Derrick White MRN: [id number] Date of Birth: [birth year]  [facility] CM/SW Contact  Bernardino Dean, [place] Phone Number: 04/19/2024, 11:40 AM  Clinical Narrative:    Patient potentially targeted or discharge tomorrow. CSW called down to [employer] to restart authorization for [facility] and secure ambulance [place] tomorrow. Await determination.    Expected Discharge Plan: Skilled Nursing Facility Barriers to Discharge: Continued Medical Work up, English As A Second Language Teacher               Expected Discharge Plan and Services In-house Referral: Clinical Social Work   Post Acute Care Choice: Skilled Nursing Facility                                         Social Drivers of Health (SDOH) Interventions SDOH Screenings   Food Insecurity: Patient Unable To Answer (04/13/2024)  Housing: Patient Unable To Answer (04/13/2024)  Transportation Needs: Patient Unable To Answer (04/13/2024)  Utilities: Patient Unable To Answer (04/13/2024)  Social Connections: Patient Unable To Answer (04/13/2024)  Tobacco Use: Medium Risk (03/24/2024)    Readmission Risk Interventions     No data to display

## 2024-04-19 NOTE — TOC Progression Note (Signed)
 Transition of Care [facility]) - Progression Note    Patient Details  Name: Derrick White MRN: [id number] Date of Birth: October 28, 1940  Transition of [facility]) CM/SW Contact  Inocente GORMAN Kindle, LCSW Phone Number: 04/19/2024, 12:38 PM  Clinical Narrative:    CSW met with patient and friend at bedside with patient's verbal permission. Patient reported being hopeful for discharge tomorrow to [facility] pending insurance approval. [place] aware.    Expected Discharge Plan: Skilled Nursing Facility Barriers to Discharge: Continued Medical Work up, English As A Second Language Teacher               Expected Discharge Plan and Services In-house Referral: Clinical Social Work   Post Acute Care Choice: Skilled Nursing Facility                                         Social Drivers of Health (SDOH) Interventions SDOH Screenings   Food Insecurity: Patient Unable To Answer (04/13/2024)  Housing: Patient Unable To Answer (04/13/2024)  Transportation Needs: Patient Unable To Answer (04/13/2024)  Utilities: Patient Unable To Answer (04/13/2024)  Social Connections: Patient Unable To Answer (04/13/2024)  Tobacco Use: Medium Risk (03/24/2024)    Readmission Risk Interventions     No data to display

## 2024-04-19 NOTE — Progress Notes (Signed)
 Occupational Therapy Treatment Patient Details Name: Derrick White MRN: [id number] DOB: 06/18/1940 Today's Date: 04/19/2024   History of present illness 83 yo M adm 04/13/24 after fall with L2 compression fx, aspiration PNA.  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  OT will continue efforts in the acute setting to address deficits, and Patient will benefit from continued inpatient follow up therapy, <3 hours/day.  Current deficits remain.        If plan is discharge home, recommend the following:  A little help with walking and/or transfers;A lot of help with bathing/dressing/bathroom;Assistance with cooking/housework;Direct supervision/assist for medications management;Direct supervision/assist for financial management;Assist for transportation;Help with stairs or ramp for entrance   Equipment Recommendations  BSC/3in1    Recommendations for Other Services      Precautions / Restrictions Precautions Precautions: Fall;Back Recall of Precautions/Restrictions: Impaired Precaution/Restrictions Comments: cues for log roll Required Braces or Orthoses: Spinal Brace Spinal Brace: Thoracolumbosacral orthotic;Applied in sitting position Restrictions Weight Bearing Restrictions Per Provider Order: No       Mobility Bed Mobility Overal bed mobility: Needs Assistance Bed Mobility: Sidelying to Sit   Sidelying to sit: Contact guard assist, HOB elevated            Transfers Overall transfer level: Needs assistance Equipment used: Rolling walker (2 wheels) Transfers: Sit to/from Stand, Bed to chair/wheelchair/BSC Sit to Stand: Min assist, Contact guard assist     Step pivot transfers: Contact guard assist, Min assist            Balance Overall balance assessment: Needs assistance Sitting-balance support: Feet supported, No upper extremity supported Sitting balance-Leahy Scale: Fair     Standing balance support: Reliant on assistive device for balance Standing balance-Leahy Scale: Poor                             ADL either performed or assessed with clinical judgement   ADL                   Upper Body Dressing : Minimal assistance;Cueing for safety;Sitting   Lower Body Dressing: Moderate assistance;Sit to/from stand   Toilet Transfer: Minimal assistance;Cueing for safety;Rolling walker (2 wheels);Ambulation;Regular Toilet                  Extremity/Trunk Assessment Upper Extremity Assessment Upper Extremity Assessment: Generalized weakness;Right hand dominant RUE Deficits / Details: generalized weakness; decreased coordination at baseline RUE Coordination: decreased fine motor;decreased gross motor LUE Deficits / Details: generalized weakness; decreased coordination at baseline LUE Coordination: decreased fine motor;decreased gross motor   Lower Extremity Assessment Lower Extremity Assessment: Defer to PT evaluation        Vision Baseline Vision/History: 1 Wears glasses Patient Visual Report: No change from baseline     Perception Perception Perception: Not tested   Praxis Praxis Praxis: Not tested   Communication Communication Communication: Impaired Factors Affecting Communication: Reduced clarity of speech   Cognition Arousal: Alert Behavior During Therapy: WFL for tasks assessed/performed Cognition: No family/caregiver present to determine baseline         Attention impairment (select first level of impairment): Alternating attention                     Following commands:  Intact        Cueing   Cueing Techniques: Verbal cues, Gestural cues  Exercises      Shoulder Instructions       General Comments      Pertinent Vitals/  Pain       Pain Assessment Pain Assessment: No/denies pain Pain Intervention(s): Monitored during session                                                          Frequency  Min 2X/week        Progress Toward Goals  OT Goals(current goals can now be found in the care plan section)  Progress towards OT goals: Progressing toward goals  Acute Rehab OT Goals OT Goal Formulation: With patient Time For Goal Achievement: 04/28/24 Potential to Achieve Goals: Good  Plan      Co-evaluation                 AM-PAC OT 6 Clicks Daily Activity     Outcome Measure   Help from another person eating meals?: A Little Help from another person taking care of personal grooming?: A Little Help from another person toileting, which includes using toliet, bedpan, or urinal?: A Lot Help from another person bathing (including washing, rinsing, drying)?: A Lot Help from another person to put on and taking off regular upper body clothing?: A Lot Help from another person to put on and taking off regular lower body clothing?: A Lot 6 Click Score: 14    End of Session Equipment Utilized During Treatment: Rolling walker (2 wheels);Oxygen;Back brace  OT Visit Diagnosis: Unsteadiness on feet (R26.81);Other abnormalities of gait and mobility (R26.89);Muscle weakness (generalized) (M62.81);History of falling (Z91.81);Repeated falls (R29.6);Other symptoms and signs involving cognitive function   Activity Tolerance Patient tolerated treatment well   Patient Left in chair;with call bell/phone within reach   Nurse Communication Mobility status        Time: 1336-1400 OT Time Calculation (min): 24 min  Charges: OT General Charges $OT Visit: 1 Visit OT Treatments $Self Care/Home Management : 23-37 mins  04/19/2024  RP, OTR/L  Acute Rehabilitation Services  Office:  [phone]   Derrick White 04/19/2024, 2:06 PM

## 2024-04-19 NOTE — Progress Notes (Signed)
 PROGRESS NOTE        PATIENT DETAILS Name: Derrick White Age: 83 y.o. Sex: male Date of Birth: 01/09/1941 Admit Date: 04/13/2024 Admitting Physician Derrick JONELLE Blanch, MD ERE:[id number], Derrick ORN, MD  Brief Summary: Patient is a 83 y.o.  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  Significant events: 11/25>> admit to [facility].  Significant studies: 11/25>> x-ray lumbar spine: L2 compression fracture. 11/25>> x-ray thoracic spine: No acute abnormality. 11/25>> CT head: No acute intracranial abnormality. 11/25>> CT C-spine: No fracture, anterior fusion C3-C6. 11/25>> CXR: Vascular congestion. 11/25>> CTA chest: No PE, mild emphysema. 11/25>> x-ray abdomen: No acute findings, moderate colonic stool burden. 11/26>> CT lumbar spine: Moderate acute appearing compression fracture involving L2. 11/29>> CXR: Subtle airspace disease-left mid/left lower lung fields.  Significant microbiology data: None  Procedures: None  Consults: None  Subjective:  Patient in bed, appears comfortable, denies any headache, no fever, no chest pain or pressure, no shortness of breath , no abdominal pain. No new focal weakness.  His rash and itching much improved.   Objective: Vitals: Blood pressure (!) 112/58, pulse 66, temperature 97.9 F (36.6 C), temperature source Axillary, resp. rate 15, height 5' 10 (1.778 m), weight 74.8 kg, SpO2 97%.   Exam:  Awake Alert, No new F.N deficits, Normal affect [place].AT,PERRAL Supple Neck, No JVD,   Symmetrical Chest wall  movement, Good air movement bilaterally, CTAB RRR,No Gallops, Rubs or new Murmurs,  +ve B.Sounds, Abd Soft, No tenderness,   Morbilliform itchy rash in the back and upper torso improving   Assessment/Plan: Acute hypoxic respiratory failure Suspicion for aspiration pneumonia (vomited repeatedly in the ED)-although no frank infiltrates seen on initial imaging studies-however repeat chest x-ray on 11/29 does show infiltrate. FiO2 requirements have been stable-2-3 L this morning-however at night he requires a bit more oxygen (OSA) Was on Unasyn  but switched to Rocephin  and Flagyl  on 04/17/2024 due to morbilliform rash caused by Unasyn  Hold diuretics today-his volume status stable-received Lasix  on 11/28 Appreciate SLP evaluation-seems to be tolerating regular diet. May require short-term oxygen on discharge to SNF.  Morbilliform itchy rash in the upper back developed 04/17/2024 likely Unasyn  allergy.  Antibiotic switched as above, supportive care with steroid cream, received Benadryl and IV H2 blocker on 04/17/2024.  Significantly improved on 04/19/2024.   Presumed aspiration pneumonia See above  Acute L2 compression fracture Secondary to mechanical fall Pain seems to be currently controlled-continue scheduled Tylenol /Lidoderm  patch As needed Flexeril /Toradol  Avoid narcotics as much as possible given issues with sedation/vomiting resulting in aspiration Use TSLO brace as much as possible Outpatient follow-up with neurosurgery.  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  AKI Mild Probably secondary to recent nausea/vomiting Resolved with supportive care.  Paroxysmal atrial fibrillation-s/p watchman's device 2022. Maintaining sinus rhythm Received epic text message from Dr. Vina White (patient's primary cardiologist)-on 11/26-recommendations are to stop amiodarone  (given  respiratory failure) and check ESR which was stable. Not on anticoagulation-as has watchman's device.  History of  CVA 2021 Supportive care No anticoagulation as has watchman's device.  HTN BP was soft-no longer on amlodipine  Continue midodrine  for BP support.  HLD Lipitor  Constipation Stool burden seen on CT imaging Continue MiraLAX /senna Dulcolax suppository x 1 today.  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  Appreciate SLP/PT/OT input-tolerating regular diet-SNF planned on discharge.  Palliative care Spoke with daughter Derrick White over the phone on 11/26-significant decline in function over the past several months-barely ambulatory-issues with dysphagia-poor oral intake.  Per Derrick White-patient never wanted any heroic measures.  Agreeable for DNR.  Subsequently confirmed with patient at bedside as well. Will need outpatient palliative care follow-up-currently stable-and does not appear to be deteriorating.  Code status:   Code Status: Limited: Do not attempt resuscitation (DNR) -DNR-LIMITED -Do Not Intubate/DNI    DVT Prophylaxis: enoxaparin  (LOVENOX ) injection 40 mg Start: 04/13/24 2215   Family Communication: Daughter -Derrick [phone] updated 11/26   Disposition Plan: Status is: Inpatient Remains inpatient appropriate because: Severity of illness   Planned Discharge Destination:Home health versus SNF   Diet: Diet Order             Diet regular Room service appropriate? No; Fluid consistency: Thin  Diet effective now                     Antimicrobial agents: Anti-infectives (From admission, onward)    Start     Dose/Rate Route Frequency Ordered Stop   04/18/24 0300  metroNIDAZOLE (FLAGYL) IVPB 500 mg        500 mg 100 mL/hr over 60 Minutes Intravenous  Every 12 hours 04/17/24 1821     04/18/24 0100  cefTRIAXone (ROCEPHIN) 2 g in sodium chloride  0.9 % 100 mL IVPB        2 g 200 mL/hr over 30 Minutes Intravenous Every 24 hours 04/17/24 1821     04/13/24 2230  ampicillin -sulbactam (UNASYN ) 1.5 g in sodium chloride  0.9 % 100 mL IVPB  Status:  Discontinued        1.5 g 200 mL/hr over 30 Minutes Intravenous Every 6 hours 04/13/24 2129 04/17/24 1821        MEDICATIONS: Scheduled Meds:  acetaminophen   1,000 mg Oral Q8H   aspirin  EC  81 mg Oral Daily   atorvastatin   40 mg Oral Daily   Chlorhexidine  Gluconate Cloth  6 each Topical Daily   clobetasol cream   Topical TID   enoxaparin  (LOVENOX ) injection  40 mg Subcutaneous Q24H   famotidine  20 mg Oral Daily   feeding supplement  237 mL Oral BID BM   lidocaine   2 patch Transdermal Q24H   midodrine   7.5 mg Oral TID WC   polyethylene glycol  17 g Oral BID   senna-docusate  2 tablet Oral QHS   sodium chloride  flush  3 mL Intravenous Q12H   tamsulosin   0.4 mg Oral Daily   Continuous Infusions:  cefTRIAXone (ROCEPHIN)  IV Stopped (04/19/24 0223)   magnesium  sulfate bolus IVPB     metronidazole Stopped (04/19/24 0350)   PRN Meds:.bisacodyl , calamine, cyclobenzaprine , hydrOXYzine, ipratropium-albuterol , ketorolac , prochlorperazine    I have personally reviewed following labs and imaging studies  LABORATORY DATA: CBC: Recent Labs  Lab 04/13/24 0318 04/14/24 0244 04/16/24 0419 04/17/24 0507 04/18/24 0415 04/19/24 0347  WBC 13.6* 11.6* 8.4 6.4 7.0 6.4  NEUTROABS 10.9*  --   --  3.4  3.3 3.3  HGB 13.0 13.9 12.1* 12.0* 12.3* 12.2*  HCT 36.8* 40.4 35.6* 34.6* 36.0* 35.4*  MCV 94.1 94.8 96.2 95.1 95.0 95.4  PLT 243 236 223 231 261 272    Basic Metabolic Panel: Recent Labs  Lab 04/13/24 1600 04/14/24 0244 04/15/24 0326 04/16/24 0419 04/17/24 0507 04/18/24 0415 04/19/24 0347  NA  --  138 136 139 140 138 137  K  --  4.3 3.7 3.9 3.5 3.7 3.7  CL  --  102 103 103 102 101 105   CO2  --  26 24 24 26 23 24   GLUCOSE  --  123* 117* 111* 89 91 100*  BUN  --  18 27* 30* 28* 17 17  CREATININE  --  1.36* 1.15 1.19 1.09 1.11 1.04  CALCIUM   --  8.9 8.3* 8.5* 8.5* 8.2* 8.4*  MG 1.6* 2.0  --   --  1.8 1.7 1.7    GFR: Estimated Creatinine Clearance: 55.6 mL/min (by C-G formula based on SCr of 1.04 mg/dL).  Liver Function Tests: Recent Labs  Lab 04/14/24 0244  AST 32  ALT 37  ALKPHOS 28*  BILITOT 1.9*  PROT 6.8  ALBUMIN 3.5   No results for input(s): LIPASE, AMYLASE in the last 168 hours. No results for input(s): AMMONIA in the last 168 hours.  Coagulation Profile: No results for input(s): INR, PROTIME in the last 168 hours.  Cardiac Enzymes: No results for input(s): CKTOTAL, CKMB, CKMBINDEX, TROPONINI in the last 168 hours.  BNP (last 3 results) No results for input(s): PROBNP in the last 8760 hours.  Lipid Profile: No results for input(s): CHOL, HDL, LDLCALC, TRIG, CHOLHDL, LDLDIRECT in the last 72 hours.  Thyroid  Function Tests: No results for input(s): TSH, T4TOTAL, FREET4, T3FREE, THYROIDAB in the last 72 hours.  Anemia Panel: No results for input(s): VITAMINB12, FOLATE, FERRITIN, TIBC, IRON, RETICCTPCT in the last 72 hours.  Urine analysis:    Component Value Date/Time   COLORURINE YELLOW 04/13/2024 0554   APPEARANCEUR CLEAR 04/13/2024 0554   LABSPEC 1.018 04/13/2024 0554   PHURINE 5.0 04/13/2024 0554   GLUCOSEU NEGATIVE 04/13/2024 0554   HGBUR NEGATIVE 04/13/2024 0554   BILIRUBINUR NEGATIVE 04/13/2024 0554   KETONESUR 5 (A) 04/13/2024 0554   PROTEINUR NEGATIVE 04/13/2024 0554   UROBILINOGEN 0.2 04/29/2009 1117   NITRITE NEGATIVE 04/13/2024 0554   LEUKOCYTESUR NEGATIVE 04/13/2024 0554    Sepsis Labs: Lactic Acid, Venous    Component Value Date/Time   LATICACIDVEN 0.8 04/15/2024 0542    MICROBIOLOGY: No results found for this or any previous visit (from the past 240  hours).  RADIOLOGY STUDIES/RESULTS: No results found.    LOS: 6 days   Lavada Stank, MD  [facility]    To contact the attending provider between 7A-7P or the covering provider during after hours 7P-7A, please log into the web site [email or web address] and access using universal [place] password for that web site. If you do not have the password, please call the hospital operator.  04/19/2024, 9:09 AM

## 2024-04-19 NOTE — Progress Notes (Signed)
 Physical Therapy Treatment Patient Details Name: Derrick White MRN: [id number] DOB: [birth year] Today's Date: 04/19/2024   History of Present Illness 83 yo M adm 04/13/24 after fall with L2 compression fx, aspiration PNA.  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 Pt required assist for brace donning, education for back precautions and transfers. Pt able to progress gait distance this date but lacks awareness of fatigue with progressive decline in function and increased assist for balance with fatigue and chair pulled to pt. Will continue to follow. Patient will benefit from continued inpatient follow up therapy, <3 hours/day     If plan is discharge home, recommend the following: A little help with walking and/or transfers;Assistance with cooking/housework;Assist for transportation;Help with stairs or ramp for entrance;A lot of help with bathing/dressing/bathroom   Can travel by private vehicle     Yes  Equipment Recommendations  Rolling walker (2 wheels);Wheelchair (measurements PT);Wheelchair cushion (measurements PT);BSC/3in1    Recommendations for Other Services       Precautions / Restrictions Precautions Precautions: Fall;Back Precaution Booklet Issued: No Recall of Precautions/Restrictions: Impaired Precaution/Restrictions Comments: pt unable to state any back precautions, reviewed all as well as how to don/doff brace Required Braces or Orthoses: Spinal Brace Spinal Brace: Thoracolumbosacral orthotic;Applied in sitting position     Mobility  Bed Mobility Overal bed mobility: Needs Assistance Bed Mobility: Rolling, Sidelying to Sit Rolling: Supervision Sidelying to sit: Contact guard assist, HOB elevated       General bed mobility comments: tactile and verbal cues for sequence to roll and rise from side to  sitting    Transfers Overall transfer level: Needs assistance   Transfers: Sit to/from Stand Sit to Stand: Min assist, Mod assist   Step pivot transfers: Min assist, From elevated surface       General transfer comment: min assist to rise from bed, mod assist to rise from chair without armrests x 2, min assist to rise from [facility], cues for all transfers and hand placement. step pivot BSC to recliner with RW and min assist    Ambulation/Gait Ambulation/Gait assistance: Contact guard assist, Mod assist Gait Distance (Feet): 60 Feet Assistive device: Rolling walker (2 wheels) Gait Pattern/deviations: Shuffle, Step-through pattern, Trunk flexed   Gait velocity interpretation: <1.8 ft/sec, indicate of risk for recurrent falls   General Gait Details: pt initialy with good posture and RW cues, cues for safety and direction. Pt walked 28' with progressive fatigue and flexion requiring chair pulled to him with progressive decline from cGA to min assist for gait. Additional 10' from chair to BSc with mod assist for safety and balance with continued chair follow.   Stairs             Wheelchair Mobility     Tilt Bed    Modified Rankin (Stroke Patients Only)       Balance Overall balance assessment: Needs assistance Sitting-balance support: Feet supported, No upper extremity supported Sitting balance-Leahy Scale: Fair     Standing balance support: Bilateral upper extremity supported, During functional activity, Reliant on assistive device for balance Standing balance-Leahy Scale: Poor Standing balance comment: progressive need for assist in standing with RW support                            Communication Communication Communication: Impaired Factors Affecting  Communication: Reduced clarity of speech  Cognition Arousal: Alert Behavior During Therapy: WFL for tasks assessed/performed   PT - Cognitive impairments: History of cognitive impairments, Problem  solving, Memory, Attention, Safety/Judgement                       PT - Cognition Comments: decreased awareness of fatigue and activity regulation, decreased recall of precautions Following commands: Intact      Cueing Cueing Techniques: Verbal cues, Gestural cues  Exercises      General Comments        Pertinent Vitals/Pain Pain Assessment Pain Assessment: No/denies pain    Home Living                          Prior Function            PT Goals (current goals can now be found in the care plan section) Progress towards PT goals: Progressing toward goals    Frequency    Min 2X/week      PT Plan      Co-evaluation              AM-PAC PT 6 Clicks Mobility   Outcome Measure  Help needed turning from your back to your side while in a flat bed without using bedrails?: A Little Help needed moving from lying on your back to sitting on the side of a flat bed without using bedrails?: A Little Help needed moving to and from a bed to a chair (including a wheelchair)?: A Little Help needed standing up from a chair using your arms (e.g., wheelchair or bedside chair)?: A Lot Help needed to walk in hospital room?: A Lot Help needed climbing 3-5 steps with a railing? : Total 6 Click Score: 14    End of Session Equipment Utilized During Treatment: Back brace Activity Tolerance: Patient tolerated treatment well;Patient limited by fatigue Patient left: in chair;with call bell/phone within reach;with chair alarm set Nurse Communication: Mobility status;Precautions PT Visit Diagnosis: Unsteadiness on feet (R26.81);Other abnormalities of gait and mobility (R26.89);History of falling (Z91.81);Muscle weakness (generalized) (M62.81)     Time: 9074-8998 PT Time Calculation (min) (ACUTE ONLY): 36 min  Charges:    $Gait Training: 8-22 mins $Therapeutic Activity: 8-22 mins PT General Charges $$ ACUTE PT VISIT: 1 Visit                     Lenoard SQUIBB,  PT Acute Rehabilitation Services Office: [phone]    Lenoard NOVAK Cadell Gabrielson 04/19/2024, 11:31 AM

## 2024-04-19 NOTE — Plan of Care (Signed)

## 2024-04-20 DIAGNOSIS — J9601 Acute respiratory failure with hypoxia: Secondary | ICD-10-CM | POA: Diagnosis not present

## 2024-04-20 DIAGNOSIS — Z515 Encounter for palliative care: Secondary | ICD-10-CM

## 2024-04-20 DIAGNOSIS — Z7189 Other specified counseling: Secondary | ICD-10-CM

## 2024-04-20 MED ORDER — POTASSIUM CHLORIDE CRYS ER 20 MEQ PO TBCR
40.0000 meq | EXTENDED_RELEASE_TABLET | Freq: Once | ORAL | Status: AC
  Administered 2024-04-20: 40 meq via ORAL
  Filled 2024-04-20: qty 2

## 2024-04-20 MED ORDER — FUROSEMIDE 10 MG/ML IJ SOLN
20.0000 mg | Freq: Once | INTRAMUSCULAR | Status: AC
  Administered 2024-04-20: 20 mg via INTRAVENOUS
  Filled 2024-04-20: qty 2

## 2024-04-20 NOTE — Consult Note (Signed)
 Palliative Medicine Inpatient Consult Note  Consulting Provider: Dennise Lavada POUR, MD   Reason for consult:   Palliative Care Consult Services Palliative Medicine Consult  Reason for Consult? Patient with ataxia multiple falls, mild dementia, DNR likely discharge tomorrow to SNF family wants to talk to palliative care for long-term goals of care prior to discharge.  Thank you   04/20/2024  HPI:  Per intake H&P -->  Patient is a 83 y.o.  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   Palliative care involved to support additional goals of care conversations.   Clinical Assessment/Goals of Care:  *Please note that this is a verbal dictation therefore any spelling or grammatical errors are due to the Dragon Medical One system interpretation.  I have reviewed medical records including EPIC notes, labs and imaging, received report from bedside RN, assessed the patient who is lying in bed alert and oriented.    I met with Seymore and his wife, Devere to further discuss diagnosis prognosis, GOC, EOL wishes, disposition and options.   I introduced Palliative Medicine as specialized medical care for people living with serious illness. It focuses on providing relief from the symptoms and stress of a serious illness. The goal is to improve quality of life for both the patient and the family.  Medical History Review and Understanding:  A review of  Joaquin's past medical history significant for spinocerebellar ataxia diagnosed in 1996, previous stroke, hypertension, MCI, hiatal hernia, and  paroxysmal atrial fibrillation was completed.  Social History:  Ashar is from [place], [place] .  He and his wife have been married for the past 64 years.  They share 2 children and 5 grandchildren.  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  He shares that he loved his job and got great pleasure out of it. Raylan used to enjoy golfing and is healthier days.  He does have faith.  Enjoys spending time with his family and shares great pleasure in having his grandchildren.  Functional and Nutritional State:  Preceding hospitalization Dencil was living at home with his wife.  He states he had roughly 3 falls in the last year.  He uses a cane with short distances, rollator with long distances, and also has a scooter if needed.  He has today been able to bathe himself, dress himself, and feed himself.  He shares that he has lost weight in the last year accounting for roughly a 16 pound weight loss.  Advance Directives:  A detailed discussion was had today regarding advanced directives.  Ronnie does have advanced directives stating the desire for a natural death.  His surrogate decision maker is his wife, Devere.  Code Status:  Concepts specific to code status, artifical feeding and hydration, continued IV antibiotics and rehospitalization was had.  The difference between a aggressive medical intervention path  and a palliative comfort care path for this patient at this time was had.   I completed a MOST form today. The patient and family outlined their wishes for the following treatment decisions:  Cardiopulmonary Resuscitation: Do Not Attempt Resuscitation (DNR/No CPR)  Medical Interventions: Limited Additional Interventions: Use medical treatment, IV fluids and cardiac monitoring as indicated, DO NOT USE intubation  or mechanical ventilation. May consider use of less invasive airway support such as BiPAP or CPAP. Also provide comfort measures. Transfer to the hospital if indicated. Avoid intensive care.    Antibiotics: Determine use of limitation of antibiotics when infection occurs  IV Fluids: IV fluids for a defined trial period  Feeding Tube: Feeding tube for a defined trial period   Discussion:  A review of recent progress notes was completed in the company of Tandre and his wife.  We discussed the reason surrounding hospitalization inclusive of unfortunately his wife falling getting a concussion and then their daughter going to bring her home and finding Cy on the floor would also fallen and sustained a lumbar 2 fracture.  We reviewed while in the ER he got medications which promoted nausea and vomiting likely leading to an aspirational event.    We discussed the hypoxemic respiratory failure patient experienced and how over the last week he has slowly improved to the point where he can eat and drink.  We did discuss the global concern of ongoing aspirational risk and what that would mean - also specified what goals would be moving forward.  We reviewed Eean's physical decline over the past few months as well as his nutritional decline.  We discussed the concerns for this in the larger trajectory of his life.  At this point in time patient shares understanding of his acute on chronic disease burden.  We discussed if he were to worsen and neglect to improve the idea of transitioning to hospice care.  In the meanwhile patient and his wife are agreeable to outpatient palliative support.  Crit shares that his goals are to see his granddaughter graduate from physician assistant school which is on December 12 and also to enjoy a nice holiday with his family.  He is hopeful to transition to [facility] rehabilitation and regain his strength relatively quickly so that he may go back home.  Discussed the importance of continued conversation with family and their  medical providers regarding overall plan of care and treatment options, ensuring decisions are within the context of the patients values and  GOCs.  Decision Maker: MARJO PULLING (Spouse): [phone] (Mobile)   SUMMARY OF RECOMMENDATIONS   DNAR/DNI  MOST/DNR Form Completed, paper copy placed onto the chart electric copy can be found in [employer]  Open and honest conversations held in the setting of patient's acute on chronic disease burden  Goals at this time are to see granddaughters graduation from physician assistant school and to enjoy the holidays with family  Patient and his wife are open outpatient palliative support  The palliative medicine team will remain available during hospitalization however the plan is for patient to transition to [facility] skilled nursing facility as early as tomorrow  Code Status/Advance Care Planning: DNAR/DNI  Palliative Prophylaxis:  Aspiration, Bowel Regimen, Delirium Protocol, Frequent Pain Assessment, Oral Care, Palliative Wound Care, and Turn Reposition  Additional Recommendations (Limitations, Scope, Preferences): Continue present measures  Psycho-social/Spiritual:  Desire for further Chaplaincy support: Not at this time Additional Recommendations: Education on disease burden   Prognosis: Limited overall  Discharge Planning: Discharge to [facility] once medically optimized.  Vitals:   04/20/24 0758 04/20/24 1155  BP: (!) 112/53 119/69  Pulse: 70 63  Resp: 11 (!) 21  Temp: 98 F (36.7 C) 98 F (36.7 C)  SpO2: 93%     Intake/Output Summary (Last 24 hours) at 04/20/2024 1435 Last data filed at 04/20/2024 1048 Gross per 24 hour  Intake 423.17 ml  Output 400 ml  Net 23.17 ml   Last Weight  Most recent update: 04/13/2024  3:22 AM    Weight  74.8 kg (165 lb)            LABS: CBC:    Component Value Date/Time   WBC 6.4 04/19/2024 0347   HGB 12.2 (L) 04/19/2024 0347   HCT 35.4 (L) 04/19/2024 0347   PLT 272 04/19/2024 0347   MCV 95.4 04/19/2024 0347   NEUTROABS 3.3 04/19/2024 0347   LYMPHSABS 1.7 04/19/2024 0347   MONOABS 1.2 (H) 04/19/2024 0347   EOSABS  0.2 04/19/2024 0347   BASOSABS 0.0 04/19/2024 0347   Comprehensive Metabolic Panel:    Component Value Date/Time   NA 137 04/19/2024 0347   NA 136 02/28/2023 1118   K 3.7 04/19/2024 0347   CL 105 04/19/2024 0347   CO2 24 04/19/2024 0347   BUN 17 04/19/2024 0347   BUN 19 02/28/2023 1118   CREATININE 1.04 04/19/2024 0347   GLUCOSE 100 (H) 04/19/2024 0347   CALCIUM  8.4 (L) 04/19/2024 0347   AST 32 04/14/2024 0244   ALT 37 04/14/2024 0244   ALKPHOS 28 (L) 04/14/2024 0244   BILITOT 1.9 (H) 04/14/2024 0244   BILITOT 0.5 10/01/2022 1037   PROT 6.8 04/14/2024 0244   PROT 7.3 10/01/2022 1037   ALBUMIN 3.5 04/14/2024 0244   ALBUMIN 4.4 10/01/2022 1037   Gen: Elderly Caucasian male chronically ill in appearance HEENT: moist mucous membranes CV: Regular rate and rhythm  PULM: On 2LPM [place], breathing is even and nonlabored  ABD: soft/nontender  EXT: No edema  Neuro: Alert and oriented x3  PPS: 50%   This conversation/these recommendations were discussed with patient primary care team, Dr. Dennise ______________________________________________________ Rosaline Becton [facility] Palliative Medicine Team Team Cell Phone: [phone] Please utilize secure chat with additional questions, if there is no response within 30 minutes please call the above phone number  Total Time: 75 Billing based on MDM: High  Palliative Medicine Team providers are available by phone from 7am to 7pm daily and can be reached through the team cell phone.  Should this patient require assistance outside of these hours, please call the patient's attending physician.

## 2024-04-20 NOTE — TOC Progression Note (Signed)
 Transition of Care [facility]) - Progression Note    Patient Details  Name: Derrick White MRN: [id number] Date of Birth: 08-29-1940  Transition of Care [facility]) CM/SW Contact  Bernardino Dean, [place] Phone Number: 04/20/2024, 9:43 AM  Clinical Narrative:    Spoke with patient's spouse Devere, provided updates. She is aware of and agreeable to the prospective discharge to [facility] tomorrow.    Expected Discharge Plan: Skilled Nursing Facility Barriers to Discharge: Continued Medical Work up               Expected Discharge Plan and Services In-house Referral: Clinical Social Work   Post Acute Care Choice: Skilled Nursing Facility                                         Social Drivers of Health (SDOH) Interventions SDOH Screenings   Food Insecurity: No Food Insecurity (04/19/2024)  Housing: Low Risk  (04/19/2024)  Transportation Needs: No Transportation Needs (04/19/2024)  Utilities: Not At Risk (04/19/2024)  Social Connections: Socially Integrated (04/19/2024)  Tobacco Use: Medium Risk (03/24/2024)    Readmission Risk Interventions     No data to display

## 2024-04-20 NOTE — Plan of Care (Signed)

## 2024-04-20 NOTE — TOC Progression Note (Addendum)
 Transition of Care [facility]) - Progression Note    Patient Details  Name: Derrick White MRN: [id number] Date of Birth: [birth year]  Transition of Care [facility]) CM/SW Contact  Inocente GORMAN Kindle, LCSW Phone Number: 04/20/2024, 9:31 AM  Clinical Narrative:    9:31am-CSW received approval from [facility] for [facility], Auth #[id number] effective for 7 days. CSW updated [facility] that discharge may happen tomorrow per MD.   [facility] authorization is still under review.   12:28 PM-CSW received insurance approval for [facility] [place] # [id number].  Patient in agreement with outpatient palliative care. CSW sent referral to [facility] per [facility] to follow while at [facility] for rehab.   Expected Discharge Plan: Skilled Nursing Facility Barriers to Discharge: Continued Medical Work up               Expected Discharge Plan and Services In-house Referral: Clinical Social Work   Post Acute Care Choice: Skilled Nursing Facility                                         Social Drivers of Health (SDOH) Interventions SDOH Screenings   Food Insecurity: No Food Insecurity (04/19/2024)  Housing: Low Risk  (04/19/2024)  Transportation Needs: No Transportation Needs (04/19/2024)  Utilities: Not At Risk (04/19/2024)  Social Connections: Socially Integrated (04/19/2024)  Tobacco Use: Medium Risk (03/24/2024)    Readmission Risk Interventions     No data to display

## 2024-04-20 NOTE — Progress Notes (Signed)
 PROGRESS NOTE        PATIENT DETAILS Name: Derrick White Age: 83 y.o. Sex: male Date of Birth: 10-07-1940 Admit Date: 04/13/2024 Admitting Physician [name], MD ERE:[email or web address], [name], MD  Brief Summary: Patient is a 83 y.o.  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  Significant events: 11/25>> admit to [facility].  Significant studies: 11/25>> x-ray lumbar spine: L2 compression fracture. 11/25>> x-ray thoracic spine: No acute abnormality. 11/25>> CT head: No acute intracranial abnormality. 11/25>> CT C-spine: No fracture, anterior fusion C3-C6. 11/25>> CXR: Vascular congestion. 11/25>> CTA chest: No PE, mild emphysema. 11/25>> x-ray abdomen: No acute findings, moderate colonic stool burden. 11/26>> CT lumbar spine: Moderate acute appearing compression fracture involving L2. 11/29>> CXR: Subtle airspace disease-left mid/left lower lung fields.  Significant microbiology data: None  Procedures: None  Consults: None  Subjective: Patient in bed, appears comfortable, denies any headache, no fever, no chest pain or pressure, no shortness of breath , no abdominal pain. No focal weakness.  His rash and itching much improved.   Objective: Vitals: Blood pressure (!) 112/53, pulse 70, temperature 98 F (36.7 C), temperature source Axillary, resp. rate 11, height 5' 10 (1.778 m), weight 74.8 kg, SpO2 93%.   Exam:  Awake Alert, No new F.N deficits, Normal affect [place].AT,PERRAL Supple Neck, No JVD,   Symmetrical Chest wall movement, Good  air movement bilaterally, CTAB RRR,No Gallops, Rubs or new Murmurs,  +ve B.Sounds, Abd Soft, No tenderness,   Morbilliform itchy rash in the back and upper torso improving   Assessment/Plan: Acute hypoxic respiratory failure Suspicion for aspiration pneumonia (vomited repeatedly in the ED)-although no frank infiltrates seen on initial imaging studies-however repeat chest x-ray on 11/29 does show infiltrate. FiO2 requirements have been stable-2-3 L this morning-however at night he requires a bit more oxygen (OSA) Was on Unasyn  but switched to Rocephin and Flagyl on 04/17/2024 due to morbilliform rash caused by Unasyn  Hold diuretics today-his volume status stable-received Lasix  on 11/28 Appreciate SLP evaluation-seems to be tolerating regular diet. May require short-term oxygen on discharge to SNF.  Morbilliform itchy rash in the upper back developed 04/17/2024 likely Unasyn  allergy.  Antibiotic switched as above, supportive care with steroid cream, received Benadryl and IV H2 blocker on 04/17/2024.  Significantly improved on 04/19/2024.   Presumed aspiration pneumonia See above  Acute L2 compression fracture Secondary to mechanical fall Pain seems to be currently controlled-continue scheduled Tylenol /Lidoderm  patch As needed Flexeril /Toradol  Avoid narcotics as much as possible given issues with sedation/vomiting resulting in aspiration Use TSLO brace as much as possible Outpatient follow-up with neurosurgery.  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  AKI Mild Probably secondary to recent nausea/vomiting Resolved with supportive care.  Paroxysmal atrial fibrillation-s/p watchman's device 2022. Maintaining sinus rhythm Received epic text message from Dr. Vina Gull (patient's primary cardiologist)-on 11/26-recommendations are to stop amiodarone  (given respiratory failure) and  check ESR which was stable. Not on anticoagulation-as has watchman's device.  History of CVA 2021 Supportive  care No anticoagulation as has watchman's device.  HTN BP was soft-no longer on amlodipine  Continue midodrine  for BP support.  HLD Lipitor  Constipation Stool burden seen on CT imaging Continue MiraLAX /senna Dulcolax suppository x 1 today.  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  Appreciate SLP/PT/OT input-tolerating regular diet-SNF planned on discharge.  Palliative care Spoke with daughter [name] over the phone on 11/26-significant decline in function over the past several months-barely ambulatory-issues with dysphagia-poor oral intake.  Per [name]-patient never wanted any heroic measures.  Agreeable for DNR.  Subsequently confirmed with patient at bedside as well. Will need outpatient palliative care follow-up-currently stable-and does not appear to be deteriorating.  Code status:   Code Status: Limited: Do not attempt resuscitation (DNR) -DNR-LIMITED -Do Not Intubate/DNI    DVT Prophylaxis: enoxaparin  (LOVENOX ) injection 40 mg Start: 04/13/24 2215   Family Communication: Daughter -[name]-[phone] updated 11/26   Disposition Plan: Status is: Inpatient Remains inpatient appropriate because: Severity of illness   Planned Discharge Destination:Home health versus SNF   Diet: Diet Order             Diet regular Room service appropriate? No; Fluid consistency: Thin  Diet effective now                    MEDICATIONS: Scheduled Meds:  acetaminophen   1,000 mg Oral Q8H   aspirin  EC  81 mg Oral Daily   atorvastatin   40 mg Oral Daily   Chlorhexidine  Gluconate Cloth  6 each Topical Daily   clobetasol cream   Topical TID   enoxaparin  (LOVENOX ) injection   40 mg Subcutaneous Q24H   famotidine  20 mg Oral Daily   feeding supplement  237 mL Oral BID BM   lidocaine   2 patch Transdermal Q24H   midodrine   7.5 mg Oral TID WC   polyethylene glycol  17 g Oral BID   senna-docusate  2 tablet Oral QHS   sodium chloride  flush  3 mL Intravenous Q12H   tamsulosin   0.4 mg Oral Daily   Continuous Infusions:  cefTRIAXone (ROCEPHIN)  IV Stopped (04/20/24 0111)   metronidazole 500 mg (04/20/24 0222)   PRN Meds:.bisacodyl , calamine, cyclobenzaprine , hydrOXYzine, ipratropium-albuterol , prochlorperazine    I have personally reviewed following labs and imaging studies  LABORATORY DATA: CBC: Recent Labs  Lab 04/14/24 0244 04/16/24 0419 04/17/24 0507 04/18/24 0415 04/19/24 0347  WBC 11.6* 8.4 6.4 7.0 6.4  NEUTROABS  --   --  3.4 3.3 3.3  HGB 13.9 12.1* 12.0* 12.3* 12.2*  HCT 40.4 35.6* 34.6* 36.0* 35.4*  MCV 94.8 96.2 95.1 95.0 95.4  PLT 236 223 231 261 272    Basic Metabolic Panel: Recent Labs  Lab 04/13/24 1600 04/14/24 0244 04/14/24 0244 04/15/24 0326 04/16/24 0419 04/17/24 0507 04/18/24 0415 04/19/24 0347  NA  --  138   < > 136 139 140 138 137  K  --  4.3   < > 3.7 3.9 3.5 3.7 3.7  CL  --  102   < > 103 103 102 101 105  CO2  --  26   < > 24 24 26 23 24   GLUCOSE  --  123*   < > 117* 111* 89 91 100*  BUN  --  18   < > 27* 30* 28* 17 17  CREATININE  --  1.36*   < >  1.15 1.19 1.09 1.11 1.04  CALCIUM   --  8.9   < > 8.3* 8.5* 8.5* 8.2* 8.4*  MG 1.6* 2.0  --   --   --  1.8 1.7 1.7   < > = values in this interval not displayed.    GFR: Estimated Creatinine Clearance: 55.6 mL/min (by C-G formula based on SCr of 1.04 mg/dL).  Liver Function Tests: Recent Labs  Lab 04/14/24 0244  AST 32  ALT 37  ALKPHOS 28*  BILITOT 1.9*  PROT 6.8  ALBUMIN 3.5   No results for input(s): LIPASE, AMYLASE in the last 168 hours. No results for input(s): AMMONIA in the last 168 hours.  Coagulation Profile: No results for input(s): INR,  PROTIME in the last 168 hours.  Cardiac Enzymes: No results for input(s): CKTOTAL, CKMB, CKMBINDEX, TROPONINI in the last 168 hours.  BNP (last 3 results) No results for input(s): PROBNP in the last 8760 hours.  Lipid Profile: No results for input(s): CHOL, HDL, LDLCALC, TRIG, CHOLHDL, LDLDIRECT in the last 72 hours.  Thyroid  Function Tests: No results for input(s): TSH, T4TOTAL, FREET4, T3FREE, THYROIDAB in the last 72 hours.  Anemia Panel: No results for input(s): VITAMINB12, FOLATE, FERRITIN, TIBC, IRON, RETICCTPCT in the last 72 hours.  Urine analysis:    Component Value Date/Time   COLORURINE YELLOW 04/13/2024 0554   APPEARANCEUR CLEAR 04/13/2024 0554   LABSPEC 1.018 04/13/2024 0554   PHURINE 5.0 04/13/2024 0554   GLUCOSEU NEGATIVE 04/13/2024 0554   HGBUR NEGATIVE 04/13/2024 0554   BILIRUBINUR NEGATIVE 04/13/2024 0554   KETONESUR 5 (A) 04/13/2024 0554   PROTEINUR NEGATIVE 04/13/2024 0554   UROBILINOGEN 0.2 04/29/2009 1117   NITRITE NEGATIVE 04/13/2024 0554   LEUKOCYTESUR NEGATIVE 04/13/2024 0554    Sepsis Labs: Lactic Acid, Venous    Component Value Date/Time   LATICACIDVEN 0.8 04/15/2024 0542    MICROBIOLOGY: No results found for this or any previous visit (from the past 240 hours).  RADIOLOGY STUDIES/RESULTS: No results found.    LOS: 7 days   Lavada Stank, MD  [facility]    To contact the attending provider between 7A-7P or the covering provider during after hours 7P-7A, please log into the web site [email or web address] and access using universal [place] password for that web site. If you do not have the password, please call the hospital operator.  04/20/2024, 9:52 AM

## 2024-04-21 DIAGNOSIS — Z7189 Other specified counseling: Secondary | ICD-10-CM | POA: Diagnosis not present

## 2024-04-21 DIAGNOSIS — J9601 Acute respiratory failure with hypoxia: Secondary | ICD-10-CM | POA: Diagnosis not present

## 2024-04-21 DIAGNOSIS — Z515 Encounter for palliative care: Secondary | ICD-10-CM | POA: Diagnosis not present

## 2024-04-21 MED ORDER — CLOBETASOL PROPIONATE 0.05 % EX CREA: TOPICAL_CREAM | Freq: Two times a day (BID) | CUTANEOUS | 0 refills | Status: AC

## 2024-04-21 MED ORDER — TAMSULOSIN HCL 0.4 MG PO CAPS: 0.4000 mg | ORAL_CAPSULE | Freq: Every day | ORAL | Status: AC

## 2024-04-21 MED ORDER — MIDODRINE HCL 2.5 MG PO TABS: 7.5000 mg | ORAL_TABLET | Freq: Three times a day (TID) | ORAL | Status: AC

## 2024-04-21 MED ORDER — CLOBETASOL PROPIONATE 0.05 % EX CREA
TOPICAL_CREAM | Freq: Three times a day (TID) | CUTANEOUS | Status: DC
  Filled 2024-04-21: qty 15

## 2024-04-21 NOTE — Progress Notes (Signed)
 Physical Therapy Treatment Patient Details Name: Derrick White MRN: [id number] DOB: 1941-04-11 Today's Date: 04/21/2024   History of Present Illness 83 yo M adm 04/13/24 after fall with L2 compression fx, aspiration PNA.  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 Pt able to recall 2/3 back precautions and participates in donning brace with max A. Pt demonstrates ataxia throughout session, but fair stability with RW support in standing. Pt able to increase gait distance on RA with SpO2 >88% when pleth reliable. Pt requires cues for monitoring fatigue and for PLB during ambulation. After seated rest, pt able to tolerate additional stand and standing marches with focus on increased B foot clearance. Pt continues to benefit from PT services to progress toward functional mobility goals.    If plan is discharge home, recommend the following: A little help with walking and/or transfers;Assistance with cooking/housework;Assist for transportation;Help with stairs or ramp for entrance;A lot of help with bathing/dressing/bathroom   Can travel by private vehicle     Yes  Equipment Recommendations  Rolling walker (2 wheels);Wheelchair (measurements PT);Wheelchair cushion (measurements PT);BSC/3in1    Recommendations for Other Services       Precautions / Restrictions Precautions Precautions: Fall;Back Precaution Booklet Issued: No Recall of Precautions/Restrictions: Impaired Required Braces or Orthoses: Spinal Brace Spinal Brace: Thoracolumbosacral orthotic;Applied in sitting position Restrictions Weight Bearing Restrictions Per Provider Order: No     Mobility  Bed Mobility               General bed mobility comments: Pt sitting EOB upon entry    Transfers Overall transfer level: Needs assistance Equipment used:  Rolling walker (2 wheels) Transfers: Sit to/from Stand Sit to Stand: Min assist, Contact guard assist           General transfer comment: From EOB with min A progressing to CGA from recliner with cues for hand placement    Ambulation/Gait Ambulation/Gait assistance: Contact guard assist Gait Distance (Feet): 81 Feet Assistive device: Rolling walker (2 wheels) Gait Pattern/deviations: Step-through pattern, Trunk flexed, Decreased stride length, Ataxic Gait velocity: decreased     General Gait Details: Pt demonstrates ataxic, short steps with low foot clearance. Cues for increased clearance and step length with some improvement. Cues for upright posture. Increased time for turns with slightly increased instability.   Stairs             Wheelchair Mobility     Tilt Bed    Modified Rankin (Stroke Patients Only)       Balance Overall balance assessment: Needs assistance Sitting-balance support: Feet supported, No upper extremity supported Sitting balance-Leahy Scale: Fair Sitting balance - Comments: EOB   Standing balance support: Reliant on assistive device for balance, Bilateral upper extremity supported, During functional activity Standing balance-Leahy Scale: Poor Standing balance comment: Reliant on RW support                            Communication Communication Communication: Impaired Factors Affecting Communication: Reduced clarity of speech  Cognition Arousal: Alert Behavior During Therapy: WFL for tasks assessed/performed   PT - Cognitive impairments: History of cognitive impairments, Problem solving, Memory, Attention, Safety/Judgement                       PT - Cognition Comments: Decreased short term  memory with pt asking the same question multiple times Following commands: Intact      Cueing Cueing Techniques: Verbal cues, Gestural cues  Exercises Other Exercises Other Exercises: static standing marches    General  Comments General comments (skin integrity, edema, etc.): Pt on RA upon entry with SpO2 89-92% with unreliable pleth most of the time. Pt able to ambulate on RA with SpO2 88-91% with cues for PLB throughout. SpO2 91-94% at rest on RA at end of session so pt left on RA and RN notified.      Pertinent Vitals/Pain Pain Assessment Pain Assessment: No/denies pain     PT Goals (current goals can now be found in the care plan section) Acute Rehab PT Goals Patient Stated Goal: to improve mobility and return home. PT Goal Formulation: With patient/family Time For Goal Achievement: 04/27/24 Progress towards PT goals: Progressing toward goals    Frequency    Min 2X/week       AM-PAC PT 6 Clicks Mobility   Outcome Measure  Help needed turning from your back to your side while in a flat bed without using bedrails?: A Little Help needed moving from lying on your back to sitting on the side of a flat bed without using bedrails?: A Little Help needed moving to and from a bed to a chair (including a wheelchair)?: A Little Help needed standing up from a chair using your arms (e.g., wheelchair or bedside chair)?: A Little Help needed to walk in hospital room?: A Little Help needed climbing 3-5 steps with a railing? : Total 6 Click Score: 16    End of Session Equipment Utilized During Treatment: Back brace;Gait belt Activity Tolerance: Patient tolerated treatment well Patient left: in chair;with call bell/phone within reach;with chair alarm set Nurse Communication: Mobility status;Precautions PT Visit Diagnosis: Unsteadiness on feet (R26.81);Other abnormalities of gait and mobility (R26.89);History of falling (Z91.81);Muscle weakness (generalized) (M62.81)     Time: 9094-9060 PT Time Calculation (min) (ACUTE ONLY): 34 min  Charges:    $Gait Training: 8-22 mins $Therapeutic Activity: 8-22 mins PT General Charges $$ ACUTE PT VISIT: 1 Visit                    Darryle George, PTA Acute  Rehabilitation Services Secure Chat Preferred  [phone]    Darryle George 04/21/2024, 9:53 AM

## 2024-04-21 NOTE — Discharge Summary (Signed)
 Discharge summary note.  Derrick White FMW:[id number] DOB: February 19, 1941 DOA: 04/13/2024  PCP: [name], MD  Admit date: 04/13/2024  Discharge date: 04/21/2024  Admitted From: Home   Disposition:  SNF with Pall Care follow up   Recommendations for Outpatient Follow-up:   Follow up with PCP in 1-2 weeks  PCP Please obtain BMP/CBC, 2 view CXR in 1week,  (see Discharge instructions)   PCP Please follow up on the following pending results:     Home Health: None   Equipment/Devices: None  Consultations: Neuro Surg, Pall care Discharge Condition: Stable     CODE STATUS: DNR  Diet Recommendation: Heart Healthy     Chief Complaint  Patient presents with   Fall     Brief history of present illness from the day of admission and additional interim summary    83 y.o.  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   Significant events: 11/25>> admit to [facility].   Significant studies: 11/25>> x-ray lumbar spine: L2 compression fracture. 11/25>> x-ray thoracic spine: No acute abnormality. 11/25>> CT head: No acute intracranial abnormality. 11/25>> CT C-spine: No fracture, anterior fusion C3-C6. 11/25>> CXR: Vascular congestion. 11/25>> CTA chest: No PE, mild emphysema. 11/25>> x-ray abdomen: No acute findings, moderate colonic stool burden. 11/26>> CT lumbar spine: Moderate acute appearing  compression fracture involving L2. 11/29>> CXR: Subtle airspace disease-left mid/left lower lung fields                                                                 Hospital Course   Acute hypoxic respiratory failure Suspicion for aspiration pneumonia (vomited repeatedly in the ED)-although no frank infiltrates seen on initial imaging studies-however repeat chest x-ray on 11/29 does show infiltrate. Seen by speech therapy, tolerating regular diet, no signs of ongoing aspiration, finished his antibiotic treatment for aspiration pneumonia.  Clinically much improved, on 2 L nasal cannula oxygen which should be continued for now.  He might have undiagnosed sleep apnea as well, continue 2 L a day and nighttime for now.   Morbilliform itchy rash in the upper back developed 04/17/2024 likely Unasyn  allergy.  Antibiotic switched as above, supportive care with steroid cream, received Benadryl and IV H2 blocker on 04/17/2024.  Significantly improved on 04/19/2024.    Presumed aspiration pneumonia See above   Acute L2 compression fracture Secondary to mechanical fall Pain seems to be currently controlled-continue scheduled Tylenol /Lidoderm  patch As needed Flexeril /Toradol  Avoid narcotics as much as possible given issues with  sedation/vomiting resulting in aspiration Use TSLO brace as much as possible Outpatient follow-up with neurosurgery.   Acute urinary retention Probable underlying BPH Required Foley which was discontinued 2 days ago, no postvoid residuals continue Flomax    AKI Resolved after hydration   Paroxysmal atrial fibrillation-s/p watchman's device 2022. Maintaining sinus rhythm Received epic text message from Dr. Vina Gull (patient's primary cardiologist)-on 11/26-recommendations are to stop amiodarone  (given respiratory failure) and check ESR which was stable. Not on anticoagulation-as has watchman's device.   History of CVA 2021 Supportive care No anticoagulation as has  watchman's device.   HTN BP was soft-no longer on amlodipine  Continue midodrine  for BP support.   HLD Lipitor   Constipation Resolved after bowel regimen   History of spinocerebellar ataxia with chronic debility/dysarthria/dysphagia/frequent falls SNF discharge continue PT OT and fall precautions at SNF, make sure patient wears TLSO brace at all times when he is out of bed  # Wife request palliative care saw the patient again on 04/20/2024, they confirmed DNR, signed a MOST form and would like to focus more on comfort and quality of life going forward he will benefit from continued palliative care follow-up at SNF.   Discharge diagnosis     Principal Problem:   Acute respiratory failure with hypoxia (HCC) Active Problems:   Closed compression fracture of L2 lumbar vertebra, initial encounter (HCC)   Essential hypertension   Spinocerebellar ataxia (HCC)   Paroxysmal atrial fibrillation [facility])    Discharge instructions    Discharge Instructions     Discharge instructions   Complete by: As directed    Use TLSO brace at all times when mobile and standing.  Follow-up with neurosurgery in 1-2 weeks.  Follow with Primary MD Tisovec, [name], MD in 7 days   Get CBC, CMP, Magnesium , 2 view Chest X ray -  checked next visit with your primary MD or SNF MD   Activity: As tolerated with Full fall precautions use walker/cane & assistance as needed  Disposition SNF  Diet: Heart Healthy   Special Instructions: If you have smoked or chewed Tobacco  in the last 2 yrs please stop smoking, stop any regular Alcohol   and or any Recreational drug use.  On your next visit with your primary care physician please Get Medicines reviewed and adjusted.  Please request your Prim.MD to go over all Hospital Tests and Procedure/Radiological results at the follow up, please get all Hospital records sent to your Prim MD by signing hospital release before you go home.  If you experience worsening  of your admission symptoms, develop shortness of breath, life threatening emergency, suicidal or homicidal thoughts you must seek medical attention immediately by calling 911 or calling your MD immediately  if symptoms less severe.  You Must read complete instructions/literature along with all the possible adverse reactions/side effects for all the Medicines you take and that have been prescribed to you. Take any new Medicines after you have completely understood and accpet all the possible adverse reactions/side effects.   Do not drive when taking Pain medications.  Do not take more than prescribed Pain, Sleep and Anxiety Medications  Wear Seat belts while driving.       Discharge Medications   Allergies as of 04/21/2024       Reactions   Morphine Hives, Itching   Codeine Hives, Itching   Other reaction(s): bad feeling   Propoxyphene Itching   DARVOCET   Shrimp [shellfish Allergy] Rash        Medication  List     STOP taking these medications    amiodarone  200 MG tablet Commonly known as: PACERONE    amLODipine  2.5 MG tablet Commonly known as: NORVASC    diphenhydramine-acetaminophen  25-500 MG Tabs tablet Commonly known as: TYLENOL  PM   oxybutynin 10 MG 24 hr tablet Commonly known as: DITROPAN-XL       TAKE these medications    acetaminophen  500 MG tablet Commonly known as: TYLENOL  Take 500 mg by mouth every 6 (six) hours as needed for moderate pain or headache.   Align 4 MG Caps Take by mouth in the morning.   aspirin  EC 81 MG tablet Take 1 tablet (81 mg total) by mouth daily. Swallow whole.   atorvastatin  40 MG tablet Commonly known as: LIPITOR Take 1 tablet (40 mg total) by mouth daily.   Cholecalciferol 25 MCG (1000 UT) tablet Take 1,000 Units by mouth at bedtime.   clobetasol cream 0.05 % Commonly known as: TEMOVATE Apply topically 2 (two) times daily. Apply to the backApply to back and torso at the site of rash.  Avoid face.   fluticasone  50  MCG/ACT nasal spray Commonly known as: FLONASE  Place 1 spray into both nostrils daily as needed for allergies or rhinitis.   midodrine  2.5 MG tablet Commonly known as: PROAMATINE  Take 3 tablets (7.5 mg total) by mouth 3 (three) times daily with meals.   pantoprazole  40 MG tablet Commonly known as: PROTONIX  Take 40 mg by mouth in the morning.   Systane Balance 0.6 % Soln Generic drug: Propylene Glycol Place 1 drop into both eyes 2 (two) times daily as needed (burning).   tamsulosin  0.4 MG Caps capsule Commonly known as: FLOMAX  Take 1 capsule (0.4 mg total) by mouth daily.         Contact information for follow-up providers     [facility]. Schedule an appointment as soon as possible for a visit .   Specialty: Neurosurgery Contact information: [address] [place] [address] [phone]         [name], MD. Schedule an appointment as soon as possible for a visit in 1 week(s).   Specialty: Internal Medicine Contact information: [address] [place] [address] [phone]              Contact information for after-discharge care     Destination     [facility] .   Service: Skilled Nursing Contact information: [address] [place]  [address] [phone]                     Major procedures and Radiology Reports - PLEASE review detailed and final reports thoroughly  -       DG Chest [facility] 1 View Result Date: 04/17/2024 EXAM: 1 VIEW(S) XRAY OF THE CHEST 04/17/2024 06:03:46 AM COMPARISON: Portable chest yesterday at 06:10 AM. CLINICAL HISTORY: SOB (shortness of breath). FINDINGS: LUNGS AND PLEURA: A subtle airspace infiltrate is suspected developing in the left mid and lower lung fields concerning for pneumonia. The remaining lungs are clear. No pleural effusion is seen. No pneumothorax. HEART AND MEDIASTINUM: The cardiomediastinal silhouette is stable. There  is aortic atherosclerosis. BONES AND SOFT TISSUES: Thoracic spondylosis. No acute osseous abnormality. IMPRESSION: 1. Subtle airspace infiltrate suspected developing in the left mid and lower lung fields, concerning for pneumonia. 2. No pleural effusion. 3. Stable cardiomediastinal silhouette. Electronically signed by: Francis Quam MD 04/17/2024 06:14 AM EST RP Workstation: [id number]  DG Chest Port 1 View Result Date: 04/16/2024 EXAM: 1 VIEW(S) XRAY OF THE CHEST 04/16/2024 06:14:00 AM COMPARISON: Portable chest 04/14/2024. CLINICAL HISTORY: SOB (shortness of breath). FINDINGS: LUNGS AND PLEURA: Mildly elevated right hemidiaphragm. Linear atelectasis developed in the outer right upper lobe. No pleural effusion. No pneumothorax. No focal infiltrate is seen. HEART AND MEDIASTINUM: Calcification in the transverse aorta with a stable mediastinum. The cardiac size is normal. BONES AND SOFT TISSUES: No new osseous findings. Asymmetric shoulder degenerative joint disease noted. Multilevel partially visible cervical ACDF (anterior cervical discectomy and fusion) plating. IMPRESSION: 1. Linear atelectasis in the outer right upper lobe, without appreciable pneumonia. Electronically signed by: Francis Quam MD 04/16/2024 07:44 AM EST RP Workstation: [id number]   DG CHEST PORT 1 VIEW Result Date: 04/14/2024 CLINICAL DATA:  Hypoxia. EXAM: PORTABLE CHEST 1 VIEW COMPARISON:  04/13/2024 FINDINGS: Normal-sized heart. Aortic arch calcifications. Interval mild left basilar atelectasis. Otherwise, clear lungs with normal vascularity. Thoracic spine degenerative changes and cervical spine fixation hardware. IMPRESSION: Interval mild left basilar atelectasis. Electronically Signed   By: Elspeth Bathe M.D.   On: 04/14/2024 10:41   CT Lumbar Spine Wo Contrast Result Date: 04/14/2024 EXAM: CT OF THE LUMBAR SPINE WITHOUT CONTRAST 04/14/2024 12:12:57 AM TECHNIQUE: CT of the lumbar spine was performed without the administration of  intravenous contrast. Multiplanar reformatted images are provided for review. Automated exposure control, iterative reconstruction, and/or weight based adjustment of the mA/kV was utilized to reduce the radiation dose to as low as reasonably achievable. COMPARISON: Plain films today. CLINICAL HISTORY: Compression fracture, lumbar. FINDINGS: BONES AND ALIGNMENT: Moderate acute appearing compression fracture involving L2. Normal alignment. DEGENERATIVE CHANGES: Disc space narrowing and spurring at L5-S1. Moderate bilateral degenerative facet disease, most pronounced in the lower lumbar spine. SOFT TISSUES: No acute abnormality. IMPRESSION: 1. Moderate acute appearing compression fracture involving L2. 2. Degenerative changes as above. Electronically signed by: Franky Crease MD 04/14/2024 12:18 AM EST RP Workstation: [id number]   DG Abd 1 View Result Date: 04/13/2024 EXAM: 1 VIEW XRAY OF THE ABDOMEN 04/13/2024 10:07:00 PM COMPARISON: Comparison with 09/29/2019. CLINICAL HISTORY: Nausea and vomiting. FINDINGS: BOWEL: Gas and stool throughout the colon with moderate stool burden. No small or large bowel distention. SOFT TISSUES: No radiopaque stones. Aortoiliac calcifications. Residual contrast material in the bladder. BONES: No acute osseous abnormality. Degenerative changes in the spine and hips. LUNG BASES: Visualized lung bases appear clear. IMPRESSION: 1. No acute findings. 2. Moderate colonic stool burden without bowel obstruction. Electronically signed by: Elsie Gravely MD 04/13/2024 10:21 PM EST RP Workstation: [id number]   CT Angio Chest PE W and/or Wo Contrast Result Date: 04/13/2024 EXAM: CTA CHEST AORTA 04/13/2024 04:49:42 PM TECHNIQUE: CTA of the chest was performed without and with the administration of 75 mL of intravenous contrast (iohexol  (OMNIPAQUE ) 350 MG/ML injection 75 mL IOHEXOL  350 MG/ML SOLN). Multiplanar reformatted images are provided for review. MIP images are provided for review.  Automated exposure control, iterative reconstruction, and/or weight based adjustment of the mA/kV was utilized to reduce the radiation dose to as low as reasonably achievable. COMPARISON: Chest x-ray 04/13/2024. CLINICAL HISTORY: Pulmonary embolism (PE) suspected, high prob. FINDINGS: AORTA: Mild aortic atherosclerosis. No aneurysm. No thoracic aortic dissection. MEDIASTINUM: Left atrial appendage occluding device. Coronary vascular calcifications. Cardiomegaly. No significant pericardial effusion. No mediastinal lymphadenopathy. Mild fluid distention of the mid to distal esophagus. Small hiatal hernia is present. LYMPH NODES: No mediastinal, hilar or axillary lymphadenopathy. LUNGS AND PLEURA: Mild emphysema. Mild subpleural scarring within the right lung.  No focal consolidation or pulmonary edema. No pleural effusion or pneumothorax. UPPER ABDOMEN: Hyperdense-appearing liver parenchyma. Limited images of the upper abdomen are otherwise unremarkable. SOFT TISSUES AND BONES: No acute bone or soft tissue abnormality. IMPRESSION: 1. No evidence of pulmonary embolism. 2. Cardiomegaly. 3. Mild emphysema without acute airspace disease. 4. Hyperdense appearing liver parenchyma. This can be seen with iron overload or in association with certain medications (?amiodarone ) 5. Small hiatal hernia with mild diffuse fluid-filled mid to distal esophagus . Correlate for any history of reflux or esophagitis. Electronically signed by: Luke Bun MD 04/13/2024 05:16 PM EST RP Workstation: [id number]   DG Chest 2 View Result Date: 04/13/2024 CLINICAL DATA:  Increasing O2 requirement. EXAM: CHEST - 2 VIEW COMPARISON:  07/20/2020 FINDINGS: Low lung volumes. The cardio pericardial silhouette is enlarged. There is pulmonary vascular congestion without overt pulmonary edema. No acute bony abnormality. IMPRESSION: Low volume film with vascular congestion. Electronically Signed   By: Camellia Candle M.D.   On: 04/13/2024 05:40   CT  Cervical Spine Wo Contrast Result Date: 04/13/2024 EXAM: CT CERVICAL SPINE WITHOUT CONTRAST 04/13/2024 01:46:27 AM TECHNIQUE: CT of the cervical spine was performed without the administration of intravenous contrast. Multiplanar reformatted images are provided for review. Automated exposure control, iterative reconstruction, and/or weight based adjustment of the mA/kV was utilized to reduce the radiation dose to as low as reasonably achievable. COMPARISON: None available. CLINICAL HISTORY: Neck trauma (Age >= 65y) FINDINGS: CERVICAL SPINE: BONES AND ALIGNMENT: Straightening of the cervical spine. No subluxation. Facet alignment is normal. Anterior fusion hardware C3 through C6. No acute fracture or traumatic malalignment. DEGENERATIVE CHANGES: Moderate disc space narrowing at C2-C3 and advanced disc space narrowing C6-C7. Multilevel facet degenerative changes and foraminal narrowing. Multilevel, at least mild, canal stenosis. SOFT TISSUES: No prevertebral soft tissue swelling. Apical emphysema. Carotid vascular calcification. IMPRESSION: 1. No acute osseous abnormality of the cervical spine. 2. Anterior fusion hardware C3 through C6. 3. Moderate disc space narrowing at C2-C3 and advanced disc space narrowing C6-C7. 4. Apical pulmonary emphysema; Electronically signed by: Luke Bun MD 04/13/2024 01:56 AM EST RP Workstation: [id number]   CT Head Wo Contrast Result Date: 04/13/2024 EXAM: CT HEAD WITHOUT CONTRAST 04/13/2024 01:46:27 AM TECHNIQUE: CT of the head was performed without the administration of intravenous contrast. Automated exposure control, iterative reconstruction, and/or weight based adjustment of the mA/kV was utilized to reduce the radiation dose to as low as reasonably achievable. COMPARISON: MRI 11/05/2019, CT brain 11/05/2019. CLINICAL HISTORY: Head trauma, minor (Age >= 65y). FINDINGS: BRAIN AND VENTRICLES: No acute hemorrhage. No evidence of acute infarct. Moderate white matter hypodensity  consistent with chronic small vessel ischemic change. Moderate atrophy. Prominent ventricles but felt proportionate to the degree of atrophy present. No extra-axial collection. No mass effect or midline shift. ORBITS: No acute abnormality. SINUSES: No acute abnormality. SOFT TISSUES AND SKULL: No acute soft tissue abnormality. No skull fracture. Carotid vascular calcification. IMPRESSION: 1. No acute intracranial abnormality. 2. 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 Electronically signed by: Luke Bun MD 04/13/2024 01:53 AM EST RP Workstation: [id number]   DG Lumbar Spine Complete Result Date: 04/13/2024 EXAM: 4 VIEW(S) XRAY OF THE LUMBAR SPINE 04/13/2024 01:39:05 AM COMPARISON: None available. CLINICAL HISTORY: Pain, fall. History of recent fall with low back pain, initial encounter. FINDINGS: LUMBAR SPINE: BONES: 5 lumbar vertebral bodies are well visualized. Vertebral body height is well maintained, with the exception of the L2 vertebral body which demonstrates approximately 40 percent height loss  anteriorly. No other compression fractures seen. Mildly anterolisthesis of L4 on L5. No aggressive appearing osseous lesion. No pars defects are seen. DISCS AND DEGENERATIVE CHANGES: Mild facet hypertrophic changes and osteophytic changes are seen. No severe degenerative changes. SOFT TISSUES: No acute abnormality. IMPRESSION: 1. Likely Acute L2 compression fracture with approximately 40% anterior height loss, compatible with recent trauma. Nonemergent MRI would be helpful for further clarification. 2. Mild anterolisthesis of L4 on L5 with mild facet hypertrophic and osteophytic changes. Electronically signed by: Oneil Devonshire MD 04/13/2024 01:51 AM EST RP Workstation: [id number]   DG Thoracic Spine 2 View Result Date: 04/13/2024 EXAM: 2 VIEW(S) XRAY OF THE THORACIC SPINE 04/13/2024 01:39:05 AM COMPARISON: None available. CLINICAL HISTORY: pain, fall FINDINGS: BONES: Pedicles  are within normal limits. Vertebral body height is well maintained. No acute fracture. Alignment is normal. DISCS AND DEGENERATIVE CHANGES: Multilevel osteophytic changes are seen. No severe degenerative changes. SOFT TISSUES: No paraspinal mass is seen. The visualized lungs are clear. IMPRESSION: 1. Degenerative change without acute abnormality Electronically signed by: Oneil Devonshire MD 04/13/2024 01:49 AM EST RP Workstation: [id number]    Micro Results     No results found for this or any previous visit (from the past 240 hours).  Today   Subjective    Trentan Hudnall today has no headache,no chest abdominal pain,no new weakness tingling or numbness, feels much better wants to go home today.     Objective   Blood pressure (!) 109/44, pulse 69, temperature 97.8 F (36.6 C), temperature source Oral, resp. rate 19, height 5' 10 (1.778 m), weight 74.8 kg, SpO2 93%.   Intake/Output Summary (Last 24 hours) at 04/21/2024 0851 Last data filed at 04/21/2024 0748 Gross per 24 hour  Intake 500.1 ml  Output 300 ml  Net 200.1 ml    Exam  Awake Alert, No new F.N deficits,    [place].AT,PERRAL Supple Neck,   Symmetrical Chest wall movement, Good air movement bilaterally, CTAB RRR,No Gallops,   +ve B.Sounds, Abd Soft, Non tender,  Much improved morbidly from torso rash   Data Review   Recent Labs  Lab 04/16/24 0419 04/17/24 0507 04/18/24 0415 04/19/24 0347  WBC 8.4 6.4 7.0 6.4  HGB 12.1* 12.0* 12.3* 12.2*  HCT 35.6* 34.6* 36.0* 35.4*  PLT 223 231 261 272  MCV 96.2 95.1 95.0 95.4  MCH 32.7 33.0 32.5 32.9  MCHC 34.0 34.7 34.2 34.5  RDW 12.3 12.2 12.0 12.0  LYMPHSABS  --  1.6 2.1 1.7  MONOABS  --  1.1* 1.3* 1.2*  EOSABS  --  0.2 0.3 0.2  BASOSABS  --  0.0 0.0 0.0    Recent Labs  Lab 04/15/24 0326 04/15/24 0542 04/16/24 0419 04/17/24 0507 04/17/24 0603 04/18/24 0415 04/19/24 0347  NA 136  --  139 140  --  138 137  K 3.7  --  3.9 3.5  --  3.7 3.7  CL 103  --  103 102  --   101 105  CO2 24  --  24 26  --  23 24  ANIONGAP 9  --  12 12  --  14 8  GLUCOSE 117*  --  111* 89  --  91 100*  BUN 27*  --  30* 28*  --  17 17  CREATININE 1.15  --  1.19 1.09  --  1.11 1.04  CRP  --   --   --  4.7*  --   --   --   PROCALCITON  --   --   --  <  0.10  --   --   --   LATICACIDVEN  --  0.8  --   --   --   --   --   BNP  --   --  73.9  --  110.3*  --   --   MG  --   --   --  1.8  --  1.7 1.7  CALCIUM  8.3*  --  8.5* 8.5*  --  8.2* 8.4*    Total Time in preparing paper work, data evaluation and todays exam - 35 minutes  Signature  -    Lavada Stank M.D on 04/21/2024 at 8:51 AM   -  To page go to [email or web address]

## 2024-04-21 NOTE — Plan of Care (Signed)

## 2024-04-21 NOTE — Plan of Care (Signed)
  Problem: Education: Goal: Knowledge of General Education information will improve Description: Including pain rating scale, medication(s)/side effects and non-pharmacologic comfort measures Outcome: Progressing   Problem: Health Behavior/Discharge Planning: Goal: Ability to manage health-related needs will improve Outcome: Progressing   Problem: Clinical Measurements: Goal: Ability to maintain clinical measurements within normal limits will improve Outcome: Progressing Goal: Will remain free from infection Outcome: Progressing Goal: Diagnostic test results will improve Outcome: Progressing Goal: Respiratory complications will improve Outcome: Progressing Goal: Cardiovascular complication will be avoided Outcome: Progressing   Problem: Pain Managment: Goal: General experience of comfort will improve and/or be controlled Outcome: Progressing   Problem: Safety: Goal: Ability to remain free from injury will improve Outcome: Progressing   Problem: Skin Integrity: Goal: Risk for impaired skin integrity will decrease Outcome: Progressing

## 2024-04-21 NOTE — Progress Notes (Signed)
   Palliative Medicine Inpatient Follow Up Note HPI: Patient is a 83 y.o.  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    Palliative care involved to support additional goals of care conversations.  Today's Discussion 04/21/2024  *Please note that this is a verbal dictation therefore any spelling or grammatical errors are due to the Dragon Medical One system interpretation.  I reviewed the chart notes including nursing notes from today, progress notes from today. I also reviewed vital signs, nursing flowsheets, medication administrations record, labs, and imaging.    Oral Intake %:  50% I/O:  (+)176ml Bowel Movements:  Last 12/2 Mobility: With walker  I met with Derrick White this morning. He is awake and alert. He shares that he is not in pain this morning, short of breath, or nauseated.   Created space and opportunity for patient to explore thoughts feelings and fears regarding current medical situation. He shares the hope to regain strength and is optimistic for improvement(s) in the oncoming days at rehabilitation.   Questions and concerns addressed/Palliative Support Provided.   Objective Assessment: Vital Signs Vitals:   04/21/24 0400 04/21/24 0748  BP: 115/60 (!) 109/44  Pulse: 63 69  Resp: 15 19  Temp: 97.7 F (36.5 C) 97.8 F (36.6 C)  SpO2:      Intake/Output Summary (Last 24 hours) at 04/21/2024 9090 Last data filed at 04/21/2024 9251 Gross per 24 hour  Intake 500.1 ml  Output 300 ml  Net 200.1 ml   Last Weight  Most recent update: 04/13/2024  3:22 AM     Weight  74.8 kg (165 lb)            Gen: Elderly Caucasian male chronically ill in appearance HEENT: moist mucous membranes CV: Regular rate and rhythm  PULM: On 2LPM [place], breathing is even and nonlabored  ABD: soft/nontender  EXT: No edema  Neuro: Alert and oriented x3  SUMMARY OF RECOMMENDATIONS   DNAR/DNI   MOST/DNR Form Completed, paper copy placed onto the chart electric copy can be found in [employer]   Goals at this time are to see granddaughters graduation from physician assistant school and to enjoy the holidays with family   Patient and his wife are open outpatient palliative support through [facility]   Plan to transition to skilled nursing today ______________________________________________________________________________________ Derrick White [facility] Palliative Medicine Team Team Cell Phone: [phone] Please utilize secure chat with additional questions, if there is no response within 30 minutes please call the above phone number  Time Spent:27  Palliative Medicine Team providers are available by phone from 7am to 7pm daily and can be reached through the team cell phone.  Should this patient require assistance outside of these hours, please call the patient's attending physician.

## 2024-04-21 NOTE — TOC Transition Note (Signed)
 Transition of Care [facility]) - Discharge Note   Patient Details  Name: Derrick White MRN: [id number] Date of Birth: January 07, 1941  [facility] CM/SW Contact:  Luann SHAUNNA Cumming, LCSW Phone Number: 04/21/2024, 11:07 AM   Clinical Narrative:      Per MD patient ready for DC to [facility].  RN, patient, patient's family, and facility notified of DC. Discharge Summary and FL2 sent to facility. RN to call report prior to discharge [phone]). DC packet on chart. Ambulance transport requested for patient.   CSW will sign off for now as social work intervention is no longer needed. Please consult us  again if new needs arise.   Final next level of care: Skilled Nursing Facility Barriers to Discharge: No Barriers Identified   Patient Goals and CMS Choice Patient states their goals for this hospitalization and ongoing recovery are:: Rehab CMS Medicare.gov Compare Post Acute Care list provided to:: Patient Represenative (must comment) Choice offered to / list presented to : Spouse [place] ownership interest in [facility].provided to:: Spouse    Discharge Placement              Patient chooses bed at: [facility] Patient to be transferred to facility by: [facility] Name of family member notified: Constantine Ruddick Patient and family notified of of transfer: 04/21/24  Discharge Plan and Services Additional resources added to the After Visit Summary for   In-house Referral: Clinical Social Work   Post Acute Care Choice: Skilled Nursing Facility                               Social Drivers of Health (SDOH) Interventions SDOH Screenings   Food Insecurity: No Food Insecurity (04/19/2024)  Housing: Low Risk  (04/19/2024)  Transportation Needs: No Transportation Needs (04/19/2024)  Utilities: Not At Risk (04/19/2024)  Social Connections: Socially Integrated (04/19/2024)  Tobacco Use: Medium Risk (03/24/2024)     Readmission Risk Interventions     No data to display

## 2024-04-21 NOTE — Progress Notes (Signed)
 Derrick White 5W19 [facility] Liaison Note:  Notified by [facility] of patient/family request for [facility] Palliative services at home after discharge. Please call with any hospice or outpatient palliative care related questions. Thank you for the opportunity to participate in this patient's care.  Eleanor Nail, LPN [facility] Liaison [phone]

## 2024-04-22 DIAGNOSIS — I480 Paroxysmal atrial fibrillation: Secondary | ICD-10-CM | POA: Diagnosis not present

## 2024-04-22 DIAGNOSIS — S32020D Wedge compression fracture of second lumbar vertebra, subsequent encounter for fracture with routine healing: Secondary | ICD-10-CM | POA: Diagnosis not present

## 2024-04-22 DIAGNOSIS — I10 Essential (primary) hypertension: Secondary | ICD-10-CM | POA: Diagnosis not present

## 2024-04-22 DIAGNOSIS — E785 Hyperlipidemia, unspecified: Secondary | ICD-10-CM | POA: Diagnosis not present

## 2024-05-19 ENCOUNTER — Other Ambulatory Visit: Payer: Self-pay | Admitting: Internal Medicine

## 2024-05-24 ENCOUNTER — Telehealth: Payer: Self-pay | Admitting: Neurology

## 2024-05-24 NOTE — Telephone Encounter (Signed)
 Pt called to Cancel appt due to not needing appt anymore   Appt Canceled

## 2024-06-19 ENCOUNTER — Other Ambulatory Visit: Payer: Self-pay | Admitting: Internal Medicine

## 2024-06-28 ENCOUNTER — Ambulatory Visit: Payer: HMO | Admitting: Neurology
